# Patient Record
Sex: Female | Born: 1940
Health system: Southern US, Community
[De-identification: ages and names within clinical notes are randomized; demographics above are authoritative.]

## PROBLEM LIST (undated history)

## (undated) DIAGNOSIS — K219 Gastro-esophageal reflux disease without esophagitis: Secondary | ICD-10-CM

## (undated) DIAGNOSIS — E1142 Type 2 diabetes mellitus with diabetic polyneuropathy: Secondary | ICD-10-CM

## (undated) DIAGNOSIS — E669 Obesity, unspecified: Secondary | ICD-10-CM

## (undated) DIAGNOSIS — M722 Plantar fascial fibromatosis: Secondary | ICD-10-CM

## (undated) DIAGNOSIS — E0590 Thyrotoxicosis, unspecified without thyrotoxic crisis or storm: Secondary | ICD-10-CM

## (undated) DIAGNOSIS — L719 Rosacea, unspecified: Secondary | ICD-10-CM

## (undated) DIAGNOSIS — Z86718 Personal history of other venous thrombosis and embolism: Secondary | ICD-10-CM

## (undated) DIAGNOSIS — R269 Unspecified abnormalities of gait and mobility: Secondary | ICD-10-CM

## (undated) DIAGNOSIS — G4700 Insomnia, unspecified: Secondary | ICD-10-CM

## (undated) DIAGNOSIS — D496 Neoplasm of unspecified behavior of brain: Secondary | ICD-10-CM

## (undated) DIAGNOSIS — M159 Polyosteoarthritis, unspecified: Secondary | ICD-10-CM

## (undated) DIAGNOSIS — H33312 Horseshoe tear of retina without detachment, left eye: Secondary | ICD-10-CM

## (undated) DIAGNOSIS — D259 Leiomyoma of uterus, unspecified: Secondary | ICD-10-CM

## (undated) DIAGNOSIS — I10 Essential (primary) hypertension: Secondary | ICD-10-CM

## (undated) DIAGNOSIS — R2689 Other abnormalities of gait and mobility: Secondary | ICD-10-CM

## (undated) DIAGNOSIS — I480 Paroxysmal atrial fibrillation: Secondary | ICD-10-CM

## (undated) DIAGNOSIS — Z9289 Personal history of other medical treatment: Secondary | ICD-10-CM

## (undated) HISTORY — DX: Personal history of other medical treatment: Z92.89

## (undated) HISTORY — DX: Obesity, unspecified: E66.9

## (undated) HISTORY — PX: CATARACT EXTRACTION: SUR2

## (undated) HISTORY — PX: CARPAL TUNNEL RELEASE: SHX101

## (undated) HISTORY — DX: Plantar fascial fibromatosis: M72.2

## (undated) HISTORY — DX: Horseshoe tear of retina without detachment, left eye: H33.312

## (undated) HISTORY — DX: Leiomyoma of uterus, unspecified: D25.9

## (undated) HISTORY — DX: Gastro-esophageal reflux disease without esophagitis: K21.9

## (undated) HISTORY — DX: Thyrotoxicosis, unspecified without thyrotoxic crisis or storm: E05.90

## (undated) HISTORY — DX: Polyosteoarthritis, unspecified: M15.9

## (undated) HISTORY — DX: Essential (primary) hypertension: I10

## (undated) HISTORY — DX: Insomnia, unspecified: G47.00

## (undated) HISTORY — DX: Paroxysmal atrial fibrillation: I48.0

## (undated) HISTORY — DX: Neoplasm of unspecified behavior of brain: D49.6

## (undated) HISTORY — PX: RETINAL TEAR REPAIR CRYOTHERAPY: SHX5304

## (undated) HISTORY — DX: Rosacea, unspecified: L71.9

## (undated) HISTORY — DX: Other abnormalities of gait and mobility: R26.89

## (undated) HISTORY — DX: Type 2 diabetes mellitus with diabetic polyneuropathy: E11.42

## (undated) HISTORY — DX: Unspecified abnormalities of gait and mobility: R26.9

## (undated) HISTORY — DX: Personal history of other venous thrombosis and embolism: Z86.718

---

## 1997-11-05 ENCOUNTER — Ambulatory Visit (HOSPITAL_COMMUNITY): Admission: RE | Admit: 1997-11-05 | Discharge: 1997-11-05 | Payer: Self-pay | Admitting: Obstetrics & Gynecology

## 1998-06-17 ENCOUNTER — Other Ambulatory Visit: Admission: RE | Admit: 1998-06-17 | Discharge: 1998-06-17 | Payer: Self-pay | Admitting: *Deleted

## 1999-05-19 ENCOUNTER — Ambulatory Visit (HOSPITAL_COMMUNITY): Admission: RE | Admit: 1999-05-19 | Discharge: 1999-05-19 | Payer: Self-pay | Admitting: Pulmonary Disease

## 1999-07-21 ENCOUNTER — Other Ambulatory Visit: Admission: RE | Admit: 1999-07-21 | Discharge: 1999-07-21 | Payer: Self-pay | Admitting: *Deleted

## 2000-08-14 ENCOUNTER — Other Ambulatory Visit: Admission: RE | Admit: 2000-08-14 | Discharge: 2000-08-14 | Payer: Self-pay | Admitting: *Deleted

## 2001-11-01 ENCOUNTER — Other Ambulatory Visit: Admission: RE | Admit: 2001-11-01 | Discharge: 2001-11-01 | Payer: Self-pay | Admitting: *Deleted

## 2002-11-15 ENCOUNTER — Other Ambulatory Visit: Admission: RE | Admit: 2002-11-15 | Discharge: 2002-11-15 | Payer: Self-pay | Admitting: *Deleted

## 2003-02-14 ENCOUNTER — Ambulatory Visit (HOSPITAL_COMMUNITY): Admission: RE | Admit: 2003-02-14 | Discharge: 2003-02-14 | Payer: Self-pay | Admitting: *Deleted

## 2004-02-16 ENCOUNTER — Other Ambulatory Visit: Admission: RE | Admit: 2004-02-16 | Discharge: 2004-02-16 | Payer: Self-pay | Admitting: *Deleted

## 2005-01-25 ENCOUNTER — Encounter: Admission: RE | Admit: 2005-01-25 | Discharge: 2005-04-25 | Payer: Self-pay | Admitting: *Deleted

## 2005-03-01 ENCOUNTER — Other Ambulatory Visit: Admission: RE | Admit: 2005-03-01 | Discharge: 2005-03-01 | Payer: Self-pay | Admitting: *Deleted

## 2006-03-03 ENCOUNTER — Other Ambulatory Visit: Admission: RE | Admit: 2006-03-03 | Discharge: 2006-03-03 | Payer: Self-pay | Admitting: Obstetrics & Gynecology

## 2007-03-14 ENCOUNTER — Other Ambulatory Visit: Admission: RE | Admit: 2007-03-14 | Discharge: 2007-03-14 | Payer: Self-pay | Admitting: Obstetrics & Gynecology

## 2009-10-26 ENCOUNTER — Encounter: Admission: RE | Admit: 2009-10-26 | Discharge: 2009-10-26 | Payer: Self-pay | Admitting: Family Medicine

## 2009-11-01 ENCOUNTER — Emergency Department (HOSPITAL_COMMUNITY): Admission: EM | Admit: 2009-11-01 | Discharge: 2009-11-01 | Payer: Self-pay | Admitting: Emergency Medicine

## 2009-11-06 ENCOUNTER — Encounter: Admission: RE | Admit: 2009-11-06 | Discharge: 2009-11-06 | Payer: Self-pay | Admitting: Family Medicine

## 2010-06-15 ENCOUNTER — Encounter: Admission: RE | Admit: 2010-06-15 | Discharge: 2010-07-15 | Payer: Self-pay | Admitting: Family Medicine

## 2010-12-16 LAB — BASIC METABOLIC PANEL
BUN: 16 mg/dL (ref 6–23)
CO2: 29 mEq/L (ref 19–32)
Calcium: 9.6 mg/dL (ref 8.4–10.5)
Chloride: 102 mEq/L (ref 96–112)
Creatinine, Ser: 0.76 mg/dL (ref 0.4–1.2)
GFR calc Af Amer: >60 mL/min (ref >60)
GFR calc non Af Amer: >60 mL/min (ref >60)
Glucose, Bld: 148 mg/dL — ABNORMAL HIGH (ref 70–99)
Potassium: 3.7 mEq/L (ref 3.5–5.1)
Sodium: 140 mEq/L (ref 135–145)

## 2010-12-16 LAB — POCT CARDIAC MARKERS
CKMB, poc: 1.6 ng/mL (ref 1.0–8.0)
Myoglobin, poc: 50.1 ng/mL (ref 12–200)
Troponin i, poc: <0.05 ng/mL (ref 0.00–0.09)

## 2011-10-12 DIAGNOSIS — B9789 Other viral agents as the cause of diseases classified elsewhere: Secondary | ICD-10-CM | POA: Diagnosis not present

## 2011-10-27 DIAGNOSIS — Z79899 Other long term (current) drug therapy: Secondary | ICD-10-CM | POA: Diagnosis not present

## 2011-10-27 DIAGNOSIS — R1031 Right lower quadrant pain: Secondary | ICD-10-CM | POA: Diagnosis not present

## 2011-10-27 DIAGNOSIS — N390 Urinary tract infection, site not specified: Secondary | ICD-10-CM | POA: Diagnosis not present

## 2011-10-27 DIAGNOSIS — IMO0001 Reserved for inherently not codable concepts without codable children: Secondary | ICD-10-CM | POA: Diagnosis not present

## 2011-11-01 DIAGNOSIS — E119 Type 2 diabetes mellitus without complications: Secondary | ICD-10-CM | POA: Diagnosis not present

## 2011-11-01 DIAGNOSIS — I10 Essential (primary) hypertension: Secondary | ICD-10-CM | POA: Diagnosis not present

## 2011-11-01 DIAGNOSIS — R059 Cough, unspecified: Secondary | ICD-10-CM | POA: Diagnosis not present

## 2012-01-04 DIAGNOSIS — J301 Allergic rhinitis due to pollen: Secondary | ICD-10-CM | POA: Diagnosis not present

## 2012-01-04 DIAGNOSIS — K219 Gastro-esophageal reflux disease without esophagitis: Secondary | ICD-10-CM | POA: Diagnosis not present

## 2012-01-04 DIAGNOSIS — R490 Dysphonia: Secondary | ICD-10-CM | POA: Diagnosis not present

## 2012-01-18 DIAGNOSIS — K219 Gastro-esophageal reflux disease without esophagitis: Secondary | ICD-10-CM | POA: Diagnosis not present

## 2012-01-18 DIAGNOSIS — J301 Allergic rhinitis due to pollen: Secondary | ICD-10-CM | POA: Diagnosis not present

## 2012-01-18 DIAGNOSIS — R490 Dysphonia: Secondary | ICD-10-CM | POA: Diagnosis not present

## 2012-02-28 DIAGNOSIS — R490 Dysphonia: Secondary | ICD-10-CM | POA: Diagnosis not present

## 2012-02-28 DIAGNOSIS — K219 Gastro-esophageal reflux disease without esophagitis: Secondary | ICD-10-CM | POA: Diagnosis not present

## 2012-03-07 DIAGNOSIS — IMO0001 Reserved for inherently not codable concepts without codable children: Secondary | ICD-10-CM | POA: Diagnosis not present

## 2012-03-26 DIAGNOSIS — L570 Actinic keratosis: Secondary | ICD-10-CM | POA: Diagnosis not present

## 2012-03-26 DIAGNOSIS — L719 Rosacea, unspecified: Secondary | ICD-10-CM | POA: Diagnosis not present

## 2012-03-26 DIAGNOSIS — D235 Other benign neoplasm of skin of trunk: Secondary | ICD-10-CM | POA: Diagnosis not present

## 2012-05-29 DIAGNOSIS — Z1231 Encounter for screening mammogram for malignant neoplasm of breast: Secondary | ICD-10-CM | POA: Diagnosis not present

## 2012-06-21 DIAGNOSIS — E119 Type 2 diabetes mellitus without complications: Secondary | ICD-10-CM | POA: Diagnosis not present

## 2012-06-21 DIAGNOSIS — E782 Mixed hyperlipidemia: Secondary | ICD-10-CM | POA: Diagnosis not present

## 2012-08-06 DIAGNOSIS — K219 Gastro-esophageal reflux disease without esophagitis: Secondary | ICD-10-CM | POA: Diagnosis not present

## 2012-08-06 DIAGNOSIS — J301 Allergic rhinitis due to pollen: Secondary | ICD-10-CM | POA: Diagnosis not present

## 2012-08-06 DIAGNOSIS — R490 Dysphonia: Secondary | ICD-10-CM | POA: Diagnosis not present

## 2012-08-13 DIAGNOSIS — R05 Cough: Secondary | ICD-10-CM | POA: Diagnosis not present

## 2012-08-13 DIAGNOSIS — R059 Cough, unspecified: Secondary | ICD-10-CM | POA: Diagnosis not present

## 2012-08-20 DIAGNOSIS — K219 Gastro-esophageal reflux disease without esophagitis: Secondary | ICD-10-CM | POA: Diagnosis not present

## 2012-08-20 DIAGNOSIS — J310 Chronic rhinitis: Secondary | ICD-10-CM | POA: Diagnosis not present

## 2012-08-20 DIAGNOSIS — R059 Cough, unspecified: Secondary | ICD-10-CM | POA: Diagnosis not present

## 2012-08-20 DIAGNOSIS — R002 Palpitations: Secondary | ICD-10-CM | POA: Diagnosis not present

## 2012-08-20 DIAGNOSIS — R05 Cough: Secondary | ICD-10-CM | POA: Diagnosis not present

## 2012-08-28 DIAGNOSIS — Z124 Encounter for screening for malignant neoplasm of cervix: Secondary | ICD-10-CM | POA: Diagnosis not present

## 2012-08-28 DIAGNOSIS — Z01419 Encounter for gynecological examination (general) (routine) without abnormal findings: Secondary | ICD-10-CM | POA: Diagnosis not present

## 2012-09-04 DIAGNOSIS — R490 Dysphonia: Secondary | ICD-10-CM | POA: Diagnosis not present

## 2012-09-10 DIAGNOSIS — E119 Type 2 diabetes mellitus without complications: Secondary | ICD-10-CM | POA: Diagnosis not present

## 2012-10-23 DIAGNOSIS — Z79899 Other long term (current) drug therapy: Secondary | ICD-10-CM | POA: Diagnosis not present

## 2012-10-23 DIAGNOSIS — H9319 Tinnitus, unspecified ear: Secondary | ICD-10-CM | POA: Diagnosis not present

## 2012-10-23 DIAGNOSIS — E782 Mixed hyperlipidemia: Secondary | ICD-10-CM | POA: Diagnosis not present

## 2012-10-23 DIAGNOSIS — M2550 Pain in unspecified joint: Secondary | ICD-10-CM | POA: Diagnosis not present

## 2012-10-23 DIAGNOSIS — E119 Type 2 diabetes mellitus without complications: Secondary | ICD-10-CM | POA: Diagnosis not present

## 2012-10-23 DIAGNOSIS — Z23 Encounter for immunization: Secondary | ICD-10-CM | POA: Diagnosis not present

## 2012-10-23 DIAGNOSIS — G479 Sleep disorder, unspecified: Secondary | ICD-10-CM | POA: Diagnosis not present

## 2012-10-25 DIAGNOSIS — H33309 Unspecified retinal break, unspecified eye: Secondary | ICD-10-CM | POA: Diagnosis not present

## 2012-10-25 DIAGNOSIS — H43819 Vitreous degeneration, unspecified eye: Secondary | ICD-10-CM | POA: Diagnosis not present

## 2012-10-26 DIAGNOSIS — H9319 Tinnitus, unspecified ear: Secondary | ICD-10-CM | POA: Diagnosis not present

## 2012-10-26 DIAGNOSIS — R42 Dizziness and giddiness: Secondary | ICD-10-CM | POA: Diagnosis not present

## 2012-10-26 DIAGNOSIS — H903 Sensorineural hearing loss, bilateral: Secondary | ICD-10-CM | POA: Diagnosis not present

## 2012-11-02 DIAGNOSIS — H9319 Tinnitus, unspecified ear: Secondary | ICD-10-CM | POA: Diagnosis not present

## 2012-11-29 DIAGNOSIS — M76899 Other specified enthesopathies of unspecified lower limb, excluding foot: Secondary | ICD-10-CM | POA: Diagnosis not present

## 2012-11-29 DIAGNOSIS — M25559 Pain in unspecified hip: Secondary | ICD-10-CM | POA: Diagnosis not present

## 2012-12-17 DIAGNOSIS — M25559 Pain in unspecified hip: Secondary | ICD-10-CM | POA: Diagnosis not present

## 2012-12-17 DIAGNOSIS — M76899 Other specified enthesopathies of unspecified lower limb, excluding foot: Secondary | ICD-10-CM | POA: Diagnosis not present

## 2012-12-19 DIAGNOSIS — M25559 Pain in unspecified hip: Secondary | ICD-10-CM | POA: Diagnosis not present

## 2012-12-19 DIAGNOSIS — M76899 Other specified enthesopathies of unspecified lower limb, excluding foot: Secondary | ICD-10-CM | POA: Diagnosis not present

## 2012-12-31 DIAGNOSIS — L570 Actinic keratosis: Secondary | ICD-10-CM | POA: Diagnosis not present

## 2012-12-31 DIAGNOSIS — L821 Other seborrheic keratosis: Secondary | ICD-10-CM | POA: Diagnosis not present

## 2012-12-31 DIAGNOSIS — D235 Other benign neoplasm of skin of trunk: Secondary | ICD-10-CM | POA: Diagnosis not present

## 2013-01-09 DIAGNOSIS — E782 Mixed hyperlipidemia: Secondary | ICD-10-CM | POA: Diagnosis not present

## 2013-01-09 DIAGNOSIS — Z79899 Other long term (current) drug therapy: Secondary | ICD-10-CM | POA: Diagnosis not present

## 2013-01-14 DIAGNOSIS — M545 Low back pain, unspecified: Secondary | ICD-10-CM | POA: Diagnosis not present

## 2013-01-14 DIAGNOSIS — M25559 Pain in unspecified hip: Secondary | ICD-10-CM | POA: Diagnosis not present

## 2013-01-15 ENCOUNTER — Other Ambulatory Visit: Payer: Self-pay | Admitting: Family Medicine

## 2013-01-15 DIAGNOSIS — M545 Low back pain, unspecified: Secondary | ICD-10-CM

## 2013-01-22 ENCOUNTER — Other Ambulatory Visit: Payer: Self-pay

## 2013-01-25 ENCOUNTER — Ambulatory Visit
Admission: RE | Admit: 2013-01-25 | Discharge: 2013-01-25 | Disposition: A | Discharge status: Home / Self Care | Payer: Medicare Other | Source: Ambulatory Visit | Attending: Family Medicine | Admitting: Family Medicine

## 2013-01-25 DIAGNOSIS — M5126 Other intervertebral disc displacement, lumbar region: Secondary | ICD-10-CM | POA: Diagnosis not present

## 2013-01-25 DIAGNOSIS — M545 Low back pain, unspecified: Secondary | ICD-10-CM

## 2013-01-25 DIAGNOSIS — M47817 Spondylosis without myelopathy or radiculopathy, lumbosacral region: Secondary | ICD-10-CM | POA: Diagnosis not present

## 2013-01-25 DIAGNOSIS — M4310 Spondylolisthesis, site unspecified: Secondary | ICD-10-CM | POA: Diagnosis not present

## 2013-02-04 DIAGNOSIS — M76899 Other specified enthesopathies of unspecified lower limb, excluding foot: Secondary | ICD-10-CM | POA: Diagnosis not present

## 2013-02-04 DIAGNOSIS — M5137 Other intervertebral disc degeneration, lumbosacral region: Secondary | ICD-10-CM | POA: Diagnosis not present

## 2013-02-07 DIAGNOSIS — M76899 Other specified enthesopathies of unspecified lower limb, excluding foot: Secondary | ICD-10-CM | POA: Diagnosis not present

## 2013-02-07 DIAGNOSIS — M5137 Other intervertebral disc degeneration, lumbosacral region: Secondary | ICD-10-CM | POA: Diagnosis not present

## 2013-02-10 DIAGNOSIS — L03119 Cellulitis of unspecified part of limb: Secondary | ICD-10-CM | POA: Diagnosis not present

## 2013-02-10 DIAGNOSIS — L02419 Cutaneous abscess of limb, unspecified: Secondary | ICD-10-CM | POA: Diagnosis not present

## 2013-02-19 DIAGNOSIS — S81009A Unspecified open wound, unspecified knee, initial encounter: Secondary | ICD-10-CM | POA: Diagnosis not present

## 2013-02-19 DIAGNOSIS — S91009A Unspecified open wound, unspecified ankle, initial encounter: Secondary | ICD-10-CM | POA: Diagnosis not present

## 2013-04-03 DIAGNOSIS — E782 Mixed hyperlipidemia: Secondary | ICD-10-CM | POA: Diagnosis not present

## 2013-04-03 DIAGNOSIS — E119 Type 2 diabetes mellitus without complications: Secondary | ICD-10-CM | POA: Diagnosis not present

## 2013-04-17 DIAGNOSIS — E119 Type 2 diabetes mellitus without complications: Secondary | ICD-10-CM | POA: Diagnosis not present

## 2013-05-15 DIAGNOSIS — M25559 Pain in unspecified hip: Secondary | ICD-10-CM | POA: Diagnosis not present

## 2013-05-15 DIAGNOSIS — M545 Low back pain, unspecified: Secondary | ICD-10-CM | POA: Diagnosis not present

## 2013-05-21 ENCOUNTER — Ambulatory Visit: Payer: Medicare Other | Attending: Family Medicine | Admitting: Physical Therapy

## 2013-05-21 DIAGNOSIS — M545 Low back pain, unspecified: Secondary | ICD-10-CM | POA: Insufficient documentation

## 2013-05-21 DIAGNOSIS — M25569 Pain in unspecified knee: Secondary | ICD-10-CM | POA: Insufficient documentation

## 2013-05-21 DIAGNOSIS — M25559 Pain in unspecified hip: Secondary | ICD-10-CM | POA: Insufficient documentation

## 2013-05-21 DIAGNOSIS — IMO0001 Reserved for inherently not codable concepts without codable children: Secondary | ICD-10-CM | POA: Insufficient documentation

## 2013-05-23 ENCOUNTER — Ambulatory Visit: Payer: Medicare Other | Admitting: Physical Therapy

## 2013-05-28 ENCOUNTER — Ambulatory Visit: Payer: Medicare Other | Attending: Family Medicine | Admitting: Physical Therapy

## 2013-05-28 DIAGNOSIS — M25559 Pain in unspecified hip: Secondary | ICD-10-CM | POA: Insufficient documentation

## 2013-05-28 DIAGNOSIS — IMO0001 Reserved for inherently not codable concepts without codable children: Secondary | ICD-10-CM | POA: Insufficient documentation

## 2013-05-28 DIAGNOSIS — M545 Low back pain, unspecified: Secondary | ICD-10-CM | POA: Insufficient documentation

## 2013-05-28 DIAGNOSIS — M25569 Pain in unspecified knee: Secondary | ICD-10-CM | POA: Diagnosis not present

## 2013-05-29 ENCOUNTER — Encounter: Payer: Medicare Other | Admitting: Physical Therapy

## 2013-05-30 ENCOUNTER — Ambulatory Visit: Payer: Medicare Other | Admitting: Physical Therapy

## 2013-06-04 ENCOUNTER — Ambulatory Visit: Payer: Medicare Other | Admitting: Physical Therapy

## 2013-06-06 ENCOUNTER — Ambulatory Visit: Payer: Medicare Other | Admitting: Physical Therapy

## 2013-06-10 ENCOUNTER — Ambulatory Visit: Payer: Medicare Other | Admitting: Physical Therapy

## 2013-06-11 ENCOUNTER — Telehealth: Payer: Self-pay | Admitting: Orthopedic Surgery

## 2013-06-11 NOTE — Telephone Encounter (Signed)
LM on pt's VM confirming name that she has been approved by ins co for vivelle dot patch, good through 09-25-13. Advised she can get refilled at pharmacy. Pt to call back if she needs more refills sent in.

## 2013-06-12 ENCOUNTER — Ambulatory Visit: Payer: Medicare Other | Admitting: Physical Therapy

## 2013-06-13 DIAGNOSIS — H33309 Unspecified retinal break, unspecified eye: Secondary | ICD-10-CM | POA: Diagnosis not present

## 2013-06-13 NOTE — Telephone Encounter (Signed)
Patient calling for clarification. States that she picks up medications from [facility] and has no issues.

## 2013-06-17 ENCOUNTER — Telehealth: Payer: Self-pay | Admitting: Emergency Medicine

## 2013-06-17 NOTE — Telephone Encounter (Signed)
Calling patient to notify that Prior Authorization has been approved by [facility] for Vivelle Dot 0.025 mg Patches.  No answer on phone provider. Will have to try again.

## 2013-06-18 ENCOUNTER — Encounter: Payer: Medicare Other | Admitting: Physical Therapy

## 2013-06-20 ENCOUNTER — Ambulatory Visit: Payer: Medicare Other | Admitting: Physical Therapy

## 2013-06-25 ENCOUNTER — Ambulatory Visit: Payer: Medicare Other | Admitting: Physical Therapy

## 2013-06-27 ENCOUNTER — Ambulatory Visit: Payer: Medicare Other | Admitting: Physical Therapy

## 2013-07-17 ENCOUNTER — Ambulatory Visit: Payer: Medicare Other | Attending: Family Medicine | Admitting: Physical Therapy

## 2013-07-17 ENCOUNTER — Ambulatory Visit: Payer: Medicare Other | Admitting: Physical Therapy

## 2013-07-17 DIAGNOSIS — M545 Low back pain, unspecified: Secondary | ICD-10-CM | POA: Diagnosis not present

## 2013-07-17 DIAGNOSIS — M25559 Pain in unspecified hip: Secondary | ICD-10-CM | POA: Insufficient documentation

## 2013-07-17 DIAGNOSIS — M25569 Pain in unspecified knee: Secondary | ICD-10-CM | POA: Diagnosis not present

## 2013-07-17 DIAGNOSIS — IMO0001 Reserved for inherently not codable concepts without codable children: Secondary | ICD-10-CM | POA: Insufficient documentation

## 2013-07-17 DIAGNOSIS — I10 Essential (primary) hypertension: Secondary | ICD-10-CM | POA: Diagnosis not present

## 2013-07-19 DIAGNOSIS — I10 Essential (primary) hypertension: Secondary | ICD-10-CM | POA: Diagnosis not present

## 2013-07-19 DIAGNOSIS — E7800 Pure hypercholesterolemia, unspecified: Secondary | ICD-10-CM | POA: Diagnosis not present

## 2013-07-19 DIAGNOSIS — Z1331 Encounter for screening for depression: Secondary | ICD-10-CM | POA: Diagnosis not present

## 2013-07-19 DIAGNOSIS — Z23 Encounter for immunization: Secondary | ICD-10-CM | POA: Diagnosis not present

## 2013-07-19 DIAGNOSIS — E119 Type 2 diabetes mellitus without complications: Secondary | ICD-10-CM | POA: Diagnosis not present

## 2013-10-28 DIAGNOSIS — Z1231 Encounter for screening mammogram for malignant neoplasm of breast: Secondary | ICD-10-CM | POA: Diagnosis not present

## 2013-10-28 DIAGNOSIS — E119 Type 2 diabetes mellitus without complications: Secondary | ICD-10-CM | POA: Diagnosis not present

## 2013-10-29 DIAGNOSIS — R079 Chest pain, unspecified: Secondary | ICD-10-CM | POA: Diagnosis not present

## 2013-11-12 ENCOUNTER — Other Ambulatory Visit: Payer: Self-pay | Admitting: Obstetrics & Gynecology

## 2013-11-12 NOTE — Telephone Encounter (Signed)
Pt is requesting a refill for Vivelle dot.

## 2013-11-12 NOTE — Telephone Encounter (Signed)
Last AEX and refill 09/07/12 x 1 year Next appt 11/29/13 Last MMG 05/2012  Will refill until appt. 11/29/2013  - LM for patient notifying we sent rx to pharmacy.

## 2013-11-27 ENCOUNTER — Telehealth: Payer: Self-pay

## 2013-11-27 NOTE — Telephone Encounter (Signed)
Patient AEX rescheduled//kn

## 2013-11-27 NOTE — Telephone Encounter (Signed)
lmtcb needs to reschedule AEX due to jury duty//kn

## 2013-11-28 ENCOUNTER — Ambulatory Visit: Payer: Self-pay | Admitting: Obstetrics & Gynecology

## 2013-11-29 ENCOUNTER — Ambulatory Visit: Payer: Self-pay | Admitting: Obstetrics & Gynecology

## 2013-12-09 DIAGNOSIS — H18519 Endothelial corneal dystrophy, unspecified eye: Secondary | ICD-10-CM | POA: Diagnosis not present

## 2013-12-09 DIAGNOSIS — H18599 Other hereditary corneal dystrophies, unspecified eye: Secondary | ICD-10-CM | POA: Diagnosis not present

## 2013-12-09 DIAGNOSIS — H33309 Unspecified retinal break, unspecified eye: Secondary | ICD-10-CM | POA: Diagnosis not present

## 2013-12-09 DIAGNOSIS — E119 Type 2 diabetes mellitus without complications: Secondary | ICD-10-CM | POA: Diagnosis not present

## 2013-12-12 DIAGNOSIS — H33309 Unspecified retinal break, unspecified eye: Secondary | ICD-10-CM | POA: Diagnosis not present

## 2013-12-23 DIAGNOSIS — L570 Actinic keratosis: Secondary | ICD-10-CM | POA: Diagnosis not present

## 2013-12-23 DIAGNOSIS — D235 Other benign neoplasm of skin of trunk: Secondary | ICD-10-CM | POA: Diagnosis not present

## 2013-12-24 ENCOUNTER — Encounter: Payer: Self-pay | Admitting: Obstetrics & Gynecology

## 2013-12-24 ENCOUNTER — Ambulatory Visit (INDEPENDENT_AMBULATORY_CARE_PROVIDER_SITE_OTHER): Payer: Medicare Other | Admitting: Obstetrics & Gynecology

## 2013-12-24 VITALS — BP 140/78 | HR 80 | Resp 20 | Ht 65.75 in | Wt 202.6 lb

## 2013-12-24 DIAGNOSIS — Z01419 Encounter for gynecological examination (general) (routine) without abnormal findings: Secondary | ICD-10-CM | POA: Diagnosis not present

## 2013-12-24 DIAGNOSIS — Z124 Encounter for screening for malignant neoplasm of cervix: Secondary | ICD-10-CM

## 2013-12-24 DIAGNOSIS — H33309 Unspecified retinal break, unspecified eye: Secondary | ICD-10-CM | POA: Diagnosis not present

## 2013-12-24 MED ORDER — ESTRADIOL 0.025 MG/24HR TD PTTW: 1.0000 | MEDICATED_PATCH | TRANSDERMAL | Status: DC

## 2013-12-24 NOTE — Patient Instructions (Signed)

## 2013-12-24 NOTE — Progress Notes (Signed)
73 y.o. [place].Hedger MarriedCaucasianF here for annual exam.  No vaginal bleeding.  Using 1/2 patch Vivelle dot 0.025mg .  Doing well.  Sleeps so much better with this.  Does not want to stop.  Aware of risks of DVT/PE/stroke/breast cancer.    Blood work in February, 2014.  Reports HbA1C was elevated.  Medications changed.  Has follow-up scheduled in April.  Working on some weight loss.    Patient's last menstrual period was 09/26/1997.          Sexually active: no  The current method of family planning is none.    Exercising: yes  walking, [employer] aerobics Smoker:  no  Health Maintenance: Pap:  08/04/11-WNL History of abnormal Pap:  no MMG:  10/28/13-normal, Grade A breast density Colonoscopy:  2004/2005.  Pt knows is due.  Has gotten two letters.  She will call for an appt. BMD:   2008-normal TDaP:  1/14 Screening Labs: PCP, Hb today: PCP, Urine today: PCP   reports that she has quit smoking. She has never used smokeless tobacco. She reports that she drinks about 0.5 ounces of alcohol per week. She reports that she does not use illicit drugs.  Past Medical History  Diagnosis Date  . Uterine fibroid   . Diabetes   . Insomnia   . Rosacea   . Hypertension   . Retinal tear of left eye     Past Surgical History  Procedure Laterality Date  . Cataract extraction    . Retinal tear repair cryotherapy      10/09/13, then again 3/15    Current Outpatient Prescriptions  Medication Sig Dispense Refill  . celecoxib (CELEBREX) 200 MG capsule       . glimepiride (AMARYL) 1 MG tablet       . Hypromellose (GENTEAL OP) Apply to eye.      . metoprolol succinate (TOPROL-XL) 25 MG 24 hr tablet       . Multiple Vitamins-Minerals (MULTIVITAMIN PO) Take by mouth. Centrum Silver chewable      . pioglitazone (ACTOS) 45 MG tablet       . valsartan-hydrochlorothiazide (DIOVAN-HCT) 160-12.5 MG per tablet       . VIVELLE-DOT 0.025 MG/24HR PLACE 1 NEW PATCH TO SKINTWICE A WEEK  8 patch  0  . acetaminophen  (TYLENOL) 500 MG tablet Take 500 mg by mouth every 6 (six) hours as needed.      . cycloSPORINE (RESTASIS) 0.05 % ophthalmic emulsion 1 drop 2 (two) times daily.      . fexofenadine (ALLEGRA) 180 MG tablet Take 180 mg by mouth daily.      Marland Kitchen LOVASTATIN PO Take by mouth.       No current facility-administered medications for this visit.    Family History  Problem Relation Age of Onset  . Diabetes Father   . Diabetes Other     grandmother  . Pancreatic cancer Father     ROS:  Pertinent items are noted in HPI.  Otherwise, a comprehensive ROS was negative.  Exam:   BP 140/78  Pulse 80  Resp 20  Ht 5' 5.75" (1.67 m)  Wt 202 lb 9.6 oz (91.899 kg)  BMI 32.95 kg/m2  LMP 09/26/1997  Weight change:  +13#   Height: 5' 5.75" (167 cm)  Ht Readings from Last 3 Encounters:  12/24/13 5' 5.75" (1.67 m)    General appearance: alert, cooperative and appears stated age Head: Normocephalic, without obvious abnormality, atraumatic Neck: no adenopathy, supple, symmetrical, trachea midline  and thyroid normal to inspection and palpation Lungs: clear to auscultation bilaterally Breasts: normal appearance, no masses or tenderness Heart: regular rate and rhythm Abdomen: soft, non-tender; bowel sounds normal; no masses,  no organomegaly Extremities: extremities normal, atraumatic, no cyanosis or edema Skin: Skin color, texture, turgor normal. No rashes or lesions Lymph nodes: Cervical, supraclavicular, and axillary nodes normal. No abnormal inguinal nodes palpated Neurologic: Grossly normal   Pelvic: External genitalia:  no lesions              Urethra:  normal appearing urethra with no masses, tenderness or lesions              Bartholins and Skenes: normal                 Vagina: normal appearing vagina with normal color and discharge, no lesions              Cervix: no lesions              Pap taken: yes Bimanual Exam:  Uterus:  normal size, contour, position, consistency, mobility,  non-tender              Adnexa: normal adnexa and no mass, fullness, tenderness               Rectovaginal: Confirms               Anus:  normal sphincter tone, no lesions  A:  Well Woman with normal exam PMP, no HRT H/O uterine fibroids DM insomnia  P:   Mammogram yearly Vivelle dot 0.025mg  twice weekly.  Rx to [facility]. pap smear obtained today Labs with PCP return annually or prn  An After Visit Summary was printed and given to the patient.

## 2013-12-26 LAB — IPS PAP SMEAR ONLY

## 2014-01-20 DIAGNOSIS — D128 Benign neoplasm of rectum: Secondary | ICD-10-CM | POA: Diagnosis not present

## 2014-01-20 DIAGNOSIS — Z1211 Encounter for screening for malignant neoplasm of colon: Secondary | ICD-10-CM | POA: Diagnosis not present

## 2014-01-20 DIAGNOSIS — K621 Rectal polyp: Secondary | ICD-10-CM | POA: Diagnosis not present

## 2014-01-20 DIAGNOSIS — D129 Benign neoplasm of anus and anal canal: Secondary | ICD-10-CM | POA: Diagnosis not present

## 2014-01-20 DIAGNOSIS — K620 Anal polyp: Secondary | ICD-10-CM | POA: Diagnosis not present

## 2014-01-20 DIAGNOSIS — K648 Other hemorrhoids: Secondary | ICD-10-CM | POA: Diagnosis not present

## 2014-01-27 DIAGNOSIS — E119 Type 2 diabetes mellitus without complications: Secondary | ICD-10-CM | POA: Diagnosis not present

## 2014-01-30 DIAGNOSIS — Z23 Encounter for immunization: Secondary | ICD-10-CM | POA: Diagnosis not present

## 2014-01-30 DIAGNOSIS — G5600 Carpal tunnel syndrome, unspecified upper limb: Secondary | ICD-10-CM | POA: Diagnosis not present

## 2014-01-30 DIAGNOSIS — E7800 Pure hypercholesterolemia, unspecified: Secondary | ICD-10-CM | POA: Diagnosis not present

## 2014-01-30 DIAGNOSIS — I10 Essential (primary) hypertension: Secondary | ICD-10-CM | POA: Diagnosis not present

## 2014-01-30 DIAGNOSIS — IMO0001 Reserved for inherently not codable concepts without codable children: Secondary | ICD-10-CM | POA: Diagnosis not present

## 2014-02-04 DIAGNOSIS — H33309 Unspecified retinal break, unspecified eye: Secondary | ICD-10-CM | POA: Diagnosis not present

## 2014-02-06 DIAGNOSIS — M25539 Pain in unspecified wrist: Secondary | ICD-10-CM | POA: Diagnosis not present

## 2014-02-13 DIAGNOSIS — R209 Unspecified disturbances of skin sensation: Secondary | ICD-10-CM | POA: Diagnosis not present

## 2014-02-21 DIAGNOSIS — R209 Unspecified disturbances of skin sensation: Secondary | ICD-10-CM | POA: Diagnosis not present

## 2014-03-04 DIAGNOSIS — M159 Polyosteoarthritis, unspecified: Secondary | ICD-10-CM | POA: Diagnosis not present

## 2014-03-04 DIAGNOSIS — G5600 Carpal tunnel syndrome, unspecified upper limb: Secondary | ICD-10-CM | POA: Diagnosis not present

## 2014-03-17 DIAGNOSIS — G5600 Carpal tunnel syndrome, unspecified upper limb: Secondary | ICD-10-CM | POA: Diagnosis not present

## 2014-04-14 DIAGNOSIS — H43819 Vitreous degeneration, unspecified eye: Secondary | ICD-10-CM | POA: Diagnosis not present

## 2014-05-08 DIAGNOSIS — H33339 Multiple defects of retina without detachment, unspecified eye: Secondary | ICD-10-CM | POA: Diagnosis not present

## 2014-05-08 DIAGNOSIS — H53001 Unspecified amblyopia, right eye: Secondary | ICD-10-CM | POA: Insufficient documentation

## 2014-05-08 DIAGNOSIS — Z961 Presence of intraocular lens: Secondary | ICD-10-CM | POA: Insufficient documentation

## 2014-05-08 DIAGNOSIS — H35349 Macular cyst, hole, or pseudohole, unspecified eye: Secondary | ICD-10-CM | POA: Diagnosis not present

## 2014-05-08 DIAGNOSIS — H33332 Multiple defects of retina without detachment, left eye: Secondary | ICD-10-CM | POA: Insufficient documentation

## 2014-05-08 DIAGNOSIS — E119 Type 2 diabetes mellitus without complications: Secondary | ICD-10-CM | POA: Diagnosis not present

## 2014-05-08 DIAGNOSIS — H53009 Unspecified amblyopia, unspecified eye: Secondary | ICD-10-CM | POA: Diagnosis not present

## 2014-05-13 DIAGNOSIS — G5600 Carpal tunnel syndrome, unspecified upper limb: Secondary | ICD-10-CM | POA: Diagnosis not present

## 2014-06-26 DIAGNOSIS — H33321 Round hole, right eye: Secondary | ICD-10-CM | POA: Insufficient documentation

## 2014-07-09 DIAGNOSIS — Z23 Encounter for immunization: Secondary | ICD-10-CM | POA: Diagnosis not present

## 2014-07-28 ENCOUNTER — Encounter: Payer: Self-pay | Admitting: Obstetrics & Gynecology

## 2014-07-29 DIAGNOSIS — E119 Type 2 diabetes mellitus without complications: Secondary | ICD-10-CM | POA: Diagnosis not present

## 2014-07-31 DIAGNOSIS — E119 Type 2 diabetes mellitus without complications: Secondary | ICD-10-CM | POA: Diagnosis not present

## 2014-07-31 DIAGNOSIS — I10 Essential (primary) hypertension: Secondary | ICD-10-CM | POA: Diagnosis not present

## 2014-07-31 DIAGNOSIS — E782 Mixed hyperlipidemia: Secondary | ICD-10-CM | POA: Diagnosis not present

## 2014-07-31 DIAGNOSIS — Z13820 Encounter for screening for osteoporosis: Secondary | ICD-10-CM | POA: Diagnosis not present

## 2014-09-26 DIAGNOSIS — Z86718 Personal history of other venous thrombosis and embolism: Secondary | ICD-10-CM

## 2014-09-26 HISTORY — DX: Personal history of other venous thrombosis and embolism: Z86.718

## 2014-10-20 ENCOUNTER — Telehealth: Payer: Self-pay | Admitting: Obstetrics & Gynecology

## 2014-10-20 NOTE — Telephone Encounter (Signed)
Patient wants to see Dr. Sabra Heck her urine has a foul smell and it has been this way for a while.

## 2014-10-20 NOTE — Telephone Encounter (Signed)
Spoke with patient. Patient states that for several months she has had a strong smell with urination. Denies any discomfort, urinary frequency, burning with urination, back pain, fevers, chills, or irritation. "It just smells really icky. I have had it on my mind and want to come to get it checked out." Appointment scheduled for tomorrow at 12:45pm with Regina Eck CNM. Agreeable to date and time and to see NP.  Cc: Dr.Miller  Routing to provider for final review. Patient agreeable to disposition. Will close encounter

## 2014-10-21 ENCOUNTER — Encounter: Payer: Self-pay | Admitting: Certified Nurse Midwife

## 2014-10-21 ENCOUNTER — Ambulatory Visit (INDEPENDENT_AMBULATORY_CARE_PROVIDER_SITE_OTHER): Payer: Medicare Other | Admitting: Certified Nurse Midwife

## 2014-10-21 VITALS — BP 130/68 | HR 72 | Temp 97.6°F | Resp 16 | Ht 65.75 in | Wt 200.0 lb

## 2014-10-21 DIAGNOSIS — B372 Candidiasis of skin and nail: Secondary | ICD-10-CM | POA: Diagnosis not present

## 2014-10-21 DIAGNOSIS — N9489 Other specified conditions associated with female genital organs and menstrual cycle: Secondary | ICD-10-CM | POA: Diagnosis not present

## 2014-10-21 DIAGNOSIS — R8290 Unspecified abnormal findings in urine: Secondary | ICD-10-CM | POA: Diagnosis not present

## 2014-10-21 DIAGNOSIS — N898 Other specified noninflammatory disorders of vagina: Secondary | ICD-10-CM

## 2014-10-21 LAB — POCT URINALYSIS DIPSTICK
Bilirubin, UA: NEGATIVE
Blood, UA: NEGATIVE
Glucose, UA: NEGATIVE
Ketones, UA: NEGATIVE
Leukocytes, UA: NEGATIVE
Nitrite, UA: NEGATIVE
Protein, UA: NEGATIVE
Urobilinogen, UA: NEGATIVE
pH, UA: 5

## 2014-10-21 MED ORDER — NYSTATIN 100000 UNIT/GM EX CREA: 1.0000 "application " | TOPICAL_CREAM | Freq: Two times a day (BID) | CUTANEOUS | Status: DC

## 2014-10-21 NOTE — Progress Notes (Signed)
74 y.o. married white female [phone] here with complaint of odor in vaginal or urinary area, with onset  on 6 months ago. Patient has not changed soaps uses [employer] for years. Notices odor after urination. Patient wears pads daily to make sure she is covered if needed, but no incontinence.. Patient denies any urinary frequency/urgency or pain with urination. Patient denies fever, chills, nausea or back pain. No new personal products. Patient feels not related to sexual activity. Denies any vaginal symptoms. Menopausal with no vaginal dryness uses Olive Oil with good response. Patient does not odor on pad. No bowel issues.   O: Healthy female WDWN Affect: Normal, orientation x 3 Skin : warm and dry CVAT: negative bilateral Abdomen: non tender for suprapubic tenderness  Pelvic exam: External genital area: normal, no lesions, in folds of groin scaling and white exudate noted wet prep taken Bladder,Urethra, Urethral meatus:not tender, no leaking with cough Vagina: normal vaginal discharge, normal appearance ph 4.5  Wet prep taken Cervix: normal, non tender Uterus:normal,non tender Adnexa: normal non tender, no fullness or masses  POCT urine: negative, no odor noted Wet prep vagina: negative for pathogens Wet prep: groin area positive for yeast   A:Normal pelvic exam No symptoms of UTI or incontinence Vaginal dryness uses Olive oil Wet prep in groin area positive for yeast  P: Reviewed findings of normal pelvic exam and no evidence of odor she is smelling.Discussed going without pads unless needed, to see if this will help. Decrease coffee use and increase water to see if this changes urine smell. Lab: Urine culture Discussed findings of yeast dermatitis probably from pad use. Rx Nystatin cream see order with instruction. Encouraged to run underwear through the rinse cycle twice to make sure no detergent residue, patient feels this might be the problem, due to highly scented.Patient to  advise if status changes with urinary symptoms.  NAT:FTDDU culture Reviewed warning signs and symptoms of UTI Encouraged to limit soda, tea, and coffee RV prn

## 2014-10-22 LAB — URINE CULTURE
Colony Count: NO GROWTH
Organism ID, Bacteria: NO GROWTH

## 2014-10-23 NOTE — Progress Notes (Signed)
Reviewed personally.  M. Suzanne Elyza Whitt, MD.  

## 2014-11-17 DIAGNOSIS — Z1231 Encounter for screening mammogram for malignant neoplasm of breast: Secondary | ICD-10-CM | POA: Diagnosis not present

## 2014-12-22 DIAGNOSIS — M25512 Pain in left shoulder: Secondary | ICD-10-CM | POA: Diagnosis not present

## 2015-01-27 DIAGNOSIS — R7309 Other abnormal glucose: Secondary | ICD-10-CM | POA: Diagnosis not present

## 2015-01-27 DIAGNOSIS — Z13820 Encounter for screening for osteoporosis: Secondary | ICD-10-CM | POA: Diagnosis not present

## 2015-01-27 DIAGNOSIS — E782 Mixed hyperlipidemia: Secondary | ICD-10-CM | POA: Diagnosis not present

## 2015-01-27 DIAGNOSIS — E1165 Type 2 diabetes mellitus with hyperglycemia: Secondary | ICD-10-CM | POA: Diagnosis not present

## 2015-01-27 DIAGNOSIS — E119 Type 2 diabetes mellitus without complications: Secondary | ICD-10-CM | POA: Diagnosis not present

## 2015-01-27 DIAGNOSIS — I10 Essential (primary) hypertension: Secondary | ICD-10-CM | POA: Diagnosis not present

## 2015-01-29 DIAGNOSIS — E119 Type 2 diabetes mellitus without complications: Secondary | ICD-10-CM | POA: Diagnosis not present

## 2015-01-29 DIAGNOSIS — R8290 Unspecified abnormal findings in urine: Secondary | ICD-10-CM | POA: Diagnosis not present

## 2015-01-29 DIAGNOSIS — I10 Essential (primary) hypertension: Secondary | ICD-10-CM | POA: Diagnosis not present

## 2015-02-06 DIAGNOSIS — Z961 Presence of intraocular lens: Secondary | ICD-10-CM | POA: Diagnosis not present

## 2015-02-06 DIAGNOSIS — H31002 Unspecified chorioretinal scars, left eye: Secondary | ICD-10-CM | POA: Diagnosis not present

## 2015-02-06 DIAGNOSIS — E119 Type 2 diabetes mellitus without complications: Secondary | ICD-10-CM | POA: Diagnosis not present

## 2015-02-09 ENCOUNTER — Other Ambulatory Visit: Payer: Self-pay | Admitting: Certified Nurse Midwife

## 2015-02-09 MED ORDER — ESTRADIOL 0.025 MG/24HR TD PTTW: 1.0000 | MEDICATED_PATCH | TRANSDERMAL | Status: DC

## 2015-02-09 NOTE — Telephone Encounter (Signed)
Patient calling requesting a refill on her Vivelle-Dot.  Pharmacy for this Rx is [facility].

## 2015-02-09 NOTE — Telephone Encounter (Signed)
Medication refill request: Vivelle Dot  Last AEX:  12/24/13 SM Next AEX: not schedule Last MMG (if hormonal medication request): 11/17/14 BIRADS1:Neg Refill authorized: 12/24/13 #24patch w/ 4R. Today #8patch w/ 0R?  Left voicemail to call back re: Need to schedule AEX

## 2015-02-09 NOTE — Telephone Encounter (Signed)
Please let pt know she will need AEX for any additional RFs can be done.

## 2015-02-10 NOTE — Telephone Encounter (Signed)
Left voicemail to her cell phone # for pt to call back to schedule AEX will not refill Rx until she comes in for appt. - Per DPR

## 2015-02-12 ENCOUNTER — Telehealth: Payer: Self-pay | Admitting: Obstetrics & Gynecology

## 2015-02-12 NOTE — Telephone Encounter (Signed)
Spoke with patient. Advised per note from [place] she will need to be seen for aex before she can receive further refills on her Vivelle Dot rx. Please see note below. Patient is agreeable. Aex scheduled for 6/13 at 1:30pm with Dr.Miller. Patient is agreeable to date and time. Placed on waitlist in EPIC.   Megan Salon, MD at 02/09/2015 12:32 PM     Status: Signed       Expand All Collapse All   Please let pt know she will need AEX for any additional RFs can be done.          Routing to provider for final review. Patient agreeable to disposition. Will close encounter.

## 2015-02-12 NOTE — Telephone Encounter (Signed)
Pt states she needs a refill on her medication but was told she was unable to get the request until she is seen by Dr. Sabra Heck. Pt is confused concerning the type of appointment she would need.

## 2015-03-09 ENCOUNTER — Ambulatory Visit (INDEPENDENT_AMBULATORY_CARE_PROVIDER_SITE_OTHER): Payer: Medicare Other | Admitting: Obstetrics & Gynecology

## 2015-03-09 ENCOUNTER — Encounter: Payer: Self-pay | Admitting: Obstetrics & Gynecology

## 2015-03-09 VITALS — BP 148/72 | HR 64 | Resp 16 | Ht 65.75 in | Wt 201.0 lb

## 2015-03-09 DIAGNOSIS — Z01419 Encounter for gynecological examination (general) (routine) without abnormal findings: Secondary | ICD-10-CM

## 2015-03-09 DIAGNOSIS — Z124 Encounter for screening for malignant neoplasm of cervix: Secondary | ICD-10-CM | POA: Diagnosis not present

## 2015-03-09 DIAGNOSIS — E119 Type 2 diabetes mellitus without complications: Secondary | ICD-10-CM

## 2015-03-09 MED ORDER — ESTRADIOL 0.025 MG/24HR TD PTTW: 1.0000 | MEDICATED_PATCH | TRANSDERMAL | Status: DC

## 2015-03-09 NOTE — Progress Notes (Signed)
74 y.o. [place].Hedger MarriedCaucasianF here for annual exam.  Reports doing well except thinks her urine has a strong odor.  Pt had urinalysis with Dr. Rex Kras.  This was negative.  She does wonder if it is the Glimepiride that caused the odor.  Last HbA1C was 5.4.  She may try stopping the glimepiride for a couple of weeks to see if this is the cause.  Has had recent eye exam.    Denies vaginal bleeding.  Pt is on 0.025 estradiol patch 1/2 patch twice weekly.  Still desires to continue.  Aware of risks of stroke, PE, DVT, breast cancer.    PCP:  Dr. Rex Kras.  Going every six months.   Patient's last menstrual period was 09/26/1997.          Sexually active: No.  The current method of family planning is post menopausal status.    Exercising: Yes.    walking, muscle/balance class Smoker:  Former smoker-quit in [place] Maintenance: Pap:  12/24/13 WNL History of abnormal Pap:  no MMG:  11/17/14 3D-normal Colonoscopy:  4/15, [facility].  Release will be signed today. BMD:   2008-normal TDaP:  1/14 Screening Labs: PCP, Hb today: PCP, Urine today: PCP   reports that she has quit smoking. She has never used smokeless tobacco. She reports that she drinks about 0.6 oz of alcohol per week. She reports that she does not use illicit drugs.  Past Medical History  Diagnosis Date  . Uterine fibroid   . Diabetes   . Insomnia   . Rosacea   . Hypertension   . Retinal tear of left eye     Past Surgical History  Procedure Laterality Date  . Cataract extraction    . Retinal tear repair cryotherapy      10/09/13, then again 3/15  . Carpal tunnel release      Current Outpatient Prescriptions  Medication Sig Dispense Refill  . celecoxib (CELEBREX) 200 MG capsule as needed.     Marland Kitchen estradiol (VIVELLE-DOT) 0.025 MG/24HR Place 1 patch onto the skin 2 (two) times a week. 8 patch 0  . glimepiride (AMARYL) 1 MG tablet     . metoprolol succinate (TOPROL-XL) 25 MG 24 hr tablet     . Multiple Vitamins-Minerals  (MULTIVITAMIN PO) Take by mouth. Centrum Silver chewable    . pioglitazone (ACTOS) 45 MG tablet     . valsartan-hydrochlorothiazide (DIOVAN-HCT) 160-12.5 MG per tablet      No current facility-administered medications for this visit.    Family History  Problem Relation Age of Onset  . Diabetes Father   . Diabetes Other     grandmother  . Pancreatic cancer Father     ROS:  Pertinent items are noted in HPI.  Otherwise, a comprehensive ROS was negative.  Exam:   BP 148/72 mmHg  Pulse 64  Resp 16  Ht 5' 5.75" (1.67 m)  Wt 201 lb (91.173 kg)  BMI 32.69 kg/m2  LMP 09/26/1997  Weight change: -1#  Height: 5' 5.75" (167 cm)  Ht Readings from Last 3 Encounters:  03/09/15 5' 5.75" (1.67 m)  10/21/14 5' 5.75" (1.67 m)  12/24/13 5' 5.75" (1.67 m)    General appearance: alert, cooperative and appears stated age Head: Normocephalic, without obvious abnormality, atraumatic Neck: no adenopathy, supple, symmetrical, trachea midline and thyroid normal to inspection and palpation Lungs: clear to auscultation bilaterally Breasts: normal appearance, no masses or tenderness Heart: regular rate and rhythm Abdomen: soft, non-tender; bowel sounds normal;  no masses,  no organomegaly Extremities: extremities normal, atraumatic, no cyanosis or edema Skin: Skin color, texture, turgor normal. No rashes or lesions Lymph nodes: Cervical, supraclavicular, and axillary nodes normal. No abnormal inguinal nodes palpated Neurologic: Grossly normal   Pelvic: External genitalia:  no lesions              Urethra:  normal appearing urethra with no masses, tenderness or lesions              Bartholins and Skenes: normal                 Vagina: normal appearing vagina with normal color and discharge, no lesions              Cervix: no lesions              Pap taken: No. Bimanual Exam:  Uterus:  normal size, contour, position, consistency, mobility, non-tender              Adnexa: normal adnexa and no mass,  fullness, tenderness               Rectovaginal: Confirms               Anus:  normal sphincter tone, no lesions  Chaperone was present for exam.  A:  Well Woman with normal exam PMP on low dosage HRT H/O uterine fibroids DM Insomnia Increased urinary odor with neg micro and culture 1/16  P: Mammogram yearly.  Pt and I discussed this today.  As she is desirous to stay on HRT, I would recommend her continuing HRT.   Vivelle dot 0.025mg  twice weekly. 1/2 patch twice weekly.  Rx to [facility]. pap smear obtained 2015.  No pap today. Labs with PCP.  Going every six months right now. return annually or prn

## 2015-03-12 DIAGNOSIS — R05 Cough: Secondary | ICD-10-CM | POA: Diagnosis not present

## 2015-03-19 ENCOUNTER — Emergency Department (HOSPITAL_BASED_OUTPATIENT_CLINIC_OR_DEPARTMENT_OTHER)
Admit: 2015-03-19 | Discharge: 2015-03-19 | Disposition: A | Discharge status: Home / Self Care | Payer: Medicare Other | Attending: Emergency Medicine | Admitting: Emergency Medicine

## 2015-03-19 ENCOUNTER — Emergency Department (HOSPITAL_COMMUNITY)
Admission: EM | Admit: 2015-03-19 | Discharge: 2015-03-19 | Disposition: A | Discharge status: Home / Self Care | Payer: Medicare Other | Attending: Emergency Medicine | Admitting: Emergency Medicine

## 2015-03-19 ENCOUNTER — Emergency Department (HOSPITAL_COMMUNITY): Payer: Medicare Other

## 2015-03-19 DIAGNOSIS — Z87891 Personal history of nicotine dependence: Secondary | ICD-10-CM | POA: Insufficient documentation

## 2015-03-19 DIAGNOSIS — Z8742 Personal history of other diseases of the female genital tract: Secondary | ICD-10-CM | POA: Insufficient documentation

## 2015-03-19 DIAGNOSIS — Z872 Personal history of diseases of the skin and subcutaneous tissue: Secondary | ICD-10-CM | POA: Diagnosis not present

## 2015-03-19 DIAGNOSIS — E119 Type 2 diabetes mellitus without complications: Secondary | ICD-10-CM | POA: Insufficient documentation

## 2015-03-19 DIAGNOSIS — M7989 Other specified soft tissue disorders: Secondary | ICD-10-CM

## 2015-03-19 DIAGNOSIS — Z880 Allergy status to penicillin: Secondary | ICD-10-CM | POA: Insufficient documentation

## 2015-03-19 DIAGNOSIS — M19072 Primary osteoarthritis, left ankle and foot: Secondary | ICD-10-CM | POA: Diagnosis not present

## 2015-03-19 DIAGNOSIS — Z79899 Other long term (current) drug therapy: Secondary | ICD-10-CM | POA: Insufficient documentation

## 2015-03-19 DIAGNOSIS — I10 Essential (primary) hypertension: Secondary | ICD-10-CM | POA: Diagnosis not present

## 2015-03-19 DIAGNOSIS — Z8669 Personal history of other diseases of the nervous system and sense organs: Secondary | ICD-10-CM | POA: Diagnosis not present

## 2015-03-19 DIAGNOSIS — I824Z2 Acute embolism and thrombosis of unspecified deep veins of left distal lower extremity: Secondary | ICD-10-CM | POA: Diagnosis not present

## 2015-03-19 DIAGNOSIS — I82442 Acute embolism and thrombosis of left tibial vein: Secondary | ICD-10-CM | POA: Diagnosis not present

## 2015-03-19 DIAGNOSIS — M25472 Effusion, left ankle: Secondary | ICD-10-CM | POA: Diagnosis present

## 2015-03-19 DIAGNOSIS — I82402 Acute embolism and thrombosis of unspecified deep veins of left lower extremity: Secondary | ICD-10-CM

## 2015-03-19 MED ORDER — XARELTO VTE STARTER PACK 15 & 20 MG PO TBPK: 15.0000 mg | ORAL_TABLET | ORAL | Status: DC

## 2015-03-19 NOTE — Progress Notes (Signed)
*  Preliminary Results* Left lower extremity venous duplex completed. Left lower extremity is positive for deep vein thrombosis involving the left posterior tibial and peroneal veins. There is no evidence of left Baker's cyst.  Preliminary results discussed with [name], PA-C.  03/19/2015 3:14 PM  Maudry Mayhew, RVT, RDCS, RDMS

## 2015-03-19 NOTE — ED Provider Notes (Signed)
CSN: [id number]     Arrival date & time 03/19/15  1254 History  This chart was scribed for non-physician practitioner, Angela Bible, PA-C working with Angela Biles, MD, by Angela Bentley, ED Scribe. This patient was seen in room WTR8/WTR8 and the patient's care was started at 1:40 PM.    Chief Complaint  Patient presents with  . rule out DVT    . ankle swelling      The history is provided by the patient. No language interpreter was used.   HPI Comments: Angela Bentley is a 74 y.o. female with PMHx of HTN and DM who presents to the Emergency Department complaining of worsening left ankle swelling with onset 1 week ago. Pt states pain radiates into calf with noted stiffness. Pt was seen at PCP on 03/11/15 for evaluation of a cough and presented with increased swelling in left ankle. D dimer results were slightly elevated at 0.58. Pt reports she has been on 4 short flights this month. Pt spoke with PCP today and was instructed to come to ED for a doppler. She reports she has no associated symptoms currently.  She is currently on Estrogen HRT.  Pt denies h/o DVT/ PE. She reports her sugars are controlled. Pt denies known injury, fever, chills, SOB, hemoptysis, and chest pain.   Past Medical History  Diagnosis Date  . Uterine fibroid   . Diabetes   . Insomnia   . Rosacea   . Hypertension   . Retinal tear of left eye    Past Surgical History  Procedure Laterality Date  . Cataract extraction    . Retinal tear repair cryotherapy      10/09/13, then again 3/15  . Carpal tunnel release     Family History  Problem Relation Age of Onset  . Diabetes Father   . Diabetes Other     grandmother  . Pancreatic cancer Father    History  Substance Use Topics  . Smoking status: Former Research scientist (life sciences)  . Smokeless tobacco: Never Used     Comment: quit in 1996  . Alcohol Use: 0.6 oz/week    1 Standard drinks or equivalent per week   OB History    Gravida Para Term Preterm AB TAB SAB Ectopic  Multiple Living   5 3        3      Review of Systems  Constitutional: Negative for fever and chills.  Respiratory: Negative for cough and shortness of breath.   Cardiovascular: Positive for leg swelling. Negative for chest pain and palpitations.  Musculoskeletal: Positive for myalgias and joint swelling.      Allergies  Penicillins; Sulfa antibiotics; and Azithromycin  Home Medications   Prior to Admission medications   Medication Sig Start Date End Date Taking? Authorizing Provider  Ascorbic Acid (VITAMIN C) 100 MG CHEW Chew 200 mg by mouth daily as needed (immune system support).   Yes Historical Provider, MD  celecoxib (CELEBREX) 200 MG capsule Take 200 mg by mouth as needed for mild pain or moderate pain.  11/12/13  Yes Historical Provider, MD  estradiol (VIVELLE-DOT) 0.025 MG/24HR Place 1 patch onto the skin 2 (two) times a week. Patient taking differently: Place 0.5 patches onto the skin 2 (two) times a week.  03/09/15  Yes Megan Salon, MD  glimepiride (AMARYL) 1 MG tablet Take 1 mg by mouth daily with breakfast.  12/01/13  Yes Historical Provider, MD  metoprolol succinate (TOPROL-XL) 25 MG 24 hr tablet Take 25 mg by mouth  daily.  11/09/13  Yes Historical Provider, MD  Multiple Vitamins-Minerals (MULTIVITAMIN PO) Take 1 tablet by mouth daily. Centrum Silver chewable   Yes Historical Provider, MD  pioglitazone (ACTOS) 45 MG tablet Take 45 mg by mouth daily.  10/14/13  Yes Historical Provider, MD  valsartan-hydrochlorothiazide (DIOVAN-HCT) 160-12.5 MG per tablet Take 1 tablet by mouth daily.  12/15/13  Yes Historical Provider, MD  XARELTO STARTER PACK 15 & 20 MG TBPK Take 15-20 mg by mouth as directed. Take as directed on package: Start with one 15mg  tablet by mouth twice a day with food. On Day 22, switch to one 20mg  tablet once a day with food. 03/19/15   Giorgio Chabot, PA-C   BP 152/70 mmHg  Pulse 73  Temp(Src) 98.1 F (36.7 C) (Oral)  Resp 17  SpO2 98%  LMP  09/26/1997 Physical Exam  Constitutional: She is oriented to person, place, and time. She appears well-developed and well-nourished.  HENT:  Head: Normocephalic.  Eyes: Conjunctivae are normal.  Neck: Normal range of motion. Neck supple.  Cardiovascular: Normal rate, regular rhythm and normal heart sounds.  Exam reveals no friction rub.   No murmur heard. Pulmonary/Chest: Effort normal and breath sounds normal. No respiratory distress. She has no wheezes. She has no rales.  Musculoskeletal: Normal range of motion.       Left ankle: She exhibits swelling. She exhibits normal range of motion. Achilles tendon exhibits pain. Achilles tendon exhibits no defect.  Tenderness to palpation of the distal left lower leg.  Mild swelling of the LLE when compared to the RLE. No erythema or warmth.    Neurological: She is alert and oriented to person, place, and time.  Distal sensation of all toes of the left foot intact  Skin: Skin is warm and dry.  Psychiatric: She has a normal mood and affect. Her behavior is normal.  Nursing note and vitals reviewed.   ED Course  Procedures (including critical care time) DIAGNOSTIC STUDIES: Oxygen Saturation is 99% on room air, normal by my interpretation.    COORDINATION OF CARE: 1:52 PM Discussed treatment plan with patient at beside, the patient agrees with the plan and has no further questions at this time.   Labs Review Labs Reviewed - No data to display  Imaging Review Dg Ankle Complete Left  03/19/2015   CLINICAL DATA:  Cough, LEFT ankle swelling, elevated D-dimer, increasing swelling and leg stiffness, LEFT ankle pain  EXAM: LEFT ANKLE COMPLETE - 3+ VIEW  COMPARISON:  None  FINDINGS: Diffuse soft tissue swelling.  Osseous mineralization normal.  Ankle joint space preserved.  No acute fracture, dislocation or bone destruction.  Spurring and anterior margin of distal tibia at tibiotalar joint.  IMPRESSION: Mild tibiotalar degenerative changes.  Soft  tissue swelling without acute bony abnormalities.   Electronically Signed   By: Lavonia Dana M.D.   On: 03/19/2015 14:29     EKG Interpretation None     MDM   Final diagnoses:  DVT (deep venous thrombosis), left   Patient presents today with unilateral swelling of the distal left lower leg.  No signs of infection.  Neurovascularly intact.  Doppler ultrasound was positive for DVT.  Patient given Rx for Xarelto.  Instructed to follow up with PCP.  No signs or symptoms of PE at this time.  VSS.  Feel that the patient is stable for discharge.  Return precautions given.  Patient also evaluated by Dr. Kathrynn Humble who is in agreement with the plan.  Angela Bible, PA-C 03/19/15 1947  Angela Biles, MD 03/20/15 1731

## 2015-03-19 NOTE — ED Notes (Signed)
Pt went to pcp last week for cough, pt has been on 4 flights in June, pcp noticed pts left ankle was swollen and ordered D dimer. D dimer was elevated and pcp encouraged pt to get doppler study done. Pt reports ankle swelling in increasing and leg is becoming stiff. Pain2/10. Denies SOB.

## 2015-03-20 ENCOUNTER — Encounter: Payer: Self-pay | Admitting: Obstetrics & Gynecology

## 2015-03-20 ENCOUNTER — Telehealth: Payer: Self-pay

## 2015-03-20 NOTE — Telephone Encounter (Signed)
Message     Dr. Sabra Heck, I was diagnosed yesterday with DVT and put on Zarelko. I assume now that I need to stop taking estradiol...right? Too bad I refilled the recent prescription! Out of the blue with this--literally, I flew 8 short flights from 6/9 to 6/21...and I'm assuming that precipitated the blood clot found at [facility].        Comments?        Angela Bentley

## 2015-03-20 NOTE — Telephone Encounter (Signed)
Spoke with patient regarding [employer]. Please see telephone encounter.

## 2015-03-20 NOTE — Telephone Encounter (Signed)
Spoke with patient regarding mychart message as seen below. Advised patient will need to come off all estrogen containing medications at this time. Unsafe to continue with recent diagnosis of DVT. Patient is agreeable and will stop estradiol patch at this time.  Routing to provider for final review. Patient agreeable to disposition. Will close encounter.

## 2015-03-25 DIAGNOSIS — R05 Cough: Secondary | ICD-10-CM | POA: Diagnosis not present

## 2015-03-25 DIAGNOSIS — I82402 Acute embolism and thrombosis of unspecified deep veins of left lower extremity: Secondary | ICD-10-CM | POA: Diagnosis not present

## 2015-03-26 ENCOUNTER — Other Ambulatory Visit: Payer: Self-pay | Admitting: Family Medicine

## 2015-03-26 DIAGNOSIS — R05 Cough: Secondary | ICD-10-CM

## 2015-03-26 DIAGNOSIS — R059 Cough, unspecified: Secondary | ICD-10-CM

## 2015-04-01 DIAGNOSIS — M25572 Pain in left ankle and joints of left foot: Secondary | ICD-10-CM | POA: Diagnosis not present

## 2015-04-02 ENCOUNTER — Ambulatory Visit
Admission: RE | Admit: 2015-04-02 | Discharge: 2015-04-02 | Disposition: A | Discharge status: Home / Self Care | Payer: Medicare Other | Source: Ambulatory Visit | Attending: Family Medicine | Admitting: Family Medicine

## 2015-04-02 ENCOUNTER — Other Ambulatory Visit: Payer: Medicare Other

## 2015-04-02 ENCOUNTER — Other Ambulatory Visit: Payer: Self-pay | Admitting: Family Medicine

## 2015-04-02 DIAGNOSIS — R05 Cough: Secondary | ICD-10-CM

## 2015-04-02 DIAGNOSIS — I82402 Acute embolism and thrombosis of unspecified deep veins of left lower extremity: Secondary | ICD-10-CM

## 2015-04-02 DIAGNOSIS — R791 Abnormal coagulation profile: Secondary | ICD-10-CM | POA: Diagnosis not present

## 2015-04-02 DIAGNOSIS — R059 Cough, unspecified: Secondary | ICD-10-CM

## 2015-04-02 MED ORDER — IOPAMIDOL (ISOVUE-370) INJECTION 76%
80.0000 mL | Freq: Once | INTRAVENOUS | Status: AC | PRN
  Administered 2015-04-02: 80 mL via INTRAVENOUS

## 2015-04-06 DIAGNOSIS — Z1283 Encounter for screening for malignant neoplasm of skin: Secondary | ICD-10-CM | POA: Diagnosis not present

## 2015-04-17 DIAGNOSIS — S81801A Unspecified open wound, right lower leg, initial encounter: Secondary | ICD-10-CM | POA: Diagnosis not present

## 2015-04-17 DIAGNOSIS — Z7901 Long term (current) use of anticoagulants: Secondary | ICD-10-CM | POA: Diagnosis not present

## 2015-04-17 DIAGNOSIS — Z86718 Personal history of other venous thrombosis and embolism: Secondary | ICD-10-CM | POA: Diagnosis not present

## 2015-04-17 DIAGNOSIS — M1990 Unspecified osteoarthritis, unspecified site: Secondary | ICD-10-CM | POA: Diagnosis not present

## 2015-04-17 DIAGNOSIS — S81802A Unspecified open wound, left lower leg, initial encounter: Secondary | ICD-10-CM | POA: Diagnosis not present

## 2015-05-12 ENCOUNTER — Telehealth: Payer: Self-pay | Admitting: Obstetrics & Gynecology

## 2015-05-12 DIAGNOSIS — Z7689 Persons encountering health services in other specified circumstances: Secondary | ICD-10-CM

## 2015-05-12 NOTE — Telephone Encounter (Signed)
Yes.  That is ok to place referral.

## 2015-05-12 NOTE — Telephone Encounter (Signed)
Dr. Sabra Heck, patient requesting referral. Dr. Nicki Reaper is primary care at [facility]. Okay to place referral?

## 2015-05-12 NOTE — Telephone Encounter (Signed)
°  Patient is asking for a referral to Dr.Charlene Nicki Reaper [phone]. Patient says she talked with Dr.Miller about this referral at her last visit.

## 2015-05-13 NOTE — Telephone Encounter (Signed)
Dr. Einar Pheasant is not accepting new patients at this time. There are two providers at [facility] who are accepting new patients, Dr. Josephina Gip and Dr. Lacinda Axon. Both female providers.   Message left to return call to [place] at [phone] to discuss.

## 2015-05-14 NOTE — Telephone Encounter (Signed)
Glendon Axe with IM at [facility] is another suggestion.

## 2015-05-14 NOTE — Telephone Encounter (Signed)
Patient returned call and is advised that Dr. Aldona Lento. Derrel Nip are not accepting new patients or MD referrals.   Patient would like to know if Dr. Sabra Heck has any recommendations for providers in [place] area.

## 2015-05-19 NOTE — Telephone Encounter (Signed)
Dr. Candiss Norse at [facility] is accepting new patients. Confirmed and returned call to patient.   [facility] [address], [place]  [address] [phone]  Called patient. She is not home. Message left with husband to return my call.

## 2015-05-20 NOTE — Telephone Encounter (Signed)
Patient returning call.

## 2015-05-20 NOTE — Telephone Encounter (Signed)
Called patient. Detailed message left with provider name and phone number, Detailed message okay per designated party release form.   Routing to provider for final review. Patient agreeable to disposition. Will close encounter.

## 2015-06-22 ENCOUNTER — Other Ambulatory Visit: Payer: Self-pay | Admitting: Family Medicine

## 2015-06-22 ENCOUNTER — Ambulatory Visit
Admission: RE | Admit: 2015-06-22 | Discharge: 2015-06-22 | Disposition: A | Discharge status: Home / Self Care | Payer: Medicare Other | Source: Ambulatory Visit | Attending: Family Medicine | Admitting: Family Medicine

## 2015-06-22 DIAGNOSIS — I82402 Acute embolism and thrombosis of unspecified deep veins of left lower extremity: Secondary | ICD-10-CM

## 2015-06-22 DIAGNOSIS — M7989 Other specified soft tissue disorders: Secondary | ICD-10-CM | POA: Diagnosis not present

## 2015-07-23 DIAGNOSIS — Z23 Encounter for immunization: Secondary | ICD-10-CM | POA: Diagnosis not present

## 2015-07-28 DIAGNOSIS — E1165 Type 2 diabetes mellitus with hyperglycemia: Secondary | ICD-10-CM | POA: Diagnosis not present

## 2015-07-28 DIAGNOSIS — I10 Essential (primary) hypertension: Secondary | ICD-10-CM | POA: Diagnosis not present

## 2015-07-28 DIAGNOSIS — Z13820 Encounter for screening for osteoporosis: Secondary | ICD-10-CM | POA: Diagnosis not present

## 2015-07-28 DIAGNOSIS — R8290 Unspecified abnormal findings in urine: Secondary | ICD-10-CM | POA: Diagnosis not present

## 2015-07-28 DIAGNOSIS — R05 Cough: Secondary | ICD-10-CM | POA: Diagnosis not present

## 2015-07-28 DIAGNOSIS — E782 Mixed hyperlipidemia: Secondary | ICD-10-CM | POA: Diagnosis not present

## 2015-07-28 DIAGNOSIS — I82402 Acute embolism and thrombosis of unspecified deep veins of left lower extremity: Secondary | ICD-10-CM | POA: Diagnosis not present

## 2015-07-28 DIAGNOSIS — E119 Type 2 diabetes mellitus without complications: Secondary | ICD-10-CM | POA: Diagnosis not present

## 2015-07-28 LAB — LIPID PANEL
Cholesterol: 188 mg/dL (ref 0–200)
LDL Cholesterol: 109 mg/dL

## 2015-07-28 LAB — HEMOGLOBIN A1C: Hemoglobin A1C: 6.5

## 2015-07-28 LAB — BASIC METABOLIC PANEL: Creatinine: 0.9 mg/dL (ref 0.5–1.1)

## 2015-07-31 DIAGNOSIS — I82402 Acute embolism and thrombosis of unspecified deep veins of left lower extremity: Secondary | ICD-10-CM | POA: Diagnosis not present

## 2015-07-31 DIAGNOSIS — I10 Essential (primary) hypertension: Secondary | ICD-10-CM | POA: Diagnosis not present

## 2015-07-31 DIAGNOSIS — E119 Type 2 diabetes mellitus without complications: Secondary | ICD-10-CM | POA: Diagnosis not present

## 2015-07-31 DIAGNOSIS — E782 Mixed hyperlipidemia: Secondary | ICD-10-CM | POA: Diagnosis not present

## 2015-07-31 DIAGNOSIS — Z13820 Encounter for screening for osteoporosis: Secondary | ICD-10-CM | POA: Diagnosis not present

## 2015-10-08 DIAGNOSIS — E2839 Other primary ovarian failure: Secondary | ICD-10-CM | POA: Diagnosis not present

## 2015-11-10 ENCOUNTER — Ambulatory Visit (INDEPENDENT_AMBULATORY_CARE_PROVIDER_SITE_OTHER): Payer: Medicare Other | Admitting: Internal Medicine

## 2015-11-10 ENCOUNTER — Encounter: Payer: Self-pay | Admitting: Internal Medicine

## 2015-11-10 VITALS — BP 128/70 | HR 61 | Temp 97.7°F | Ht 65.5 in | Wt 209.0 lb

## 2015-11-10 DIAGNOSIS — Z86718 Personal history of other venous thrombosis and embolism: Secondary | ICD-10-CM

## 2015-11-10 DIAGNOSIS — K219 Gastro-esophageal reflux disease without esophagitis: Secondary | ICD-10-CM | POA: Diagnosis not present

## 2015-11-10 DIAGNOSIS — M159 Polyosteoarthritis, unspecified: Secondary | ICD-10-CM | POA: Insufficient documentation

## 2015-11-10 DIAGNOSIS — I82409 Acute embolism and thrombosis of unspecified deep veins of unspecified lower extremity: Secondary | ICD-10-CM | POA: Insufficient documentation

## 2015-11-10 DIAGNOSIS — I10 Essential (primary) hypertension: Secondary | ICD-10-CM | POA: Diagnosis not present

## 2015-11-10 DIAGNOSIS — E0590 Thyrotoxicosis, unspecified without thyrotoxic crisis or storm: Secondary | ICD-10-CM | POA: Insufficient documentation

## 2015-11-10 DIAGNOSIS — E1142 Type 2 diabetes mellitus with diabetic polyneuropathy: Secondary | ICD-10-CM | POA: Diagnosis not present

## 2015-11-10 MED ORDER — GLIMEPIRIDE 1 MG PO TABS: 1.0000 mg | ORAL_TABLET | Freq: Every day | ORAL | Status: DC

## 2015-11-10 MED ORDER — VALSARTAN-HYDROCHLOROTHIAZIDE 160-12.5 MG PO TABS: 1.0000 | ORAL_TABLET | Freq: Every day | ORAL | Status: DC

## 2015-11-10 MED ORDER — PIOGLITAZONE HCL 45 MG PO TABS: 45.0000 mg | ORAL_TABLET | Freq: Every day | ORAL | Status: DC

## 2015-11-10 MED ORDER — METOPROLOL SUCCINATE ER 25 MG PO TB24: 25.0000 mg | ORAL_TABLET | Freq: Every day | ORAL | Status: DC

## 2015-11-10 NOTE — Assessment & Plan Note (Signed)
Takes estrogen only once in a while
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
## 2015-11-10 NOTE — Progress Notes (Signed)
Pre visit review using our clinic review tool, if applicable. No additional management support is needed unless otherwise documented below in the visit note. 

## 2015-11-10 NOTE — Assessment & Plan Note (Signed)
She isn't sure why type---Hashimoto's  Recent TSH normal

## 2015-11-10 NOTE — Assessment & Plan Note (Signed)
BP Readings from Last 3 Encounters:  11/10/15 128/70  03/19/15 152/70  03/09/15 148/72   Good control No changes needed

## 2015-11-10 NOTE — Assessment & Plan Note (Signed)
No problems since off estrogen

## 2015-11-10 NOTE — Assessment & Plan Note (Signed)
Doing okay without regular meds now

## 2015-11-10 NOTE — Assessment & Plan Note (Signed)
A1c 6.5%

## 2015-11-10 NOTE — Addendum Note (Signed)
Addended by: Despina Hidden on: 11/10/2015 12:36 PM   Modules accepted: Orders

## 2015-11-11 ENCOUNTER — Encounter (HOSPITAL_COMMUNITY): Payer: Self-pay | Admitting: Emergency Medicine

## 2015-11-11 ENCOUNTER — Emergency Department (HOSPITAL_COMMUNITY): Payer: Medicare Other

## 2015-11-11 ENCOUNTER — Emergency Department (HOSPITAL_COMMUNITY)
Admission: EM | Admit: 2015-11-11 | Discharge: 2015-11-11 | Discharge status: Against Medical Advice | Payer: Medicare Other | Attending: Physician Assistant | Admitting: Physician Assistant

## 2015-11-11 DIAGNOSIS — M159 Polyosteoarthritis, unspecified: Secondary | ICD-10-CM | POA: Insufficient documentation

## 2015-11-11 DIAGNOSIS — Z79899 Other long term (current) drug therapy: Secondary | ICD-10-CM | POA: Diagnosis not present

## 2015-11-11 DIAGNOSIS — Z87891 Personal history of nicotine dependence: Secondary | ICD-10-CM | POA: Diagnosis not present

## 2015-11-11 DIAGNOSIS — R2242 Localized swelling, mass and lump, left lower limb: Secondary | ICD-10-CM | POA: Insufficient documentation

## 2015-11-11 DIAGNOSIS — M7989 Other specified soft tissue disorders: Secondary | ICD-10-CM | POA: Diagnosis not present

## 2015-11-11 DIAGNOSIS — Z872 Personal history of diseases of the skin and subcutaneous tissue: Secondary | ICD-10-CM | POA: Diagnosis not present

## 2015-11-11 DIAGNOSIS — I10 Essential (primary) hypertension: Secondary | ICD-10-CM | POA: Diagnosis not present

## 2015-11-11 DIAGNOSIS — Z86018 Personal history of other benign neoplasm: Secondary | ICD-10-CM | POA: Insufficient documentation

## 2015-11-11 DIAGNOSIS — M25472 Effusion, left ankle: Secondary | ICD-10-CM

## 2015-11-11 DIAGNOSIS — M25572 Pain in left ankle and joints of left foot: Secondary | ICD-10-CM | POA: Diagnosis present

## 2015-11-11 DIAGNOSIS — Z7984 Long term (current) use of oral hypoglycemic drugs: Secondary | ICD-10-CM | POA: Diagnosis not present

## 2015-11-11 DIAGNOSIS — E119 Type 2 diabetes mellitus without complications: Secondary | ICD-10-CM | POA: Diagnosis not present

## 2015-11-11 DIAGNOSIS — Z880 Allergy status to penicillin: Secondary | ICD-10-CM | POA: Insufficient documentation

## 2015-11-11 DIAGNOSIS — Z8669 Personal history of other diseases of the nervous system and sense organs: Secondary | ICD-10-CM | POA: Insufficient documentation

## 2015-11-11 DIAGNOSIS — Z86718 Personal history of other venous thrombosis and embolism: Secondary | ICD-10-CM | POA: Insufficient documentation

## 2015-11-11 DIAGNOSIS — Z8719 Personal history of other diseases of the digestive system: Secondary | ICD-10-CM | POA: Diagnosis not present

## 2015-11-11 NOTE — ED Provider Notes (Addendum)
CSN: [id number]     Arrival date & time 11/11/15  1723 History   First MD Initiated Contact with Patient 11/11/15 2033     Chief Complaint  Patient presents with  . Ankle Pain     (Consider location/radiation/quality/duration/timing/severity/associated sxs/prior Treatment) HPI   Patient is a 75 year old female with history of DVTs the left lower extremity. Patient on file to for 3 months. At that time it was thought to be provoked by an airplane ride.  Patient percent today with a subtle swelling to left lower extremity. There is a change in contour around her medial ankle. Patient very upset about her weight in the waiting room before seen by provider.  She was initially refusing to change into her gown, and refusing to give her urine. She is upset about the wait in the waiting room.     Past Medical History  Diagnosis Date  . Uterine fibroid   . Type 2 diabetes, controlled, with peripheral neuropathy ([place])   . Insomnia   . Rosacea   . Retinal tear of left eye   . Hypertension   . History of DVT (deep vein thrombosis) 2016    estrogen and airflights. Rx with xarelto for 3 months  . Generalized osteoarthritis of multiple sites   . GERD (gastroesophageal reflux disease)   . Hyperthyroidism     treated briefly in college   Past Surgical History  Procedure Laterality Date  . Cataract extraction    . Retinal tear repair cryotherapy      10/09/13, then again 3/15  . Carpal tunnel release Right     Dr Burney Gauze   Family History  Problem Relation Age of Onset  . Diabetes Father   . Diabetes Other     grandmother  . Pancreatic cancer Father    Social History  Substance Use Topics  . Smoking status: Former Smoker    Quit date: 09/26/1993  . Smokeless tobacco: Never Used     Comment: quit in 1996  . Alcohol Use: 0.6 oz/week    1 Standard drinks or equivalent per week     Comment: occasional wine   OB History    Gravida Para Term Preterm AB TAB SAB Ectopic Multiple  Living   5 3        3      Review of Systems  Constitutional: Negative for activity change.  Respiratory: Negative for shortness of breath.   Cardiovascular: Negative for chest pain.      Allergies  Penicillins; Sulfa antibiotics; and Azithromycin  Home Medications   Prior to Admission medications   Medication Sig Start Date End Date Taking? Authorizing Provider  Ascorbic Acid (VITAMIN C) 100 MG CHEW Chew 200 mg by mouth daily as needed (immune system support).   Yes Historical Provider, MD  celecoxib (CELEBREX) 200 MG capsule Take 200 mg by mouth as needed for mild pain or moderate pain.  11/12/13  Yes Historical Provider, MD  glimepiride (AMARYL) 1 MG tablet Take 1 tablet (1 mg total) by mouth daily with breakfast. 11/10/15  Yes [name], MD  Melatonin-Pyridoxine (MELATIN PO) Take 1 tablet by mouth at bedtime.    Yes Historical Provider, MD  metoprolol succinate (TOPROL-XL) 25 MG 24 hr tablet Take 1 tablet (25 mg total) by mouth daily. 11/10/15  Yes [name], MD  pioglitazone (ACTOS) 45 MG tablet Take 1 tablet (45 mg total) by mouth daily. 11/10/15  Yes [name], MD  valsartan-hydrochlorothiazide (DIOVAN-HCT) 160-12.5 MG  tablet Take 1 tablet by mouth daily. 11/10/15  Yes [name], MD  fluticasone (VERAMYST) 27.5 MCG/SPRAY nasal spray Place 2 sprays into the nose daily as needed for allergies.     Historical Provider, MD   BP 196/88 mmHg  Pulse 101  Temp(Src) 98.5 F (36.9 C) (Oral)  Resp 20  SpO2 99%  LMP 09/26/1997 Physical Exam  Constitutional: She is oriented to person, place, and time. She appears well-developed and well-nourished.  HENT:  Head: Normocephalic and atraumatic.  Eyes: Right eye exhibits no discharge.  Cardiovascular: Normal rate, regular rhythm and normal heart sounds.   No murmur heard. Pulmonary/Chest: Effort normal and breath sounds normal. She has no wheezes. She has no rales.  Abdominal: Soft. She exhibits no distension.  There is no tenderness.  Musculoskeletal:  Trace edema to left medial ankle.  Neurological: She is oriented to person, place, and time.  Skin: Skin is warm and dry. She is not diaphoretic.  Psychiatric: She has a normal mood and affect.  Nursing note and vitals reviewed.   ED Course  Procedures (including critical care time) Labs Review Labs Reviewed - No data to display  Imaging Review Dg Ankle Complete Left  11/11/2015  CLINICAL DATA:  Sudden onset of pain and swelling left ankle without injury - pt states hx of previous blood clot in left leg - pt states symptoms/feeling is the same EXAM: LEFT ANKLE COMPLETE - 3+ VIEW COMPARISON:  03/19/2015 FINDINGS: No acute fracture.  No dislocation. There is narrowing of the medial aspect of the tibiotalar joint with mild subchondral sclerosis along the medial tailor dome. Marginal spurring is noted from the anterior articular margin of the distal tibia. There is diffuse soft tissue swelling. IMPRESSION: 1. No fracture or dislocation. 2. Arthropathic/degenerative changes of the tibiotalar joint. Electronically Signed   By: Lajean Manes M.D.   On: 11/11/2015 18:37   I have personally reviewed and evaluated these images and lab results as part of my medical decision-making.   EKG Interpretation None      MDM   Final diagnoses:  None    Patient is a 75 her old female presenting with trace edema and swelling to the medial left ankle. It is very subtle on exam. Patient is here for ultrasound. Patient's very upset because she cannot get ultrasound done tonight. We told her the protocol of taking Lovenox and following up in the morning at [facility].   Patient became increasingly agitated. She does not want to take any medication without getting ultrasound done first. We explained that they cannot be done.  She is refusing Lovenox because one time she heard someone dying from it. She is also refusing xarlto because she has also heard the same thing about  xarlto. We discussed how she's been on Xarlto in the past, but this has not affected her decision-making.  We have tried to offer many different things to make patient's visit better, but have been unable to help assuage the situation.   I discussed with patient whether she wanted me to call family member to discuss benefits and risks of not taking this medication. I am concnerd with new leg swelling and previous DVT, that she may have another DVT. She reports that she can make her own decisions and does not want to give me the family members information at this time.  She states she understandsd the risk of disability or death if she does not take the medication.  I explained I needed  to get paperwork to have her folllow up at [facility].   I returned with paper work and went to her room, she had walked out.  Tried to get her in the parking lot and she was driving away.     Kayla Deshaies Julio Alm, MD 11/11/15 2122  Page Pucciarelli Julio Alm, MD 11/11/15 2128  9:33 PM Called patient's home phone to make sure a family member was aware of the choices.  Husband was very pleasant and seems aware of the situation. I reiterated that she is welcome to come back to [facility] in am for [place], or here for medication bewteen now and then.    Timmie Dugue Julio Alm, MD 11/11/15 2134

## 2015-11-11 NOTE — ED Notes (Signed)
pt walked out without discharge instructions and directions to be seen at [facility] for further evaluation

## 2015-11-11 NOTE — ED Notes (Signed)
Pt is upset because she cannot get an ultrasound tonight and refused medication for a possible DVT.  She is going to sign out AMA.

## 2015-11-11 NOTE — ED Notes (Signed)
Patient presents for left ankle pain starting midday today, history of DVT, swelling noted to medial left ankle, pedal pulses intact, denies numbness and tingling to same, warm to touch.

## 2015-11-12 ENCOUNTER — Encounter: Payer: Self-pay | Admitting: Internal Medicine

## 2015-11-12 ENCOUNTER — Encounter: Payer: Self-pay | Admitting: Obstetrics & Gynecology

## 2015-11-12 DIAGNOSIS — Z86718 Personal history of other venous thrombosis and embolism: Secondary | ICD-10-CM | POA: Diagnosis not present

## 2015-11-12 DIAGNOSIS — M7989 Other specified soft tissue disorders: Secondary | ICD-10-CM | POA: Diagnosis not present

## 2015-11-18 ENCOUNTER — Encounter: Payer: Self-pay | Admitting: Internal Medicine

## 2015-11-20 DIAGNOSIS — Z1231 Encounter for screening mammogram for malignant neoplasm of breast: Secondary | ICD-10-CM | POA: Diagnosis not present

## 2015-12-01 ENCOUNTER — Encounter: Payer: Self-pay | Admitting: Internal Medicine

## 2015-12-07 DIAGNOSIS — Z961 Presence of intraocular lens: Secondary | ICD-10-CM | POA: Diagnosis not present

## 2015-12-07 DIAGNOSIS — E119 Type 2 diabetes mellitus without complications: Secondary | ICD-10-CM | POA: Diagnosis not present

## 2015-12-07 DIAGNOSIS — H43813 Vitreous degeneration, bilateral: Secondary | ICD-10-CM | POA: Diagnosis not present

## 2015-12-07 LAB — HM DIABETES EYE EXAM

## 2015-12-15 ENCOUNTER — Encounter: Payer: Self-pay | Admitting: Internal Medicine

## 2016-01-21 DIAGNOSIS — E119 Type 2 diabetes mellitus without complications: Secondary | ICD-10-CM | POA: Diagnosis not present

## 2016-01-21 DIAGNOSIS — Z7984 Long term (current) use of oral hypoglycemic drugs: Secondary | ICD-10-CM | POA: Diagnosis not present

## 2016-01-21 DIAGNOSIS — I10 Essential (primary) hypertension: Secondary | ICD-10-CM | POA: Diagnosis not present

## 2016-01-28 DIAGNOSIS — E119 Type 2 diabetes mellitus without complications: Secondary | ICD-10-CM | POA: Diagnosis not present

## 2016-02-24 ENCOUNTER — Encounter: Payer: Self-pay | Admitting: Internal Medicine

## 2016-03-11 ENCOUNTER — Ambulatory Visit (INDEPENDENT_AMBULATORY_CARE_PROVIDER_SITE_OTHER): Payer: Medicare Other | Admitting: Internal Medicine

## 2016-03-11 ENCOUNTER — Encounter: Payer: Self-pay | Admitting: Internal Medicine

## 2016-03-11 ENCOUNTER — Telehealth: Payer: Self-pay

## 2016-03-11 VITALS — BP 112/60 | HR 84 | Temp 98.2°F | Wt 201.0 lb

## 2016-03-11 DIAGNOSIS — M7662 Achilles tendinitis, left leg: Secondary | ICD-10-CM | POA: Diagnosis not present

## 2016-03-11 DIAGNOSIS — M7660 Achilles tendinitis, unspecified leg: Secondary | ICD-10-CM | POA: Insufficient documentation

## 2016-03-11 NOTE — Telephone Encounter (Signed)
PLEASE NOTE: All timestamps contained within this report are represented as [place] Standard Time. CONFIDENTIALTY NOTICE: This fax transmission is intended only for the addressee. It contains information that is legally privileged, confidential or otherwise protected from use or disclosure. If you are not the intended recipient, you are strictly prohibited from reviewing, disclosing, copying using or disseminating any of this information or taking any action in reliance on or regarding this information. If you have received this fax in error, please notify us immediately by telephone so that we can arrange for its return to [place]. Phone: [phone], Toll-Free: [phone], Fax: [phone] Page: 1 of 2 Call Id: [id number] [place] Patient Name: Angela Bentley Gender: Female DOB: 19-Sep-1941 Age: 75 Y 70 M 1 D Return Phone Number: [phone] (Primary), [phone] (Secondary) Address: City/State/Zip: [place] Client [place] Night - Client Client Site [place] Physician Viviana Simpler - MD Contact Type Call Who Is Calling Patient / Member / Family / Caregiver Call Type Triage / Clinical Relationship To Patient Self Return Phone Number [phone] (Secondary) Chief Complaint Leg Pain Reason for Call Symptomatic / Request for [place] states that she wants to have an ultrasound on her leg due to traveling last week and being on a plane and the possibility of having a blood clot. She wants to know if she needs to get orders for the ultrasound. PreDisposition Go to ED Translation No Nurse Assessment Nurse: Lavera Guise, RN, Vaughan Basta Date/Time (Eastern Time): 03/11/2016 7:04:59 AM Confirm and document reason for call. If symptomatic, describe symptoms. You must click the next button to save text entered. ---Caller  states hx of blood clot. Really long flight last weekend. Pain started in middle of night. 2/10. In the heels, feels stuffy, extends up calf. Feels like the beginning of muscle cramp. Things will go better with a Dr.'s order. [place] the patient traveled out of the country within the last 30 days? ---No Does the patient have any new or worsening symptoms? ---Yes Will a triage be completed? ---Yes Related visit to physician within the last 2 weeks? ---N/A Does the PT have any chronic conditions? (i.e. diabetes, asthma, etc.) ---No Is this a behavioral health or substance abuse call? ---No Guidelines Guideline Title Affirmed Question Affirmed Notes Nurse Date/Time Eilene Ghazi Time) Leg Pain History of prior "blood clot" in leg or lungs (i.e., deep vein thrombosis, pulmonary embolism) Lavera Guise, RN, Vaughan Basta 03/11/2016 7:11:21 AM Disp. Time Eilene Ghazi Time) Disposition Final User 03/11/2016 7:14:30 AM Paged On Call back to Call [place], Vaughan Basta PLEASE NOTE: All timestamps contained within this report are represented as [place] Standard Time. CONFIDENTIALTY NOTICE: This fax transmission is intended only for the addressee. It contains information that is legally privileged, confidential or otherwise protected from use or disclosure. If you are not the intended recipient, you are strictly prohibited from reviewing, disclosing, copying using or disseminating any of this information or taking any action in reliance on or regarding this information. If you have received this fax in error, please notify us immediately by telephone so that we can arrange for its return to [place]. Phone: [phone], Toll-Free: [phone], Fax: [phone] Page: 2 of 2 Call Id: [id number] [place]. Time Eilene Ghazi Time) Disposition Final User 03/11/2016 7:28:47 AM Call Completed Lavera Guise, RN, Vaughan Basta 03/11/2016 7:13:48 AM See Physician within 4 Hours (or PCP triage) Yes Lavera Guise, RN, Phineas Semen Understands:  Yes Disagree/Comply: Comply Care Advice Given Per Guideline SEE PHYSICIAN WITHIN 4 HOURS (or PCP triage): CALL BACK IF: * You become worse. CARE ADVICE given per Leg Pain (Adult) guideline. Referrals [facility] - ED Paging DoctorName Phone DateTime Result/Outcome Message Type Notes Kathlene November - MD [id number] 03/11/2016 7:14:30 AM Paged On Call Back to Call Center Doctor Paged Please call Jacques Navy [phone] concerning this patient. Kathlene November - MD 03/11/2016 7:27:05 AM Spoke with On Call - General Message Result Dr. states he will not order an ultrasound. She needs to go into the office or to the ER with any severe symptoms.

## 2016-03-11 NOTE — Progress Notes (Signed)
Subjective:    Patient ID: Angela Bentley, female    DOB: [birth year], 75 y.o.   MRN: [id number]  HPI Here due to concern for recurrent DVT  Flew to [facility] and then 3 hour car trip--- went 6/10 and returned 6/13 Home 2 days Wore the hose, take asa 162 daily for a week  No problems when first arriving or yesterday Awoke last night --middle of the night--- with pain in left heel (2-3/10) Worse with weight bearing Veins more prominent there No calf or thigh swelling or pain--but has sense of being ready to have left calf cramp  Still did water aerobics this morning--no problems  Current Outpatient Prescriptions on File Prior to Visit  Medication Sig Dispense Refill  . Ascorbic Acid (VITAMIN C) 100 MG CHEW Chew 200 mg by mouth daily as needed (immune system support).    . celecoxib (CELEBREX) 200 MG capsule Take 200 mg by mouth as needed for mild pain or moderate pain.     . fluticasone (VERAMYST) 27.5 MCG/SPRAY nasal spray Place 2 sprays into the nose daily as needed for allergies.     Marland Kitchen glimepiride (AMARYL) 1 MG tablet Take 1 tablet (1 mg total) by mouth daily with breakfast. 90 tablet 3  . metoprolol succinate (TOPROL-XL) 25 MG 24 hr tablet Take 1 tablet (25 mg total) by mouth daily. 90 tablet 3  . pioglitazone (ACTOS) 45 MG tablet Take 1 tablet (45 mg total) by mouth daily. 90 tablet 3  . valsartan-hydrochlorothiazide (DIOVAN-HCT) 160-12.5 MG tablet Take 1 tablet by mouth daily. 90 tablet 3  . Melatonin-Pyridoxine (MELATIN PO) Take 1 tablet by mouth at bedtime. Reported on 03/11/2016     No current facility-administered medications on file prior to visit.    Allergies  Allergen Reactions  . Penicillins     Has patient had a PCN reaction causing immediate rash, facial/tongue/throat swelling, SOB or lightheadedness with hypotension: Yes Has patient had a PCN reaction causing severe rash involving mucus membranes or skin necrosis: Yes Has patient had a PCN reaction that required  hospitalization No Has patient had a PCN reaction occurring within the last 10 years: No If all of the above answers are "NO", then may proceed with Cephalosporin use.    . Sulfa Antibiotics     Rash/itching  . Azithromycin Palpitations    Past Medical History  Diagnosis Date  . Uterine fibroid   . Type 2 diabetes, controlled, with peripheral neuropathy ([place])   . Insomnia   . Rosacea   . Retinal tear of left eye   . Hypertension   . History of DVT (deep vein thrombosis) 2016    estrogen and airflights. Rx with xarelto for 3 months  . Generalized osteoarthritis of multiple sites   . GERD (gastroesophageal reflux disease)   . Hyperthyroidism     treated briefly in college    Past Surgical History  Procedure Laterality Date  . Cataract extraction    . Retinal tear repair cryotherapy      10/09/13, then again 3/15  . Carpal tunnel release Right     Dr Burney Gauze    Family History  Problem Relation Age of Onset  . Diabetes Father   . Diabetes Other     grandmother  . Pancreatic cancer Father     Social History   Social History  . Marital Status: Married    Spouse Name: N/A  . Number of Children: 3  . Years of Education: N/A   Occupational  History  . 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 Smoking status: Former Smoker    Quit date: 09/26/1993  . Smokeless tobacco: Never Used     Comment: quit in 1996  . Alcohol Use: 0.6 oz/week    1 Standard drinks or equivalent per week     Comment: occasional wine  . Drug Use: No  . Sexual Activity: No     Comment: husband vasectomy   Other Topics Concern  . Not on file   Social History Narrative   Has living will   Husband is health care POA-- or daughter Izora Gala   Would accept resuscitation attempts   Would not want tube feeds if cognitively unaware   Review of Systems  No chest pain  No SOB     Objective:   Physical Exam  Musculoskeletal:  No calf swelling or  tenderness No ankle swelling and normal ROM Point tenderness in left Achilles just above insertion          Assessment & Plan:

## 2016-03-11 NOTE — Telephone Encounter (Signed)
Will decide at [place]

## 2016-03-11 NOTE — Progress Notes (Signed)
Pre visit review using our clinic review tool, if applicable. No additional management support is needed unless otherwise documented below in the visit note. 

## 2016-03-11 NOTE — Telephone Encounter (Signed)
Pt has appt with Dr [name] 03/11/16 at 12:15.

## 2016-03-11 NOTE — Assessment & Plan Note (Signed)
Reassured that there is nothing to suggest DVT Discussed proper footwear Can use the celebrex prn and ice prn

## 2016-04-21 ENCOUNTER — Ambulatory Visit (INDEPENDENT_AMBULATORY_CARE_PROVIDER_SITE_OTHER): Payer: Medicare Other | Admitting: Obstetrics & Gynecology

## 2016-04-21 ENCOUNTER — Encounter: Payer: Self-pay | Admitting: Obstetrics & Gynecology

## 2016-04-21 VITALS — BP 138/66 | HR 84 | Resp 16 | Ht 65.75 in | Wt 200.0 lb

## 2016-04-21 DIAGNOSIS — Z124 Encounter for screening for malignant neoplasm of cervix: Secondary | ICD-10-CM | POA: Diagnosis not present

## 2016-04-21 DIAGNOSIS — Z79899 Other long term (current) drug therapy: Secondary | ICD-10-CM

## 2016-04-21 DIAGNOSIS — Z01419 Encounter for gynecological examination (general) (routine) without abnormal findings: Secondary | ICD-10-CM

## 2016-04-21 NOTE — Progress Notes (Signed)
75 y.o. [place].Hedger MarriedCaucasianF here for annual exam.  Doing well.  Denies vaginal bleeding.  Seeing new PCP at [facility]--Dr. [name].  Has appt in September.  She has stopped her estrogen which I am in complete agreement with.  States she would like for me to "give it back" but knows it's not good for her given medical issues this past year.  Having hot flashes.  Feels "older" off the estrogen.  Has some concerns about risks of bladder cancer while being on Actos.  Last HbA1c was 4/17.  7.0.  States she still feels like she has "old lady urine" with odor.  Micro and culture negative last year.  Patient's last menstrual period was 09/26/1997.          Sexually active: No.  The current method of family planning is post menopausal status.    Exercising: Yes.    water aerobics, weights, walking Smoker:  no  Health Maintenance: Pap:  12/24/13 Neg History of abnormal Pap:  no MMG:  11/20/15 BIRADS1:neg Colonoscopy:  01/20/14 Polyps  BMD:   10/08/15  TDaP:  10/23/12  Pneumonia vaccine(s):  2015 Zostavax:   09/2006 Hep C testing: not indicated Screening Labs: PCP, Urine today: PCP   reports that she quit smoking about 22 years ago. She has never used smokeless tobacco. She reports that she drinks about 0.6 oz of alcohol per week . She reports that she does not use drugs.  Past Medical History:  Diagnosis Date  . Generalized osteoarthritis of multiple sites   . GERD (gastroesophageal reflux disease)   . History of DVT (deep vein thrombosis) 2016   estrogen and airflights. Rx with xarelto for 3 months  . Hypertension   . Hyperthyroidism    treated briefly in college  . Insomnia   . Retinal tear of left eye   . Rosacea   . Type 2 diabetes, controlled, with peripheral neuropathy ([place])   . Uterine fibroid     Past Surgical History:  Procedure Laterality Date  . CARPAL TUNNEL RELEASE Right    Dr Burney Gauze  . CATARACT EXTRACTION    . RETINAL TEAR REPAIR CRYOTHERAPY     10/09/13, then again  3/15    Current Outpatient Prescriptions  Medication Sig Dispense Refill  . Ascorbic Acid (VITAMIN C) 100 MG CHEW Chew 200 mg by mouth daily as needed (immune system support).    . celecoxib (CELEBREX) 200 MG capsule Take 200 mg by mouth as needed for mild pain or moderate pain.     . fluticasone (VERAMYST) 27.5 MCG/SPRAY nasal spray Place 2 sprays into the nose daily as needed for allergies.     Marland Kitchen glimepiride (AMARYL) 1 MG tablet Take 1 tablet (1 mg total) by mouth daily with breakfast. 90 tablet 3  . metoprolol succinate (TOPROL-XL) 25 MG 24 hr tablet Take 1 tablet (25 mg total) by mouth daily. 90 tablet 3  . Multiple Vitamin (MULTIVITAMIN) tablet Take 1 tablet by mouth daily.    . pioglitazone (ACTOS) 45 MG tablet Take 1 tablet (45 mg total) by mouth daily. 90 tablet 3  . valsartan-hydrochlorothiazide (DIOVAN-HCT) 160-12.5 MG tablet Take 1 tablet by mouth daily. 90 tablet 3   No current facility-administered medications for this visit.     Family History  Problem Relation Age of Onset  . Diabetes Father   . Pancreatic cancer Father   . Diabetes Other     grandmother    ROS:  Pertinent items are noted in HPI.  Otherwise, a comprehensive ROS was negative.  Exam:   BP 138/66 (BP Location: Right Arm, Patient Position: Sitting, Cuff Size: Normal)   Pulse 84   Resp 16   Ht 5' 5.75" (1.67 m)   Wt 200 lb (90.7 kg)   LMP 09/26/1997   BMI 32.53 kg/m  Height: 5' 5.75" (167 cm)  Ht Readings from Last 3 Encounters:  04/21/16 5' 5.75" (1.67 m)  11/10/15 5' 5.5" (1.664 m)  03/09/15 5' 5.75" (1.67 m)    General appearance: alert, cooperative and appears stated age Head: Normocephalic, without obvious abnormality, atraumatic Neck: no adenopathy, supple, symmetrical, trachea midline and thyroid normal to inspection and palpation Lungs: clear to auscultation bilaterally Breasts: normal appearance, no masses or tenderness Heart: regular rate and rhythm Abdomen: soft, non-tender; bowel  sounds normal; no masses,  no organomegaly Extremities: extremities normal, atraumatic, no cyanosis or edema Skin: Skin color, texture, turgor normal. No rashes or lesions Lymph nodes: Cervical, supraclavicular, and axillary nodes normal. No abnormal inguinal nodes palpated Neurologic: Grossly normal   Pelvic: External genitalia:  no lesions              Urethra:  normal appearing urethra with no masses, tenderness or lesions              Bartholins and Skenes: normal                 Vagina: normal appearing vagina with normal color and discharge, no lesions              Cervix: no lesions              Pap taken: Yes.   Bimanual Exam:  Uterus:  normal size, contour, position, consistency, mobility, non-tender              Adnexa: normal adnexa and no mass, fullness, tenderness               Rectovaginal: Confirms               Anus:  normal sphincter tone, no lesions  Chaperone was present for exam.  A:  Well Woman with normal exam PMP, stopped HRT this year, now with hot flashes H/O uterine fibroids Diabetes Insomnia Increased urinary odor with neg micro and culture 1/16  P: Mammogram yearly.  Pt and I discussed this today. Labs with PCP pap smear obtained 2015.   Pap obtained.  Pt does not want to stop having pap smears.  Guidelines reviewed. Urine micro Return annually or prn

## 2016-04-22 LAB — URINALYSIS, MICROSCOPIC ONLY
Bacteria, UA: NONE SEEN [HPF]
Casts: NONE SEEN [LPF]
Crystals: NONE SEEN [HPF]
Squamous Epithelial / LPF: NONE SEEN [HPF] (ref <=5)
WBC, UA: NONE SEEN WBC/HPF (ref <=5)
Yeast: NONE SEEN [HPF]

## 2016-04-22 LAB — IPS PAP SMEAR ONLY

## 2016-04-27 DIAGNOSIS — L570 Actinic keratosis: Secondary | ICD-10-CM | POA: Diagnosis not present

## 2016-04-27 DIAGNOSIS — L821 Other seborrheic keratosis: Secondary | ICD-10-CM | POA: Diagnosis not present

## 2016-04-27 DIAGNOSIS — X32XXXD Exposure to sunlight, subsequent encounter: Secondary | ICD-10-CM | POA: Diagnosis not present

## 2016-04-27 DIAGNOSIS — D225 Melanocytic nevi of trunk: Secondary | ICD-10-CM | POA: Diagnosis not present

## 2016-05-13 ENCOUNTER — Ambulatory Visit: Payer: Medicare Other | Admitting: Family Medicine

## 2016-05-27 ENCOUNTER — Ambulatory Visit: Payer: Medicare Other | Admitting: Internal Medicine

## 2016-06-02 ENCOUNTER — Ambulatory Visit (INDEPENDENT_AMBULATORY_CARE_PROVIDER_SITE_OTHER): Payer: Medicare Other | Admitting: Internal Medicine

## 2016-06-02 ENCOUNTER — Encounter: Payer: Self-pay | Admitting: Internal Medicine

## 2016-06-02 VITALS — BP 134/70 | HR 80 | Temp 97.9°F | Ht 65.75 in | Wt 203.0 lb

## 2016-06-02 DIAGNOSIS — Z0000 Encounter for general adult medical examination without abnormal findings: Secondary | ICD-10-CM | POA: Diagnosis not present

## 2016-06-02 DIAGNOSIS — E1142 Type 2 diabetes mellitus with diabetic polyneuropathy: Secondary | ICD-10-CM | POA: Diagnosis not present

## 2016-06-02 DIAGNOSIS — Z7189 Other specified counseling: Secondary | ICD-10-CM | POA: Insufficient documentation

## 2016-06-02 DIAGNOSIS — E0590 Thyrotoxicosis, unspecified without thyrotoxic crisis or storm: Secondary | ICD-10-CM | POA: Diagnosis not present

## 2016-06-02 DIAGNOSIS — I10 Essential (primary) hypertension: Secondary | ICD-10-CM

## 2016-06-02 DIAGNOSIS — M159 Polyosteoarthritis, unspecified: Secondary | ICD-10-CM | POA: Diagnosis not present

## 2016-06-02 DIAGNOSIS — Z23 Encounter for immunization: Secondary | ICD-10-CM

## 2016-06-02 LAB — HM DIABETES FOOT EXAM

## 2016-06-02 NOTE — Assessment & Plan Note (Signed)
No evidence of recurrence Will check labs

## 2016-06-02 NOTE — Assessment & Plan Note (Signed)
Hopefully still good control Mild neuropathy--no Rx needed

## 2016-06-02 NOTE — Assessment & Plan Note (Signed)
See social history 

## 2016-06-02 NOTE — Assessment & Plan Note (Signed)
Uses the celebrex prn

## 2016-06-02 NOTE — Assessment & Plan Note (Signed)
I have personally reviewed the Medicare Annual Wellness questionnaire and have noted 1. The patient's medical and social history 2. Their use of alcohol, tobacco or illicit drugs 3. Their current medications and supplements 4. The patient's functional ability including ADL's, fall risks, home safety risks and hearing or visual             impairment. 5. Diet and physical activities 6. Evidence for depression or mood disorders  The patients weight, height, BMI and visual acuity have been recorded in the chart I have made referrals, counseling and provided education to the patient based review of the above and I have provided the pt with a written personalized care plan for preventive services.  I have provided you with a copy of your personalized plan for preventive services. Please take the time to review along with your updated medication list.  Flu vaccine ??consider 1 more colon in 2020 (polyps) Mammogram once more in 2 years Doesn't need paps

## 2016-06-02 NOTE — Progress Notes (Signed)
Subjective:    Patient ID: Angela Bentley, female    DOB: 1941-03-29, 75 y.o.   MRN: [id number]  HPI Here for Medicare wellness visit and follow up of chronic medical conditions Reviewed form and advanced directives (copy) Reviewed other doctors--recent gyn visit Occasional alcohol No tobacco No falls No depression or anhedonia Vision is okay Mild hearing loss and chronic tinnitus Independent with instrumental ADLs Exercises regularly Memory is okay --other than remembering names  Not checking sugars Discussed checking at least once or twice a month No low sugar reactions Not overly compliant with her diet--but tries to be careful No sores or pain in feet. Notices some change in sensation on bottoms of feet  No chest pain No SOB No headaches--or very rare Slight dizziness (balance) but no syncope  Current Outpatient Prescriptions on File Prior to Visit  Medication Sig Dispense Refill  . Ascorbic Acid (VITAMIN C) 100 MG CHEW Chew 200 mg by mouth daily as needed (immune system support).    . celecoxib (CELEBREX) 200 MG capsule Take 200 mg by mouth as needed for mild pain or moderate pain.     . fluticasone (VERAMYST) 27.5 MCG/SPRAY nasal spray Place 2 sprays into the nose daily as needed for allergies.     Marland Kitchen glimepiride (AMARYL) 1 MG tablet Take 1 tablet (1 mg total) by mouth daily with breakfast. 90 tablet 3  . metoprolol succinate (TOPROL-XL) 25 MG 24 hr tablet Take 1 tablet (25 mg total) by mouth daily. 90 tablet 3  . Multiple Vitamin (MULTIVITAMIN) tablet Take 1 tablet by mouth daily.    . pioglitazone (ACTOS) 45 MG tablet Take 1 tablet (45 mg total) by mouth daily. 90 tablet 3  . valsartan-hydrochlorothiazide (DIOVAN-HCT) 160-12.5 MG tablet Take 1 tablet by mouth daily. 90 tablet 3   No current facility-administered medications on file prior to visit.     Allergies  Allergen Reactions  . Penicillins     Has patient had a PCN reaction causing immediate rash,  facial/tongue/throat swelling, SOB or lightheadedness with hypotension: Yes Has patient had a PCN reaction causing severe rash involving mucus membranes or skin necrosis: Yes Has patient had a PCN reaction that required hospitalization No Has patient had a PCN reaction occurring within the last 10 years: No If all of the above answers are "NO", then may proceed with Cephalosporin use.    . Sulfa Antibiotics     Rash/itching  . Azithromycin Palpitations    Past Medical History:  Diagnosis Date  . Generalized osteoarthritis of multiple sites   . GERD (gastroesophageal reflux disease)   . History of DVT (deep vein thrombosis) 2016   estrogen and airflights. Rx with xarelto for 3 months  . Hypertension   . Hyperthyroidism    treated briefly in college  . Insomnia   . Retinal tear of left eye   . Rosacea   . Type 2 diabetes, controlled, with peripheral neuropathy ([place])   . Uterine fibroid     Past Surgical History:  Procedure Laterality Date  . CARPAL TUNNEL RELEASE Right    Dr Burney Gauze  . CATARACT EXTRACTION    . RETINAL TEAR REPAIR CRYOTHERAPY     10/09/13, then again 3/15    Family History  Problem Relation Age of Onset  . Diabetes Father   . Pancreatic cancer Father   . Diabetes Other     grandmother    Social History   Social History  . Marital status: Married  Spouse name: N/A  . Number of children: 3  . Years of education: N/A   Occupational History  . 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 Smoking status: Former Smoker    Quit date: 09/26/1993  . Smokeless tobacco: Never Used     Comment: quit in 1996  . Alcohol use 0.6 oz/week    1 Standard drinks or equivalent per week     Comment: occasional wine  . Drug use: No  . Sexual activity: No     Comment: husband vasectomy   Other Topics Concern  . Not on file   Social History Narrative   Has living will   Husband is health care POA-- or daughter Izora Gala   Would  accept resuscitation attempts   Would not want tube feeds if cognitively unaware   Review of Systems Appetite is great Weight fairly stable Mild ankle edema--mostly on left No treatment for thyroid now--needs reevaluation No heartburn or dysphagia Teeth are okay--but she grinds teeth. Keeps up with dentist Harmon Pier) Wears seat belt Left ankle pain and right low back/hands also. Uses celebrex prn Some skin issues due to pool water. No suspicious lesions Bowels are fine No urinary problems--concerned about the smell (but testing negative at gyn).  No incontinence.    Objective:   Physical Exam  Constitutional: She is oriented to person, place, and time. She appears well-developed and well-nourished. No distress.  HENT:  Mouth/Throat: Oropharynx is clear and moist. No oropharyngeal exudate.  Neck: Normal range of motion. Neck supple. No thyromegaly present.  Cardiovascular: Normal rate, regular rhythm, normal heart sounds and intact distal pulses.  Exam reveals no gallop.   No murmur heard. Pulmonary/Chest: Effort normal and breath sounds normal. No respiratory distress. She has no wheezes. She has no rales.  Abdominal: Soft. There is no tenderness.  Musculoskeletal: She exhibits no edema or tenderness.  Lymphadenopathy:    She has no cervical adenopathy.  Neurological: She is alert and oriented to person, place, and time.  President---"Trump, Ramonita Lab" [id number] Recall 2/3  Sensation mostly intact to monofilament  Skin:  No foot lesions  Psychiatric: She has a normal mood and affect. Her behavior is normal.          Assessment & Plan:

## 2016-06-02 NOTE — Progress Notes (Signed)
Pre visit review using our clinic review tool, if applicable. No additional management support is needed unless otherwise documented below in the visit note. 

## 2016-06-02 NOTE — Assessment & Plan Note (Signed)
BP Readings from Last 3 Encounters:  06/02/16 134/70  04/21/16 138/66  03/11/16 112/60   Good control

## 2016-06-02 NOTE — Addendum Note (Signed)
Addended by: Pilar Grammes on: 06/02/2016 03:21 PM   Modules accepted: Orders

## 2016-06-03 LAB — CBC WITH DIFFERENTIAL/PLATELET
Basophils Absolute: 0 10*3/uL (ref 0.0–0.1)
Basophils Relative: 0.6 % (ref 0.0–3.0)
Eosinophils Absolute: 0.1 10*3/uL (ref 0.0–0.7)
Eosinophils Relative: 1.6 % (ref 0.0–5.0)
HCT: 35.3 % — ABNORMAL LOW (ref 36.0–46.0)
Hemoglobin: 12.1 g/dL (ref 12.0–15.0)
Lymphocytes Relative: 22.5 % (ref 12.0–46.0)
Lymphs Abs: 1.6 10*3/uL (ref 0.7–4.0)
MCHC: 34.3 g/dL (ref 30.0–36.0)
MCV: 86.8 fl (ref 78.0–100.0)
Monocytes Absolute: 0.4 10*3/uL (ref 0.1–1.0)
Monocytes Relative: 6.2 % (ref 3.0–12.0)
Neutro Abs: 4.8 10*3/uL (ref 1.4–7.7)
Neutrophils Relative %: 69.1 % (ref 43.0–77.0)
Platelets: 221 10*3/uL (ref 150.0–400.0)
RBC: 4.07 Mil/uL (ref 3.87–5.11)
RDW: 14 % (ref 11.5–15.5)
WBC: 6.9 10*3/uL (ref 4.0–10.5)

## 2016-06-03 LAB — LIPID PANEL
Cholesterol: 192 mg/dL (ref 0–200)
HDL: 52.8 mg/dL (ref >39.00)
LDL Cholesterol: 106 mg/dL — ABNORMAL HIGH (ref 0–99)
NonHDL: 138.93
Total CHOL/HDL Ratio: 4
Triglycerides: 166 mg/dL — ABNORMAL HIGH (ref 0.0–149.0)
VLDL: 33.2 mg/dL (ref 0.0–40.0)

## 2016-06-03 LAB — COMPREHENSIVE METABOLIC PANEL
ALT: 19 U/L (ref 0–35)
AST: 20 U/L (ref 0–37)
Albumin: 4.3 g/dL (ref 3.5–5.2)
Alkaline Phosphatase: 61 U/L (ref 39–117)
BUN: 19 mg/dL (ref 6–23)
CO2: 33 mEq/L — ABNORMAL HIGH (ref 19–32)
Calcium: 9.4 mg/dL (ref 8.4–10.5)
Chloride: 101 mEq/L (ref 96–112)
Creatinine, Ser: 0.93 mg/dL (ref 0.40–1.20)
GFR: 62.16 mL/min (ref >60.00)
Glucose, Bld: 132 mg/dL — ABNORMAL HIGH (ref 70–99)
Potassium: 3.9 mEq/L (ref 3.5–5.1)
Sodium: 140 mEq/L (ref 135–145)
Total Bilirubin: 0.3 mg/dL (ref 0.2–1.2)
Total Protein: 7.1 g/dL (ref 6.0–8.3)

## 2016-06-03 LAB — T4, FREE: Free T4: 0.84 ng/dL (ref 0.60–1.60)

## 2016-06-03 LAB — HEMOGLOBIN A1C: Hgb A1c MFr Bld: 6.8 % — ABNORMAL HIGH (ref 4.6–6.5)

## 2016-06-03 LAB — TSH: TSH: 2.2 u[IU]/mL (ref 0.35–4.50)

## 2016-06-11 ENCOUNTER — Encounter: Payer: Self-pay | Admitting: Internal Medicine

## 2016-06-27 DIAGNOSIS — H1859 Other hereditary corneal dystrophies: Secondary | ICD-10-CM | POA: Diagnosis not present

## 2016-06-29 ENCOUNTER — Encounter: Payer: Self-pay | Admitting: Internal Medicine

## 2016-06-30 MED ORDER — HYDROCODONE-HOMATROPINE 5-1.5 MG/5ML PO SYRP: 5.0000 mL | ORAL_SOLUTION | Freq: Every evening | ORAL | 0 refills | Status: DC | PRN

## 2016-06-30 NOTE — Telephone Encounter (Signed)
I have told her this will be up front for her

## 2016-08-25 ENCOUNTER — Encounter: Payer: Self-pay | Admitting: Internal Medicine

## 2016-08-26 ENCOUNTER — Ambulatory Visit (INDEPENDENT_AMBULATORY_CARE_PROVIDER_SITE_OTHER): Payer: Medicare Other | Admitting: Internal Medicine

## 2016-08-26 ENCOUNTER — Encounter: Payer: Self-pay | Admitting: Internal Medicine

## 2016-08-26 VITALS — BP 126/78 | HR 76 | Temp 98.0°F | Wt 204.0 lb

## 2016-08-26 DIAGNOSIS — M545 Low back pain, unspecified: Secondary | ICD-10-CM | POA: Insufficient documentation

## 2016-08-26 MED ORDER — CELECOXIB 200 MG PO CAPS: 200.0000 mg | ORAL_CAPSULE | ORAL | 1 refills | Status: DC | PRN

## 2016-08-26 NOTE — Progress Notes (Signed)
Pre visit review using our clinic review tool, if applicable. No additional management support is needed unless otherwise documented below in the visit note. 

## 2016-08-26 NOTE — Progress Notes (Signed)
Subjective:    Patient ID: Angela Bentley, female    DOB: [birth year], 75 y.o.   MRN: [id number]  HPI Here due to low back pain Started 4 days ago Having sciatic pain on the right Feels different than her usual aches and pains No injury or sleep problems No lifting or other etiology  celebrex doesn't help TENS unit on buttock didn't help  From waist to buttock on right side Had sensation that leg would buckle when putting weight on it "Scraping" sensation to the pain  No radiation into the leg for the most part Able to walk okay on the leg--seems better today Hot shower really feels good  Current Outpatient Prescriptions on File Prior to Visit  Medication Sig Dispense Refill  . Ascorbic Acid (VITAMIN C) 100 MG CHEW Chew 200 mg by mouth daily as needed (immune system support).    . celecoxib (CELEBREX) 200 MG capsule Take 200 mg by mouth as needed for mild pain or moderate pain.     . fluticasone (VERAMYST) 27.5 MCG/SPRAY nasal spray Place 2 sprays into the nose daily as needed for allergies.     Marland Kitchen glimepiride (AMARYL) 1 MG tablet Take 1 tablet (1 mg total) by mouth daily with breakfast. 90 tablet 3  . metoprolol succinate (TOPROL-XL) 25 MG 24 hr tablet Take 1 tablet (25 mg total) by mouth daily. 90 tablet 3  . Multiple Vitamin (MULTIVITAMIN) tablet Take 1 tablet by mouth daily.    . pioglitazone (ACTOS) 45 MG tablet Take 1 tablet (45 mg total) by mouth daily. 90 tablet 3  . valsartan-hydrochlorothiazide (DIOVAN-HCT) 160-12.5 MG tablet Take 1 tablet by mouth daily. 90 tablet 3   No current facility-administered medications on file prior to visit.     Allergies  Allergen Reactions  . Penicillins     Has patient had a PCN reaction causing immediate rash, facial/tongue/throat swelling, SOB or lightheadedness with hypotension: Yes Has patient had a PCN reaction causing severe rash involving mucus membranes or skin necrosis: Yes Has patient had a PCN reaction that required  hospitalization No Has patient had a PCN reaction occurring within the last 10 years: No If all of the above answers are "NO", then may proceed with Cephalosporin use.    . Sulfa Antibiotics     Rash/itching  . Azithromycin Palpitations    Past Medical History:  Diagnosis Date  . Generalized osteoarthritis of multiple sites   . GERD (gastroesophageal reflux disease)   . History of DVT (deep vein thrombosis) 2016   estrogen and airflights. Rx with xarelto for 3 months  . Hypertension   . Hyperthyroidism    treated briefly in college  . Insomnia   . Retinal tear of left eye   . Rosacea   . Type 2 diabetes, controlled, with peripheral neuropathy ([place])   . Uterine fibroid     Past Surgical History:  Procedure Laterality Date  . CARPAL TUNNEL RELEASE Right    Dr Burney Gauze  . CATARACT EXTRACTION    . RETINAL TEAR REPAIR CRYOTHERAPY     10/09/13, then again 3/15    Family History  Problem Relation Age of Onset  . Diabetes Father   . Pancreatic cancer Father   . Diabetes Other     grandmother    Social History   Social History  . Marital status: Married    Spouse name: N/A  . Number of children: 3  . Years of education: N/A   Occupational History  .  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 Smoking status: Former Smoker    Quit date: 09/26/1993  . Smokeless tobacco: Never Used     Comment: quit in 1996  . Alcohol use 0.6 oz/week    1 Standard drinks or equivalent per week     Comment: occasional wine  . Drug use: No  . Sexual activity: No     Comment: husband vasectomy   Other Topics Concern  . Not on file   Social History Narrative   Has living will   Husband is health care POA-- or daughter Izora Gala   Would accept resuscitation attempts   Would not want tube feeds if cognitively unaware   Review of Systems Had some left over cyclobenzaprine--- this helped her sleep No loss of bladder or bowel function    Objective:    Physical Exam  Musculoskeletal:  No spine tenderness Localization of tenderness is right lumbar paraspinal area Normal ROM of hips SLR negative  Neurological:  Normal gait No leg weakness          Assessment & Plan:

## 2016-08-26 NOTE — Assessment & Plan Note (Signed)
Seems to be muscular Better already Discussed heat Okay occasional cyclobenzaprine

## 2016-10-31 ENCOUNTER — Other Ambulatory Visit: Payer: Self-pay

## 2016-10-31 ENCOUNTER — Encounter: Payer: Self-pay | Admitting: Emergency Medicine

## 2016-10-31 ENCOUNTER — Encounter: Payer: Self-pay | Admitting: Internal Medicine

## 2016-10-31 ENCOUNTER — Emergency Department
Admission: EM | Admit: 2016-10-31 | Discharge: 2016-10-31 | Disposition: A | Discharge status: Home / Self Care | Payer: Medicare Other | Attending: Emergency Medicine | Admitting: Emergency Medicine

## 2016-10-31 ENCOUNTER — Telehealth: Payer: Self-pay

## 2016-10-31 ENCOUNTER — Emergency Department: Payer: Medicare Other

## 2016-10-31 DIAGNOSIS — Z5321 Procedure and treatment not carried out due to patient leaving prior to being seen by health care provider: Secondary | ICD-10-CM | POA: Insufficient documentation

## 2016-10-31 DIAGNOSIS — Z7984 Long term (current) use of oral hypoglycemic drugs: Secondary | ICD-10-CM | POA: Insufficient documentation

## 2016-10-31 DIAGNOSIS — I10 Essential (primary) hypertension: Secondary | ICD-10-CM | POA: Insufficient documentation

## 2016-10-31 DIAGNOSIS — R002 Palpitations: Secondary | ICD-10-CM | POA: Diagnosis not present

## 2016-10-31 DIAGNOSIS — E119 Type 2 diabetes mellitus without complications: Secondary | ICD-10-CM | POA: Insufficient documentation

## 2016-10-31 DIAGNOSIS — R079 Chest pain, unspecified: Secondary | ICD-10-CM | POA: Diagnosis not present

## 2016-10-31 DIAGNOSIS — Z87891 Personal history of nicotine dependence: Secondary | ICD-10-CM | POA: Diagnosis not present

## 2016-10-31 DIAGNOSIS — R42 Dizziness and giddiness: Secondary | ICD-10-CM | POA: Diagnosis not present

## 2016-10-31 LAB — BASIC METABOLIC PANEL
Anion gap: 8 (ref 5–15)
BUN: 15 mg/dL (ref 6–20)
CO2: 30 mmol/L (ref 22–32)
Calcium: 9.5 mg/dL (ref 8.9–10.3)
Chloride: 101 mmol/L (ref 101–111)
Creatinine, Ser: 0.82 mg/dL (ref 0.44–1.00)
GFR calc Af Amer: >60 mL/min (ref >60)
GFR calc non Af Amer: >60 mL/min (ref >60)
Glucose, Bld: 129 mg/dL — ABNORMAL HIGH (ref 65–99)
Potassium: 3.7 mmol/L (ref 3.5–5.1)
Sodium: 139 mmol/L (ref 135–145)

## 2016-10-31 LAB — CBC
HCT: 37.7 % (ref 35.0–47.0)
Hemoglobin: 13 g/dL (ref 12.0–16.0)
MCH: 29.7 pg (ref 26.0–34.0)
MCHC: 34.5 g/dL (ref 32.0–36.0)
MCV: 86.2 fL (ref 80.0–100.0)
Platelets: 224 10*3/uL (ref 150–440)
RBC: 4.37 MIL/uL (ref 3.80–5.20)
RDW: 14 % (ref 11.5–14.5)
WBC: 8.6 10*3/uL (ref 3.6–11.0)

## 2016-10-31 LAB — TROPONIN I: Troponin I: <0.03 ng/mL (ref <0.03)

## 2016-10-31 NOTE — ED Triage Notes (Signed)
Pt presents with palpatations started this am, denies any chest pain. Pt states feels like there is a butterfly in her chest at time.

## 2016-10-31 NOTE — ED Notes (Signed)
Pt states she has a dog at home that she has to get to. Pt reports she does not have any chest pain and will make an appointment with her PCP tomorrow.

## 2016-10-31 NOTE — Telephone Encounter (Signed)
She apparently left the ER without being seen. Probably needs appt here tomorrow (Tuesday) if possible

## 2016-10-31 NOTE — Telephone Encounter (Signed)
Per chart review tab pt went to [facility] ED. 

## 2016-11-01 NOTE — Telephone Encounter (Signed)
Spoke to pt. She said the fluttering has settled down but her BP is higher. Made her an OV for Thursday.

## 2016-11-01 NOTE — Telephone Encounter (Signed)
I know you said you were emailing her. Do I still  need to set her up today or tomorrow?

## 2016-11-01 NOTE — Telephone Encounter (Signed)
I recommended a follow up if she still had symptoms so please check with her

## 2016-11-03 ENCOUNTER — Encounter: Payer: Self-pay | Admitting: Internal Medicine

## 2016-11-03 ENCOUNTER — Ambulatory Visit (INDEPENDENT_AMBULATORY_CARE_PROVIDER_SITE_OTHER): Payer: Medicare Other | Admitting: Internal Medicine

## 2016-11-03 VITALS — BP 138/80 | HR 87 | Temp 98.1°F | Wt 204.0 lb

## 2016-11-03 DIAGNOSIS — R002 Palpitations: Secondary | ICD-10-CM | POA: Insufficient documentation

## 2016-11-03 DIAGNOSIS — Z23 Encounter for immunization: Secondary | ICD-10-CM

## 2016-11-03 NOTE — Progress Notes (Signed)
Pre visit review using our clinic review tool, if applicable. No additional management support is needed unless otherwise documented below in the visit note. 

## 2016-11-03 NOTE — Progress Notes (Signed)
Subjective:    Patient ID: Angela Bentley, female    DOB: 11/21/1940, 76 y.o.   MRN: [id number]  HPI Here for ER follow up Had palpitations--checked into ER and had CXR, EKG and some blood work Hadn't been seen after a while so she left without being seen  She remembers another episode similar at [facility] Was told it might be related to past neck injury Was given narcotic but then it got better  Neck was sore last week Not bad though-- then to water aerobics without a problem 3 days ago--had palpitations (could tell it was her heart) Skipping, fast (but not very fast) and some skips Too late to be seen by nurse at [facility] Triaged to go toe ER  No chest pain No left arm pain No nausea or diaphoresis No SOB Was having palpitation to some degree when having the EKG  Neck has been sore May have exacerbated with the water aerobics Doing her exercises and it seems some better Did try celebrex once  Current Outpatient Prescriptions on File Prior to Visit  Medication Sig Dispense Refill  . Ascorbic Acid (VITAMIN C) 100 MG CHEW Chew 200 mg by mouth daily as needed (immune system support).    . celecoxib (CELEBREX) 200 MG capsule Take 1 capsule (200 mg total) by mouth as needed for mild pain or moderate pain. 30 capsule 1  . fluticasone (VERAMYST) 27.5 MCG/SPRAY nasal spray Place 2 sprays into the nose daily as needed for allergies.     Marland Kitchen glimepiride (AMARYL) 1 MG tablet Take 1 tablet (1 mg total) by mouth daily with breakfast. 90 tablet 3  . metoprolol succinate (TOPROL-XL) 25 MG 24 hr tablet Take 1 tablet (25 mg total) by mouth daily. 90 tablet 3  . Multiple Vitamin (MULTIVITAMIN) tablet Take 1 tablet by mouth daily.    . pioglitazone (ACTOS) 45 MG tablet Take 1 tablet (45 mg total) by mouth daily. 90 tablet 3  . valsartan-hydrochlorothiazide (DIOVAN-HCT) 160-12.5 MG tablet Take 1 tablet by mouth daily. 90 tablet 3   No current facility-administered medications on file  prior to visit.     Allergies  Allergen Reactions  . Penicillins     Has patient had a PCN reaction causing immediate rash, facial/tongue/throat swelling, SOB or lightheadedness with hypotension: Yes Has patient had a PCN reaction causing severe rash involving mucus membranes or skin necrosis: Yes Has patient had a PCN reaction that required hospitalization No Has patient had a PCN reaction occurring within the last 10 years: No If all of the above answers are "NO", then may proceed with Cephalosporin use.    . Sulfa Antibiotics     Rash/itching  . Azithromycin Palpitations    Past Medical History:  Diagnosis Date  . Generalized osteoarthritis of multiple sites   . GERD (gastroesophageal reflux disease)   . History of DVT (deep vein thrombosis) 2016   estrogen and airflights. Rx with xarelto for 3 months  . Hypertension   . Hyperthyroidism    treated briefly in college  . Insomnia   . Retinal tear of left eye   . Rosacea   . Type 2 diabetes, controlled, with peripheral neuropathy ([place])   . Uterine fibroid     Past Surgical History:  Procedure Laterality Date  . CARPAL TUNNEL RELEASE Right    Dr Burney Gauze  . CATARACT EXTRACTION    . RETINAL TEAR REPAIR CRYOTHERAPY     10/09/13, then again 3/15    Family  History  Problem Relation Age of Onset  . Diabetes Father   . Pancreatic cancer Father   . Diabetes Other     grandmother    Social History   Social History  . Marital status: Married    Spouse name: N/A  . Number of children: 3  . Years of education: N/A   Occupational History  . 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 Smoking status: Former Smoker    Quit date: 09/26/1993  . Smokeless tobacco: Never Used     Comment: quit in 1996  . Alcohol use 0.6 oz/week    1 Standard drinks or equivalent per week     Comment: occasional wine  . Drug use: No  . Sexual activity: No     Comment: husband vasectomy   Other Topics  Concern  . Not on file   Social History Narrative   Has living will   Husband is health care POA-- or daughter Izora Gala   Would accept resuscitation attempts   Would not want tube feeds if cognitively unaware   Review of Systems No fever or cough Not really sick Not sleeping well No heartburn or indigestion Slight dizziness at times     Objective:   Physical Exam  Constitutional: She appears well-nourished. No distress.  Neck: No thyromegaly present.  Fairly good ROM No localized tenderness  Cardiovascular: Normal rate, regular rhythm and normal heart sounds.  Exam reveals no gallop and no friction rub.   No murmur heard. Pulmonary/Chest: Effort normal and breath sounds normal. No respiratory distress. She has no wheezes. She has no rales.  Abdominal: Soft. There is no tenderness.  Musculoskeletal: She exhibits no edema.  Lymphadenopathy:    She has no cervical adenopathy.  Psychiatric: She has a normal mood and affect. Her behavior is normal.          Assessment & Plan:

## 2016-11-03 NOTE — Addendum Note (Signed)
Addended by: Pilar Grammes on: 11/03/2016 04:42 PM   Modules accepted: Orders

## 2016-11-03 NOTE — Assessment & Plan Note (Signed)
Reviewed ER records Initial EKG when she still had symptoms showed some PACs. She never felt it very fast Doubt atrial fib or SVT Nothing to suggest ischemia Recent thyroid function and other labs normal Reassured---no action now unless further sig symptoms

## 2016-11-12 ENCOUNTER — Encounter: Payer: Self-pay | Admitting: Internal Medicine

## 2016-11-14 ENCOUNTER — Encounter: Payer: Self-pay | Admitting: Internal Medicine

## 2016-11-14 DIAGNOSIS — R002 Palpitations: Secondary | ICD-10-CM

## 2016-11-19 ENCOUNTER — Other Ambulatory Visit: Payer: Self-pay | Admitting: Internal Medicine

## 2016-11-29 ENCOUNTER — Ambulatory Visit (INDEPENDENT_AMBULATORY_CARE_PROVIDER_SITE_OTHER): Payer: Medicare Other | Admitting: Cardiology

## 2016-11-29 ENCOUNTER — Encounter: Payer: Self-pay | Admitting: Cardiology

## 2016-11-29 VITALS — BP 126/68 | HR 90 | Ht 65.75 in | Wt 205.0 lb

## 2016-11-29 DIAGNOSIS — I10 Essential (primary) hypertension: Secondary | ICD-10-CM | POA: Diagnosis not present

## 2016-11-29 DIAGNOSIS — R002 Palpitations: Secondary | ICD-10-CM

## 2016-11-29 DIAGNOSIS — R0609 Other forms of dyspnea: Secondary | ICD-10-CM | POA: Diagnosis not present

## 2016-11-29 DIAGNOSIS — R0600 Dyspnea, unspecified: Secondary | ICD-10-CM

## 2016-11-29 NOTE — Patient Instructions (Addendum)
Schedule both at  [facility] has recommended that you wear an event monitor 30 DAYS . Event monitors are medical devices that record the heart's electrical activity. Doctors most often [place] these monitors to diagnose arrhythmias. Arrhythmias are problems with the speed or rhythm of the heartbeat. The monitor is a small, portable device. You can wear one while you do your normal daily activities. This is usually used to diagnose what is causing palpitations/syncope (passing out).   Your physician has requested that you have an exercise tolerance test. For further information please visit [email or web address]. Please also follow instruction sheet, as given.   NO CHANGE WITH CURRENT MEDICATIONS   Your physician wants you to follow-up in 2 MONTHS WITH DR HARDING.

## 2016-11-29 NOTE — Progress Notes (Signed)
PCP: Viviana Simpler, MD  Clinic Note: Chief Complaint  Patient presents with  . Palpitations    Consult    HPI: Angela Bentley is a 76 y.o. female with a PMH below who presents today for evaluation of palpitations at the referral from Dr. [name].. She is the wife of a patient of mine, Angela Bentley.  Angela Bentley was seen by her PCP on February 8 after an ER visit for palpitations. Baseline history of hyperthyroidism (while in college), diabetes type 2 with peripheral neuropathy, hypertension and history of DVT. She went into the ER -- had a chest x-ray EKG and some blood work, so she left prior to being seen by an M.D. Had some palpitations during EKG - noted heart skipping somewhat fast but not "very fast. --> TSH recently was normal. Also noted some neck soreness, no chest pain. No left arm pain. The next was was potentially exacerbated by water aerobics, do routine exercises help. Plan was expectant management, but she then had another spell about 2 hours of very fast heartbeat on the evening of February 17 - she does note that she had been out of [place] where she had some spicy shrimp cocktail and. Based on this, she asked to 7 appointment with me (I see her husband Angela Bentley).  EKG from ER showed sinus tachycardia with PACs.  Studies Reviewed: n/a  Interval History: Angela Bentley presents here today to evaluate her symptoms of palpitations. She still has these episodes of rapid irregular heartbeats that occurred off and on. She has also had some unusual left-sided arm pain symptoms. She describes for major episodes:  February 2011: Had pain down the left arm. She was evaluated to [facility]. Had a negative EKG and was told by the ER physician that she had a pinched nerve. Symptoms improved with Percocet, but her PCP did not believe that this was a true diagnosis.  June 2016: On a flight from [place] to [place] the crew did an emergency landing. She noted that she cut a cold while  flying from [place] the previous week. Apparently she was given Z-Pak to her flight to [place], and she was evaluated on this occasion, they felt like she may have had an allergic reaction to the drug. She did finally back with a large plane. Unfortunately she was diagnosed the DVT shortly thereafter and took 3 months Xarelto. (Question could she have a small PE) 2 recent episodes  10/31/2016: Following her "water aerobics "an brunch. She had an episode of heart fluttering, feeling as though is going fast. She also noted that her blood pressure increased to about 275/95. She was recommended to go to emergency room. Upon arrival to emergency room her blood pressure was still high but her EKG was normal. She described about 2-3 hours of fluttering and flutter sensation in her chest wall operating room. Unfortunately by the time she was given be seen by the physician, her symptoms abated. She went home and went to see her PCP 3 days later and all was well.  February 16: She felt another episode of flutters, thought sensation in her chest with high blood pressure that began about 10 PM. This was shortly after having a cocktail dinner as far as Angela Bentley is a celebration. Shortly after getting home the symptoms began and it lasted until about 1 PM. --> This final episode was what triggered referral for cardiology  Other than these major spells, she has still noted the fluttering and thought sensation just not  as robust or dramatic. She is concerned about the fact that her blood pressure goes high and her heart rate goes up and is not exactly sure what is going on. She additionally notes having some shortness of breath while doing her routine activities that she hadn't had before. She's been treating it to her weight, but is a little bit concerned because she doesn't think she gained that much. She has not had any chest tightness or pressure with rest or exertion or any resting dyspnea. No PND, orthopnea or edema.  With her palpitations she has not had any sensation of headache, blurred vision, dizziness, lightheadedness or syncope/near syncope.  No TIA/amaurosis fugax symptoms.  No claudication.  ROS: A comprehensive was performed. Review of Systems  Constitutional: Negative for malaise/fatigue and weight loss (Weight gain).  HENT: Negative for congestion and nosebleeds.   Respiratory: Positive for shortness of breath (Only with exertion). Negative for cough, sputum production and wheezing.   Gastrointestinal: Negative for blood in stool and melena.  Musculoskeletal: Negative for joint pain.  Neurological: Positive for dizziness (When her heart beat is going fast and fluttering). Negative for loss of consciousness.  Psychiatric/Behavioral: Negative for memory loss. The patient is nervous/anxious (When she has these heartbeat episodes).   All other systems reviewed and are negative.   Past Medical History:  Diagnosis Date  . Generalized osteoarthritis of multiple sites   . GERD (gastroesophageal reflux disease)   . History of DVT (deep vein thrombosis) 2016   estrogen and airflights. Rx with xarelto for 3 months  . Hypertension   . Hyperthyroidism    treated briefly in college  . Insomnia   . Retinal tear of left eye   . Rosacea   . Type 2 diabetes, controlled, with peripheral neuropathy ([place])   . Uterine fibroid     Past Surgical History:  Procedure Laterality Date  . CARPAL TUNNEL RELEASE Right    Dr Burney Gauze  . CATARACT EXTRACTION    . RETINAL TEAR REPAIR CRYOTHERAPY     10/09/13, then again 3/15    Current Meds  Medication Sig  . Ascorbic Acid (VITAMIN C) 100 MG CHEW Chew 200 mg by mouth daily as needed (immune system support).  . celecoxib (CELEBREX) 200 MG capsule Take 1 capsule (200 mg total) by mouth as needed for mild pain or moderate pain.  . fluticasone (VERAMYST) 27.5 MCG/SPRAY nasal spray Place 2 sprays into the nose daily as needed for allergies.   Marland Kitchen glimepiride  (AMARYL) 1 MG tablet TAKE 1 TABLET EVERY DAY WITH BREAKFAST  . metoprolol succinate (TOPROL-XL) 25 MG 24 hr tablet TAKE 1 TABLET EVERY DAY  . Multiple Vitamin (MULTIVITAMIN) tablet Take 1 tablet by mouth daily.  . pioglitazone (ACTOS) 45 MG tablet TAKE 1 TABLET EVERY DAY  . valsartan-hydrochlorothiazide (DIOVAN-HCT) 160-12.5 MG tablet TAKE 1 TABLET EVERY DAY    Allergies  Allergen Reactions  . Penicillins     Has patient had a PCN reaction causing immediate rash, facial/tongue/throat swelling, SOB or lightheadedness with hypotension: Yes Has patient had a PCN reaction causing severe rash involving mucus membranes or skin necrosis: Yes Has patient had a PCN reaction that required hospitalization No Has patient had a PCN reaction occurring within the last 10 years: No If all of the above answers are "NO", then may proceed with Cephalosporin use.    . Sulfa Antibiotics     Rash/itching  . Azithromycin Palpitations    Social History   Social History  .  Marital status: Married    Spouse name: N/A  . Number of children: 3  . Years of education: N/A   Occupational History  . 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 Smoking status: Former Smoker    Quit date: 09/26/1993  . Smokeless tobacco: Never Used     Comment: quit in 1996  . Alcohol use 0.6 oz/week    1 Standard drinks or equivalent per week     Comment: occasional wine  . Drug use: No  . Sexual activity: No     Comment: husband vasectomy   Other Topics Concern  . None   Social History Narrative   Has living will   Husband is health care POA-- or daughter Izora Gala   Would accept resuscitation attempts   Would not want tube feeds if cognitively unaware    family history includes Diabetes in her father and other; Pancreatic cancer in her father.  Wt Readings from Last 3 Encounters:  11/29/16 93 kg (205 lb)  11/03/16 92.5 kg (204 lb)  10/31/16 92.5 kg (204 lb)    PHYSICAL EXAM BP  126/68   Pulse 90   Ht 5' 5.75" (1.67 m)   Wt 93 kg (205 lb)   LMP 09/26/1997   SpO2 97%   BMI 33.34 kg/m  General appearance: alert, cooperative, appears stated age, no distress and Mildly obese. Well-nourished well-groomed HEENT: [place]/AT, EOMI, MMM, anicteric sclera Neck: no adenopathy, no carotid bruit and no JVD Lungs: clear to auscultation bilaterally, normal percussion bilaterally and non-labored Heart: regular rate and rhythm, S1& S2 normal, no murmur, click, rub or gallop; nondisplaced PMI. Occasional ectopy Abdomen: soft, non-tender; bowel sounds normal; no masses,  no organomegaly; no HJR Extremities: extremities normal, atraumatic, no cyanosis, and edema trivial with mild telangiectasias and spider veins as well as some small varicose veins. No venous stasis dermatitis changes noted. Pulses: 2+ and symmetric;  Skin: mobility and turgor normal, no evidence of bleeding or bruising and Mild spider veins/telangiectasias/varicose veins on bilateral lower extremities  Neurologic: Mental status: Alert, oriented, thought content appropriate Cranial nerves: normal (II-XII grossly intact)    Adult ECG Report - No EKG today EKG from 11/01/2016: Sinus tachycardia with PACs, rate 102 bpm; possible left atrial enlargement and nonspecific ST and T-wave changes  Other studies Reviewed: Additional studies/ records that were reviewed today include:  Recent Labs:   Lab Results  Component Value Date   CREATININE 0.82 10/31/2016   BUN 15 10/31/2016   NA 139 10/31/2016   K 3.7 10/31/2016   CL 101 10/31/2016   CO2 30 10/31/2016   Lab Results  Component Value Date   CHOL 192 06/02/2016   HDL 52.80 06/02/2016   LDLCALC 106 (H) 06/02/2016   TRIG 166.0 (H) 06/02/2016   CHOLHDL 4 06/02/2016     ASSESSMENT / PLAN: Problem List Items Addressed This Visit    DOE (dyspnea on exertion)    Probably multifactorial with wgt gain, but with palpitations, will assess with GXT (Graduated Exercise  Tolerance Test) -- this will look for potential cardiac ischemia, but also will evaluate to see if her palpitations occur with exercise or not.      Relevant Orders   EXERCISE TOLERANCE TEST   Cardiac event monitor   Hypertension (Chronic)    Well controlled - on Torpol & Diovan HCT Still has room if needed to increase BB.      Relevant Orders  EXERCISE TOLERANCE TEST   Cardiac event monitor   Palpitations - Primary    Her initial EKG did show frequent PACs. It is quite possible that she is feeling bigeminy given the fact she feels fluttering sensation with her irregular heartbeat. It's interesting that this is associated with accelerated hypertension however. I think the hypertension may be reactive.   She doesn't describe fast sensations more of just forceful beating. This would argue against it being A. fib or SVT. It would be more consistent with atrial bigeminy with compensatory pauses leading to more forceful second beat. Plan: I think we need to have her wear cardiac event monitor to see if there are any episodes that are symptomatic. Most likely the recommendation would be to further titrate her Toprol dose versus a when necessary dose. For now I recommended that she has an episode that doesn't go away on its own, she should take Her dose of Toprol.      Relevant Orders   EXERCISE TOLERANCE TEST   Cardiac event monitor      Current medicines are reviewed at length with the patient today. (+/- concerns) None  The following changes have been made: - Would take occasional when necessary dose of Toprol if her symptoms recur.  Patient Instructions  Schedule both at  [facility] has recommended that you wear an event monitor 30 DAYS . Event monitors are medical devices that record the heart's electrical activity. Doctors most often [place] these monitors to diagnose arrhythmias. Arrhythmias are problems with the speed or rhythm of the heartbeat. The monitor is a  small, portable device. You can wear one while you do your normal daily activities. This is usually used to diagnose what is causing palpitations/syncope (passing out).   Your physician has requested that you have an exercise tolerance test. For further information please visit [email or web address]. Please also follow instruction sheet, as given.   NO CHANGE WITH CURRENT MEDICATIONS   Your physician wants you to follow-up in 2 MONTHS WITH DR HARDING.      Studies Ordered:   Orders Placed This Encounter  Procedures  . EXERCISE TOLERANCE TEST  . Cardiac event monitor      Glenetta Hew, M.D., M.S. Interventional Cardiologist   Pager # [phone] Phone # [phone] [address] [place], [place] [address]

## 2016-12-01 ENCOUNTER — Encounter: Payer: Self-pay | Admitting: Cardiology

## 2016-12-01 DIAGNOSIS — R0609 Other forms of dyspnea: Secondary | ICD-10-CM | POA: Insufficient documentation

## 2016-12-01 DIAGNOSIS — R0600 Dyspnea, unspecified: Secondary | ICD-10-CM | POA: Insufficient documentation

## 2016-12-01 NOTE — Assessment & Plan Note (Signed)
Probably multifactorial with wgt gain, but with palpitations, will assess with GXT (Graduated Exercise Tolerance Test) -- this will look for potential cardiac ischemia, but also will evaluate to see if her palpitations occur with exercise or not.

## 2016-12-01 NOTE — Assessment & Plan Note (Signed)
Well controlled - on Torpol & Diovan HCT Still has room if needed to increase BB.

## 2016-12-01 NOTE — Assessment & Plan Note (Signed)
Her initial EKG did show frequent PACs. It is quite possible that she is feeling bigeminy given the fact she feels fluttering sensation with her irregular heartbeat. It's interesting that this is associated with accelerated hypertension however. I think the hypertension may be reactive.   She doesn't describe fast sensations more of just forceful beating. This would argue against it being A. fib or SVT. It would be more consistent with atrial bigeminy with compensatory pauses leading to more forceful second beat. Plan: I think we need to have her wear cardiac event monitor to see if there are any episodes that are symptomatic. Most likely the recommendation would be to further titrate her Toprol dose versus a when necessary dose. For now I recommended that she has an episode that doesn't go away on its own, she should take Her dose of Toprol.

## 2016-12-06 ENCOUNTER — Other Ambulatory Visit: Payer: Medicare Other

## 2016-12-07 ENCOUNTER — Other Ambulatory Visit (INDEPENDENT_AMBULATORY_CARE_PROVIDER_SITE_OTHER): Payer: Medicare Other

## 2016-12-07 ENCOUNTER — Telehealth: Payer: Self-pay | Admitting: *Deleted

## 2016-12-07 DIAGNOSIS — E1142 Type 2 diabetes mellitus with diabetic polyneuropathy: Secondary | ICD-10-CM | POA: Diagnosis not present

## 2016-12-07 LAB — HEMOGLOBIN A1C: Hgb A1c MFr Bld: 7.4 % — ABNORMAL HIGH (ref 4.6–6.5)

## 2016-12-07 NOTE — Telephone Encounter (Signed)
No answer. Called and left message on number, ok per DPR, with reminder about stress test tomorrow at 0830 and to not take her metoprolol in the morning.

## 2016-12-08 ENCOUNTER — Telehealth: Payer: Self-pay | Admitting: *Deleted

## 2016-12-08 ENCOUNTER — Other Ambulatory Visit: Payer: Self-pay | Admitting: *Deleted

## 2016-12-08 ENCOUNTER — Encounter: Payer: Self-pay | Admitting: *Deleted

## 2016-12-08 ENCOUNTER — Ambulatory Visit (INDEPENDENT_AMBULATORY_CARE_PROVIDER_SITE_OTHER): Payer: Medicare Other

## 2016-12-08 ENCOUNTER — Telehealth: Payer: Self-pay | Admitting: Cardiology

## 2016-12-08 ENCOUNTER — Ambulatory Visit: Payer: Medicare Other

## 2016-12-08 ENCOUNTER — Encounter: Payer: Self-pay | Admitting: Internal Medicine

## 2016-12-08 DIAGNOSIS — R002 Palpitations: Secondary | ICD-10-CM | POA: Diagnosis not present

## 2016-12-08 DIAGNOSIS — R0609 Other forms of dyspnea: Secondary | ICD-10-CM

## 2016-12-08 DIAGNOSIS — I10 Essential (primary) hypertension: Secondary | ICD-10-CM

## 2016-12-08 NOTE — Telephone Encounter (Signed)
Information received. [employer] monitor registered per procedure.

## 2016-12-08 NOTE — Progress Notes (Signed)
Lets have her double her Valsartan-HCTZ for a week & reschedule for next week.  [place]

## 2016-12-08 NOTE — Telephone Encounter (Signed)
Received incoming call from patient. Patient wants to reschedule GXT to next week.  Rescheduled for 12/15/16 at 0930. Patient verbalized understanding.

## 2016-12-08 NOTE — Progress Notes (Signed)
Patient notified and verbalized understanding to double her valsartan/hctz until stress test next week on 12/15/16 at 0930.

## 2016-12-08 NOTE — Telephone Encounter (Signed)
Pt states the # for her heart monitor. [phone]. Please call if needed.

## 2016-12-08 NOTE — Progress Notes (Signed)
Patient presented today for Treadmill stress test. Patient's blood pressure at initiation of procedure was 193/88, HR 96. Retaken and second reading 190/86, HR 88. Patient denies chest pain, SOB, headache, or dizziness. She last took her daily Toprol XL 25 mg and Valsartan/HCTZ 160/12.5mg  on 12/07/16 around 12 noon as usual. Discussed with Dr Fletcher Anon who reviewed and advised not to perform stress test today and to notify ordering physician, Dr Ellyn Hack.

## 2016-12-08 NOTE — Telephone Encounter (Signed)
  Lets have her double her Valsartan-HCTZ for a week & reschedule for next week.  [place]      Progress Notes by Vanessa Ralphs, RN at 12/08/2016 9:44 AM   Author: Vanessa Ralphs, RN Author Type: Registered Nurse Filed: 12/08/2016 9:50 AM  Note Status: Sign at close encounter Cosign: [place] Not Required Encounter Date: 12/08/2016  Editor: Vanessa Ralphs, RN (Registered Nurse)    Patient presented today for Treadmill stress test. Patient's blood pressure at initiation of procedure was 193/88, HR 96. Retaken and second reading 190/86, HR 88. Patient denies chest pain, SOB, headache, or dizziness. She last took her daily Toprol XL 25 mg and Valsartan/HCTZ 160/12.5mg  on 12/07/16 around 12 noon as usual. Discussed with Dr Fletcher Anon who reviewed and advised not to perform stress test today and to notify ordering physician, Dr Ellyn Hack.

## 2016-12-08 NOTE — Telephone Encounter (Signed)
S/w patient. She verbalized understanding of instructions. She then said she will be out town starting next Tuesday 12/13/16 to 12/20/16. Patient scheduled for GXT on 12/21/16 at 0830. She will start taking 2 tablets of Valsartan/HCTZ daily and monitor her BP until then.  Will route to Dr Ellyn Hack as Juluis Rainier.

## 2016-12-15 ENCOUNTER — Other Ambulatory Visit: Payer: Self-pay | Admitting: *Deleted

## 2016-12-15 ENCOUNTER — Ambulatory Visit (INDEPENDENT_AMBULATORY_CARE_PROVIDER_SITE_OTHER): Payer: Medicare Other

## 2016-12-15 DIAGNOSIS — R002 Palpitations: Secondary | ICD-10-CM | POA: Diagnosis not present

## 2016-12-29 ENCOUNTER — Encounter: Payer: Self-pay | Admitting: Internal Medicine

## 2016-12-29 ENCOUNTER — Encounter: Payer: Self-pay | Admitting: Cardiology

## 2017-01-17 ENCOUNTER — Encounter: Payer: Self-pay | Admitting: Internal Medicine

## 2017-02-02 LAB — EXERCISE TOLERANCE TEST
Estimated workload: 6.2 METS
Exercise duration (min): 4 min
Exercise duration (sec): 25 s
MPHR: 143 {beats}/min
Peak HR: 146 {beats}/min
Percent HR: 102 %
Rest HR: 90 {beats}/min

## 2017-02-03 ENCOUNTER — Ambulatory Visit (INDEPENDENT_AMBULATORY_CARE_PROVIDER_SITE_OTHER): Payer: Medicare Other | Admitting: Cardiology

## 2017-02-03 ENCOUNTER — Encounter: Payer: Self-pay | Admitting: Cardiology

## 2017-02-03 VITALS — BP 168/86 | HR 86 | Ht 66.0 in | Wt 207.0 lb

## 2017-02-03 DIAGNOSIS — R002 Palpitations: Secondary | ICD-10-CM | POA: Diagnosis not present

## 2017-02-03 DIAGNOSIS — I10 Essential (primary) hypertension: Secondary | ICD-10-CM | POA: Diagnosis not present

## 2017-02-03 DIAGNOSIS — Z86718 Personal history of other venous thrombosis and embolism: Secondary | ICD-10-CM

## 2017-02-03 DIAGNOSIS — R0609 Other forms of dyspnea: Secondary | ICD-10-CM | POA: Diagnosis not present

## 2017-02-03 DIAGNOSIS — I480 Paroxysmal atrial fibrillation: Secondary | ICD-10-CM | POA: Insufficient documentation

## 2017-02-03 DIAGNOSIS — R0600 Dyspnea, unspecified: Secondary | ICD-10-CM

## 2017-02-03 MED ORDER — RIVAROXABAN 20 MG PO TABS: 20.0000 mg | ORAL_TABLET | Freq: Every day | ORAL | 3 refills | Status: DC

## 2017-02-03 MED ORDER — METOPROLOL SUCCINATE ER 50 MG PO TB24: 50.0000 mg | ORAL_TABLET | Freq: Every day | ORAL | 3 refills | Status: DC

## 2017-02-03 MED ORDER — METOPROLOL SUCCINATE ER 50 MG PO TB24
50.0000 mg | ORAL_TABLET | Freq: Every day | ORAL | 3 refills | Status: DC
Start: 2017-02-03 — End: 2017-02-03

## 2017-02-03 NOTE — Progress Notes (Signed)
PCP: Angela Carbon, MD  Clinic Note: Chief Complaint  Patient presents with  . Follow-up    Palpitations  . Edema    both feet    HPI: Angela Bentley is a 76 y.o. female with a PMH below who presents today for 2 month follow-up to discuss palpitations. She was evaluated with the cardiac event monitor and GXT. Angela Bentley was initially seen on 11/29/2016 at the request of her PCP Dr. [name]. She is the wife of Angela Bentley, who is also my patient.  Baseline history of hyperthyroidism (while in college), diabetes type 2 with peripheral neuropathy, hypertension and history of DVT.  She went into the ER -- had a chest x-ray EKG and some blood work, so she left prior to being seen by an M.D. Had some palpitations during EKG - noted heart skipping somewhat fast but not "very fast"  Studies Personally Reviewed - (if available, images/films reviewed: From Epic Chart or Care Everywhere)  Cardiac event monitor demonstrated one episode of a symptomatically atrial fibrillation - with rapid rate - 154 bpm  GXT: She was hypertensive before during and after the test. Did have appropriate response. Exaggerated heart rate response to exercise however. She tells me that she could've walked further and was told that she did not have to. Therefore her exercise tolerance is probably inaccurate. No PACs or PVCs or arrhythmias noted.  Interval History: Angela Bentley presents today really without any significant symptoms. She has not really noticed that many "flutter sensations" since her last visit. She says maybe 1 or 2. She thinks she may have felt one during the time she is wearing a monitor, but can't remember now. Nothing lasted very long. Nothing to suggest symptoms of syncope/near sig. TSH is amaurosis fugax. She has some mild pedal edema but otherwise no orthopnea or PND. She isn't very active, but with it with what she does she denies any chest tightness or pressure or dyspnea.  No  syncope/near  syncope. No TIA/amaurosis fugax symptoms. No melena, hematochezia, hematuria, or epstaxis. No claudication.  ROS: A comprehensive was performed. Review of Systems  Constitutional: Negative for malaise/fatigue.  Respiratory: Positive for shortness of breath (Only with exertion).   Musculoskeletal: Positive for back pain.  Neurological: Negative for dizziness.  Psychiatric/Behavioral: Negative for memory loss. The patient is not nervous/anxious.   All other systems reviewed and are negative.  I have reviewed and (if needed) personally updated the patient's problem list, medications, allergies, past medical and surgical history, social and family history.   Past Medical History:  Diagnosis Date  . Generalized osteoarthritis of multiple sites   . GERD (gastroesophageal reflux disease)   . History of DVT (deep vein thrombosis) 2016   estrogen and airflights. Rx with xarelto for 3 months  . Hypertension   . Hyperthyroidism    treated briefly in college  . Insomnia   . Retinal tear of left eye   . Rosacea   . Type 2 diabetes, controlled, with peripheral neuropathy ([place])   . Uterine fibroid     Past Surgical History:  Procedure Laterality Date  . CARPAL TUNNEL RELEASE Right    Dr Burney Gauze  . CATARACT EXTRACTION    . RETINAL TEAR REPAIR CRYOTHERAPY     10/09/13, then again 3/15    Current Meds  Medication Sig  . Ascorbic Acid (VITAMIN C) 100 MG CHEW Chew 200 mg by mouth daily as needed (immune system support).  . celecoxib (CELEBREX) 200 MG capsule Take 1  capsule (200 mg total) by mouth as needed for mild pain or moderate pain.  . fluticasone (VERAMYST) 27.5 MCG/SPRAY nasal spray Place 2 sprays into the nose daily as needed for allergies.   Marland Kitchen glimepiride (AMARYL) 1 MG tablet TAKE 1 TABLET EVERY DAY WITH BREAKFAST  . metoprolol succinate (TOPROL-XL) 50 MG 24 hr tablet Take 1 tablet (50 mg total) by mouth daily.  . Multiple Vitamin (MULTIVITAMIN) tablet Take 1 tablet by mouth  daily.  . pioglitazone (ACTOS) 45 MG tablet TAKE 1 TABLET EVERY DAY  . valsartan-hydrochlorothiazide (DIOVAN-HCT) 160-12.5 MG tablet TAKE 1 TABLET EVERY DAY  . [DISCONTINUED] metoprolol succinate (TOPROL-XL) 25 MG 24 hr tablet TAKE 1 TABLET EVERY DAY  . [DISCONTINUED] metoprolol succinate (TOPROL-XL) 50 MG 24 hr tablet Take 1 tablet (50 mg total) by mouth daily.    Allergies  Allergen Reactions  . Penicillins     Has patient had a PCN reaction causing immediate rash, facial/tongue/throat swelling, SOB or lightheadedness with hypotension: Yes Has patient had a PCN reaction causing severe rash involving mucus membranes or skin necrosis: Yes Has patient had a PCN reaction that required hospitalization No Has patient had a PCN reaction occurring within the last 10 years: No If all of the above answers are "NO", then may proceed with Cephalosporin use.    . Sulfa Antibiotics     Rash/itching  . Azithromycin Palpitations    Social History   Social History  . Marital status: Married    Spouse name: N/A  . Number of children: 3  . Years of education: N/A   Occupational History  . 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 Smoking status: Former Smoker    Quit date: 09/26/1993  . Smokeless tobacco: Never Used     Comment: quit in 1996  . Alcohol use 0.6 oz/week    1 Standard drinks or equivalent per week     Comment: occasional wine  . Drug use: No  . Sexual activity: No     Comment: husband vasectomy   Other Topics Concern  . None   Social History Narrative   Has living will   Husband is health care POA-- or daughter Izora Gala   Would accept resuscitation attempts   Would not want tube feeds if cognitively unaware    family history includes Diabetes in her father and other; Pancreatic cancer in her father.  Wt Readings from Last 3 Encounters:  02/03/17 207 lb (93.9 kg)  11/29/16 205 lb (93 kg)  11/03/16 204 lb (92.5 kg)    PHYSICAL  EXAM BP (!) 168/86   Pulse 86   Ht 5\' 6"  (1.676 m)   Wt 207 lb (93.9 kg)   LMP 09/26/1997   SpO2 93%   BMI 33.41 kg/m  General appearance: alert, cooperative, appears stated age, no distress. Mildly obese; well-nourished well-groomed HEENT: [place]/AT, EOMI, MMM, anicteric sclera Neck: no adenopathy, no carotid bruit and no JVD Lungs: clear to auscultation bilaterally, normal percussion bilaterally and non-labored Heart: regular rate and rhythm, S1 &S2 normal, no murmur, click, rub or gallop; nondisplaced PMI. Abdomen: soft, non-tender; bowel sounds normal; no masses,  no organomegaly; no HJR Extremities: extremities normal, atraumatic, no cyanosis, and edema -trivial Pulses: 2+ and symmetric;  Neurologic: Mental status: Alert & oriented x 3, thought content appropriate; non-focal exam.  Pleasant mood & affect.   Adult ECG Report n/a  Other studies Reviewed: Additional studies/ records that were  reviewed today include:  Recent Labs:   Lab Results  Component Value Date   CREATININE 0.82 10/31/2016   BUN 15 10/31/2016   NA 139 10/31/2016   K 3.7 10/31/2016   CL 101 10/31/2016   CO2 30 10/31/2016     This patients CHA2DS2-VASc Score and unadjusted Ischemic Stroke Rate (% per year) is equal to 7.2 % stroke rate/year from a score of 5  Above score calculated as 1 point each if present [CHF, HTN, DM, Vascular=MI/PAD/Aortic Plaque, Age if 65-74, or Female] Above score calculated as 2 points each if present [Age > 75, or Stroke/TIA/TE]   ASSESSMENT / PLAN: Problem List Items Addressed This Visit    DOE (dyspnea on exertion)    GXT was nonischemic. She says she probably can walk further than the tach allow her. I suspect that her excess tolerance is relatively decent. She didn't note significant dyspnea either.  No heart failure symptoms. Low threshold to consider echocardiogram.      History of DVT (deep vein thrombosis)    She has been on Xarelto before and did fine. We will  defer you Xarelto for her A. fib anticoagulation.      Hypertension (Chronic)    Blood pressure is high today. Have room to titrate up the beta blocker based on her rapid heart rate response in A. fib on monitor.  For now would simply monitor him for recurrent symptoms on current medications.      Relevant Medications   rivaroxaban (XARELTO) 20 MG TABS tablet   metoprolol succinate (TOPROL-XL) 50 MG 24 hr tablet   PAF (paroxysmal atrial fibrillation) (HCC) - Primary (Chronic)    1 episode of PAF noted on her monitor. I don't know if she is symptomatic or not with these episodes, therefore I am a bit concerned that she may be having more than we know. This patients CHA2DS2-VASc Score and unadjusted Ischemic Stroke Rate (% per year) is equal to 7.2 % stroke rate/year from a score of 5.  We talked about the importance of anticoagulation for stroke prevention. Plan: Start Xarelto 20 mg daily. Increase metoprolol succinate to 50mg .      Relevant Medications   rivaroxaban (XARELTO) 20 MG TABS tablet   metoprolol succinate (TOPROL-XL) 50 MG 24 hr tablet   Palpitations    I suspect her palpitations may very well be PACs and PVCs, but she did have PAF noted on monitor.  Just not sure what her Kalman Shan level is. She was asymptomatic with this episode.         Current medicines are reviewed at length with the patient today. (+/- concerns)  n/a  The following changes have been made: - -see below  Patient Instructions  Medication Instructions:   START XARELTO 20 MG ONCE DAILY  INCREASE METOPROLOL SUCC TO 50 MG ONCE DAILY= 2 OF THE 25 MG TABLETS ONCE DAILY  Follow-Up:  Your physician wants you to follow-up in: [place] will receive a reminder letter in the mail two months in advance. If you don't receive a letter, please call our office to schedule the follow-up appointment.   If you need a refill on your cardiac medications before your next appointment, please  call your pharmacy.      Studies Ordered:   No orders of the defined types were placed in this encounter.     Glenetta Hew, M.D., M.S. Interventional Cardiologist   Pager # [phone] Phone # [phone] [address]  [place], [place] [address]

## 2017-02-03 NOTE — Patient Instructions (Signed)
Medication Instructions:   START XARELTO 20 MG ONCE DAILY  INCREASE METOPROLOL SUCC TO 50 MG ONCE DAILY= 2 OF THE 25 MG TABLETS ONCE DAILY  Follow-Up:  Your physician wants you to follow-up in: [place] DR Ellyn Hack You will receive a reminder letter in the mail two months in advance. If you don't receive a letter, please call our office to schedule the follow-up appointment.   If you need a refill on your cardiac medications before your next appointment, please call your pharmacy.

## 2017-02-05 ENCOUNTER — Encounter: Payer: Self-pay | Admitting: Cardiology

## 2017-02-05 NOTE — Assessment & Plan Note (Signed)
She has been on Xarelto before and did fine. We will defer you Xarelto for her A. fib anticoagulation.

## 2017-02-05 NOTE — Assessment & Plan Note (Signed)
1 episode of PAF noted on her monitor. I don't know if she is symptomatic or not with these episodes, therefore I am a bit concerned that she may be having more than we know. This patients CHA2DS2-VASc Score and unadjusted Ischemic Stroke Rate (% per year) is equal to 7.2 % stroke rate/year from a score of 5.  We talked about the importance of anticoagulation for stroke prevention. Plan: Start Xarelto 20 mg daily. Increase metoprolol succinate to 50mg .

## 2017-02-05 NOTE — Assessment & Plan Note (Signed)
Blood pressure is high today. Have room to titrate up the beta blocker based on her rapid heart rate response in A. fib on monitor.  For now would simply monitor him for recurrent symptoms on current medications.

## 2017-02-05 NOTE — Assessment & Plan Note (Signed)
I suspect her palpitations may very well be PACs and PVCs, but she did have PAF noted on monitor.  Just not sure what her Kalman Shan level is. She was asymptomatic with this episode.

## 2017-02-05 NOTE — Assessment & Plan Note (Signed)
GXT was nonischemic. She says she probably can walk further than the tach allow her. I suspect that her excess tolerance is relatively decent. She didn't note significant dyspnea either.  No heart failure symptoms. Low threshold to consider echocardiogram.

## 2017-02-07 ENCOUNTER — Encounter: Payer: Self-pay | Admitting: Internal Medicine

## 2017-02-07 ENCOUNTER — Encounter: Payer: Self-pay | Admitting: Cardiology

## 2017-02-10 DIAGNOSIS — H31002 Unspecified chorioretinal scars, left eye: Secondary | ICD-10-CM | POA: Diagnosis not present

## 2017-02-10 DIAGNOSIS — H52203 Unspecified astigmatism, bilateral: Secondary | ICD-10-CM | POA: Diagnosis not present

## 2017-02-10 DIAGNOSIS — E119 Type 2 diabetes mellitus without complications: Secondary | ICD-10-CM | POA: Diagnosis not present

## 2017-02-10 DIAGNOSIS — H1851 Endothelial corneal dystrophy: Secondary | ICD-10-CM | POA: Diagnosis not present

## 2017-02-10 LAB — HM DIABETES EYE EXAM

## 2017-02-22 ENCOUNTER — Encounter: Payer: Self-pay | Admitting: Internal Medicine

## 2017-04-02 DIAGNOSIS — R42 Dizziness and giddiness: Secondary | ICD-10-CM | POA: Diagnosis not present

## 2017-04-02 DIAGNOSIS — E119 Type 2 diabetes mellitus without complications: Secondary | ICD-10-CM | POA: Diagnosis not present

## 2017-04-02 DIAGNOSIS — I10 Essential (primary) hypertension: Secondary | ICD-10-CM | POA: Diagnosis not present

## 2017-04-03 ENCOUNTER — Telehealth: Payer: Self-pay

## 2017-04-03 NOTE — Telephone Encounter (Signed)
PLEASE NOTE: All timestamps contained within this report are represented as [place] Standard Time. CONFIDENTIALTY NOTICE: This fax transmission is intended only for the addressee. It contains information that is legally privileged, confidential or otherwise protected from use or disclosure. If you are not the intended recipient, you are strictly prohibited from reviewing, disclosing, copying using or disseminating any of this information or taking any action in reliance on or regarding this information. If you have received this fax in error, please notify us immediately by telephone so that we can arrange for its return to [place]. Phone: [phone], Toll-Free: [phone], Fax: [phone] Page: 1 of 1 Call Id: [id number] [place] Night - Client Nonclinical Telephone Record [place] Night - Client Client Site [place] - Night Physician Viviana Simpler - MD Contact Type Call Who Is Calling Patient / Member / Family / Caregiver Call Type PC Page Now Reason for Call Symptomatic / Request for Health Information Initial Comment Caller states, patient needs to speak to dr. bp high and elevated. Heart issues, diabetic. The NP would like to have Dr. assist her. Patient Name Angela Bentley Patient DOB 1941-01-21 Paging DoctorName Phone DateTime Result/Outcome Message Type Notes Renford Dills - MD [phone] 04/02/2017 10:09:25 AM Called On Call Provider - Reached Doctor Paged Renford Dills - MD 04/02/2017 10:09:33 AM Spoke with On Call - General Message Result Call Closed By: Kelby Aline Transaction Date/Time: 04/02/2017 9:57:18 AM (ET)

## 2017-04-03 NOTE — Telephone Encounter (Signed)
I don't think there is any reason she should not fly. If no fever or breathing problems and she was seen yesterday (and presumably no significant diagnosis), I think it is okay for her to fly. Should be rechecked if not improving in the next few days after getting back

## 2017-04-03 NOTE — Telephone Encounter (Signed)
Please check on her

## 2017-04-03 NOTE — Telephone Encounter (Signed)
Pt was seen on 04/02/17 at [facility]. Pt's son received cb from Dr Damita Dunnings. Now pt is not a lot better; when pt walks she is dizzy with slight nausea.when pt sits no dizziness.  Pt has been drinking and does not think she is dehydrated. Pt has had h/a for last 2 mornings. Took tylenol and no h/a now. Pt is supposed to fly back home on 04/04/17. Pt does not know if she should fly feeling this way. Pt request cb with Dr Alla German suggestion. Pt does not feel 100% better today and pt thought she would be 100 % better. Pt has been there 10 days and needs to return home tomorrow.Please advise. Pt request cb.[facility].

## 2017-04-03 NOTE — Telephone Encounter (Signed)
Spoke to pt. She will try the meclizine. May make an appt later in the week.

## 2017-04-03 NOTE — Telephone Encounter (Signed)
See other note She may want to try a meclizine 25mg  every few hours for the flight and driving

## 2017-04-03 NOTE — Telephone Encounter (Signed)
PLEASE NOTE: All timestamps contained within this report are represented as [place] Standard Time. CONFIDENTIALTY NOTICE: This fax transmission is intended only for the addressee. It contains information that is legally privileged, confidential or otherwise protected from use or disclosure. If you are not the intended recipient, you are strictly prohibited from reviewing, disclosing, copying using or disseminating any of this information or taking any action in reliance on or regarding this information. If you have received this fax in error, please notify us immediately by telephone so that we can arrange for its return to [place]. Phone: [phone], Toll-Free: [phone], Fax: [phone] Page: 1 of 1 Call Id: [id number] [place] Day - Client Nonclinical Telephone Record [employer] Day - Client Client Site [place] - Day Physician Viviana Simpler - MD Contact Type Call Who Is Calling Patient / Member / Family / Caregiver Caller Name anon Caller Phone Number Declined to provide Reason for Call Symptomatic / Request for Health Information Initial Comment caller has been experiencing dizziness . on call doctor said she was dehydrated but 24 hours later she is still dizzy . Call Closed By: Fayrene Helper Transaction Date/Time: 04/03/2017 11:59:57 AM (ET)

## 2017-04-06 ENCOUNTER — Encounter: Payer: Self-pay | Admitting: Internal Medicine

## 2017-04-06 ENCOUNTER — Ambulatory Visit (INDEPENDENT_AMBULATORY_CARE_PROVIDER_SITE_OTHER): Payer: Medicare Other | Admitting: Internal Medicine

## 2017-04-06 VITALS — BP 136/74 | HR 74 | Temp 97.8°F | Wt 203.0 lb

## 2017-04-06 DIAGNOSIS — I480 Paroxysmal atrial fibrillation: Secondary | ICD-10-CM | POA: Diagnosis not present

## 2017-04-06 DIAGNOSIS — R42 Dizziness and giddiness: Secondary | ICD-10-CM | POA: Diagnosis not present

## 2017-04-06 MED ORDER — METOPROLOL SUCCINATE ER 50 MG PO TB24: 25.0000 mg | ORAL_TABLET | Freq: Every day | ORAL | 0 refills | Status: DC

## 2017-04-06 MED ORDER — MECLIZINE HCL 25 MG PO TABS: 25.0000 mg | ORAL_TABLET | Freq: Three times a day (TID) | ORAL | 1 refills | Status: DC | PRN

## 2017-04-06 NOTE — Assessment & Plan Note (Signed)
Feels bad since metoprolol increased Will cut back to 25mg --rate is okay

## 2017-04-06 NOTE — Progress Notes (Signed)
Subjective:    Patient ID: Angela Bentley, female    DOB: 03/13/1941, 76 y.o.   MRN: [id number]  HPI Here with husband due to dizziness  Trip to [facility], then back to [place] Felt fine at first but the next morning, she awoke very dizzy. Head was spinning (had to hold onto wall). Mild headache Didn't sleep well--wonders if this is the cause of the problems  Had been eating and drinking normally Was hot but there was no air conditioning  Seen that day at urgent care Diagnosed with dehydration Sugar was okay there ---145  BP up slightly  Chronic tinnitus that worsened on left No change in hearing  Doesn't feel well since metoprolol increased Cognitive and energy worsened  Current Outpatient Prescriptions on File Prior to Visit  Medication Sig Dispense Refill  . Ascorbic Acid (VITAMIN C) 100 MG CHEW Chew 200 mg by mouth daily as needed (immune system support).    . celecoxib (CELEBREX) 200 MG capsule Take 1 capsule (200 mg total) by mouth as needed for mild pain or moderate pain. 30 capsule 1  . fluticasone (VERAMYST) 27.5 MCG/SPRAY nasal spray Place 2 sprays into the nose daily as needed for allergies.     Marland Kitchen glimepiride (AMARYL) 1 MG tablet TAKE 1 TABLET EVERY DAY WITH BREAKFAST 90 tablet 3  . metoprolol succinate (TOPROL-XL) 50 MG 24 hr tablet Take 1 tablet (50 mg total) by mouth daily. 90 tablet 3  . Multiple Vitamin (MULTIVITAMIN) tablet Take 1 tablet by mouth daily.    . pioglitazone (ACTOS) 45 MG tablet TAKE 1 TABLET EVERY DAY 90 tablet 3  . rivaroxaban (XARELTO) 20 MG TABS tablet Take 1 tablet (20 mg total) by mouth daily with supper. 90 tablet 3  . valsartan-hydrochlorothiazide (DIOVAN-HCT) 160-12.5 MG tablet TAKE 1 TABLET EVERY DAY 90 tablet 3   No current facility-administered medications on file prior to visit.     Allergies  Allergen Reactions  . Penicillins     Has patient had a PCN reaction causing immediate rash, facial/tongue/throat swelling, SOB or  lightheadedness with hypotension: Yes Has patient had a PCN reaction causing severe rash involving mucus membranes or skin necrosis: Yes Has patient had a PCN reaction that required hospitalization No Has patient had a PCN reaction occurring within the last 10 years: No If all of the above answers are "NO", then may proceed with Cephalosporin use.    . Sulfa Antibiotics     Rash/itching  . Azithromycin Palpitations    Past Medical History:  Diagnosis Date  . Generalized osteoarthritis of multiple sites   . GERD (gastroesophageal reflux disease)   . History of DVT (deep vein thrombosis) 2016   estrogen and airflights. Rx with xarelto for 3 months  . Hypertension   . Hyperthyroidism    treated briefly in college  . Insomnia   . Retinal tear of left eye   . Rosacea   . Type 2 diabetes, controlled, with peripheral neuropathy ([place])   . Uterine fibroid     Past Surgical History:  Procedure Laterality Date  . CARPAL TUNNEL RELEASE Right    Dr Burney Gauze  . CATARACT EXTRACTION    . RETINAL TEAR REPAIR CRYOTHERAPY     10/09/13, then again 3/15    Family History  Problem Relation Age of Onset  . Diabetes Father   . Pancreatic cancer Father   . Diabetes Other        grandmother    Social History  Social History  . Marital status: Married    Spouse name: N/A  . Number of children: 3  . Years of education: N/A   Occupational History  . 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 Smoking status: Former Smoker    Quit date: 09/26/1993  . Smokeless tobacco: Never Used     Comment: quit in 1996  . Alcohol use 0.6 oz/week    1 Standard drinks or equivalent per week     Comment: occasional wine  . Drug use: No  . Sexual activity: No     Comment: husband vasectomy   Other Topics Concern  . Not on file   Social History Narrative   Has living will   Husband is health care POA-- or daughter Izora Gala   Would accept resuscitation attempts    Would not want tube feeds if cognitively unaware   Review of Systems No fever No cold symptoms No stroke symptoms like focal weakness, facial droop, aphasia, dysphagia    Objective:   Physical Exam  Constitutional: She is oriented to person, place, and time. She appears well-nourished. No distress.  HENT:  Mouth/Throat: Oropharynx is clear and moist. No oropharyngeal exudate.  Eyes: Pupils are equal, round, and reactive to light. EOM are normal.  Neck: No thyromegaly present.  Cardiovascular: Normal rate, regular rhythm and normal heart sounds.  Exam reveals no gallop.   No murmur heard. Pulmonary/Chest: Effort normal and breath sounds normal. No respiratory distress. She has no wheezes. She has no rales.  Lymphadenopathy:    She has no cervical adenopathy.  Neurological: She is alert and oriented to person, place, and time. She has normal strength. She displays no tremor. No cranial nerve deficit. She exhibits normal muscle tone. She displays a negative Romberg sign. Coordination and gait normal.  Psychiatric: She has a normal mood and affect. Her behavior is normal.          Assessment & Plan:

## 2017-04-06 NOTE — Assessment & Plan Note (Signed)
This really seems vestibular No findings to suggest central etiology or stroke Chronic tinnitus and hearing loss--no sig change Would hold off on MRI  Discussed trying meclizine and weaning off when symptoms are gone

## 2017-04-10 ENCOUNTER — Encounter: Payer: Self-pay | Admitting: Internal Medicine

## 2017-04-29 ENCOUNTER — Encounter: Payer: Self-pay | Admitting: Internal Medicine

## 2017-04-29 DIAGNOSIS — R42 Dizziness and giddiness: Secondary | ICD-10-CM

## 2017-05-10 DIAGNOSIS — D496 Neoplasm of unspecified behavior of brain: Secondary | ICD-10-CM | POA: Diagnosis not present

## 2017-05-10 DIAGNOSIS — R270 Ataxia, unspecified: Secondary | ICD-10-CM | POA: Diagnosis not present

## 2017-05-10 DIAGNOSIS — R42 Dizziness and giddiness: Secondary | ICD-10-CM | POA: Diagnosis not present

## 2017-05-11 ENCOUNTER — Other Ambulatory Visit: Payer: Self-pay | Admitting: Acute Care

## 2017-05-11 DIAGNOSIS — R270 Ataxia, unspecified: Secondary | ICD-10-CM | POA: Insufficient documentation

## 2017-05-11 DIAGNOSIS — R42 Dizziness and giddiness: Secondary | ICD-10-CM

## 2017-05-15 ENCOUNTER — Ambulatory Visit: Payer: Medicare Other

## 2017-05-16 DIAGNOSIS — G9389 Other specified disorders of brain: Secondary | ICD-10-CM | POA: Diagnosis not present

## 2017-05-17 ENCOUNTER — Encounter: Payer: Self-pay | Admitting: Internal Medicine

## 2017-05-19 DIAGNOSIS — G9389 Other specified disorders of brain: Secondary | ICD-10-CM | POA: Diagnosis not present

## 2017-05-23 ENCOUNTER — Other Ambulatory Visit: Payer: Self-pay | Admitting: Neurosurgery

## 2017-05-23 ENCOUNTER — Encounter: Payer: Self-pay | Admitting: Internal Medicine

## 2017-05-23 DIAGNOSIS — G9389 Other specified disorders of brain: Secondary | ICD-10-CM

## 2017-05-23 DIAGNOSIS — R2689 Other abnormalities of gait and mobility: Secondary | ICD-10-CM | POA: Diagnosis not present

## 2017-05-23 DIAGNOSIS — G939 Disorder of brain, unspecified: Secondary | ICD-10-CM | POA: Diagnosis not present

## 2017-05-23 DIAGNOSIS — C7931 Secondary malignant neoplasm of brain: Secondary | ICD-10-CM | POA: Diagnosis not present

## 2017-05-24 ENCOUNTER — Ambulatory Visit: Payer: Medicare Other

## 2017-05-30 ENCOUNTER — Ambulatory Visit
Admission: RE | Admit: 2017-05-30 | Discharge: 2017-05-30 | Disposition: A | Discharge status: Home / Self Care | Payer: Medicare Other | Source: Ambulatory Visit | Attending: Neurosurgery | Admitting: Neurosurgery

## 2017-05-30 ENCOUNTER — Telehealth: Payer: Self-pay | Admitting: Cardiology

## 2017-05-30 DIAGNOSIS — N281 Cyst of kidney, acquired: Secondary | ICD-10-CM | POA: Diagnosis not present

## 2017-05-30 DIAGNOSIS — G939 Disorder of brain, unspecified: Secondary | ICD-10-CM | POA: Insufficient documentation

## 2017-05-30 DIAGNOSIS — G9389 Other specified disorders of brain: Secondary | ICD-10-CM

## 2017-05-30 DIAGNOSIS — C7931 Secondary malignant neoplasm of brain: Secondary | ICD-10-CM | POA: Insufficient documentation

## 2017-05-30 DIAGNOSIS — K429 Umbilical hernia without obstruction or gangrene: Secondary | ICD-10-CM | POA: Insufficient documentation

## 2017-05-30 DIAGNOSIS — J9811 Atelectasis: Secondary | ICD-10-CM | POA: Diagnosis not present

## 2017-05-30 MED ORDER — IOPAMIDOL (ISOVUE-300) INJECTION 61%
100.0000 mL | Freq: Once | INTRAVENOUS | Status: AC | PRN
  Administered 2017-05-30: 100 mL via INTRAVENOUS

## 2017-05-30 NOTE — Telephone Encounter (Signed)
Fine with me.  David Harding, MD  

## 2017-05-30 NOTE — Telephone Encounter (Signed)
New Message   Pt wants to switch to Dr. Rockey Situ from Dr. Ellyn Hack because they live in [place]. Would like approval from both providers.

## 2017-05-30 NOTE — Telephone Encounter (Signed)
Ok with me 

## 2017-05-31 DIAGNOSIS — R42 Dizziness and giddiness: Secondary | ICD-10-CM | POA: Diagnosis not present

## 2017-05-31 DIAGNOSIS — H903 Sensorineural hearing loss, bilateral: Secondary | ICD-10-CM | POA: Diagnosis not present

## 2017-06-01 NOTE — Telephone Encounter (Signed)
lmov to schedule appt  °

## 2017-06-07 ENCOUNTER — Encounter: Payer: Self-pay | Admitting: Internal Medicine

## 2017-06-08 DIAGNOSIS — R42 Dizziness and giddiness: Secondary | ICD-10-CM | POA: Diagnosis not present

## 2017-06-12 MED ORDER — LOSARTAN POTASSIUM-HCTZ 100-12.5 MG PO TABS: 1.0000 | ORAL_TABLET | Freq: Every day | ORAL | 3 refills | Status: DC

## 2017-06-12 MED ORDER — METOPROLOL SUCCINATE ER 50 MG PO TB24: 50.0000 mg | ORAL_TABLET | Freq: Every day | ORAL | 0 refills | Status: DC

## 2017-06-12 MED ORDER — RIVAROXABAN 20 MG PO TABS: 20.0000 mg | ORAL_TABLET | Freq: Every day | ORAL | 3 refills | Status: DC

## 2017-06-12 MED ORDER — PIOGLITAZONE HCL 45 MG PO TABS: 45.0000 mg | ORAL_TABLET | Freq: Every day | ORAL | 3 refills | Status: DC

## 2017-06-12 MED ORDER — GLIMEPIRIDE 1 MG PO TABS: ORAL_TABLET | ORAL | 3 refills | Status: DC

## 2017-06-12 NOTE — Telephone Encounter (Signed)
Rx sent as requested. Pt advised per Dr [name] via Deloris Ping

## 2017-06-12 NOTE — Telephone Encounter (Signed)
Please discontinue the valsartan HCTZ and substitute losartan/HCTZ 100/12.5. Please do all the other refills she requests as well She should probably have Dr Rockey Situ do a kidney profile blood test at her November appt due to the change in medication

## 2017-06-14 ENCOUNTER — Encounter: Payer: Self-pay | Admitting: Physical Therapy

## 2017-06-14 ENCOUNTER — Ambulatory Visit: Payer: Medicare Other | Attending: Neurosurgery | Admitting: Physical Therapy

## 2017-06-14 DIAGNOSIS — R262 Difficulty in walking, not elsewhere classified: Secondary | ICD-10-CM | POA: Diagnosis not present

## 2017-06-14 DIAGNOSIS — M6281 Muscle weakness (generalized): Secondary | ICD-10-CM

## 2017-06-14 DIAGNOSIS — R2681 Unsteadiness on feet: Secondary | ICD-10-CM | POA: Diagnosis not present

## 2017-06-14 NOTE — Patient Instructions (Signed)
   Feet Apart, Varied Arm Positions - Eyes Closed   Stand in corner (try to not lean on wall) with chair out in front of you for safety. Stand with feet shoulder width apart and arms by side. Close eyes and visualize upright position. Hold __10__ seconds. The open eyes and hold for 10 sec. Repeat alternating between eyes open/closed; As this gets easier, try with feet close together.  Repeat __5__ times per session. Do _2-3___ sessions per day.  Copyright  VHI. All rights reserved.  Feet Apart, Head Motion - Eyes Closed  Stand in corner (try to not lean on wall) with chair out in front of you for safety. With eyes closed and feet shoulder width apart, move head slowly, up and down 5 times, then open eyes and try to find your balance. Then try with eyes closed and move head side/side x5 times. Repeat _2___ times each direction per session. Do __2__ sessions per day.  Copyright  VHI. All rights reserved.  Feet Apart, Arm Motion - Eyes Closed  Stand in corner (try to not lean on wall) with chair out in front of you for safety. With eyes closed and feet shoulder width apart, move arms up and down: to front. Try to keep feet still and keep a good posture as you move your arms.  Repeat __10__ times per session. Do __2__ sessions per day.  Copyright  VHI. All rights reserved.  Feet Heel-Toe "Tandem", Varied Arm Positions - Eyes Closed  Stand in corner (try to not lean on wall) with chair out in front of you for safety. Stand with right foot directly in front of the other and arms out. Close eyes and visualize upright position. Hold_5-10__ seconds. Repeat _2-3___ times per session. Do __2__ sessions per day.  Copyright  VHI. All rights reserved.    To increase difficulty of any exercise, try standing on couch cushion or pillow for uneven surface.  Make sure that you practice good safety with having objects nearby to grab ahold of if you start to loose your balance.  If you start to  get dizzy, please stop and take a rest break.  

## 2017-06-14 NOTE — Therapy (Addendum)
[place] MAIN [facility] SERVICES [address] [place], [place], [address] Phone: [phone]   Fax:  [phone]  Physical Therapy Evaluation  Patient Details  Name: Angela Bentley MRN: [id number] Date of Birth: [birth year] Referring Provider: Dr. Lacinda Axon  Encounter Date: 06/14/2017      PT End of Session - 06/14/17 2005    Visit Number 1   Number of Visits 13   Date for PT Re-Evaluation 07/26/17   Authorization Type G code 1   Authorization Time Period 10   PT Start Time 0848   PT Stop Time 0946   PT Time Calculation (min) 58 min   Equipment Utilized During Treatment Gait belt   Activity Tolerance Patient tolerated treatment well   Behavior During Therapy [facility] for tasks assessed/performed      Past Medical History:  Diagnosis Date  . Generalized osteoarthritis of multiple sites   . GERD (gastroesophageal reflux disease)   . History of DVT (deep vein thrombosis) 2016   estrogen and airflights. Rx with xarelto for 3 months  . Hypertension   . Hyperthyroidism    treated briefly in college  . Insomnia   . Retinal tear of left eye   . Rosacea   . Type 2 diabetes, controlled, with peripheral neuropathy (HCC)    Last A1C was a near 7.1  . Uterine fibroid     Past Surgical History:  Procedure Laterality Date  . CARPAL TUNNEL RELEASE Right    Dr Burney Gauze  . CATARACT EXTRACTION    . RETINAL TEAR REPAIR CRYOTHERAPY     10/09/13, then again 3/15    There were no vitals filed for this visit.       Subjective Assessment - 06/14/17 0849    Subjective Pt feels that she is still having balance issues without nausea or spinning that is worse with dynamic balance. Pt's tinnitus has worsened recently. Pt feels she is accommodating to her deficits, though her gait is still not normal; reports "I walk like a duck   Pertinent History 76 y/o female with self-reported decrease in balance over the last 2 months following sudden onset of dizziness  and ataxia on 04/01/17. Pt was [facility] before follow-up with neurology. Pt has had a recent dx of Left ventricular brain tumor. Per office visit with neurologist on 8/28, "I cannot relate balance deficits completely to this mass although there is a possibility it is contributing". Recent CT scan showed no evidence of primarily malignancy elsewhere. Currently tumor is being monitored with follow-up MRI planned for 2-3 months from 05/23/17. Pt's current balance deficits are worse with first steps following static standing or sitting, though pt reports no imbalance on initail standing. Pt has medical Hx significant for a-fib, DM2, and HTN. No Hx of cancer. Stress test on 12/15/16 indicated that pt was hypertensive before during and after stress test. Blood pressure response was appropriate with exaggerated heart rate response to exercise noted. Pt feels nauseated with water aerobics. Pt reports low visual acuity on R side that has not changed since birth. Pt reports she has ocular migraines that intermittently cause her to see auras on the L   Limitations Walking;House hold activities   How long can you sit comfortably? Arthritis related pain with prolonged sitting.   How long can you stand comfortably? Pt reports no balance issues in static stance   How long can you walk comfortably? Self reported balance decifits walking that do  not get worse with time   Diagnostic tests MRI brain with and without contrast: There is a 2.5-3 cm enhancing mass situated within the atrium of the left ventricle. There is enlargement of the entire ventricular system out of proportion to the location of this mass. There is global atrophy noted within the brain. There are no other enhancing masses seen.    Patient Stated Goals To improve balance   Currently in Pain? No/denies            [facility] PT Assessment - 06/15/17 0001      Assessment   Medical Diagnosis Balance deficits    Referring Provider Dr. Lacinda Axon   Onset Date/Surgical Date 04/02/17   Hand Dominance Right   Next MD Visit 07/05/17   Prior Therapy None     Precautions   Precautions Fall   Precaution Comments Pt reports feeling unsteady with dynamic balance     Restrictions   Weight Bearing Restrictions No     Balance Screen   Has the patient fallen in the past 6 months No   Has the patient had a decrease in activity level because of a fear of falling?  No   Is the patient reluctant to leave their home because of a fear of falling?  No     Home Environment   Living Environment Private residence   Living Arrangements Spouse/significant other   Type of [place] Access Level entry   [place] - single point  Pt does not currently use cane.     Prior Function   Level of Independence Independent   Vocation Retired   [employer] None, previously in [employer]; pt unable to perform due to visual/balance disturbance with the water. Pt with reduced activity level with dog walking.     Functional Gait  Assessment   Gait assessed  Yes   Gait Level Surface Walks 20 ft in less than 5.5 sec, no assistive devices, good speed, no evidence for imbalance, normal gait pattern, deviates no more than 6 in outside of the 12 in walkway width.   Change in Gait Speed Able to smoothly change walking speed without loss of balance or gait deviation. Deviate no more than 6 in outside of the 12 in walkway width.   Gait with Horizontal Head Turns Performs head turns smoothly with slight change in gait velocity (eg, minor disruption to smooth gait path), deviates 6-10 in outside 12 in walkway width, or uses an assistive device.   Gait with Vertical Head Turns Performs task with slight change in gait velocity (eg, minor disruption to smooth gait path), deviates 6 - 10 in outside 12 in walkway width or uses assistive device   Gait and Pivot Turn Pivot  turns safely within 3 sec and stops quickly with no loss of balance.   Step Over Obstacle Is able to step over one shoe box (4.5 in total height) without changing gait speed. No evidence of imbalance.   Gait with Narrow Base of Support Ambulates 7-9 steps.   Gait with Eyes Closed Walks 20 ft, uses assistive device, slower speed, mild gait deviations, deviates 6-10 in outside 12 in walkway width. Ambulates 20 ft in less than 9 sec but greater than 7 sec.   Ambulating Backwards Walks 20 ft, uses assistive device, slower speed, mild gait deviations, deviates 6-10 in outside 12 in walkway width.   Steps Alternating feet,  no rail.   Total Score 24         PROM/AROM:  Upper Extremity: WFL  Lower extremity: WFL; full knee flexion extension, DF past neutral, hip motion grossly assessed with gait and transfers with no obvious deficit.   STRENGTH:  Graded on a 0-5 scale Muscle Group Left Right  Gross Upper Extremity [facility]  Hip Flex 4+ 4+  Hip Abd 4 4  Hip Add 4+ 4+  Knee Flex 5 5  Knee Ext 4+ 4+  Ankle DF 4 5   Sensory:  Light touch: Intact 5/5 locations in feet bilaterally with monofilament;    Proprioception: Pt is able to maintain balance with head turns and eyes closed in static stance. Will further assess on next visit.     Coordination:   Finger to nose: WFL, no past-pointing  Heel to knee: Slightly decreased speed and accuracy on LLE, but WFL   Reflexes:       Patellar: 2+ bilaterally.   Achilles: 2 bilaterally  Observations:   Posture: No gross abnormalities; slight forward shoulders and forward head, but WFL.   BALANCE: Increased sway in Romberg and sharpened Romberg, LOB with release of moderate perturbation. LOB in tandem eyes closed within 5 sec   Static Standing Balance  Normal Able to maintain standing balance against maximal resistance   Good Able to maintain standing balance against moderate resistance   Good-/Fair+ Able to maintain standing balance  against minimal resistance X  Fair Able to stand unsupported without UE support and without LOB for 1-2 min   Fair- Requires Min A and UE support to maintain standing without loss of balance   Poor+ Requires mod A and UE support to maintain standing without loss of balance   Poor Requires max A and UE support to maintain standing balance without loss     Standing Dynamic Balance  Normal Stand independently unsupported, able to weight shift and cross midline maximally   Good Stand independently unsupported, able to weight shift and cross midline moderately   Good-/Fair+ Stand independently unsupported, able to weight shift across midline minimally X  Fair Stand independently unsupported, weight shift, and reach ipsilaterally, loss of balance when crossing midline   Poor+ Able to stand with Min A and reach ipsilaterally, unable to weight shift   Poor+ Able to stand with Mod A and minimally reach ipsilaterally, unable to cross midline.     FUNCTIONAL MOBILITY:   Transfers: Pt is able to stand independently without use of UEs and no evidence of imbalance upon initial standing. Pt takes slightly increased time for stand without UE assist.   Gait: Pt walks with abducted gait and slight toed out, likely as a compensation in increase BOS due to feelings of instability. Pt's gait pattern is reciprocal. Pt has decreased stride length and slower cadence with head turns. Excessively decreased gait speed with backwards walking and walking eyes closed. Pt demonstrated imbalance with compensatory stepping on first steps after initial standing.    OUTCOME MEASURES: TEST Outcome Interpretation  5 times sit<>stand        15.18sec with no UE support >12 sec discriminates faller from non-fallers in older adults   10 meter walk test           1.53m/s with no AD and SBA Pt is capable of community ambulation distances  FGA            24/30 Pt is a moderate fall risk.  ABC  67.5 /100 67.5% confidence  with balance (32.5% disability)              Objective measurements completed on examination: See above findings.       Pt instructed in sensory adaptation training (see patient instructions for details) with progression in stance while standing in a corner with a sturdy chair in front for safety. Pt instructed to challenge balance with head turns and UE reaching to challenge dynamic balance. Each stance x 5 reps for dynamic balance postures, 2 sets, and 2 x per day. Pt cued to keep hands near the wall for safety and to only perform eyes closed activities with close supervision.            PT Education - 06/14/17 2005    Education provided Yes   Education Details Plan of care, balance exercises, HEP   Person(s) Educated Patient   Methods Explanation;Demonstration;Tactile cues;Verbal cues;Handout   Comprehension Verbal cues required;Returned demonstration;Verbalized understanding;Tactile cues required          PT Short Term Goals - 06/14/17 2023      PT SHORT TERM GOAL #1   Title  Patient will continue to deny any falls over past 4 weeks to demonstrate successful compensatory strategies and continued safety awareness at home and in community.    Baseline Pt is fall rsik   Time 4   Period Weeks   Status New   Target Date 07/12/17     PT SHORT TERM GOAL #2   Title Pt will perform HEP at least 3/7 days/wk in order to show meaningful improvement in strength and balance for improved functional mobility and safety.   Time 4   Period Weeks   Status New   Target Date 07/12/17           PT Long Term Goals - 06/14/17 2025      PT LONG TERM GOAL #1   Title Patient will be independent in home exercise program to improve strength/mobility for better functional independence with ADLs.   Time 6   Period Weeks   Status New   Target Date 07/26/17     PT LONG TERM GOAL #2   Title Patient will complete five times sit to stand test in < 12 seconds indicating an  increased LE strength and improved balance in older adult.   Baseline 15.15 sec with no UE support (>12 sec discriminates faller from non-fallers in older adults)   Time 6   Period Weeks   Status New   Target Date 07/26/17     PT LONG TERM GOAL #3   Title Patient will increase Functional Gait Assessment score to >28/30 as to reduce fall risk and improve dynamic gait safety with community ambulation.   Baseline 24/30 indicating pt is a moderate fall risk.   Time 6   Period Weeks   Status New   Target Date 07/26/17     PT LONG TERM GOAL #4   Title Patient will increase ABC scale score >80% to demonstrate better functional mobility and better confidence with ADLs.    Baseline 67.5% confidence with balance (32.5% disability)   Time 6   Period Weeks   Status New   Target Date 07/26/17     PT LONG TERM GOAL #5   Title Patient will increase BLE gross strength to 4+/5 as to improve functional strength for independent gait, increased standing tolerance and increased ADL ability.   Time 6   Period Weeks  Status New   Target Date 07/26/17     PT LONG TERM GOAL #6   Title Pt will ambulate with normal stride width to demonstrate improved gait mechanics with less compensation of balance,  for more efficient and functional mobility   Time 6   Period Weeks   Status New   Target Date 07/26/17                Plan - 06/17/2017 2006    Clinical Impression Statement 76 y/o female presents to therapy with reports of balance deficits that began suddenly on 04/01/17. Recent neurology consult and MRI reveal that pt has a left ventricular brain tumor, though tumor has not been indicated as the cause of pt's balance deficits. Pt assessed with findings of mild general weakness bilaterally and balance deficits. 5 x sit<>stand, Activity Specific Balance Scale self-report measure, and Functional gait assessment all indicate that pt is at an increased fall risk. Pt has no Hx of falls. Pt's gait was  noted to be abnormal with widened BOS. Currently pt has had reduced activity level with such activities as walking her dogs. Pt is appropriate for skilled therapy at this time to address pt's balance and gait deficits and to maximize functional mobility and safety.   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   PT Frequency 2x / week   PT Duration 6 weeks   PT Treatment/Interventions ADLs/Self Care Home Management;Biofeedback;Gait training;Stair training;Balance training;Therapeutic exercise;Therapeutic activities;Functional mobility training;Neuromuscular re-education;Patient/family education;Energy conservation;Visual/perceptual remediation/compensation   PT Next Visit Plan Further assess balance deficits including proprioception, screen cranial nerves, advance HEP with ther-ex for general LE strengthening    PT Home Exercise Plan Static and dynamic balance   Consulted and Agree with Plan of Care Patient      Patient will benefit from skilled therapeutic intervention in order to improve the following deficits and impairments:  Abnormal gait, Decreased activity tolerance, Decreased balance, Decreased endurance, Decreased mobility, Decreased strength, Dizziness, Difficulty walking, Impaired perceived functional ability, Improper body mechanics, Impaired vision/preception  Visit Diagnosis: Unsteadiness on feet - Plan: PT plan of care cert/re-cert  Muscle weakness (generalized) - Plan: PT plan of care cert/re-cert  Difficulty in walking, not elsewhere classified - Plan: PT plan of care cert/re-cert       G-Codes - Jun 17, 2017 1700    Functional Assessment Tool Used (Outpatient Only) FGA, ABC, 5 times sit<>stand, 10 meter walk, clinical judgement;    Functional Limitation Mobility: Walking and moving around   Mobility: Walking and Moving Around Current Status [phone]) At least 20 percent but less than 40 percent impaired, limited or restricted   Mobility: Walking and Moving Around Goal Status [phone]) At least 1 percent but less than 20 percent impaired, limited or restricted       Problem List Patient Active Problem List   Diagnosis Date Noted  . Dizziness 04/06/2017  . PAF (paroxysmal atrial fibrillation) ([place]) 02/03/2017  . DOE (dyspnea on exertion) 12/01/2016  . Palpitations 11/03/2016  . Acute right-sided low back pain without sciatica 08/26/2016  . Preventative health care 06/02/2016  . Advance directive discussed with patient 06/02/2016  .  Achilles tendonitis 03/11/2016  . Hypertension   . History of DVT (deep vein thrombosis)   . Type 2 diabetes, controlled, with peripheral neuropathy ([place])   . Generalized osteoarthritis of multiple sites   . GERD (gastroesophageal reflux disease)   . Hyperthyroidism    Burnis Halling M Sarahann Horrell, SPT This entire session was performed under direct supervision and direction of a licensed Chiropractor . I have personally read, edited and approve of the note as written.  Trotter,Margaret PT, DPT 06/15/2017, 2:53 PM  [place] MAIN [facility] SERVICES [address] [place], [place], [address] Phone: [phone]   Fax:  [phone]  Name: Angela Bentley MRN: [id number] Date of Birth: August 24, 1941

## 2017-06-15 DIAGNOSIS — R42 Dizziness and giddiness: Secondary | ICD-10-CM | POA: Diagnosis not present

## 2017-06-20 ENCOUNTER — Ambulatory Visit: Payer: Medicare Other | Admitting: Physical Therapy

## 2017-06-20 ENCOUNTER — Encounter: Payer: Self-pay | Admitting: Physical Therapy

## 2017-06-20 DIAGNOSIS — M6281 Muscle weakness (generalized): Secondary | ICD-10-CM

## 2017-06-20 DIAGNOSIS — R262 Difficulty in walking, not elsewhere classified: Secondary | ICD-10-CM

## 2017-06-20 DIAGNOSIS — R2681 Unsteadiness on feet: Secondary | ICD-10-CM | POA: Diagnosis not present

## 2017-06-20 NOTE — Therapy (Signed)
[place] MAIN [facility] SERVICES [address] [place], [place], [address] Phone: [phone]   Fax:  [phone]  Physical Therapy Treatment  Patient Details  Name: Angela Bentley MRN: [id number] Date of Birth: 1940-10-24 Referring Provider: Dr. Lacinda Axon  Encounter Date: 06/20/2017      PT End of Session - 06/20/17 0812    Visit Number 2   Number of Visits 13   Date for PT Re-Evaluation 07/26/17   Authorization Type G code 2   Authorization Time Period 10   PT Start Time 0805   PT Stop Time 0845   PT Time Calculation (min) 40 min   Equipment Utilized During Treatment Gait belt   Activity Tolerance Patient tolerated treatment well   Behavior During Therapy [facility] for tasks assessed/performed      Past Medical History:  Diagnosis Date  . Generalized osteoarthritis of multiple sites   . GERD (gastroesophageal reflux disease)   . History of DVT (deep vein thrombosis) 2016   estrogen and airflights. Rx with xarelto for 3 months  . Hypertension   . Hyperthyroidism    treated briefly in college  . Insomnia   . Retinal tear of left eye   . Rosacea   . Type 2 diabetes, controlled, with peripheral neuropathy (HCC)    Last A1C was a near 7.1  . Uterine fibroid     Past Surgical History:  Procedure Laterality Date  . CARPAL TUNNEL RELEASE Right    Dr Burney Gauze  . CATARACT EXTRACTION    . RETINAL TEAR REPAIR CRYOTHERAPY     10/09/13, then again 3/15    There were no vitals filed for this visit.      Subjective Assessment - 06/20/17 0811    Subjective Patient reports doing okay this morning. She reports getting a cold over the weekend; denies any pain; reports that she walked the dog this morning. She is still having to turn feet out to keep balance. She does not feel that her dog pulls her off balance, but rather its just the walking.    Pertinent History 76 y/o female with self-reported decrease in balance over the last 2 months following  sudden onset of dizziness and ataxia on 04/01/17. Pt was initially diagnosed with dehydration at a minute clinic before follow-up with neurology. Pt has had a recent dx of Left ventricular brain tumor. Per office visit with neurologist on 8/28, "I cannot relate balance deficits completely to this mass although there is a possibility it is contributing". Recent CT scan showed no evidence of primarily malignancy elsewhere. Currently tumor is being monitored with follow-up MRI planned for 2-3 months from 05/23/17. Pt's current balance deficits are worse with first steps fllowing static standing or sitting, though pt reports no imbalance on initail standing. Pt has medical Hx significant for a-fib, DM2, and HTN. No Hx of cancer. Stress test on 12/15/16 indicated that pt was hypertensive before during and after stress test. Blood pressure response was appropriate with exaggerated heart rate response to exercise noted. Pt feels nauseated with water aerobics. Pt reports low visual acuity on R side that has not changed since birth. Pt reports she has ocular migraines that intermittently cause her to see auras on the L   Limitations Walking;House hold activities   How long can you sit comfortably? Arthritis related pain with prolonged sitting.   How long can you stand comfortably? Pt reports no balance issues in static stance   How long can you walk  comfortably? Self reported balance decifits walking that do not get worse with time   Diagnostic tests MRI brain with and without contrast: There is a 2.5-3 cm enhancing mass situated within the atrium of the left ventricle. There is enlargement of the entire ventricular system out of proportion to the location of this mass. There is global atrophy noted within the brain. There are no other enhancing masses seen.    Patient Stated Goals To improve balance   Currently in Pain? No/denies        TREATMENT: Warm up on Nustep BUE/BLE level 2 x4 min (Unbilled);  PT assessed  patient's static balance, firm eyes open/closed, foam eyes open/closed with feet together, 30 sec each; patient demonstrates: increased unsteadiness when on foam with eyes closed indicating impaired vestibular function;   Standing on airex x2: Heel/toe raises x15 without rail assist with cues to utilize bars as needed for balance;  Alternate march x10 bilaterally with cues to slow down march to facilitate SLS for short time and facilitate better weight shift;  Feet together: BUE ball pass side/side x5 reps with min A for safety, demonstrates unsteadiness with turning and reports increased dizziness Head turns side/side x5 reps with cues for visual gaze stabilization to reduce unsteadiness BUE ball up/down raise x5 reps with CGA for safety, demonstrates no unsteadiness; Patient required min VCs for balance stability, including to increase trunk control for less loss of balance with smaller base of support  Airex beam: Side stepping without rail assist x3 laps each direction Tandem gait x3 laps with 1 rail assist, CGA and cues to keep narrow base of support for better balance control;  Standing feet apart, balloon taps x4 min unsupported with close supervision, demonstrating balance recovery when reaching outside base of support, requiring rail intermittently; Tandem stance, 10 sec hold, unsupported x2 each foot in front; Patient requires cues to improve upper trunk control and weight shift for better balance control;  Patient re instructed in HEP with instruction to stay on firm surface for now and to keep feet close together with all exercise for balance challenge;                         PT Education - 06/20/17 0812    Education provided Yes   Education Details balance, HEP reinforced;    Person(s) Educated Patient   Methods Explanation;Demonstration;Verbal cues   Comprehension Verbalized understanding;Returned demonstration;Verbal cues required;Need further instruction           PT Short Term Goals - 06/14/17 2023      PT SHORT TERM GOAL #1   Title  Patient will continue to deny any falls over past 4 weeks to demonstrate successful compensatory strategies and continued safety awareness at home and in community.    Baseline Pt is fall rsik   Time 4   Period Weeks   Status New   Target Date 07/12/17     PT SHORT TERM GOAL #2   Title Pt will perform HEP at least 3/7 days/wk in order to show meaningful improvement in strength and balance for improved functional mobility and safety.   Time 4   Period Weeks   Status New   Target Date 07/12/17           PT Long Term Goals - 06/14/17 2025      PT LONG TERM GOAL #1   Title Patient will be independent in home exercise program to improve strength/mobility for better  functional independence with ADLs.   Time 6   Period Weeks   Status New   Target Date 07/26/17     PT LONG TERM GOAL #2   Title Patient will complete five times sit to stand test in < 12 seconds indicating an increased LE strength and improved balance in older adult.   Baseline 15.15 sec with no UE support (>12 sec discriminates faller from non-fallers in older adults)   Time 6   Period Weeks   Status New   Target Date 07/26/17     PT LONG TERM GOAL #3   Title Patient will increase Functional Gait Assessment score to >28/30 as to reduce fall risk and improve dynamic gait safety with community ambulation.   Baseline 24/30 indicating pt is a moderate fall risk.   Time 6   Period Weeks   Status New   Target Date 07/26/17     PT LONG TERM GOAL #4   Title Patient will increase ABC scale score >80% to demonstrate better functional mobility and better confidence with ADLs.    Baseline 67.5% confidence with balance (32.5% disability)   Time 6   Period Weeks   Status New   Target Date 07/26/17     PT LONG TERM GOAL #5   Title Patient will increase BLE gross strength to 4+/5 as to improve functional strength for independent gait,  increased standing tolerance and increased ADL ability.   Time 6   Period Weeks   Status New   Target Date 07/26/17     PT LONG TERM GOAL #6   Title Pt will ambulate with normal stride width to demonstrate improved gait mechanics with less compensation of balance,  for more efficient and functional mobility   Time 6   Period Weeks   Status New   Target Date 07/26/17               Plan - 06/20/17 0913    Clinical Impression Statement Patient instructed in advanced balance exercises. She exhibits impaired vestibular function with loss of balance when standing on foam surface with eyes closed. She also exhibits increased dizziness with trunk rotation reaching outside base of support. Patient required cues to improve weight shift and trunk control for better balance. 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   PT Frequency 2x / week   PT Duration 6 weeks   PT Treatment/Interventions ADLs/Self Care Home Management;Biofeedback;Gait training;Stair training;Balance training;Therapeutic exercise;Therapeutic activities;Functional mobility training;Neuromuscular re-education;Patient/family education;Energy conservation;Visual/perceptual remediation/compensation   PT Next Visit Plan Further assess balance deficits including proprioception, screen cranial nerves, advance HEP with ther-ex for general LE strengthening    PT Home Exercise Plan Static and dynamic balance   Consulted and Agree with Plan of Care Patient      Patient will benefit from skilled therapeutic intervention in order to improve the following deficits and impairments:  Abnormal gait, Decreased activity tolerance, Decreased balance, Decreased endurance, Decreased mobility, Decreased strength, Dizziness, Difficulty walking, Impaired perceived functional ability, Improper  body mechanics, Impaired vision/preception  Visit Diagnosis: Unsteadiness on feet  Muscle weakness (generalized)  Difficulty in walking, not elsewhere classified     Problem List Patient Active Problem List   Diagnosis Date Noted  . Dizziness 04/06/2017  . PAF (paroxysmal atrial fibrillation) ([place]) 02/03/2017  . DOE (dyspnea on exertion) 12/01/2016  . Palpitations  11/03/2016  . Acute right-sided low back pain without sciatica 08/26/2016  . Preventative health care 06/02/2016  . Advance directive discussed with patient 06/02/2016  . Achilles tendonitis 03/11/2016  . Hypertension   . History of DVT (deep vein thrombosis)   . Type 2 diabetes, controlled, with peripheral neuropathy ([place])   . Generalized osteoarthritis of multiple sites   . GERD (gastroesophageal reflux disease)   . Hyperthyroidism     Trotter,Margaret PT, DPT 06/20/2017, 9:23 AM  [place] MAIN [facility] SERVICES [address] [place], [place], [address] Phone: [phone]   Fax:  [phone]  Name: Mekiyah Gladwell MRN: [id number] Date of Birth: 08/31/1941

## 2017-06-22 ENCOUNTER — Ambulatory Visit: Payer: Medicare Other | Admitting: Physical Therapy

## 2017-06-22 ENCOUNTER — Encounter: Payer: Self-pay | Admitting: Physical Therapy

## 2017-06-22 DIAGNOSIS — R262 Difficulty in walking, not elsewhere classified: Secondary | ICD-10-CM | POA: Diagnosis not present

## 2017-06-22 DIAGNOSIS — M6281 Muscle weakness (generalized): Secondary | ICD-10-CM

## 2017-06-22 DIAGNOSIS — R2681 Unsteadiness on feet: Secondary | ICD-10-CM | POA: Diagnosis not present

## 2017-06-22 NOTE — Therapy (Signed)
[place] MAIN [facility] SERVICES [address] [place], [place], [address] Phone: [phone]   Fax:  [phone]  Physical Therapy Treatment  Patient Details  Name: Angela Bentley MRN: [id number] Date of Birth: Aug 03, 1941 Referring Provider: Dr. Lacinda Axon  Encounter Date: 06/22/2017      PT End of Session - 06/22/17 0942    Visit Number 3   Number of Visits 13   Date for PT Re-Evaluation 07/26/17   Authorization Type G code 3   Authorization Time Period 10   PT Start Time 0934   PT Stop Time 1015   PT Time Calculation (min) 41 min   Equipment Utilized During Treatment Gait belt   Activity Tolerance Patient tolerated treatment well   Behavior During Therapy [facility] for tasks assessed/performed      Past Medical History:  Diagnosis Date  . Generalized osteoarthritis of multiple sites   . GERD (gastroesophageal reflux disease)   . History of DVT (deep vein thrombosis) 2016   estrogen and airflights. Rx with xarelto for 3 months  . Hypertension   . Hyperthyroidism    treated briefly in college  . Insomnia   . Retinal tear of left eye   . Rosacea   . Type 2 diabetes, controlled, with peripheral neuropathy (HCC)    Last A1C was a near 7.1  . Uterine fibroid     Past Surgical History:  Procedure Laterality Date  . CARPAL TUNNEL RELEASE Right    Dr Burney Gauze  . CATARACT EXTRACTION    . RETINAL TEAR REPAIR CRYOTHERAPY     10/09/13, then again 3/15    There were no vitals filed for this visit.      Subjective Assessment - 06/22/17 0941    Subjective Patient reports that her cold is still pretty bad. She reports having increased sore throat. She thinks that she had a small fever yesterday being really hot. She denies any recent falls but reports that her balance has been off since feeling more sick.    Pertinent History 76 y/o female with self-reported decrease in balance over the last 2 months following sudden onset of dizziness and ataxia  on 04/01/17. Pt was initially diagnosed with dehydration at a minute clinic before follow-up with neurology. Pt has had a recent dx of Left ventricular brain tumor. Per office visit with neurologist on 8/28, "I cannot relate balance deficits completely to this mass although there is a possibility it is contributing". Recent CT scan showed no evidence of primarily malignancy elsewhere. Currently tumor is being monitored with follow-up MRI planned for 2-3 months from 05/23/17. Pt's current balance deficits are worse with first steps fllowing static standing or sitting, though pt reports no imbalance on initail standing. Pt has medical Hx significant for a-fib, DM2, and HTN. No Hx of cancer. Stress test on 12/15/16 indicated that pt was hypertensive before during and after stress test. Blood pressure response was appropriate with exaggerated heart rate response to exercise noted. Pt feels nauseated with water aerobics. Pt reports low visual acuity on R side that has not changed since birth. Pt reports she has ocular migraines that intermittently cause her to see auras on the L   Limitations Walking;House hold activities   How long can you sit comfortably? Arthritis related pain with prolonged sitting.   How long can you stand comfortably? Pt reports no balance issues in static stance   How long can you walk comfortably? Self reported balance decifits walking that do not  get worse with time   Diagnostic tests MRI brain with and without contrast: There is a 2.5-3 cm enhancing mass situated within the atrium of the left ventricle. There is enlargement of the entire ventricular system out of proportion to the location of this mass. There is global atrophy noted within the brain. There are no other enhancing masses seen.    Patient Stated Goals To improve balance   Currently in Pain? No/denies         TREATMENT: Warm up on Nustep BUE/BLE level 2 x4 min (Unbilled);   Standing on airex x2: Feet together, eyes  open/closed 10 sec hold x3 reps each with min A, patient able to progress with less wobble and better stance control with eyes closed with increased repetitions;  Heel/toe raises x15 without rail assist with cues to utilize bars as needed for balance;  Feet together:BUE ball pass side/side x5 reps with min A for safety, demonstrates unsteadiness with turning and reports increased dizziness; requires cues to slow down UE movement for better balance control;  Modified tandem stance, Head turns side/side x5 reps with cues for visual gaze stabilization to reduce unsteadiness Modified tandem stance, BUE ball up/down raise x5 reps with CGA for safety, demonstrates no unsteadiness; Patient required min VCs for balance stability, including to increase trunk control for less loss of balance with smaller base of support   Standing on 1/2 bolster (flat side up):  -Heel/toe raises with rail assist to facilitate better ankle strategies and forward/bakcward weight shift x15 reps each; -Tandem stance with 1-0 rail assist, 10 sec hold x3 each foot in front;min Vcs to increase core abdominal stabilization for increased balance control;  -Feet apart, mini squat unsupported, x10 reps with min A for safety; Patient requires cues to avoid excessive backward lean during squat; -Feet apart, BUE wand flexion x10 reps with min A for safety and cues to improve ankle strategies for better stance control;   Forward lunges on BOSU x10 bilaterally with rail assist for safety;  Patient reports slight fatigue at end of session; Reinforced HEP;                    PT Education - 06/22/17 0942    Education provided Yes   Education Details balance, HEP reinforced;    Person(s) Educated Patient   Methods Explanation;Demonstration;Verbal cues   Comprehension Verbalized understanding;Returned demonstration;Verbal cues required;Need further instruction          PT Short Term Goals - 06/14/17 2023      PT  SHORT TERM GOAL #1   Title  Patient will continue to deny any falls over past 4 weeks to demonstrate successful compensatory strategies and continued safety awareness at home and in community.    Baseline Pt is fall rsik   Time 4   Period Weeks   Status New   Target Date 07/12/17     PT SHORT TERM GOAL #2   Title Pt will perform HEP at least 3/7 days/wk in order to show meaningful improvement in strength and balance for improved functional mobility and safety.   Time 4   Period Weeks   Status New   Target Date 07/12/17           PT Long Term Goals - 06/14/17 2025      PT LONG TERM GOAL #1   Title Patient will be independent in home exercise program to improve strength/mobility for better functional independence with ADLs.   Time 6  Period Weeks   Status New   Target Date 07/26/17     PT LONG TERM GOAL #2   Title Patient will complete five times sit to stand test in < 12 seconds indicating an increased LE strength and improved balance in older adult.   Baseline 15.15 sec with no UE support (>12 sec discriminates faller from non-fallers in older adults)   Time 6   Period Weeks   Status New   Target Date 07/26/17     PT LONG TERM GOAL #3   Title Patient will increase Functional Gait Assessment score to >28/30 as to reduce fall risk and improve dynamic gait safety with community ambulation.   Baseline 24/30 indicating pt is a moderate fall risk.   Time 6   Period Weeks   Status New   Target Date 07/26/17     PT LONG TERM GOAL #4   Title Patient will increase ABC scale score >80% to demonstrate better functional mobility and better confidence with ADLs.    Baseline 67.5% confidence with balance (32.5% disability)   Time 6   Period Weeks   Status New   Target Date 07/26/17     PT LONG TERM GOAL #5   Title Patient will increase BLE gross strength to 4+/5 as to improve functional strength for independent gait, increased standing tolerance and increased ADL ability.    Time 6   Period Weeks   Status New   Target Date 07/26/17     PT LONG TERM GOAL #6   Title Pt will ambulate with normal stride width to demonstrate improved gait mechanics with less compensation of balance,  for more efficient and functional mobility   Time 6   Period Weeks   Status New   Target Date 07/26/17               Plan - 06/22/17 1111    Clinical Impression Statement Patient instructed in advanced balance exercise. She requires cues for better upper trunk control and to slow down UE movement for better balance control. She demonstrates better static standing with less unsteadines with eyes closed on uneven surface, especially with increased repetition. 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   PT Frequency 2x / week   PT Duration 6 weeks   PT Treatment/Interventions ADLs/Self Care Home Management;Biofeedback;Gait training;Stair training;Balance training;Therapeutic exercise;Therapeutic activities;Functional mobility training;Neuromuscular re-education;Patient/family education;Energy conservation;Visual/perceptual remediation/compensation   PT Home Exercise Plan Static and dynamic balance   Consulted and Agree with Plan of Care Patient      Patient will benefit from skilled therapeutic intervention in order to improve the following deficits and impairments:  Abnormal gait, Decreased activity tolerance, Decreased balance, Decreased endurance, Decreased mobility, Decreased strength, Dizziness, Difficulty walking, Impaired perceived functional ability, Improper body mechanics, Impaired vision/preception  Visit Diagnosis: Unsteadiness on feet  Muscle weakness (generalized)  Difficulty in walking, not elsewhere classified     Problem List Patient Active Problem List   Diagnosis Date Noted  .  Dizziness 04/06/2017  . PAF (paroxysmal atrial fibrillation) ([place]) 02/03/2017  . DOE (dyspnea on exertion) 12/01/2016  . Palpitations 11/03/2016  . Acute right-sided low back pain without sciatica 08/26/2016  . Preventative health care 06/02/2016  . Advance directive discussed with patient 06/02/2016  . Achilles tendonitis 03/11/2016  . Hypertension   . History of DVT (deep  vein thrombosis)   . Type 2 diabetes, controlled, with peripheral neuropathy ([place])   . Generalized osteoarthritis of multiple sites   . GERD (gastroesophageal reflux disease)   . Hyperthyroidism     Ambert Virrueta PT, DPT 06/22/2017, 11:12 AM  [place] MAIN [facility] SERVICES [address] [place], [place], [address] Phone: [phone]   Fax:  [phone]  Name: Seryna Marek MRN: [id number] Date of Birth: 19-Aug-1941

## 2017-06-23 ENCOUNTER — Other Ambulatory Visit: Payer: Self-pay

## 2017-06-23 ENCOUNTER — Ambulatory Visit (INDEPENDENT_AMBULATORY_CARE_PROVIDER_SITE_OTHER): Payer: Medicare Other

## 2017-06-23 DIAGNOSIS — Z23 Encounter for immunization: Secondary | ICD-10-CM

## 2017-06-23 MED ORDER — GUAIFENESIN-CODEINE 100-10 MG/5ML PO SYRP: 5.0000 mL | ORAL_SOLUTION | Freq: Three times a day (TID) | ORAL | 0 refills | Status: DC | PRN

## 2017-06-23 NOTE — Telephone Encounter (Signed)
Dr [name] sent me a message during spouse's OV to send in rx for Robitussin with Codeine. I have called it in. I could not find it at 1st so I had called the pt. If she calls back, just let her know the rx was called in to [facility] [address]

## 2017-06-26 ENCOUNTER — Telehealth: Payer: Self-pay | Admitting: Internal Medicine

## 2017-06-26 NOTE — Telephone Encounter (Signed)
Best number [phone]  Returned your call

## 2017-06-26 NOTE — Telephone Encounter (Signed)
See husband's lab info

## 2017-06-27 ENCOUNTER — Encounter: Payer: Self-pay | Admitting: Physical Therapy

## 2017-06-27 ENCOUNTER — Ambulatory Visit: Payer: Medicare Other | Attending: Neurosurgery | Admitting: Physical Therapy

## 2017-06-27 ENCOUNTER — Other Ambulatory Visit: Payer: Self-pay | Admitting: Internal Medicine

## 2017-06-27 DIAGNOSIS — R262 Difficulty in walking, not elsewhere classified: Secondary | ICD-10-CM

## 2017-06-27 DIAGNOSIS — M6281 Muscle weakness (generalized): Secondary | ICD-10-CM | POA: Insufficient documentation

## 2017-06-27 DIAGNOSIS — R2681 Unsteadiness on feet: Secondary | ICD-10-CM | POA: Diagnosis not present

## 2017-06-27 NOTE — Patient Instructions (Addendum)
Tandem Stance    Stand beside counter, start with holding on, place one foot in front of other foot. As you feel steady, start to lift your hand (but let it hover over counter for balance) hold for 10 sec each foot in front x3 each; Copyright  [employer]. All rights reserved.  Feet Together, Arm Motion - Eyes Open    Standing in corner, feet together, hold object in both hands and move hands side to side touching wall/doorway x10 each direction;   Copyright  VHI. All rights reserved.

## 2017-06-27 NOTE — Therapy (Signed)
[place] MAIN [facility] SERVICES [address] [place], [place], [address] Phone: [phone]   Fax:  [phone]  Physical Therapy Treatment  Patient Details  Name: Angela Bentley MRN: [id number] Date of Birth: Jul 14, 1941 Referring Provider: Dr. Lacinda Axon  Encounter Date: 06/27/2017      PT End of Session - 06/27/17 0930    Visit Number 4   Number of Visits 13   Date for PT Re-Evaluation 07/26/17   Authorization Type G code 4   Authorization Time Period 10   PT Start Time 0924   PT Stop Time 1010   PT Time Calculation (min) 46 min   Equipment Utilized During Treatment Gait belt   Activity Tolerance Patient tolerated treatment well   Behavior During Therapy [facility] for tasks assessed/performed      Past Medical History:  Diagnosis Date  . Generalized osteoarthritis of multiple sites   . GERD (gastroesophageal reflux disease)   . History of DVT (deep vein thrombosis) 2016   estrogen and airflights. Rx with xarelto for 3 months  . Hypertension   . Hyperthyroidism    treated briefly in college  . Insomnia   . Retinal tear of left eye   . Rosacea   . Type 2 diabetes, controlled, with peripheral neuropathy (HCC)    Last A1C was a near 7.1  . Uterine fibroid     Past Surgical History:  Procedure Laterality Date  . CARPAL TUNNEL RELEASE Right    Dr Burney Gauze  . CATARACT EXTRACTION    . RETINAL TEAR REPAIR CRYOTHERAPY     10/09/13, then again 3/15    There were no vitals filed for this visit.      Subjective Assessment - 06/27/17 0928    Subjective Patient reports that she still has her cough and has been taking medication for her cold. She reports that she has been feeling more secure when walking her dog, but is more unsteady with HEP standing heel to toe;    Pertinent History 76 y/o female with self-reported decrease in balance over the last 2 months following sudden onset of dizziness and ataxia on 04/01/17. Pt was initially diagnosed  with dehydration at a minute clinic before follow-up with neurology. Pt has had a recent dx of Left ventricular brain tumor. Per office visit with neurologist on 8/28, "I cannot relate balance deficits completely to this mass although there is a possibility it is contributing". Recent CT scan showed no evidence of primarily malignancy elsewhere. Currently tumor is being monitored with follow-up MRI planned for 2-3 months from 05/23/17. Pt's current balance deficits are worse with first steps fllowing static standing or sitting, though pt reports no imbalance on initail standing. Pt has medical Hx significant for a-fib, DM2, and HTN. No Hx of cancer. Stress test on 12/15/16 indicated that pt was hypertensive before during and after stress test. Blood pressure response was appropriate with exaggerated heart rate response to exercise noted. Pt feels nauseated with water aerobics. Pt reports low visual acuity on R side that has not changed since birth. Pt reports she has ocular migraines that intermittently cause her to see auras on the L   Limitations Walking;House hold activities   How long can you sit comfortably? Arthritis related pain with prolonged sitting.   How long can you stand comfortably? Pt reports no balance issues in static stance   How long can you walk comfortably? Self reported balance decifits walking that do not get worse with time  Diagnostic tests MRI brain with and without contrast: There is a 2.5-3 cm enhancing mass situated within the atrium of the left ventricle. There is enlargement of the entire ventricular system out of proportion to the location of this mass. There is global atrophy noted within the brain. There are no other enhancing masses seen.    Patient Stated Goals To improve balance   Currently in Pain? No/denies         TREATMENT: Warm up on Nustep BUE/BLE level 2 x4 min (Unbilled);   Standing on airex x2: Modified tandem, eyes open/closed 10 sec hold x3 reps each,  each foot in front, with min A, patient able to progress with less wobble and better stance control with eyes closed with increased repetitions;  Heel/toe raises x15 without rail assist with cues to utilize bars as needed for balance;  Alternate toe taps on 10 inch step x15 each foot without rail assist with min A for safety; Standing one foot on airex, one foot on step, BUE ball pass side/side x10 each direction, each foot on step; Modified tandem stance, BUE ball toss up/down x10 reps each foot in front;  Patient required min VCs for balance stability, including to increase trunk control for less loss of balance with smaller base of support   Standing on trampoline: Gentle bounce x1 min; Alternate march x10 reps with and without mirror for visual feedback; Patient reports no change in difficulty with and without visual cues; She does require cues to slow down march for increased challenge in stance phase;  Resisted gait, 12.5# forward/backward, side/side x2 reps each direction with min A for safety and cues to slow down eccentric return for better safety;   Patient tolerated session well; Advanced HEP- see patient instructions;                         PT Education - 06/27/17 0929    Education provided Yes   Education Details  balance, HEP reinforced;    Person(s) Educated Patient   Methods Explanation;Demonstration;Verbal cues   Comprehension Verbalized understanding;Verbal cues required;Returned demonstration;Need further instruction          PT Short Term Goals - 06/14/17 2023      PT SHORT TERM GOAL #1   Title  Patient will continue to deny any falls over past 4 weeks to demonstrate successful compensatory strategies and continued safety awareness at home and in community.    Baseline Pt is fall rsik   Time 4   Period Weeks   Status New   Target Date 07/12/17     PT SHORT TERM GOAL #2   Title Pt will perform HEP at least 3/7 days/wk in order to show  meaningful improvement in strength and balance for improved functional mobility and safety.   Time 4   Period Weeks   Status New   Target Date 07/12/17           PT Long Term Goals - 06/14/17 2025      PT LONG TERM GOAL #1   Title Patient will be independent in home exercise program to improve strength/mobility for better functional independence with ADLs.   Time 6   Period Weeks   Status New   Target Date 07/26/17     PT LONG TERM GOAL #2   Title Patient will complete five times sit to stand test in < 12 seconds indicating an increased LE strength and improved balance in older adult.  Baseline 15.15 sec with no UE support (>12 sec discriminates faller from non-fallers in older adults)   Time 6   Period Weeks   Status New   Target Date 07/26/17     PT LONG TERM GOAL #3   Title Patient will increase Functional Gait Assessment score to >28/30 as to reduce fall risk and improve dynamic gait safety with community ambulation.   Baseline 24/30 indicating pt is a moderate fall risk.   Time 6   Period Weeks   Status New   Target Date 07/26/17     PT LONG TERM GOAL #4   Title Patient will increase ABC scale score >80% to demonstrate better functional mobility and better confidence with ADLs.    Baseline 67.5% confidence with balance (32.5% disability)   Time 6   Period Weeks   Status New   Target Date 07/26/17     PT LONG TERM GOAL #5   Title Patient will increase BLE gross strength to 4+/5 as to improve functional strength for independent gait, increased standing tolerance and increased ADL ability.   Time 6   Period Weeks   Status New   Target Date 07/26/17     PT LONG TERM GOAL #6   Title Pt will ambulate with normal stride width to demonstrate improved gait mechanics with less compensation of balance,  for more efficient and functional mobility   Time 6   Period Weeks   Status New   Target Date 07/26/17               Plan - 06/27/17 1259    Clinical  Impression Statement Patient instructed in advanced balance exercise. Progressed dynamic balance with resisted walking. patient denies any dizziness with this task. She does report dizziness and is unsteady with ball pass laterally. Advanced HEP to improve upper trunk control and work on narrow base of support. patient does require min Vcs to slow down UE movement and trunk control for better balance. 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   PT Frequency 2x / week   PT Duration 6 weeks   PT Treatment/Interventions ADLs/Self Care Home Management;Biofeedback;Gait training;Stair training;Balance training;Therapeutic exercise;Therapeutic activities;Functional mobility training;Neuromuscular re-education;Patient/family education;Energy conservation;Visual/perceptual remediation/compensation   PT Home Exercise Plan Static and dynamic balance   Consulted and Agree with Plan of Care Patient      Patient will benefit from skilled therapeutic intervention in order to improve the following deficits and impairments:  Abnormal gait, Decreased activity tolerance, Decreased balance, Decreased endurance, Decreased mobility, Decreased strength, Dizziness, Difficulty walking, Impaired perceived functional ability, Improper body mechanics, Impaired vision/preception  Visit Diagnosis: Unsteadiness on feet  Muscle weakness (generalized)  Difficulty in walking, not elsewhere classified     Problem List Patient Active Problem List   Diagnosis Date Noted  . Dizziness 04/06/2017  . PAF (paroxysmal atrial fibrillation) ([place]) 02/03/2017  . DOE (dyspnea on exertion) 12/01/2016  . Palpitations 11/03/2016  . Acute right-sided low back pain without sciatica 08/26/2016  . Preventative health care 06/02/2016  . Advance directive discussed with  patient 06/02/2016  . Achilles tendonitis 03/11/2016  . Hypertension   . History of DVT (deep vein thrombosis)   . Type 2 diabetes, controlled, with peripheral neuropathy ([place])   . Generalized osteoarthritis of multiple sites   . GERD (gastroesophageal reflux disease)   .  Hyperthyroidism     Zacchaeus Halm PT, DPT 06/27/2017, 1:01 PM  [place] MAIN [facility] SERVICES [address] [place], [place], [address] Phone: [phone]   Fax:  [phone]  Name: Sharonlee Nine MRN: [id number] Date of Birth: 10-13-1940

## 2017-06-28 MED ORDER — GUAIFENESIN-CODEINE 100-10 MG/5ML PO SYRP
5.0000 mL | ORAL_SOLUTION | Freq: Three times a day (TID) | ORAL | 0 refills | Status: DC | PRN
Start: 2017-06-28 — End: 2017-09-21

## 2017-06-28 MED ORDER — HYDROCODONE-HOMATROPINE 5-1.5 MG/5ML PO SYRP
5.0000 mL | ORAL_SOLUTION | Freq: Every evening | ORAL | 0 refills | Status: DC | PRN
Start: 2017-06-28 — End: 2018-03-23

## 2017-06-28 NOTE — Telephone Encounter (Signed)
Please let her know that I refilled the prescription but she will need to pick up the written copy

## 2017-06-29 ENCOUNTER — Ambulatory Visit: Payer: Medicare Other | Admitting: Physical Therapy

## 2017-07-04 ENCOUNTER — Ambulatory Visit: Payer: Medicare Other | Admitting: Physical Therapy

## 2017-07-04 ENCOUNTER — Encounter: Payer: Self-pay | Admitting: Physical Therapy

## 2017-07-04 DIAGNOSIS — R2681 Unsteadiness on feet: Secondary | ICD-10-CM

## 2017-07-04 DIAGNOSIS — R262 Difficulty in walking, not elsewhere classified: Secondary | ICD-10-CM

## 2017-07-04 DIAGNOSIS — M6281 Muscle weakness (generalized): Secondary | ICD-10-CM

## 2017-07-04 NOTE — Therapy (Signed)
[place] MAIN [facility] SERVICES [address] [place], [place], [address] Phone: [phone]   Fax:  [phone]  Physical Therapy Treatment  Patient Details  Name: Angela Bentley MRN: [id number] Date of Birth: 1940/10/19 Referring Provider: Dr. Lacinda Axon  Encounter Date: 07/04/2017      PT End of Session - 07/04/17 0953    Visit Number 5   Number of Visits 13   Date for PT Re-Evaluation 07/26/17   Authorization Type G code 5   Authorization Time Period 10   PT Start Time 0945   PT Stop Time 1030   PT Time Calculation (min) 45 min   Equipment Utilized During Treatment Gait belt   Activity Tolerance Patient tolerated treatment well   Behavior During Therapy [facility] for tasks assessed/performed      Past Medical History:  Diagnosis Date  . Generalized osteoarthritis of multiple sites   . GERD (gastroesophageal reflux disease)   . History of DVT (deep vein thrombosis) 2016   estrogen and airflights. Rx with xarelto for 3 months  . Hypertension   . Hyperthyroidism    treated briefly in college  . Insomnia   . Retinal tear of left eye   . Rosacea   . Type 2 diabetes, controlled, with peripheral neuropathy (HCC)    Last A1C was a near 7.1  . Uterine fibroid     Past Surgical History:  Procedure Laterality Date  . CARPAL TUNNEL RELEASE Right    Dr Burney Gauze  . CATARACT EXTRACTION    . RETINAL TEAR REPAIR CRYOTHERAPY     10/09/13, then again 3/15    There were no vitals filed for this visit.      Subjective Assessment - 07/04/17 0952    Subjective Patient reports that she is feeling a little better. She did get new medication with codiene for cough; She denies any new falls;  She denies any pain today; She reports having some dizziness this morning; She reports waking up with a headache;    Pertinent History 76 y/o female with self-reported decrease in balance over the last 2 months following sudden onset of dizziness and ataxia on  04/01/17. Pt was initially diagnosed with dehydration at a minute clinic before follow-up with neurology. Pt has had a recent dx of Left ventricular brain tumor. Per office visit with neurologist on 8/28, "I cannot relate balance deficits completely to this mass although there is a possibility it is contributing". Recent CT scan showed no evidence of primarily malignancy elsewhere. Currently tumor is being monitored with follow-up MRI planned for 2-3 months from 05/23/17. Pt's current balance deficits are worse with first steps fllowing static standing or sitting, though pt reports no imbalance on initail standing. Pt has medical Hx significant for a-fib, DM2, and HTN. No Hx of cancer. Stress test on 12/15/16 indicated that pt was hypertensive before during and after stress test. Blood pressure response was appropriate with exaggerated heart rate response to exercise noted. Pt feels nauseated with water aerobics. Pt reports low visual acuity on R side that has not changed since birth. Pt reports she has ocular migraines that intermittently cause her to see auras on the L   Limitations Walking;House hold activities   How long can you sit comfortably? Arthritis related pain with prolonged sitting.   How long can you stand comfortably? Pt reports no balance issues in static stance   How long can you walk comfortably? Self reported balance decifits walking that do not get  worse with time   Diagnostic tests MRI brain with and without contrast: There is a 2.5-3 cm enhancing mass situated within the atrium of the left ventricle. There is enlargement of the entire ventricular system out of proportion to the location of this mass. There is global atrophy noted within the brain. There are no other enhancing masses seen.    Patient Stated Goals To improve balance   Currently in Pain? No/denies           TREATMENT: Warm up on Nustep BUE/BLE level 2 x4 min (Unbilled);   Standing on airex x2: Feet together, eyes  open/closed 10 sec hold x3 reps each,  with CGA, patient able to progress with less wobble and better stance control with eyes closed with increased repetitions;  Heel/toe raises x15 without rail assist with cues to utilize bars as needed for balance;  Alternate march x15 each foot without rail assist with min A for safety and cues to increase ROM and slow down step for better balance challenge; Modified tandem stance, BUE ball toss up/down x10 reps each foot in front;  Modified tandem stance, head turns side/side x5 reps each foot in front with min A for safety and cues to slow down head turns and utilize gaze stabilization for better balance control;  Patient required min VCs for balance stability, including to increase trunk control for less loss of balance with smaller base of support  Resisted gait, 12.5# forward/backward, side/side x2 reps each direction with min A for safety and cues to slow down eccentric return for better safety;  Weave through cones #6 x2 laps with supervision and min Vcs to increase turn for better balance challenge with stepping around obstacles. Walking beside cone with SLS toe taps #6 each LE with CGA for safety and cues to avoid heavy step on cone for better balance and stance control;  Picking up cones #6- patient reports increased dizziness when standing up. She reports that she often will be bent over for a while cleaning up after her dog. Recommended patient stand up slowly and pump arms to reduce orthostatic hypotension; Patient agreeable;   Patient tolerated session well;                        PT Education - 07/04/17 0952    Education provided Yes   Education Details balance, HEP reinforced;    Person(s) Educated Patient   Methods Explanation;Demonstration;Verbal cues   Comprehension Verbalized understanding;Returned demonstration;Verbal cues required;Need further instruction          PT Short Term Goals - 06/14/17 2023      PT  SHORT TERM GOAL #1   Title  Patient will continue to deny any falls over past 4 weeks to demonstrate successful compensatory strategies and continued safety awareness at home and in community.    Baseline Pt is fall rsik   Time 4   Period Weeks   Status New   Target Date 07/12/17     PT SHORT TERM GOAL #2   Title Pt will perform HEP at least 3/7 days/wk in order to show meaningful improvement in strength and balance for improved functional mobility and safety.   Time 4   Period Weeks   Status New   Target Date 07/12/17           PT Long Term Goals - 06/14/17 2025      PT LONG TERM GOAL #1   Title Patient will be independent  in home exercise program to improve strength/mobility for better functional independence with ADLs.   Time 6   Period Weeks   Status New   Target Date 07/26/17     PT LONG TERM GOAL #2   Title Patient will complete five times sit to stand test in < 12 seconds indicating an increased LE strength and improved balance in older adult.   Baseline 15.15 sec with no UE support (>12 sec discriminates faller from non-fallers in older adults)   Time 6   Period Weeks   Status New   Target Date 07/26/17     PT LONG TERM GOAL #3   Title Patient will increase Functional Gait Assessment score to >28/30 as to reduce fall risk and improve dynamic gait safety with community ambulation.   Baseline 24/30 indicating pt is a moderate fall risk.   Time 6   Period Weeks   Status New   Target Date 07/26/17     PT LONG TERM GOAL #4   Title Patient will increase ABC scale score >80% to demonstrate better functional mobility and better confidence with ADLs.    Baseline 67.5% confidence with balance (32.5% disability)   Time 6   Period Weeks   Status New   Target Date 07/26/17     PT LONG TERM GOAL #5   Title Patient will increase BLE gross strength to 4+/5 as to improve functional strength for independent gait, increased standing tolerance and increased ADL ability.    Time 6   Period Weeks   Status New   Target Date 07/26/17     PT LONG TERM GOAL #6   Title Pt will ambulate with normal stride width to demonstrate improved gait mechanics with less compensation of balance,  for more efficient and functional mobility   Time 6   Period Weeks   Status New   Target Date 07/26/17               Plan - 07/04/17 1106    Clinical Impression Statement Patient instructed in advanced balance exercise. She does require min Vcs for correct activity technhique, improve postural support and utilize visual cues for better balance. She had increased difficulty/dizziness with lateral head turns and when coming up from bending over. Patient educated on importance of slowly standing up from bent over position to reduce orthostatic hypotension.She reports that she will often be bent over when cleaning up after her dog. Patient also expressed concern over her vision. PT recommended patient follow up with neuro opthamologist given visual deficits and new brain tumor. 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   PT Frequency 2x / week   PT Duration 6 weeks   PT Treatment/Interventions ADLs/Self Care Home Management;Biofeedback;Gait training;Stair training;Balance training;Therapeutic exercise;Therapeutic activities;Functional mobility training;Neuromuscular re-education;Patient/family education;Energy conservation;Visual/perceptual remediation/compensation   PT Home Exercise Plan Static and dynamic balance   Consulted and Agree with Plan of Care Patient      Patient will benefit from skilled therapeutic intervention in order to improve the following deficits and impairments:  Abnormal gait, Decreased activity tolerance, Decreased balance, Decreased endurance, Decreased mobility, Decreased  strength, Dizziness, Difficulty walking, Impaired perceived functional ability, Improper body mechanics, Impaired vision/preception  Visit Diagnosis: Unsteadiness on feet  Muscle weakness (generalized)  Difficulty in walking, not elsewhere classified     Problem  List Patient Active Problem List   Diagnosis Date Noted  . Dizziness 04/06/2017  . PAF (paroxysmal atrial fibrillation) ([place]) 02/03/2017  . DOE (dyspnea on exertion) 12/01/2016  . Palpitations 11/03/2016  . Acute right-sided low back pain without sciatica 08/26/2016  . Preventative health care 06/02/2016  . Advance directive discussed with patient 06/02/2016  . Achilles tendonitis 03/11/2016  . Hypertension   . History of DVT (deep vein thrombosis)   . Type 2 diabetes, controlled, with peripheral neuropathy ([place])   . Generalized osteoarthritis of multiple sites   . GERD (gastroesophageal reflux disease)   . Hyperthyroidism     Jayton Popelka PT, DPT 07/04/2017, 11:10 AM  [place] MAIN [facility] SERVICES [address] [place], [place], [address] Phone: [phone]   Fax:  [phone]  Name: Angela Bentley MRN: [id number] Date of Birth: 09/01/1941

## 2017-07-05 DIAGNOSIS — G939 Disorder of brain, unspecified: Secondary | ICD-10-CM | POA: Diagnosis not present

## 2017-07-06 ENCOUNTER — Ambulatory Visit: Payer: Medicare Other | Admitting: Physical Therapy

## 2017-07-06 ENCOUNTER — Encounter: Payer: Self-pay | Admitting: Physical Therapy

## 2017-07-06 DIAGNOSIS — R2681 Unsteadiness on feet: Secondary | ICD-10-CM | POA: Diagnosis not present

## 2017-07-06 DIAGNOSIS — R262 Difficulty in walking, not elsewhere classified: Secondary | ICD-10-CM | POA: Diagnosis not present

## 2017-07-06 DIAGNOSIS — M6281 Muscle weakness (generalized): Secondary | ICD-10-CM

## 2017-07-06 NOTE — Therapy (Signed)
[place] MAIN [facility] SERVICES [address] [place], [place], [address] Phone: [phone]   Fax:  [phone]  Physical Therapy Treatment  Patient Details  Name: Angela Bentley MRN: [id number] Date of Birth: 19-Jan-1941 Referring Provider: Dr. Lacinda Axon  Encounter Date: 07/06/2017      PT End of Session - 07/06/17 0940    Visit Number 6   Number of Visits 13   Date for PT Re-Evaluation 07/26/17   Authorization Type G code 6   Authorization Time Period 10   PT Start Time 0935   PT Stop Time 1018   PT Time Calculation (min) 43 min   Equipment Utilized During Treatment Gait belt   Activity Tolerance Patient tolerated treatment well   Behavior During Therapy [facility] for tasks assessed/performed      Past Medical History:  Diagnosis Date  . Generalized osteoarthritis of multiple sites   . GERD (gastroesophageal reflux disease)   . History of DVT (deep vein thrombosis) 2016   estrogen and airflights. Rx with xarelto for 3 months  . Hypertension   . Hyperthyroidism    treated briefly in college  . Insomnia   . Retinal tear of left eye   . Rosacea   . Type 2 diabetes, controlled, with peripheral neuropathy (HCC)    Last A1C was a near 7.1  . Uterine fibroid     Past Surgical History:  Procedure Laterality Date  . CARPAL TUNNEL RELEASE Right    Dr Burney Gauze  . CATARACT EXTRACTION    . RETINAL TEAR REPAIR CRYOTHERAPY     10/09/13, then again 3/15    There were no vitals filed for this visit.      Subjective Assessment - 07/06/17 0938    Subjective Patient reports getting MRI yesterday and states that she hasn't heard anything yet. Patient reports feeling a little frantic as she can't find one of her credit cards; Denies any headache; Reports feeling better;    Pertinent History 76 y/o female with self-reported decrease in balance over the last 2 months following sudden onset of dizziness and ataxia on 04/01/17. Pt was initially diagnosed  with dehydration at a minute clinic before follow-up with neurology. Pt has had a recent dx of Left ventricular brain tumor. Per office visit with neurologist on 8/28, "I cannot relate balance deficits completely to this mass although there is a possibility it is contributing". Recent CT scan showed no evidence of primarily malignancy elsewhere. Currently tumor is being monitored with follow-up MRI planned for 2-3 months from 05/23/17. Pt's current balance deficits are worse with first steps fllowing static standing or sitting, though pt reports no imbalance on initail standing. Pt has medical Hx significant for a-fib, DM2, and HTN. No Hx of cancer. Stress test on 12/15/16 indicated that pt was hypertensive before during and after stress test. Blood pressure response was appropriate with exaggerated heart rate response to exercise noted. Pt feels nauseated with water aerobics. Pt reports low visual acuity on R side that has not changed since birth. Pt reports she has ocular migraines that intermittently cause her to see auras on the L   Limitations Walking;House hold activities   How long can you sit comfortably? Arthritis related pain with prolonged sitting.   How long can you stand comfortably? Pt reports no balance issues in static stance   How long can you walk comfortably? Self reported balance decifits walking that do not get worse with time   Diagnostic tests MRI brain  with and without contrast: There is a 2.5-3 cm enhancing mass situated within the atrium of the left ventricle. There is enlargement of the entire ventricular system out of proportion to the location of this mass. There is global atrophy noted within the brain. There are no other enhancing masses seen.    Patient Stated Goals To improve balance   Currently in Pain? No/denies          TREATMENT: Warm up on Nustep BUE/BLE level 2 x4 min (Unbilled);   Standing on airex x2: Feet together,closed with lateral head turns side/side  2x5 reps with min A to CGA; Patient required cues to visualize objects when turning head for better safety and to improve upper trunk control for better static balance. Tandem stance, head turns side/side x5 reps each foot in front with min A for safety and cues to slow down head turns and utilize gaze stabilization for better balance control;  Tandem stance with eyes open/closed 10 sec hold x3 reps each foot in front with min A for safety; patient exhibits better stance control with left foot in front of right foot.  Patient required min VCs for balance stability, including to increase trunk control for less loss of balance with smaller base of support  Resisted gait, 17.5# (4 way) forward/backward, side/side x2 reps each direction with min A for safety and cues to slow down eccentric return for better safety;  Standing on rockerboard: BLE calf stretch 20 sec hold x2 reps with cues for foot placement to improve tissue extensibility; Forward/backward teeter with 2-0 rail assist x2 min with cues to slow down LE movement for better ankle control and to improve erect posture;  Standing on incline unsupported, eyes open/closed 10 sec hold x1 requiring CGA for safety; Standing on decline unsupported, eyes open/closed 10 sec hold x1 requiring close supervision; Patient able to exhibit better stance control on incline/decline as compared to airex pads; She did require cues to improve weight shift for better neutral stance;   Patient tolerated session well;                          PT Education - 07/06/17 0939    Education provided Yes   Education Details balance, HEP reinforced;    Person(s) Educated Patient   Methods Explanation;Demonstration;Verbal cues   Comprehension Verbalized understanding;Returned demonstration;Verbal cues required;Need further instruction          PT Short Term Goals - 06/14/17 2023      PT SHORT TERM GOAL #1   Title  Patient will continue to  deny any falls over past 4 weeks to demonstrate successful compensatory strategies and continued safety awareness at home and in community.    Baseline Pt is fall rsik   Time 4   Period Weeks   Status New   Target Date 07/12/17     PT SHORT TERM GOAL #2   Title Pt will perform HEP at least 3/7 days/wk in order to show meaningful improvement in strength and balance for improved functional mobility and safety.   Time 4   Period Weeks   Status New   Target Date 07/12/17           PT Long Term Goals - 06/14/17 2025      PT LONG TERM GOAL #1   Title Patient will be independent in home exercise program to improve strength/mobility for better functional independence with ADLs.   Time 6   Period  Weeks   Status New   Target Date 07/26/17     PT LONG TERM GOAL #2   Title Patient will complete five times sit to stand test in < 12 seconds indicating an increased LE strength and improved balance in older adult.   Baseline 15.15 sec with no UE support (>12 sec discriminates faller from non-fallers in older adults)   Time 6   Period Weeks   Status New   Target Date 07/26/17     PT LONG TERM GOAL #3   Title Patient will increase Functional Gait Assessment score to >28/30 as to reduce fall risk and improve dynamic gait safety with community ambulation.   Baseline 24/30 indicating pt is a moderate fall risk.   Time 6   Period Weeks   Status New   Target Date 07/26/17     PT LONG TERM GOAL #4   Title Patient will increase ABC scale score >80% to demonstrate better functional mobility and better confidence with ADLs.    Baseline 67.5% confidence with balance (32.5% disability)   Time 6   Period Weeks   Status New   Target Date 07/26/17     PT LONG TERM GOAL #5   Title Patient will increase BLE gross strength to 4+/5 as to improve functional strength for independent gait, increased standing tolerance and increased ADL ability.   Time 6   Period Weeks   Status New   Target Date  07/26/17     PT LONG TERM GOAL #6   Title Pt will ambulate with normal stride width to demonstrate improved gait mechanics with less compensation of balance,  for more efficient and functional mobility   Time 6   Period Weeks   Status New   Target Date 07/26/17               Plan - 07/06/17 1023    Clinical Impression Statement Patient instructed in advanced balance exercise. She was able to stand on incline well without unsteadiness with eyes closed. She does still have difficulty with narrow base of support (tandem stance) especially with eyes closed or lateral head turns. 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   PT Frequency 2x / week   PT Duration 6 weeks   PT Treatment/Interventions ADLs/Self Care Home Management;Biofeedback;Gait training;Stair training;Balance training;Therapeutic exercise;Therapeutic activities;Functional mobility training;Neuromuscular re-education;Patient/family education;Energy conservation;Visual/perceptual remediation/compensation   PT Home Exercise Plan cotninue as given;    Consulted and Agree with Plan of Care Patient      Patient will benefit from skilled therapeutic intervention in order to improve the following deficits and impairments:  Abnormal gait, Decreased activity tolerance, Decreased balance, Decreased endurance, Decreased mobility, Decreased strength, Dizziness, Difficulty walking, Impaired perceived functional ability, Improper body mechanics, Impaired vision/preception  Visit Diagnosis: Unsteadiness on feet  Muscle weakness (generalized)  Difficulty in walking, not elsewhere classified     Problem List Patient Active Problem List   Diagnosis Date Noted  . Dizziness 04/06/2017  . PAF (paroxysmal atrial fibrillation) ([place]) 02/03/2017  . DOE (dyspnea on  exertion) 12/01/2016  . Palpitations 11/03/2016  . Acute right-sided low back pain without sciatica 08/26/2016  . Preventative health care 06/02/2016  . Advance directive discussed with patient 06/02/2016  . Achilles tendonitis 03/11/2016  . Hypertension   . History of DVT (deep vein thrombosis)   .  Type 2 diabetes, controlled, with peripheral neuropathy ([place])   . Generalized osteoarthritis of multiple sites   . GERD (gastroesophageal reflux disease)   . Hyperthyroidism     Trotter,Margaret PT, DPT 07/06/2017, 10:25 AM   [place] MAIN [facility] SERVICES [address] [place], [place], [address] Phone: [phone]   Fax:  [phone]  Name: Angela Bentley MRN: [id number] Date of Birth: 08-08-1941

## 2017-07-11 ENCOUNTER — Encounter: Payer: Self-pay | Admitting: Physical Therapy

## 2017-07-11 ENCOUNTER — Ambulatory Visit: Payer: Medicare Other | Admitting: Physical Therapy

## 2017-07-11 DIAGNOSIS — R262 Difficulty in walking, not elsewhere classified: Secondary | ICD-10-CM

## 2017-07-11 DIAGNOSIS — R2681 Unsteadiness on feet: Secondary | ICD-10-CM | POA: Diagnosis not present

## 2017-07-11 DIAGNOSIS — M6281 Muscle weakness (generalized): Secondary | ICD-10-CM | POA: Diagnosis not present

## 2017-07-11 NOTE — Therapy (Signed)
[place] MAIN [facility] SERVICES [address] [place], [place], [address] Phone: [phone]   Fax:  [phone]  Physical Therapy Treatment/Progress Note  Patient Details  Name: Angela Bentley MRN: [id number] Date of Birth: 07-21-1941 Referring Provider: Dr. Lacinda Axon  Encounter Date: 07/11/2017      PT End of Session - 07/11/17 0939    Visit Number 7   Number of Visits 13   Date for PT Re-Evaluation 07/26/17   Authorization Type G code 7   Authorization Time Period 10   PT Start Time 0930   PT Stop Time 1015   PT Time Calculation (min) 45 min   Equipment Utilized During Treatment Gait belt   Activity Tolerance Patient tolerated treatment well   Behavior During Therapy [facility] for tasks assessed/performed      Past Medical History:  Diagnosis Date  . Generalized osteoarthritis of multiple sites   . GERD (gastroesophageal reflux disease)   . History of DVT (deep vein thrombosis) 2016   estrogen and airflights. Rx with xarelto for 3 months  . Hypertension   . Hyperthyroidism    treated briefly in college  . Insomnia   . Retinal tear of left eye   . Rosacea   . Type 2 diabetes, controlled, with peripheral neuropathy (HCC)    Last A1C was a near 7.1  . Uterine fibroid     Past Surgical History:  Procedure Laterality Date  . CARPAL TUNNEL RELEASE Right    Dr Burney Gauze  . CATARACT EXTRACTION    . RETINAL TEAR REPAIR CRYOTHERAPY     10/09/13, then again 3/15    There were no vitals filed for this visit.      Subjective Assessment - 07/11/17 0937    Subjective Patient reports doing okay this morning when she woke up but then now she is feeling woozy headed. Unsure why this changed. Denies any pain; Reports that she is going to follow up with eye doctor as she is concerned that her vision could be contributing to her dizziness.    Pertinent History 76 y/o female with self-reported decrease in balance over the last 2 months following  sudden onset of dizziness and ataxia on 04/01/17. Pt was initially diagnosed with dehydration at a minute clinic before follow-up with neurology. Pt has had a recent dx of Left ventricular brain tumor. Per office visit with neurologist on 8/28, "I cannot relate balance deficits completely to this mass although there is a possibility it is contributing". Recent CT scan showed no evidence of primarily malignancy elsewhere. Currently tumor is being monitored with follow-up MRI planned for 2-3 months from 05/23/17. Pt's current balance deficits are worse with first steps fllowing static standing or sitting, though pt reports no imbalance on initail standing. Pt has medical Hx significant for a-fib, DM2, and HTN. No Hx of cancer. Stress test on 12/15/16 indicated that pt was hypertensive before during and after stress test. Blood pressure response was appropriate with exaggerated heart rate response to exercise noted. Pt feels nauseated with water aerobics. Pt reports low visual acuity on R side that has not changed since birth. Pt reports she has ocular migraines that intermittently cause her to see auras on the L   Limitations Walking;House hold activities   How long can you sit comfortably? Arthritis related pain with prolonged sitting.   How long can you stand comfortably? Pt reports no balance issues in static stance   How long can you walk comfortably? Self reported  balance decifits walking that do not get worse with time   Diagnostic tests MRI brain with and without contrast: There is a 2.5-3 cm enhancing mass situated within the atrium of the left ventricle. There is enlargement of the entire ventricular system out of proportion to the location of this mass. There is global atrophy noted within the brain. There are no other enhancing masses seen.    Patient Stated Goals To improve balance   Currently in Pain? No/denies            [facility] PT Assessment - 07/11/17 0001      Observation/Other Assessments    Activities of Balance Confidence Scale (ABC Scale)  70% (the lower the score the greater fear of falling) improved from initial eval which was 67.5%      Strength   Overall Strength Comments BLE gross strength is 4+/5     Standardized Balance Assessment   Five times sit to stand comments  14 sec without HHA (<15 sec indicates low fall risk)improved from initial eval which was 15.18 sec without HHA     Functional Gait  Assessment   Gait Level Surface Walks 20 ft in less than 5.5 sec, no assistive devices, good speed, no evidence for imbalance, normal gait pattern, deviates no more than 6 in outside of the 12 in walkway width.   Change in Gait Speed Able to smoothly change walking speed without loss of balance or gait deviation. Deviate no more than 6 in outside of the 12 in walkway width.   Gait with Horizontal Head Turns Performs head turns smoothly with slight change in gait velocity (eg, minor disruption to smooth gait path), deviates 6-10 in outside 12 in walkway width, or uses an assistive device.   Gait with Vertical Head Turns Performs head turns with no change in gait. Deviates no more than 6 in outside 12 in walkway width.   Gait and Pivot Turn Pivot turns safely within 3 sec and stops quickly with no loss of balance.   Step Over Obstacle Is able to step over 2 stacked shoe boxes taped together (9 in total height) without changing gait speed. No evidence of imbalance.   Gait with Narrow Base of Support Ambulates 7-9 steps.   Gait with Eyes Closed Walks 20 ft, uses assistive device, slower speed, mild gait deviations, deviates 6-10 in outside 12 in walkway width. Ambulates 20 ft in less than 9 sec but greater than 7 sec.   Ambulating Backwards Walks 20 ft, no assistive devices, good speed, no evidence for imbalance, normal gait   Steps Alternating feet, no rail.   Total Score 27 (low fall risk, improved from eval which was 24/30)      TREATMENT: Warm up on Nustep BUE/BLE level 3 x4  min (Unbilled);  Instructed patient in 5 times sit<>stand, FGA, strength etc to address goals; see above;   Resisted gait, 17.5# (4 way) forward/backward (lateral head turns), side/side (vertical head turns) x2 reps each direction with head turns to challenge dynamic balance, with min A for safety and cues to slow down eccentric return for better safety;  Patient tolerated session well; She did have a harder time with dynamic movement with head turns. Reinforced HEP;                          PT Education - 07/11/17 9476    Education provided Yes   Education Details balance, HEP reinforced;  Person(s) Educated Patient   Methods Explanation;Demonstration;Verbal cues   Comprehension Verbalized understanding;Returned demonstration;Verbal cues required;Need further instruction          PT Short Term Goals - 07/11/17 0939      PT SHORT TERM GOAL #1   Title  Patient will continue to deny any falls over past 4 weeks to demonstrate successful compensatory strategies and continued safety awareness at home and in community.    Baseline no falls;    Time 4   Period Weeks   Status Achieved     PT SHORT TERM GOAL #2   Title Pt will perform HEP at least 3/7 days/wk in order to show meaningful improvement in strength and balance for improved functional mobility and safety.   Baseline very compliant;    Time 4   Period Weeks   Status Achieved           PT Long Term Goals - 07/11/17 0940      PT LONG TERM GOAL #1   Title Patient will be independent in home exercise program to improve strength/mobility for better functional independence with ADLs.   Time 6   Period Weeks   Status Partially Met   Target Date 07/26/17     PT LONG TERM GOAL #2   Title Patient will complete five times sit to stand test in < 12 seconds indicating an increased LE strength and improved balance in older adult.   Baseline 14 sec without HHA   Time 6   Period Weeks   Status  Partially Met   Target Date 07/26/17     PT LONG TERM GOAL #3   Title Patient will increase Functional Gait Assessment score to >28/30 as to reduce fall risk and improve dynamic gait safety with community ambulation.   Baseline 27/30   Time 6   Period Weeks   Status Partially Met   Target Date 07/26/17     PT LONG TERM GOAL #4   Title Patient will increase ABC scale score >80% to demonstrate better functional mobility and better confidence with ADLs.    Baseline 70% confident;    Time 6   Period Weeks   Status Partially Met   Target Date 07/26/17     PT LONG TERM GOAL #5   Title Patient will increase BLE gross strength to 4+/5 as to improve functional strength for independent gait, increased standing tolerance and increased ADL ability.   Time 6   Period Weeks   Status Achieved   Target Date 07/26/17     PT LONG TERM GOAL #6   Title Pt will ambulate with normal stride width to demonstrate improved gait mechanics with less compensation of balance,  for more efficient and functional mobility   Time 6   Period Weeks   Status Achieved   Target Date 07/26/17               Plan - 07/11/17 1235    Clinical Impression Statement Patient instructed in outcome measures to address goals. She has made some progress but still exhibits an increased risk for falls. Patient is most unsteady during dynamic tasks especially with lateral head turns. Advanced balance exercise with adding head turns to resisted walking. patient required min Vcs for gaze stabilization and to slow down eccentric return for better dynamic balance. 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   PT Frequency 2x / week   PT Duration 6 weeks   PT Treatment/Interventions ADLs/Self Care Home Management;Biofeedback;Gait training;Stair  training;Balance training;Therapeutic exercise;Therapeutic activities;Functional mobility training;Neuromuscular re-education;Patient/family education;Energy conservation;Visual/perceptual remediation/compensation   PT Home Exercise Plan cotninue as given;    Consulted and Agree with Plan of Care Patient      Patient will benefit from skilled therapeutic intervention in order to improve the following deficits and impairments:  Abnormal gait, Decreased activity tolerance, Decreased balance, Decreased endurance, Decreased mobility, Decreased strength, Dizziness, Difficulty walking, Impaired perceived functional ability, Improper body mechanics, Impaired vision/preception  Visit Diagnosis: Unsteadiness on feet  Muscle weakness (generalized)  Difficulty in walking, not elsewhere classified     Problem List Patient Active Problem List   Diagnosis Date Noted  . Dizziness 04/06/2017  . PAF (paroxysmal atrial fibrillation) ([place]) 02/03/2017  . DOE (dyspnea on exertion) 12/01/2016  . Palpitations 11/03/2016  . Acute right-sided low back pain without sciatica 08/26/2016  . Preventative health care 06/02/2016  . Advance directive discussed with patient 06/02/2016  . Achilles tendonitis 03/11/2016  . Hypertension   . History of DVT (deep vein thrombosis)   . Type 2 diabetes, controlled, with peripheral neuropathy ([place])   . Generalized osteoarthritis of multiple sites   . GERD (gastroesophageal reflux disease)   . Hyperthyroidism     Deuce Paternoster PT, DPT 07/11/2017, 12:37 PM  [place] MAIN [facility] SERVICES [address] [place], [place], [address] Phone: [phone]   Fax:  [phone]  Name: Casandra Dallaire MRN: [id number] Date of Birth: Jul 04, 1941

## 2017-07-13 ENCOUNTER — Ambulatory Visit: Payer: Medicare Other | Admitting: Physical Therapy

## 2017-07-13 ENCOUNTER — Encounter: Payer: Self-pay | Admitting: Physical Therapy

## 2017-07-13 DIAGNOSIS — R262 Difficulty in walking, not elsewhere classified: Secondary | ICD-10-CM

## 2017-07-13 DIAGNOSIS — M6281 Muscle weakness (generalized): Secondary | ICD-10-CM | POA: Diagnosis not present

## 2017-07-13 DIAGNOSIS — R2681 Unsteadiness on feet: Secondary | ICD-10-CM | POA: Diagnosis not present

## 2017-07-13 NOTE — Therapy (Signed)
[place] MAIN [facility] SERVICES [address] [place], [place], [address] Phone: [phone]   Fax:  [phone]  Physical Therapy Treatment  Patient Details  Name: Angela Bentley MRN: [id number] Date of Birth: 1941-01-10 Referring Provider: Dr. Lacinda Axon  Encounter Date: 07/13/2017      PT End of Session - 07/13/17 0937    Visit Number 8   Number of Visits 13   Date for PT Re-Evaluation 07/26/17   Authorization Type G code 8   Authorization Time Period 10   PT Start Time 0930   PT Stop Time 1015   PT Time Calculation (min) 45 min   Equipment Utilized During Treatment Gait belt   Activity Tolerance Patient tolerated treatment well   Behavior During Therapy [facility] for tasks assessed/performed      Past Medical History:  Diagnosis Date  . Generalized osteoarthritis of multiple sites   . GERD (gastroesophageal reflux disease)   . History of DVT (deep vein thrombosis) 2016   estrogen and airflights. Rx with xarelto for 3 months  . Hypertension   . Hyperthyroidism    treated briefly in college  . Insomnia   . Retinal tear of left eye   . Rosacea   . Type 2 diabetes, controlled, with peripheral neuropathy (HCC)    Last A1C was a near 7.1  . Uterine fibroid     Past Surgical History:  Procedure Laterality Date  . CARPAL TUNNEL RELEASE Right    Dr Burney Gauze  . CATARACT EXTRACTION    . RETINAL TEAR REPAIR CRYOTHERAPY     10/09/13, then again 3/15    There were no vitals filed for this visit.      Subjective Assessment - 07/13/17 0936    Subjective Patient reports not sleeping the last two nights and has had more dizziness. "I am concerned that I am going to have one of my spells again because that is how this all started."    Pertinent History 76 y/o female with self-reported decrease in balance over the last 2 months following sudden onset of dizziness and ataxia on 04/01/17. Pt was initially diagnosed with dehydration at a [facility] clinic  before follow-up with neurology. Pt has had a recent dx of Left ventricular brain tumor. Per office visit with neurologist on 8/28, "I cannot relate balance deficits completely to this mass although there is a possibility it is contributing". Recent CT scan showed no evidence of primarily malignancy elsewhere. Currently tumor is being monitored with follow-up MRI planned for 2-3 months from 05/23/17. Pt's current balance deficits are worse with first steps fllowing static standing or sitting, though pt reports no imbalance on initail standing. Pt has medical Hx significant for a-fib, DM2, and HTN. No Hx of cancer. Stress test on 12/15/16 indicated that pt was hypertensive before during and after stress test. Blood pressure response was appropriate with exaggerated heart rate response to exercise noted. Pt feels nauseated with water aerobics. Pt reports low visual acuity on R side that has not changed since birth. Pt reports she has ocular migraines that intermittently cause her to see auras on the L   Limitations Walking;House hold activities   How long can you sit comfortably? Arthritis related pain with prolonged sitting.   How long can you stand comfortably? Pt reports no balance issues in static stance   How long can you walk comfortably? Self reported balance decifits walking that do not get worse with time   Diagnostic tests MRI brain  with and without contrast: There is a 2.5-3 cm enhancing mass situated within the atrium of the left ventricle. There is enlargement of the entire ventricular system out of proportion to the location of this mass. There is global atrophy noted within the brain. There are no other enhancing masses seen.    Patient Stated Goals To improve balance   Currently in Pain? No/denies         TREATMENT: Warm up on Nustep BUE/BLE level 3 x4 min (Unbilled);  Resisted gait, 17.5# (4 way)forward/backward (lateral head turns), side/side (vertical head turns) x2 reps each  direction with head turns to challenge dynamic balance, with min A for safety and cues to slow down eccentric return for better safety;   Gait in hallway: Forward/backward with ball toss and catch x100 feet each; Forward walk with ball pass side/side x100 feet x2 laps each direction; Patient required cues to increase gaze stabilization for better gait safety and balance; She did have difficulty with turning head to left side as opposed to right side;  Tandem gait on airex beam x6 laps unsupported with cues to keep head erect for better balance challenge; Requires CGA for safety; Side stepping on airex beam x3 laps each direction;  Side step over airex beam x10 reps each direction, unsupported with cues to increase step length for better foot clearance; Forward/backward step over airex beam x5 reps unsupported with CGA for safety; patient exhibits increased unsteadiness with stepping backwards;  Forward step ups and over on 4 inch step unsupported x5 reps with head turns with CGA for safety;  Side step up and over while holding green pball x5 reps each direction with ball circles with CGA for safety; Patient exhibits no unsteadiness with lateral step ups but was unsteady when stepping forward with head turns;   Reports increased fatigue at end of session;                       PT Education - 07/13/17 0937    Education provided Yes   Education Details balance, HEP reinforced;    Person(s) Educated Patient   Methods Explanation;Demonstration;Verbal cues   Comprehension Verbalized understanding;Returned demonstration;Verbal cues required;Need further instruction          PT Short Term Goals - 07/11/17 0939      PT SHORT TERM GOAL #1   Title  Patient will continue to deny any falls over past 4 weeks to demonstrate successful compensatory strategies and continued safety awareness at home and in community.    Baseline no falls;    Time 4   Period Weeks   Status  Achieved     PT SHORT TERM GOAL #2   Title Pt will perform HEP at least 3/7 days/wk in order to show meaningful improvement in strength and balance for improved functional mobility and safety.   Baseline very compliant;    Time 4   Period Weeks   Status Achieved           PT Long Term Goals - 07/11/17 0940      PT LONG TERM GOAL #1   Title Patient will be independent in home exercise program to improve strength/mobility for better functional independence with ADLs.   Time 6   Period Weeks   Status Partially Met   Target Date 07/26/17     PT LONG TERM GOAL #2   Title Patient will complete five times sit to stand test in < 12 seconds indicating an  increased LE strength and improved balance in older adult.   Baseline 14 sec without HHA   Time 6   Period Weeks   Status Partially Met   Target Date 07/26/17     PT LONG TERM GOAL #3   Title Patient will increase Functional Gait Assessment score to >28/30 as to reduce fall risk and improve dynamic gait safety with community ambulation.   Baseline 27/30   Time 6   Period Weeks   Status Partially Met   Target Date 07/26/17     PT LONG TERM GOAL #4   Title Patient will increase ABC scale score >80% to demonstrate better functional mobility and better confidence with ADLs.    Baseline 70% confident;    Time 6   Period Weeks   Status Partially Met   Target Date 07/26/17     PT LONG TERM GOAL #5   Title Patient will increase BLE gross strength to 4+/5 as to improve functional strength for independent gait, increased standing tolerance and increased ADL ability.   Time 6   Period Weeks   Status Achieved   Target Date 07/26/17     PT LONG TERM GOAL #6   Title Pt will ambulate with normal stride width to demonstrate improved gait mechanics with less compensation of balance,  for more efficient and functional mobility   Time 6   Period Weeks   Status Achieved   Target Date 07/26/17               Plan - 07/13/17  1026    Clinical Impression Statement Patient instructed in advanced dynamic balance activities. patient exhibits more dizziness with lateral head turns. However with lateral movement such as side step ups or side step overs she doesn't have any difficulty. Patient was more fatigued today which also contributed to imbalance. 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   PT Frequency 2x / week   PT Duration 6 weeks   PT Treatment/Interventions ADLs/Self Care Home Management;Biofeedback;Gait training;Stair training;Balance training;Therapeutic exercise;Therapeutic activities;Functional mobility training;Neuromuscular re-education;Patient/family education;Energy conservation;Visual/perceptual remediation/compensation   PT Home Exercise Plan cotninue as given;    Consulted and Agree with Plan of Care Patient      Patient will benefit from skilled therapeutic intervention in order to improve the following deficits and impairments:  Abnormal gait, Decreased activity tolerance, Decreased balance, Decreased endurance, Decreased mobility, Decreased strength, Dizziness, Difficulty walking, Impaired perceived functional ability, Improper body mechanics, Impaired vision/preception  Visit Diagnosis: Unsteadiness on feet  Muscle weakness (generalized)  Difficulty in walking, not elsewhere classified     Problem List Patient Active Problem List   Diagnosis Date Noted  . Dizziness 04/06/2017  . PAF (paroxysmal atrial fibrillation) ([place]) 02/03/2017  . DOE (dyspnea on exertion) 12/01/2016  . Palpitations 11/03/2016  . Acute right-sided low back pain without sciatica 08/26/2016  . Preventative health care 06/02/2016  . Advance directive discussed with patient 06/02/2016  . Achilles tendonitis 03/11/2016  . Hypertension   . History of  DVT (deep vein thrombosis)   . Type 2 diabetes, controlled, with peripheral neuropathy ([place])   . Generalized osteoarthritis of multiple sites   . GERD (gastroesophageal reflux disease)   . Hyperthyroidism     Angela Bentley PT, DPT 07/13/2017, 12:42 PM  [place] [facility] [address]  [place], [place], [address] Phone: [phone]   Fax:  [phone]  Name: Angela Bentley MRN: [id number] Date of Birth: 1941-02-21

## 2017-07-14 ENCOUNTER — Telehealth: Payer: Self-pay

## 2017-07-14 MED ORDER — METOPROLOL SUCCINATE ER 25 MG PO TB24: 25.0000 mg | ORAL_TABLET | Freq: Every day | ORAL | 3 refills | Status: DC

## 2017-07-14 NOTE — Telephone Encounter (Signed)
She is going down to 25mg  on metoprolol. Okay to send new Rx for a year---but she can cut the 50mg  tabs in half Not sure what to tell her about the xarelto---hopefully she can just pay until the new year when she is back out of the donut hole.

## 2017-07-14 NOTE — Telephone Encounter (Signed)
Pt left v/m; pt has problem with [facility]; pt thought that the metoprolol was changed from 50 mg to 25 mg. Pt request Dr [name] to send in new rx for metoprolol 25 mg to [facility] mail order. med list still has metoprolol 50 mg. Pt last seen 04/06/17. pt has xarelto that she can take but was advised by [facility] mail order that she is in donut hole. The cost of xarelto has doubled since July. Pt request cb

## 2017-07-14 NOTE — Telephone Encounter (Signed)
Spoke to pt. I sent in the metoprolol. We talked about other options but discussed how much coumadin takes a lot of time and effort.

## 2017-07-17 ENCOUNTER — Ambulatory Visit: Payer: Medicare Other | Admitting: Physical Therapy

## 2017-07-18 ENCOUNTER — Ambulatory Visit: Payer: Medicare Other

## 2017-07-18 ENCOUNTER — Ambulatory Visit: Payer: Medicare Other | Admitting: Physical Therapy

## 2017-07-18 DIAGNOSIS — G939 Disorder of brain, unspecified: Secondary | ICD-10-CM | POA: Diagnosis not present

## 2017-07-18 DIAGNOSIS — R2689 Other abnormalities of gait and mobility: Secondary | ICD-10-CM | POA: Diagnosis not present

## 2017-07-19 ENCOUNTER — Ambulatory Visit: Payer: Medicare Other

## 2017-07-20 ENCOUNTER — Other Ambulatory Visit: Payer: Self-pay

## 2017-07-20 ENCOUNTER — Ambulatory Visit: Payer: Medicare Other | Admitting: Physical Therapy

## 2017-07-20 ENCOUNTER — Encounter: Payer: Self-pay | Admitting: Physical Therapy

## 2017-07-20 DIAGNOSIS — R2681 Unsteadiness on feet: Secondary | ICD-10-CM

## 2017-07-20 DIAGNOSIS — M6281 Muscle weakness (generalized): Secondary | ICD-10-CM | POA: Diagnosis not present

## 2017-07-20 DIAGNOSIS — R262 Difficulty in walking, not elsewhere classified: Secondary | ICD-10-CM | POA: Diagnosis not present

## 2017-07-20 NOTE — Telephone Encounter (Signed)
Pharmacy faxed request for metroprolol refill on 07/13/17.  R/x approved for metroprolol and confirmed receipt received on 07/14/17.

## 2017-07-20 NOTE — Therapy (Signed)
[place] MAIN [facility] SERVICES [address] [place], [place], [address] Phone: [phone]   Fax:  [phone]  Physical Therapy Treatment  Patient Details  Name: Angela Bentley MRN: [id number] Date of Birth: [birth year] Referring Provider: Dr. Lacinda Axon  Encounter Date: 07/20/2017      PT End of Session - 07/20/17 1307    Visit Number 9   Number of Visits 13   Date for PT Re-Evaluation 07/26/17   Authorization Type G code 9   Authorization Time Period 10   PT Start Time 1300   PT Stop Time 1345   PT Time Calculation (min) 45 min   Equipment Utilized During Treatment Gait belt   Activity Tolerance Patient tolerated treatment well   Behavior During Therapy [facility] for tasks assessed/performed      Past Medical History:  Diagnosis Date  . Generalized osteoarthritis of multiple sites   . GERD (gastroesophageal reflux disease)   . History of DVT (deep vein thrombosis) 2016   estrogen and airflights. Rx with xarelto for 3 months  . Hypertension   . Hyperthyroidism    treated briefly in college  . Insomnia   . Retinal tear of left eye   . Rosacea   . Type 2 diabetes, controlled, with peripheral neuropathy (HCC)    Last A1C was a near 7.1  . Uterine fibroid     Past Surgical History:  Procedure Laterality Date  . CARPAL TUNNEL RELEASE Right    Dr Burney Gauze  . CATARACT EXTRACTION    . RETINAL TEAR REPAIR CRYOTHERAPY     10/09/13, then again 3/15    There were no vitals filed for this visit.      Subjective Assessment - 07/20/17 1306    Subjective patient reports feeling more unsteady today; She reports sleeping well last night; She is still dealing with the cough but states that it is better at night;    Pertinent History 76 y/o female with self-reported decrease in balance over the last 2 months following sudden onset of dizziness and ataxia on 04/01/17. Pt was initially diagnosed with [facility] before follow-up  with neurology. Pt has had a recent dx of Left ventricular brain tumor. Per office visit with neurologist on 8/28, "I cannot relate balance deficits completely to this mass although there is a possibility it is contributing". Recent CT scan showed no evidence of primarily malignancy elsewhere. Currently tumor is being monitored with follow-up MRI planned for 2-3 months from 05/23/17. Pt's current balance deficits are worse with first steps fllowing static standing or sitting, though pt reports no imbalance on initail standing. Pt has medical Hx significant for a-fib, DM2, and HTN. No Hx of cancer. Stress test on 12/15/16 indicated that pt was hypertensive before during and after stress test. Blood pressure response was appropriate with exaggerated heart rate response to exercise noted. Pt feels nauseated with water aerobics. Pt reports low visual acuity on R side that has not changed since birth. Pt reports she has ocular migraines that intermittently cause her to see auras on the L   Limitations Walking;House hold activities;Lifting   How long can you sit comfortably? Arthritis related pain with prolonged sitting.   How long can you stand comfortably? Pt reports no balance issues in static stance   How long can you walk comfortably? Self reported balance decifits walking that do not get worse with time   Diagnostic tests MRI brain with and without contrast: There is a  2.5-3 cm enhancing mass situated within the atrium of the left ventricle. There is enlargement of the entire ventricular system out of proportion to the location of this mass. There is global atrophy noted within the brain. There are no other enhancing masses seen.    Patient Stated Goals To improve balance   Currently in Pain? No/denies        TREATMENT: Warm up on Nustep BUE/BLE level 2x4 min (Unbilled);  Resisted gait, 17.5# (2 way)forward/backward (lateral head turns), side/side (vertical head turns)x2 reps each direction with  head turns to challenge dynamic balance, with min A for safety and cues to slow down eccentric return for better safety;  Gait in hallway: Forward walking with lateral head turns, calling out various objects to challenge cognition and balance x200 feet;  Tandem gait on airex beam x6 laps unsupported with cues to keep head erect for better balance challenge; Requires CGA for safety; Side stepping on airex beam x3 laps each direction with head turns up/down, requiring close supervision and cues to slow down for better safety;  Tandem stance on airex beam: BUE ball pass side/side x3 each foot in front, x3 sets each with min A for safety and cues to keep both hands on ball and reduce trunk rotation for better challenge/safety;  Gait in gym, forward with diagonal head turns (up right, down left) or (up left, down right) x120 feet with close supervision for safety;  Reports increased fatigue at end of session;                      PT Education - 07/20/17 1307    Education provided Yes   Education Details balance, HEP reinforced;    Person(s) Educated Patient   Methods Explanation;Demonstration;Verbal cues   Comprehension Verbalized understanding;Returned demonstration;Verbal cues required;Need further instruction          PT Short Term Goals - 07/11/17 0939      PT SHORT TERM GOAL #1   Title  Patient will continue to deny any falls over past 4 weeks to demonstrate successful compensatory strategies and continued safety awareness at home and in community.    Baseline no falls;    Time 4   Period Weeks   Status Achieved     PT SHORT TERM GOAL #2   Title Pt will perform HEP at least 3/7 days/wk in order to show meaningful improvement in strength and balance for improved functional mobility and safety.   Baseline very compliant;    Time 4   Period Weeks   Status Achieved           PT Long Term Goals - 07/11/17 0940      PT LONG TERM GOAL #1   Title Patient  will be independent in home exercise program to improve strength/mobility for better functional independence with ADLs.   Time 6   Period Weeks   Status Partially Met   Target Date 07/26/17     PT LONG TERM GOAL #2   Title Patient will complete five times sit to stand test in < 12 seconds indicating an increased LE strength and improved balance in older adult.   Baseline 14 sec without HHA   Time 6   Period Weeks   Status Partially Met   Target Date 07/26/17     PT LONG TERM GOAL #3   Title Patient will increase Functional Gait Assessment score to >28/30 as to reduce fall risk and improve dynamic gait safety  with community ambulation.   Baseline 27/30   Time 6   Period Weeks   Status Partially Met   Target Date 07/26/17     PT LONG TERM GOAL #4   Title Patient will increase ABC scale score >80% to demonstrate better functional mobility and better confidence with ADLs.    Baseline 70% confident;    Time 6   Period Weeks   Status Partially Met   Target Date 07/26/17     PT LONG TERM GOAL #5   Title Patient will increase BLE gross strength to 4+/5 as to improve functional strength for independent gait, increased standing tolerance and increased ADL ability.   Time 6   Period Weeks   Status Achieved   Target Date 07/26/17     PT LONG TERM GOAL #6   Title Pt will ambulate with normal stride width to demonstrate improved gait mechanics with less compensation of balance,  for more efficient and functional mobility   Time 6   Period Weeks   Status Achieved   Target Date 07/26/17               Plan - 07/20/17 1345    Clinical Impression Statement Instructed patient in advanced dynamic balance exercise. She continues to have a little unsteadiness with lateral head turns. Progressed balance exercise with diagonal head turns. Patient was really challenged with this. Advanced HEP with standing feet together diagonal head turns to improve stance control. 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   PT Frequency 2x / week   PT Duration 6 weeks   PT Treatment/Interventions ADLs/Self Care Home Management;Biofeedback;Gait training;Stair training;Balance training;Therapeutic exercise;Therapeutic activities;Functional mobility training;Neuromuscular re-education;Patient/family education;Energy conservation;Visual/perceptual remediation/compensation   PT Home Exercise Plan cotninue as given;    Consulted and Agree with Plan of Care Patient      Patient will benefit from skilled therapeutic intervention in order to improve the following deficits and impairments:  Abnormal gait, Decreased activity tolerance, Decreased balance, Decreased endurance, Decreased mobility, Decreased strength, Dizziness, Difficulty walking, Impaired perceived functional ability, Improper body mechanics, Impaired vision/preception  Visit Diagnosis: Unsteadiness on feet  Muscle weakness (generalized)  Difficulty in walking, not elsewhere classified     Problem List Patient Active Problem List   Diagnosis Date Noted  . Dizziness 04/06/2017  . PAF (paroxysmal atrial fibrillation) ([place]) 02/03/2017  . DOE (dyspnea on exertion) 12/01/2016  . Palpitations 11/03/2016  . Acute right-sided low back pain without sciatica 08/26/2016  . Preventative health care 06/02/2016  . Advance directive discussed with patient 06/02/2016  . Achilles tendonitis 03/11/2016  . Hypertension   . History of DVT (deep vein thrombosis)   . Type 2 diabetes, controlled, with peripheral neuropathy ([place])   . Generalized osteoarthritis of multiple sites   . GERD (gastroesophageal reflux disease)   . Hyperthyroidism     Sumaya Riedesel PT, DPT 07/20/2017, 1:46 PM  [place] MAIN [facility] SERVICES [address]  [place], [place], [address] Phone: [phone]   Fax:  [phone]  Name: Phynix Horton MRN: [id number] Date of Birth: 1941-08-02

## 2017-07-24 ENCOUNTER — Encounter: Payer: Self-pay | Admitting: Physical Therapy

## 2017-07-24 ENCOUNTER — Ambulatory Visit: Payer: Medicare Other | Admitting: Physical Therapy

## 2017-07-24 DIAGNOSIS — R2681 Unsteadiness on feet: Secondary | ICD-10-CM

## 2017-07-24 DIAGNOSIS — M6281 Muscle weakness (generalized): Secondary | ICD-10-CM | POA: Diagnosis not present

## 2017-07-24 DIAGNOSIS — R262 Difficulty in walking, not elsewhere classified: Secondary | ICD-10-CM

## 2017-07-24 NOTE — Therapy (Signed)
[place] MAIN [facility] SERVICES [address] [place], [place], [address] Phone: [phone]   Fax:  [phone]  Physical Therapy Treatment  Patient Details  Name: Angela Bentley MRN: [id number] Date of Birth: Aug 19, 1941 Referring Provider: Dr. Lacinda Axon  Encounter Date: 07/24/2017      PT End of Session - 07/24/17 1111    Visit Number 10   Number of Visits 17   Date for PT Re-Evaluation 08/21/17   Authorization Type G code 10   Authorization Time Period 10   PT Start Time 1102   PT Stop Time 1145   PT Time Calculation (min) 43 min   Equipment Utilized During Treatment Gait belt   Activity Tolerance Patient tolerated treatment well   Behavior During Therapy [facility] for tasks assessed/performed      Past Medical History:  Diagnosis Date  . Generalized osteoarthritis of multiple sites   . GERD (gastroesophageal reflux disease)   . History of DVT (deep vein thrombosis) 2016   estrogen and airflights. Rx with xarelto for 3 months  . Hypertension   . Hyperthyroidism    treated briefly in college  . Insomnia   . Retinal tear of left eye   . Rosacea   . Type 2 diabetes, controlled, with peripheral neuropathy (HCC)    Last A1C was a near 7.1  . Uterine fibroid     Past Surgical History:  Procedure Laterality Date  . CARPAL TUNNEL RELEASE Right    Dr Burney Gauze  . CATARACT EXTRACTION    . RETINAL TEAR REPAIR CRYOTHERAPY     10/09/13, then again 3/15    There were no vitals filed for this visit.      Subjective Assessment - 07/24/17 1106    Subjective Patient reports feeling well and having a good weekend; She denies any pain; She reports still feeling dizzy at times; She reports, "I feel mentally foggy."    Pertinent History 76 y/o female with self-reported decrease in balance over the last 2 months following sudden onset of dizziness and ataxia on 04/01/17. Pt was initially diagnosed with dehydration at [facility] clinic before follow-up  with neurology. Pt has had a recent dx of Left ventricular brain tumor. Per office visit with neurologist on 8/28, "I cannot relate balance deficits completely to this mass although there is a possibility it is contributing". Recent CT scan showed no evidence of primarily malignancy elsewhere. Currently tumor is being monitored with follow-up MRI planned for 2-3 months from 05/23/17. Pt's current balance deficits are worse with first steps fllowing static standing or sitting, though pt reports no imbalance on initail standing. Pt has medical Hx significant for a-fib, DM2, and HTN. No Hx of cancer. Stress test on 12/15/16 indicated that pt was hypertensive before during and after stress test. Blood pressure response was appropriate with exaggerated heart rate response to exercise noted. Pt feels nauseated with water aerobics. Pt reports low visual acuity on R side that has not changed since birth. Pt reports she has ocular migraines that intermittently cause her to see auras on the L   Limitations Walking;House hold activities;Lifting   How long can you sit comfortably? Arthritis related pain with prolonged sitting.   How long can you stand comfortably? Pt reports no balance issues in static stance   How long can you walk comfortably? Self reported balance decifits walking that do not get worse with time   Diagnostic tests MRI brain with and without contrast: There is a 2.5-3  cm enhancing mass situated within the atrium of the left ventricle. There is enlargement of the entire ventricular system out of proportion to the location of this mass. There is global atrophy noted within the brain. There are no other enhancing masses seen.    Patient Stated Goals To improve balance   Currently in Pain? No/denies            [facility] PT Assessment - 07/24/17 0001      Standardized Balance Assessment   Five times sit to stand comments  12 sec without HHA (low fall risk, improved from 07/11/17 which was 14 sec)      Functional Gait  Assessment   Gait Level Surface Walks 20 ft in less than 5.5 sec, no assistive devices, good speed, no evidence for imbalance, normal gait pattern, deviates no more than 6 in outside of the 12 in walkway width.   Change in Gait Speed Able to smoothly change walking speed without loss of balance or gait deviation. Deviate no more than 6 in outside of the 12 in walkway width.   Gait with Horizontal Head Turns Performs head turns smoothly with no change in gait. Deviates no more than 6 in outside 12 in walkway width   Gait with Vertical Head Turns Performs task with slight change in gait velocity (eg, minor disruption to smooth gait path), deviates 6 - 10 in outside 12 in walkway width or uses assistive device   Gait and Pivot Turn Pivot turns safely within 3 sec and stops quickly with no loss of balance.   Step Over Obstacle Is able to step over 2 stacked shoe boxes taped together (9 in total height) without changing gait speed. No evidence of imbalance.   Gait with Narrow Base of Support Ambulates 7-9 steps.   Gait with Eyes Closed Walks 20 ft, uses assistive device, slower speed, mild gait deviations, deviates 6-10 in outside 12 in walkway width. Ambulates 20 ft in less than 9 sec but greater than 7 sec.   Ambulating Backwards Walks 20 ft, no assistive devices, good speed, no evidence for imbalance, normal gait   Steps Alternating feet, no rail.   Total Score 27       TREATMENT: Warm up on Nustep BUE/BLE level 2x4 min (Unbilled);  Instructed patient in 5 times sit<>Stand and functional gait assessment to address goals; see above. Patient does require min Vcs for correct activity technique; FGA- 27/30- slight risk for falls, no change from 07/11/17 which was 27/30   Resisted gait, 17.5# (2 way)forward/backward (lateral head turns), side/side (vertical head turns)x2 reps each direction with head turns to challenge dynamic balance, with min A for safety and cues to slow down  eccentric return for better safety;  Gait in hallway: Forward walking with lateral head turns, calling out various objects to challenge cognition and balance x200 feet; Patient exhibits less veering side/side but does exhibit slight foot drag on left side during lateral head turns; Requires cues to increase ankle DF at heel strike for less foot drop;   Reinforced HEP;                        PT Education - 07/24/17 1111    Education provided Yes   Education Details balance, HEP reinforced;    Person(s) Educated Patient   Methods Explanation;Demonstration;Verbal cues   Comprehension Verbalized understanding;Returned demonstration;Verbal cues required;Need further instruction          PT Short Term Goals -  07/11/17 0939      PT SHORT TERM GOAL #1   Title  Patient will continue to deny any falls over past 4 weeks to demonstrate successful compensatory strategies and continued safety awareness at home and in community.    Baseline no falls;    Time 4   Period Weeks   Status Achieved     PT SHORT TERM GOAL #2   Title Pt will perform HEP at least 3/7 days/wk in order to show meaningful improvement in strength and balance for improved functional mobility and safety.   Baseline very compliant;    Time 4   Period Weeks   Status Achieved           PT Long Term Goals - 07/24/17 1112      PT LONG TERM GOAL #1   Title Patient will be independent in home exercise program to improve strength/mobility for better functional independence with ADLs.   Time 4   Period Weeks   Status Partially Met   Target Date 08/21/17     PT LONG TERM GOAL #2   Title Patient will complete five times sit to stand test in < 12 seconds indicating an increased LE strength and improved balance in older adult.   Baseline 14 sec without HHA; 07/24/17- 12 sec   Time 4   Period Weeks   Status Achieved   Target Date 08/21/17     PT LONG TERM GOAL #3   Title Patient will increase  Functional Gait Assessment score to >28/30 as to reduce fall risk and improve dynamic gait safety with community ambulation.   Baseline 27/30   Time 4   Period Weeks   Status Partially Met   Target Date 08/21/17     PT LONG TERM GOAL #4   Title Patient will increase ABC scale score >80% to demonstrate better functional mobility and better confidence with ADLs.    Baseline 70% confident;    Time 4   Period Weeks   Status Partially Met   Target Date 08/21/17     PT LONG TERM GOAL #5   Title Patient will increase BLE gross strength to 4+/5 as to improve functional strength for independent gait, increased standing tolerance and increased ADL ability.   Time 4   Period Weeks   Status Achieved     PT LONG TERM GOAL #6   Title Pt will ambulate with normal stride width to demonstrate improved gait mechanics with less compensation of balance,  for more efficient and functional mobility   Time 4   Period Weeks   Status Achieved               Plan - 07/24/17 1300    Clinical Impression Statement Patient instructed in outcome measures to assess goals. She has made significant progress towards all goals. Patient still has some unsteadiness during dynamic gait activities but tests as a lower fall risk. Concerned that continued tinnitus is contributing to imbalance. Patient instructed in balance exercise today. She exhibited less unsteadiness with lateral head turns as compared to previous visits.  She would benefit from additional skilled PT intervention to improve dynamic balance and reduce unsteadiness with tasks.    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   PT Frequency 1x / week   PT Duration 4 weeks   PT Treatment/Interventions ADLs/Self Care Home Management;Biofeedback;Gait training;Stair training;Balance training;Therapeutic exercise;Therapeutic activities;Functional mobility training;Neuromuscular  re-education;Patient/family education;Energy conservation;Visual/perceptual  remediation/compensation   PT Home Exercise Plan cotninue as given;    Consulted and Agree with Plan of Care Patient      Patient will benefit from skilled therapeutic intervention in order to improve the following deficits and impairments:  Abnormal gait, Decreased activity tolerance, Decreased balance, Decreased endurance, Decreased mobility, Decreased strength, Dizziness, Difficulty walking, Impaired perceived functional ability, Improper body mechanics, Impaired vision/preception  Visit Diagnosis: Unsteadiness on feet - Plan: PT plan of care cert/re-cert  Muscle weakness (generalized) - Plan: PT plan of care cert/re-cert  Difficulty in walking, not elsewhere classified - Plan: PT plan of care cert/re-cert       G-Codes - 08-23-2017 1303    Functional Assessment Tool Used (Outpatient Only) FGA,  5 times sit<>stand, 10 meter walk, clinical judgement;    Functional Limitation Mobility: Walking and moving around   Mobility: Walking and Moving Around Current Status [phone]) At least 20 percent but less than 40 percent impaired, limited or restricted   Mobility: Walking and Moving Around Goal Status [phone]) At least 1 percent but less than 20 percent impaired, limited or restricted      Problem List Patient Active Problem List   Diagnosis Date Noted  . Dizziness 04/06/2017  . PAF (paroxysmal atrial fibrillation) ([place]) 02/03/2017  . DOE (dyspnea on exertion) 12/01/2016  . Palpitations 11/03/2016  . Acute right-sided low back pain without sciatica 08/26/2016  . Preventative health care 06/02/2016  . Advance directive discussed with patient 06/02/2016  . Achilles tendonitis 03/11/2016  . Hypertension   . History of DVT (deep vein thrombosis)   . Type 2 diabetes, controlled, with peripheral neuropathy ([place])   . Generalized osteoarthritis of multiple sites   . GERD (gastroesophageal reflux disease)   .  Hyperthyroidism     Kyeshia Zinn PT, DPT 2017-08-23, 1:05 PM  [place] MAIN [facility] SERVICES [address] [place], [place], [address] Phone: [phone]   Fax:  [phone]  Name: Mellany Dinsmore MRN: [id number] Date of Birth: 1941/05/30

## 2017-07-25 ENCOUNTER — Ambulatory Visit: Payer: Medicare Other | Admitting: Physical Therapy

## 2017-07-27 ENCOUNTER — Ambulatory Visit: Payer: Medicare Other | Attending: Neurosurgery | Admitting: Physical Therapy

## 2017-07-27 ENCOUNTER — Encounter: Payer: Self-pay | Admitting: Physical Therapy

## 2017-07-27 DIAGNOSIS — R2681 Unsteadiness on feet: Secondary | ICD-10-CM | POA: Diagnosis not present

## 2017-07-27 DIAGNOSIS — R262 Difficulty in walking, not elsewhere classified: Secondary | ICD-10-CM | POA: Insufficient documentation

## 2017-07-27 DIAGNOSIS — M6281 Muscle weakness (generalized): Secondary | ICD-10-CM

## 2017-07-27 NOTE — Patient Instructions (Addendum)
Backward Walking   Walk backward, toes of each foot coming down first. Take long, even strides. Make sure you have a clear pathway with no obstructions when you do this. Stand beside counter and walk backward  And then walk forward doing opposite directions; repeat 10 laps 2x a day at least 5 days a week.   Repeat 5 reps with each foot in front 5 days a week.Balance: Unilateral   Attempt to balance on left leg, eyes open. Hold _5-10___ seconds.Start with holding onto counter and if you get your balance you can try to let go of counter. Repeat __5__ times per set. Do __1__ sets per session. Do __1__ sessions per day. Keep eyes open:   http://orth.exer.us/29   Copyright  VHI. All rights reserved.     Tandem Walking    Walk with each foot directly in front of other, heel of one foot touching toes of other foot with each step. Both feet straight ahead. Stand beside counter, try not to hold on unless needed, repeat 3-5 laps with trying to keep heel to toe;    Copyright  VHI. All rights reserved.  Walking Head Turn    Standing close to a wall, walk __30-50__ feet while turning head side to side. Touch wall if necessary to keep balance. Repeat _2-3___ times. Do _2___ sessions per day. Repeat with vertical head turns;  http://gt2.exer.us/536   Copyright  VHI. All rights reserved.

## 2017-07-27 NOTE — Therapy (Signed)
[place] MAIN [facility] SERVICES [address] [place], [place], [address] Phone: [phone]   Fax:  [phone]  Physical Therapy Treatment  Patient Details  Name: Angela Bentley MRN: [id number] Date of Birth: Apr 27, 1941 Referring Provider: Dr. Lacinda Axon  Encounter Date: 07/27/2017      PT End of Session - 07/27/17 1310    Visit Number 11   Number of Visits 17   Date for PT Re-Evaluation 08/21/17   Authorization Type G code 1   Authorization Time Period 10   PT Start Time 1302   PT Stop Time 1345   PT Time Calculation (min) 43 min   Equipment Utilized During Treatment Gait belt   Activity Tolerance Patient tolerated treatment well   Behavior During Therapy WFL for tasks assessed/performed      Past Medical History:  Diagnosis Date  . Generalized osteoarthritis of multiple sites   . GERD (gastroesophageal reflux disease)   . History of DVT (deep vein thrombosis) 2016   estrogen and airflights. Rx with xarelto for 3 months  . Hypertension   . Hyperthyroidism    treated briefly in college  . Insomnia   . Retinal tear of left eye   . Rosacea   . Type 2 diabetes, controlled, with peripheral neuropathy (HCC)    Last A1C was a near 7.1  . Uterine fibroid     Past Surgical History:  Procedure Laterality Date  . CARPAL TUNNEL RELEASE Right    Dr Burney Gauze  . CATARACT EXTRACTION    . RETINAL TEAR REPAIR CRYOTHERAPY     10/09/13, then again 3/15    There were no vitals filed for this visit.      Subjective Assessment - 07/27/17 1309    Subjective Patient reports still having some dizziness today; She reports no new changes; Reports that her husband will be starting cardiac rehab 3x a week and she is the only one who drives;    Pertinent History 76 y/o female with self-reported decrease in balance over the last 2 months following sudden onset of dizziness and ataxia on 04/01/17. Pt was [facility] diagnosed with [facility] at [facility] before follow-up with neurology. Pt has had a recent dx of Left ventricular brain tumor. Per office visit with neurologist on 8/28, "I cannot relate balance deficits completely to this mass although there is a possibility it is contributing". Recent CT scan showed no evidence of primarily malignancy elsewhere. Currently tumor is being monitored with follow-up MRI planned for 2-3 months from 05/23/17. Pt's current balance deficits are worse with first steps fllowing static standing or sitting, though pt reports no imbalance on initail standing. Pt has medical Hx significant for a-fib, DM2, and HTN. No Hx of cancer. Stress test on 12/15/16 indicated that pt was hypertensive before during and after stress test. Blood pressure response was appropriate with exaggerated heart rate response to exercise noted. Pt feels nauseated with water aerobics. Pt reports low visual acuity on R side that has not changed since birth. Pt reports she has ocular migraines that intermittently cause her to see auras on the L   Limitations Walking;House hold activities;Lifting   How long can you sit comfortably? Arthritis related pain with prolonged sitting.   How long can you stand comfortably? Pt reports no balance issues in static stance   How long can you walk comfortably? Self reported balance decifits walking that do not get worse with time   Diagnostic tests MRI brain with and  without contrast: There is a 2.5-3 cm enhancing mass situated within the atrium of the left ventricle. There is enlargement of the entire ventricular system out of proportion to the location of this mass. There is global atrophy noted within the brain. There are no other enhancing masses seen.    Patient Stated Goals To improve balance   Currently in Pain? No/denies          TREATMENT: Warm up on Nustep BUE/BLE level 3x4 min (Unbilled);  Advanced HEP: Gait in hallway, side/side head turns and up/down head turns x100 feet each; Standing  beside counter, forward/backward walking x10 feet x3 laps Forward tandem gait beside counter x10 feet x3 laps unsupported; Patient required min VCs for balance stability, including to increase trunk control for less loss of balance with smaller base of support; Patient instructed to work on reducing foot drag when walking in hallway with head turns;   SLS on firm surface 10 sec hold x2 each LE with cues for positioning and to increase upper trunk support for better stance control;  Standing in corner/away from wall: Eyes closed with BUE arms overhead lift x10 reps; Required cues to reduce sway with UE movement by slowing down UE movement;   Tandem gait on airex beam x6 laps unsupported with cues to keep head erect for better balance challenge; Requires close supervision for safety; Side stepping on airex beam x3 laps each direction with head up/down with cues for gaze stabilization and to slow down steps for better stance control Tandem stance on airex beam with BUE ball pass side/side x5 reps each foot in front, x2 sets each foot in front  Side step on narrow beam with heel/toe off to challenge ankle strength/stabilization x2 laps each direction with cues to keep foot on floor for better ankle strengthening;   Reports increased fatigue at end of session;                          PT Education - 07/27/17 1310    Education provided Yes   Education Details balance exercise, HEP reinforced;    Person(s) Educated Patient   Methods Explanation;Demonstration;Verbal cues   Comprehension Verbalized understanding;Returned demonstration;Verbal cues required;Need further instruction          PT Short Term Goals - 07/11/17 0939      PT SHORT TERM GOAL #1   Title  Patient will continue to deny any falls over past 4 weeks to demonstrate successful compensatory strategies and continued safety awareness at home and in community.    Baseline no falls;    Time 4   Period  Weeks   Status Achieved     PT SHORT TERM GOAL #2   Title Pt will perform HEP at least 3/7 days/wk in order to show meaningful improvement in strength and balance for improved functional mobility and safety.   Baseline very compliant;    Time 4   Period Weeks   Status Achieved           PT Long Term Goals - 07/24/17 1112      PT LONG TERM GOAL #1   Title Patient will be independent in home exercise program to improve strength/mobility for better functional independence with ADLs.   Time 4   Period Weeks   Status Partially Met   Target Date 08/21/17     PT LONG TERM GOAL #2   Title Patient will complete five times sit to stand test  in < 12 seconds indicating an increased LE strength and improved balance in older adult.   Baseline 14 sec without HHA; 07/24/17- 12 sec   Time 4   Period Weeks   Status Achieved   Target Date 08/21/17     PT LONG TERM GOAL #3   Title Patient will increase Functional Gait Assessment score to >28/30 as to reduce fall risk and improve dynamic gait safety with community ambulation.   Baseline 27/30   Time 4   Period Weeks   Status Partially Met   Target Date 08/21/17     PT LONG TERM GOAL #4   Title Patient will increase ABC scale score >80% to demonstrate better functional mobility and better confidence with ADLs.    Baseline 70% confident;    Time 4   Period Weeks   Status Partially Met   Target Date 08/21/17     PT LONG TERM GOAL #5   Title Patient will increase BLE gross strength to 4+/5 as to improve functional strength for independent gait, increased standing tolerance and increased ADL ability.   Time 4   Period Weeks   Status Achieved     PT LONG TERM GOAL #6   Title Pt will ambulate with normal stride width to demonstrate improved gait mechanics with less compensation of balance,  for more efficient and functional mobility   Time 4   Period Weeks   Status Achieved               Plan - 07/27/17 1346    Clinical  Impression Statement Patient instructed in advanced balance exercise. Advanced HEP with SLS and other dynamic balance tasks. Reinforced HEP as patient will be decreasing to 1x a week to improve adherence and compliance. Patient verbalized understanding. Patient continues to have difficulty with dual task balance exercise. 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   PT Frequency 1x / week   PT Duration 4 weeks   PT Treatment/Interventions ADLs/Self Care Home Management;Biofeedback;Gait training;Stair training;Balance training;Therapeutic exercise;Therapeutic activities;Functional mobility training;Neuromuscular re-education;Patient/family education;Energy conservation;Visual/perceptual remediation/compensation   PT Home Exercise Plan cotninue as given;    Consulted and Agree with Plan of Care Patient      Patient will benefit from skilled therapeutic intervention in order to improve the following deficits and impairments:  Abnormal gait, Decreased activity tolerance, Decreased balance, Decreased endurance, Decreased mobility, Decreased strength, Dizziness, Difficulty walking, Impaired perceived functional ability, Improper body mechanics, Impaired vision/preception  Visit Diagnosis: Unsteadiness on feet  Muscle weakness (generalized)  Difficulty in walking, not elsewhere classified     Problem List Patient Active Problem List   Diagnosis Date Noted  . Dizziness 04/06/2017  . PAF (paroxysmal atrial fibrillation) ([place]) 02/03/2017  . DOE (dyspnea on exertion) 12/01/2016  . Palpitations 11/03/2016  . Acute right-sided low back pain without sciatica 08/26/2016  . Preventative health care 06/02/2016  . Advance directive discussed with patient 06/02/2016  . Achilles tendonitis 03/11/2016  . Hypertension   . History of DVT  (deep vein thrombosis)   . Type 2 diabetes, controlled, with peripheral neuropathy ([place])   . Generalized osteoarthritis of multiple sites   . GERD (gastroesophageal reflux disease)   . Hyperthyroidism     Tamani Durney PT, DPT 07/27/2017, 2:31 PM  [place] MAIN [facility] SERVICES [place]  [place], [place], [address] Phone: [phone]   Fax:  [phone]  Name: Frederika Hukill MRN: [id number] Date of Birth: [birth year]

## 2017-07-29 ENCOUNTER — Encounter: Payer: Self-pay | Admitting: Cardiovascular Disease

## 2017-07-30 NOTE — Progress Notes (Deleted)
Cardiology Office Note  Date:  07/30/2017   ID:  Angela Bentley, DOB [birth year], MRN [id number]  PCP:  Angela Carbon, MD   No chief complaint on file.   HPI:  Angela Bentley is a 76 y.o. female with a PMH  PAF hyperthyroidism (while in college),  diabetes type 2 with peripheral neuropathy,  hypertension  DVT.   palpitations    Cardiac event monitor  one episode  atrial fibrillation rate 154 bpm  GXT: hypertensive before during and after the test.  Exaggerated heart rate response to exercise   PMH:   has a past medical history of Generalized osteoarthritis of multiple sites, GERD (gastroesophageal reflux disease), History of DVT (deep vein thrombosis) (2016), Hypertension, Hyperthyroidism, Insomnia, Retinal tear of left eye, Rosacea, Type 2 diabetes, controlled, with peripheral neuropathy ([place]), and Uterine fibroid.  PSH:    Past Surgical History:  Procedure Laterality Date  . CARPAL TUNNEL RELEASE Right    Dr Burney Gauze  . CATARACT EXTRACTION    . RETINAL TEAR REPAIR CRYOTHERAPY     10/09/13, then again 3/15    Current Outpatient Medications  Medication Sig Dispense Refill  . Ascorbic Acid (VITAMIN C) 100 MG CHEW Chew 200 mg by mouth daily as needed (immune system support).    . celecoxib (CELEBREX) 200 MG capsule Take 1 capsule (200 mg total) by mouth as needed for mild pain or moderate pain. 30 capsule 1  . fluticasone (VERAMYST) 27.5 MCG/SPRAY nasal spray Place 2 sprays into the nose daily as needed for allergies.     Marland Kitchen glimepiride (AMARYL) 1 MG tablet TAKE 1 TABLET EVERY DAY WITH BREAKFAST 90 tablet 3  . guaiFENesin-codeine (ROBITUSSIN AC) 100-10 MG/5ML syrup Take 5 mLs by mouth 3 (three) times daily as needed for cough. 120 mL 0  . HYDROcodone-homatropine (HYCODAN) 5-1.5 MG/5ML syrup Take 5 mLs by mouth at bedtime as needed for cough. 60 mL 0  . losartan-hydrochlorothiazide (HYZAAR) 100-12.5 MG tablet Take 1 tablet by mouth daily. 90 tablet 3  . meclizine  (ANTIVERT) 25 MG tablet Take 1 tablet (25 mg total) by mouth 3 (three) times daily as needed for dizziness. 90 tablet 1  . metoprolol succinate (TOPROL-XL) 25 MG 24 hr tablet Take 1 tablet (25 mg total) by mouth daily. 90 tablet 3  . Multiple Vitamin (MULTIVITAMIN) tablet Take 1 tablet by mouth daily.    . pioglitazone (ACTOS) 45 MG tablet Take 1 tablet (45 mg total) by mouth daily. 90 tablet 3  . rivaroxaban (XARELTO) 20 MG TABS tablet Take 1 tablet (20 mg total) by mouth daily with supper. 90 tablet 3   No current facility-administered medications for this visit.      Allergies:   Penicillins; Sulfa antibiotics; and Azithromycin   Social History:  The patient  reports that she quit smoking about 23 years ago. she has never used smokeless tobacco. She reports that she drinks about 0.6 oz of alcohol per week. She reports that she does not use drugs.   Family History:   family history includes Diabetes in her father and other; Pancreatic cancer in her father.    Review of Systems: ROS   PHYSICAL EXAM: VS:  LMP 09/26/1997  , BMI There is no height or weight on file to calculate BMI. GEN: Well nourished, well developed, in no acute distress HEENT: normal Neck: no JVD, carotid bruits, or masses Cardiac: RRR; no murmurs, rubs, or gallops,no edema  Respiratory:  clear to auscultation bilaterally, normal work of breathing  GI: soft, nontender, nondistended, + BS MS: no deformity or atrophy Skin: warm and dry, no rash Neuro:  Strength and sensation are intact Psych: euthymic mood, full affect    Recent Labs: 10/31/2016: BUN 15; Creatinine, Ser 0.82; Hemoglobin 13.0; Platelets 224; Potassium 3.7; Sodium 139    Lipid Panel Lab Results  Component Value Date   CHOL 192 06/02/2016   HDL 52.80 06/02/2016   LDLCALC 106 (H) 06/02/2016   TRIG 166.0 (H) 06/02/2016      Wt Readings from Last 3 Encounters:  04/06/17 203 lb (92.1 kg)  02/03/17 207 lb (93.9 kg)  11/29/16 205 lb (93 kg)        ASSESSMENT AND PLAN:  No diagnosis found.   Disposition:   F/U  6 months  No orders of the defined types were placed in this encounter.    Signed, Esmond Plants, M.D., Ph.D. 07/30/2017  [place], [place]

## 2017-08-01 ENCOUNTER — Encounter: Payer: Self-pay | Admitting: Physical Therapy

## 2017-08-02 ENCOUNTER — Ambulatory Visit: Payer: Medicare Other | Admitting: Physical Therapy

## 2017-08-03 ENCOUNTER — Encounter: Payer: Self-pay | Admitting: Cardiovascular Disease

## 2017-08-03 ENCOUNTER — Ambulatory Visit: Payer: Medicare Other | Admitting: Cardiovascular Disease

## 2017-08-08 ENCOUNTER — Encounter: Payer: Self-pay | Admitting: Physical Therapy

## 2017-08-08 ENCOUNTER — Telehealth: Payer: Self-pay

## 2017-08-08 ENCOUNTER — Ambulatory Visit: Payer: Medicare Other | Admitting: Physical Therapy

## 2017-08-08 DIAGNOSIS — R262 Difficulty in walking, not elsewhere classified: Secondary | ICD-10-CM

## 2017-08-08 DIAGNOSIS — R2681 Unsteadiness on feet: Secondary | ICD-10-CM

## 2017-08-08 DIAGNOSIS — E1142 Type 2 diabetes mellitus with diabetic polyneuropathy: Secondary | ICD-10-CM

## 2017-08-08 DIAGNOSIS — M6281 Muscle weakness (generalized): Secondary | ICD-10-CM

## 2017-08-08 NOTE — Telephone Encounter (Signed)
Copied from [place]. Topic: Appointment Scheduling - Scheduling Inquiry for Clinic >> Aug 08, 2017 12:24 PM Hewitt Shorts wrote: Reason for CRM: pt reuested lab work and visit thru my chart and they are wanting to do an A1C which is over due and also the kidney profile that was requested for dr Rockey Situ to do and needs to know if he needs an office visit and lab or both    Best number   >> Aug 08, 2017 12:40 PM Modena Nunnery, CMA wrote: Lm on pts vm requesting a call back. See mychart message with cardiology

## 2017-08-08 NOTE — Patient Instructions (Addendum)
Tandem Stance    Start with standing in corner with shoulders and arms supported, one foot in front of other and with eyes closed and turn head side/side (5-10 times) focusing on feeling supported by the wall, Progress to standing away from wall with hands supported on the wall and with eyes closed turn head side/side (5-10 times) feeling balanced in the center; Repeat with each foot in front of other;  Copyright  VHI. All rights reserved.   Bound: Lateral   Stand beside line in floor. Start with stepping one side and balance on single leg, repeat to other side (goal is to try to keep balance when stepping to side) As this gets easier, then progress to wider step with slight hop;  Repeat _5-10__ times. Repeat on other side for set. Rest _2__ seconds after set. Do _1__ sets per session.  http://plyo.exer.us/93

## 2017-08-08 NOTE — Therapy (Signed)
[place] [facility] SERVICES [address] [place], [place], [address] Phone: [phone]   Fax:  [phone]  Physical Therapy Treatment  Patient Details  Name: Angela Bentley MRN: [id number] Date of Birth: 1941/05/27 Referring Provider: Dr. Lacinda Axon   Encounter Date: 08/08/2017  PT End of Session - 08/08/17 1504    Visit Number  12    Number of Visits  17    Date for PT Re-Evaluation  08/21/17    Authorization Type  G code 2    Authorization Time Period  10    PT Start Time  1500    PT Stop Time  1545    PT Time Calculation (min)  45 min    Equipment Utilized During Treatment  Gait belt    Activity Tolerance  Patient tolerated treatment well    Behavior During Therapy  [facility] for tasks assessed/performed       Past Medical History:  Diagnosis Date  . Generalized osteoarthritis of multiple sites   . GERD (gastroesophageal reflux disease)   . History of DVT (deep vein thrombosis) 2016   estrogen and airflights. Rx with xarelto for 3 months  . Hypertension   . Hyperthyroidism    treated briefly in college  . Insomnia   . Retinal tear of left eye   . Rosacea   . Type 2 diabetes, controlled, with peripheral neuropathy ([facility])    Last A1C was a near 7.1  . Uterine fibroid     Past Surgical History:  Procedure Laterality Date  . CARPAL TUNNEL RELEASE Right    Dr Burney Gauze  . CATARACT EXTRACTION    . RETINAL TEAR REPAIR CRYOTHERAPY     10/09/13, then again 3/15    There were no vitals filed for this visit.  Subjective Assessment - 08/08/17 1503    Subjective  patient reports increased fatigue today; She reports that the cold wet weather has not been helpful; She reports that her wooziness is better when walking the dog but today she had trouble walking on a tour having to hold onto her husband or grab the wall; denies any falls;     Pertinent History  76 y/o female with self-reported decrease in balance over the last 2 months  following sudden onset of dizziness and ataxia on 04/01/17. [facility] before follow-up with neurology. Pt has had a recent dx of Left ventricular brain tumor. Per office visit with neurologist on 8/28, "I cannot relate balance deficits completely to this mass although there is a possibility it is contributing". Recent CT scan showed no evidence of primarily malignancy elsewhere. Currently tumor is being monitored with follow-up MRI planned for 2-3 months from 05/23/17. Pt's current balance deficits are worse with first steps fllowing static standing or sitting, though pt reports no imbalance on initail standing. Pt has medical Hx significant for a-fib, DM2, and HTN. No Hx of cancer. Stress test on 12/15/16 indicated that pt was hypertensive before during and after stress test. Blood pressure response was appropriate with exaggerated heart rate response to exercise noted. Pt feels nauseated with water aerobics. Pt reports low visual acuity on R side that has not changed since birth. Pt reports she has ocular migraines that intermittently cause her to see auras on the L    Limitations  Walking;House hold activities;Lifting    How long can you sit comfortably?  Arthritis related pain with prolonged sitting.    How long  can you stand comfortably?  Pt reports no balance issues in static stance    How long can you walk comfortably?  Self reported balance decifits walking that do not get worse with time    Diagnostic tests  MRI brain with and without contrast: There is a 2.5-3 cm enhancing mass situated within the atrium of the left ventricle. There is enlargement of the entire ventricular system out of proportion to the location of this mass. There is global atrophy noted within the brain. There are no other enhancing masses seen.     Patient Stated Goals  To improve balance    Currently in Pain?  No/denies          TREATMENT: Warm up on Nustep BUE/BLE level  3x4 min (Unbilled);  Dynamic balance exercise:  Resisted walking 17.5# forward/backward,x2 laps each with head turns to challenge balance, requiring CGA for safety;  Resisted walking, 12.5# gait crossovers x2 laps each to challenge   Gait in hallway, Forward/backward walking side/side head turns  x100 feet each; Forward/ backward walking with ball toss up/down with multiple position change to challenge dual task/dynamic balance x100 feet;  Patient was more unsteady with lateral head turns; She was able to keep her balance but did veer side/side or would slow down walking;  Standing in corner: Tandem stance full body supported, eyes closed head turns side/side x5 each with cues to keep supported to improve positional awareness; Tandem stance with hands only supported, eyes closed head turns side/side x5 each with cues to improve lateral weight shift to keep neutral position for better balance control;  Advanced HEP: Step to side with SLS hold, lateral x5 steps with progression to wider steps with slight hop x5 steps unsupported; Patient able to step to side but had significant difficulty with maintaining SLS with lateral propulsion; She required cues to increase hip/core stabilization for better SLS:   Gait on treadmill 1.5 mph (natural speed) unsupported with cues for arm swing for natural gait with intermittent head turns side/side with cues for gaze stabilization for less dizziness x3 min, CGA with cues to increase step length, increase speed and improve heel/toe walk as patient often exhibits slower gait speed, unsteady with toe contact at initial contact due to unsteadiness; Patient denies any dizziness but reports feeling unsteady during this activity; Would benefit from additional dynamic balance activity to improve gait safety and reduce fall risk;   Reports increased fatigue at end of session;                 PT Education - 08/08/17 1504    Education provided  Yes     Education Details  balance exercise, HEP reinforced;     Person(s) Educated  Patient    Methods  Explanation;Demonstration;Verbal cues    Comprehension  Verbalized understanding;Returned demonstration;Verbal cues required;Need further instruction       PT Short Term Goals - 07/11/17 0939      PT SHORT TERM GOAL #1   Title   Patient will continue to deny any falls over past 4 weeks to demonstrate successful compensatory strategies and continued safety awareness at home and in community.     Baseline  no falls;     Time  4    Period  Weeks    Status  Achieved      PT SHORT TERM GOAL #2   Title  Pt will perform HEP at least 3/7 days/wk in order to show meaningful improvement in strength and balance  for improved functional mobility and safety.    Baseline  very compliant;     Time  4    Period  Weeks    Status  Achieved        PT Long Term Goals - 07/24/17 1112      PT LONG TERM GOAL #1   Title  Patient will be independent in home exercise program to improve strength/mobility for better functional independence with ADLs.    Time  4    Period  Weeks    Status  Partially Met    Target Date  08/21/17      PT LONG TERM GOAL #2   Title  Patient will complete five times sit to stand test in < 12 seconds indicating an increased LE strength and improved balance in older adult.    Baseline  14 sec without HHA; 07/24/17- 12 sec    Time  4    Period  Weeks    Status  Achieved    Target Date  08/21/17      PT LONG TERM GOAL #3   Title  Patient will increase Functional Gait Assessment score to >28/30 as to reduce fall risk and improve dynamic gait safety with community ambulation.    Baseline  27/30    Time  4    Period  Weeks    Status  Partially Met    Target Date  08/21/17      PT LONG TERM GOAL #4   Title  Patient will increase ABC scale score >80% to demonstrate better functional mobility and better confidence with ADLs.     Baseline  70% confident;     Time  4    Period   Weeks    Status  Partially Met    Target Date  08/21/17      PT LONG TERM GOAL #5   Title  Patient will increase BLE gross strength to 4+/5 as to improve functional strength for independent gait, increased standing tolerance and increased ADL ability.    Time  4    Period  Weeks    Status  Achieved      PT LONG TERM GOAL #6   Title  Pt will ambulate with normal stride width to demonstrate improved gait mechanics with less compensation of balance,  for more efficient and functional mobility    Time  4    Period  Weeks    Status  Achieved            Plan - 08/08/17 1548    Clinical Impression Statement  Patient more unsteady today which could be related to lack of sleep and/or bad weather. Advanced HEP with standing in corner narrow base of support with head turns eyes closed challenging position sense/inner ear. Patient tolerated advanced exercise well. 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    PT Frequency  1x / week    PT Duration  4 weeks    PT Treatment/Interventions  ADLs/Self Care Home Management;Biofeedback;Gait training;Stair training;Balance training;Therapeutic exercise;Therapeutic activities;Functional mobility training;Neuromuscular re-education;Patient/family education;Energy conservation;Visual/perceptual remediation/compensation    PT Home Exercise Plan  cotninue as given;  Consulted and Agree with Plan of Care  Patient       Patient will benefit from skilled therapeutic intervention in order to improve the following deficits and impairments:  Abnormal gait, Decreased activity tolerance, Decreased balance, Decreased  endurance, Decreased mobility, Decreased strength, Dizziness, Difficulty walking, Impaired perceived functional ability, Improper body mechanics, Impaired vision/preception  Visit Diagnosis: Unsteadiness on feet  Muscle weakness (generalized)  Difficulty in walking, not elsewhere classified     Problem List Patient Active Problem List   Diagnosis Date Noted  . Dizziness 04/06/2017  . PAF (paroxysmal atrial fibrillation) ([place]) 02/03/2017  . DOE (dyspnea on exertion) 12/01/2016  . Palpitations 11/03/2016  . Acute right-sided low back pain without sciatica 08/26/2016  . Preventative health care 06/02/2016  . Advance directive discussed with patient 06/02/2016  . Achilles tendonitis 03/11/2016  . Hypertension   . History of DVT (deep vein thrombosis)   . Type 2 diabetes, controlled, with peripheral neuropathy ([place])   . Generalized osteoarthritis of multiple sites   . GERD (gastroesophageal reflux disease)   . Hyperthyroidism     Duanna Runk PT, DPT 08/08/2017, 3:50 PM  [place] [facility] SERVICES [address] [place], [place], [address] Phone: [phone]   Fax:  [phone]  Name: Angela Bentley MRN: [id number] Date of Birth: 1941-06-13

## 2017-08-10 ENCOUNTER — Ambulatory Visit: Payer: Medicare Other | Admitting: Cardiovascular Disease

## 2017-08-14 ENCOUNTER — Ambulatory Visit: Payer: Medicare Other | Admitting: Physical Therapy

## 2017-08-14 NOTE — Telephone Encounter (Signed)
It looks like the A1C was not drawn. Do you want her to come in or just have her A1C done as a lab visit?

## 2017-08-14 NOTE — Telephone Encounter (Signed)
I put in the lab orders---have her come in for the blood draw and set up follow up for 1-2 weeks later

## 2017-08-15 NOTE — Telephone Encounter (Signed)
MyChart message sent to pt

## 2017-08-15 NOTE — Telephone Encounter (Signed)
She may want to wait for the visit in case there is something else I want to check after I see her. If she doesn't mind coming in early, can have the tests done before and I can redraw if there is anything else of concern

## 2017-08-15 NOTE — Telephone Encounter (Signed)
Dr [name], she has a Medicare Wellness Exam on 09-05-17. Are there any other labs you need to add for her to have done when she comes for the lab visit, or should she wait to have the A1C and Renal Panel done at that visit?

## 2017-08-16 ENCOUNTER — Encounter: Payer: Self-pay | Admitting: Physical Therapy

## 2017-08-16 ENCOUNTER — Ambulatory Visit: Payer: Medicare Other | Admitting: Physical Therapy

## 2017-08-16 DIAGNOSIS — R2681 Unsteadiness on feet: Secondary | ICD-10-CM

## 2017-08-16 DIAGNOSIS — R262 Difficulty in walking, not elsewhere classified: Secondary | ICD-10-CM | POA: Diagnosis not present

## 2017-08-16 DIAGNOSIS — M6281 Muscle weakness (generalized): Secondary | ICD-10-CM | POA: Diagnosis not present

## 2017-08-16 NOTE — Therapy (Signed)
[place] MAIN [facility] SERVICES [address] [place], [place], [address] Phone: [phone]   Fax:  [phone]  Physical Therapy Treatment  Patient Details  Name: Angela Bentley MRN: [id number] Date of Birth: 10-01-1940 Referring Provider: Dr. Lacinda Axon   Encounter Date: 08/16/2017  PT End of Session - 08/16/17 1528    Visit Number  13    Number of Visits  17    Date for PT Re-Evaluation  08/21/17    Authorization Type  G code 3    Authorization Time Period  10    PT Start Time  1515    PT Stop Time  1600    PT Time Calculation (min)  45 min    Equipment Utilized During Treatment  Gait belt    Activity Tolerance  Patient tolerated treatment well    Behavior During Therapy  [facility] for tasks assessed/performed       Past Medical History:  Diagnosis Date  . Generalized osteoarthritis of multiple sites   . GERD (gastroesophageal reflux disease)   . History of DVT (deep vein thrombosis) 2016   estrogen and airflights. Rx with xarelto for 3 months  . Hypertension   . Hyperthyroidism    treated briefly in college  . Insomnia   . Retinal tear of left eye   . Rosacea   . Type 2 diabetes, controlled, with peripheral neuropathy (HCC)    Last A1C was a near 7.1  . Uterine fibroid     Past Surgical History:  Procedure Laterality Date  . CARPAL TUNNEL RELEASE Right    Dr Burney Gauze  . CATARACT EXTRACTION    . RETINAL TEAR REPAIR CRYOTHERAPY     10/09/13, then again 3/15    There were no vitals filed for this visit.  Subjective Assessment - 08/16/17 1523    Subjective  Patient reports having a busy week. She reports sleeping well last night and was able to stand on one leg well today; She reports that is the only connection she has been able to see is that if she gets a good nights rest she is able to balance well the next day; She reports having trouble with memory this week. She hasn't been able to do as many HEP this week due to family being  in town;     Pertinent History  76 y/o female with self-reported decrease in balance over the last 2 months following sudden onset of dizziness and ataxia on 04/01/17. Pt was initially diagnosed with dehydration at a minute clinic before follow-up with neurology. Pt has had a recent dx of Left ventricular brain tumor. Per office visit with neurologist on 8/28, "I cannot relate balance deficits completely to this mass although there is a possibility it is contributing". Recent CT scan showed no evidence of primarily malignancy elsewhere. Currently tumor is being monitored with follow-up MRI planned for 2-3 months from 05/23/17. Pt's current balance deficits are worse with first steps fllowing static standing or sitting, though pt reports no imbalance on initail standing. Pt has medical Hx significant for a-fib, DM2, and HTN. No Hx of cancer. Stress test on 12/15/16 indicated that pt was hypertensive before during and after stress test. Blood pressure response was appropriate with exaggerated heart rate response to exercise noted. Pt feels nauseated with water aerobics. Pt reports low visual acuity on R side that has not changed since birth. Pt reports she has ocular migraines that intermittently cause her to see auras on the L  Limitations  Walking;House hold activities;Lifting    How long can you sit comfortably?  Arthritis related pain with prolonged sitting.    How long can you stand comfortably?  Pt reports no balance issues in static stance    How long can you walk comfortably?  Self reported balance decifits walking that do not get worse with time    Diagnostic tests  MRI brain with and without contrast: There is a 2.5-3 cm enhancing mass situated within the atrium of the left ventricle. There is enlargement of the entire ventricular system out of proportion to the location of this mass. There is global atrophy noted within the brain. There are no other enhancing masses seen.     Patient Stated Goals  To  improve balance            TREATMENT: Warm up on Nustep BUE/BLE level 3x4 min (Unbilled);  Dynamic balance exercise:  Gait in hallway, Forward/backward walking side/side head turns  x100 feet each; Forward walking with lateral ball toss and catch to challenge dual task/dynamic balance x100 feet each direction;  Patient was able to exhibit better dynamic balance with lateral head turns with less left foot drag and no veering today; she did have significant difficulty with lateral ball toss due to impaired coordination however denies any imbalance;   Reinforced HEP Step to side with SLS hold, lateral x10 steps with progression to wider steps with slight hop unsupported; Patient able to step to side but had significant difficulty with maintaining SLS with lateral propulsion; She required cues to increase hold time of SLS for better balance challenge;    Gait on treadmill 1.5 mph (natural speed) unsupported with cues for arm swing for natural gait with intermittent head turns side/side and up/down with cues for gaze stabilization for less dizziness x3 min, CGA with cues to increase step length, increase speed and improve heel/toe walk as patient often exhibits slower gait speed, unsteady with toe contact at initial contact due to unsteadiness; Patient denies any dizziness but reports feeling unsteady during this activity;  Gait on treadmill 0.5 mph with 2-0 rail assist with directional change- walking forward and then turning and side stepping x5 min with multiple directional change; Patient required CGA for safety and cues to increase step length especially when walking forward and slow down gait speed for better cadence; Patient often takes short shuffled steps to match natural gait speed; She reports feeing unsteady and exhibiting heavy toe contact at initial strike when slowing gait speed;  Patient reports increased wooziness when getting off treadmill with increased fatigue;   Recommended patient continue with HEP as given to address static/dynamic balance;  Would benefit from additional dynamic balance activity to improve gait safety and reduce fall risk;                      PT Education - 08/16/17 1528    Education provided  Yes    Education Details  balance exercise, HEP reinforced;     Person(s) Educated  Patient    Methods  Explanation;Demonstration;Verbal cues    Comprehension  Verbalized understanding;Returned demonstration;Verbal cues required;Need further instruction       PT Short Term Goals - 07/11/17 0939      PT SHORT TERM GOAL #1   Title   Patient will continue to deny any falls over past 4 weeks to demonstrate successful compensatory strategies and continued safety awareness at home and in community.     Baseline  no falls;     Time  4    Period  Weeks    Status  Achieved      PT SHORT TERM GOAL #2   Title  Pt will perform HEP at least 3/7 days/wk in order to show meaningful improvement in strength and balance for improved functional mobility and safety.    Baseline  very compliant;     Time  4    Period  Weeks    Status  Achieved        PT Long Term Goals - 07/24/17 1112      PT LONG TERM GOAL #1   Title  Patient will be independent in home exercise program to improve strength/mobility for better functional independence with ADLs.    Time  4    Period  Weeks    Status  Partially Met    Target Date  08/21/17      PT LONG TERM GOAL #2   Title  Patient will complete five times sit to stand test in < 12 seconds indicating an increased LE strength and improved balance in older adult.    Baseline  14 sec without HHA; 07/24/17- 12 sec    Time  4    Period  Weeks    Status  Achieved    Target Date  08/21/17      PT LONG TERM GOAL #3   Title  Patient will increase Functional Gait Assessment score to >28/30 as to reduce fall risk and improve dynamic gait safety with community ambulation.    Baseline  27/30     Time  4    Period  Weeks    Status  Partially Met    Target Date  08/21/17      PT LONG TERM GOAL #4   Title  Patient will increase ABC scale score >80% to demonstrate better functional mobility and better confidence with ADLs.     Baseline  70% confident;     Time  4    Period  Weeks    Status  Partially Met    Target Date  08/21/17      PT LONG TERM GOAL #5   Title  Patient will increase BLE gross strength to 4+/5 as to improve functional strength for independent gait, increased standing tolerance and increased ADL ability.    Time  4    Period  Weeks    Status  Achieved      PT LONG TERM GOAL #6   Title  Pt will ambulate with normal stride width to demonstrate improved gait mechanics with less compensation of balance,  for more efficient and functional mobility    Time  4    Period  Weeks    Status  Achieved            Plan - 08/16/17 1623    Clinical Impression Statement  Patient instructed in advanced dynamic balance exercise. She does exhibit less left foot drag when walking on even surface with lateral head turns. However patient does have difficulty with dual tasks with impaired coordination. She reports that this does not increase her unsteadiness; Patient instructed in advanced dynamic balance exercise on treadmill with head turns and directional change. She had most difficulty with right lateral head turn. Patient also had difficulty with speed and directional change. Her imbalance seems to be related to poor depth perception with impaired vision as well as impaired body proprioception from inner ear dysfunction. 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    PT Frequency  1x / week    PT Duration  4 weeks    PT Treatment/Interventions  ADLs/Self Care Home Management;Biofeedback;Gait  training;Stair training;Balance training;Therapeutic exercise;Therapeutic activities;Functional mobility training;Neuromuscular re-education;Patient/family education;Energy conservation;Visual/perceptual remediation/compensation    PT Home Exercise Plan  cotninue as given;     Consulted and Agree with Plan of Care  Patient       Patient will benefit from skilled therapeutic intervention in order to improve the following deficits and impairments:  Abnormal gait, Decreased activity tolerance, Decreased balance, Decreased endurance, Decreased mobility, Decreased strength, Dizziness, Difficulty walking, Impaired perceived functional ability, Improper body mechanics, Impaired vision/preception  Visit Diagnosis: Unsteadiness on feet  Muscle weakness (generalized)  Difficulty in walking, not elsewhere classified     Problem List Patient Active Problem List   Diagnosis Date Noted  . Dizziness 04/06/2017  . PAF (paroxysmal atrial fibrillation) ([place]) 02/03/2017  . DOE (dyspnea on exertion) 12/01/2016  . Palpitations 11/03/2016  . Acute right-sided low back pain without sciatica 08/26/2016  . Preventative health care 06/02/2016  . Advance directive discussed with patient 06/02/2016  . Achilles tendonitis 03/11/2016  . Hypertension   . History of DVT (deep vein thrombosis)   . Type 2 diabetes, controlled, with peripheral neuropathy ([place])   . Generalized osteoarthritis of multiple sites   . GERD (gastroesophageal reflux disease)   . Hyperthyroidism     Fairley Copher PT, DPT 08/16/2017, 4:28 PM  [place] MAIN [facility] SERVICES [address] [place], [place], [address] Phone: [phone]   Fax:  [phone]  Name: Angela Bentley MRN: [id number] Date of Birth: 1941/03/01

## 2017-08-21 ENCOUNTER — Ambulatory Visit: Payer: Medicare Other | Admitting: Physical Therapy

## 2017-08-23 ENCOUNTER — Encounter: Payer: Self-pay | Admitting: Physical Therapy

## 2017-08-23 ENCOUNTER — Ambulatory Visit: Payer: Medicare Other | Admitting: Physical Therapy

## 2017-08-23 DIAGNOSIS — M6281 Muscle weakness (generalized): Secondary | ICD-10-CM | POA: Diagnosis not present

## 2017-08-23 DIAGNOSIS — R2681 Unsteadiness on feet: Secondary | ICD-10-CM

## 2017-08-23 DIAGNOSIS — R262 Difficulty in walking, not elsewhere classified: Secondary | ICD-10-CM | POA: Diagnosis not present

## 2017-08-23 NOTE — Therapy (Signed)
[place] MAIN [facility] SERVICES [address] [place], [place], [address] Phone: [phone]   Fax:  [phone]  Physical Therapy Treatment/Discharge Summary  Patient Details  Name: Angela Bentley MRN: [id number] Date of Birth: 1940-10-02 Referring Provider: Dr. Lacinda Axon   Encounter Date: 08/23/2017  PT End of Session - 08/23/17 1357    Visit Number  14    Number of Visits  17    Date for PT Re-Evaluation  08/21/17    Authorization Type  G code 4    Authorization Time Period  10    PT Start Time  1345    PT Stop Time  1430    PT Time Calculation (min)  45 min    Equipment Utilized During Treatment  Gait belt    Activity Tolerance  Patient tolerated treatment well    Behavior During Therapy  [facility] for tasks assessed/performed       Past Medical History:  Diagnosis Date  . Generalized osteoarthritis of multiple sites   . GERD (gastroesophageal reflux disease)   . History of DVT (deep vein thrombosis) 2016   estrogen and airflights. Rx with xarelto for 3 months  . Hypertension   . Hyperthyroidism    treated briefly in college  . Insomnia   . Retinal tear of left eye   . Rosacea   . Type 2 diabetes, controlled, with peripheral neuropathy (HCC)    Last A1C was a near 7.1  . Uterine fibroid     Past Surgical History:  Procedure Laterality Date  . CARPAL TUNNEL RELEASE Right    Dr Burney Gauze  . CATARACT EXTRACTION    . RETINAL TEAR REPAIR CRYOTHERAPY     10/09/13, then again 3/15    There were no vitals filed for this visit.  Subjective Assessment - 08/23/17 1353    Subjective  Patient reports slight increase in LLE discomfort/hip due to arthritis. The cold weather aggravates it. She reports being more tired today due to recent holiday;     Pertinent History  76 y/o female with self-reported decrease in balance over the last 2 months following sudden onset of dizziness and ataxia on 04/01/17. Pt was initially diagnosed with dehydration  at a minute clinic before follow-up with neurology. Pt has had a recent dx of Left ventricular brain tumor. Per office visit with neurologist on 8/28, "I cannot relate balance deficits completely to this mass although there is a possibility it is contributing". Recent CT scan showed no evidence of primarily malignancy elsewhere. Currently tumor is being monitored with follow-up MRI planned for 2-3 months from 05/23/17. Pt's current balance deficits are worse with first steps fllowing static standing or sitting, though pt reports no imbalance on initail standing. Pt has medical Hx significant for a-fib, DM2, and HTN. No Hx of cancer. Stress test on 12/15/16 indicated that pt was hypertensive before during and after stress test. Blood pressure response was appropriate with exaggerated heart rate response to exercise noted. Pt feels nauseated with water aerobics. Pt reports low visual acuity on R side that has not changed since birth. Pt reports she has ocular migraines that intermittently cause her to see auras on the L    Limitations  Walking;House hold activities;Lifting    How long can you sit comfortably?  Arthritis related pain with prolonged sitting.    How long can you stand comfortably?  Pt reports no balance issues in static stance    How long can you walk comfortably?  Self reported balance decifits walking that do not get worse with time    Diagnostic tests  MRI brain with and without contrast: There is a 2.5-3 cm enhancing mass situated within the atrium of the left ventricle. There is enlargement of the entire ventricular system out of proportion to the location of this mass. There is global atrophy noted within the brain. There are no other enhancing masses seen.     Patient Stated Goals  To improve balance    Currently in Pain?  Yes    Pain Score  4     Pain Location  Hip    Pain Orientation  Left    Pain Descriptors / Indicators  Aching;Sore    Pain Type  Chronic pain    Pain Onset  In the  past 7 days    Pain Frequency  Intermittent    Aggravating Factors   worse with cold weather,     Pain Relieving Factors  rest    Effect of Pain on Daily Activities  decreased activity tolerance;     Multiple Pain Sites  No         [facility] PT Assessment - 08/23/17 0001      Observation/Other Assessments   Activities of Balance Confidence Scale (ABC Scale)   79.3% (the higher the score the less fear of falling, improved from 70% on 07/11/17)      Functional Gait  Assessment   Gait Level Surface  Walks 20 ft in less than 5.5 sec, no assistive devices, good speed, no evidence for imbalance, normal gait pattern, deviates no more than 6 in outside of the 12 in walkway width.    Change in Gait Speed  Able to smoothly change walking speed without loss of balance or gait deviation. Deviate no more than 6 in outside of the 12 in walkway width.    Gait with Horizontal Head Turns  Performs head turns smoothly with no change in gait. Deviates no more than 6 in outside 12 in walkway width    Gait with Vertical Head Turns  Performs head turns with no change in gait. Deviates no more than 6 in outside 12 in walkway width.    Gait and Pivot Turn  Pivot turns safely within 3 sec and stops quickly with no loss of balance.    Step Over Obstacle  Is able to step over 2 stacked shoe boxes taped together (9 in total height) without changing gait speed. No evidence of imbalance.    Gait with Narrow Base of Support  Is able to ambulate for 10 steps heel to toe with no staggering.    Gait with Eyes Closed  Walks 20 ft, uses assistive device, slower speed, mild gait deviations, deviates 6-10 in outside 12 in walkway width. Ambulates 20 ft in less than 9 sec but greater than 7 sec.    Ambulating Backwards  Walks 20 ft, no assistive devices, good speed, no evidence for imbalance, normal gait    Steps  Alternating feet, no rail.    Total Score  29 (low fall risk, improved from 27/30)     TREATMENT: Warm up on  Crosstrainer level 0 x5 min (Unbilled)  Instructed patient in Functional gait assessment, ABC scale etc to address goals; see above; Patient did require min VCs for correct activity technique with FGA but was able to exhibit improvement;   Reinforced HEP: patient reports feeling unsteady with side step/jump; Instructed patient to hold off on this exercise at  this time as she has been having increased foot pain with initial standing/walking; Patient verbalized understanding;  Also educated patient in ways to improve overall fitness such as walking on track at complex, riding nustep, getting in pool etc. Patient verbalized understanding;  Patient has met most goals and is appropriate for discharge from therapy at this time; She is agreeable;                   PT Education - 08/23/17 1356    Education provided  Yes    Education Details  balance, exercise, HEP reinforced;     Person(s) Educated  Patient    Methods  Explanation;Demonstration;Verbal cues    Comprehension  Verbalized understanding;Returned demonstration;Verbal cues required;Need further instruction       PT Short Term Goals - 07/11/17 0939      PT SHORT TERM GOAL #1   Title   Patient will continue to deny any falls over past 4 weeks to demonstrate successful compensatory strategies and continued safety awareness at home and in community.     Baseline  no falls;     Time  4    Period  Weeks    Status  Achieved      PT SHORT TERM GOAL #2   Title  Pt will perform HEP at least 3/7 days/wk in order to show meaningful improvement in strength and balance for improved functional mobility and safety.    Baseline  very compliant;     Time  4    Period  Weeks    Status  Achieved        PT Long Term Goals - 08/23/17 1357      PT LONG TERM GOAL #1   Title  Patient will be independent in home exercise program to improve strength/mobility for better functional independence with ADLs.    Time  4    Period  Weeks     Status  Achieved    Target Date  08/21/17      PT LONG TERM GOAL #2   Title  Patient will complete five times sit to stand test in < 12 seconds indicating an increased LE strength and improved balance in older adult.    Baseline  14 sec without HHA; 07/24/17- 12 sec    Time  4    Period  Weeks    Status  Achieved    Target Date  08/21/17      PT LONG TERM GOAL #3   Title  Patient will increase Functional Gait Assessment score to >28/30 as to reduce fall risk and improve dynamic gait safety with community ambulation.    Baseline  29/30    Time  4    Period  Weeks    Status  Achieved    Target Date  08/21/17      PT LONG TERM GOAL #4   Title  Patient will increase ABC scale score >80% to demonstrate better functional mobility and better confidence with ADLs.     Baseline  79% confident;     Time  4    Period  Weeks    Status  Partially Met    Target Date  08/21/17      PT LONG TERM GOAL #5   Title  Patient will increase BLE gross strength to 4+/5 as to improve functional strength for independent gait, increased standing tolerance and increased ADL ability.    Time  4    Period  Weeks  Status  Achieved    Target Date  08/21/17      PT LONG TERM GOAL #6   Title  Pt will ambulate with normal stride width to demonstrate improved gait mechanics with less compensation of balance,  for more efficient and functional mobility    Time  4    Period  Weeks    Status  Achieved    Target Date  08/21/17            Plan - 09/08/17 1502    Clinical Impression Statement  PT instructed patient in outcome measures to assess progress towards goals. She has met most goals. While she didn't quite meet  her ABC scale goal she was very close. Patient has been given an extensive HEP. She is independent in HEP completion. Patient continues to have slight "fogginess" of head but reports walking better with less unsteadiness; Patient is appropriate for discharge from PT at this time. She is  agreeable to working on HEP for next few months to see how she does.     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    PT Frequency  1x / week    PT Duration  4 weeks    PT Treatment/Interventions  ADLs/Self Care Home Management;Biofeedback;Gait training;Stair training;Balance training;Therapeutic exercise;Therapeutic activities;Functional mobility training;Neuromuscular re-education;Patient/family education;Energy conservation;Visual/perceptual remediation/compensation    PT Home Exercise Plan  continue as given;     Consulted and Agree with Plan of Care  Patient       Patient will benefit from skilled therapeutic intervention in order to improve the following deficits and impairments:  Abnormal gait, Decreased activity tolerance, Decreased balance, Decreased endurance, Decreased mobility, Decreased strength, Dizziness, Difficulty walking, Impaired perceived functional ability, Improper body mechanics, Impaired vision/preception  Visit Diagnosis: Unsteadiness on feet  Muscle weakness (generalized)  Difficulty in walking, not elsewhere classified   G-Codes - 09/08/2017 1506    Functional Assessment Tool Used (Outpatient Only)  FGA,  5 times sit<>stand, 10 meter walk, clinical judgement;     Functional Limitation  Mobility: Walking and moving around    Mobility: Walking and Moving Around Goal Status [phone])  At least 1 percent but less than 20 percent impaired, limited or restricted    Mobility: Walking and Moving Around Discharge Status [phone])  At least 1 percent but less than 20 percent impaired, limited or restricted       Problem List Patient Active Problem List   Diagnosis Date Noted  . Dizziness 04/06/2017  . PAF (paroxysmal atrial fibrillation) ([place]) 02/03/2017  . DOE (dyspnea on exertion) 12/01/2016  . Palpitations 11/03/2016  . Acute right-sided low back pain without sciatica 08/26/2016  . Preventative  health care 06/02/2016  . Advance directive discussed with patient 06/02/2016  . Achilles tendonitis 03/11/2016  . Hypertension   . History of DVT (deep vein thrombosis)   . Type 2 diabetes, controlled, with peripheral neuropathy ([employer] [employer])   . Generalized osteoarthritis of multiple sites   . GERD (gastroesophageal reflux disease)   . Hyperthyroidism     Trotter,Margaret PT, DPT 09-08-2017, 3:06 PM  [place] MAIN [facility] SERVICES [address] [place], [place], [address] Phone: [phone]   Fax:  [phone]  Name: Paolina Karwowski MRN: [id number] Date of Birth: 28-Mar-1941

## 2017-08-28 ENCOUNTER — Ambulatory Visit: Payer: Medicare Other | Admitting: Physical Therapy

## 2017-08-30 ENCOUNTER — Other Ambulatory Visit
Admission: RE | Admit: 2017-08-30 | Discharge: 2017-08-30 | Disposition: A | Discharge status: Home / Self Care | Payer: Medicare Other | Source: Ambulatory Visit | Attending: Nurse Practitioner | Admitting: Nurse Practitioner

## 2017-08-30 ENCOUNTER — Ambulatory Visit (INDEPENDENT_AMBULATORY_CARE_PROVIDER_SITE_OTHER): Payer: Medicare Other | Admitting: Nurse Practitioner

## 2017-08-30 ENCOUNTER — Encounter: Payer: Self-pay | Admitting: Nurse Practitioner

## 2017-08-30 VITALS — BP 122/62 | HR 83 | Ht 66.0 in | Wt 207.0 lb

## 2017-08-30 DIAGNOSIS — I480 Paroxysmal atrial fibrillation: Secondary | ICD-10-CM | POA: Insufficient documentation

## 2017-08-30 DIAGNOSIS — E119 Type 2 diabetes mellitus without complications: Secondary | ICD-10-CM

## 2017-08-30 DIAGNOSIS — I10 Essential (primary) hypertension: Secondary | ICD-10-CM

## 2017-08-30 LAB — BASIC METABOLIC PANEL
Anion gap: 9 (ref 5–15)
BUN: 15 mg/dL (ref 6–20)
CO2: 29 mmol/L (ref 22–32)
Calcium: 9.3 mg/dL (ref 8.9–10.3)
Chloride: 100 mmol/L — ABNORMAL LOW (ref 101–111)
Creatinine, Ser: 0.73 mg/dL (ref 0.44–1.00)
GFR calc Af Amer: >60 mL/min (ref >60)
GFR calc non Af Amer: >60 mL/min (ref >60)
Glucose, Bld: 116 mg/dL — ABNORMAL HIGH (ref 65–99)
Potassium: 4 mmol/L (ref 3.5–5.1)
Sodium: 138 mmol/L (ref 135–145)

## 2017-08-30 LAB — CBC
HCT: 35.7 % (ref 35.0–47.0)
Hemoglobin: 12.1 g/dL (ref 12.0–16.0)
MCH: 29.9 pg (ref 26.0–34.0)
MCHC: 33.8 g/dL (ref 32.0–36.0)
MCV: 88.4 fL (ref 80.0–100.0)
Platelets: 229 10*3/uL (ref 150–440)
RBC: 4.03 MIL/uL (ref 3.80–5.20)
RDW: 13.6 % (ref 11.5–14.5)
WBC: 7.7 10*3/uL (ref 3.6–11.0)

## 2017-08-30 NOTE — Patient Instructions (Signed)
Medication Instructions:  Your physician recommends that you continue on your current medications as directed. Please refer to the Current Medication list given to you today.   Labwork: BMET, CBC, hgb A1c at the [facility]. No appt needed.   Testing/Procedures: none  Follow-Up: Your physician wants you to follow-up in: 6 months with Dr. Rockey Situ.  You will receive a reminder letter in the mail two months in advance. If you don't receive a letter, please call our office to schedule the follow-up appointment.   Any Other Special Instructions Will Be Listed Below (If Applicable).     If you need a refill on your cardiac medications before your next appointment, please call your pharmacy.

## 2017-08-30 NOTE — Progress Notes (Signed)
Office Visit    Patient Name: Angela Bentley Date of Encounter: 08/30/2017  Primary Care Provider:  [name], MD Primary Cardiologist:  Ida Rogue, MD  Chief Complaint    76 y/o ? with a history of paroxysmal atrial fibrillation, hypertension, diabetes, GERD, persistent gait imbalance, and left intraventricular brain tumor, who presents for follow-up.  Past Medical History    Past Medical History:  Diagnosis Date  . Brain tumor ([place])    a. Left intraventricular tumor ->stable by 06/2017 MRI. Followed @ [facility].  . Gait disturbance    a. Unsteady on feet/balance difficulty.  . Generalized osteoarthritis of multiple sites   . GERD (gastroesophageal reflux disease)   . History of DVT (deep vein thrombosis) 2016   estrogen and airflights. Rx with xarelto for 3 months  . History of stress test    a. 11/2016 ETT: No ST/T changes.  Performed to assess PAC/PVC burden with activity ->No ectopy noted.  . Hypertension   . Hyperthyroidism    treated briefly in college  . Insomnia   . Obesity   . PAF (paroxysmal atrial fibrillation) ([place])    a. 12/2016 Event Monitor: brief run of PAF-->CHA2DS2VASc = 5--> Xarelto.  Marland Kitchen Retinal tear of left eye   . Rosacea   . Type 2 diabetes, controlled, with peripheral neuropathy ([place])    a. 11/2016 A1c 7.4.  . Uterine fibroid    Past Surgical History:  Procedure Laterality Date  . CARPAL TUNNEL RELEASE Right    Dr Burney Gauze  . CATARACT EXTRACTION    . RETINAL TEAR REPAIR CRYOTHERAPY     10/09/13, then again 3/15    Allergies  Allergies  Allergen Reactions  . Penicillins     Has patient had a PCN reaction causing immediate rash, facial/tongue/throat swelling, SOB or lightheadedness with hypotension: Yes Has patient had a PCN reaction causing severe rash involving mucus membranes or skin necrosis: Yes Has patient had a PCN reaction that required hospitalization No Has patient had a PCN reaction occurring within the last 10 years:  No If all of the above answers are "NO", then may proceed with Cephalosporin use.    . Sulfa Antibiotics     Rash/itching  . Azithromycin Palpitations    History of Present Illness    76 y/o ? with the above complex past medical history including hypertension, obesity, diabetes, GERD, and prior history of DVT.  Earlier this year, she was diagnosed with paroxysmal atrial fibrillation after complaining of palpitations and wearing an event monitor which showed a brief run of atrial fibrillation.  She has since been treated with Xarelto in the setting of a CHA2DS2VASc of 5.  She has tolerated this well.  More recently, in the setting of chronic gait instability and imbalance, she was found to have a left intraventricular brain tumor.  She has been followed by neurosurgery.  She is not currently interested in resection as it is not felt that this tumor is impacting her balance.  She is scheduled for follow-up MRI in a couple months.  Through a cardiac standpoint she has done well since her last visit.  She has not had any palpitations and denies chest pain, dyspnea, PND, orthopnea, syncope, edema, or early satiety.  Home Medications    Prior to Admission medications   Medication Sig Start Date End Date Taking? Authorizing Provider  Ascorbic Acid (VITAMIN C) 100 MG CHEW Chew 200 mg by mouth daily as needed (immune system support).   Yes  [provider]  celecoxib (CELEBREX) 200 MG capsule Take 1 capsule (200 mg total) by mouth as needed for mild pain or moderate pain. 08/26/16  Yes [name], MD  fluticasone (VERAMYST) 27.5 MCG/SPRAY nasal spray Place 2 sprays into the nose daily as needed for allergies.    Yes [provider]  glimepiride (AMARYL) 1 MG tablet TAKE 1 TABLET EVERY DAY WITH BREAKFAST 06/12/17  Yes [name], MD  guaiFENesin-codeine (ROBITUSSIN AC) 100-10 MG/5ML syrup Take 5 mLs by mouth 3 (three) times daily as needed for cough. 06/28/17  Yes [name], MD  HYDROcodone-homatropine [facility]) 5-1.5 MG/5ML syrup Take 5 mLs by mouth at bedtime as needed for cough. 06/28/17  Yes [name], MD  losartan-hydrochlorothiazide (HYZAAR) 100-12.5 MG tablet Take 1 tablet by mouth daily. 06/12/17  Yes [name], MD  meclizine (ANTIVERT) 25 MG tablet Take 1 tablet (25 mg total) by mouth 3 (three) times daily as needed for dizziness. 04/06/17  Yes [name], MD  metoprolol succinate (TOPROL-XL) 25 MG 24 hr tablet Take 1 tablet (25 mg total) by mouth daily. 07/14/17  Yes [name], MD  Multiple Vitamin (MULTIVITAMIN) tablet Take 1 tablet by mouth daily.   Yes [provider]  pioglitazone (ACTOS) 45 MG tablet Take 1 tablet (45 mg total) by mouth daily. 06/12/17  Yes [name], MD  rivaroxaban (XARELTO) 20 MG TABS tablet Take 1 tablet (20 mg total) by mouth daily with supper. 06/12/17  Yes [name], MD    Review of Systems    Persistent gait instability  improved since PT.  She denies chest pain, palpitations, dyspnea, pnd, orthopnea, n, v, syncope, edema, weight gain, or early satiety.  All other systems reviewed and are otherwise negative except as noted above.  Physical Exam    VS:  BP 122/62 (BP Location: Left Arm, Patient Position: Sitting, Cuff Size: Normal)   Pulse 83   Ht 5\' 6"  (1.676 m)   Wt 207 lb (93.9 kg)   LMP 09/26/1997   BMI 33.41 kg/m  , BMI Body mass index is 33.41 kg/m. GEN: Well nourished, well developed, in no acute distress.  HEENT: normal.  Neck: Supple, no JVD, carotid bruits, or masses. Cardiac: RRR, no murmurs, rubs, or gallops. No clubbing, cyanosis, edema.  Radials/DP/PT 2+ and equal bilaterally.  Respiratory:  Respirations regular and unlabored, clear to auscultation bilaterally. GI: Soft, nontender, nondistended, BS + x 4. MS: no deformity or atrophy. Skin: warm and dry, no rash. Neuro:  Strength and sensation are intact. Psych: Normal affect.  Accessory  Clinical Findings    ECG -regular sinus rhythm, 83, left atrial enlargement, nonspecific T changes.  Assessment & Plan    1.  Paroxysmal atrial fibrillation: Patient has been doing well without any recurrent palpitations.  She has been tolerating Xarelto and metoprolol well.  I will follow-up a CBC and basic metabolic panel today.  She has not had these reevaluated since initiation of Xarelto.  2.  Essential hypertension: Well-controlled on beta-blocker, ARB, and diuretic therapy.  3.  Type 2 diabetes mellitus: On Actos and Amaryl.  Followed by primary care.  Most recent A1c was 7.4 in March.  She said she was supposed to have it repeated in September but in the setting of her husband's illness and her dealing with workup for brain tumor, she never did have labs drawn.  Since we are checking labs today, I will go ahead and  obtain.  She has to follow-up with primary care next week.  4.  Hyperlipidemia: Lipids in September 2017 revealed a total cholesterol 192 with LDL of 106.  We will have a low threshold to initiate statin therapy in the setting of diabetes.   Defer to primary care.  5.  Disposition: Today's visit required 45 minutes.  Follow-up with Dr. Rockey Situ in 6 months or sooner if necessary.  Follow-up lab work today.  Murray Hodgkins, NP 08/30/2017, 4:23 PM

## 2017-08-31 LAB — HEMOGLOBIN A1C
Hgb A1c MFr Bld: 6.5 % — ABNORMAL HIGH (ref 4.8–5.6)
Mean Plasma Glucose: 139.85 mg/dL

## 2017-09-05 ENCOUNTER — Ambulatory Visit: Payer: Medicare Other | Admitting: Internal Medicine

## 2017-09-06 ENCOUNTER — Ambulatory Visit: Payer: Medicare Other | Admitting: Physical Therapy

## 2017-09-07 DIAGNOSIS — C44622 Squamous cell carcinoma of skin of right upper limb, including shoulder: Secondary | ICD-10-CM | POA: Diagnosis not present

## 2017-09-07 DIAGNOSIS — L720 Epidermal cyst: Secondary | ICD-10-CM | POA: Diagnosis not present

## 2017-09-07 DIAGNOSIS — D1801 Hemangioma of skin and subcutaneous tissue: Secondary | ICD-10-CM | POA: Diagnosis not present

## 2017-09-07 DIAGNOSIS — C4492 Squamous cell carcinoma of skin, unspecified: Secondary | ICD-10-CM

## 2017-09-07 DIAGNOSIS — B353 Tinea pedis: Secondary | ICD-10-CM | POA: Diagnosis not present

## 2017-09-07 DIAGNOSIS — L708 Other acne: Secondary | ICD-10-CM | POA: Diagnosis not present

## 2017-09-07 DIAGNOSIS — T7840XA Allergy, unspecified, initial encounter: Secondary | ICD-10-CM | POA: Diagnosis not present

## 2017-09-07 DIAGNOSIS — D485 Neoplasm of uncertain behavior of skin: Secondary | ICD-10-CM | POA: Diagnosis not present

## 2017-09-07 DIAGNOSIS — L814 Other melanin hyperpigmentation: Secondary | ICD-10-CM | POA: Diagnosis not present

## 2017-09-07 DIAGNOSIS — L570 Actinic keratosis: Secondary | ICD-10-CM | POA: Diagnosis not present

## 2017-09-07 DIAGNOSIS — D229 Melanocytic nevi, unspecified: Secondary | ICD-10-CM | POA: Diagnosis not present

## 2017-09-07 DIAGNOSIS — L718 Other rosacea: Secondary | ICD-10-CM | POA: Diagnosis not present

## 2017-09-07 DIAGNOSIS — L821 Other seborrheic keratosis: Secondary | ICD-10-CM | POA: Diagnosis not present

## 2017-09-07 HISTORY — DX: Squamous cell carcinoma of skin, unspecified: C44.92

## 2017-09-08 ENCOUNTER — Encounter: Payer: Self-pay | Admitting: Nurse Practitioner

## 2017-09-13 ENCOUNTER — Ambulatory Visit: Payer: Medicare Other | Admitting: Physical Therapy

## 2017-09-20 ENCOUNTER — Ambulatory Visit: Payer: Medicare Other | Admitting: Physical Therapy

## 2017-09-21 ENCOUNTER — Ambulatory Visit (INDEPENDENT_AMBULATORY_CARE_PROVIDER_SITE_OTHER): Payer: Medicare Other | Admitting: Internal Medicine

## 2017-09-21 ENCOUNTER — Encounter: Payer: Self-pay | Admitting: Internal Medicine

## 2017-09-21 VITALS — BP 124/80 | HR 83 | Temp 97.9°F | Ht 65.75 in | Wt 210.0 lb

## 2017-09-21 DIAGNOSIS — Z0000 Encounter for general adult medical examination without abnormal findings: Secondary | ICD-10-CM | POA: Diagnosis not present

## 2017-09-21 DIAGNOSIS — D329 Benign neoplasm of meninges, unspecified: Secondary | ICD-10-CM | POA: Insufficient documentation

## 2017-09-21 DIAGNOSIS — I10 Essential (primary) hypertension: Secondary | ICD-10-CM

## 2017-09-21 DIAGNOSIS — E1142 Type 2 diabetes mellitus with diabetic polyneuropathy: Secondary | ICD-10-CM | POA: Diagnosis not present

## 2017-09-21 DIAGNOSIS — Z7189 Other specified counseling: Secondary | ICD-10-CM

## 2017-09-21 DIAGNOSIS — G939 Disorder of brain, unspecified: Secondary | ICD-10-CM

## 2017-09-21 DIAGNOSIS — M159 Polyosteoarthritis, unspecified: Secondary | ICD-10-CM

## 2017-09-21 DIAGNOSIS — G9389 Other specified disorders of brain: Secondary | ICD-10-CM

## 2017-09-21 DIAGNOSIS — I480 Paroxysmal atrial fibrillation: Secondary | ICD-10-CM

## 2017-09-21 LAB — HM DIABETES FOOT EXAM

## 2017-09-21 NOTE — Assessment & Plan Note (Signed)
BP Readings from Last 3 Encounters:  09/21/17 124/80  08/30/17 122/62  04/06/17 136/74   No problems with change to losartan

## 2017-09-21 NOTE — Progress Notes (Signed)
Subjective:    Patient ID: Angela Bentley, female    DOB: 06/27/1941, 76 y.o.   MRN: [id number]  HPI Here for Medicare wellness visit and follow up of chronic health conditions Reviewed form and advanced directives Reviewed other doctors Occasional drink of alcohol No tobacco Does do regular exercise--but not as much with her balance issues Has been preoccupied with her husband's health issues No falls No depression or anhedonia Vision is okay.  Mild hearing loss, just in noisy room---but hearing eval okay at ENT Independent with instrumental ADLs Has noticed some memory problems  She still has balance issues she relates to her brain on left side Therapy was helpful--"I don't walk like a duck anymore" Hard to describe the sensation--not vertigo  No falls Couldn't swim due to the symptoms Did find incidental brain mass---decided to hold off on action, will just be getting a follow up MRI  Having some ankle swelling (better with low salt diet) Some heel pain--relates it to diabetes maybe Doesn't think it is plantar fasciitis--but she describes classic symptoms Discussed arch support, etc  New diagnosis of skin cancer on right hand Having Moh's surgery soon  Not really checking sugars--only occasionally No hypoglycemia No major pain in feet  Did have a palpitation 2 weeks ago---first in nearly a year No obvious reason Seen by RN--confirmed irregular rhythm and increased rate Lasted ~ 2 hours and then resolved No chest pain or SOB No dizziness or syncope  Current Outpatient Medications on File Prior to Visit  Medication Sig Dispense Refill  . Ascorbic Acid (VITAMIN C) 100 MG CHEW Chew 200 mg by mouth daily as needed (immune system support).    . celecoxib (CELEBREX) 200 MG capsule Take 1 capsule (200 mg total) by mouth as needed for mild pain or moderate pain. 30 capsule 1  . fluticasone (VERAMYST) 27.5 MCG/SPRAY nasal spray Place 2 sprays into the nose daily as needed  for allergies.     Marland Kitchen glimepiride (AMARYL) 1 MG tablet TAKE 1 TABLET EVERY DAY WITH BREAKFAST 90 tablet 3  . HYDROcodone-homatropine (HYCODAN) 5-1.5 MG/5ML syrup Take 5 mLs by mouth at bedtime as needed for cough. 60 mL 0  . losartan-hydrochlorothiazide (HYZAAR) 100-12.5 MG tablet Take 1 tablet by mouth daily. 90 tablet 3  . metoprolol succinate (TOPROL-XL) 25 MG 24 hr tablet Take 1 tablet (25 mg total) by mouth daily. 90 tablet 3  . Multiple Vitamin (MULTIVITAMIN) tablet Take 1 tablet by mouth daily.    . pioglitazone (ACTOS) 45 MG tablet Take 1 tablet (45 mg total) by mouth daily. 90 tablet 3  . rivaroxaban (XARELTO) 20 MG TABS tablet Take 1 tablet (20 mg total) by mouth daily with supper. 90 tablet 3   No current facility-administered medications on file prior to visit.     Allergies  Allergen Reactions  . Penicillins     Has patient had a PCN reaction causing immediate rash, facial/tongue/throat swelling, SOB or lightheadedness with hypotension: Yes Has patient had a PCN reaction causing severe rash involving mucus membranes or skin necrosis: Yes Has patient had a PCN reaction that required hospitalization No Has patient had a PCN reaction occurring within the last 10 years: No If all of the above answers are "NO", then may proceed with Cephalosporin use.    . Sulfa Antibiotics     Rash/itching  . Azithromycin Palpitations    Past Medical History:  Diagnosis Date  . Brain tumor ([place])    a. Left intraventricular tumor ->stable  by 06/2017 MRI. Followed @ [facility].  . Gait disturbance    a. Unsteady on feet/balance difficulty.  . Generalized osteoarthritis of multiple sites   . GERD (gastroesophageal reflux disease)   . History of DVT (deep vein thrombosis) 2016   estrogen and airflights. Rx with xarelto for 3 months  . History of stress test    a. 11/2016 ETT: No ST/T changes.  Performed to assess PAC/PVC burden with activity ->No ectopy noted.  . Hypertension   . Hyperthyroidism     treated briefly in college  . Insomnia   . Obesity   . PAF (paroxysmal atrial fibrillation) ([place])    a. 12/2016 Event Monitor: brief run of PAF-->CHA2DS2VASc = 5--> Xarelto.  Marland Kitchen Retinal tear of left eye   . Rosacea   . Type 2 diabetes, controlled, with peripheral neuropathy ([place])    a. 11/2016 A1c 7.4.  . Uterine fibroid     Past Surgical History:  Procedure Laterality Date  . CARPAL TUNNEL RELEASE Right    Dr Burney Gauze  . CATARACT EXTRACTION    . RETINAL TEAR REPAIR CRYOTHERAPY     10/09/13, then again 3/15    Family History  Problem Relation Age of Onset  . Diabetes Father   . Pancreatic cancer Father   . Diabetes Other        grandmother    Social History   Socioeconomic History  . Marital status: Married    Spouse name: Not on file  . Number of children: 3  . Years of education: Not on file  . Highest education level: Not on file  Social Needs  . Financial resource strain: Not on file  . Food insecurity - worry: Not on file  . Food insecurity - inability: Not on file  . Transportation needs - medical: Not on file  . Transportation needs - non-medical: Not on file  Occupational History  . Occupation: Non Patent attorney    Comment: [employer] and others  Tobacco Use  . Smoking status: Former Smoker    Last attempt to quit: 09/26/1993    Years since quitting: 24.0  . Smokeless tobacco: Never Used  . Tobacco comment: quit in 1996  Substance and Sexual Activity  . Alcohol use: Yes    Alcohol/week: 0.6 oz    Types: 1 Standard drinks or equivalent per week    Comment: occasional wine  . Drug use: No  . Sexual activity: No    Partners: Male    Comment: husband vasectomy  Other Topics Concern  . Not on file  Social History Narrative   Has living will   Husband is health care POA-- or daughter Izora Gala   Would accept resuscitation attempts   Would not want tube feeds if cognitively unaware   Review of Systems Appetite is fine Weight is up a few pounds Wears seat  belt Not sleeping great--nothing new. Melatonin not clearly helpful. Mostly trouble with middle of the night awakening Bowels are fine. No blood No urinary problems other than odor. No incontinence. Occasional back or joint pains. Off the celebrex for the most part. No heartburn or dysphagia Still some cough and throat irritation. Still not able to sing     Objective:   Physical Exam  Constitutional: She is oriented to person, place, and time. She appears well-developed. No distress.  HENT:  Mouth/Throat: Oropharynx is clear and moist. No oropharyngeal exudate.  Neck: No thyromegaly present.  Cardiovascular: Normal rate, regular rhythm, normal heart sounds and intact  distal pulses. Exam reveals no gallop.  No murmur heard. Pulmonary/Chest: Effort normal and breath sounds normal. No respiratory distress. She has no wheezes. She has no rales.  Abdominal: Soft. She exhibits no distension. There is no tenderness. There is no rebound and no guarding.  Musculoskeletal: She exhibits no edema or tenderness.  Lymphadenopathy:    She has no cervical adenopathy.  Neurological: She is alert and oriented to person, place, and time.  President-- "Angela Bentley, Obama, Bush" [phone] [id number] Recall 3/3  Fairly normal sensation in feet  Skin: No rash noted. No erythema.  No foot lesions  Psychiatric: She has a normal mood and affect. Her behavior is normal.          Assessment & Plan:

## 2017-09-21 NOTE — Assessment & Plan Note (Signed)
Recent paroxysm--but they are rare On metoprolol and xarelto

## 2017-09-21 NOTE — Assessment & Plan Note (Signed)
Lab Results  Component Value Date   HGBA1C 6.5 (H) 08/30/2017   Good control No changes needed Mild foot pain only

## 2017-09-21 NOTE — Assessment & Plan Note (Signed)
See social history 

## 2017-09-21 NOTE — Assessment & Plan Note (Signed)
Discussed it would be okay to use the celebrex once in a while--just not regularly

## 2017-09-21 NOTE — Assessment & Plan Note (Signed)
Large but doesn't seem to be causing the balance issues Having a repeat MRI soon per neurosurgeon

## 2017-09-21 NOTE — Assessment & Plan Note (Signed)
I have personally reviewed the Medicare Annual Wellness questionnaire and have noted 1. The patient's medical and social history 2. Their use of alcohol, tobacco or illicit drugs 3. Their current medications and supplements 4. The patient's functional ability including ADL's, fall risks, home safety risks and hearing or visual             impairment. 5. Diet and physical activities 6. Evidence for depression or mood disorders  The patients weight, height, BMI and visual acuity have been recorded in the chart I have made referrals, counseling and provided education to the patient based review of the above and I have provided the pt with a written personalized care plan for preventive services.  I have provided you with a copy of your personalized plan for preventive services. Please take the time to review along with your updated medication list.  Trying to work on exercise--but limited due to head sensation Trying to eat better Yearly flu vaccine Will consider a last colon in 2020 Last mammogram next year

## 2017-09-26 ENCOUNTER — Other Ambulatory Visit: Payer: Self-pay | Admitting: Internal Medicine

## 2017-09-28 NOTE — Telephone Encounter (Signed)
Last Rx 2017. Last OV 08/2017

## 2017-10-12 ENCOUNTER — Emergency Department: Payer: Medicare Other

## 2017-10-12 ENCOUNTER — Encounter: Payer: Self-pay | Admitting: Emergency Medicine

## 2017-10-12 ENCOUNTER — Emergency Department
Admission: EM | Admit: 2017-10-12 | Discharge: 2017-10-12 | Disposition: A | Discharge status: Home / Self Care | Payer: Medicare Other | Attending: Emergency Medicine | Admitting: Emergency Medicine

## 2017-10-12 DIAGNOSIS — R42 Dizziness and giddiness: Secondary | ICD-10-CM | POA: Diagnosis not present

## 2017-10-12 DIAGNOSIS — M79602 Pain in left arm: Secondary | ICD-10-CM | POA: Insufficient documentation

## 2017-10-12 DIAGNOSIS — E119 Type 2 diabetes mellitus without complications: Secondary | ICD-10-CM | POA: Insufficient documentation

## 2017-10-12 DIAGNOSIS — Z87891 Personal history of nicotine dependence: Secondary | ICD-10-CM | POA: Insufficient documentation

## 2017-10-12 DIAGNOSIS — I10 Essential (primary) hypertension: Secondary | ICD-10-CM | POA: Diagnosis not present

## 2017-10-12 DIAGNOSIS — M7918 Myalgia, other site: Secondary | ICD-10-CM

## 2017-10-12 LAB — BASIC METABOLIC PANEL
Anion gap: 10 (ref 5–15)
BUN: 15 mg/dL (ref 6–20)
CO2: 26 mmol/L (ref 22–32)
Calcium: 9.3 mg/dL (ref 8.9–10.3)
Chloride: 96 mmol/L — ABNORMAL LOW (ref 101–111)
Creatinine, Ser: 0.92 mg/dL (ref 0.44–1.00)
GFR calc Af Amer: >60 mL/min (ref >60)
GFR calc non Af Amer: 58 mL/min — ABNORMAL LOW (ref >60)
Glucose, Bld: 154 mg/dL — ABNORMAL HIGH (ref 65–99)
Potassium: 3.8 mmol/L (ref 3.5–5.1)
Sodium: 132 mmol/L — ABNORMAL LOW (ref 135–145)

## 2017-10-12 LAB — CBC
HCT: 36.2 % (ref 35.0–47.0)
Hemoglobin: 12.3 g/dL (ref 12.0–16.0)
MCH: 29.6 pg (ref 26.0–34.0)
MCHC: 33.9 g/dL (ref 32.0–36.0)
MCV: 87.3 fL (ref 80.0–100.0)
Platelets: 231 10*3/uL (ref 150–440)
RBC: 4.14 MIL/uL (ref 3.80–5.20)
RDW: 13.4 % (ref 11.5–14.5)
WBC: 7.3 10*3/uL (ref 3.6–11.0)

## 2017-10-12 LAB — TROPONIN I: Troponin I: <0.03 ng/mL (ref <0.03)

## 2017-10-12 NOTE — ED Provider Notes (Signed)
[facility] Emergency Department Provider Note       Time seen: ----------------------------------------- 7:02 AM on 10/12/2017 -----------------------------------------   I have reviewed the triage vital signs and the nursing notes.  HISTORY   Chief Complaint Chest Pain    HPI Angela Bentley is a 77 y.o. female with a history of hypertension, hyperthyroidism, paroxysmal atrial fibrillation on Xarelto who presents to the ED for left arm pain that radiates into her neck.  Patient denies any chest pain but was concerned enough to come to the ER for evaluation due to the possible association with her heart.  Patient states she did not think she was having a heart attack but wanted to make sure.  She denies any change in her medications, she states she has been dealing with dizziness intermittently but is not having any currently.  Past Medical History:  Diagnosis Date  . Brain tumor ([place])    a. Left intraventricular tumor ->stable by 06/2017 MRI. Followed @ [facility].  . Gait disturbance    a. Unsteady on feet/balance difficulty.  . Generalized osteoarthritis of multiple sites   . GERD (gastroesophageal reflux disease)   . History of DVT (deep vein thrombosis) 2016   estrogen and airflights. Rx with xarelto for 3 months  . History of stress test    a. 11/2016 ETT: No ST/T changes.  Performed to assess PAC/PVC burden with activity ->No ectopy noted.  . Hypertension   . Hyperthyroidism    treated briefly in college  . Insomnia   . Obesity   . PAF (paroxysmal atrial fibrillation) ([place])    a. 12/2016 Event Monitor: brief run of PAF-->CHA2DS2VASc = 5--> Xarelto.  Marland Kitchen Retinal tear of left eye   . Rosacea   . Type 2 diabetes, controlled, with peripheral neuropathy ([place])    a. 11/2016 A1c 7.4.  . Uterine fibroid     Patient Active Problem List   Diagnosis Date Noted  . Brain mass 09/21/2017  . Dizziness 04/06/2017  . PAF (paroxysmal atrial fibrillation) ([place])  02/03/2017  . DOE (dyspnea on exertion) 12/01/2016  . Acute right-sided low back pain without sciatica 08/26/2016  . Preventative health care 06/02/2016  . Advance directive discussed with patient 06/02/2016  . Achilles tendonitis 03/11/2016  . Hypertension   . History of DVT (deep vein thrombosis)   . Type 2 diabetes, controlled, with peripheral neuropathy ([place])   . Generalized osteoarthritis of multiple sites   . GERD (gastroesophageal reflux disease)   . Hyperthyroidism     Past Surgical History:  Procedure Laterality Date  . CARPAL TUNNEL RELEASE Right    Dr Burney Gauze  . CATARACT EXTRACTION    . RETINAL TEAR REPAIR CRYOTHERAPY     10/09/13, then again 3/15    Allergies Penicillins; Sulfa antibiotics; and Azithromycin  Social History Social History   Tobacco Use  . Smoking status: Former Smoker    Last attempt to quit: 09/26/1993    Years since quitting: 24.0  . Smokeless tobacco: Never Used  . Tobacco comment: quit in 1996  Substance Use Topics  . Alcohol use: Yes    Alcohol/week: 0.6 oz    Types: 1 Standard drinks or equivalent per week    Comment: occasional wine  . Drug use: No    Review of Systems Constitutional: Negative for fever. Cardiovascular: Negative for chest pain. Respiratory: Negative for shortness of breath. Gastrointestinal: Negative for abdominal pain, vomiting and diarrhea. Musculoskeletal: Positive for left arm pain and neck pain Skin: Negative  for rash. Neurological: Negative for headaches, focal weakness or numbness.  Positive for dizziness  All systems negative/normal/unremarkable except as stated in the HPI  ____________________________________________   PHYSICAL EXAM:  VITAL SIGNS: ED Triage Vitals  Enc Vitals Group     BP 10/12/17 0615 (!) 149/80     Pulse Rate 10/12/17 0615 76     Resp 10/12/17 0615 17     Temp 10/12/17 0615 97.7 F (36.5 C)     Temp Source 10/12/17 0615 Oral     SpO2 10/12/17 0615 99 %     Weight 10/12/17  0616 210 lb (95.3 kg)     Height --      Head Circumference --      Peak Flow --      Pain Score --      Pain Loc --      Pain Edu? --      Excl. in [place]? --     Constitutional: Alert and oriented. Well appearing and in no distress. Eyes: Conjunctivae are normal. Normal extraocular movements. ENT   Head: Normocephalic and atraumatic.   Nose: No congestion/rhinnorhea.   Mouth/Throat: Mucous membranes are moist.   Neck: No stridor. Cardiovascular: Normal rate, regular rhythm. No murmurs, rubs, or gallops. Respiratory: Normal respiratory effort without tachypnea nor retractions. Breath sounds are clear and equal bilaterally. No wheezes/rales/rhonchi. Gastrointestinal: Soft and nontender. Normal bowel sounds Musculoskeletal: Nontender with normal range of motion in extremities. No lower extremity tenderness nor edema.  There is tenderness in the left trapezius muscle group and mild pain with range of motion of the shoulder. Neurologic:  Normal speech and language. No gross focal neurologic deficits are appreciated.  Skin:  Skin is warm, dry and intact. No rash noted. Psychiatric: Mood and affect are normal. Speech and behavior are normal.  ____________________________________________  EKG: Interpreted by me.  Sinus rhythm the rate of 70 bpm, normal PR was normal, normal QRS, normal QT.  ____________________________________________  ED COURSE:  As part of my medical decision making, I reviewed the following data within the [place] History obtained from family if available, nursing notes, old chart and ekg, as well as notes from prior ED visits. Patient presented for neck pain and arm pain that appears to be musculoskeletal, we will assess with labs and imaging as indicated at this time.   Procedures ____________________________________________   LABS (pertinent positives/negatives)  Labs Reviewed  BASIC METABOLIC PANEL - Abnormal; Notable for the  following components:      Result Value   Sodium 132 (*)    Chloride 96 (*)    Glucose, Bld 154 (*)    GFR calc non Af Amer 58 (*)    All other components within normal limits  CBC  TROPONIN I    RADIOLOGY Images were viewed by me  Chest x-ray is normal  ____________________________________________  DIFFERENTIAL DIAGNOSIS   Musculo-skeletal pain, unstable angina, PE, MI, pneumothorax, dissection  FINAL ASSESSMENT AND PLAN  Musculoskeletal pain   Plan: Patient had presented for left-sided shoulder pain which appears to be musculoskeletal in origin and reproducible to touch. Patient's labs are reassuring. Patient's imaging was also reassuring.  I will encourage heating pad, massage and stretching.  She may require muscle relaxants but overall appears stable for outpatient follow-up.   Earleen Newport, MD   Note: This note was generated in part or whole with voice recognition software. Voice recognition is usually quite accurate but there are transcription errors that can  and very often do occur. I apologize for any typographical errors that were not detected and corrected.     Earleen Newport, MD 10/12/17 [phone]

## 2017-10-12 NOTE — ED Notes (Signed)
Pt alert and oriented X4, active, cooperative, pt in NAD. RR even and unlabored, color WNL.  Pt informed to return if any life threatening symptoms occur.  Discharge and followup instructions reviewed. Left with husband, all of belongings with patient.

## 2017-10-12 NOTE — ED Triage Notes (Signed)
Pt c/o left arm pain that radiates into the left side of neck. Pt denies chest pain but sts, "I don't think Im having a heart attack but I know the signs and I want to make sure." Pt is A&O x4 with no difficulty breathing. Pt denies n/v. Pt has hx of A-Fib.

## 2017-10-18 ENCOUNTER — Encounter: Payer: Self-pay | Admitting: Internal Medicine

## 2017-10-19 DIAGNOSIS — C44622 Squamous cell carcinoma of skin of right upper limb, including shoulder: Secondary | ICD-10-CM | POA: Diagnosis not present

## 2017-10-19 DIAGNOSIS — L718 Other rosacea: Secondary | ICD-10-CM | POA: Diagnosis not present

## 2017-10-19 DIAGNOSIS — L570 Actinic keratosis: Secondary | ICD-10-CM | POA: Diagnosis not present

## 2017-10-19 DIAGNOSIS — L821 Other seborrheic keratosis: Secondary | ICD-10-CM | POA: Diagnosis not present

## 2017-10-26 ENCOUNTER — Other Ambulatory Visit: Payer: Self-pay | Admitting: Internal Medicine

## 2017-10-26 NOTE — Telephone Encounter (Signed)
Last filled 09/28/17... Please advise if okay for pt to continue #30

## 2017-10-30 DIAGNOSIS — G939 Disorder of brain, unspecified: Secondary | ICD-10-CM | POA: Diagnosis not present

## 2017-10-30 DIAGNOSIS — M5031 Other cervical disc degeneration,  high cervical region: Secondary | ICD-10-CM | POA: Diagnosis not present

## 2017-10-30 DIAGNOSIS — M47812 Spondylosis without myelopathy or radiculopathy, cervical region: Secondary | ICD-10-CM | POA: Diagnosis not present

## 2017-11-02 ENCOUNTER — Encounter: Payer: Self-pay | Admitting: Internal Medicine

## 2017-11-07 DIAGNOSIS — G939 Disorder of brain, unspecified: Secondary | ICD-10-CM | POA: Diagnosis not present

## 2017-11-07 DIAGNOSIS — R51 Headache: Secondary | ICD-10-CM | POA: Diagnosis not present

## 2017-11-14 DIAGNOSIS — R700 Elevated erythrocyte sedimentation rate: Secondary | ICD-10-CM | POA: Diagnosis not present

## 2017-11-14 DIAGNOSIS — E559 Vitamin D deficiency, unspecified: Secondary | ICD-10-CM | POA: Diagnosis not present

## 2017-11-14 DIAGNOSIS — R270 Ataxia, unspecified: Secondary | ICD-10-CM | POA: Diagnosis not present

## 2017-11-14 DIAGNOSIS — R519 Headache, unspecified: Secondary | ICD-10-CM | POA: Insufficient documentation

## 2017-11-14 DIAGNOSIS — Z13820 Encounter for screening for osteoporosis: Secondary | ICD-10-CM | POA: Diagnosis not present

## 2017-11-14 DIAGNOSIS — R51 Headache: Secondary | ICD-10-CM | POA: Diagnosis not present

## 2017-11-14 DIAGNOSIS — R930 Abnormal findings on diagnostic imaging of skull and head, not elsewhere classified: Secondary | ICD-10-CM | POA: Insufficient documentation

## 2017-11-16 DIAGNOSIS — M4802 Spinal stenosis, cervical region: Secondary | ICD-10-CM | POA: Diagnosis not present

## 2017-11-16 DIAGNOSIS — Z79899 Other long term (current) drug therapy: Secondary | ICD-10-CM | POA: Diagnosis not present

## 2017-11-16 DIAGNOSIS — Z87891 Personal history of nicotine dependence: Secondary | ICD-10-CM | POA: Diagnosis not present

## 2017-11-16 DIAGNOSIS — Z7952 Long term (current) use of systemic steroids: Secondary | ICD-10-CM | POA: Diagnosis not present

## 2017-11-16 DIAGNOSIS — E119 Type 2 diabetes mellitus without complications: Secondary | ICD-10-CM | POA: Diagnosis not present

## 2017-11-16 DIAGNOSIS — Z7984 Long term (current) use of oral hypoglycemic drugs: Secondary | ICD-10-CM | POA: Diagnosis not present

## 2017-11-16 DIAGNOSIS — I672 Cerebral atherosclerosis: Secondary | ICD-10-CM | POA: Diagnosis not present

## 2017-11-16 DIAGNOSIS — Z7901 Long term (current) use of anticoagulants: Secondary | ICD-10-CM | POA: Diagnosis not present

## 2017-11-16 DIAGNOSIS — I708 Atherosclerosis of other arteries: Secondary | ICD-10-CM | POA: Diagnosis not present

## 2017-11-16 DIAGNOSIS — M316 Other giant cell arteritis: Secondary | ICD-10-CM | POA: Diagnosis not present

## 2017-11-16 DIAGNOSIS — I10 Essential (primary) hypertension: Secondary | ICD-10-CM | POA: Diagnosis not present

## 2017-11-19 DIAGNOSIS — R739 Hyperglycemia, unspecified: Secondary | ICD-10-CM | POA: Diagnosis not present

## 2017-11-20 ENCOUNTER — Encounter: Payer: Self-pay | Admitting: Internal Medicine

## 2017-11-28 DIAGNOSIS — R930 Abnormal findings on diagnostic imaging of skull and head, not elsewhere classified: Secondary | ICD-10-CM | POA: Diagnosis not present

## 2017-11-28 DIAGNOSIS — H9313 Tinnitus, bilateral: Secondary | ICD-10-CM | POA: Diagnosis not present

## 2017-11-28 DIAGNOSIS — R51 Headache: Secondary | ICD-10-CM | POA: Diagnosis not present

## 2017-12-13 DIAGNOSIS — H5310 Unspecified subjective visual disturbances: Secondary | ICD-10-CM | POA: Diagnosis not present

## 2018-01-03 DIAGNOSIS — L578 Other skin changes due to chronic exposure to nonionizing radiation: Secondary | ICD-10-CM | POA: Diagnosis not present

## 2018-01-03 DIAGNOSIS — L814 Other melanin hyperpigmentation: Secondary | ICD-10-CM | POA: Diagnosis not present

## 2018-01-03 DIAGNOSIS — C4492 Squamous cell carcinoma of skin, unspecified: Secondary | ICD-10-CM | POA: Diagnosis not present

## 2018-01-03 DIAGNOSIS — C44622 Squamous cell carcinoma of skin of right upper limb, including shoulder: Secondary | ICD-10-CM | POA: Diagnosis not present

## 2018-01-03 DIAGNOSIS — L988 Other specified disorders of the skin and subcutaneous tissue: Secondary | ICD-10-CM | POA: Diagnosis not present

## 2018-02-16 DIAGNOSIS — H1859 Other hereditary corneal dystrophies: Secondary | ICD-10-CM | POA: Diagnosis not present

## 2018-02-16 DIAGNOSIS — H52202 Unspecified astigmatism, left eye: Secondary | ICD-10-CM | POA: Diagnosis not present

## 2018-02-16 DIAGNOSIS — E119 Type 2 diabetes mellitus without complications: Secondary | ICD-10-CM | POA: Diagnosis not present

## 2018-02-16 DIAGNOSIS — H43813 Vitreous degeneration, bilateral: Secondary | ICD-10-CM | POA: Diagnosis not present

## 2018-02-16 LAB — HM DIABETES EYE EXAM

## 2018-02-27 ENCOUNTER — Encounter: Payer: Self-pay | Admitting: Internal Medicine

## 2018-03-10 NOTE — Progress Notes (Signed)
Cardiology Office Note  Date:  03/13/2018   ID:  Angela Bentley, DOB 1941/01/14, MRN [id number]  PCP:  [name], MD   Chief Complaint  Patient presents with  . other    6 month f/u c/o unbalance causing her to be unable to get any exercise in. Meds reviewed verbally with pt.    HPI:  77 y/o ? with a history of  paroxysmal atrial fibrillation,  hypertension,  diabetes,  GERD,  persistent gait imbalance,  left intraventricular brain tumor,  prior history of DVT.   Who presents for f/u of her PAF   diagnosed with paroxysmal atrial fibrillation after complaining of palpitations and wearing an event monitor which showed a brief run of atrial fibrillation.  Xarelto  CHA2DS2VASc of 5.    No atrial fib by symptoms Sedentary lifestyle Chronic dizziness, balance issues with no falls  History of left intraventricular brain tumor.    followed by neurosurgery.   not currently interested in resection  Periodic MRIs  Reports having worsening ankle swelling  EKG personally reviewed by myself on todays visit Shows normal sinus rhythm with rate 73 bpm no significant ST or T-wave changes  PMH:   has a past medical history of Brain tumor ([place]), Gait disturbance, Generalized osteoarthritis of multiple sites, GERD (gastroesophageal reflux disease), History of DVT (deep vein thrombosis) (2016), History of stress test, Hypertension, Hyperthyroidism, Insomnia, Obesity, PAF (paroxysmal atrial fibrillation) ([place]), Retinal tear of left eye, Rosacea, Type 2 diabetes, controlled, with peripheral neuropathy ([place]), and Uterine fibroid.  PSH:    Past Surgical History:  Procedure Laterality Date  . CARPAL TUNNEL RELEASE Right    Dr Burney Gauze  . CATARACT EXTRACTION    . RETINAL TEAR REPAIR CRYOTHERAPY     10/09/13, then again 3/15    Current Outpatient Medications  Medication Sig Dispense Refill  . Ascorbic Acid (VITAMIN C) 100 MG CHEW Chew 200 mg by mouth daily as needed (immune system  support).    . celecoxib (CELEBREX) 200 MG capsule TAKE 1 CAPSULE BY MOUTH ONCE DAILY AS NEEDED FOR MILD TO MODERATE PAIN 30 capsule 5  . fluticasone (VERAMYST) 27.5 MCG/SPRAY nasal spray Place 2 sprays into the nose daily as needed for allergies.     Marland Kitchen glimepiride (AMARYL) 1 MG tablet TAKE 1 TABLET EVERY DAY WITH BREAKFAST 90 tablet 3  . HYDROcodone-homatropine (HYCODAN) 5-1.5 MG/5ML syrup Take 5 mLs by mouth at bedtime as needed for cough. 60 mL 0  . losartan-hydrochlorothiazide (HYZAAR) 100-12.5 MG tablet Take 1 tablet by mouth daily. 90 tablet 3  . metoprolol succinate (TOPROL-XL) 25 MG 24 hr tablet Take 1 tablet (25 mg total) by mouth daily. 90 tablet 3  . Multiple Vitamin (MULTIVITAMIN) tablet Take 1 tablet by mouth daily.    . pioglitazone (ACTOS) 45 MG tablet Take 1 tablet (45 mg total) by mouth daily. 90 tablet 3  . rivaroxaban (XARELTO) 20 MG TABS tablet Take 1 tablet (20 mg total) by mouth daily with supper. 90 tablet 3   No current facility-administered medications for this visit.      Allergies:   Penicillins; Sulfa antibiotics; and Azithromycin   Social History:  The patient  reports that she quit smoking about 24 years ago. She has never used smokeless tobacco. She reports that she drinks about 0.6 oz of alcohol per week. She reports that she does not use drugs.   Family History:   family history includes Diabetes in her father and other; Pancreatic cancer in  her father.    Review of Systems: Review of Systems  Constitutional: Negative.   Respiratory: Negative.   Cardiovascular: Negative.   Gastrointestinal: Negative.   Musculoskeletal: Negative.   Neurological: Negative.   Psychiatric/Behavioral: Negative.   All other systems reviewed and are negative.    PHYSICAL EXAM: VS:  BP (!) 152/70 (BP Location: Left Arm, Patient Position: Sitting, Cuff Size: Normal)   Pulse 73   Ht 5\' 6"  (1.676 m)   Wt 207 lb 4 oz (94 kg)   LMP 09/26/1997   BMI 33.45 kg/m  , BMI Body  mass index is 33.45 kg/m. Constitutional:  oriented to person, place, and time. No distress.  HENT:  Head: Normocephalic and atraumatic.  Eyes:  no discharge. No scleral icterus.  Neck: Normal range of motion. Neck supple. No JVD present.  Cardiovascular: Normal rate, regular rhythm, normal heart sounds and intact distal pulses. Exam reveals no gallop and no friction rub. No edema No murmur heard. Pulmonary/Chest: Effort normal and breath sounds normal. No stridor. No respiratory distress.  no wheezes.  no rales.  no tenderness.  Abdominal: Soft.  no distension.  no tenderness.  Musculoskeletal: Normal range of motion.  no  tenderness or deformity.  Neurological:  normal muscle tone. Coordination normal. No atrophy Skin: Skin is warm and dry. No rash noted. not diaphoretic.  Psychiatric:  normal mood and affect. behavior is normal. Thought content normal.   Recent Labs: 10/12/2017: BUN 15; Creatinine, Ser 0.92; Hemoglobin 12.3; Platelets 231; Potassium 3.8; Sodium 132    Lipid Panel Lab Results  Component Value Date   CHOL 192 06/02/2016   HDL 52.80 06/02/2016   LDLCALC 106 (H) 06/02/2016   TRIG 166.0 (H) 06/02/2016      Wt Readings from Last 3 Encounters:  03/13/18 207 lb 4 oz (94 kg)  10/12/17 210 lb (95.3 kg)  09/21/17 210 lb (95.3 kg)       ASSESSMENT AND PLAN:  PAF (paroxysmal atrial fibrillation) ([place]) - Plan: EKG 12-Lead No sx no changes to her medications She could take extra metoprolol as needed for breakthrough palpitations  Brain mass Followed by neurosurgery with periodic MRIs Previously not interested in resection  Type 2 diabetes, controlled, with peripheral neuropathy ([place]) We have encouraged continued exercise, careful diet management in an effort to lose weight.  History of DVT (deep vein thrombosis) On anticoagulation  Essential hypertension - Plan: EKG 12-Lead Blood pressure is well controlled on today's visit. No changes made to the  medications.  Gastroesophageal reflux disease without esophagitis Denies having any active symptoms  Leg swelling Recommended she take Lasix with potassium as needed for ankle swelling Suggested she take this sparingly Suspect component of fluid retention mixed with venous insufficiency  Disposition:   F/U  12 months   Total encounter time more than 25 minutes  Greater than 50% was spent in counseling and coordination of care with the patient    Orders Placed This Encounter  Procedures  . EKG 12-Lead     Signed, Esmond Plants, M.D., Ph.D. 03/13/2018  [place], [place]

## 2018-03-13 ENCOUNTER — Ambulatory Visit (INDEPENDENT_AMBULATORY_CARE_PROVIDER_SITE_OTHER): Payer: Medicare Other | Admitting: Cardiovascular Disease

## 2018-03-13 ENCOUNTER — Encounter: Payer: Self-pay | Admitting: Cardiovascular Disease

## 2018-03-13 VITALS — BP 152/70 | HR 73 | Ht 66.0 in | Wt 207.2 lb

## 2018-03-13 DIAGNOSIS — I480 Paroxysmal atrial fibrillation: Secondary | ICD-10-CM | POA: Diagnosis not present

## 2018-03-13 DIAGNOSIS — K219 Gastro-esophageal reflux disease without esophagitis: Secondary | ICD-10-CM | POA: Diagnosis not present

## 2018-03-13 DIAGNOSIS — E1142 Type 2 diabetes mellitus with diabetic polyneuropathy: Secondary | ICD-10-CM

## 2018-03-13 DIAGNOSIS — G939 Disorder of brain, unspecified: Secondary | ICD-10-CM

## 2018-03-13 DIAGNOSIS — I10 Essential (primary) hypertension: Secondary | ICD-10-CM | POA: Diagnosis not present

## 2018-03-13 DIAGNOSIS — Z86718 Personal history of other venous thrombosis and embolism: Secondary | ICD-10-CM

## 2018-03-13 DIAGNOSIS — G9389 Other specified disorders of brain: Secondary | ICD-10-CM

## 2018-03-13 MED ORDER — POTASSIUM CHLORIDE CRYS ER 20 MEQ PO TBCR: 20.0000 meq | EXTENDED_RELEASE_TABLET | Freq: Every day | ORAL | 3 refills | Status: DC | PRN

## 2018-03-13 MED ORDER — FUROSEMIDE 20 MG PO TABS: 20.0000 mg | ORAL_TABLET | Freq: Every day | ORAL | 3 refills | Status: DC | PRN

## 2018-03-13 NOTE — Patient Instructions (Signed)
Medication Instructions:   Please try lasix 20 mg as needed for leg swelling Take with potassium  Labwork:  No new labs needed  Testing/Procedures:  No further testing at this time   Follow-Up: It was a pleasure seeing you in the office today. Please call us if you have new issues that need to be addressed before your next appt.  [phone]  Your physician wants you to follow-up in: 12 months.  You will receive a reminder letter in the mail two months in advance. If you don't receive a letter, please call our office to schedule the follow-up appointment.  If you need a refill on your cardiac medications before your next appointment, please call your pharmacy.  For educational health videos Log in to : [email or web address] Or : [email or web address], password : [email or web address]

## 2018-03-14 DIAGNOSIS — L814 Other melanin hyperpigmentation: Secondary | ICD-10-CM | POA: Diagnosis not present

## 2018-03-14 DIAGNOSIS — L910 Hypertrophic scar: Secondary | ICD-10-CM | POA: Diagnosis not present

## 2018-03-14 DIAGNOSIS — L821 Other seborrheic keratosis: Secondary | ICD-10-CM | POA: Diagnosis not present

## 2018-03-23 ENCOUNTER — Encounter: Payer: Self-pay | Admitting: Internal Medicine

## 2018-03-23 ENCOUNTER — Ambulatory Visit (INDEPENDENT_AMBULATORY_CARE_PROVIDER_SITE_OTHER): Payer: Medicare Other | Admitting: Internal Medicine

## 2018-03-23 VITALS — BP 130/70 | HR 69 | Temp 97.7°F | Ht 66.0 in | Wt 208.0 lb

## 2018-03-23 DIAGNOSIS — I82409 Acute embolism and thrombosis of unspecified deep veins of unspecified lower extremity: Secondary | ICD-10-CM | POA: Diagnosis not present

## 2018-03-23 DIAGNOSIS — G939 Disorder of brain, unspecified: Secondary | ICD-10-CM

## 2018-03-23 DIAGNOSIS — E1142 Type 2 diabetes mellitus with diabetic polyneuropathy: Secondary | ICD-10-CM | POA: Diagnosis not present

## 2018-03-23 DIAGNOSIS — G9389 Other specified disorders of brain: Secondary | ICD-10-CM

## 2018-03-23 DIAGNOSIS — R42 Dizziness and giddiness: Secondary | ICD-10-CM | POA: Diagnosis not present

## 2018-03-23 DIAGNOSIS — I480 Paroxysmal atrial fibrillation: Secondary | ICD-10-CM

## 2018-03-23 LAB — POCT GLYCOSYLATED HEMOGLOBIN (HGB A1C): Hemoglobin A1C: 6.9 % — AB (ref 4.0–5.6)

## 2018-03-23 MED ORDER — PIOGLITAZONE HCL 45 MG PO TABS: 45.0000 mg | ORAL_TABLET | Freq: Every day | ORAL | 3 refills | Status: DC

## 2018-03-23 MED ORDER — LOSARTAN POTASSIUM-HCTZ 100-12.5 MG PO TABS: 1.0000 | ORAL_TABLET | Freq: Every day | ORAL | 3 refills | Status: DC

## 2018-03-23 MED ORDER — METOPROLOL SUCCINATE ER 25 MG PO TB24: 25.0000 mg | ORAL_TABLET | Freq: Every day | ORAL | 3 refills | Status: DC

## 2018-03-23 MED ORDER — RIVAROXABAN 20 MG PO TABS: 20.0000 mg | ORAL_TABLET | Freq: Every day | ORAL | 3 refills | Status: DC

## 2018-03-23 MED ORDER — GLIMEPIRIDE 1 MG PO TABS: ORAL_TABLET | ORAL | 3 refills | Status: DC

## 2018-03-23 NOTE — Progress Notes (Signed)
Subjective:    Patient ID: Angela Bentley, female    DOB: October 29, 1940, 77 y.o.   MRN: [id number]  HPI Here for follow up of diabetes and other chronic health conditions  Still has the dizziness --mostly an issue for her walking Therapy helped  Sugars high with the empiric prednisone Temporal artery biopsy was negative Took 3 weeks for it to settle down---used extra glimepiride for a while Not checking sugars Some swelling in ankles and toes---Dr Rockey Situ did give furosemide to try (no sig diuresis--but toes felt better) Does have abnormal sensation in toes but not really painful  No chest pain Hasn't felt any atrial fib---but does have extra beats at night Some anxiety at night---trouble getting back to sleep No major mood issues Has had 2 DVTs---both after flying  Current Outpatient Medications on File Prior to Visit  Medication Sig Dispense Refill  . Ascorbic Acid (VITAMIN C) 100 MG CHEW Chew 200 mg by mouth daily as needed (immune system support).    . celecoxib (CELEBREX) 200 MG capsule TAKE 1 CAPSULE BY MOUTH ONCE DAILY AS NEEDED FOR MILD TO MODERATE PAIN 30 capsule 5  . fluticasone (VERAMYST) 27.5 MCG/SPRAY nasal spray Place 2 sprays into the nose daily as needed for allergies.     . furosemide (LASIX) 20 MG tablet Take 1 tablet (20 mg total) by mouth daily as needed. 30 tablet 3  . glimepiride (AMARYL) 1 MG tablet TAKE 1 TABLET EVERY DAY WITH BREAKFAST 90 tablet 3  . losartan-hydrochlorothiazide (HYZAAR) 100-12.5 MG tablet Take 1 tablet by mouth daily. 90 tablet 3  . metoprolol succinate (TOPROL-XL) 25 MG 24 hr tablet Take 1 tablet (25 mg total) by mouth daily. 90 tablet 3  . Multiple Vitamin (MULTIVITAMIN) tablet Take 1 tablet by mouth daily.    . pioglitazone (ACTOS) 45 MG tablet Take 1 tablet (45 mg total) by mouth daily. 90 tablet 3  . potassium chloride SA (K-DUR,KLOR-CON) 20 MEQ tablet Take 1 tablet (20 mEq total) by mouth daily as needed. 30 tablet 3  . rivaroxaban  (XARELTO) 20 MG TABS tablet Take 1 tablet (20 mg total) by mouth daily with supper. 90 tablet 3   No current facility-administered medications on file prior to visit.     Allergies  Allergen Reactions  . Penicillins     Has patient had a PCN reaction causing immediate rash, facial/tongue/throat swelling, SOB or lightheadedness with hypotension: Yes Has patient had a PCN reaction causing severe rash involving mucus membranes or skin necrosis: Yes Has patient had a PCN reaction that required hospitalization No Has patient had a PCN reaction occurring within the last 10 years: No If all of the above answers are "NO", then may proceed with Cephalosporin use.    . Sulfa Antibiotics     Rash/itching  . Azithromycin Palpitations    Past Medical History:  Diagnosis Date  . Brain tumor ([place])    a. Left intraventricular tumor ->stable by 06/2017 MRI. Followed @ [facility].  . Gait disturbance    a. Unsteady on feet/balance difficulty.  . Generalized osteoarthritis of multiple sites   . GERD (gastroesophageal reflux disease)   . History of DVT (deep vein thrombosis) 2016   estrogen and airflights. Rx with xarelto for 3 months  . History of stress test    a. 11/2016 ETT: No ST/T changes.  Performed to assess PAC/PVC burden with activity ->No ectopy noted.  . Hypertension   . Hyperthyroidism    treated briefly in college  .  Insomnia   . Obesity   . PAF (paroxysmal atrial fibrillation) ([place])    a. 12/2016 Event Monitor: brief run of PAF-->CHA2DS2VASc = 5--> Xarelto.  Marland Kitchen Retinal tear of left eye   . Rosacea   . Type 2 diabetes, controlled, with peripheral neuropathy ([place])    a. 11/2016 A1c 7.4.  . Uterine fibroid     Past Surgical History:  Procedure Laterality Date  . CARPAL TUNNEL RELEASE Right    Dr Burney Gauze  . CATARACT EXTRACTION    . RETINAL TEAR REPAIR CRYOTHERAPY     10/09/13, then again 3/15    Family History  Problem Relation Age of Onset  . Diabetes Father   . Pancreatic  cancer Father   . Diabetes Other        grandmother    Social History   Socioeconomic History  . Marital status: Married    Spouse name: Not on file  . Number of children: 3  . Years of education: Not on file  . Highest education level: Not on file  Occupational History  . Occupation: Non Patent attorney    Comment: [employer] and others  Social Needs  . Financial resource strain: Not on file  . Food insecurity:    Worry: Not on file    Inability: Not on file  . Transportation needs:    Medical: Not on file    Non-medical: Not on file  Tobacco Use  . Smoking status: Former Smoker    Last attempt to quit: 09/26/1993    Years since quitting: 24.5  . Smokeless tobacco: Never Used  . Tobacco comment: quit in 1996  Substance and Sexual Activity  . Alcohol use: Yes    Alcohol/week: 0.6 oz    Types: 1 Standard drinks or equivalent per week    Comment: occasional wine  . Drug use: No  . Sexual activity: Never    Partners: Male    Comment: husband vasectomy  Lifestyle  . Physical activity:    Days per week: Not on file    Minutes per session: Not on file  . Stress: Not on file  Relationships  . Social connections:    Talks on phone: Not on file    Gets together: Not on file    Attends religious service: Not on file    Active member of club or organization: Not on file    Attends meetings of clubs or organizations: Not on file    Relationship status: Not on file  . Intimate partner violence:    Fear of current or ex partner: Not on file    Emotionally abused: Not on file    Physically abused: Not on file    Forced sexual activity: Not on file  Other Topics Concern  . Not on file  Social History Narrative   Has living will   Husband is health care POA-- or daughter Izora Gala   Would accept resuscitation attempts   Would not want tube feeds if cognitively unaware   Review of Systems Husband is better--this is good Now nervous about driving--some mild anxiety as child that  seemed to get better    Objective:   Physical Exam  Constitutional: She appears well-developed. No distress.  Neck: No thyromegaly present.  Cardiovascular: Normal rate, regular rhythm, normal heart sounds and intact distal pulses. Exam reveals no gallop.  No murmur heard. Respiratory: Effort normal and breath sounds normal. No respiratory distress. She has no wheezes. She has no rales.  Musculoskeletal:  She exhibits no edema.  Lymphadenopathy:    She has no cervical adenopathy.  Psychiatric: She has a normal mood and affect. Her behavior is normal.           Assessment & Plan:

## 2018-03-23 NOTE — Assessment & Plan Note (Signed)
2 provoked DVTs On xarelto due to atrial fib anyway

## 2018-03-23 NOTE — Assessment & Plan Note (Addendum)
Seems to be well controlled Check A1c  6.9% Fairly stable Urged her to check periodically just in case

## 2018-03-23 NOTE — Addendum Note (Signed)
Addended by: Pilar Grammes on: 03/23/2018 08:50 AM   Modules accepted: Orders

## 2018-03-23 NOTE — Assessment & Plan Note (Signed)
Getting a repeat MRI to check again Likely not related to the dizziness/vestibular symptoms

## 2018-03-23 NOTE — Assessment & Plan Note (Signed)
No sig paroxysms Remains on the xarelto

## 2018-03-26 DIAGNOSIS — D496 Neoplasm of unspecified behavior of brain: Secondary | ICD-10-CM | POA: Diagnosis not present

## 2018-04-02 DIAGNOSIS — R42 Dizziness and giddiness: Secondary | ICD-10-CM | POA: Diagnosis not present

## 2018-04-03 DIAGNOSIS — D329 Benign neoplasm of meninges, unspecified: Secondary | ICD-10-CM | POA: Diagnosis not present

## 2018-04-05 DIAGNOSIS — R42 Dizziness and giddiness: Secondary | ICD-10-CM | POA: Diagnosis not present

## 2018-04-18 DIAGNOSIS — Z1283 Encounter for screening for malignant neoplasm of skin: Secondary | ICD-10-CM | POA: Diagnosis not present

## 2018-04-18 DIAGNOSIS — L812 Freckles: Secondary | ICD-10-CM | POA: Diagnosis not present

## 2018-04-18 DIAGNOSIS — D229 Melanocytic nevi, unspecified: Secondary | ICD-10-CM | POA: Diagnosis not present

## 2018-04-18 DIAGNOSIS — Z85828 Personal history of other malignant neoplasm of skin: Secondary | ICD-10-CM | POA: Diagnosis not present

## 2018-04-18 DIAGNOSIS — L821 Other seborrheic keratosis: Secondary | ICD-10-CM | POA: Diagnosis not present

## 2018-04-18 DIAGNOSIS — L570 Actinic keratosis: Secondary | ICD-10-CM | POA: Diagnosis not present

## 2018-05-24 DIAGNOSIS — L812 Freckles: Secondary | ICD-10-CM | POA: Diagnosis not present

## 2018-05-24 DIAGNOSIS — L570 Actinic keratosis: Secondary | ICD-10-CM | POA: Diagnosis not present

## 2018-05-24 DIAGNOSIS — L821 Other seborrheic keratosis: Secondary | ICD-10-CM | POA: Diagnosis not present

## 2018-06-08 ENCOUNTER — Other Ambulatory Visit: Payer: Self-pay | Admitting: Internal Medicine

## 2018-06-08 MED ORDER — HYDROCODONE-HOMATROPINE 5-1.5 MG/5ML PO SYRP: 5.0000 mL | ORAL_SOLUTION | Freq: Every evening | ORAL | 0 refills | Status: DC | PRN

## 2018-06-12 ENCOUNTER — Ambulatory Visit: Payer: BLUE CROSS/BLUE SHIELD | Admitting: Podiatry

## 2018-06-25 ENCOUNTER — Encounter: Payer: Self-pay | Admitting: Podiatry

## 2018-06-25 ENCOUNTER — Ambulatory Visit (INDEPENDENT_AMBULATORY_CARE_PROVIDER_SITE_OTHER): Payer: Medicare Other | Admitting: Podiatry

## 2018-06-25 ENCOUNTER — Ambulatory Visit (INDEPENDENT_AMBULATORY_CARE_PROVIDER_SITE_OTHER): Payer: Medicare Other

## 2018-06-25 VITALS — BP 140/70 | HR 91 | Resp 16

## 2018-06-25 DIAGNOSIS — M722 Plantar fascial fibromatosis: Secondary | ICD-10-CM

## 2018-06-25 NOTE — Patient Instructions (Signed)

## 2018-06-25 NOTE — Progress Notes (Signed)
Subjective:  Patient ID: Angela Bentley, female    DOB: [birth year],  MRN: [id number] HPI Chief Complaint  Patient presents with  . Foot Pain    Plantar heel bilateral (L>R) - aching x couple months, AM pain, PCP evaluated but getting ready to go on a trip and he recommended waiting on the injection until afterwards, recently after making appt having severe arch pain and swelling in ankle left last few weeks, tried change in shoes and stretching  . Diabetes    Last a1c was 7.0  . New Patient (Initial Visit)    77 y.o. female presents with the above complaint.   ROS: Denies fever chills nausea vomiting muscle aches pains calf pain back pain chest pain shortness of breath.  Past Medical History:  Diagnosis Date  . Brain tumor ([place])    a. Left intraventricular tumor ->stable by 06/2017 MRI. Followed @ [facility].  . Gait disturbance    a. Unsteady on feet/balance difficulty.  . Generalized osteoarthritis of multiple sites   . GERD (gastroesophageal reflux disease)   . History of DVT (deep vein thrombosis) 2016   estrogen and airflights. Rx with xarelto for 3 months  . History of stress test    a. 11/2016 ETT: No ST/T changes.  Performed to assess PAC/PVC burden with activity ->No ectopy noted.  . Hypertension   . Hyperthyroidism    treated briefly in college  . Insomnia   . Obesity   . PAF (paroxysmal atrial fibrillation) ([place])    a. 12/2016 Event Monitor: brief run of PAF-->CHA2DS2VASc = 5--> Xarelto.  Marland Kitchen Retinal tear of left eye   . Rosacea   . Type 2 diabetes, controlled, with peripheral neuropathy ([place])    a. 11/2016 A1c 7.4.  . Uterine fibroid    Past Surgical History:  Procedure Laterality Date  . CARPAL TUNNEL RELEASE Right    Dr Burney Gauze  . CATARACT EXTRACTION    . RETINAL TEAR REPAIR CRYOTHERAPY     10/09/13, then again 3/15    Current Outpatient Medications:  .  Ascorbic Acid (VITAMIN C) 100 MG CHEW, Chew 200 mg by mouth daily as needed (immune system support)., Disp:  , Rfl:  .  celecoxib (CELEBREX) 200 MG capsule, TAKE 1 CAPSULE BY MOUTH ONCE DAILY AS NEEDED FOR MILD TO MODERATE PAIN, Disp: 30 capsule, Rfl: 5 .  fluticasone (VERAMYST) 27.5 MCG/SPRAY nasal spray, Place 2 sprays into the nose daily as needed for allergies. , Disp: , Rfl:  .  furosemide (LASIX) 20 MG tablet, Take 1 tablet (20 mg total) by mouth daily as needed., Disp: 30 tablet, Rfl: 3 .  glimepiride (AMARYL) 1 MG tablet, TAKE 1 TABLET EVERY DAY WITH BREAKFAST, Disp: 90 tablet, Rfl: 3 .  losartan-hydrochlorothiazide (HYZAAR) 100-12.5 MG tablet, Take 1 tablet by mouth daily., Disp: 90 tablet, Rfl: 3 .  metoprolol succinate (TOPROL-XL) 25 MG 24 hr tablet, Take 1 tablet (25 mg total) by mouth daily., Disp: 90 tablet, Rfl: 3 .  Multiple Vitamin (MULTIVITAMIN) tablet, Take 1 tablet by mouth daily., Disp: , Rfl:  .  pioglitazone (ACTOS) 45 MG tablet, Take 1 tablet (45 mg total) by mouth daily., Disp: 90 tablet, Rfl: 3 .  potassium chloride SA (K-DUR,KLOR-CON) 20 MEQ tablet, Take 1 tablet (20 mEq total) by mouth daily as needed., Disp: 30 tablet, Rfl: 3 .  rivaroxaban (XARELTO) 20 MG TABS tablet, Take 1 tablet (20 mg total) by mouth daily with supper., Disp: 90 tablet, Rfl: 3  Allergies  Allergen  Reactions  . Penicillins     Has patient had a PCN reaction causing immediate rash, facial/tongue/throat swelling, SOB or lightheadedness with hypotension: Yes Has patient had a PCN reaction causing severe rash involving mucus membranes or skin necrosis: Yes Has patient had a PCN reaction that required hospitalization No Has patient had a PCN reaction occurring within the last 10 years: No If all of the above answers are "NO", then may proceed with Cephalosporin use.    . Sulfa Antibiotics     Rash/itching  . Azithromycin Palpitations   Review of Systems Objective:   Vitals:   06/25/18 1558  BP: 140/70  Pulse: 91  Resp: 16    General: Well developed, nourished, in no acute distress, alert and  oriented x3   Dermatological: Skin is warm, dry and supple bilateral. Nails x 10 are well maintained; remaining integument appears unremarkable at this time. There are no open sores, no preulcerative lesions, no rash or signs of infection present.  Vascular: Dorsalis Pedis artery and Posterior Tibial artery pedal pulses are 2/4 bilateral with immedate capillary fill time. Pedal hair growth present. No varicosities and no lower extremity edema present bilateral.   Neruologic: Grossly intact via light touch bilateral. Vibratory intact via tuning fork bilateral. Protective threshold with Semmes Wienstein monofilament intact to all pedal sites bilateral. Patellar and Achilles deep tendon reflexes 2+ bilateral. No Babinski or clonus noted bilateral.   Musculoskeletal: No gross boney pedal deformities bilateral. No pain, crepitus, or limitation noted with foot and ankle range of motion bilateral. Muscular strength 5/5 in all groups tested bilateral.  Pain on palpation medial calcaneal tubercles bilateral.  Swelling of the ankles nonpitting.  Gait: Unassisted, Nonantalgic.    Radiographs:  Radiographs taken today demonstrate mild osteoarthritic changes of the midfoot soft tissue increase in density plantar calcaneal insertion site left greater right.  No acute findings.  Assessment & Plan:   Assessment: Plantar fasciitis bilateral.  Plan: Discussed etiology pathology conservative versus surgical therapies injected bilateral heels 20 mg Kenalog 5 mg Marcaine plantar fascial braces no anti-inflammatories we did discuss appropriate shoe gear and stretching exercises.     Kinslie Hove T. [facility], [place]

## 2018-07-17 ENCOUNTER — Ambulatory Visit (INDEPENDENT_AMBULATORY_CARE_PROVIDER_SITE_OTHER): Payer: Medicare Other | Admitting: Internal Medicine

## 2018-07-17 ENCOUNTER — Encounter: Payer: Self-pay | Admitting: Internal Medicine

## 2018-07-17 VITALS — BP 118/76 | HR 75 | Temp 98.1°F | Ht 66.0 in | Wt 208.0 lb

## 2018-07-17 DIAGNOSIS — M79672 Pain in left foot: Secondary | ICD-10-CM | POA: Diagnosis not present

## 2018-07-17 DIAGNOSIS — Z23 Encounter for immunization: Secondary | ICD-10-CM

## 2018-07-17 NOTE — Progress Notes (Signed)
Subjective:    Patient ID: Angela Bentley, female    DOB: Feb 14, 1941, 77 y.o.   MRN: [id number]  HPI Here due to left foot pain  Saw podiatrist for bliateral plantar fasciitis Got shots in both feet Some ongoing symptoms Has braces for her feet as well--the left foot had to be worn "backwards"  15 days later---noticed pain near the left ankle Different than the other foot pain Mostly over mid metatarsals on dorsum  Stopped using the brace on both feet--while on trip to beach Pain persists  Unable to get into the podiatrist this week (out of office)    Current Outpatient Medications on File Prior to Visit  Medication Sig Dispense Refill  . Ascorbic Acid (VITAMIN C) 100 MG CHEW Chew 200 mg by mouth daily as needed (immune system support).    . celecoxib (CELEBREX) 200 MG capsule TAKE 1 CAPSULE BY MOUTH ONCE DAILY AS NEEDED FOR MILD TO MODERATE PAIN 30 capsule 5  . fluticasone (VERAMYST) 27.5 MCG/SPRAY nasal spray Place 2 sprays into the nose daily as needed for allergies.     . furosemide (LASIX) 20 MG tablet Take 1 tablet (20 mg total) by mouth daily as needed. 30 tablet 3  . glimepiride (AMARYL) 1 MG tablet TAKE 1 TABLET EVERY DAY WITH BREAKFAST 90 tablet 3  . losartan-hydrochlorothiazide (HYZAAR) 100-12.5 MG tablet Take 1 tablet by mouth daily. 90 tablet 3  . metoprolol succinate (TOPROL-XL) 25 MG 24 hr tablet Take 1 tablet (25 mg total) by mouth daily. 90 tablet 3  . Multiple Vitamin (MULTIVITAMIN) tablet Take 1 tablet by mouth daily.    . pioglitazone (ACTOS) 45 MG tablet Take 1 tablet (45 mg total) by mouth daily. 90 tablet 3  . potassium chloride SA (K-DUR,KLOR-CON) 20 MEQ tablet Take 1 tablet (20 mEq total) by mouth daily as needed. 30 tablet 3  . rivaroxaban (XARELTO) 20 MG TABS tablet Take 1 tablet (20 mg total) by mouth daily with supper. 90 tablet 3   No current facility-administered medications on file prior to visit.     Allergies  Allergen Reactions  .  Penicillins     Has patient had a PCN reaction causing immediate rash, facial/tongue/throat swelling, SOB or lightheadedness with hypotension: Yes Has patient had a PCN reaction causing severe rash involving mucus membranes or skin necrosis: Yes Has patient had a PCN reaction that required hospitalization No Has patient had a PCN reaction occurring within the last 10 years: No If all of the above answers are "NO", then may proceed with Cephalosporin use.    . Sulfa Antibiotics     Rash/itching  . Azithromycin Palpitations    Past Medical History:  Diagnosis Date  . Brain tumor ([place])    a. Left intraventricular tumor ->stable by 06/2017 MRI. Followed @ [facility].  . Gait disturbance    a. Unsteady on feet/balance difficulty.  . Generalized osteoarthritis of multiple sites   . GERD (gastroesophageal reflux disease)   . History of DVT (deep vein thrombosis) 2016   estrogen and airflights. Rx with xarelto for 3 months  . History of stress test    a. 11/2016 ETT: No ST/T changes.  Performed to assess PAC/PVC burden with activity ->No ectopy noted.  . Hypertension   . Hyperthyroidism    treated briefly in college  . Insomnia   . Obesity   . PAF (paroxysmal atrial fibrillation) ([place])    a. 12/2016 Event Monitor: brief run of PAF-->CHA2DS2VASc = 5--> Xarelto.  Marland Kitchen  Retinal tear of left eye   . Rosacea   . Type 2 diabetes, controlled, with peripheral neuropathy ([place])    a. 11/2016 A1c 7.4.  . Uterine fibroid     Past Surgical History:  Procedure Laterality Date  . CARPAL TUNNEL RELEASE Right    Dr Burney Gauze  . CATARACT EXTRACTION    . RETINAL TEAR REPAIR CRYOTHERAPY     10/09/13, then again 3/15    Family History  Problem Relation Age of Onset  . Diabetes Father   . Pancreatic cancer Father   . Diabetes Other        grandmother    Social History   Socioeconomic History  . Marital status: Married    Spouse name: Not on file  . Number of children: 3  . Years of education: Not on  file  . Highest education level: Not on file  Occupational History  . Occupation: Non Patent attorney    Comment: [employer] and others  Social Needs  . Financial resource strain: Not on file  . Food insecurity:    Worry: Not on file    Inability: Not on file  . Transportation needs:    Medical: Not on file    Non-medical: Not on file  Tobacco Use  . Smoking status: Former Smoker    Last attempt to quit: 09/26/1993    Years since quitting: 24.8  . Smokeless tobacco: Never Used  . Tobacco comment: quit in 1996  Substance and Sexual Activity  . Alcohol use: Yes    Alcohol/week: 1.0 standard drinks    Types: 1 Standard drinks or equivalent per week    Comment: occasional wine  . Drug use: No  . Sexual activity: Never    Partners: Male    Comment: husband vasectomy  Lifestyle  . Physical activity:    Days per week: Not on file    Minutes per session: Not on file  . Stress: Not on file  Relationships  . Social connections:    Talks on phone: Not on file    Gets together: Not on file    Attends religious service: Not on file    Active member of club or organization: Not on file    Attends meetings of clubs or organizations: Not on file    Relationship status: Not on file  . Intimate partner violence:    Fear of current or ex partner: Not on file    Emotionally abused: Not on file    Physically abused: Not on file    Forced sexual activity: Not on file  Other Topics Concern  . Not on file  Social History Narrative   Has living will   Husband is health care POA-- or daughter Izora Gala   Would accept resuscitation attempts   Would not want tube feeds if cognitively unaware    Review of Systems No fever Appetite okay Depressed about not being able to get around much    Objective:   Physical Exam  Musculoskeletal:  Puffiness around the left ankle and dorsum of foot No tenderness at ankle Mild tenderness over proximal metatarsals            Assessment & Plan:

## 2018-07-17 NOTE — Assessment & Plan Note (Signed)
Seems to be mechanical---likely pushing on that part of foot by brace worn backwards (without the cut out area in the proper position) Nothing worrisome on exam celebrex did help She will continue to hold off on the braces

## 2018-07-25 ENCOUNTER — Ambulatory Visit (INDEPENDENT_AMBULATORY_CARE_PROVIDER_SITE_OTHER): Payer: Medicare Other | Admitting: Podiatry

## 2018-07-25 ENCOUNTER — Encounter: Payer: Self-pay | Admitting: Podiatry

## 2018-07-25 ENCOUNTER — Ambulatory Visit (INDEPENDENT_AMBULATORY_CARE_PROVIDER_SITE_OTHER): Payer: Medicare Other

## 2018-07-25 DIAGNOSIS — M7750 Other enthesopathy of unspecified foot: Secondary | ICD-10-CM

## 2018-07-25 DIAGNOSIS — M7752 Other enthesopathy of left foot: Secondary | ICD-10-CM

## 2018-07-25 MED ORDER — DICLOFENAC SODIUM 1 % TD GEL: 4.0000 g | Freq: Four times a day (QID) | TRANSDERMAL | 2 refills | Status: DC

## 2018-07-25 NOTE — Progress Notes (Signed)
She presents today for follow-up of the bilateral heels.  She states that the left was really been hurting I think it may have broken something in my foot because it hurts and felt like she heard a crack or pop.  She states that now is really swollen around the ankle.  Objective: Vital signs are stable she is alert and oriented x3.  Pulses are palpable.  At this point she still has pain on palpation medial calcaneal tubercles bilateral heel no reproducible pain on palpation of the forefoot left.  Radiographs taken today do not demonstrate any type of osseous abnormalities in the forefoot no acute findings.  Assessment: Chronic letter fasciitis bilateral.  Plan: She declines further injections but would like a topical anti-inflammatory so we wrote a prescription for Voltaren gel.  Also wrote a prescription for physical therapy.  Follow-up with her on an as-needed basis.

## 2018-07-29 DIAGNOSIS — M7750 Other enthesopathy of unspecified foot: Secondary | ICD-10-CM

## 2018-08-14 ENCOUNTER — Ambulatory Visit: Payer: Medicare Other | Attending: Internal Medicine | Admitting: Physical Therapy

## 2018-08-14 ENCOUNTER — Other Ambulatory Visit: Payer: Self-pay

## 2018-08-14 ENCOUNTER — Encounter: Payer: Self-pay | Admitting: Physical Therapy

## 2018-08-14 DIAGNOSIS — R2681 Unsteadiness on feet: Secondary | ICD-10-CM | POA: Insufficient documentation

## 2018-08-14 DIAGNOSIS — R262 Difficulty in walking, not elsewhere classified: Secondary | ICD-10-CM | POA: Diagnosis not present

## 2018-08-14 DIAGNOSIS — M79672 Pain in left foot: Secondary | ICD-10-CM | POA: Diagnosis not present

## 2018-08-14 DIAGNOSIS — M79671 Pain in right foot: Secondary | ICD-10-CM

## 2018-08-14 DIAGNOSIS — M6281 Muscle weakness (generalized): Secondary | ICD-10-CM | POA: Diagnosis not present

## 2018-08-14 NOTE — Therapy (Signed)
[place] MAIN [facility] SERVICES [address] [place], [place], [address] Phone: [phone]   Fax:  [phone]  Physical Therapy Evaluation  Patient Details  Name: Angela Bentley MRN: [id number] Date of Birth: 1941-07-19 Referring Provider (PT): Dr. [name]   Encounter Date: 08/14/2018  PT End of Session - 08/14/18 1256    Visit Number  1    Number of Visits  17    Date for PT Re-Evaluation  10/09/18    Authorization Type  1/10, Start of care 08/14/18    PT Start Time  1018    PT Stop Time  1120    PT Time Calculation (min)  62 min    Activity Tolerance  Patient tolerated treatment well;No increased pain    Behavior During Therapy  WFL for tasks assessed/performed       Past Medical History:  Diagnosis Date  . Brain tumor ([place])    a. Left intraventricular tumor ->stable by 06/2017 MRI. Followed @ [facility].  . Gait disturbance    a. Unsteady on feet/balance difficulty.  . Generalized osteoarthritis of multiple sites   . GERD (gastroesophageal reflux disease)   . History of DVT (deep vein thrombosis) 2016   estrogen and airflights. Rx with xarelto for 3 months  . History of stress test    a. 11/2016 ETT: No ST/T changes.  Performed to assess PAC/PVC burden with activity ->No ectopy noted.  . Hypertension   . Hyperthyroidism    treated briefly in college  . Insomnia   . Obesity   . PAF (paroxysmal atrial fibrillation) ([place])    a. 12/2016 Event Monitor: brief run of PAF-->CHA2DS2VASc = 5--> Xarelto.  Marland Kitchen Retinal tear of left eye   . Rosacea   . Type 2 diabetes, controlled, with peripheral neuropathy ([place])    a. 11/2016 A1c 7.4.  . Uterine fibroid     Past Surgical History:  Procedure Laterality Date  . CARPAL TUNNEL RELEASE Right    Dr Burney Gauze  . CATARACT EXTRACTION    . RETINAL TEAR REPAIR CRYOTHERAPY     10/09/13, then again 3/15    There were no vitals filed for this visit.   Subjective Assessment - 08/14/18 1026    Subjective  "My foot has been hurting and now I am walking funny which has made my hip and back hurt from time to time."     Pertinent History  77 yo Female reports history of bilateral foot plantar fasciitis since August 2019 which has been bothering her since; She went to see Dr. Milinda Pointer who did injection in bilateral feet end of September 2019. She was then given soft ankle brace to wear. she reports not really getting relief in right foot. A week after wearing the brace she started having severe pain in left foot along tarsal joint (anterior foot), she stopped wearing brace. she went to see Dr. [name] who recommended she follow up with Dr. Milinda Pointer who did do x-ray which was negative for fracture. She reports still having bilateral foot pain and intermittent swelling; She is walking without assistive device but her walk is antalgic with decreased heel strike;     How long can you sit comfortably?  NA    How long can you stand comfortably?  has pain in feet with initial standing, and will continue to hurt with longer period of standing;     How long can you walk comfortably?  hurts with most walking; has limited walking; not walking  the dog    Diagnostic tests  X-ray negative for fracture;     Patient Stated Goals  improve walking and reduce foot pain;     Currently in Pain?  Yes    Pain Score  7     Pain Location  Foot    Pain Orientation  Right    Pain Descriptors / Indicators  Sharp;Burning    Pain Type  Chronic pain    Pain Radiating Towards  heel    Pain Onset  More than a month ago    Pain Frequency  Intermittent    Aggravating Factors   initial standing, prolonged standing/walking    Pain Relieving Factors  rest/ice/pain meds    Effect of Pain on Daily Activities  decreased walking tolerance    Multiple Pain Sites  Yes    Pain Score  5    Pain Location  Foot    Pain Orientation  Left    Pain Descriptors / Indicators  Aching    Pain Type  Chronic pain    Pain Radiating Towards  along top  of foot and side of foot    Pain Onset  More than a month ago    Pain Frequency  Intermittent    Aggravating Factors   tender to touch    Pain Relieving Factors  rest    Effect of Pain on Daily Activities  decreased walking tolerance;          [facility] PT Assessment - 08/14/18 0001      Assessment   Medical Diagnosis  foot pain    Referring Provider (PT)  Dr. [name]    Onset Date/Surgical Date  04/26/18    Hand Dominance  Right    Next MD Visit  none scheduled      Precautions   Precautions  Fall    Required Braces or Orthoses  --   has foot brace, but not wearing it     Restrictions   Weight Bearing Restrictions  No      Balance Screen   Has the patient fallen in the past 6 months  No    Has the patient had a decrease in activity level because of a fear of falling?   Yes    Is the patient reluctant to leave their home because of a fear of falling?   No      Home Environment   Additional Comments  lives in single story independent living community with husband, mod I for self care ADLs      Prior Function   Level of Independence  Independent    Vocation  Retired      Associate Professor   Overall Cognitive Status  Within Functional Limits for tasks assessed      Observation/Other Assessments   Observations  very pleasant woman      Sensation   Light Touch  Appears Intact    Proprioception  Appears Intact      Coordination   Gross Motor Movements are Fluid and Coordinated  Yes    Fine Motor Movements are Fluid and Coordinated  Yes      Posture/Postural Control   Posture Comments  exhibits mild slumped posture, able to self correct with cues; when standing, is in a more flexed position due to increased foot/back pain;      ROM / Strength   AROM / PROM / Strength  AROM;Strength      AROM   Overall AROM Comments  BUE  and BLE AROM is [facility] with exception of foot    Right Ankle Dorsiflexion  -8    Right Ankle Plantar Flexion  80    Right Ankle Inversion  28    Right Ankle  Eversion  24    Left Ankle Dorsiflexion  5    Left Ankle Plantar Flexion  80    Left Ankle Inversion  30    Left Ankle Eversion  10      Strength   Overall Strength Comments  BUE and BLE are WFL; BLE ankle strength is 4+/5      Palpation   Palpation comment  moderate tenderness to right heel, moderate tenderness to left plantar fascia along middle of foot, both consistent with possibel plantar fasciitis. Moderate tendereness along lateral met heads on left foot with pain with squeezing of met heads consistent with possible metatarsalgia.       Transfers   Comments  able to transfer sit<>Stand mod I      Ambulation/Gait   Gait Comments  ambulates with wider base of support, decreased ankle DF at heel strike, short step length with flexed position with increased lateral hip sway consistent with antalgic gait; slightly slower gait speed;       High Level Balance   High Level Balance Comments  static standing balance: good, dynamic standing balance: good                Objective measurements completed on examination: See above findings.    TREATMENT: Initiated HEP: Standing with single leg heel off step 20 sec hold x1 rep bilaterally; instructed patient to do single leg at a time for better ankle ROM; Standing calf stretch against wall 20 sec hold x1 rep; Standing soleus stretch against wall 20 sec hold x1 rep; Patient educated in plantar fasciitis and importance of HEP adherence throughout day for better tissue change and to reduce pain; also educated on proper footwear;           PT Education - 08/14/18 1256    Education Details  HEP initiated, recommendations, POC    Person(s) Educated  Patient    Methods  Explanation;Handout    Comprehension  Verbalized understanding;Returned demonstration;Verbal cues required;Need further instruction       PT Short Term Goals - 08/14/18 1417      PT SHORT TERM GOAL #1   Title  Patient will be adherent to HEP at least 3x a  week to improve functional strength and balance for better safety at home.    Time  4    Period  Weeks    Status  New    Target Date  09/11/18      PT SHORT TERM GOAL #2   Title  Patient will report worse pain in right foot of 4/10 over last few days to exhibit improved tolerance with ADLs;     Time  4    Period  Weeks    Status  New    Target Date  09/11/18      PT SHORT TERM GOAL #3   Title  Patient will report worst pain in left foot of 4/10 in last few days to exhibit improved tolerance with ADLs;     Time  4    Period  Weeks    Status  New    Target Date  09/11/18        PT Long Term Goals - 08/14/18 1418      PT LONG TERM  GOAL #1   Title  Patient will increase 10 meter walk test to >1.40ms without antalgic gait, as to improve gait speed for better community ambulation and to reduce fall risk.    Time  8    Period  Weeks    Status  New    Target Date  10/09/18      PT LONG TERM GOAL #2   Title  Patient will be able to sit for 30 min and report <2/10 foot pain upon standing to exhibit improved tolerance with sitting for meals and other ADL tasks.     Time  8    Period  Weeks    Status  New    Target Date  10/09/18      PT LONG TERM GOAL #3   Title  Patient will improve ankle AROM DF >5 degrees bilaterally to improve functional ROM for ADLs such as negotiating stairs.     Time  8    Period  Weeks    Status  New    Target Date  10/09/18      PT LONG TERM GOAL #4   Title  Patient will complete >1000 feet on 6 min walk test without increase in foot pain to improve community ambulator distance for functional tasks and return to PLOF.     Time  8    Period  Weeks    Status  New    Target Date  10/09/18             Plan - 08/14/18 1411    Clinical Impression Statement  77yo Female reports increased foot pain bilaterally since August 2019 which has resulted in impaired walking and mobility. Patient was diagnosed with plantar fasciitis. She is s/p cortisone  injections which helped the left foot some, but patient reports right foot is getting worse. She was given a small brace for each foot which actually increased pain in left foot. Pain in left foot is consistent with possible metarsalgia. She does exhibit decreased ROM in bilateral ankles with tenderness along plantar fascia. She ambulates without AD, mod I with short step length, wide base of support, flat foot at heel strike with flexed posture due to foot pain. She would benefit from skilled PT Intervention to reduce foot pain and improve gait and balance/mobility.    History and Personal Factors relevant to plan of care:  lives with husband in single story apartment; has small dog; co-morbidities (diabetes) with history of imbalance; no recent falls;     Clinical Presentation  Evolving    Clinical Presentation due to:  pain is in feet, but due to pain is walking differently which is causing pain in right hip and low back;     Clinical Decision Making  Moderate    Rehab Potential  Good    Clinical Impairments Affecting Rehab Potential  motivated, has good caregiver support    PT Frequency  2x / week    PT Duration  8 weeks    PT Treatment/Interventions  Cryotherapy;Iontophoresis 47mml Dexamethasone;Moist Heat;Ultrasound;Gait training;Stair training;Functional mobility training;Therapeutic activities;Therapeutic exercise;Balance training;Neuromuscular re-education;Patient/family education;Orthotic Fit/Training;Manual techniques;Passive range of motion;Dry needling;Energy conservation;Splinting;Taping    PT Next Visit Plan  advance HEP, manual, ionto/taping    PT Home Exercise Plan  initiated, see patient instructions    Consulted and Agree with Plan of Care  Patient       Patient will benefit from skilled therapeutic intervention in order to improve the following deficits and impairments:  Abnormal gait, Decreased activity tolerance, Pain, Difficulty walking, Decreased mobility, Decreased balance,  Decreased range of motion, Impaired flexibility  Visit Diagnosis: Pain in right foot  Pain in left foot  Difficulty in walking, not elsewhere classified     Problem List Patient Active Problem List   Diagnosis Date Noted  . Left foot pain 07/17/2018  . Tinnitus, bilateral 11/28/2017  . Abnormal MRI of head 11/14/2017  . Elevated erythrocyte sedimentation rate 11/14/2017  . Right temporal headache 11/14/2017  . Brain mass 09/21/2017  . Ataxia 05/11/2017  . Dizziness 04/06/2017  . PAF (paroxysmal atrial fibrillation) ([place]) 02/03/2017  . DOE (dyspnea on exertion) 12/01/2016  . Acute right-sided low back pain without sciatica 08/26/2016  . Preventative health care 06/02/2016  . Advance directive discussed with patient 06/02/2016  . Achilles tendonitis 03/11/2016  . Hypertension   . DVT, lower extremity, recurrent ([place])   . Type 2 diabetes, controlled, with peripheral neuropathy ([place])   . Generalized osteoarthritis of multiple sites   . GERD (gastroesophageal reflux disease)   . Hyperthyroidism   . Retinal hole of right eye 06/26/2014  . Amblyopia, right 05/08/2014  . Status post intraocular lens implant 05/08/2014    Ab Leaming PT, DPT 08/14/2018, 2:21 PM  [place] MAIN [facility] SERVICES [address] [place], [place], [address] Phone: [phone]   Fax:  [phone]  Name: Luci Bellucci MRN: [id number] Date of Birth: 1941/01/01

## 2018-08-21 ENCOUNTER — Encounter: Payer: Self-pay | Admitting: Physical Therapy

## 2018-08-21 ENCOUNTER — Ambulatory Visit: Payer: Medicare Other | Admitting: Physical Therapy

## 2018-08-21 DIAGNOSIS — M79672 Pain in left foot: Secondary | ICD-10-CM | POA: Diagnosis not present

## 2018-08-21 DIAGNOSIS — R262 Difficulty in walking, not elsewhere classified: Secondary | ICD-10-CM

## 2018-08-21 DIAGNOSIS — M79671 Pain in right foot: Secondary | ICD-10-CM

## 2018-08-21 DIAGNOSIS — M6281 Muscle weakness (generalized): Secondary | ICD-10-CM | POA: Diagnosis not present

## 2018-08-21 DIAGNOSIS — R2681 Unsteadiness on feet: Secondary | ICD-10-CM | POA: Diagnosis not present

## 2018-08-21 NOTE — Therapy (Signed)
[place] MAIN [facility] SERVICES [address] [place], [place], [address] Phone: [phone]   Fax:  [phone]  Physical Therapy Treatment  Patient Details  Name: Angela Bentley MRN: [id number] Date of Birth: 10-17-1940 Referring Provider (PT): Dr. [name]   Encounter Date: 08/21/2018  PT End of Session - 08/21/18 1356    Visit Number  2    Number of Visits  17    Date for PT Re-Evaluation  10/09/18    Authorization Type  2/10, Start of care 08/14/18    PT Start Time  3220    PT Stop Time  1135    PT Time Calculation (min)  46 min    Activity Tolerance  Patient tolerated treatment well;No increased pain    Behavior During Therapy  WFL for tasks assessed/performed       Past Medical History:  Diagnosis Date  . Brain tumor ([place])    a. Left intraventricular tumor ->stable by 06/2017 MRI. Followed @ [facility].  . Gait disturbance    a. Unsteady on feet/balance difficulty.  . Generalized osteoarthritis of multiple sites   . GERD (gastroesophageal reflux disease)   . History of DVT (deep vein thrombosis) 2016   estrogen and airflights. Rx with xarelto for 3 months  . History of stress test    a. 11/2016 ETT: No ST/T changes.  Performed to assess PAC/PVC burden with activity ->No ectopy noted.  . Hypertension   . Hyperthyroidism    treated briefly in college  . Insomnia   . Obesity   . PAF (paroxysmal atrial fibrillation) ([place])    a. 12/2016 Event Monitor: brief run of PAF-->CHA2DS2VASc = 5--> Xarelto.  Marland Kitchen Retinal tear of left eye   . Rosacea   . Type 2 diabetes, controlled, with peripheral neuropathy ([place])    a. 11/2016 A1c 7.4.  . Uterine fibroid     Past Surgical History:  Procedure Laterality Date  . CARPAL TUNNEL RELEASE Right    Dr Burney Gauze  . CATARACT EXTRACTION    . RETINAL TEAR REPAIR CRYOTHERAPY     10/09/13, then again 3/15    There were no vitals filed for this visit.  Subjective Assessment - 08/21/18 1056    Subjective  Patient reports feeling less stretch when doing standing calf stretch and having increased knee discomfort on right side; she reports still feeing stretch on left side;  Patient reports adherence to HEP; Reports having increased pain today but states having some days of less pain;     Pertinent History  77 yo Female reports history of bilateral foot plantar fasciitis since August 2019 which has been bothering her since; She went to see Dr. Milinda Pointer who did injection in bilateral feet end of September 2019. She was then given soft ankle brace to wear. she reports not really getting relief in right foot. A week after wearing the brace she started having severe pain in left foot along tarsal joint (anterior foot), she stopped wearing brace. she went to see Dr. [name] who recommended she follow up with Dr. Milinda Pointer who did do x-ray which was negative for fracture. She reports still having bilateral foot pain and intermittent swelling; She is walking without assistive device but her walk is antalgic with decreased heel strike;     How long can you sit comfortably?  NA    How long can you stand comfortably?  has pain in feet with initial standing, and will continue to hurt with longer period of  standing;     How long can you walk comfortably?  hurts with most walking; has limited walking; not walking the dog    Diagnostic tests  X-ray negative for fracture;     Patient Stated Goals  improve walking and reduce foot pain;     Currently in Pain?  Yes    Pain Score  6     Pain Location  Foot    Pain Orientation  Right    Pain Descriptors / Indicators  Burning;Sharp    Pain Type  Chronic pain    Pain Onset  More than a month ago    Pain Frequency  Intermittent    Aggravating Factors   initial standing/prolonged standing/walking    Pain Relieving Factors  rest/ice/pain meds    Effect of Pain on Daily Activities  decreased walking tolerance;     Multiple Pain Sites  Yes    Pain Score  3    Pain Location   Foot    Pain Orientation  Left    Pain Descriptors / Indicators  Aching    Pain Type  Chronic pain    Pain Onset  More than a month ago    Pain Frequency  Intermittent    Aggravating Factors   tender to touch    Pain Relieving Factors  rest    Effect of Pain on Daily Activities  decreased walking tolerance;          [facility] PT Assessment - 08/21/18 0001      Standardized Balance Assessment   10 Meter Walk  0.86 m/s without AD, limited community ambulator, increased risk for falls        TREATMENT:  Ankle ROM at start of session: ankle DF: R: 10 degrees, L: 0 degrees  PT performed extensive soft tissue massage including IASTM utilizing edge tool to reduce tightness in right heel and left plantar fascia; utilized manual techniques including cross friction massage. Patient initially reported moderate tenderness to palpation of right heel and left plantar fascia; however following manual therapy, patient reports significant reduction in pain. Patient initially exhibited significant tightness, however following manual therapy, improved tissue extensibility noted; manual therapy time: R foot: 18 min, left foot: 16 min;  Following manual therapy instructed patient in long sitting ankle DF stretch with toe extension using towel for better stretch to toe flexors on left foot; Patient required min VCs for correct positioning;  PT also performed grade II-III AP mobs to left talocrual joint to facilitate better ankle ROM 10 sec bouts x4 sets; Patient denies any pain during mobilization;   Repeated ankle ROM at end of session: ankle DF: L: 8 degrees                   PT Education - 08/21/18 1355    Education Details  HEP reinforced, manual therapy,     Person(s) Educated  Patient    Methods  Explanation;Demonstration;Verbal cues    Comprehension  Verbalized understanding;Returned demonstration;Verbal cues required;Need further instruction       PT Short Term Goals -  08/14/18 1417      PT SHORT TERM GOAL #1   Title  Patient will be adherent to HEP at least 3x a week to improve functional strength and balance for better safety at home.    Time  4    Period  Weeks    Status  New    Target Date  09/11/18      PT SHORT TERM  GOAL #2   Title  Patient will report worse pain in right foot of 4/10 over last few days to exhibit improved tolerance with ADLs;     Time  4    Period  Weeks    Status  New    Target Date  09/11/18      PT SHORT TERM GOAL #3   Title  Patient will report worst pain in left foot of 4/10 in last few days to exhibit improved tolerance with ADLs;     Time  4    Period  Weeks    Status  New    Target Date  09/11/18        PT Long Term Goals - 08/14/18 1418      PT LONG TERM GOAL #1   Title  Patient will increase 10 meter walk test to >1.61m/s without antalgic gait, as to improve gait speed for better community ambulation and to reduce fall risk.    Time  8    Period  Weeks    Status  New    Target Date  10/09/18      PT LONG TERM GOAL #2   Title  Patient will be able to sit for 30 min and report <2/10 foot pain upon standing to exhibit improved tolerance with sitting for meals and other ADL tasks.     Time  8    Period  Weeks    Status  New    Target Date  10/09/18      PT LONG TERM GOAL #3   Title  Patient will improve ankle AROM DF >5 degrees bilaterally to improve functional ROM for ADLs such as negotiating stairs.     Time  8    Period  Weeks    Status  New    Target Date  10/09/18      PT LONG TERM GOAL #4   Title  Patient will complete >1000 feet on 6 min walk test without increase in foot pain to improve community ambulator distance for functional tasks and return to PLOF.     Time  8    Period  Weeks    Status  New    Target Date  10/09/18            Plan - 08/21/18 1411    Clinical Impression Statement  PT performed extensive manual therapy to improve tissue extensibility; assessed ankle ROM.  Patient exhibits improved ankle ROM on RLE at start of session indicating improved flexibility; she did exhibit improved ankle ROM on LLE during session indicating good response to manual therapy treatment. Patient reports less pain in both feet at end of session, reporting, "I feel so much better. Wow. Its incredible how much better it feels." Patient would benefit from additional skilled PT intervention to improve flexibility and reduce pain in feet;     Rehab Potential  Good    Clinical Impairments Affecting Rehab Potential  motivated, has good caregiver support    PT Frequency  2x / week    PT Duration  8 weeks    PT Treatment/Interventions  Cryotherapy;Iontophoresis 4mg /ml Dexamethasone;Moist Heat;Ultrasound;Gait training;Stair training;Functional mobility training;Therapeutic activities;Therapeutic exercise;Balance training;Neuromuscular re-education;Patient/family education;Orthotic Fit/Training;Manual techniques;Passive range of motion;Dry needling;Energy conservation;Splinting;Taping    PT Next Visit Plan  advance HEP, manual, ionto/taping    PT Home Exercise Plan  initiated, see patient instructions    Consulted and Agree with Plan of Care  Patient       Patient  will benefit from skilled therapeutic intervention in order to improve the following deficits and impairments:  Abnormal gait, Decreased activity tolerance, Pain, Difficulty walking, Decreased mobility, Decreased balance, Decreased range of motion, Impaired flexibility  Visit Diagnosis: Pain in right foot  Pain in left foot  Difficulty in walking, not elsewhere classified  Unsteadiness on feet  Muscle weakness (generalized)     Problem List Patient Active Problem List   Diagnosis Date Noted  . Left foot pain 07/17/2018  . Tinnitus, bilateral 11/28/2017  . Abnormal MRI of head 11/14/2017  . Elevated erythrocyte sedimentation rate 11/14/2017  . Right temporal headache 11/14/2017  . Brain mass 09/21/2017  . Ataxia  05/11/2017  . Dizziness 04/06/2017  . PAF (paroxysmal atrial fibrillation) ([place]) 02/03/2017  . DOE (dyspnea on exertion) 12/01/2016  . Acute right-sided low back pain without sciatica 08/26/2016  . Preventative health care 06/02/2016  . Advance directive discussed with patient 06/02/2016  . Achilles tendonitis 03/11/2016  . Hypertension   . DVT, lower extremity, recurrent ([place])   . Type 2 diabetes, controlled, with peripheral neuropathy ([place])   . Generalized osteoarthritis of multiple sites   . GERD (gastroesophageal reflux disease)   . Hyperthyroidism   . Retinal hole of right eye 06/26/2014  . Amblyopia, right 05/08/2014  . Status post intraocular lens implant 05/08/2014    Stan Cantave PT, DPT 08/21/2018, 2:13 PM  [place] MAIN [facility] SERVICES [address] [place], [place], [address] Phone: [phone]   Fax:  [phone]  Name: Angela Bentley MRN: [id number] Date of Birth: 01/06/1941

## 2018-08-23 ENCOUNTER — Encounter: Payer: Self-pay | Admitting: Physical Therapy

## 2018-08-27 ENCOUNTER — Ambulatory Visit: Payer: Medicare Other | Attending: Internal Medicine

## 2018-08-27 DIAGNOSIS — M79672 Pain in left foot: Secondary | ICD-10-CM | POA: Diagnosis not present

## 2018-08-27 DIAGNOSIS — M79671 Pain in right foot: Secondary | ICD-10-CM | POA: Diagnosis not present

## 2018-08-27 DIAGNOSIS — R262 Difficulty in walking, not elsewhere classified: Secondary | ICD-10-CM | POA: Diagnosis not present

## 2018-08-27 DIAGNOSIS — R2681 Unsteadiness on feet: Secondary | ICD-10-CM | POA: Diagnosis not present

## 2018-08-27 DIAGNOSIS — M6281 Muscle weakness (generalized): Secondary | ICD-10-CM | POA: Insufficient documentation

## 2018-08-27 NOTE — Therapy (Signed)
[place] MAIN [facility] SERVICES [address] [place], [place], [address] Phone: [phone]   Fax:  [phone]  Physical Therapy Treatment  Patient Details  Name: Angela Bentley MRN: [id number] Date of Birth: 04/29/1941 Referring Provider (PT): Dr. [name]   Encounter Date: 08/27/2018  PT End of Session - 08/27/18 1148    Visit Number  3    Number of Visits  17    Date for PT Re-Evaluation  10/09/18    Authorization Type  3/10, Start of care 08/14/18    PT Start Time  0931    PT Stop Time  1016    PT Time Calculation (min)  45 min    Activity Tolerance  Patient tolerated treatment well;No increased pain    Behavior During Therapy  WFL for tasks assessed/performed       Past Medical History:  Diagnosis Date  . Brain tumor ([place])    a. Left intraventricular tumor ->stable by 06/2017 MRI. Followed @ [facility].  . Gait disturbance    a. Unsteady on feet/balance difficulty.  . Generalized osteoarthritis of multiple sites   . GERD (gastroesophageal reflux disease)   . History of DVT (deep vein thrombosis) 2016   estrogen and airflights. Rx with xarelto for 3 months  . History of stress test    a. 11/2016 ETT: No ST/T changes.  Performed to assess PAC/PVC burden with activity ->No ectopy noted.  . Hypertension   . Hyperthyroidism    treated briefly in college  . Insomnia   . Obesity   . PAF (paroxysmal atrial fibrillation) ([place])    a. 12/2016 Event Monitor: brief run of PAF-->CHA2DS2VASc = 5--> Xarelto.  Marland Kitchen Retinal tear of left eye   . Rosacea   . Type 2 diabetes, controlled, with peripheral neuropathy ([place])    a. 11/2016 A1c 7.4.  . Uterine fibroid     Past Surgical History:  Procedure Laterality Date  . CARPAL TUNNEL RELEASE Right    Dr Burney Gauze  . CATARACT EXTRACTION    . RETINAL TEAR REPAIR CRYOTHERAPY     10/09/13, then again 3/15    There were no vitals filed for this visit.  Subjective Assessment - 08/27/18 1028    Subjective   Patient reports compliance with HEP with the exception of icing due to not liking to take her shoes off during the day. Reports her swelling of her feet and ankles is the least it has been in over a year.     Pertinent History  77 yo Female reports history of bilateral foot plantar fasciitis since August 2019 which has been bothering her since; She went to see Dr. Milinda Pointer who did injection in bilateral feet end of September 2019. She was then given soft ankle brace to wear. she reports not really getting relief in right foot. A week after wearing the brace she started having severe pain in left foot along tarsal joint (anterior foot), she stopped wearing brace. she went to see Dr. [name] who recommended she follow up with Dr. Milinda Pointer who did do x-ray which was negative for fracture. She reports still having bilateral foot pain and intermittent swelling; She is walking without assistive device but her walk is antalgic with decreased heel strike;     How long can you sit comfortably?  NA    How long can you stand comfortably?  has pain in feet with initial standing, and will continue to hurt with longer period of standing;  How long can you walk comfortably?  hurts with most walking; has limited walking; not walking the dog    Diagnostic tests  X-ray negative for fracture;     Patient Stated Goals  improve walking and reduce foot pain;     Currently in Pain?  Yes    Pain Score  2     Pain Location  Foot    Pain Orientation  Right    Pain Descriptors / Indicators  Burning    Pain Type  Chronic pain    Pain Onset  More than a month ago    Pain Frequency  Intermittent    Pain Score  1    Pain Location  Foot    Pain Orientation  Left    Pain Descriptors / Indicators  Aching    Pain Type  Chronic pain    Pain Onset  More than a month ago    Pain Frequency  Intermittent       Patient reports decreased swelling bilaterally and less episodes of pain. Reports ankles are the least swollen they have been  in a year    Ankle ROM at start of session: ankle DF: R: 11 degrees, L: 3 degrees   PT performed extensive soft tissue massage including IASTM utilizing edge tool to reduce tightness in right heel and left plantar fascia; utilized manual techniques including cross friction massage. Patient initially reported moderate tenderness to palpation of right heel and left plantar fascia; however following manual therapy, patient reports significant reduction in pain. Patient initially exhibited significant tightness, however following manual therapy, improved tissue extensibility noted; manual therapy time: R foot: 16 min, left foot/heel: 15 min;  Roller to upper posterior calf musculature 4 minutes each LE.   Ice cup R heel 3 minutes    PT also performed grade II-III AP mobs to left talocrual joint to facilitate better ankle ROM 10 sec bouts x4 sets; Patient denies any pain during mobilization;  Distraction x 4 sets     Repeated ankle ROM at end of session: ankle DF: L: 7 degrees  Gait assessed: decreased antalgic waddle noted after manual tx with increased step length and forward momentum.                           PT Education - 08/27/18 1141    Education Details  HEP reinforced, icing reinforced. manual therapy     Person(s) Educated  Patient    Methods  Explanation;Demonstration;Verbal cues    Comprehension  Verbalized understanding;Returned demonstration       PT Short Term Goals - 08/14/18 1417      PT SHORT TERM GOAL #1   Title  Patient will be adherent to HEP at least 3x a week to improve functional strength and balance for better safety at home.    Time  4    Period  Weeks    Status  New    Target Date  09/11/18      PT SHORT TERM GOAL #2   Title  Patient will report worse pain in right foot of 4/10 over last few days to exhibit improved tolerance with ADLs;     Time  4    Period  Weeks    Status  New    Target Date  09/11/18      PT SHORT TERM GOAL  #3   Title  Patient will report worst pain in left foot of 4/10  in last few days to exhibit improved tolerance with ADLs;     Time  4    Period  Weeks    Status  New    Target Date  09/11/18        PT Long Term Goals - 08/14/18 1418      PT LONG TERM GOAL #1   Title  Patient will increase 10 meter walk test to >1.51m/s without antalgic gait, as to improve gait speed for better community ambulation and to reduce fall risk.    Time  8    Period  Weeks    Status  New    Target Date  10/09/18      PT LONG TERM GOAL #2   Title  Patient will be able to sit for 30 min and report <2/10 foot pain upon standing to exhibit improved tolerance with sitting for meals and other ADL tasks.     Time  8    Period  Weeks    Status  New    Target Date  10/09/18      PT LONG TERM GOAL #3   Title  Patient will improve ankle AROM DF >5 degrees bilaterally to improve functional ROM for ADLs such as negotiating stairs.     Time  8    Period  Weeks    Status  New    Target Date  10/09/18      PT LONG TERM GOAL #4   Title  Patient will complete >1000 feet on 6 min walk test without increase in foot pain to improve community ambulator distance for functional tasks and return to PLOF.     Time  8    Period  Weeks    Status  New    Target Date  10/09/18            Plan - 08/27/18 1203    Clinical Impression Statement  Patient responded well to extensive manual treatment resulting in increased muscle tissue length and extensibility for reduced pain and improved ROM. Patient demonstrated improved gait mechanics after treatment with improved forward momentum and decreased antalgic lateral momentum. Patient would benefit from additional skilled PT intervention to improve flexibility and reduce pain in feet.     Rehab Potential  Good    Clinical Impairments Affecting Rehab Potential  motivated, has good caregiver support    PT Frequency  2x / week    PT Duration  8 weeks    PT  Treatment/Interventions  Cryotherapy;Iontophoresis 4mg /ml Dexamethasone;Moist Heat;Ultrasound;Gait training;Stair training;Functional mobility training;Therapeutic activities;Therapeutic exercise;Balance training;Neuromuscular re-education;Patient/family education;Orthotic Fit/Training;Manual techniques;Passive range of motion;Dry needling;Energy conservation;Splinting;Taping    PT Next Visit Plan  advance HEP, manual, ionto/taping    PT Home Exercise Plan  initiated, see patient instructions    Consulted and Agree with Plan of Care  Patient       Patient will benefit from skilled therapeutic intervention in order to improve the following deficits and impairments:  Abnormal gait, Decreased activity tolerance, Pain, Difficulty walking, Decreased mobility, Decreased balance, Decreased range of motion, Impaired flexibility  Visit Diagnosis: Pain in right foot  Pain in left foot  Difficulty in walking, not elsewhere classified  Unsteadiness on feet  Muscle weakness (generalized)     Problem List Patient Active Problem List   Diagnosis Date Noted  . Left foot pain 07/17/2018  . Tinnitus, bilateral 11/28/2017  . Abnormal MRI of head 11/14/2017  . Elevated erythrocyte sedimentation rate 11/14/2017  . Right temporal headache 11/14/2017  .  Brain mass 09/21/2017  . Ataxia 05/11/2017  . Dizziness 04/06/2017  . PAF (paroxysmal atrial fibrillation) ([place]) 02/03/2017  . DOE (dyspnea on exertion) 12/01/2016  . Acute right-sided low back pain without sciatica 08/26/2016  . Preventative health care 06/02/2016  . Advance directive discussed with patient 06/02/2016  . Achilles tendonitis 03/11/2016  . Hypertension   . DVT, lower extremity, recurrent ([place])   . Type 2 diabetes, controlled, with peripheral neuropathy ([place])   . Generalized osteoarthritis of multiple sites   . GERD (gastroesophageal reflux disease)   . Hyperthyroidism   . Retinal hole of right eye 06/26/2014  . Amblyopia, right  05/08/2014  . Status post intraocular lens implant 05/08/2014    Janna Arch, PT, DPT   08/27/2018, 12:04 PM  [place] MAIN [facility] SERVICES [address] [place], [place], [address] Phone: [phone]   Fax:  [phone]  Name: Angela Bentley MRN: [id number] Date of Birth: 07-17-1941

## 2018-08-30 ENCOUNTER — Ambulatory Visit: Payer: Medicare Other | Admitting: Physical Therapy

## 2018-08-30 ENCOUNTER — Encounter: Payer: Self-pay | Admitting: Physical Therapy

## 2018-08-30 DIAGNOSIS — R262 Difficulty in walking, not elsewhere classified: Secondary | ICD-10-CM

## 2018-08-30 DIAGNOSIS — R2681 Unsteadiness on feet: Secondary | ICD-10-CM

## 2018-08-30 DIAGNOSIS — M79671 Pain in right foot: Secondary | ICD-10-CM

## 2018-08-30 DIAGNOSIS — M6281 Muscle weakness (generalized): Secondary | ICD-10-CM

## 2018-08-30 DIAGNOSIS — M79672 Pain in left foot: Secondary | ICD-10-CM | POA: Diagnosis not present

## 2018-08-30 NOTE — Therapy (Signed)
[place] MAIN [facility] SERVICES [address] [place], [place], [address] Phone: [phone]   Fax:  [phone]  Physical Therapy Treatment  Patient Details  Name: Angela Bentley MRN: [id number] Date of Birth: 31-Mar-1941 Referring Provider (PT): Dr. [name]   Encounter Date: 08/30/2018  PT End of Session - 08/30/18 0840    Visit Number  4    Number of Visits  17    Date for PT Re-Evaluation  10/09/18    Authorization Type  4/10, Start of care 08/14/18    PT Start Time  0842    PT Stop Time  0930    PT Time Calculation (min)  48 min    Activity Tolerance  Patient tolerated treatment well;No increased pain    Behavior During Therapy  WFL for tasks assessed/performed       Past Medical History:  Diagnosis Date  . Brain tumor ([place])    a. Left intraventricular tumor ->stable by 06/2017 MRI. Followed @ [facility].  . Gait disturbance    a. Unsteady on feet/balance difficulty.  . Generalized osteoarthritis of multiple sites   . GERD (gastroesophageal reflux disease)   . History of DVT (deep vein thrombosis) 2016   estrogen and airflights. Rx with xarelto for 3 months  . History of stress test    a. 11/2016 ETT: No ST/T changes.  Performed to assess PAC/PVC burden with activity ->No ectopy noted.  . Hypertension   . Hyperthyroidism    treated briefly in college  . Insomnia   . Obesity   . PAF (paroxysmal atrial fibrillation) ([place])    a. 12/2016 Event Monitor: brief run of PAF-->CHA2DS2VASc = 5--> Xarelto.  Marland Kitchen Retinal tear of left eye   . Rosacea   . Type 2 diabetes, controlled, with peripheral neuropathy ([place])    a. 11/2016 A1c 7.4.  . Uterine fibroid     Past Surgical History:  Procedure Laterality Date  . CARPAL TUNNEL RELEASE Right    Dr Burney Gauze  . CATARACT EXTRACTION    . RETINAL TEAR REPAIR CRYOTHERAPY     10/09/13, then again 3/15    There were no vitals filed for this visit.  Subjective Assessment - 08/30/18 0847    Subjective   Patient reports continued pain with initial standing but reports that with prolonged walking her feet are feeling better overall; She reports that her left ankle continues to feel stiff, but the swelling is better;     Pertinent History  77 yo Female reports history of bilateral foot plantar fasciitis since August 2019 which has been bothering her since; She went to see Dr. Milinda Pointer who did injection in bilateral feet end of September 2019. She was then given soft ankle brace to wear. she reports not really getting relief in right foot. A week after wearing the brace she started having severe pain in left foot along tarsal joint (anterior foot), she stopped wearing brace. she went to see Dr. [name] who recommended she follow up with Dr. Milinda Pointer who did do x-ray which was negative for fracture. She reports still having bilateral foot pain and intermittent swelling; She is walking without assistive device but her walk is antalgic with decreased heel strike;     How long can you sit comfortably?  NA    How long can you stand comfortably?  has pain in feet with initial standing, and will continue to hurt with longer period of standing;     How long can you walk  comfortably?  hurts with most walking; has limited walking; not walking the dog    Diagnostic tests  X-ray negative for fracture;     Patient Stated Goals  improve walking and reduce foot pain;     Currently in Pain?  Yes    Pain Score  4     Pain Location  Foot    Pain Orientation  Right    Pain Descriptors / Indicators  Burning    Pain Type  Chronic pain    Pain Onset  More than a month ago    Pain Frequency  Intermittent    Aggravating Factors   initial standing/prolonged standing/walking    Pain Relieving Factors  rest/ice/pain meds    Effect of Pain on Daily Activities  decreased walking tolerance;     Multiple Pain Sites  Yes    Pain Score  2    Pain Location  Ankle    Pain Orientation  Left    Pain Descriptors / Indicators  Aching     Pain Type  Chronic pain    Pain Onset  More than a month ago    Pain Frequency  Intermittent    Aggravating Factors   tender to touch    Pain Relieving Factors  rest    Effect of Pain on Daily Activities  decreased walking tolerance;          Patient reports decreased swelling bilaterally and less episodes of pain. She reports not sleeping well 2 nights ago and spent most of the day in her pajamas yesterday;    Ankle ROM at start of session: ankle DF: R: 8 degrees, L: 3 degrees  PT performed extensive soft tissue massage including IASTM utilizing edge tool to reduce tightness in right heel and left plantar fascia; utilized manual techniques including cross friction massage. Patient initially reported moderate tenderness to palpation of right heel and left plantar fascia; however following manual therapy, patient reports significant reduction in pain. Patient initially exhibited significant tightness, however following manual therapy, improved tissue extensibility noted;   PT performed soft tissue massage to left calf x4 min with distal to proximal movement for better milking massage to reduce swelling in LLE;  PT also performed grade II-III AP mobs to left talocrual joint to facilitate better ankle ROM 10 sec bouts x4 sets; Patient denies any pain during mobilization;   Following manual therapy, PT applied tape to each foot: RLE: PT applied Mcconnell tape to right plantar fascia for better arch support; LLE: PT applied kinesiotape to anterior foot for DF assist;  PT educated patient on safe tape wear and to remove tape if redness, warmth or itching/discomfort occurs;                       PT Education - 08/30/18 0839    Education Details  HEP reinforced,manual therapy/taping    Person(s) Educated  Patient    Methods  Explanation;Demonstration;Verbal cues    Comprehension  Verbalized understanding;Returned demonstration;Verbal cues required;Need further  instruction       PT Short Term Goals - 08/14/18 1417      PT SHORT TERM GOAL #1   Title  Patient will be adherent to HEP at least 3x a week to improve functional strength and balance for better safety at home.    Time  4    Period  Weeks    Status  New    Target Date  09/11/18  PT SHORT TERM GOAL #2   Title  Patient will report worse pain in right foot of 4/10 over last few days to exhibit improved tolerance with ADLs;     Time  4    Period  Weeks    Status  New    Target Date  09/11/18      PT SHORT TERM GOAL #3   Title  Patient will report worst pain in left foot of 4/10 in last few days to exhibit improved tolerance with ADLs;     Time  4    Period  Weeks    Status  New    Target Date  09/11/18        PT Long Term Goals - 08/14/18 1418      PT LONG TERM GOAL #1   Title  Patient will increase 10 meter walk test to >1.33m/s without antalgic gait, as to improve gait speed for better community ambulation and to reduce fall risk.    Time  8    Period  Weeks    Status  New    Target Date  10/09/18      PT LONG TERM GOAL #2   Title  Patient will be able to sit for 30 min and report <2/10 foot pain upon standing to exhibit improved tolerance with sitting for meals and other ADL tasks.     Time  8    Period  Weeks    Status  New    Target Date  10/09/18      PT LONG TERM GOAL #3   Title  Patient will improve ankle AROM DF >5 degrees bilaterally to improve functional ROM for ADLs such as negotiating stairs.     Time  8    Period  Weeks    Status  New    Target Date  10/09/18      PT LONG TERM GOAL #4   Title  Patient will complete >1000 feet on 6 min walk test without increase in foot pain to improve community ambulator distance for functional tasks and return to PLOF.     Time  8    Period  Weeks    Status  New    Target Date  10/09/18            Plan - 08/30/18 1153    Clinical Impression Statement  Patient reports overall improvement in symptoms,  but still has tenderness to right heel and left metatarsal heads. Patient tolerated manual thearpy well. PT performed soft tissue massage using edge tool for IASTM. Patient exhibited less tenderness in calf of LLE folllowing soft tissue massage; Decreased swelling noted in LLE following manual therapy; PT finished session with tape application, mcconnell tape to right foot for plantar fascia support, and kinesiotape to LLE for ankle DF assist; Patient reports no pain in each foot upon standing; She would benefit from additional skilled PT intervention to improve strength, balance and gait safety; Plan to progress to balance/gait education as foot symptoms improve;     Rehab Potential  Good    Clinical Impairments Affecting Rehab Potential  motivated, has good caregiver support    PT Frequency  2x / week    PT Duration  8 weeks    PT Treatment/Interventions  Cryotherapy;Iontophoresis 4mg /ml Dexamethasone;Moist Heat;Ultrasound;Gait training;Stair training;Functional mobility training;Therapeutic activities;Therapeutic exercise;Balance training;Neuromuscular re-education;Patient/family education;Orthotic Fit/Training;Manual techniques;Passive range of motion;Dry needling;Energy conservation;Splinting;Taping    PT Next Visit Plan  advance HEP, manual, ionto/taping  PT Home Exercise Plan  initiated, see patient instructions    Consulted and Agree with Plan of Care  Patient       Patient will benefit from skilled therapeutic intervention in order to improve the following deficits and impairments:  Abnormal gait, Decreased activity tolerance, Pain, Difficulty walking, Decreased mobility, Decreased balance, Decreased range of motion, Impaired flexibility  Visit Diagnosis: Pain in right foot  Pain in left foot  Difficulty in walking, not elsewhere classified  Unsteadiness on feet  Muscle weakness (generalized)     Problem List Patient Active Problem List   Diagnosis Date Noted  . Left foot  pain 07/17/2018  . Tinnitus, bilateral 11/28/2017  . Abnormal MRI of head 11/14/2017  . Elevated erythrocyte sedimentation rate 11/14/2017  . Right temporal headache 11/14/2017  . Brain mass 09/21/2017  . Ataxia 05/11/2017  . Dizziness 04/06/2017  . PAF (paroxysmal atrial fibrillation) ([place]) 02/03/2017  . DOE (dyspnea on exertion) 12/01/2016  . Acute right-sided low back pain without sciatica 08/26/2016  . Preventative health care 06/02/2016  . Advance directive discussed with patient 06/02/2016  . Achilles tendonitis 03/11/2016  . Hypertension   . DVT, lower extremity, recurrent ([place])   . Type 2 diabetes, controlled, with peripheral neuropathy ([place])   . Generalized osteoarthritis of multiple sites   . GERD (gastroesophageal reflux disease)   . Hyperthyroidism   . Retinal hole of right eye 06/26/2014  . Amblyopia, right 05/08/2014  . Status post intraocular lens implant 05/08/2014    Trotter,Margaret PT, DPT 08/30/2018, 11:56 AM  [place] MAIN [facility] SERVICES [place], [place], [address] Phone: [phone]   Fax:  [phone]  Name: Angela Bentley MRN: [id number] Date of Birth: [birth year]

## 2018-09-04 ENCOUNTER — Ambulatory Visit: Payer: Medicare Other | Admitting: Physical Therapy

## 2018-09-06 ENCOUNTER — Encounter: Payer: Self-pay | Admitting: Physical Therapy

## 2018-09-06 ENCOUNTER — Ambulatory Visit: Payer: Medicare Other | Admitting: Physical Therapy

## 2018-09-06 DIAGNOSIS — M6281 Muscle weakness (generalized): Secondary | ICD-10-CM | POA: Diagnosis not present

## 2018-09-06 DIAGNOSIS — M79672 Pain in left foot: Secondary | ICD-10-CM

## 2018-09-06 DIAGNOSIS — R262 Difficulty in walking, not elsewhere classified: Secondary | ICD-10-CM | POA: Diagnosis not present

## 2018-09-06 DIAGNOSIS — R2681 Unsteadiness on feet: Secondary | ICD-10-CM | POA: Diagnosis not present

## 2018-09-06 DIAGNOSIS — M79671 Pain in right foot: Secondary | ICD-10-CM | POA: Diagnosis not present

## 2018-09-06 NOTE — Therapy (Signed)
[place] MAIN [facility] SERVICES [address] [place], [place], [address] Phone: [phone]   Fax:  [phone]  Physical Therapy Treatment  Patient Details  Name: Angela Bentley MRN: [id number] Date of Birth: Mar 13, 1941 Referring Provider (PT): Dr. [name]   Encounter Date: 09/06/2018  PT End of Session - 09/06/18 1028    Visit Number  5    Number of Visits  17    Date for PT Re-Evaluation  10/09/18    Authorization Type  5/10, Start of care 08/14/18    PT Start Time  0932    PT Stop Time  1018    PT Time Calculation (min)  46 min    Activity Tolerance  Patient tolerated treatment well;No increased pain    Behavior During Therapy  WFL for tasks assessed/performed       Past Medical History:  Diagnosis Date  . Brain tumor ([place])    a. Left intraventricular tumor ->stable by 06/2017 MRI. Followed @ [facility].  . Gait disturbance    a. Unsteady on feet/balance difficulty.  . Generalized osteoarthritis of multiple sites   . GERD (gastroesophageal reflux disease)   . History of DVT (deep vein thrombosis) 2016   estrogen and airflights. Rx with xarelto for 3 months  . History of stress test    a. 11/2016 ETT: No ST/T changes.  Performed to assess PAC/PVC burden with activity ->No ectopy noted.  . Hypertension   . Hyperthyroidism    treated briefly in college  . Insomnia   . Obesity   . PAF (paroxysmal atrial fibrillation) ([place])    a. 12/2016 Event Monitor: brief run of PAF-->CHA2DS2VASc = 5--> Xarelto.  Marland Kitchen Retinal tear of left eye   . Rosacea   . Type 2 diabetes, controlled, with peripheral neuropathy ([place])    a. 11/2016 A1c 7.4.  . Uterine fibroid     Past Surgical History:  Procedure Laterality Date  . CARPAL TUNNEL RELEASE Right    Dr Burney Gauze  . CATARACT EXTRACTION    . RETINAL TEAR REPAIR CRYOTHERAPY     10/09/13, then again 3/15    There were no vitals filed for this visit.  Subjective Assessment - 09/06/18 0940    Subjective  Patient reports increased dizziness/vertigo symptoms this week; she reports taking meclizine with some improvement but is still unsteady; She reports increased soreness today in feet but reports periods of no pain over last week;     Pertinent History  77 yo Female reports history of bilateral foot plantar fasciitis since August 2019 which has been bothering her since; She went to see Dr. Milinda Pointer who did injection in bilateral feet end of September 2019. She was then given soft ankle brace to wear. she reports not really getting relief in right foot. A week after wearing the brace she started having severe pain in left foot along tarsal joint (anterior foot), she stopped wearing brace. she went to see Dr. [name] who recommended she follow up with Dr. Milinda Pointer who did do x-ray which was negative for fracture. She reports still having bilateral foot pain and intermittent swelling; She is walking without assistive device but her walk is antalgic with decreased heel strike;     How long can you sit comfortably?  NA    How long can you stand comfortably?  has pain in feet with initial standing, and will continue to hurt with longer period of standing;     How long can you walk comfortably?  hurts with most walking; has limited walking; not walking the dog    Diagnostic tests  X-ray negative for fracture;     Patient Stated Goals  improve walking and reduce foot pain;     Currently in Pain?  Yes    Pain Score  2     Pain Location  Heel    Pain Orientation  Right    Pain Descriptors / Indicators  Burning    Pain Type  Chronic pain    Pain Onset  More than a month ago    Pain Frequency  Intermittent    Aggravating Factors   initial standing/prolonged standing/walking    Pain Relieving Factors  rest/ice/pain meds    Effect of Pain on Daily Activities  decreased walking tolerance;     Multiple Pain Sites  Yes    Pain Score  0           TREATMENT:   Patient reports decreased swelling  bilaterally and less episodes of pain, however is feeling increased soreness in right heel today;  Ankle ROM at start of session: ankle DF: R: 8 degrees, L: 8degrees  PT performed extensive soft tissue massage including IASTM utilizing edge tool to reduce tightness in right heel; utilized manual techniques including cross friction massage. Patient initially reported moderate tenderness to palpation of right heel; however following manual therapy, patient reports significant reduction in pain. Patient initially exhibited significant tightness, however following manual therapy, improved tissue extensibility noted;   PT performed soft tissue massage to left anterior/tib and peroneals to reduce tightness and discomfort along LLE lower leg x5 min with distal to proximal movement for better milking massage to reduce swelling in LLE;  PT also performed grade II-III AP mobs to left talocrual joint to facilitate better ankle ROM 10 sec bouts x4 sets; Patient denies any pain during mobilization;  Educated patient in LLE ankle red tband strengthening: -DF x15 reps -EV x15 reps Patient required min-moderate verbal/tactile cues for correct exercise technique including to isolate ankle ROM and reduce compensation with LE movement;  PT applied iontophoresis to right heel with 4mg /mL dexamethasone x8 min; PT educated patient on safe wear and precautions;   Patient reports feeling well upon standing;                   PT Education - 09/06/18 1027    Education Details  HEP reinforced, education on iontophoresis wear; manual therapy    Person(s) Educated  Patient    Methods  Explanation;Demonstration;Verbal cues    Comprehension  Verbalized understanding;Returned demonstration;Verbal cues required;Need further instruction       PT Short Term Goals - 08/14/18 1417      PT SHORT TERM GOAL #1   Title  Patient will be adherent to HEP at least 3x a week to improve functional strength and  balance for better safety at home.    Time  4    Period  Weeks    Status  New    Target Date  09/11/18      PT SHORT TERM GOAL #2   Title  Patient will report worse pain in right foot of 4/10 over last few days to exhibit improved tolerance with ADLs;     Time  4    Period  Weeks    Status  New    Target Date  09/11/18      PT SHORT TERM GOAL #3   Title  Patient will report worst pain in  left foot of 4/10 in last few days to exhibit improved tolerance with ADLs;     Time  4    Period  Weeks    Status  New    Target Date  09/11/18        PT Long Term Goals - 08/14/18 1418      PT LONG TERM GOAL #1   Title  Patient will increase 10 meter walk test to >1.24m/s without antalgic gait, as to improve gait speed for better community ambulation and to reduce fall risk.    Time  8    Period  Weeks    Status  New    Target Date  10/09/18      PT LONG TERM GOAL #2   Title  Patient will be able to sit for 30 min and report <2/10 foot pain upon standing to exhibit improved tolerance with sitting for meals and other ADL tasks.     Time  8    Period  Weeks    Status  New    Target Date  10/09/18      PT LONG TERM GOAL #3   Title  Patient will improve ankle AROM DF >5 degrees bilaterally to improve functional ROM for ADLs such as negotiating stairs.     Time  8    Period  Weeks    Status  New    Target Date  10/09/18      PT LONG TERM GOAL #4   Title  Patient will complete >1000 feet on 6 min walk test without increase in foot pain to improve community ambulator distance for functional tasks and return to PLOF.     Time  8    Period  Weeks    Status  New    Target Date  10/09/18            Plan - 09/06/18 1028    Clinical Impression Statement  Patient reports no significant change in foot pain following taping after last session; She reports having periods of no pain and periods of some pain. She reports that her pain is increased after doing a lot of walking; She also had  episode of vertigo earlier this week which has changed her mobility level; PT re-educated in HEP stretches with instruction in positioning; also educated patient in LLE ankle strengthening with tband; She tolerated manual therapy well; overall ankle ROM is improving although stiffness is still felt in LLE ankle during DF/EV; Patient would benefit from additional skilled PT Intervention to improve ROM, ankle stabilization and improve gait mechanics;     Rehab Potential  Good    Clinical Impairments Affecting Rehab Potential  motivated, has good caregiver support    PT Frequency  2x / week    PT Duration  8 weeks    PT Treatment/Interventions  Cryotherapy;Iontophoresis 4mg /ml Dexamethasone;Moist Heat;Ultrasound;Gait training;Stair training;Functional mobility training;Therapeutic activities;Therapeutic exercise;Balance training;Neuromuscular re-education;Patient/family education;Orthotic Fit/Training;Manual techniques;Passive range of motion;Dry needling;Energy conservation;Splinting;Taping    PT Next Visit Plan  advance HEP, manual, ionto/taping    PT Home Exercise Plan  initiated, see patient instructions    Consulted and Agree with Plan of Care  Patient       Patient will benefit from skilled therapeutic intervention in order to improve the following deficits and impairments:  Abnormal gait, Decreased activity tolerance, Pain, Difficulty walking, Decreased mobility, Decreased balance, Decreased range of motion, Impaired flexibility  Visit Diagnosis: Pain in right foot  Pain in left foot  Difficulty  in walking, not elsewhere classified  Unsteadiness on feet  Muscle weakness (generalized)     Problem List Patient Active Problem List   Diagnosis Date Noted  . Left foot pain 07/17/2018  . Tinnitus, bilateral 11/28/2017  . Abnormal MRI of head 11/14/2017  . Elevated erythrocyte sedimentation rate 11/14/2017  . Right temporal headache 11/14/2017  . Brain mass 09/21/2017  . Ataxia  05/11/2017  . Dizziness 04/06/2017  . PAF (paroxysmal atrial fibrillation) ([place]) 02/03/2017  . DOE (dyspnea on exertion) 12/01/2016  . Acute right-sided low back pain without sciatica 08/26/2016  . Preventative health care 06/02/2016  . Advance directive discussed with patient 06/02/2016  . Achilles tendonitis 03/11/2016  . Hypertension   . DVT, lower extremity, recurrent ([place])   . Type 2 diabetes, controlled, with peripheral neuropathy ([place])   . Generalized osteoarthritis of multiple sites   . GERD (gastroesophageal reflux disease)   . Hyperthyroidism   . Retinal hole of right eye 06/26/2014  . Amblyopia, right 05/08/2014  . Status post intraocular lens implant 05/08/2014    Trotter,Margaret PT, DPT 09/06/2018, 10:31 AM  [place] MAIN [facility] SERVICES [place], [place], [address] Phone: [phone]   Fax:  [phone]  Name: Angela Bentley MRN: [id number] Date of Birth: 09-21-1941

## 2018-09-11 ENCOUNTER — Encounter: Payer: Self-pay | Admitting: Physical Therapy

## 2018-09-11 ENCOUNTER — Ambulatory Visit: Payer: Medicare Other | Admitting: Physical Therapy

## 2018-09-11 DIAGNOSIS — M79671 Pain in right foot: Secondary | ICD-10-CM

## 2018-09-11 DIAGNOSIS — R262 Difficulty in walking, not elsewhere classified: Secondary | ICD-10-CM | POA: Diagnosis not present

## 2018-09-11 DIAGNOSIS — M6281 Muscle weakness (generalized): Secondary | ICD-10-CM | POA: Diagnosis not present

## 2018-09-11 DIAGNOSIS — M79672 Pain in left foot: Secondary | ICD-10-CM | POA: Diagnosis not present

## 2018-09-11 DIAGNOSIS — R2681 Unsteadiness on feet: Secondary | ICD-10-CM | POA: Diagnosis not present

## 2018-09-11 NOTE — Therapy (Signed)
[place] MAIN [facility] SERVICES [address] [place], [place], [address] Phone: [phone]   Fax:  [phone]  Physical Therapy Treatment  Patient Details  Name: Angela Bentley MRN: [id number] Date of Birth: Oct 18, 1940 Referring Provider (PT): Dr. [name]   Encounter Date: 09/11/2018  PT End of Session - 09/11/18 1144    Visit Number  6    Number of Visits  17    Date for PT Re-Evaluation  10/09/18    Authorization Type  6/10, Start of care 08/14/18    PT Start Time  1016    PT Stop Time  1105    PT Time Calculation (min)  49 min    Activity Tolerance  Patient tolerated treatment well;No increased pain    Behavior During Therapy  WFL for tasks assessed/performed       Past Medical History:  Diagnosis Date  . Brain tumor ([place])    a. Left intraventricular tumor ->stable by 06/2017 MRI. Followed @ [facility].  . Gait disturbance    a. Unsteady on feet/balance difficulty.  . Generalized osteoarthritis of multiple sites   . GERD (gastroesophageal reflux disease)   . History of DVT (deep vein thrombosis) 2016   estrogen and airflights. Rx with xarelto for 3 months  . History of stress test    a. 11/2016 ETT: No ST/T changes.  Performed to assess PAC/PVC burden with activity ->No ectopy noted.  . Hypertension   . Hyperthyroidism    treated briefly in college  . Insomnia   . Obesity   . PAF (paroxysmal atrial fibrillation) ([place])    a. 12/2016 Event Monitor: brief run of PAF-->CHA2DS2VASc = 5--> Xarelto.  Marland Kitchen Retinal tear of left eye   . Rosacea   . Type 2 diabetes, controlled, with peripheral neuropathy ([place])    a. 11/2016 A1c 7.4.  . Uterine fibroid     Past Surgical History:  Procedure Laterality Date  . CARPAL TUNNEL RELEASE Right    Dr Burney Gauze  . CATARACT EXTRACTION    . RETINAL TEAR REPAIR CRYOTHERAPY     10/09/13, then again 3/15    There were no vitals filed for this visit.  Subjective Assessment - 09/11/18 1025     Subjective  Patient reports feeling better in her feet. She reports, "I feel that my arch support has finally gotten right and my feet are feeling better." She does report increased low back pain which is likely related to overdoing it.     Pertinent History  77 yo Female reports history of bilateral foot plantar fasciitis since August 2019 which has been bothering her since; She went to see Dr. Milinda Pointer who did injection in bilateral feet end of September 2019. She was then given soft ankle brace to wear. she reports not really getting relief in right foot. A week after wearing the brace she started having severe pain in left foot along tarsal joint (anterior foot), she stopped wearing brace. she went to see Dr. [name] who recommended she follow up with Dr. Milinda Pointer who did do x-ray which was negative for fracture. She reports still having bilateral foot pain and intermittent swelling; She is walking without assistive device but her walk is antalgic with decreased heel strike;     How long can you sit comfortably?  NA    How long can you stand comfortably?  has pain in feet with initial standing, and will continue to hurt with longer period of standing;  How long can you walk comfortably?  hurts with most walking; has limited walking; not walking the dog    Diagnostic tests  X-ray negative for fracture;     Patient Stated Goals  improve walking and reduce foot pain;     Currently in Pain?  Yes    Pain Score  6     Pain Location  Back    Pain Orientation  Lower    Pain Descriptors / Indicators  Aching;Sore    Pain Type  Chronic pain    Pain Onset  More than a month ago    Pain Frequency  Intermittent    Aggravating Factors   prolonged standing/walking    Pain Relieving Factors  ice/rest    Effect of Pain on Daily Activities  decreased activity tolerance;     Multiple Pain Sites  No         TREATMENT: ROM prior to session: DF: R: 8 degrees, L: 3 degrees   PT performed extensive soft tissue  massage including IASTM utilizing edge tool to reduce tightness in right heel; utilized manual techniques including cross friction massage. Patient initially reported moderate tenderness to palpation of right heel; however following manual therapy, patient reports significant reduction in pain. x21 min, Patient initially exhibited significant tightness, however following manual therapy, improved tissue extensibility noted;    Educated patient in LLE ankle red tband strengthening: -DF x15 reps -EV x15 reps Patient required min-moderate verbal/tactile cues for correct exercise technique including to isolate ankle ROM and reduce compensation with LE movement;  Provided written Handout for better adherence;  PT applied iontophoresis to right heel with 4mg /mL dexamethasone x8 min; PT educated patient on safe wear and precautions;   Patient reports feeling well upon standing;                     PT Education - 09/11/18 1144    Education Details  HEPadvanced, manual therapy, iontophoresis wear;    Person(s) Educated  Patient    Methods  Explanation;Verbal cues    Comprehension  Verbalized understanding;Returned demonstration;Verbal cues required;Need further instruction       PT Short Term Goals - 08/14/18 1417      PT SHORT TERM GOAL #1   Title  Patient will be adherent to HEP at least 3x a week to improve functional strength and balance for better safety at home.    Time  4    Period  Weeks    Status  New    Target Date  09/11/18      PT SHORT TERM GOAL #2   Title  Patient will report worse pain in right foot of 4/10 over last few days to exhibit improved tolerance with ADLs;     Time  4    Period  Weeks    Status  New    Target Date  09/11/18      PT SHORT TERM GOAL #3   Title  Patient will report worst pain in left foot of 4/10 in last few days to exhibit improved tolerance with ADLs;     Time  4    Period  Weeks    Status  New    Target Date  09/11/18         PT Long Term Goals - 08/14/18 1418      PT LONG TERM GOAL #1   Title  Patient will increase 10 meter walk test to >1.88m/s without antalgic gait, as to  improve gait speed for better community ambulation and to reduce fall risk.    Time  8    Period  Weeks    Status  New    Target Date  10/09/18      PT LONG TERM GOAL #2   Title  Patient will be able to sit for 30 min and report <2/10 foot pain upon standing to exhibit improved tolerance with sitting for meals and other ADL tasks.     Time  8    Period  Weeks    Status  New    Target Date  10/09/18      PT LONG TERM GOAL #3   Title  Patient will improve ankle AROM DF >5 degrees bilaterally to improve functional ROM for ADLs such as negotiating stairs.     Time  8    Period  Weeks    Status  New    Target Date  10/09/18      PT LONG TERM GOAL #4   Title  Patient will complete >1000 feet on 6 min walk test without increase in foot pain to improve community ambulator distance for functional tasks and return to PLOF.     Time  8    Period  Weeks    Status  New    Target Date  10/09/18            Plan - 09/11/18 1303    Clinical Impression Statement  Patient is responding well to treatment. Overall she reports less pain in right foot over last few days. She responded well to iontophoresis with pain reduction reported. Patient also reports less discomfort after adjusting her arch supports in her shoes. She does continue to have tightness in right heel which is tender to touch; She also reports unsteadiness as related to episodes of vertigo and dizziness over last few weeks. Patient would benefit from additional skilled PT Intervention to reduce foot pain and transition to balance exercise to improve overall steadiness;     Rehab Potential  Good    Clinical Impairments Affecting Rehab Potential  motivated, has good caregiver support    PT Frequency  2x / week    PT Duration  8 weeks    PT Treatment/Interventions   Cryotherapy;Iontophoresis 4mg /ml Dexamethasone;Moist Heat;Ultrasound;Gait training;Stair training;Functional mobility training;Therapeutic activities;Therapeutic exercise;Balance training;Neuromuscular re-education;Patient/family education;Orthotic Fit/Training;Manual techniques;Passive range of motion;Dry needling;Energy conservation;Splinting;Taping    PT Next Visit Plan  advance HEP, manual, ionto/taping    PT Home Exercise Plan  initiated, see patient instructions    Consulted and Agree with Plan of Care  Patient       Patient will benefit from skilled therapeutic intervention in order to improve the following deficits and impairments:  Abnormal gait, Decreased activity tolerance, Pain, Difficulty walking, Decreased mobility, Decreased balance, Decreased range of motion, Impaired flexibility  Visit Diagnosis: Pain in right foot  Pain in left foot  Difficulty in walking, not elsewhere classified  Unsteadiness on feet  Muscle weakness (generalized)     Problem List Patient Active Problem List   Diagnosis Date Noted  . Left foot pain 07/17/2018  . Tinnitus, bilateral 11/28/2017  . Abnormal MRI of head 11/14/2017  . Elevated erythrocyte sedimentation rate 11/14/2017  . Right temporal headache 11/14/2017  . Brain mass 09/21/2017  . Ataxia 05/11/2017  . Dizziness 04/06/2017  . PAF (paroxysmal atrial fibrillation) ([place]) 02/03/2017  . DOE (dyspnea on exertion) 12/01/2016  . Acute right-sided low back pain without sciatica 08/26/2016  .  Preventative health care 06/02/2016  . Advance directive discussed with patient 06/02/2016  . Achilles tendonitis 03/11/2016  . Hypertension   . DVT, lower extremity, recurrent ([place])   . Type 2 diabetes, controlled, with peripheral neuropathy ([place])   . Generalized osteoarthritis of multiple sites   . GERD (gastroesophageal reflux disease)   . Hyperthyroidism   . Retinal hole of right eye 06/26/2014  . Amblyopia, right 05/08/2014  . Status  post intraocular lens implant 05/08/2014    Elgin Carn,MargaretPT, DPT 09/11/2018, 2:58 PM  [place] MAIN [facility] SERVICES [address] [place], [place], [address] Phone: [phone]   Fax:  [phone]  Name: Angela Bentley MRN: [id number] Date of Birth: [birth year]

## 2018-09-11 NOTE — Patient Instructions (Addendum)
Eversion: Resisted    With left foot in tubing loop, hold tubing around other foot to resist and turn foot out. Repeat ___15_ times per set. Do _2___ sets per session. Do __2__ sessions per day.  http://orth.exer.us/15    Dorsiflexion: Resisted    Facing anchor, tubing around left foot, pull toward face. You do not have to have a ball or pad under your foot;  Repeat _15___ times per set. Do _2___ sets per session. Do __2__ sessions per day.  http://orth.exer.us/9   Copyright  VHI. All rights reserved.

## 2018-09-13 ENCOUNTER — Encounter: Payer: Self-pay | Admitting: Physical Therapy

## 2018-09-13 ENCOUNTER — Ambulatory Visit: Payer: Medicare Other | Admitting: Physical Therapy

## 2018-09-13 DIAGNOSIS — M79672 Pain in left foot: Secondary | ICD-10-CM

## 2018-09-13 DIAGNOSIS — R2681 Unsteadiness on feet: Secondary | ICD-10-CM | POA: Diagnosis not present

## 2018-09-13 DIAGNOSIS — R262 Difficulty in walking, not elsewhere classified: Secondary | ICD-10-CM

## 2018-09-13 DIAGNOSIS — M79671 Pain in right foot: Secondary | ICD-10-CM

## 2018-09-13 DIAGNOSIS — M6281 Muscle weakness (generalized): Secondary | ICD-10-CM | POA: Diagnosis not present

## 2018-09-13 NOTE — Therapy (Signed)
[place] MAIN [facility] SERVICES [address] [place], [place], [address] Phone: [phone]   Fax:  [phone]  Physical Therapy Treatment  Patient Details  Name: Angela Bentley MRN: [id number] Date of Birth: 1941-07-23 Referring Provider (PT): Dr. [name]   Encounter Date: 09/13/2018  PT End of Session - 09/13/18 0942    Visit Number  7    Number of Visits  17    Date for PT Re-Evaluation  10/09/18    Authorization Type  7/10, Start of care 08/14/18    PT Start Time  0932    PT Stop Time  1015    PT Time Calculation (min)  43 min    Activity Tolerance  Patient tolerated treatment well;No increased pain    Behavior During Therapy  WFL for tasks assessed/performed       Past Medical History:  Diagnosis Date  . Brain tumor ([place])    a. Left intraventricular tumor ->stable by 06/2017 MRI. Followed @ [facility].  . Gait disturbance    a. Unsteady on feet/balance difficulty.  . Generalized osteoarthritis of multiple sites   . GERD (gastroesophageal reflux disease)   . History of DVT (deep vein thrombosis) 2016   estrogen and airflights. Rx with xarelto for 3 months  . History of stress test    a. 11/2016 ETT: No ST/T changes.  Performed to assess PAC/PVC burden with activity ->No ectopy noted.  . Hypertension   . Hyperthyroidism    treated briefly in college  . Insomnia   . Obesity   . PAF (paroxysmal atrial fibrillation) ([place])    a. 12/2016 Event Monitor: brief run of PAF-->CHA2DS2VASc = 5--> Xarelto.  Marland Kitchen Retinal tear of left eye   . Rosacea   . Type 2 diabetes, controlled, with peripheral neuropathy ([place])    a. 11/2016 A1c 7.4.  . Uterine fibroid     Past Surgical History:  Procedure Laterality Date  . CARPAL TUNNEL RELEASE Right    Dr Burney Gauze  . CATARACT EXTRACTION    . RETINAL TEAR REPAIR CRYOTHERAPY     10/09/13, then again 3/15    There were no vitals filed for this visit.  Subjective Assessment - 09/13/18 0940    Subjective  Pt reports no pain in right heel yesterday and reports that the patch wore well; She reports mild pain this morning in both feet;     Pertinent History  77 yo Female reports history of bilateral foot plantar fasciitis since August 2019 which has been bothering her since; She went to see Dr. Milinda Pointer who did injection in bilateral feet end of September 2019. She was then given soft ankle brace to wear. she reports not really getting relief in right foot. A week after wearing the brace she started having severe pain in left foot along tarsal joint (anterior foot), she stopped wearing brace. she went to see Dr. [name] who recommended she follow up with Dr. Milinda Pointer who did do x-ray which was negative for fracture. She reports still having bilateral foot pain and intermittent swelling; She is walking without assistive device but her walk is antalgic with decreased heel strike;     How long can you sit comfortably?  NA    How long can you stand comfortably?  has pain in feet with initial standing, and will continue to hurt with longer period of standing;     How long can you walk comfortably?  hurts with most walking; has limited walking; not walking  the dog    Diagnostic tests  X-ray negative for fracture;     Patient Stated Goals  improve walking and reduce foot pain;     Currently in Pain?  Yes    Pain Score  3     Pain Location  Foot    Pain Orientation  Right    Pain Descriptors / Indicators  Aching;Sore    Pain Type  Acute pain    Pain Onset  More than a month ago    Pain Frequency  Intermittent    Aggravating Factors   worse with standing    Pain Relieving Factors  rest/ice    Effect of Pain on Daily Activities  decreased activity tolerance;     Multiple Pain Sites  Yes    Pain Score  3    Pain Location  Foot    Pain Orientation  Left    Pain Descriptors / Indicators  Aching;Sore    Pain Type  Chronic pain    Pain Radiating Towards  along top/side of left foot    Pain Onset  1 to 4  weeks ago    Pain Frequency  Intermittent    Aggravating Factors   tender to touch    Pain Relieving Factors  rest    Effect of Pain on Daily Activities  decreased walking tolerance;           TREATMENT:   PT performed extensive soft tissue massage including IASTM utilizing edge tool to reduce tightness in right heel;utilized manual techniques including cross friction massage. Patient initially reported moderate tenderness to palpation of right heel;however following manual therapy, patient reports significant reduction in pain. x18 min, Patient initially exhibited significant tightness, however following manual therapy, improved tissue extensibility noted;   Grade II-III AP Mobs to left talocrual joint 10 sec bouts x3 sets Passive LLE ankle DF calf stretch 20 sec hold x2 reps; Patient reports moderate stretch but denies any pain;  Educated patient in LLE ankle green tband strengthening: -DF x15 reps -EV x15 reps -IV x15 reps Patient required min-moderate verbal/tactile cues for correct exercise techniqueincluding to isolate ankle ROM and reduce compensation with LE movement;  Provided written Handout for better adherence;  PT applied iontophoresis to right heel with 4mg /mL dexamethasone x8 min; PT educated patient on safe wear and precautions;   Patient reports feeling well upon standing;                         PT Education - 09/13/18 0942    Education Details  HEP reinforced, manual therapy, iontophorese    Person(s) Educated  Patient    Methods  Explanation;Verbal cues    Comprehension  Verbalized understanding;Verbal cues required       PT Short Term Goals - 08/14/18 1417      PT SHORT TERM GOAL #1   Title  Patient will be adherent to HEP at least 3x a week to improve functional strength and balance for better safety at home.    Time  4    Period  Weeks    Status  New    Target Date  09/11/18      PT SHORT TERM GOAL #2   Title   Patient will report worse pain in right foot of 4/10 over last few days to exhibit improved tolerance with ADLs;     Time  4    Period  Weeks    Status  New  Target Date  09/11/18      PT SHORT TERM GOAL #3   Title  Patient will report worst pain in left foot of 4/10 in last few days to exhibit improved tolerance with ADLs;     Time  4    Period  Weeks    Status  New    Target Date  09/11/18        PT Long Term Goals - 08/14/18 1418      PT LONG TERM GOAL #1   Title  Patient will increase 10 meter walk test to >1.35m/s without antalgic gait, as to improve gait speed for better community ambulation and to reduce fall risk.    Time  8    Period  Weeks    Status  New    Target Date  10/09/18      PT LONG TERM GOAL #2   Title  Patient will be able to sit for 30 min and report <2/10 foot pain upon standing to exhibit improved tolerance with sitting for meals and other ADL tasks.     Time  8    Period  Weeks    Status  New    Target Date  10/09/18      PT LONG TERM GOAL #3   Title  Patient will improve ankle AROM DF >5 degrees bilaterally to improve functional ROM for ADLs such as negotiating stairs.     Time  8    Period  Weeks    Status  New    Target Date  10/09/18      PT LONG TERM GOAL #4   Title  Patient will complete >1000 feet on 6 min walk test without increase in foot pain to improve community ambulator distance for functional tasks and return to PLOF.     Time  8    Period  Weeks    Status  New    Target Date  10/09/18            Plan - 09/13/18 1312    Clinical Impression Statement  Patient is resonding well; She reports less pain following iontophoresis treatment. Patient does continue to have tenderness along right heel especially center and laterally. Following session, she reported no pain in RLE; Patient tolerated joint mobilization on LLE well reporting no discomfort in left foot at end of session; Reinforced HEP; Patient would benefit from  additional skilled PT intervention to improve strength, balance and gait safety while reducing foot pain;     Rehab Potential  Good    Clinical Impairments Affecting Rehab Potential  motivated, has good caregiver support    PT Frequency  2x / week    PT Duration  8 weeks    PT Treatment/Interventions  Cryotherapy;Iontophoresis 4mg /ml Dexamethasone;Moist Heat;Ultrasound;Gait training;Stair training;Functional mobility training;Therapeutic activities;Therapeutic exercise;Balance training;Neuromuscular re-education;Patient/family education;Orthotic Fit/Training;Manual techniques;Passive range of motion;Dry needling;Energy conservation;Splinting;Taping    PT Next Visit Plan  advance HEP, manual, ionto/taping    PT Home Exercise Plan  initiated, see patient instructions    Consulted and Agree with Plan of Care  Patient       Patient will benefit from skilled therapeutic intervention in order to improve the following deficits and impairments:  Abnormal gait, Decreased activity tolerance, Pain, Difficulty walking, Decreased mobility, Decreased balance, Decreased range of motion, Impaired flexibility  Visit Diagnosis: Pain in right foot  Pain in left foot  Difficulty in walking, not elsewhere classified  Unsteadiness on feet  Muscle weakness (generalized)  Problem List Patient Active Problem List   Diagnosis Date Noted  . Left foot pain 07/17/2018  . Tinnitus, bilateral 11/28/2017  . Abnormal MRI of head 11/14/2017  . Elevated erythrocyte sedimentation rate 11/14/2017  . Right temporal headache 11/14/2017  . Brain mass 09/21/2017  . Ataxia 05/11/2017  . Dizziness 04/06/2017  . PAF (paroxysmal atrial fibrillation) ([place]) 02/03/2017  . DOE (dyspnea on exertion) 12/01/2016  . Acute right-sided low back pain without sciatica 08/26/2016  . Preventative health care 06/02/2016  . Advance directive discussed with patient 06/02/2016  . Achilles tendonitis 03/11/2016  . Hypertension   .  DVT, lower extremity, recurrent ([place])   . Type 2 diabetes, controlled, with peripheral neuropathy ([place])   . Generalized osteoarthritis of multiple sites   . GERD (gastroesophageal reflux disease)   . Hyperthyroidism   . Retinal hole of right eye 06/26/2014  . Amblyopia, right 05/08/2014  . Status post intraocular lens implant 05/08/2014    Dereck Agerton PT, DPT 09/13/2018, 1:33 PM  [place] MAIN [facility] SERVICES [address] [place], [place], [address] Phone: [phone]   Fax:  [phone]  Name: Angela Bentley MRN: [id number] Date of Birth: Feb 21, 1941

## 2018-09-20 ENCOUNTER — Encounter: Payer: Self-pay | Admitting: Physical Therapy

## 2018-09-20 ENCOUNTER — Ambulatory Visit: Payer: Medicare Other | Admitting: Physical Therapy

## 2018-09-20 DIAGNOSIS — R262 Difficulty in walking, not elsewhere classified: Secondary | ICD-10-CM | POA: Diagnosis not present

## 2018-09-20 DIAGNOSIS — M79672 Pain in left foot: Secondary | ICD-10-CM

## 2018-09-20 DIAGNOSIS — M79671 Pain in right foot: Secondary | ICD-10-CM

## 2018-09-20 DIAGNOSIS — M6281 Muscle weakness (generalized): Secondary | ICD-10-CM | POA: Diagnosis not present

## 2018-09-20 DIAGNOSIS — R2681 Unsteadiness on feet: Secondary | ICD-10-CM

## 2018-09-21 NOTE — Therapy (Signed)
[place] MAIN [facility] SERVICES [address] [place], [place], [address] Phone: [phone]   Fax:  [phone]  Physical Therapy Treatment  Patient Details  Name: Angela Bentley MRN: [id number] Date of Birth: 1940-12-07 Referring Provider (PT): Dr. [name]   Encounter Date: 09/20/2018  PT End of Session - 09/20/18 1601    Visit Number  8    Number of Visits  17    Date for PT Re-Evaluation  10/09/18    Authorization Type  8/10, Start of care 08/14/18    PT Start Time  3818    PT Stop Time  2993    PT Time Calculation (min)  40 min    Activity Tolerance  Patient tolerated treatment well;No increased pain    Behavior During Therapy  WFL for tasks assessed/performed       Past Medical History:  Diagnosis Date  . Brain tumor ([place])    a. Left intraventricular tumor ->stable by 06/2017 MRI. Followed @ [facility].  . Gait disturbance    a. Unsteady on feet/balance difficulty.  . Generalized osteoarthritis of multiple sites   . GERD (gastroesophageal reflux disease)   . History of DVT (deep vein thrombosis) 2016   estrogen and airflights. Rx with xarelto for 3 months  . History of stress test    a. 11/2016 ETT: No ST/T changes.  Performed to assess PAC/PVC burden with activity ->No ectopy noted.  . Hypertension   . Hyperthyroidism    treated briefly in college  . Insomnia   . Obesity   . PAF (paroxysmal atrial fibrillation) ([place])    a. 12/2016 Event Monitor: brief run of PAF-->CHA2DS2VASc = 5--> Xarelto.  Marland Kitchen Retinal tear of left eye   . Rosacea   . Type 2 diabetes, controlled, with peripheral neuropathy ([place])    a. 11/2016 A1c 7.4.  . Uterine fibroid     Past Surgical History:  Procedure Laterality Date  . CARPAL TUNNEL RELEASE Right    Dr Burney Gauze  . CATARACT EXTRACTION    . RETINAL TEAR REPAIR CRYOTHERAPY     10/09/13, then again 3/15    There were no vitals filed for this visit.  Subjective Assessment - 09/20/18 1606    Subjective  Patient states that she doesn't feel like she's getting much better. She continues to have some burning in the R heel. Patient stood for 3 hours doing food prep and was extremely sore and painful afterwards.     Pertinent History  77 yo Female reports history of bilateral foot plantar fasciitis since August 2019 which has been bothering her since; She went to see Dr. Milinda Pointer who did injection in bilateral feet end of September 2019. She was then given soft ankle brace to wear. she reports not really getting relief in right foot. A week after wearing the brace she started having severe pain in left foot along tarsal joint (anterior foot), she stopped wearing brace. she went to see Dr. [name] who recommended she follow up with Dr. Milinda Pointer who did do x-ray which was negative for fracture. She reports still having bilateral foot pain and intermittent swelling; She is walking without assistive device but her walk is antalgic with decreased heel strike;     How long can you sit comfortably?  NA    How long can you stand comfortably?  has pain in feet with initial standing, and will continue to hurt with longer period of standing;     How long can you  walk comfortably?  hurts with most walking; has limited walking; not walking the dog    Diagnostic tests  X-ray negative for fracture;     Patient Stated Goals  improve walking and reduce foot pain;     Currently in Pain?  Yes    Pain Score  3     Pain Location  Foot    Pain Orientation  Right;Left    Pain Descriptors / Indicators  Aching;Burning    Pain Type  Acute pain    Pain Onset  More than a month ago    Pain Onset  1 to 4 weeks ago        TREATMENT:  Manual Interventions:  STM/MFR to B plantar surfaces of the feet Grade II-III AP Mobs to left talocrual joint 10 sec bouts x3 sets Grade II-III superior glides of left distal fibula 10 sec x3 sets Passive BLE ankle DF calf stretch 30 sec hold x2 reps; Patient reports moderate stretch  but denies any pain;   Therapeutic Exercise: BLE ankle green tband strengthening: -PF x10 reps -EV x5 reps -IV x5 reps Moderate VCs for prevention of compensations and to improve motor control.   Considerable patient education provided on the importance of activity modification and graded activity as a part of daily activities in order to prevent increased pain and decreased function.    PT Education - 09/21/18 1018    Education Details  HEP modifications, activity modification, graded activity, exercise technique    Person(s) Educated  Patient    Methods  Explanation;Demonstration    Comprehension  Verbalized understanding;Need further instruction;Returned demonstration       PT Short Term Goals - 08/14/18 1417      PT SHORT TERM GOAL #1   Title  Patient will be adherent to HEP at least 3x a week to improve functional strength and balance for better safety at home.    Time  4    Period  Weeks    Status  New    Target Date  09/11/18      PT SHORT TERM GOAL #2   Title  Patient will report worse pain in right foot of 4/10 over last few days to exhibit improved tolerance with ADLs;     Time  4    Period  Weeks    Status  New    Target Date  09/11/18      PT SHORT TERM GOAL #3   Title  Patient will report worst pain in left foot of 4/10 in last few days to exhibit improved tolerance with ADLs;     Time  4    Period  Weeks    Status  New    Target Date  09/11/18        PT Long Term Goals - 08/14/18 1418      PT LONG TERM GOAL #1   Title  Patient will increase 10 meter walk test to >1.50m/s without antalgic gait, as to improve gait speed for better community ambulation and to reduce fall risk.    Time  8    Period  Weeks    Status  New    Target Date  10/09/18      PT LONG TERM GOAL #2   Title  Patient will be able to sit for 30 min and report <2/10 foot pain upon standing to exhibit improved tolerance with sitting for meals and other ADL tasks.     Time  8  Period  Weeks    Status  New    Target Date  10/09/18      PT LONG TERM GOAL #3   Title  Patient will improve ankle AROM DF >5 degrees bilaterally to improve functional ROM for ADLs such as negotiating stairs.     Time  8    Period  Weeks    Status  New    Target Date  10/09/18      PT LONG TERM GOAL #4   Title  Patient will complete >1000 feet on 6 min walk test without increase in foot pain to improve community ambulator distance for functional tasks and return to PLOF.     Time  8    Period  Weeks    Status  New    Target Date  10/09/18            Plan - 09/21/18 1019    Clinical Impression Statement  Patient continues to benefit from manual therapy interventions with decreased symptoms immediately following. Patient educated extensively on importance of grading activity to prevent setbacks and allow for the development of increased extrinsic and instrinsic foot strength for improved pain and mobility. Patient will continue to benefit from skilled therapeutic intervention to address deficits in strength, pain, gait, and mobility in order to improve overall QOL and return to PLOF.    Rehab Potential  Good    Clinical Impairments Affecting Rehab Potential  motivated, has good caregiver support    PT Frequency  2x / week    PT Duration  8 weeks    PT Treatment/Interventions  Cryotherapy;Iontophoresis 4mg /ml Dexamethasone;Moist Heat;Ultrasound;Gait training;Stair training;Functional mobility training;Therapeutic activities;Therapeutic exercise;Balance training;Neuromuscular re-education;Patient/family education;Orthotic Fit/Training;Manual techniques;Passive range of motion;Dry needling;Energy conservation;Splinting;Taping    PT Next Visit Plan  advance HEP, manual, ionto/taping    PT Home Exercise Plan  initiated, see patient instructions    Consulted and Agree with Plan of Care  Patient       Patient will benefit from skilled therapeutic intervention in order to improve the  following deficits and impairments:  Abnormal gait, Decreased activity tolerance, Pain, Difficulty walking, Decreased mobility, Decreased balance, Decreased range of motion, Impaired flexibility  Visit Diagnosis: Pain in right foot  Pain in left foot  Difficulty in walking, not elsewhere classified  Unsteadiness on feet  Muscle weakness (generalized)     Problem List Patient Active Problem List   Diagnosis Date Noted  . Left foot pain 07/17/2018  . Tinnitus, bilateral 11/28/2017  . Abnormal MRI of head 11/14/2017  . Elevated erythrocyte sedimentation rate 11/14/2017  . Right temporal headache 11/14/2017  . Brain mass 09/21/2017  . Ataxia 05/11/2017  . Dizziness 04/06/2017  . PAF (paroxysmal atrial fibrillation) ([place]) 02/03/2017  . DOE (dyspnea on exertion) 12/01/2016  . Acute right-sided low back pain without sciatica 08/26/2016  . Preventative health care 06/02/2016  . Advance directive discussed with patient 06/02/2016  . Achilles tendonitis 03/11/2016  . Hypertension   . DVT, lower extremity, recurrent ([place])   . Type 2 diabetes, controlled, with peripheral neuropathy ([place])   . Generalized osteoarthritis of multiple sites   . GERD (gastroesophageal reflux disease)   . Hyperthyroidism   . Retinal hole of right eye 06/26/2014  . Amblyopia, right 05/08/2014  . Status post intraocular lens implant 05/08/2014   Myles Gip PT, DPT [phone] 09/21/2018, 10:23 AM  [place] MAIN [facility] SERVICES [address] [place], [place], [address] Phone: [phone]   Fax:  [phone]  Name: Angela Bentley MRN: [id number] Date of Birth: 03/26/1941

## 2018-09-24 ENCOUNTER — Encounter: Payer: Self-pay | Admitting: Physical Therapy

## 2018-09-24 ENCOUNTER — Ambulatory Visit: Payer: Medicare Other | Admitting: Physical Therapy

## 2018-09-24 DIAGNOSIS — M79671 Pain in right foot: Secondary | ICD-10-CM

## 2018-09-24 DIAGNOSIS — R262 Difficulty in walking, not elsewhere classified: Secondary | ICD-10-CM

## 2018-09-24 DIAGNOSIS — R2681 Unsteadiness on feet: Secondary | ICD-10-CM

## 2018-09-24 DIAGNOSIS — M6281 Muscle weakness (generalized): Secondary | ICD-10-CM

## 2018-09-24 DIAGNOSIS — M79672 Pain in left foot: Secondary | ICD-10-CM

## 2018-09-25 NOTE — Therapy (Signed)
[place] MAIN [facility] SERVICES [address] [place], [place], [address] Phone: [phone]   Fax:  [phone]  Physical Therapy Treatment  Patient Details  Name: Angela Bentley MRN: [id number] Date of Birth: 12/16/1940 Referring Provider (PT): Dr. [name]   Encounter Date: 09/24/2018  PT End of Session - 09/24/18 1425    Visit Number  9    Number of Visits  17    Date for PT Re-Evaluation  10/09/18    Authorization Type  9/10, Start of care 08/14/18    PT Start Time  5170    PT Stop Time  1430    PT Time Calculation (min)  45 min    Activity Tolerance  Patient tolerated treatment well;No increased pain    Behavior During Therapy  WFL for tasks assessed/performed       Past Medical History:  Diagnosis Date  . Brain tumor ([facility])    a. Left intraventricular tumor ->stable by 06/2017 MRI. Followed @ [facility].  . Gait disturbance    a. Unsteady on feet/balance difficulty.  . Generalized osteoarthritis of multiple sites   . GERD (gastroesophageal reflux disease)   . History of DVT (deep vein thrombosis) 2016   estrogen and airflights. Rx with xarelto for 3 months  . History of stress test    a. 11/2016 ETT: No ST/T changes.  Performed to assess PAC/PVC burden with activity ->No ectopy noted.  . Hypertension   . Hyperthyroidism    treated briefly in college  . Insomnia   . Obesity   . PAF (paroxysmal atrial fibrillation) ([place])    a. 12/2016 Event Monitor: brief run of PAF-->CHA2DS2VASc = 5--> Xarelto.  Marland Kitchen Retinal tear of left eye   . Rosacea   . Type 2 diabetes, controlled, with peripheral neuropathy ([place])    a. 11/2016 A1c 7.4.  . Uterine fibroid     Past Surgical History:  Procedure Laterality Date  . CARPAL TUNNEL RELEASE Right    Dr Burney Gauze  . CATARACT EXTRACTION    . RETINAL TEAR REPAIR CRYOTHERAPY     10/09/13, then again 3/15    There were no vitals filed for this visit.  Subjective Assessment - 09/24/18 1406    Subjective  Patient reports that she has increased her ice massages and decreased the frequency of her stretching and feels it has reduced her pain. She continues to walk the dog for short distances (250 feet) and has had no pain in her feet for the past two days.     Pertinent History  77 yo Female reports history of bilateral foot plantar fasciitis since August 2019 which has been bothering her since; She went to see Dr. Milinda Pointer who did injection in bilateral feet end of September 2019. She was then given soft ankle brace to wear. she reports not really getting relief in right foot. A week after wearing the brace she started having severe pain in left foot along tarsal joint (anterior foot), she stopped wearing brace. she went to see Dr. [name] who recommended she follow up with Dr. Milinda Pointer who did do x-ray which was negative for fracture. She reports still having bilateral foot pain and intermittent swelling; She is walking without assistive device but her walk is antalgic with decreased heel strike;     How long can you sit comfortably?  NA    How long can you stand comfortably?  has pain in feet with initial standing, and will continue to hurt with longer  period of standing;     How long can you walk comfortably?  hurts with most walking; has limited walking; not walking the dog    Diagnostic tests  X-ray negative for fracture;     Patient Stated Goals  improve walking and reduce foot pain;     Currently in Pain?  No/denies    Pain Score  0-No pain    Pain Onset  More than a month ago    Pain Onset  1 to 4 weeks ago       TREATMENT  Therapeutic Exercise (long sitting) GTB, BLE, ankle PF, ankle eversion x15; VCs for controlled movement with no compensations from the hip or knee. Seated Towel scrunches, BLE, 2x 1 min each, VCs for isolated foot movement Seated Towel windshield wipers, BLE, x10 each direction, VCs for isolated foot movement  Patient educated extensively on grading activity and  incorporating strengthening in order to maintain increased mobility and decreased pain state.     PT Education - 09/24/18 1423    Education Details  exercise technique, strengthening to improve ROM    Person(s) Educated  Patient    Methods  Explanation;Demonstration    Comprehension  Verbalized understanding;Need further instruction;Returned demonstration       PT Short Term Goals - 08/14/18 1417      PT SHORT TERM GOAL #1   Title  Patient will be adherent to HEP at least 3x a week to improve functional strength and balance for better safety at home.    Time  4    Period  Weeks    Status  New    Target Date  09/11/18      PT SHORT TERM GOAL #2   Title  Patient will report worse pain in right foot of 4/10 over last few days to exhibit improved tolerance with ADLs;     Time  4    Period  Weeks    Status  New    Target Date  09/11/18      PT SHORT TERM GOAL #3   Title  Patient will report worst pain in left foot of 4/10 in last few days to exhibit improved tolerance with ADLs;     Time  4    Period  Weeks    Status  New    Target Date  09/11/18        PT Long Term Goals - 08/14/18 1418      PT LONG TERM GOAL #1   Title  Patient will increase 10 meter walk test to >1.64m/s without antalgic gait, as to improve gait speed for better community ambulation and to reduce fall risk.    Time  8    Period  Weeks    Status  New    Target Date  10/09/18      PT LONG TERM GOAL #2   Title  Patient will be able to sit for 30 min and report <2/10 foot pain upon standing to exhibit improved tolerance with sitting for meals and other ADL tasks.     Time  8    Period  Weeks    Status  New    Target Date  10/09/18      PT LONG TERM GOAL #3   Title  Patient will improve ankle AROM DF >5 degrees bilaterally to improve functional ROM for ADLs such as negotiating stairs.     Time  8    Period  Weeks  Status  New    Target Date  10/09/18      PT LONG TERM GOAL #4   Title  Patient  will complete >1000 feet on 6 min walk test without increase in foot pain to improve community ambulator distance for functional tasks and return to PLOF.     Time  8    Period  Weeks    Status  New    Target Date  10/09/18            Plan - 09/25/18 0817    Clinical Impression Statement  Patient presents to clinic with decreased pain and is amenable to therapy. Patient had no increased pain with ankle and foot exercises performed, and was encouraged to gradually incorporate more strengthening activties into her HEP. Patient will continue to benefit from skilled therapeutic intervention to address deficits in strength, gait, and mobility for improved QOL.    Rehab Potential  Good    Clinical Impairments Affecting Rehab Potential  motivated, has good caregiver support    PT Frequency  2x / week    PT Duration  8 weeks    PT Treatment/Interventions  Cryotherapy;Iontophoresis 4mg /ml Dexamethasone;Moist Heat;Ultrasound;Gait training;Stair training;Functional mobility training;Therapeutic activities;Therapeutic exercise;Balance training;Neuromuscular re-education;Patient/family education;Orthotic Fit/Training;Manual techniques;Passive range of motion;Dry needling;Energy conservation;Splinting;Taping    PT Next Visit Plan  advance HEP, manual, ionto/taping    PT Home Exercise Plan  initiated, see patient instructions    Consulted and Agree with Plan of Care  Patient       Patient will benefit from skilled therapeutic intervention in order to improve the following deficits and impairments:  Abnormal gait, Decreased activity tolerance, Pain, Difficulty walking, Decreased mobility, Decreased balance, Decreased range of motion, Impaired flexibility  Visit Diagnosis: Pain in right foot  Pain in left foot  Difficulty in walking, not elsewhere classified  Unsteadiness on feet  Muscle weakness (generalized)     Problem List Patient Active Problem List   Diagnosis Date Noted  . Left  foot pain 07/17/2018  . Tinnitus, bilateral 11/28/2017  . Abnormal MRI of head 11/14/2017  . Elevated erythrocyte sedimentation rate 11/14/2017  . Right temporal headache 11/14/2017  . Brain mass 09/21/2017  . Ataxia 05/11/2017  . Dizziness 04/06/2017  . PAF (paroxysmal atrial fibrillation) ([place]) 02/03/2017  . DOE (dyspnea on exertion) 12/01/2016  . Acute right-sided low back pain without sciatica 08/26/2016  . Preventative health care 06/02/2016  . Advance directive discussed with patient 06/02/2016  . Achilles tendonitis 03/11/2016  . Hypertension   . DVT, lower extremity, recurrent ([place])   . Type 2 diabetes, controlled, with peripheral neuropathy ([place])   . Generalized osteoarthritis of multiple sites   . GERD (gastroesophageal reflux disease)   . Hyperthyroidism   . Retinal hole of right eye 06/26/2014  . Amblyopia, right 05/08/2014  . Status post intraocular lens implant 05/08/2014   Myles Gip PT, DPT [phone] 09/25/2018, 8:21 AM  [place] MAIN [facility] SERVICES [address] [place], [place], [address] Phone: [phone]   Fax:  [phone]  Name: Angela Bentley MRN: [id number] Date of Birth: 17-Oct-1940

## 2018-09-27 ENCOUNTER — Encounter: Payer: Self-pay | Admitting: Physical Therapy

## 2018-09-27 ENCOUNTER — Ambulatory Visit: Payer: Medicare Other | Attending: Internal Medicine | Admitting: Physical Therapy

## 2018-09-27 DIAGNOSIS — M79672 Pain in left foot: Secondary | ICD-10-CM

## 2018-09-27 DIAGNOSIS — R2681 Unsteadiness on feet: Secondary | ICD-10-CM | POA: Diagnosis not present

## 2018-09-27 DIAGNOSIS — M79671 Pain in right foot: Secondary | ICD-10-CM | POA: Diagnosis not present

## 2018-09-27 DIAGNOSIS — M6281 Muscle weakness (generalized): Secondary | ICD-10-CM | POA: Insufficient documentation

## 2018-09-27 DIAGNOSIS — R262 Difficulty in walking, not elsewhere classified: Secondary | ICD-10-CM | POA: Diagnosis not present

## 2018-09-27 NOTE — Therapy (Signed)
[place] MAIN [facility] SERVICES [address] [place], [place], [address] Phone: [phone]   Fax:  [phone]  Physical Therapy Treatment Physical Therapy Progress Note   Dates of reporting period  08/14/18   to   10/15/18  Patient Details  Name: Angela Bentley MRN: [id number] Date of Birth: [birth year] Referring Provider (PT): Dr. [name]   Encounter Date: 09/27/2018  PT End of Session - 09/27/18 1042    Visit Number  10    Number of Visits  21    Date for PT Re-Evaluation  10/25/18    Authorization Type  10/10, Start of care 08/14/18    PT Start Time  0932    PT Stop Time  1018    PT Time Calculation (min)  46 min    Activity Tolerance  Patient tolerated treatment well;No increased pain    Behavior During Therapy  WFL for tasks assessed/performed       Past Medical History:  Diagnosis Date  . Brain tumor ([place])    a. Left intraventricular tumor ->stable by 06/2017 MRI. Followed @ [facility].  . Gait disturbance    a. Unsteady on feet/balance difficulty.  . Generalized osteoarthritis of multiple sites   . GERD (gastroesophageal reflux disease)   . History of DVT (deep vein thrombosis) 2016   estrogen and airflights. Rx with xarelto for 3 months  . History of stress test    a. 11/2016 ETT: No ST/T changes.  Performed to assess PAC/PVC burden with activity ->No ectopy noted.  . Hypertension   . Hyperthyroidism    treated briefly in college  . Insomnia   . Obesity   . PAF (paroxysmal atrial fibrillation) ([place])    a. 12/2016 Event Monitor: brief run of PAF-->CHA2DS2VASc = 5--> Xarelto.  Marland Kitchen Retinal tear of left eye   . Rosacea   . Type 2 diabetes, controlled, with peripheral neuropathy ([place])    a. 11/2016 A1c 7.4.  . Uterine fibroid     Past Surgical History:  Procedure Laterality Date  . CARPAL TUNNEL RELEASE Right    Dr Burney Gauze  . CATARACT EXTRACTION    . RETINAL TEAR REPAIR CRYOTHERAPY     10/09/13, then again 3/15    There  were no vitals filed for this visit.  Subjective Assessment - 09/27/18 0936    Subjective  Patient reports that she is not using the inserts and has switched back to her brooks shoes; she is having some soreness still in right heel but reports she is using more ice and states that it is helping; She has also been doing more strengthening but isn't sure if she is doing it correctly; She reports still having pain in left foot especially if she has been doing a lot of standing/walking;     Pertinent History  78 yo Female reports history of bilateral foot plantar fasciitis since August 2019 which has been bothering her since; She went to see Dr. Milinda Pointer who did injection in bilateral feet end of September 2019. She was then given soft ankle brace to wear. she reports not really getting relief in right foot. A week after wearing the brace she started having severe pain in left foot along tarsal joint (anterior foot), she stopped wearing brace. she went to see Dr. [name] who recommended she follow up with Dr. Milinda Pointer who did do x-ray which was negative for fracture. She reports still having bilateral foot pain and intermittent swelling; She is walking without assistive device  but her walk is antalgic with decreased heel strike;     How long can you sit comfortably?  NA    How long can you stand comfortably?  has pain in feet with initial standing, and will continue to hurt with longer period of standing;     How long can you walk comfortably?  hurts with most walking; has limited walking; not walking the dog    Diagnostic tests  X-ray negative for fracture;     Patient Stated Goals  improve walking and reduce foot pain;     Currently in Pain?  Yes    Pain Score  3     Pain Location  Heel    Pain Orientation  Right    Pain Descriptors / Indicators  Aching;Burning    Pain Type  Chronic pain    Pain Onset  More than a month ago    Pain Frequency  Intermittent    Aggravating Factors   worse in the morning and  with initial standing    Pain Relieving Factors  rest/ice    Effect of Pain on Daily Activities  decreased activity tolerance;     Multiple Pain Sites  No    Pain Onset  1 to 4 weeks ago         [facility] PT Assessment - 09/27/18 0001      AROM   Right Ankle Dorsiflexion  4    Right Ankle Plantar Flexion  70    Right Ankle Inversion  25    Right Ankle Eversion  23    Left Ankle Dorsiflexion  5    Left Ankle Plantar Flexion  70    Left Ankle Inversion  30    Left Ankle Eversion  15      Standardized Balance Assessment   10 Meter Walk  1.03 m/s without AD, community ambulator, improved from 0.86 m/s on 08/21/18         TREATMENT: PT assessed patient goals, looking at ankle ROM and gait speed, see above; Overall she exhibits improvement in ankle DF however is still slightly limited in ankle DF ROM.  PT identified continued tenderness along right heel, inferiorly today. PT performed ice massage x15 min to improve tissue extensibility and reduce inflammation/discomfort. Patient tolerated ice massage well reporting no tenderness after treatment.  PT applied iontophoresis, with dexamethasone, 2m/mL to right heel with instruction on safe wear time. Pt has been tolerating iontophoresis well with less pain reported after treatment. Upon standing, patient denies any pain in right heel stating, "It feels the best it has felt in the last few days."  PT instructed patient in ankle strengthening: Long sitting: Ankle DF x15 reps green tband, patient will often flex knee to reduce strain on calf with ankle DF; required min VCs for better positioning; Ankle EV x15 reps green tband bilaterally with cues to avoid hip rotation for better ankle strengthening;  Instructed patient in BAPs board: L3, LLE only ankle DF/PF, IV/EV x10 reps each with cues for positioning and exercise technique; Patient did have difficulty requiring increased time to isolate intrinsic foot muscles for better ankle  control;                    PT Education - 09/27/18 1041    Education Details  exercise technique, strengthening, Plan of care/progress towards goals    Person(s) Educated  Patient    Methods  Explanation;Demonstration;Verbal cues    Comprehension  Verbalized understanding;Returned demonstration;Verbal cues  required;Need further instruction       PT Short Term Goals - 09/27/18 0940      PT SHORT TERM GOAL #1   Title  Patient will be adherent to HEP at least 3x a week to improve functional strength and balance for better safety at home.    Time  4    Period  Weeks    Status  Achieved    Target Date  10/25/18      PT SHORT TERM GOAL #2   Title  Patient will report worse pain in right foot of 4/10 over last few days to exhibit improved tolerance with ADLs;     Baseline  1/2: 4-5/10    Time  4    Period  Weeks    Status  Partially Met      PT SHORT TERM GOAL #3   Title  Patient will report worst pain in left foot of 4/10 in last few days to exhibit improved tolerance with ADLs;     Baseline  1/2: 2/10    Time  4    Period  Weeks    Status  Achieved        PT Long Term Goals - 09/27/18 0941      PT LONG TERM GOAL #1   Title  Patient will increase 10 meter walk test to >1.45ms without antalgic gait, as to improve gait speed for better community ambulation and to reduce fall risk.    Time  8    Period  Weeks    Status  Achieved    Target Date  10/25/18      PT LONG TERM GOAL #2   Title  Patient will be able to sit for 30 min and report <2/10 foot pain upon standing to exhibit improved tolerance with sitting for meals and other ADL tasks.     Time  4    Period  Weeks    Status  Partially Met    Target Date  10/25/18      PT LONG TERM GOAL #3   Title  Patient will improve ankle AROM DF >5 degrees bilaterally to improve functional ROM for ADLs such as negotiating stairs.     Baseline  1/2: R: 4 degrees, L: 5 degrees    Time  4    Period  Weeks     Status  Partially Met    Target Date  10/25/18      PT LONG TERM GOAL #4   Title  Patient will complete >1000 feet on 6 min walk test without increase in foot pain to improve community ambulator distance for functional tasks and return to PLOF.     Time  4    Period  Weeks    Status  Partially Met    Target Date  10/25/18            Plan - 09/27/18 1043    Clinical Impression Statement  Patient presents to clinic with decreased pain in left foot but is still having right heel pain. She reports her heel pain is still bothersome first thing in the morning with initial weight bearing. PT performed ice massage and instructed patient in ankle LE ankle strengthening exercise. Initiated BAPs board exercise on LLE to improve ankle control. Patient does require mod VCs for positioning and correct exercise technique as she often compensates with hip/knee movement; PT assessed goals with progress made towards all goals noted. She is responding well to iontohporesis  with less pain reported in heel following treatment. Patient would benefit from additional skilled PT intervention to improve strength and flexibility and reduce pain with ADLs.   Patient's condition has the potential to improve in response to therapy. Maximum improvement is yet to be obtained. The anticipated improvement is attainable and reasonable in a generally predictable time.  Patient reports some improvement, but is still having pain in the morning with initial standing.     Rehab Potential  Good    Clinical Impairments Affecting Rehab Potential  motivated, has good caregiver support    PT Frequency  2x / week    PT Duration  4 weeks    PT Treatment/Interventions  Cryotherapy;Iontophoresis 49m/ml Dexamethasone;Moist Heat;Ultrasound;Gait training;Stair training;Functional mobility training;Therapeutic activities;Therapeutic exercise;Balance training;Neuromuscular re-education;Patient/family education;Orthotic Fit/Training;Manual  techniques;Passive range of motion;Dry needling;Energy conservation;Splinting;Taping    PT Next Visit Plan  advance HEP, manual, ionto/taping    PT Home Exercise Plan  initiated, see patient instructions    Consulted and Agree with Plan of Care  Patient       Patient will benefit from skilled therapeutic intervention in order to improve the following deficits and impairments:  Abnormal gait, Decreased activity tolerance, Pain, Difficulty walking, Decreased mobility, Decreased balance, Decreased range of motion, Impaired flexibility  Visit Diagnosis: Pain in right foot - Plan: PT plan of care cert/re-cert  Pain in left foot - Plan: PT plan of care cert/re-cert  Difficulty in walking, not elsewhere classified - Plan: PT plan of care cert/re-cert  Unsteadiness on feet - Plan: PT plan of care cert/re-cert  Muscle weakness (generalized) - Plan: PT plan of care cert/re-cert     Problem List Patient Active Problem List   Diagnosis Date Noted  . Left foot pain 07/17/2018  . Tinnitus, bilateral 11/28/2017  . Abnormal MRI of head 11/14/2017  . Elevated erythrocyte sedimentation rate 11/14/2017  . Right temporal headache 11/14/2017  . Brain mass 09/21/2017  . Ataxia 05/11/2017  . Dizziness 04/06/2017  . PAF (paroxysmal atrial fibrillation) ([place] 02/03/2017  . DOE (dyspnea on exertion) 12/01/2016  . Acute right-sided low back pain without sciatica 08/26/2016  . Preventative health care 06/02/2016  . Advance directive discussed with patient 06/02/2016  . Achilles tendonitis 03/11/2016  . Hypertension   . DVT, lower extremity, recurrent ([place]   . Type 2 diabetes, controlled, with peripheral neuropathy ([place]   . Generalized osteoarthritis of multiple sites   . GERD (gastroesophageal reflux disease)   . Hyperthyroidism   . Retinal hole of right eye 06/26/2014  . Amblyopia, right 05/08/2014  . Status post intraocular lens implant 05/08/2014    Karin Pinedo,MargaretPT, DPT 09/27/2018,  10:53 AM  [place] [facility] [address] [address]Phone: [phone]  Fax:  [phone] Name: CTonae LivolsiMRN: [id number] of Birth: 1October 23, 1942

## 2018-10-01 ENCOUNTER — Encounter: Payer: Self-pay | Admitting: Physical Therapy

## 2018-10-01 ENCOUNTER — Ambulatory Visit: Payer: Medicare Other | Admitting: Physical Therapy

## 2018-10-01 DIAGNOSIS — M79671 Pain in right foot: Secondary | ICD-10-CM | POA: Diagnosis not present

## 2018-10-01 DIAGNOSIS — M79672 Pain in left foot: Secondary | ICD-10-CM

## 2018-10-01 DIAGNOSIS — R2681 Unsteadiness on feet: Secondary | ICD-10-CM

## 2018-10-01 DIAGNOSIS — M6281 Muscle weakness (generalized): Secondary | ICD-10-CM | POA: Diagnosis not present

## 2018-10-01 DIAGNOSIS — R262 Difficulty in walking, not elsewhere classified: Secondary | ICD-10-CM

## 2018-10-01 NOTE — Therapy (Signed)
[place] MAIN [facility] SERVICES [address] [place], [place], [address] Phone: [phone]   Fax:  [phone]  Physical Therapy Treatment  Patient Details  Name: Angela Bentley MRN: [id number] Date of Birth: Dec 25, 1940 Referring Provider (PT): Dr. [name]   Encounter Date: 10/01/2018  PT End of Session - 10/02/18 1620    Visit Number  11    Number of Visits  21    Date for PT Re-Evaluation  10/25/18    Authorization Type  1/10 Start of care 08/14/18    PT Start Time  1030    PT Stop Time  1112    PT Time Calculation (min)  42 min    Activity Tolerance  Patient tolerated treatment well;No increased pain    Behavior During Therapy  WFL for tasks assessed/performed       Past Medical History:  Diagnosis Date  . Brain tumor ([place])    a. Left intraventricular tumor ->stable by 06/2017 MRI. Followed @ [facility].  . Gait disturbance    a. Unsteady on feet/balance difficulty.  . Generalized osteoarthritis of multiple sites   . GERD (gastroesophageal reflux disease)   . History of DVT (deep vein thrombosis) 2016   estrogen and airflights. Rx with xarelto for 3 months  . History of stress test    a. 11/2016 ETT: No ST/T changes.  Performed to assess PAC/PVC burden with activity ->No ectopy noted.  . Hypertension   . Hyperthyroidism    treated briefly in college  . Insomnia   . Obesity   . PAF (paroxysmal atrial fibrillation) ([place])    a. 12/2016 Event Monitor: brief run of PAF-->CHA2DS2VASc = 5--> Xarelto.  Marland Kitchen Retinal tear of left eye   . Rosacea   . Type 2 diabetes, controlled, with peripheral neuropathy ([place])    a. 11/2016 A1c 7.4.  . Uterine fibroid     Past Surgical History:  Procedure Laterality Date  . CARPAL TUNNEL RELEASE Right    Dr Burney Gauze  . CATARACT EXTRACTION    . RETINAL TEAR REPAIR CRYOTHERAPY     10/09/13, then again 3/15    There were no vitals filed for this visit.  Subjective Assessment - 10/01/18 1040    Subjective   Patient reports feeling significantly better since last session. She reports yesterday having no pain and woke up feeling so much better.     Pertinent History  78 yo Female reports history of bilateral foot plantar fasciitis since August 2019 which has been bothering her since; She went to see Dr. Milinda Pointer who did injection in bilateral feet end of September 2019. She was then given soft ankle brace to wear. she reports not really getting relief in right foot. A week after wearing the brace she started having severe pain in left foot along tarsal joint (anterior foot), she stopped wearing brace. she went to see Dr. [name] who recommended she follow up with Dr. Milinda Pointer who did do x-ray which was negative for fracture. She reports still having bilateral foot pain and intermittent swelling; She is walking without assistive device but her walk is antalgic with decreased heel strike;     How long can you sit comfortably?  NA    How long can you stand comfortably?  has pain in feet with initial standing, and will continue to hurt with longer period of standing;     How long can you walk comfortably?  hurts with most walking; has limited walking; not walking the dog  Diagnostic tests  X-ray negative for fracture;     Patient Stated Goals  improve walking and reduce foot pain;     Currently in Pain?  Yes    Pain Score  1     Pain Location  Heel    Pain Orientation  Right    Pain Descriptors / Indicators  Aching;Burning    Pain Type  Chronic pain    Pain Onset  More than a month ago    Pain Frequency  Intermittent    Aggravating Factors   worse    Pain Onset  1 to 4 weeks ago             TREATMENT: PT applied iontophoresis, with dexamethasone, 8m/mL to right heel with instruction on safe wear time. Pt has been tolerating iontophoresis well with less pain reported after treatment. Upon standing, patient denies any pain in right heel stating, "It feels the best it has felt in the last few  days."   Instructed patient in BAPs board: L3, LLE and RLE ankle DF/PF, IV/EV, circles clockwise/counterclockwise x10 reps each with cues for positioning and exercise technique; Patient did have difficulty requiring increased time to isolate intrinsic foot muscles for better ankle control;  Towel scrunches x10 reps each LE with mod VCs for how to increase scrunch and isolate toe motion rather than ankle PF  Reinforced HEP                    PT Education - 10/02/18 1620    Education Details  exercise technique, strengthening/HEP    Person(s) Educated  Patient    Methods  Explanation;Verbal cues    Comprehension  Verbalized understanding;Returned demonstration;Verbal cues required;Need further instruction       PT Short Term Goals - 09/27/18 0940      PT SHORT TERM GOAL #1   Title  Patient will be adherent to HEP at least 3x a week to improve functional strength and balance for better safety at home.    Time  4    Period  Weeks    Status  Achieved    Target Date  10/25/18      PT SHORT TERM GOAL #2   Title  Patient will report worse pain in right foot of 4/10 over last few days to exhibit improved tolerance with ADLs;     Baseline  1/2: 4-5/10    Time  4    Period  Weeks    Status  Partially Met      PT SHORT TERM GOAL #3   Title  Patient will report worst pain in left foot of 4/10 in last few days to exhibit improved tolerance with ADLs;     Baseline  1/2: 2/10    Time  4    Period  Weeks    Status  Achieved        PT Long Term Goals - 09/27/18 0941      PT LONG TERM GOAL #1   Title  Patient will increase 10 meter walk test to >1.015m without antalgic gait, as to improve gait speed for better community ambulation and to reduce fall risk.    Time  8    Period  Weeks    Status  Achieved    Target Date  10/25/18      PT LONG TERM GOAL #2   Title  Patient will be able to sit for 30 min and report <2/10 foot pain upon standing  to exhibit improved  tolerance with sitting for meals and other ADL tasks.     Time  4    Period  Weeks    Status  Partially Met    Target Date  10/25/18      PT LONG TERM GOAL #3   Title  Patient will improve ankle AROM DF >5 degrees bilaterally to improve functional ROM for ADLs such as negotiating stairs.     Baseline  1/2: R: 4 degrees, L: 5 degrees    Time  4    Period  Weeks    Status  Partially Met    Target Date  10/25/18      PT LONG TERM GOAL #4   Title  Patient will complete >1000 feet on 6 min walk test without increase in foot pain to improve community ambulator distance for functional tasks and return to PLOF.     Time  4    Period  Weeks    Status  Partially Met    Target Date  10/25/18            Plan - 10/02/18 1621    Clinical Impression Statement  Patient is progressing well. She reports significant reduction in pain overall. She is still having right heel pain but that has lessened. PT applied iontophoresis to help reduce inflammation in right heel. Patient instructed in advanced intrinsic foot strengthening to improve foot/ankle control and reduce discomfort. She does require min VCs for correct exercise technique. patient would benefit from additional skilled PT Intervention to improve strength, balance and gait safety; plan to progress to balance exercise as foot pain decreases.    Rehab Potential  Good    Clinical Impairments Affecting Rehab Potential  motivated, has good caregiver support    PT Frequency  2x / week    PT Duration  4 weeks    PT Treatment/Interventions  Cryotherapy;Iontophoresis 26m/ml Dexamethasone;Moist Heat;Ultrasound;Gait training;Stair training;Functional mobility training;Therapeutic activities;Therapeutic exercise;Balance training;Neuromuscular re-education;Patient/family education;Orthotic Fit/Training;Manual techniques;Passive range of motion;Dry needling;Energy conservation;Splinting;Taping    PT Next Visit Plan  advance HEP, manual, ionto/taping     PT Home Exercise Plan  initiated, see patient instructions    Consulted and Agree with Plan of Care  Patient       Patient will benefit from skilled therapeutic intervention in order to improve the following deficits and impairments:  Abnormal gait, Decreased activity tolerance, Pain, Difficulty walking, Decreased mobility, Decreased balance, Decreased range of motion, Impaired flexibility  Visit Diagnosis: Pain in right foot  Pain in left foot  Difficulty in walking, not elsewhere classified  Unsteadiness on feet  Muscle weakness (generalized)     Problem List Patient Active Problem List   Diagnosis Date Noted  . Left foot pain 07/17/2018  . Tinnitus, bilateral 11/28/2017  . Abnormal MRI of head 11/14/2017  . Elevated erythrocyte sedimentation rate 11/14/2017  . Right temporal headache 11/14/2017  . Brain mass 09/21/2017  . Ataxia 05/11/2017  . Dizziness 04/06/2017  . PAF (paroxysmal atrial fibrillation) ([place] 02/03/2017  . DOE (dyspnea on exertion) 12/01/2016  . Acute right-sided low back pain without sciatica 08/26/2016  . Preventative health care 06/02/2016  . Advance directive discussed with patient 06/02/2016  . Achilles tendonitis 03/11/2016  . Hypertension   . DVT, lower extremity, recurrent ([place]   . Type 2 diabetes, controlled, with peripheral neuropathy ([place]   . Generalized osteoarthritis of multiple sites   . GERD (gastroesophageal reflux disease)   . Hyperthyroidism   . Retinal hole  of right eye 06/26/2014  . Amblyopia, right 05/08/2014  . Status post intraocular lens implant 05/08/2014    Rahil Passey PT,DPT 10/02/2018, 4:23 PM  [place] MAIN [facility] SERVICES [address] [place], [place], [address] Phone: [phone]   Fax:  [phone]  Name: Liandra Mendia MRN: [id number] Date of Birth: [birth year]

## 2018-10-03 ENCOUNTER — Encounter: Payer: Self-pay | Admitting: Physical Therapy

## 2018-10-03 ENCOUNTER — Encounter: Payer: Self-pay | Admitting: Internal Medicine

## 2018-10-03 ENCOUNTER — Ambulatory Visit (INDEPENDENT_AMBULATORY_CARE_PROVIDER_SITE_OTHER): Payer: Medicare Other | Admitting: Internal Medicine

## 2018-10-03 ENCOUNTER — Ambulatory Visit: Payer: Medicare Other | Admitting: Physical Therapy

## 2018-10-03 VITALS — BP 140/84 | HR 75 | Temp 97.9°F | Ht 66.0 in | Wt 209.0 lb

## 2018-10-03 DIAGNOSIS — I480 Paroxysmal atrial fibrillation: Secondary | ICD-10-CM | POA: Diagnosis not present

## 2018-10-03 DIAGNOSIS — I82409 Acute embolism and thrombosis of unspecified deep veins of unspecified lower extremity: Secondary | ICD-10-CM | POA: Diagnosis not present

## 2018-10-03 DIAGNOSIS — Z0000 Encounter for general adult medical examination without abnormal findings: Secondary | ICD-10-CM | POA: Diagnosis not present

## 2018-10-03 DIAGNOSIS — R2681 Unsteadiness on feet: Secondary | ICD-10-CM | POA: Diagnosis not present

## 2018-10-03 DIAGNOSIS — R262 Difficulty in walking, not elsewhere classified: Secondary | ICD-10-CM | POA: Diagnosis not present

## 2018-10-03 DIAGNOSIS — M79672 Pain in left foot: Secondary | ICD-10-CM

## 2018-10-03 DIAGNOSIS — M6281 Muscle weakness (generalized): Secondary | ICD-10-CM | POA: Diagnosis not present

## 2018-10-03 DIAGNOSIS — M79671 Pain in right foot: Secondary | ICD-10-CM | POA: Diagnosis not present

## 2018-10-03 DIAGNOSIS — Z7189 Other specified counseling: Secondary | ICD-10-CM

## 2018-10-03 DIAGNOSIS — D329 Benign neoplasm of meninges, unspecified: Secondary | ICD-10-CM | POA: Diagnosis not present

## 2018-10-03 DIAGNOSIS — E1142 Type 2 diabetes mellitus with diabetic polyneuropathy: Secondary | ICD-10-CM | POA: Diagnosis not present

## 2018-10-03 LAB — LIPID PANEL
Cholesterol: 172 mg/dL (ref 0–200)
HDL: 48.9 mg/dL (ref >39.00)
LDL Cholesterol: 98 mg/dL (ref 0–99)
NonHDL: 122.88
Total CHOL/HDL Ratio: 4
Triglycerides: 122 mg/dL (ref 0.0–149.0)
VLDL: 24.4 mg/dL (ref 0.0–40.0)

## 2018-10-03 LAB — COMPREHENSIVE METABOLIC PANEL
ALT: 17 U/L (ref 0–35)
AST: 19 U/L (ref 0–37)
Albumin: 4.3 g/dL (ref 3.5–5.2)
Alkaline Phosphatase: 60 U/L (ref 39–117)
BUN: 14 mg/dL (ref 6–23)
CO2: 32 mEq/L (ref 19–32)
Calcium: 9.7 mg/dL (ref 8.4–10.5)
Chloride: 99 mEq/L (ref 96–112)
Creatinine, Ser: 0.84 mg/dL (ref 0.40–1.20)
GFR: 69.49 mL/min (ref >60.00)
Glucose, Bld: 152 mg/dL — ABNORMAL HIGH (ref 70–99)
Potassium: 4 mEq/L (ref 3.5–5.1)
Sodium: 137 mEq/L (ref 135–145)
Total Bilirubin: 0.5 mg/dL (ref 0.2–1.2)
Total Protein: 7 g/dL (ref 6.0–8.3)

## 2018-10-03 LAB — CBC
HCT: 38.2 % (ref 36.0–46.0)
Hemoglobin: 12.8 g/dL (ref 12.0–15.0)
MCHC: 33.4 g/dL (ref 30.0–36.0)
MCV: 88.5 fl (ref 78.0–100.0)
Platelets: 226 10*3/uL (ref 150.0–400.0)
RBC: 4.31 Mil/uL (ref 3.87–5.11)
RDW: 13.5 % (ref 11.5–15.5)
WBC: 7.5 10*3/uL (ref 4.0–10.5)

## 2018-10-03 LAB — HM DIABETES FOOT EXAM

## 2018-10-03 LAB — HEMOGLOBIN A1C: Hgb A1c MFr Bld: 7.4 % — ABNORMAL HIGH (ref 4.6–6.5)

## 2018-10-03 NOTE — Therapy (Signed)
[place] MAIN [facility] SERVICES [address] [place], [place], [address] Phone: [phone]   Fax:  [phone]  Physical Therapy Treatment  Patient Details  Name: Angela Bentley MRN: [id number] Date of Birth: 1940/10/04 Referring Provider (PT): Dr. [name]   Encounter Date: 10/03/2018  PT End of Session - 10/03/18 1027    Visit Number  12    Number of Visits  21    Date for PT Re-Evaluation  10/25/18    Authorization Type  2/10 Start of care 08/14/18    PT Start Time  1016    PT Stop Time  1100    PT Time Calculation (min)  44 min    Activity Tolerance  Patient tolerated treatment well;No increased pain    Behavior During Therapy  WFL for tasks assessed/performed       Past Medical History:  Diagnosis Date  . Brain tumor ([place])    a. Left intraventricular tumor ->stable by 06/2017 MRI. Followed @ [facility].  . Gait disturbance    a. Unsteady on feet/balance difficulty.  . Generalized osteoarthritis of multiple sites   . GERD (gastroesophageal reflux disease)   . History of DVT (deep vein thrombosis) 2016   estrogen and airflights. Rx with xarelto for 3 months  . History of stress test    a. 11/2016 ETT: No ST/T changes.  Performed to assess PAC/PVC burden with activity ->No ectopy noted.  . Hypertension   . Hyperthyroidism    treated briefly in college  . Insomnia   . Obesity   . PAF (paroxysmal atrial fibrillation) ([place])    a. 12/2016 Event Monitor: brief run of PAF-->CHA2DS2VASc = 5--> Xarelto.  Marland Kitchen Retinal tear of left eye   . Rosacea   . Type 2 diabetes, controlled, with peripheral neuropathy ([place])    a. 11/2016 A1c 7.4.  . Uterine fibroid     Past Surgical History:  Procedure Laterality Date  . CARPAL TUNNEL RELEASE Right    Dr Burney Gauze  . CATARACT EXTRACTION    . RETINAL TEAR REPAIR CRYOTHERAPY     10/09/13, then again 3/15    There were no vitals filed for this visit.  Subjective Assessment - 10/03/18 1022    Subjective   Pt reports no pain currently; She reports increased heel pain yesterday; she Reports intermittent balance with mixed feeling of stability; denies any new falls; reports having a good appointment with Dr. [name]    Pertinent History  78 yo Female reports history of bilateral foot plantar fasciitis since August 2019 which has been bothering her since; She went to see Dr. Milinda Pointer who did injection in bilateral feet end of September 2019. She was then given soft ankle brace to wear. she reports not really getting relief in right foot. A week after wearing the brace she started having severe pain in left foot along tarsal joint (anterior foot), she stopped wearing brace. she went to see Dr. [name] who recommended she follow up with Dr. Milinda Pointer who did do x-ray which was negative for fracture. She reports still having bilateral foot pain and intermittent swelling; She is walking without assistive device but her walk is antalgic with decreased heel strike;     How long can you sit comfortably?  NA    How long can you stand comfortably?  has pain in feet with initial standing, and will continue to hurt with longer period of standing;     How long can you walk comfortably?  hurts  with most walking; has limited walking; not walking the dog    Diagnostic tests  X-ray negative for fracture;     Patient Stated Goals  improve walking and reduce foot pain;     Currently in Pain?  No/denies    Pain Onset  More than a month ago    Multiple Pain Sites  No    Pain Onset  1 to 4 weeks ago            TREATMENT: PT applied iontophoresis, with dexamethasone, 82m/mL to right heel with instruction on safe wear time. Pt has been tolerating iontophoresis well with less pain reported after treatment. Upon standing, patient denies any pain in right heel stating, "It feels the best it has felt in the last few days."   Instructed patient in BAPs board: L3, LLE and RLE ankle DF/PF, IV/EV, circles clockwise/counterclockwise x10  reps each with cues for positioning and exercise technique; Patient did have difficulty requiring increased time to isolate intrinsic foot muscles for better ankle control;   Standing on 1/2 bolster: (round side up): -heel raises x15 reps with cues to keep knees slightly bent for less knee discomfort; -tandem stance, unsupported 10 sec hold x2 reps each foot in front to challenge ankle control, CGA for safety;  Standing on 1/2 bolster (flat side up) -feet apart, head turns side/side, up/down x10 reps each, unsupported with min A for safety;  Following session, patient reports no pain in foot and states, "I feel a lot better."                    PT Education - 10/03/18 1027    Education Details  exercise technique/strengthening, HEP reinforced    Person(s) Educated  Patient    Methods  Explanation;Verbal cues    Comprehension  Verbalized understanding;Returned demonstration;Verbal cues required;Need further instruction       PT Short Term Goals - 09/27/18 0940      PT SHORT TERM GOAL #1   Title  Patient will be adherent to HEP at least 3x a week to improve functional strength and balance for better safety at home.    Time  4    Period  Weeks    Status  Achieved    Target Date  10/25/18      PT SHORT TERM GOAL #2   Title  Patient will report worse pain in right foot of 4/10 over last few days to exhibit improved tolerance with ADLs;     Baseline  1/2: 4-5/10    Time  4    Period  Weeks    Status  Partially Met      PT SHORT TERM GOAL #3   Title  Patient will report worst pain in left foot of 4/10 in last few days to exhibit improved tolerance with ADLs;     Baseline  1/2: 2/10    Time  4    Period  Weeks    Status  Achieved        PT Long Term Goals - 09/27/18 0941      PT LONG TERM GOAL #1   Title  Patient will increase 10 meter walk test to >1.033m without antalgic gait, as to improve gait speed for better community ambulation and to reduce fall  risk.    Time  8    Period  Weeks    Status  Achieved    Target Date  10/25/18      PT  LONG TERM GOAL #2   Title  Patient will be able to sit for 30 min and report <2/10 foot pain upon standing to exhibit improved tolerance with sitting for meals and other ADL tasks.     Time  4    Period  Weeks    Status  Partially Met    Target Date  10/25/18      PT LONG TERM GOAL #3   Title  Patient will improve ankle AROM DF >5 degrees bilaterally to improve functional ROM for ADLs such as negotiating stairs.     Baseline  1/2: R: 4 degrees, L: 5 degrees    Time  4    Period  Weeks    Status  Partially Met    Target Date  10/25/18      PT LONG TERM GOAL #4   Title  Patient will complete >1000 feet on 6 min walk test without increase in foot pain to improve community ambulator distance for functional tasks and return to PLOF.     Time  4    Period  Weeks    Status  Partially Met    Target Date  10/25/18            Plan - 10/03/18 1206    Clinical Impression Statement  Patient is progressing well. She reports overall a reduction in heel pain. Reinforced HEP recommedations and proper footwear. Patient did report slight burning after ionto treatment last session but reports it was minimal and then didn't bother her once she removed the patch. Instructed patient in advanced LE foot/ankle strengtheing. Utilized compliant surfaces to challenge intrinsic muscles of foot. Patient reports no pain at end of session. She would benefit from additional skilled PT intervention to improve strength, balance and gait safety;     Rehab Potential  Good    Clinical Impairments Affecting Rehab Potential  motivated, has good caregiver support    PT Frequency  2x / week    PT Duration  4 weeks    PT Treatment/Interventions  Cryotherapy;Iontophoresis 79m/ml Dexamethasone;Moist Heat;Ultrasound;Gait training;Stair training;Functional mobility training;Therapeutic activities;Therapeutic exercise;Balance  training;Neuromuscular re-education;Patient/family education;Orthotic Fit/Training;Manual techniques;Passive range of motion;Dry needling;Energy conservation;Splinting;Taping    PT Next Visit Plan  advance HEP, manual, ionto/taping    PT Home Exercise Plan  initiated, see patient instructions    Consulted and Agree with Plan of Care  Patient       Patient will benefit from skilled therapeutic intervention in order to improve the following deficits and impairments:  Abnormal gait, Decreased activity tolerance, Pain, Difficulty walking, Decreased mobility, Decreased balance, Decreased range of motion, Impaired flexibility  Visit Diagnosis: Pain in right foot  Pain in left foot  Difficulty in walking, not elsewhere classified  Unsteadiness on feet  Muscle weakness (generalized)     Problem List Patient Active Problem List   Diagnosis Date Noted  . Tinnitus, bilateral 11/28/2017  . Abnormal MRI of head 11/14/2017  . Meningioma ([place] 09/21/2017  . Ataxia 05/11/2017  . PAF (paroxysmal atrial fibrillation) ([place] 02/03/2017  . Preventative health care 06/02/2016  . Advance directive discussed with patient 06/02/2016  . Achilles tendonitis 03/11/2016  . Hypertension   . DVT, lower extremity, recurrent ([place]   . Type 2 diabetes, controlled, with peripheral neuropathy ([place]   . Generalized osteoarthritis of multiple sites   . GERD (gastroesophageal reflux disease)   . Hyperthyroidism   . Retinal hole of right eye 06/26/2014  . Amblyopia, right 05/08/2014  . Status post intraocular  lens implant 05/08/2014    , PT, DPT 10/03/2018, 12:11 PM  [place] MAIN [facility] SERVICES [address] [place], [place], [address] Phone: [phone]   Fax:  [phone]  Name: Meya Clutter MRN: [id number] Date of Birth: 1941/08/05

## 2018-10-03 NOTE — Progress Notes (Signed)
Hearing Screening   Method: Audiometry   125Hz  250Hz  500Hz  1000Hz  2000Hz  3000Hz  4000Hz  6000Hz  8000Hz   Right ear:   40 40 20  25    Left ear:   20 25 20   0    Vision Screening Comments: May 2019

## 2018-10-03 NOTE — Progress Notes (Signed)
Subjective:    Patient ID: Angela Bentley, female    DOB: 06/10/1941, 78 y.o.   MRN: [id number]  HPI Here for Medicare wellness visit and follow up of chronic health conditions Reviewed form and advanced directives Reviewed other doctors Occasional drink of alcohol No tobacco now Tries to exercise regularly No falls No depression or anhedonia. Does feel "older" and is slowing down Vision is okay Screeching tinnitus in left ear---hearing is otherwise okay Independent with instrumental ADLs Mild memory issues---mostly name recall  Continues on therapy for her foot pain Apparent plantar fasciitis Ongoing balance issues as well Has slipped some with walking--due to resting feet Likely multifactorial but could be diabetic neuropathy component (though she just has a "swollen" feeling)  Continues yearly follow up now for meningioma Has been stable  Not checking sugars lately Did have cortisone shots in September for feet No hypoglycemic spells  No palpitations No chest pain or SOB No syncope. Balance issues but no dizziness  Some foot swelling--mostly ankles Calves feel full also---but no tenderness  Current Outpatient Medications on File Prior to Visit  Medication Sig Dispense Refill  . Ascorbic Acid (VITAMIN C) 100 MG CHEW Chew 200 mg by mouth daily as needed (immune system support).    . celecoxib (CELEBREX) 200 MG capsule TAKE 1 CAPSULE BY MOUTH ONCE DAILY AS NEEDED FOR MILD TO MODERATE PAIN 30 capsule 5  . diclofenac sodium (VOLTAREN) 1 % GEL Apply 4 g topically 4 (four) times daily. 100 g 2  . fluticasone (VERAMYST) 27.5 MCG/SPRAY nasal spray Place 2 sprays into the nose daily as needed for allergies.     . furosemide (LASIX) 20 MG tablet Take 1 tablet (20 mg total) by mouth daily as needed. 30 tablet 3  . glimepiride (AMARYL) 1 MG tablet TAKE 1 TABLET EVERY DAY WITH BREAKFAST 90 tablet 3  . losartan-hydrochlorothiazide (HYZAAR) 100-12.5 MG tablet Take 1 tablet by  mouth daily. 90 tablet 3  . metoprolol succinate (TOPROL-XL) 25 MG 24 hr tablet Take 1 tablet (25 mg total) by mouth daily. 90 tablet 3  . Multiple Vitamin (MULTIVITAMIN) tablet Take 1 tablet by mouth daily.    . pioglitazone (ACTOS) 45 MG tablet Take 1 tablet (45 mg total) by mouth daily. 90 tablet 3  . potassium chloride SA (K-DUR,KLOR-CON) 20 MEQ tablet Take 1 tablet (20 mEq total) by mouth daily as needed. 30 tablet 3  . rivaroxaban (XARELTO) 20 MG TABS tablet Take 1 tablet (20 mg total) by mouth daily with supper. 90 tablet 3   No current facility-administered medications on file prior to visit.     Allergies  Allergen Reactions  . Penicillins     Has patient had a PCN reaction causing immediate rash, facial/tongue/throat swelling, SOB or lightheadedness with hypotension: Yes Has patient had a PCN reaction causing severe rash involving mucus membranes or skin necrosis: Yes Has patient had a PCN reaction that required hospitalization No Has patient had a PCN reaction occurring within the last 10 years: No If all of the above answers are "NO", then may proceed with Cephalosporin use.    . Sulfa Antibiotics     Rash/itching  . Azithromycin Palpitations    Past Medical History:  Diagnosis Date  . Brain tumor ([place])    a. Left intraventricular tumor ->stable by 06/2017 MRI. Followed @ [facility].  . Gait disturbance    a. Unsteady on feet/balance difficulty.  . Generalized osteoarthritis of multiple sites   . GERD (gastroesophageal reflux disease)   .  History of DVT (deep vein thrombosis) 2016   estrogen and airflights. Rx with xarelto for 3 months  . History of stress test    a. 11/2016 ETT: No ST/T changes.  Performed to assess PAC/PVC burden with activity ->No ectopy noted.  . Hypertension   . Hyperthyroidism    treated briefly in college  . Insomnia   . Obesity   . PAF (paroxysmal atrial fibrillation) ([place])    a. 12/2016 Event Monitor: brief run of PAF-->CHA2DS2VASc = 5-->  Xarelto.  Marland Kitchen Retinal tear of left eye   . Rosacea   . Type 2 diabetes, controlled, with peripheral neuropathy ([place])    a. 11/2016 A1c 7.4.  . Uterine fibroid     Past Surgical History:  Procedure Laterality Date  . CARPAL TUNNEL RELEASE Right    Dr Burney Gauze  . CATARACT EXTRACTION    . RETINAL TEAR REPAIR CRYOTHERAPY     10/09/13, then again 3/15    Family History  Problem Relation Age of Onset  . Diabetes Father   . Pancreatic cancer Father   . Diabetes Other        grandmother    Social History   Socioeconomic History  . Marital status: Married    Spouse name: Not on file  . Number of children: 3  . Years of education: Not on file  . Highest education level: Not on file  Occupational History  . Occupation: Non Patent attorney    Comment: [employer] and others  Social Needs  . Financial resource strain: Not on file  . Food insecurity:    Worry: Not on file    Inability: Not on file  . Transportation needs:    Medical: Not on file    Non-medical: Not on file  Tobacco Use  . Smoking status: Former Smoker    Last attempt to quit: 09/26/1993    Years since quitting: 25.0  . Smokeless tobacco: Never Used  . Tobacco comment: quit in 1996  Substance and Sexual Activity  . Alcohol use: Yes    Alcohol/week: 1.0 standard drinks    Types: 1 Standard drinks or equivalent per week    Comment: occasional wine  . Drug use: No  . Sexual activity: Never    Partners: Male    Comment: husband vasectomy  Lifestyle  . Physical activity:    Days per week: Not on file    Minutes per session: Not on file  . Stress: Not on file  Relationships  . Social connections:    Talks on phone: Not on file    Gets together: Not on file    Attends religious service: Not on file    Active member of club or organization: Not on file    Attends meetings of clubs or organizations: Not on file    Relationship status: Not on file  . Intimate partner violence:    Fear of current or ex partner: Not  on file    Emotionally abused: Not on file    Physically abused: Not on file    Forced sexual activity: Not on file  Other Topics Concern  . Not on file  Social History Narrative   Has living will   Husband is health care POA-- or daughter Izora Gala   Would accept resuscitation attempts   Would not want tube feeds if cognitively unaware   Review of Systems Appetite is fine Weight is fairly stable---or up slightly Sleep is still not great---trying melatonin (helps reinitiating) Naps  some in day Wears seat belt Teeth are fine---keeps up with dentist Bowels are okay---no blood  No urinary problems---wears pad due to mild stress incontinence Does have derm appt tomorrow--getting some blue light Rx for arms No heartburn or dysphagia    Objective:   Physical Exam  Constitutional: She is oriented to person, place, and time. She appears well-developed. No distress.  HENT:  Mouth/Throat: Oropharynx is clear and moist. No oropharyngeal exudate.  Neck: No thyromegaly present.  Cardiovascular: Normal rate, regular rhythm, normal heart sounds and intact distal pulses. Exam reveals no gallop.  No murmur heard. Respiratory: Effort normal and breath sounds normal. No respiratory distress. She has no wheezes. She has no rales.  GI: Soft. There is no abdominal tenderness.  Musculoskeletal:        General: No tenderness or edema.  Lymphadenopathy:    She has no cervical adenopathy.  Neurological: She is alert and oriented to person, place, and time.  President--- "Dwaine Deter, Bush" [phone] [id number] Recall 3/3  Fairly normal sensation in feet  Skin:  No foot lesions  Psychiatric: She has a normal mood and affect. Her behavior is normal.           Assessment & Plan:

## 2018-10-03 NOTE — Assessment & Plan Note (Signed)
2 provoked DVTs Continues on xarelto --mostly for atrial fib

## 2018-10-03 NOTE — Assessment & Plan Note (Signed)
See social history 

## 2018-10-03 NOTE — Assessment & Plan Note (Signed)
No recent symptoms Continues on xarelto

## 2018-10-03 NOTE — Assessment & Plan Note (Signed)
Stable on MRI Now yearly follow up with neurologist

## 2018-10-03 NOTE — Assessment & Plan Note (Signed)
I have personally reviewed the Medicare Annual Wellness questionnaire and have noted 1. The patient's medical and social history 2. Their use of alcohol, tobacco or illicit drugs 3. Their current medications and supplements 4. The patient's functional ability including ADL's, fall risks, home safety risks and hearing or visual             impairment. 5. Diet and physical activities 6. Evidence for depression or mood disorders  The patients weight, height, BMI and visual acuity have been recorded in the chart I have made referrals, counseling and provided education to the patient based review of the above and I have provided the pt with a written personalized care plan for preventive services.  I have provided you with a copy of your personalized plan for preventive services. Please take the time to review along with your updated medication list.  Probably done with cancer screening---will consider last colon (had polyps 5 years ago) and mammogrma Discussed fitness Yearly flu vaccine shingrix at the pharmacy

## 2018-10-03 NOTE — Assessment & Plan Note (Signed)
Hopefully still good control Just slightly abnormal sensation in feet (swollen sensation)

## 2018-10-04 DIAGNOSIS — L570 Actinic keratosis: Secondary | ICD-10-CM | POA: Diagnosis not present

## 2018-10-08 ENCOUNTER — Encounter: Payer: Self-pay | Admitting: Physical Therapy

## 2018-10-08 ENCOUNTER — Ambulatory Visit: Payer: Medicare Other | Admitting: Physical Therapy

## 2018-10-08 DIAGNOSIS — R262 Difficulty in walking, not elsewhere classified: Secondary | ICD-10-CM | POA: Diagnosis not present

## 2018-10-08 DIAGNOSIS — M79672 Pain in left foot: Secondary | ICD-10-CM

## 2018-10-08 DIAGNOSIS — M6281 Muscle weakness (generalized): Secondary | ICD-10-CM | POA: Diagnosis not present

## 2018-10-08 DIAGNOSIS — M79671 Pain in right foot: Secondary | ICD-10-CM | POA: Diagnosis not present

## 2018-10-08 DIAGNOSIS — R2681 Unsteadiness on feet: Secondary | ICD-10-CM | POA: Diagnosis not present

## 2018-10-08 NOTE — Therapy (Signed)
[place] MAIN [facility] SERVICES [address] [place], [place], [address] Phone: [phone]   Fax:  [phone]  Physical Therapy Treatment  Patient Details  Name: Angela Bentley MRN: [id number] Date of Birth: 13-Jan-1941 Referring Provider (PT): Dr. [name]   Encounter Date: 10/08/2018  PT End of Session - 10/08/18 1033    Visit Number  13    Number of Visits  21    Date for PT Re-Evaluation  10/25/18    Authorization Type  3/10 Start of care 08/14/18    PT Start Time  1016    PT Stop Time  1100    PT Time Calculation (min)  44 min    Activity Tolerance  Patient tolerated treatment well;No increased pain    Behavior During Therapy  WFL for tasks assessed/performed       Past Medical History:  Diagnosis Date  . Brain tumor ([place])    a. Left intraventricular tumor ->stable by 06/2017 MRI. Followed @ [facility].  . Gait disturbance    a. Unsteady on feet/balance difficulty.  . Generalized osteoarthritis of multiple sites   . GERD (gastroesophageal reflux disease)   . History of DVT (deep vein thrombosis) 2016   estrogen and airflights. Rx with xarelto for 3 months  . History of stress test    a. 11/2016 ETT: No ST/T changes.  Performed to assess PAC/PVC burden with activity ->No ectopy noted.  . Hypertension   . Hyperthyroidism    treated briefly in college  . Insomnia   . Obesity   . PAF (paroxysmal atrial fibrillation) ([place])    a. 12/2016 Event Monitor: brief run of PAF-->CHA2DS2VASc = 5--> Xarelto.  Marland Kitchen Retinal tear of left eye   . Rosacea   . Type 2 diabetes, controlled, with peripheral neuropathy ([place])    a. 11/2016 A1c 7.4.  . Uterine fibroid     Past Surgical History:  Procedure Laterality Date  . CARPAL TUNNEL RELEASE Right    Dr Burney Gauze  . CATARACT EXTRACTION    . RETINAL TEAR REPAIR CRYOTHERAPY     10/09/13, then again 3/15    There were no vitals filed for this visit.  Subjective Assessment - 10/08/18 1029    Subjective   Patient reports increased soreness in right heel today and states that she has been walking on her toes more to avoid initial contact on heel; Patient also reports increased left ankle soreness due to wet weather;     Pertinent History  78 yo Female reports history of bilateral foot plantar fasciitis since August 2019 which has been bothering her since; She went to see Dr. Milinda Pointer who did injection in bilateral feet end of September 2019. She was then given soft ankle brace to wear. she reports not really getting relief in right foot. A week after wearing the brace she started having severe pain in left foot along tarsal joint (anterior foot), she stopped wearing brace. she went to see Dr. [name] who recommended she follow up with Dr. Milinda Pointer who did do x-ray which was negative for fracture. She reports still having bilateral foot pain and intermittent swelling; She is walking without assistive device but her walk is antalgic with decreased heel strike;     How long can you sit comfortably?  NA    How long can you stand comfortably?  has pain in feet with initial standing, and will continue to hurt with longer period of standing;     How long can  you walk comfortably?  hurts with most walking; has limited walking; not walking the dog    Diagnostic tests  X-ray negative for fracture;     Patient Stated Goals  improve walking and reduce foot pain;     Currently in Pain?  Yes    Pain Score  5     Pain Location  Heel    Pain Orientation  Right    Pain Descriptors / Indicators  Sharp    Pain Type  Chronic pain    Pain Onset  More than a month ago    Pain Frequency  Intermittent    Aggravating Factors   worse with initial standing    Pain Relieving Factors  rest/ice    Effect of Pain on Daily Activities  decreased activity tolerance;     Multiple Pain Sites  Yes    Pain Score  2    Pain Location  Ankle    Pain Orientation  Left    Pain Descriptors / Indicators  Aching;Tightness    Pain Type  Chronic  pain    Pain Onset  1 to 4 weeks ago    Pain Frequency  Intermittent    Aggravating Factors   movement of foot    Pain Relieving Factors  rest    Effect of Pain on Daily Activities  decreased walking tolerance;              TREATMENT: PT applied ice massage to patient's right heel to improve tissue extensibility and reduce pain x10 min utilizing cross friction techniques for better tissue mobility; Tolerated well reporting no heel pain upon standing;  NMR:  Instructed patient in BAPs board: L3, LLEand RLEankle DF/PF, IV/EV, circles clockwise/counterclockwisex15 reps each with cues for positioning and exercise technique; Patient did have difficulty requiring increased time to isolate intrinsic foot muscles for better ankle control;  Standing on 1/2 bolster: Flat side up: -tandem stance, unsupported 10 sec hold x2 reps each foot in front to challenge ankle control, CGA for safety;  Standing on 1/2 bolster (flat side up) -feet apart, head turns side/side, up/down x10 reps each, unsupported with min A for safety; Patient required min VCs for balance stability, including to increase trunk control for less loss of balance with smaller base of support   Following session, patient reports no pain in foot and states, "I feel a lot better."                      PT Education - 10/08/18 1033    Education Details  exercise technique/strengthening;     Person(s) Educated  Patient    Methods  Explanation;Verbal cues    Comprehension  Verbalized understanding;Returned demonstration;Verbal cues required;Need further instruction       PT Short Term Goals - 09/27/18 0940      PT SHORT TERM GOAL #1   Title  Patient will be adherent to HEP at least 3x a week to improve functional strength and balance for better safety at home.    Time  4    Period  Weeks    Status  Achieved    Target Date  10/25/18      PT SHORT TERM GOAL #2   Title  Patient will report worse  pain in right foot of 4/10 over last few days to exhibit improved tolerance with ADLs;     Baseline  1/2: 4-5/10    Time  4    Period  Weeks  Status  Partially Met      PT SHORT TERM GOAL #3   Title  Patient will report worst pain in left foot of 4/10 in last few days to exhibit improved tolerance with ADLs;     Baseline  1/2: 2/10    Time  4    Period  Weeks    Status  Achieved        PT Long Term Goals - 09/27/18 0941      PT LONG TERM GOAL #1   Title  Patient will increase 10 meter walk test to >1.54ms without antalgic gait, as to improve gait speed for better community ambulation and to reduce fall risk.    Time  8    Period  Weeks    Status  Achieved    Target Date  10/25/18      PT LONG TERM GOAL #2   Title  Patient will be able to sit for 30 min and report <2/10 foot pain upon standing to exhibit improved tolerance with sitting for meals and other ADL tasks.     Time  4    Period  Weeks    Status  Partially Met    Target Date  10/25/18      PT LONG TERM GOAL #3   Title  Patient will improve ankle AROM DF >5 degrees bilaterally to improve functional ROM for ADLs such as negotiating stairs.     Baseline  1/2: R: 4 degrees, L: 5 degrees    Time  4    Period  Weeks    Status  Partially Met    Target Date  10/25/18      PT LONG TERM GOAL #4   Title  Patient will complete >1000 feet on 6 min walk test without increase in foot pain to improve community ambulator distance for functional tasks and return to PLOF.     Time  4    Period  Weeks    Status  Partially Met    Target Date  10/25/18            Plan - 10/09/18 0803    Clinical Impression Statement  Patient is progressing fairly. She is still having intermittent heel pain. She responded well to ice massage with no pain at end of session. She is tolerating the balance exercise and ankle motor control exercise well. She does require cues for correct exercise technique. Patient would benefit from additional  skilled PT intervention to improve strength, balance and gait safety;     Rehab Potential  Good    Clinical Impairments Affecting Rehab Potential  motivated, has good caregiver support    PT Frequency  2x / week    PT Duration  4 weeks    PT Treatment/Interventions  Cryotherapy;Iontophoresis 462mml Dexamethasone;Moist Heat;Ultrasound;Gait training;Stair training;Functional mobility training;Therapeutic activities;Therapeutic exercise;Balance training;Neuromuscular re-education;Patient/family education;Orthotic Fit/Training;Manual techniques;Passive range of motion;Dry needling;Energy conservation;Splinting;Taping    PT Next Visit Plan  advance HEP, manual, ionto/taping    PT Home Exercise Plan  initiated, see patient instructions    Consulted and Agree with Plan of Care  Patient       Patient will benefit from skilled therapeutic intervention in order to improve the following deficits and impairments:  Abnormal gait, Decreased activity tolerance, Pain, Difficulty walking, Decreased mobility, Decreased balance, Decreased range of motion, Impaired flexibility  Visit Diagnosis: Pain in right foot  Pain in left foot  Difficulty in walking, not elsewhere classified  Unsteadiness on feet  Muscle weakness (generalized)     Problem List Patient Active Problem List   Diagnosis Date Noted  . Tinnitus, bilateral 11/28/2017  . Abnormal MRI of head 11/14/2017  . Meningioma ([place]) 09/21/2017  . Ataxia 05/11/2017  . PAF (paroxysmal atrial fibrillation) ([place]) 02/03/2017  . Preventative health care 06/02/2016  . Advance directive discussed with patient 06/02/2016  . Achilles tendonitis 03/11/2016  . Hypertension   . DVT, lower extremity, recurrent ([place])   . Type 2 diabetes, controlled, with peripheral neuropathy ([place])   . Generalized osteoarthritis of multiple sites   . GERD (gastroesophageal reflux disease)   . Hyperthyroidism   . Retinal hole of right eye 06/26/2014  . Amblyopia, right  05/08/2014  . Status post intraocular lens implant 05/08/2014    , PT, DPT 10/09/2018, 8:04 AM  [place] MAIN [facility] SERVICES [address] [place], [place], [address] Phone: [phone]   Fax:  [phone]  Name: Angela Bentley MRN: [id number] Date of Birth: [birth year]

## 2018-10-10 ENCOUNTER — Ambulatory Visit: Payer: Medicare Other | Admitting: Physical Therapy

## 2018-10-15 ENCOUNTER — Ambulatory Visit: Payer: Medicare Other | Admitting: Physical Therapy

## 2018-10-15 ENCOUNTER — Encounter: Payer: Self-pay | Admitting: Physical Therapy

## 2018-10-15 DIAGNOSIS — M79672 Pain in left foot: Secondary | ICD-10-CM

## 2018-10-15 DIAGNOSIS — M79671 Pain in right foot: Secondary | ICD-10-CM

## 2018-10-15 DIAGNOSIS — M6281 Muscle weakness (generalized): Secondary | ICD-10-CM

## 2018-10-15 DIAGNOSIS — R2681 Unsteadiness on feet: Secondary | ICD-10-CM

## 2018-10-15 DIAGNOSIS — R262 Difficulty in walking, not elsewhere classified: Secondary | ICD-10-CM

## 2018-10-15 NOTE — Therapy (Signed)
[place] MAIN [facility] SERVICES [address] [place], [place], [address] Phone: [phone]   Fax:  [phone]  Physical Therapy Treatment  Patient Details  Name: Angela Bentley MRN: [id number] Date of Birth: 20-Jun-1941 Referring Provider (PT): Dr. [name]   Encounter Date: 10/15/2018  PT End of Session - 10/15/18 1128    Visit Number  14    Number of Visits  21    Date for PT Re-Evaluation  10/25/18    Authorization Type  4/10 Start of care 08/14/18    PT Start Time  1017    PT Stop Time  1100    PT Time Calculation (min)  43 min    Activity Tolerance  Patient tolerated treatment well;No increased pain    Behavior During Therapy  WFL for tasks assessed/performed       Past Medical History:  Diagnosis Date  . Brain tumor ([place])    a. Left intraventricular tumor ->stable by 06/2017 MRI. Followed @ [facility].  . Gait disturbance    a. Unsteady on feet/balance difficulty.  . Generalized osteoarthritis of multiple sites   . GERD (gastroesophageal reflux disease)   . History of DVT (deep vein thrombosis) 2016   estrogen and airflights. Rx with xarelto for 3 months  . History of stress test    a. 11/2016 ETT: No ST/T changes.  Performed to assess PAC/PVC burden with activity ->No ectopy noted.  . Hypertension   . Hyperthyroidism    treated briefly in college  . Insomnia   . Obesity   . PAF (paroxysmal atrial fibrillation) ([place])    a. 12/2016 Event Monitor: brief run of PAF-->CHA2DS2VASc = 5--> Xarelto.  Marland Kitchen Retinal tear of left eye   . Rosacea   . Type 2 diabetes, controlled, with peripheral neuropathy ([place])    a. 11/2016 A1c 7.4.  . Uterine fibroid     Past Surgical History:  Procedure Laterality Date  . CARPAL TUNNEL RELEASE Right    Dr Burney Gauze  . CATARACT EXTRACTION    . RETINAL TEAR REPAIR CRYOTHERAPY     10/09/13, then again 3/15    There were no vitals filed for this visit.  Subjective Assessment - 10/15/18 1021    Subjective   Patient reports increased RLE knee pain which is worse with movement; Overall she reports less heel pain;     Pertinent History  78 yo Female reports history of bilateral foot plantar fasciitis since August 2019 which has been bothering her since; She went to see Dr. Milinda Pointer who did injection in bilateral feet end of September 2019. She was then given soft ankle brace to wear. she reports not really getting relief in right foot. A week after wearing the brace she started having severe pain in left foot along tarsal joint (anterior foot), she stopped wearing brace. she went to see Dr. [name] who recommended she follow up with Dr. Milinda Pointer who did do x-ray which was negative for fracture. She reports still having bilateral foot pain and intermittent swelling; She is walking without assistive device but her walk is antalgic with decreased heel strike;     How long can you sit comfortably?  NA    How long can you stand comfortably?  has pain in feet with initial standing, and will continue to hurt with longer period of standing;     How long can you walk comfortably?  hurts with most walking; has limited walking; not walking the dog    Diagnostic  tests  X-ray negative for fracture;     Patient Stated Goals  improve walking and reduce foot pain;     Currently in Pain?  Yes    Pain Score  5     Pain Location  Knee    Pain Orientation  Right    Pain Descriptors / Indicators  Other (Comment)   joint pain   Pain Type  Acute pain    Pain Radiating Towards  anterior knee    Pain Onset  More than a month ago    Pain Frequency  Intermittent    Aggravating Factors   worse with transfers/locking the knee    Pain Relieving Factors  rest/ice; has been using diclofenac    Effect of Pain on Daily Activities  decreased tolerance with weight bearing activity;     Pain Onset  1 to 4 weeks ago            TREATMENT: Patient reports increased RLE knee pain PT identified increased tenderness along inferior right  knee with pain with superior patella glides; Also identified increased lateral tracking with quad sets;  PT applied Mcconnell tape with medial glide for better patella tracking; Patient reports slight reduction in knee pain upon standing;   NMR:  Standing on airex pad: -feet together, eyes open/closed 10 sec hold x3 sets with min VCs for weight shift for better erect posture; -Staggered stance, head turns side/side, up/down x5 reps each foot in front; Patient required min VCs for balance stability, including to increase trunk control for less loss of balance with smaller base of support; does exhibit better control with staggered stance; educated patient on ways to improve stance control with standing with wider base of support in the community.    Standing on 1/2 bolster: Flat side up: -heel/toe rock x15 with B rail assist; -tandem stance, unsupported BUE wand flexion x10 reps each foot in front to challenge ankle control, CGA for safety; -feet apart, head turns side/side, up/down x10 reps each, unsupported with min A for safety; Patient required min VCs for balance stability, including to increase trunk control for less loss of balance with smaller base of support   Reinforced HEP, patient denies any pain at end of session. She reports no heel pain during balance activities;                  PT Education - 10/15/18 1128    Education Details  exercise technique/balance; HEP reinforced;     Person(s) Educated  Patient    Methods  Explanation;Verbal cues    Comprehension  Verbalized understanding;Returned demonstration;Verbal cues required;Need further instruction       PT Short Term Goals - 09/27/18 0940      PT SHORT TERM GOAL #1   Title  Patient will be adherent to HEP at least 3x a week to improve functional strength and balance for better safety at home.    Time  4    Period  Weeks    Status  Achieved    Target Date  10/25/18      PT SHORT TERM GOAL #2    Title  Patient will report worse pain in right foot of 4/10 over last few days to exhibit improved tolerance with ADLs;     Baseline  1/2: 4-5/10    Time  4    Period  Weeks    Status  Partially Met      PT SHORT TERM GOAL #3   Title  Patient will report worst pain in left foot of 4/10 in last few days to exhibit improved tolerance with ADLs;     Baseline  1/2: 2/10    Time  4    Period  Weeks    Status  Achieved        PT Long Term Goals - 09/27/18 0941      PT LONG TERM GOAL #1   Title  Patient will increase 10 meter walk test to >1.74ms without antalgic gait, as to improve gait speed for better community ambulation and to reduce fall risk.    Time  8    Period  Weeks    Status  Achieved    Target Date  10/25/18      PT LONG TERM GOAL #2   Title  Patient will be able to sit for 30 min and report <2/10 foot pain upon standing to exhibit improved tolerance with sitting for meals and other ADL tasks.     Time  4    Period  Weeks    Status  Partially Met    Target Date  10/25/18      PT LONG TERM GOAL #3   Title  Patient will improve ankle AROM DF >5 degrees bilaterally to improve functional ROM for ADLs such as negotiating stairs.     Baseline  1/2: R: 4 degrees, L: 5 degrees    Time  4    Period  Weeks    Status  Partially Met    Target Date  10/25/18      PT LONG TERM GOAL #4   Title  Patient will complete >1000 feet on 6 min walk test without increase in foot pain to improve community ambulator distance for functional tasks and return to PLOF.     Time  4    Period  Weeks    Status  Partially Met    Target Date  10/25/18            Plan - 10/15/18 1128    Clinical Impression Statement  Patient progressing well. She is not having any heel pain today but does report increased right knee pain; PT assessed knee and localized pain to patella with lateral mal-tracking which could be contributing to symptoms. She tolerated Mcconnell tape well with slight reduction  in pain; PT instructed patient in advanced balance activities to improve static/dynamic balance control. She does have difficulty with narrow base of support especially with eyes closed; Patient would benefit from additional skilled PT intervention to improve strength, balance and gait safety;     Rehab Potential  Good    Clinical Impairments Affecting Rehab Potential  motivated, has good caregiver support    PT Frequency  2x / week    PT Duration  4 weeks    PT Treatment/Interventions  Cryotherapy;Iontophoresis 465mml Dexamethasone;Moist Heat;Ultrasound;Gait training;Stair training;Functional mobility training;Therapeutic activities;Therapeutic exercise;Balance training;Neuromuscular re-education;Patient/family education;Orthotic Fit/Training;Manual techniques;Passive range of motion;Dry needling;Energy conservation;Splinting;Taping    PT Next Visit Plan  advance HEP, manual, ionto/taping    PT Home Exercise Plan  initiated, see patient instructions    Consulted and Agree with Plan of Care  Patient       Patient will benefit from skilled therapeutic intervention in order to improve the following deficits and impairments:  Abnormal gait, Decreased activity tolerance, Pain, Difficulty walking, Decreased mobility, Decreased balance, Decreased range of motion, Impaired flexibility  Visit Diagnosis: Pain in right foot  Pain in left foot  Difficulty in walking,  not elsewhere classified  Unsteadiness on feet  Muscle weakness (generalized)     Problem List Patient Active Problem List   Diagnosis Date Noted  . Tinnitus, bilateral 11/28/2017  . Abnormal MRI of head 11/14/2017  . Meningioma ([place]) 09/21/2017  . Ataxia 05/11/2017  . PAF (paroxysmal atrial fibrillation) ([place]) 02/03/2017  . Preventative health care 06/02/2016  . Advance directive discussed with patient 06/02/2016  . Achilles tendonitis 03/11/2016  . Hypertension   . DVT, lower extremity, recurrent ([place])   . Type 2  diabetes, controlled, with peripheral neuropathy ([place])   . Generalized osteoarthritis of multiple sites   . GERD (gastroesophageal reflux disease)   . Hyperthyroidism   . Retinal hole of right eye 06/26/2014  . Amblyopia, right 05/08/2014  . Status post intraocular lens implant 05/08/2014    Analeigh Aries PT, DPT 10/15/2018, 11:31 AM  [place] MAIN [facility] SERVICES [address] [place], [place], [address] Phone: [phone]   Fax:  [phone]  Name: Ellon Marasco MRN: [id number] Date of Birth: [birth year]

## 2018-10-17 ENCOUNTER — Ambulatory Visit: Payer: Medicare Other | Admitting: Physical Therapy

## 2018-10-17 ENCOUNTER — Encounter: Payer: Self-pay | Admitting: Physical Therapy

## 2018-10-17 DIAGNOSIS — M79672 Pain in left foot: Secondary | ICD-10-CM

## 2018-10-17 DIAGNOSIS — M79671 Pain in right foot: Secondary | ICD-10-CM | POA: Diagnosis not present

## 2018-10-17 DIAGNOSIS — R2681 Unsteadiness on feet: Secondary | ICD-10-CM | POA: Diagnosis not present

## 2018-10-17 DIAGNOSIS — R262 Difficulty in walking, not elsewhere classified: Secondary | ICD-10-CM | POA: Diagnosis not present

## 2018-10-17 DIAGNOSIS — M6281 Muscle weakness (generalized): Secondary | ICD-10-CM

## 2018-10-17 NOTE — Patient Instructions (Signed)
Access Code: [email or web address]  URL: [email or web address]  Date: 10/17/2018  Prepared by: Blanche East   Exercises  ITB Stretch at [facility] - 2 reps - 3 sets - 15 hold - 1x daily - 7x weekly  Supine ITB Stretch - 2 reps - 3 sets - 15 hold - 1x daily - 7x weekly  Supine Active Straight Leg Raise - 10-15 reps - 2 sets - 1x daily - 7x weekly  Long Sitting Quad Set - 10 reps - 2 sets - 5 hold - 1x daily - 7x weekly  Patient Education  Iliotibial Band Syndrome

## 2018-10-17 NOTE — Therapy (Signed)
[place] MAIN [facility] SERVICES [address] [place], [place], [address] Phone: [phone]   Fax:  [phone]  Physical Therapy Treatment  Patient Details  Name: Angela Bentley MRN: [id number] Date of Birth: 1940/10/09 Referring Provider (PT): Dr. [name]   Encounter Date: 10/17/2018  PT End of Session - 10/17/18 1134    Visit Number  15    Number of Visits  21    Date for PT Re-Evaluation  10/25/18    Authorization Type  5/10 Start of care 08/14/18    PT Start Time  1017    PT Stop Time  1102    PT Time Calculation (min)  45 min    Activity Tolerance  Patient tolerated treatment well;No increased pain    Behavior During Therapy  WFL for tasks assessed/performed       Past Medical History:  Diagnosis Date  . Brain tumor ([place])    a. Left intraventricular tumor ->stable by 06/2017 MRI. Followed @ [facility].  . Gait disturbance    a. Unsteady on feet/balance difficulty.  . Generalized osteoarthritis of multiple sites   . GERD (gastroesophageal reflux disease)   . History of DVT (deep vein thrombosis) 2016   estrogen and airflights. Rx with xarelto for 3 months  . History of stress test    a. 11/2016 ETT: No ST/T changes.  Performed to assess PAC/PVC burden with activity ->No ectopy noted.  . Hypertension   . Hyperthyroidism    treated briefly in college  . Insomnia   . Obesity   . PAF (paroxysmal atrial fibrillation) ([place])    a. 12/2016 Event Monitor: brief run of PAF-->CHA2DS2VASc = 5--> Xarelto.  Marland Kitchen Retinal tear of left eye   . Rosacea   . Type 2 diabetes, controlled, with peripheral neuropathy ([place])    a. 11/2016 A1c 7.4.  . Uterine fibroid     Past Surgical History:  Procedure Laterality Date  . CARPAL TUNNEL RELEASE Right    Dr Burney Gauze  . CATARACT EXTRACTION    . RETINAL TEAR REPAIR CRYOTHERAPY     10/09/13, then again 3/15    There were no vitals filed for this visit.  Subjective Assessment - 10/17/18 1022    Subjective   Patient reports continued right knee pain;  She reports taking some celebrex with significant relief;     Pertinent History  78 yo Female reports history of bilateral foot plantar fasciitis since August 2019 which has been bothering her since; She went to see Dr. Milinda Pointer who did injection in bilateral feet end of September 2019. She was then given soft ankle brace to wear. she reports not really getting relief in right foot. A week after wearing the brace she started having severe pain in left foot along tarsal joint (anterior foot), she stopped wearing brace. she went to see Dr. [name] who recommended she follow up with Dr. Milinda Pointer who did do x-ray which was negative for fracture. She reports still having bilateral foot pain and intermittent swelling; She is walking without assistive device but her walk is antalgic with decreased heel strike;     How long can you sit comfortably?  NA    How long can you stand comfortably?  has pain in feet with initial standing, and will continue to hurt with longer period of standing;     How long can you walk comfortably?  hurts with most walking; has limited walking; not walking the dog    Diagnostic tests  X-ray negative for fracture;     Patient Stated Goals  improve walking and reduce foot pain;     Currently in Pain?  Yes    Pain Score  4     Pain Location  Knee    Pain Orientation  Right    Pain Descriptors / Indicators  Aching;Sore    Pain Type  Acute pain    Pain Onset  More than a month ago    Pain Frequency  Intermittent    Aggravating Factors   worse with transfers/locking the knee    Pain Relieving Factors  rest/ice; has been taking pain meds    Effect of Pain on Daily Activities  decreased tolerance with weight bearing activities    Multiple Pain Sites  Yes    Pain Score  1    Pain Location  Ankle    Pain Orientation  Left    Pain Descriptors / Indicators  Aching;Tightness    Pain Type  Chronic pain    Pain Onset  1 to 4 weeks ago    Pain  Frequency  Intermittent            TREATMENT: PT reinforced LE stretches and strengthening exercise to improve motor control and reduce knee discomfort: LAQ with hip ER x5 reps to initiate VMO contraction; patient tolerated well with no increase in pain;  Educated patient in IT band stretch in supine to be done prior to sleeping for less pain at night, 20 sec hold x2 reps bilaterally; Required min VCs for proper positioning to isolate IT band stretch; Patient reports no increase in pain with stretch;  VMO activation: -SLR hip flexion with hip ER x10 reps -Quad set with hip ER 5 sec hold x10 reps RLE only; Patient required min-moderate verbal/tactile cues for correct exercise technique including min VCs for proper positioning to improve VMO activation/facilitation; Patient denies any knee discomfort with VMO activation; Educated patient on importance of increasing VMO activity to reduce knee discomfort with better patella tracking;  Provided written handout with HEP for better adherence;  Gait training: PT assessed patient's gait at regular pace and fast pace. Regular pace is significantly slow with multiple stops and occasional veering, however patient able to exhibit good heel strike at initial contact; When walking quicker, she exhibits increased toe contact at initial contact with increased hip extension at initial contact and mid stance with erect posture; This is likely due to imbalance and a compensatory mechanism due to fear of falling and weakness in hip; PT assessed LE hip strength: Hip extension: 4-/5 bilaterally; Hip abduction: 3+/5 bilaterally;  Educated patient to work on increasing gait speed for better cardiovascular fitness. Instructed patient to increase hip flexion during swing to improve step length and better heel strike; however with increased hip flexion patient often hits with toe at initial contact with less heel strike; She would benefit from additional gait  training to improve overall gait mechanics for less fall risk and to reduce energy required during ambulation;                    PT Education - 10/17/18 1134    Education Details  HEP advanced, gait recommendations    Person(s) Educated  Patient    Methods  Explanation;Verbal cues    Comprehension  Verbalized understanding;Returned demonstration;Verbal cues required;Need further instruction       PT Short Term Goals - 09/27/18 0940      PT SHORT TERM GOAL #1  Title  Patient will be adherent to HEP at least 3x a week to improve functional strength and balance for better safety at home.    Time  4    Period  Weeks    Status  Achieved    Target Date  10/25/18      PT SHORT TERM GOAL #2   Title  Patient will report worse pain in right foot of 4/10 over last few days to exhibit improved tolerance with ADLs;     Baseline  1/2: 4-5/10    Time  4    Period  Weeks    Status  Partially Met      PT SHORT TERM GOAL #3   Title  Patient will report worst pain in left foot of 4/10 in last few days to exhibit improved tolerance with ADLs;     Baseline  1/2: 2/10    Time  4    Period  Weeks    Status  Achieved        PT Long Term Goals - 09/27/18 0941      PT LONG TERM GOAL #1   Title  Patient will increase 10 meter walk test to >1.40ms without antalgic gait, as to improve gait speed for better community ambulation and to reduce fall risk.    Time  8    Period  Weeks    Status  Achieved    Target Date  10/25/18      PT LONG TERM GOAL #2   Title  Patient will be able to sit for 30 min and report <2/10 foot pain upon standing to exhibit improved tolerance with sitting for meals and other ADL tasks.     Time  4    Period  Weeks    Status  Partially Met    Target Date  10/25/18      PT LONG TERM GOAL #3   Title  Patient will improve ankle AROM DF >5 degrees bilaterally to improve functional ROM for ADLs such as negotiating stairs.     Baseline  1/2: R: 4 degrees,  L: 5 degrees    Time  4    Period  Weeks    Status  Partially Met    Target Date  10/25/18      PT LONG TERM GOAL #4   Title  Patient will complete >1000 feet on 6 min walk test without increase in foot pain to improve community ambulator distance for functional tasks and return to PLOF.     Time  4    Period  Weeks    Status  Partially Met    Target Date  10/25/18            Plan - 10/17/18 1134    Clinical Impression Statement  Patient is progressing fair. She is reporting increased knee discomfort which could be related to OA as when she takes medication it is decreased. She also has started getting increased IT band discomfort as a result of increasing her walking to improve fitness. Patient educated in ways to improve gait mechanics for better gait safety. In addition, advanced HEP with stretches and exercise to improve LE motor control and flexibiltiy to reduce pain. patient would benefit from additional skilled PT intervention to improve balance and gait ability and reduce pain with weight bearing tasks.     Rehab Potential  Good    Clinical Impairments Affecting Rehab Potential  motivated, has good caregiver support  PT Frequency  2x / week    PT Duration  4 weeks    PT Treatment/Interventions  Cryotherapy;Iontophoresis 48m/ml Dexamethasone;Moist Heat;Ultrasound;Gait training;Stair training;Functional mobility training;Therapeutic activities;Therapeutic exercise;Balance training;Neuromuscular re-education;Patient/family education;Orthotic Fit/Training;Manual techniques;Passive range of motion;Dry needling;Energy conservation;Splinting;Taping    PT Next Visit Plan  advance HEP, manual, ionto/taping    PT Home Exercise Plan  initiated, see patient instructions    Consulted and Agree with Plan of Care  Patient       Patient will benefit from skilled therapeutic intervention in order to improve the following deficits and impairments:  Abnormal gait, Decreased activity  tolerance, Pain, Difficulty walking, Decreased mobility, Decreased balance, Decreased range of motion, Impaired flexibility  Visit Diagnosis: Pain in right foot  Pain in left foot  Difficulty in walking, not elsewhere classified  Unsteadiness on feet  Muscle weakness (generalized)     Problem List Patient Active Problem List   Diagnosis Date Noted  . Tinnitus, bilateral 11/28/2017  . Abnormal MRI of head 11/14/2017  . Meningioma ([place] 09/21/2017  . Ataxia 05/11/2017  . PAF (paroxysmal atrial fibrillation) ([place] 02/03/2017  . Preventative health care 06/02/2016  . Advance directive discussed with patient 06/02/2016  . Achilles tendonitis 03/11/2016  . Hypertension   . DVT, [place] extremity, recurrent ([place]   . Type 2 diabetes, controlled, with peripheral neuropathy ([place]   . Generalized osteoarthritis of multiple sites   . GERD (gastroesophageal reflux disease)   . Hyperthyroidism   . Retinal hole of right eye 06/26/2014  . Amblyopia, right 05/08/2014  . Status post intraocular lens implant 05/08/2014    Lovinia Snare,MargaretPT, DPT 10/17/2018, 11:38 AM  [place] [facility] [address] [address]Phone: [phone]  Fax:  [phone] Name: Angela SievertMRN: [id number] of Birth: 1Jul 08, 1942

## 2018-10-20 ENCOUNTER — Other Ambulatory Visit: Payer: Self-pay

## 2018-10-20 ENCOUNTER — Emergency Department
Admission: EM | Admit: 2018-10-20 | Discharge: 2018-10-20 | Disposition: A | Discharge status: Home / Self Care | Payer: Medicare Other | Attending: Emergency Medicine | Admitting: Emergency Medicine

## 2018-10-20 ENCOUNTER — Emergency Department: Payer: Medicare Other

## 2018-10-20 DIAGNOSIS — E119 Type 2 diabetes mellitus without complications: Secondary | ICD-10-CM | POA: Insufficient documentation

## 2018-10-20 DIAGNOSIS — Z7901 Long term (current) use of anticoagulants: Secondary | ICD-10-CM | POA: Insufficient documentation

## 2018-10-20 DIAGNOSIS — Z87891 Personal history of nicotine dependence: Secondary | ICD-10-CM | POA: Insufficient documentation

## 2018-10-20 DIAGNOSIS — M25561 Pain in right knee: Secondary | ICD-10-CM | POA: Diagnosis not present

## 2018-10-20 DIAGNOSIS — Z79899 Other long term (current) drug therapy: Secondary | ICD-10-CM | POA: Insufficient documentation

## 2018-10-20 DIAGNOSIS — I10 Essential (primary) hypertension: Secondary | ICD-10-CM | POA: Insufficient documentation

## 2018-10-20 MED ORDER — PREDNISONE 50 MG PO TABS: 50.0000 mg | ORAL_TABLET | Freq: Every day | ORAL | 0 refills | Status: DC

## 2018-10-20 NOTE — ED Provider Notes (Signed)
[facility] Emergency Department Provider Note  ____________________________________________  Time seen: Approximately 4:52 PM  I have reviewed the triage vital signs and the nursing notes.   HISTORY  Chief Complaint Knee Pain    HPI Angela Bentley is a 78 y.o. female who presents emergency department complaining of right knee pain.  Patient reports that she has been seeing physical therapy for plantar fasciitis of the right foot.  Approximately 2 weeks ago, patient started to develop knee pain while performing some the exercises recommended by physical therapy.  This has worsened with a significant increase of pain 4 days ago.  Patient denies any direct trauma to the knee.  She is denied any edema, erythema.  Pain is felt most with weightbearing.  She reports that it feels in the "center of her knee."  Patient reports that she has a catching/clicking sensation with full extension or flexion.  With some of the exercises in physical therapy, her knee will "lock" in place.  No history of previous surgeries to the knee.  She does have a history of DVT to the unaffected extremity but states that she has had no edema or erythema of the calf or other portions of her leg.  No     Past Medical History:  Diagnosis Date  . Brain tumor ([place])    a. Left intraventricular tumor ->stable by 06/2017 MRI. Followed @ [facility].  . Gait disturbance    a. Unsteady on feet/balance difficulty.  . Generalized osteoarthritis of multiple sites   . GERD (gastroesophageal reflux disease)   . History of DVT (deep vein thrombosis) 2016   estrogen and airflights. Rx with xarelto for 3 months  . History of stress test    a. 11/2016 ETT: No ST/T changes.  Performed to assess PAC/PVC burden with activity ->No ectopy noted.  . Hypertension   . Hyperthyroidism    treated briefly in college  . Insomnia   . Obesity   . PAF (paroxysmal atrial fibrillation) ([place])    a. 12/2016 Event Monitor: brief run  of PAF-->CHA2DS2VASc = 5--> Xarelto.  Marland Kitchen Retinal tear of left eye   . Rosacea   . Type 2 diabetes, controlled, with peripheral neuropathy ([place])    a. 11/2016 A1c 7.4.  . Uterine fibroid     Patient Active Problem List   Diagnosis Date Noted  . Tinnitus, bilateral 11/28/2017  . Abnormal MRI of head 11/14/2017  . Meningioma ([place]) 09/21/2017  . Ataxia 05/11/2017  . PAF (paroxysmal atrial fibrillation) ([place]) 02/03/2017  . Preventative health care 06/02/2016  . Advance directive discussed with patient 06/02/2016  . Achilles tendonitis 03/11/2016  . Hypertension   . DVT, lower extremity, recurrent ([place])   . Type 2 diabetes, controlled, with peripheral neuropathy ([employer])   . Generalized osteoarthritis of multiple sites   . GERD (gastroesophageal reflux disease)   . Hyperthyroidism   . Retinal hole of right eye 06/26/2014  . Amblyopia, right 05/08/2014  . Status post intraocular lens implant 05/08/2014    Past Surgical History:  Procedure Laterality Date  . CARPAL TUNNEL RELEASE Right    Dr Burney Gauze  . CATARACT EXTRACTION    . RETINAL TEAR REPAIR CRYOTHERAPY     10/09/13, then again 3/15    Prior to Admission medications   Medication Sig Start Date End Date Taking? Authorizing Provider  Ascorbic Acid (VITAMIN C) 100 MG CHEW Chew 200 mg by mouth daily as needed (immune system support).    [provider]  celecoxib (CELEBREX) 200 MG capsule TAKE 1 CAPSULE BY MOUTH ONCE DAILY AS NEEDED FOR MILD TO MODERATE PAIN 10/26/17   Viviana Simpler I, MD  diclofenac sodium (VOLTAREN) 1 % GEL Apply 4 g topically 4 (four) times daily. 07/25/18   Hyatt, Max T, DPM  fluticasone (VERAMYST) 27.5 MCG/SPRAY nasal spray Place 2 sprays into the nose daily as needed for allergies.     [provider]  furosemide (LASIX) 20 MG tablet Take 1 tablet (20 mg total) by mouth daily as needed. 03/13/18   Minna Merritts, MD  glimepiride (AMARYL) 1 MG tablet TAKE 1 TABLET EVERY DAY WITH BREAKFAST  03/23/18   [name], MD  losartan-hydrochlorothiazide (HYZAAR) 100-12.5 MG tablet Take 1 tablet by mouth daily. 03/23/18   [name], MD  metoprolol succinate (TOPROL-XL) 25 MG 24 hr tablet Take 1 tablet (25 mg total) by mouth daily. 03/23/18   [name], MD  Multiple Vitamin (MULTIVITAMIN) tablet Take 1 tablet by mouth daily.    [provider]  pioglitazone (ACTOS) 45 MG tablet Take 1 tablet (45 mg total) by mouth daily. 03/23/18   [name], MD  potassium chloride SA (K-DUR,KLOR-CON) 20 MEQ tablet Take 1 tablet (20 mEq total) by mouth daily as needed. 03/13/18   Minna Merritts, MD  predniSONE (DELTASONE) 50 MG tablet Take 1 tablet (50 mg total) by mouth daily with breakfast. 10/20/18   Tayten Bergdoll, Charline Bills, PA-C  rivaroxaban (XARELTO) 20 MG TABS tablet Take 1 tablet (20 mg total) by mouth daily with supper. 03/23/18   [name], MD    Allergies Penicillins; Sulfa antibiotics; and Azithromycin  Family History  Problem Relation Age of Onset  . Diabetes Father   . Pancreatic cancer Father   . Diabetes Other        grandmother    Social History Social History   Tobacco Use  . Smoking status: Former Smoker    Last attempt to quit: 09/26/1993    Years since quitting: 25.0  . Smokeless tobacco: Never Used  . Tobacco comment: quit in 1996  Substance Use Topics  . Alcohol use: Yes    Alcohol/week: 1.0 standard drinks    Types: 1 Standard drinks or equivalent per week    Comment: occasional wine  . Drug use: No     Review of Systems  Constitutional: No fever/chills Eyes: No visual changes.  Cardiovascular: no chest pain. Respiratory: no cough. No SOB. Gastrointestinal: No abdominal pain.  No nausea, no vomiting.  Musculoskeletal: Positive for R knee pain Skin: Negative for rash, abrasions, lacerations, ecchymosis. Neurological: Negative for headaches, focal weakness or numbness. 10-point ROS otherwise  negative.  ____________________________________________   PHYSICAL EXAM:  VITAL SIGNS: ED Triage Vitals  Enc Vitals Group     BP --      Pulse Rate 10/20/18 1637 77     Resp 10/20/18 1637 18     Temp 10/20/18 1637 97.9 F (36.6 C)     Temp Source 10/20/18 1637 Oral     SpO2 10/20/18 1637 98 %     Weight 10/20/18 1639 200 lb (90.7 kg)     Height 10/20/18 1639 5\' 6"  (1.676 m)     Head Circumference --      Peak Flow --      Pain Score 10/20/18 1639 3     Pain Loc --      Pain Edu? --      Excl. in [place]? --  Constitutional: Alert and oriented. Well appearing and in no acute distress. Eyes: Conjunctivae are normal. PERRL. EOMI. Head: Atraumatic. Neck: No stridor.    Cardiovascular: Normal rate, regular rhythm. Normal S1 and S2.  Good peripheral circulation. Respiratory: Normal respiratory effort without tachypnea or retractions. Lungs CTAB. Good air entry to the bases with no decreased or absent breath sounds. Musculoskeletal: Full range of motion to all extremities. No gross deformities appreciated.  Visualization of the right knee reveals no gross edema, erythema, ecchymosis.  Patient is able to extend and flex the knee fully.  Palpation over the osseous structures of the knee to include patella, bilateral joint lines reveals no tenderness.  No palpable abnormality.  Varus, valgus, Lachman's is negative.  McMurray's is positive.  Dorsalis pedis pulse intact distally.  Sensation intact distally. Neurologic:  Normal speech and language. No gross focal neurologic deficits are appreciated.  Skin:  Skin is warm, dry and intact. No rash noted. Psychiatric: Mood and affect are normal. Speech and behavior are normal. Patient exhibits appropriate insight and judgement.   ____________________________________________   LABS (all labs ordered are listed, but only abnormal results are displayed)  Labs Reviewed - No data to  display ____________________________________________  EKG   ____________________________________________  RADIOLOGY I personally viewed and evaluated these images as part of my medical decision making, as well as reviewing the written report by the radiologist.  Dg Knee Complete 4 Views Right  Result Date: 10/20/2018 CLINICAL DATA:  Right knee pain and limited range of motion for 3 weeks. No known injury. EXAM: RIGHT KNEE - COMPLETE 4+ VIEW COMPARISON:  None. FINDINGS: No evidence of fracture, dislocation, or joint effusion. No evidence of arthropathy or other focal bone abnormality. Soft tissues are unremarkable. IMPRESSION: Negative. Electronically Signed   By: Earle Gell M.D.   On: 10/20/2018 17:52    ____________________________________________    PROCEDURES  Procedure(s) performed:    Procedures    Medications - No data to display   ____________________________________________   INITIAL IMPRESSION / ASSESSMENT AND PLAN / ED COURSE  Pertinent labs & imaging results that were available during my care of the patient were reviewed by me and considered in my medical decision making (see chart for details).  Review of the [place] CSRS was performed in accordance of the [employer] prior to dispensing any controlled drugs.      Patient's diagnosis is consistent with knee pain likely secondary to meniscal derangement.  Patient presents emergency department with significant right knee pain.  This is nontraumatic in nature.  No edema or erythema identified.  Differential includes strain versus fracture versus ligament rupture or tear, meniscal injury, septic arthritis, gout.  On exam, no indication for infectious origin.  No indication of gout.  Based off of exam and reported symptoms, likely meniscal injury.  Patient will be placed in knee immobilizer, referred to orthopedics.. Patient will be discharged home with prescriptions for prednisone. Patient is to follow up with orthopedics as  needed or otherwise directed. Patient is given ED precautions to return to the ED for any worsening or new symptoms.     ____________________________________________  FINAL CLINICAL IMPRESSION(S) / ED DIAGNOSES  Final diagnoses:  Acute pain of right knee      NEW MEDICATIONS STARTED DURING THIS VISIT:  ED Discharge Orders         Ordered    predniSONE (DELTASONE) 50 MG tablet  Daily with breakfast     10/20/18 1852  This chart was dictated using voice recognition software/Dragon. Despite best efforts to proofread, errors can occur which can change the meaning. Any change was purely unintentional.    Darletta Moll, PA-C 10/20/18 1853    Carrie Mew, MD 10/21/18 [phone]

## 2018-10-20 NOTE — ED Notes (Signed)
Pt declined crutches; says she would feel better with a walker; says when she gets back to [facility] she can get one there;

## 2018-10-20 NOTE — ED Notes (Signed)
Pt c/o right knee pain since Thursday states when she got up she felt like her knee was going to give way and has been having difficulty walking since. Pt states she has taken 2 doses of celebrex even though she is on Xarelto for DVT.  Pt states she has noticed some swelling but no redness. Pt states her son gets gout in his knee as well and thinks it may be gout.

## 2018-10-20 NOTE — ED Triage Notes (Addendum)
Pt arrives A&O, in wheelchair. Thursday AM stated R knee pain that caused trouble standing. Is already seeing PT at [facility] for balance/mobility and plantar fascitis. States son has gout and told pt that is sounds same. States knee is red/swollen. Denies injury.

## 2018-10-21 ENCOUNTER — Encounter: Payer: Self-pay | Admitting: Physical Therapy

## 2018-10-22 ENCOUNTER — Encounter: Payer: Self-pay | Admitting: Physical Therapy

## 2018-10-23 ENCOUNTER — Ambulatory Visit: Payer: Medicare Other | Admitting: Physical Therapy

## 2018-10-23 NOTE — Progress Notes (Signed)
Dr. Frederico Hamman T. Sameena Artus, MD, [facility] [place], [address] Phone: [phone] Fax: [phone]  10/25/2018  Patient: Angela Bentley, MRN: [id number], DOB: 04/20/1941, 78 y.o.  Primary Physician:  [name], MD   Chief Complaint  Patient presents with  . Knee Pain    Right   Subjective:   Angela Bentley is a 78 y.o. very pleasant female patient who presents with the following:  78 year old female with a history of A. fib who is on Xarelto chronically who presents with acute knee pain over the last several weeks.  She was doing some rehabilitation for her foot and ankle and developed some pain in her right knee.  She is having some difficulty walking, and ultimately she went to the emergency department for evaluation.  She walks in the office limping considerably and using a walker.  Woke up last Thursday morning.   Stepped on the floor and had an acute pain in the knee.  The day before, was doing some IT band work and some basic balance work.   R knee. No fx or surgical scars.  Clicking some with movement.  No locking up of the joint. Was having some difficulty with ext for some time.   Does not have osteoporosis or osteopenia.    Nonweightbearing trauma series was done of the right knee.  This is independently reviewed by myself.  There is no fracture or dislocation, no obvious foreign bodies, and no appreciable osteophytes are present.  Lack of weightbearing films limits evaluation of degenerative joint disease and joint spaces. Electronically Signed  By: Owens Loffler, MD On: 10/25/2018 8:55 AM   She does appear to be taking some Celebrex intermittently along with her Xarelto.  In the emergency room, she was given 5 days of prednisone 50 mg.  The patient does have diabetes mellitus. Lab Results  Component Value Date   HGBA1C 7.4 (H) 10/03/2018    Past Medical History, Surgical History, Social History,  Family History, Problem List, Medications, and Allergies have been reviewed and updated if relevant.  Patient Active Problem List   Diagnosis Date Noted  . Tinnitus, bilateral 11/28/2017  . Abnormal MRI of head 11/14/2017  . Meningioma ([facility] [facility]) 09/21/2017  . Ataxia 05/11/2017  . PAF (paroxysmal atrial fibrillation) ([place] [facility]) 02/03/2017  . Preventative health care 06/02/2016  . Advance directive discussed with patient 06/02/2016  . Achilles tendonitis 03/11/2016  . Hypertension   . DVT, lower extremity, recurrent ([facility])   . Type 2 diabetes, controlled, with peripheral neuropathy ([place])   . Generalized osteoarthritis of multiple sites   . GERD (gastroesophageal reflux disease)   . Hyperthyroidism   . Retinal hole of right eye 06/26/2014  . Amblyopia, right 05/08/2014  . Status post intraocular lens implant 05/08/2014    Past Medical History:  Diagnosis Date  . Brain tumor ([place] [facility])    a. Left intraventricular tumor ->stable by 06/2017 MRI. Followed @ [facility].  . Gait disturbance    a. Unsteady on feet/balance difficulty.  . Generalized osteoarthritis of multiple sites   . GERD (gastroesophageal reflux disease)   . History of DVT (deep vein thrombosis) 2016   estrogen and airflights. Rx with xarelto for 3 months  . History of stress test    a. 11/2016 ETT: No ST/T changes.  Performed to assess PAC/PVC burden with activity ->No ectopy noted.  . Hypertension   . Hyperthyroidism    treated briefly in college  .  Insomnia   . Obesity   . PAF (paroxysmal atrial fibrillation) ([facility] [facility])    a. 12/2016 Event Monitor: brief run of PAF-->CHA2DS2VASc = 5--> Xarelto.  Marland Kitchen Retinal tear of left eye   . Rosacea   . Type 2 diabetes, controlled, with peripheral neuropathy ([facility])    a. 11/2016 A1c 7.4.  . Uterine fibroid     Past Surgical History:  Procedure Laterality Date  . CARPAL TUNNEL RELEASE Right    Dr Burney Gauze  . CATARACT EXTRACTION    . RETINAL TEAR REPAIR CRYOTHERAPY     10/09/13, then again 3/15     Social History   Socioeconomic History  . Marital status: Married    Spouse name: Not on file  . Number of children: 3  . Years of education: Not on file  . Highest education level: Not on file  Occupational History  . Occupation: Non Patent attorney    Comment: [employer] and others  Social Needs  . Financial resource strain: Not on file  . Food insecurity:    Worry: Not on file    Inability: Not on file  . Transportation needs:    Medical: Not on file    Non-medical: Not on file  Tobacco Use  . Smoking status: Former Smoker    Last attempt to quit: 09/26/1993    Years since quitting: 25.0  . Smokeless tobacco: Never Used  . Tobacco comment: quit in 1996  Substance and Sexual Activity  . Alcohol use: Yes    Alcohol/week: 1.0 standard drinks    Types: 1 Standard drinks or equivalent per week    Comment: occasional wine  . Drug use: No  . Sexual activity: Never    Partners: Male    Comment: husband vasectomy  Lifestyle  . Physical activity:    Days per week: Not on file    Minutes per session: Not on file  . Stress: Not on file  Relationships  . Social connections:    Talks on phone: Not on file    Gets together: Not on file    Attends religious service: Not on file    Active member of club or organization: Not on file    Attends meetings of clubs or organizations: Not on file    Relationship status: Not on file  . Intimate partner violence:    Fear of current or ex partner: Not on file    Emotionally abused: Not on file    Physically abused: Not on file    Forced sexual activity: Not on file  Other Topics Concern  . Not on file  Social History Narrative   Has living will   Husband is health care POA-- or daughter Izora Gala   Would accept resuscitation attempts   Would not want tube feeds if cognitively unaware    Family History  Problem Relation Age of Onset  . Diabetes Father   . Pancreatic cancer Father   . Diabetes Other        grandmother     Allergies  Allergen Reactions  . Penicillins     Has patient had a PCN reaction causing immediate rash, facial/tongue/throat swelling, SOB or lightheadedness with hypotension: Yes Has patient had a PCN reaction causing severe rash involving mucus membranes or skin necrosis: Yes Has patient had a PCN reaction that required hospitalization No Has patient had a PCN reaction occurring within the last 10 years: No If all of the above answers are "NO", then may proceed with Cephalosporin  use.    . Sulfa Antibiotics     Rash/itching  . Azithromycin Palpitations    Medication list reviewed and updated in full in [facility].  GEN: No fevers, chills. Nontoxic. Primarily MSK c/o today. MSK: Detailed in the HPI GI: tolerating PO intake without difficulty Neuro: No numbness, parasthesias, or tingling associated. Otherwise the pertinent positives of the ROS are noted above.   Objective:   BP 140/80   Pulse 84   Temp (!) 97.4 F (36.3 C) (Oral)   Ht 5\' 6"  (1.676 m)   Wt 212 lb 4 oz (96.3 kg)   LMP 09/26/1997   SpO2 95%   BMI 34.26 kg/m    GEN: WDWN, NAD, Non-toxic, Alert & Oriented x 3 HEENT: Atraumatic, Normocephalic.  Ears and Nose: No external deformity. EXTR: No clubbing/cyanosis/edema NEURO: notably antalgic gait. PSYCH: Normally interactive. Conversant. Not depressed or anxious appearing.  Calm demeanor.    Right knee has a mild effusion.  Excellent movement at the patella.  She has quite significant medial joint line tenderness, particularly posteriorly as well as some minor lateral joint line tenderness.  There is no pain at the patellar tendon or quad tendon.  Minimal peds anserine bursa tenderness.  Tibial plateau and proximal fibula are explored and are grossly nontender and have no pain with tuning fork application.  ACL, PCL, MCL, and LCL are stable.  She lacks 2 degrees of extension and can flex her knee to approximately 93 degrees.  Bounce home testing,  flexion pinch testing, and McMurray's testing are positive, McMurray's is positive for pain only.  Radiology: Dg Knee Complete 4 Views Right  Result Date: 10/20/2018 CLINICAL DATA:  Right knee pain and limited range of motion for 3 weeks. No known injury. EXAM: RIGHT KNEE - COMPLETE 4+ VIEW COMPARISON:  None. FINDINGS: No evidence of fracture, dislocation, or joint effusion. No evidence of arthropathy or other focal bone abnormality. Soft tissues are unremarkable. IMPRESSION: Negative. Electronically Signed   By: Earle Gell M.D.   On: 10/20/2018 17:52    Assessment and Plan:   Internal derangement of knee, acute, right - Plan: Ambulatory referral to Physical Therapy  Acute pain of right knee - Plan: Ambulatory referral to Physical Therapy  PAF (paroxysmal atrial fibrillation) ([facility])  Type 2 diabetes, controlled, with peripheral neuropathy ([place])  Strongly suspect medial plus or minus lateral meniscal pathology.  Degenerative meniscal tear.  We discussed various treatment options including conservative versus more aggressive treatment patterns.  She had successful nonoperative management of a left-sided meniscal tear about 10 years ago.  Were going to initiate nonoperative care with some physical therapy, I am going to have her stop her oral prednisone which she has not taken today.  Were going to do a right-sided intra-articular knee injection.  She also has a circulating deicing system that she will continue to use.  Walker as needed over the next 1 to 2 weeks and she will also start rehab in the pool in about 3 to 4 days.  Knee Injection, R Date of procedure: 10/25/2018 Patient verbally consented to procedure. Risks (including potential rare risk of infection), benefits, and alternatives explained. Sterilely prepped with Chloraprep. Ethyl cholride used for anesthesia. 8 cc Lidocaine 1% mixed with 2 mL Depo-Medrol 40 mg injected using the anteromedial approach without difficulty. No  complications with procedure and tolerated well. Patient had decreased pain post-injection. Medication: 2 mL of Depo-Medrol 40 mg, equaling Depo-Medrol 80 mg total   Follow-up: Return in  about 5 weeks (around 11/29/2018) for 5-6 weeks.  Orders Placed This Encounter  Procedures  . Ambulatory referral to Physical Therapy    Signed,  Frederico Hamman T. Brittlyn Cloe, MD   Outpatient Encounter Medications as of 10/25/2018  Medication Sig  . Ascorbic Acid (VITAMIN C) 100 MG CHEW Chew 200 mg by mouth daily as needed (immune system support).  . celecoxib (CELEBREX) 200 MG capsule TAKE 1 CAPSULE BY MOUTH ONCE DAILY AS NEEDED FOR MILD TO MODERATE PAIN  . diclofenac sodium (VOLTAREN) 1 % GEL Apply 4 g topically 4 (four) times daily.  . fluticasone (VERAMYST) 27.5 MCG/SPRAY nasal spray Place 2 sprays into the nose daily as needed for allergies.   . furosemide (LASIX) 20 MG tablet Take 1 tablet (20 mg total) by mouth daily as needed.  Marland Kitchen glimepiride (AMARYL) 1 MG tablet TAKE 1 TABLET EVERY DAY WITH BREAKFAST  . losartan-hydrochlorothiazide (HYZAAR) 100-12.5 MG tablet Take 1 tablet by mouth daily.  . metoprolol succinate (TOPROL-XL) 25 MG 24 hr tablet Take 1 tablet (25 mg total) by mouth daily.  . Multiple Vitamin (MULTIVITAMIN) tablet Take 1 tablet by mouth daily.  . pioglitazone (ACTOS) 45 MG tablet Take 1 tablet (45 mg total) by mouth daily.  . potassium chloride SA (K-DUR,KLOR-CON) 20 MEQ tablet Take 1 tablet (20 mEq total) by mouth daily as needed.  . predniSONE (DELTASONE) 50 MG tablet Take 1 tablet (50 mg total) by mouth daily with breakfast.  . rivaroxaban (XARELTO) 20 MG TABS tablet Take 1 tablet (20 mg total) by mouth daily with supper.   No facility-administered encounter medications on file as of 10/25/2018.

## 2018-10-25 ENCOUNTER — Ambulatory Visit (INDEPENDENT_AMBULATORY_CARE_PROVIDER_SITE_OTHER): Payer: Medicare Other | Admitting: Family Medicine

## 2018-10-25 ENCOUNTER — Encounter: Payer: Self-pay | Admitting: Family Medicine

## 2018-10-25 VITALS — BP 140/80 | HR 84 | Temp 97.4°F | Ht 66.0 in | Wt 212.2 lb

## 2018-10-25 DIAGNOSIS — I8310 Varicose veins of unspecified lower extremity with inflammation: Secondary | ICD-10-CM | POA: Diagnosis not present

## 2018-10-25 DIAGNOSIS — Z85828 Personal history of other malignant neoplasm of skin: Secondary | ICD-10-CM | POA: Diagnosis not present

## 2018-10-25 DIAGNOSIS — M2391 Unspecified internal derangement of right knee: Secondary | ICD-10-CM | POA: Diagnosis not present

## 2018-10-25 DIAGNOSIS — M25561 Pain in right knee: Secondary | ICD-10-CM

## 2018-10-25 DIAGNOSIS — Z1283 Encounter for screening for malignant neoplasm of skin: Secondary | ICD-10-CM | POA: Diagnosis not present

## 2018-10-25 DIAGNOSIS — E1142 Type 2 diabetes mellitus with diabetic polyneuropathy: Secondary | ICD-10-CM

## 2018-10-25 DIAGNOSIS — I480 Paroxysmal atrial fibrillation: Secondary | ICD-10-CM | POA: Diagnosis not present

## 2018-10-25 DIAGNOSIS — D229 Melanocytic nevi, unspecified: Secondary | ICD-10-CM | POA: Diagnosis not present

## 2018-10-25 DIAGNOSIS — L570 Actinic keratosis: Secondary | ICD-10-CM | POA: Diagnosis not present

## 2018-10-25 DIAGNOSIS — L918 Other hypertrophic disorders of the skin: Secondary | ICD-10-CM | POA: Diagnosis not present

## 2018-10-25 DIAGNOSIS — L814 Other melanin hyperpigmentation: Secondary | ICD-10-CM | POA: Diagnosis not present

## 2018-10-25 DIAGNOSIS — L821 Other seborrheic keratosis: Secondary | ICD-10-CM | POA: Diagnosis not present

## 2018-10-25 MED ORDER — METHYLPREDNISOLONE ACETATE 40 MG/ML IJ SUSP
80.0000 mg | Freq: Once | INTRAMUSCULAR | Status: AC
  Administered 2018-10-25: 80 mg via INTRA_ARTICULAR

## 2018-10-26 ENCOUNTER — Encounter: Payer: Self-pay | Admitting: Family Medicine

## 2018-10-26 NOTE — Telephone Encounter (Signed)
Can you help the patient with this.  I am happy to fill out a handicap temporary sticker for you.  Angela Bentley, can you help complete and put in my box and I will sign on Monday.  I think that a basic hinged knee support that you can get at most pharmacies would be enough for support.  You should be able to put those on comfortably.  Honestly, most of the braces that I have are worth 100s of dollars and are more designed for major ligament injuries.

## 2018-10-31 ENCOUNTER — Encounter: Payer: Medicare Other | Admitting: Physical Therapy

## 2018-11-07 ENCOUNTER — Encounter: Payer: Self-pay | Admitting: Physical Therapy

## 2018-11-07 ENCOUNTER — Ambulatory Visit: Payer: Medicare Other | Attending: Internal Medicine | Admitting: Physical Therapy

## 2018-11-07 DIAGNOSIS — R262 Difficulty in walking, not elsewhere classified: Secondary | ICD-10-CM

## 2018-11-07 DIAGNOSIS — R2681 Unsteadiness on feet: Secondary | ICD-10-CM

## 2018-11-07 DIAGNOSIS — M6281 Muscle weakness (generalized): Secondary | ICD-10-CM | POA: Diagnosis not present

## 2018-11-07 DIAGNOSIS — M25561 Pain in right knee: Secondary | ICD-10-CM | POA: Diagnosis not present

## 2018-11-07 DIAGNOSIS — M79672 Pain in left foot: Secondary | ICD-10-CM

## 2018-11-07 DIAGNOSIS — M79671 Pain in right foot: Secondary | ICD-10-CM

## 2018-11-07 NOTE — Therapy (Signed)
[place] MAIN [facility] SERVICES [address] [place], [place], [address] Phone: [phone]   Fax:  [phone]  November 07, 2018   '[email or web address]' @  Physical Therapy Discharge Summary  Patient: Angela Bentley  MRN: [id number]  Date of Birth: 02/28/1941   Diagnosis: Pain in right foot  Pain in left foot  Difficulty in walking, not elsewhere classified  Unsteadiness on feet Referring Provider (PT): Dr. [name]   The above patient had been seen in Physical Therapy 15  times of 21 treatments scheduled with0 no shows and 2 cancellations.  The treatment consisted of manual therapy, exercise, taping The patient is: Improved  Subjective: patient called complaining of right knee pain which was acute and severe. She requested to be discharged from PT until she follows up with orthopedic physician regarding knee pain;   Discharge Findings: improved right and left foot symptoms; still having unsteadiness with gait;     Goals Partially Met    Sincerely,   Trotter,Margaret, PT   CC [email or web address]' @  [place], [place], [address] Phone: [phone]   Fax:  [phone]  Patient: Angela Bentley  MRN: [id number]  Date of Birth: [birth year]

## 2018-11-08 ENCOUNTER — Encounter: Payer: Self-pay | Admitting: Physical Therapy

## 2018-11-08 NOTE — Therapy (Signed)
[place] MAIN [facility] SERVICES [address] [place], [place], [address] Phone: [phone]   Fax:  [phone]  Physical Therapy Evaluation  Patient Details  Name: Angela Bentley MRN: [id number] Date of Birth: 01-Jan-1941 Referring Provider (PT): Dr. Edilia Bo   Encounter Date: 11/07/2018  PT End of Session - 11/08/18 1035    Visit Number  1    Number of Visits  17    Date for PT Re-Evaluation  01/03/19    Authorization Type  start of care 11/07/18    PT Start Time  1432    PT Stop Time  1517    PT Time Calculation (min)  45 min    Activity Tolerance  Patient limited by pain    Behavior During Therapy  [facility] for tasks assessed/performed       Past Medical History:  Diagnosis Date  . Brain tumor ([place])    a. Left intraventricular tumor ->stable by 06/2017 MRI. Followed @ [facility].  . Gait disturbance    a. Unsteady on feet/balance difficulty.  . Generalized osteoarthritis of multiple sites   . GERD (gastroesophageal reflux disease)   . History of DVT (deep vein thrombosis) 2016   estrogen and airflights. Rx with xarelto for 3 months  . History of stress test    a. 11/2016 ETT: No ST/T changes.  Performed to assess PAC/PVC burden with activity ->No ectopy noted.  . Hypertension   . Hyperthyroidism    treated briefly in college  . Insomnia   . Obesity   . PAF (paroxysmal atrial fibrillation) ([place])    a. 12/2016 Event Monitor: brief run of PAF-->CHA2DS2VASc = 5--> Xarelto.  Marland Kitchen Retinal tear of left eye   . Rosacea   . Type 2 diabetes, controlled, with peripheral neuropathy ([place])    a. 11/2016 A1c 7.4.  . Uterine fibroid     Past Surgical History:  Procedure Laterality Date  . CARPAL TUNNEL RELEASE Right    Dr Burney Gauze  . CATARACT EXTRACTION    . RETINAL TEAR REPAIR CRYOTHERAPY     10/09/13, then again 3/15    There were no vitals filed for this visit.   Subjective Assessment - 11/07/18 1443    Subjective  "I feel picked on. My knee  is hurting but I am also having IT band pain all on the right leg."     Pertinent History  78 yo Female with acute right knee pain over last month. She reports pain started with extension exercise and has been exacerbated. She reports having increased pain at night with aching in knee with side sleeping. As a result she is not sleeping much at night. She did follow up with orthopedic MD who did x-rays, negative for fracture; He did do some orthopedic tests which showed possible right meniscus tear; she did get steroid injection in right knee with no relief 1 week later; she did stop taking prednisone per Dr. Edilia Bo (orthopedic); She is using ice intermittently which does help some. She also has various knee braces that she has tried intermittently. She reports that the aching at night is along knee cap and there is more clicking in patella. She reports sharp pain on each side of the patella that are tender. In addition she is having exacerbated right IT band irritation.     Limitations  Walking;Standing    How long can you sit comfortably?  NA    How long can you stand comfortably?  inconsistent pain with weight  bearing    How long can you walk comfortably?  using RW (very little) and cane intermittently; limited to short distances.     Diagnostic tests  x-ray negative for fracture;     Patient Stated Goals  reduce knee pain and improve mobility;     Currently in Pain?  Yes    Pain Score  5     Pain Location  Knee    Pain Orientation  Right    Pain Descriptors / Indicators  Sharp;Other (Comment)   catch/concern for instability   Pain Type  Acute pain    Pain Radiating Towards  anterior knee    Pain Onset  1 to 4 weeks ago    Pain Frequency  Intermittent    Aggravating Factors   worse with transfers/locking the knee    Pain Relieving Factors  rest/ice; using knee intermittently    Effect of Pain on Daily Activities  decreased tolerance with activity    Multiple Pain Sites  No         [facility]  PT Assessment - 11/08/18 0001      Assessment   Medical Diagnosis  Right knee pain    Referring Provider (PT)  Dr. Edilia Bo    Onset Date/Surgical Date  10/20/18    Hand Dominance  Right    Prior Therapy  had PT for right plantar fasciitis      Precautions   Precautions  Fall      Restrictions   Weight Bearing Restrictions  No      Balance Screen   Has the patient fallen in the past 6 months  No    Has the patient had a decrease in activity level because of a fear of falling?   No    Is the patient reluctant to leave their home because of a fear of falling?   No      Home Environment   Additional Comments  lives in single story independent living community with husband, mod I for self care ADLs      Prior Function   Level of Independence  Independent    Vocation  Retired    Leisure  likes to walk dog and socialize      Cognition   Overall Cognitive Status  Within Functional Limits for tasks assessed      Sensation   Light Touch  Appears Intact      Coordination   Gross Motor Movements are Fluid and Coordinated  Yes    Fine Motor Movements are Fluid and Coordinated  Yes      AROM   Right Knee Extension  -10    Right Knee Flexion  125      Strength   Overall Strength Comments  BUE and BLE are WFL with exception of: Hip abduction 4/5, knee: flexion: 4/5, extension: 3-/5 on RLE       Palpation   Patella mobility  slightly hypomobile on RLE as compared to LLE; reports pain with inferior glide on RLE with slight clicking noted;     Palpation comment  moderate tenderness to right knee along joint line medial/lateral patella;       Special Tests    Special Tests  Knee Special Tests      McConnell Test   Findings  Positive    Side  Right   for pain     other    Findings  Positive    Side   Right    Comments  Thessaly test positive on right for pain, but no instability noted;       Transfers   Comments  transfers sit<>stand with RLE advanced due to pain; requires  2 HHA to push off chair;       Ambulation/Gait   Gait Comments  ambulates with wider base of support with RW with antalgic gait pattern with short step length on left and decreased stance on RLE; Patient has significant antalgic gait on right; Exhibits foot pronation on right side especially with weight bearing;       High Level Balance   High Level Balance Comments  static standing balance is fair, dynamic standing balance is fair as patient has to use RW/cane due to antalgic gait pattern                Objective measurements completed on examination: See above findings.    Will address HEP next visit          PT Education - 11/08/18 1035    Education Details  Recommendation/Plan of care    Person(s) Educated  Patient    Methods  Explanation;Verbal cues    Comprehension  Verbalized understanding;Returned demonstration;Verbal cues required;Need further instruction       PT Short Term Goals - 11/08/18 1040      PT SHORT TERM GOAL #1   Title  Patient will be adherent to HEP at least 3x a week to improve functional strength and balance for better safety at home.    Time  4    Period  Weeks    Status  New    Target Date  12/06/18      PT SHORT TERM GOAL #2   Title  Patient will increase RLE knee AROM extension to within 5 degrees of neutral for better standing and walking tolerance;     Time  4    Period  Weeks    Status  New    Target Date  12/06/18      PT SHORT TERM GOAL #3   Title  Patient will reports maximum of 1 sleep disturbances per night over last 3 days to exhibit improved sleeping tolerance with less knee pain and reduced fatigue.     Time  4    Period  Weeks    Status  New    Target Date  12/06/18        PT Long Term Goals - 11/08/18 1042      PT LONG TERM GOAL #1   Title  Patient will increase BLE gross strength to 4+/5 as to improve functional strength for independent gait, increased standing tolerance and increased ADL ability.    Time   8    Period  Weeks    Status  New    Target Date  01/03/19      PT LONG TERM GOAL #2   Title  Patient will increase lower extremity functional scale to >60/80 to demonstrate improved functional mobility and increased tolerance with ADLs.     Time  8    Period  Weeks    Status  New    Target Date  01/03/19      PT LONG TERM GOAL #3   Title  Patient will report a worst pain of 3/10 on VAS in      right knee       to improve tolerance with ADLs and reduced symptoms with activities.     Time  8  Period  Weeks    Status  New    Target Date  01/03/19             Plan - 11/08/18 1035    Clinical Impression Statement  78 yo Female reports increased right knee pain, acute insideous onset. She reports having more pain with terminal knee extension. She woke up one day and was unable to put weight on her right knee. She went to ED and x-rays were negative for fracture. Patient did see orthopedic MD who suspect possible right meniscus tear either medial/lateral as patient has pain bilateral patella. In addition., she does exhibit heavy pronation on right foot with patella pain and lateral IT Band pain in RLE which could all be related to excessive foot pronation. She does exhibit mild lateral tracking of right patella but pain is not relieved with medial Mcconnell taping. Patient does report pain relief with ice and rest. Patient would benefit from additional skilled PT Intervention to improve strength, balance and reduce right knee pain. Recommend referral to orthotist for custom orthotics as well as conservative knee stabilization exercise to reduce knee pain.     History and Personal Factors relevant to plan of care:  lives with husband in single story home, has small dog, co-morbidities including diabetes with history of imbalance; no recent falls;     Clinical Presentation  Evolving    Clinical Presentation due to:  recent acute pain which is moderate to severe requiring AD for ambulation;      Clinical Decision Making  Moderate    Rehab Potential  Good    Clinical Impairments Affecting Rehab Potential  motivated, good PLOF and good caregiver support    PT Frequency  2x / week    PT Duration  8 weeks    PT Treatment/Interventions  Aquatic Therapy;Cryotherapy;Electrical Stimulation;Moist Heat;Ultrasound;Gait training;Stair training;Functional mobility training;Therapeutic activities;Therapeutic exercise;Balance training;Neuromuscular re-education;Patient/family education;Orthotic Fit/Training;Manual techniques;Passive range of motion;Energy conservation;Taping    PT Next Visit Plan  initiate knee HEP    PT Home Exercise Plan  address next visit    Consulted and Agree with Plan of Care  Patient       Patient will benefit from skilled therapeutic intervention in order to improve the following deficits and impairments:  Abnormal gait, Decreased activity tolerance, Pain, Difficulty walking, Decreased mobility, Decreased balance, Decreased range of motion, Impaired flexibility, Hypomobility, Decreased strength  Visit Diagnosis: Acute pain of right knee  Difficulty in walking, not elsewhere classified  Muscle weakness (generalized)     Problem List Patient Active Problem List   Diagnosis Date Noted  . Tinnitus, bilateral 11/28/2017  . Abnormal MRI of head 11/14/2017  . Meningioma ([place]) 09/21/2017  . Ataxia 05/11/2017  . PAF (paroxysmal atrial fibrillation) ([place]) 02/03/2017  . Preventative health care 06/02/2016  . Advance directive discussed with patient 06/02/2016  . Achilles tendonitis 03/11/2016  . Hypertension   . DVT, lower extremity, recurrent ([place])   . Type 2 diabetes, controlled, with peripheral neuropathy ([place])   . Generalized osteoarthritis of multiple sites   . GERD (gastroesophageal reflux disease)   . Hyperthyroidism   . Retinal hole of right eye 06/26/2014  . Amblyopia, right 05/08/2014  . Status post intraocular lens implant 05/08/2014     Arieh Bogue PT, DPT 11/08/2018, 10:43 AM  [place] MAIN [facility] SERVICES [address] [place], [place], [address] Phone: [phone]   Fax:  [phone]  Name: Brenda Samano MRN: [id number] Date of Birth: [birth year]

## 2018-11-12 ENCOUNTER — Ambulatory Visit: Payer: Medicare Other | Admitting: Physical Therapy

## 2018-11-12 ENCOUNTER — Encounter: Payer: Medicare Other | Admitting: Physical Therapy

## 2018-11-12 DIAGNOSIS — M6281 Muscle weakness (generalized): Secondary | ICD-10-CM | POA: Diagnosis not present

## 2018-11-12 DIAGNOSIS — R262 Difficulty in walking, not elsewhere classified: Secondary | ICD-10-CM | POA: Diagnosis not present

## 2018-11-12 DIAGNOSIS — R2681 Unsteadiness on feet: Secondary | ICD-10-CM

## 2018-11-12 DIAGNOSIS — M25561 Pain in right knee: Secondary | ICD-10-CM | POA: Diagnosis not present

## 2018-11-12 NOTE — Therapy (Signed)
[place] MAIN [facility] SERVICES [place], [place], [address] Phone: [phone]   Fax:  [phone]  Physical Therapy Treatment  Patient Details  Name: Angela Bentley MRN: [id number] Date of Birth: [birth year] Referring Provider (PT): Dr. Edilia Bo   Encounter Date: 11/12/2018  PT End of Session - 11/12/18 0900    Visit Number  2    Number of Visits  17    Date for PT Re-Evaluation  01/03/19    Authorization Type  start of care 11/07/18    PT Start Time  0850    PT Stop Time  0929    PT Time Calculation (min)  39 min    Activity Tolerance  Patient limited by pain    Behavior During Therapy  [facility] for tasks assessed/performed       Past Medical History:  Diagnosis Date  . Brain tumor ([place])    a. Left intraventricular tumor ->stable by 06/2017 MRI. Followed @ [facility].  . Gait disturbance    a. Unsteady on feet/balance difficulty.  . Generalized osteoarthritis of multiple sites   . GERD (gastroesophageal reflux disease)   . History of DVT (deep vein thrombosis) 2016   estrogen and airflights. Rx with xarelto for 3 months  . History of stress test    a. 11/2016 ETT: No ST/T changes.  Performed to assess PAC/PVC burden with activity ->No ectopy noted.  . Hypertension   . Hyperthyroidism    treated briefly in college  . Insomnia   . Obesity   . PAF (paroxysmal atrial fibrillation) ([place])    a. 12/2016 Event Monitor: brief run of PAF-->CHA2DS2VASc = 5--> Xarelto.  Marland Kitchen Retinal tear of left eye   . Rosacea   . Type 2 diabetes, controlled, with peripheral neuropathy ([place])    a. 11/2016 A1c 7.4.  . Uterine fibroid     Past Surgical History:  Procedure Laterality Date  . CARPAL TUNNEL RELEASE Right    Dr Burney Gauze  . CATARACT EXTRACTION    . RETINAL TEAR REPAIR CRYOTHERAPY     10/09/13, then again 3/15    There were no vitals filed for this visit.  Subjective Assessment - 11/12/18 0855    Subjective  Pt reports her RIght knee pain  remains fairly aggravated, which is most liiting in attempting side sleeping at night, during which throbbing becomes the worst. She is disappointed that her pain continues to persist in spite of being 4 weeks out.     Pertinent History  78 yo Female with acute right knee pain over last month. She reports pain started with extension exercise and has been exacerbated. She reports having increased pain at night with aching in knee with side sleeping. As a result she is not sleeping much at night. She did follow up with orthopedic MD who did x-rays, negative for fracture; He did do some orthopedic tests which showed possible right meniscus tear; she did get steroid injection in right knee with no relief 1 week later; she did stop taking prednisone per Dr. Edilia Bo (orthopedic); She is using ice intermittently which does help some. She also has various knee braces that she has tried intermittently. She reports that the aching at night is along knee cap and there is more clicking in patella. She reports sharp pain on each side of the patella that are tender. In addition she is having exacerbated right IT band irritation.     Currently in Pain?  Yes    Pain  Score  6     Pain Location  --   Right knee, medial and lateral, sharp and throbbing        Activities this dated:  -Seated heel slides in chair x3 minutes -supine hele slides 20x 5secH -Supine Rt quad set, 15x3secH -Supine Rt knee SAQ 2x10 -hooklying bridge 1x10 (pt reports worse symptoms after performance, discomfort attaining position with knee flexion) -Seated Rt knee distraction x3 minutes with 5lb ankle cuff weight  -MFR to Rt vastus lateralis x 3minutes, several painful points, more proximal than distal.          PT Short Term Goals - 11/08/18 1040      PT SHORT TERM GOAL #1   Title  Patient will be adherent to HEP at least 3x a week to improve functional strength and balance for better safety at home.    Time  4    Period  Weeks     Status  New    Target Date  12/06/18      PT SHORT TERM GOAL #2   Title  Patient will increase RLE knee AROM extension to within 5 degrees of neutral for better standing and walking tolerance;     Time  4    Period  Weeks    Status  New    Target Date  12/06/18      PT SHORT TERM GOAL #3   Title  Patient will reports maximum of 1 sleep disturbances per night over last 3 days to exhibit improved sleeping tolerance with less knee pain and reduced fatigue.     Time  4    Period  Weeks    Status  New    Target Date  12/06/18        PT Long Term Goals - 11/08/18 1042      PT LONG TERM GOAL #1   Title  Patient will increase BLE gross strength to 4+/5 as to improve functional strength for independent gait, increased standing tolerance and increased ADL ability.    Time  8    Period  Weeks    Status  New    Target Date  01/03/19      PT LONG TERM GOAL #2   Title  Patient will increase lower extremity functional scale to >60/80 to demonstrate improved functional mobility and increased tolerance with ADLs.     Time  8    Period  Weeks    Status  New    Target Date  01/03/19      PT LONG TERM GOAL #3   Title  Patient will report a worst pain of 3/10 on VAS in      right knee       to improve tolerance with ADLs and reduced symptoms with activities.     Time  8    Period  Weeks    Status  New    Target Date  01/03/19            Plan - 11/12/18 0901    Clinical Impression Statement  Worked on establishing a home based program to address swelling and pain. Also iniitated some myofascial work to address trigger points throughout Rt vastus lateralis.  Pain remains fairly limiting in weight bearing actiivty, pt largely unable to resume normal activity. Pt able to perform all activity without exacerbation of symptoms or pain. Pt progressing well overall, encouraged ot conitnue to ice at home as she sees it benefitial for pain  management. Also encouraged to use a pillow betwenlegs  if side sleeping persists.      Rehab Potential  Good    Clinical Impairments Affecting Rehab Potential  motivated, good PLOF and good caregiver support    PT Frequency  2x / week    PT Duration  8 weeks    PT Treatment/Interventions  Aquatic Therapy;Cryotherapy;Electrical Stimulation;Moist Heat;Ultrasound;Gait training;Stair training;Functional mobility training;Therapeutic activities;Therapeutic exercise;Balance training;Neuromuscular re-education;Patient/family education;Orthotic Fit/Training;Manual techniques;Passive range of motion;Energy conservation;Taping    PT Next Visit Plan  Revisit HEP assure proper performance and no symptoms aggravation     PT Home Exercise Plan  Seated and supine heel slides, quads sets, SAQ    Consulted and Agree with Plan of Care  Patient       Patient will benefit from skilled therapeutic intervention in order to improve the following deficits and impairments:  Abnormal gait, Decreased activity tolerance, Pain, Difficulty walking, Decreased mobility, Decreased balance, Decreased range of motion, Impaired flexibility, Hypomobility, Decreased strength  Visit Diagnosis: Acute pain of right knee  Muscle weakness (generalized)  Unsteadiness on feet     Problem List Patient Active Problem List   Diagnosis Date Noted  . Tinnitus, bilateral 11/28/2017  . Abnormal MRI of head 11/14/2017  . Meningioma ([place]) 09/21/2017  . Ataxia 05/11/2017  . PAF (paroxysmal atrial fibrillation) ([place]) 02/03/2017  . Preventative health care 06/02/2016  . Advance directive discussed with patient 06/02/2016  . Achilles tendonitis 03/11/2016  . Hypertension   . DVT, lower extremity, recurrent ([place])   . Type 2 diabetes, controlled, with peripheral neuropathy ([place])   . Generalized osteoarthritis of multiple sites   . GERD (gastroesophageal reflux disease)   . Hyperthyroidism   . Retinal hole of right eye 06/26/2014  . Amblyopia, right 05/08/2014  . Status post intraocular  lens implant 05/08/2014   9:42 AM, 11/12/18 Etta Grandchild, PT, DPT Physical Therapist - [place] [phone]     Etta Grandchild 11/12/2018, 9:28 AM  [place] MAIN [facility] SERVICES [address] [place], [place], [address] Phone: [phone]   Fax:  [phone]  Name: Angela Bentley MRN: [id number] Date of Birth: 12-04-1940

## 2018-11-14 ENCOUNTER — Ambulatory Visit: Payer: Medicare Other | Admitting: Physical Therapy

## 2018-11-14 ENCOUNTER — Telehealth: Payer: Self-pay | Admitting: *Deleted

## 2018-11-14 ENCOUNTER — Other Ambulatory Visit: Payer: Self-pay | Admitting: Family Medicine

## 2018-11-14 MED ORDER — TRAZODONE HCL 50 MG PO TABS: 50.0000 mg | ORAL_TABLET | Freq: Every evening | ORAL | 0 refills | Status: DC | PRN

## 2018-11-14 NOTE — Telephone Encounter (Signed)
Appointment made 2/24 @ 9 Pt wanted to know if she could get something to help her sleep till she can come in on Monday.  The pain is bad and she is having trouble sleeping.  [facility]

## 2018-11-14 NOTE — Telephone Encounter (Signed)
Make her an appointment for Monday please. I am completely full today. Non-urgent multi-month knee pain.

## 2018-11-14 NOTE — Telephone Encounter (Signed)
I sent her in some trazodone

## 2018-11-14 NOTE — Telephone Encounter (Signed)
Left message for Mrs. Rys that Dr. Lorelei Pont has sent her in a prescription for Trazodone 50 mg to take one tablet at bedtime to help with sleep.

## 2018-11-14 NOTE — Telephone Encounter (Signed)
Patient called stating that she saw Dr. Lorelei Pont 3 weeks ago with knee pain. Patient stated that her knee has not gotten any better, but worse. Patient stated that she is having so much pain when she goes to bed she can not sleep. Patient stated that she has had some physical therapy, but cancelled her appointment today because of the pain. Patient stated that she was to follow-up with Dr. Lorelei Pont in six weeks, but wants to see him as soon as possible. Patient thinks that she may need to go ahead and get a MRI. Patient stated that the Voltaren gel is not helping her pain. Patient stated that she took extra strength lactophenin last night and it run her blood sugar up Pharmacy[facility]

## 2018-11-14 NOTE — Progress Notes (Unsigned)
rx

## 2018-11-17 NOTE — Progress Notes (Signed)
Dr. Frederico Hamman T. Nazanin Kinner, MD, [facility] [place], [address] Phone: [phone] Fax: [phone]  11/19/2018  Patient: Angela Bentley, MRN: [id number], DOB: 01/11/1941, 78 y.o.  Primary Physician:  [name], MD   Chief Complaint  Patient presents with  . Knee Pain    Right   Subjective:   Angela Bentley is an 78 y.o. very pleasant female patient who presents with the following:  78 year old patient of Dr. [name] she is a pleasant lady who has atrial fibrillation and is on Xarelto chronically.  I saw her initially on October 25, 2018.  At that point she had been having some knee pain over the last few weeks.  She developed some relatively significant knee pain, had some difficulty walking, she ultimately went to the ER.  When I saw her, she was using a walker, and we did an intra-articular injection, and I had her start to do some physical therapy formally.  She is contacted me a number of times, and at the end of last week she was having significant pain with therapy and wanted to be reevaluated.  Initial nonweightbearing trauma series of the right knee had no significant osteophytosis or subchondral sclerosis.  In the emergency room she was given 5 days of prednisone 50 mg.  She is here today for additional recommendations.  Having some shooting pains down her legs. Having a hard time falling asleep.  When turns on her, then will have a lot of pain on either side. 4 hours of sleep average only.   Past Medical History, Surgical History, Social History, Family History, Problem List, Medications, and Allergies have been reviewed and updated if relevant.  Patient Active Problem List   Diagnosis Date Noted  . Tinnitus, bilateral 11/28/2017  . Abnormal MRI of head 11/14/2017  . Meningioma ([place]) 09/21/2017  . Ataxia 05/11/2017  . PAF (paroxysmal atrial fibrillation) ([place]) 02/03/2017  . Preventative health care  06/02/2016  . Advance directive discussed with patient 06/02/2016  . Achilles tendonitis 03/11/2016  . Hypertension   . DVT, lower extremity, recurrent ([place])   . Type 2 diabetes, controlled, with peripheral neuropathy ([place])   . Generalized osteoarthritis of multiple sites   . GERD (gastroesophageal reflux disease)   . Hyperthyroidism   . Retinal hole of right eye 06/26/2014  . Amblyopia, right 05/08/2014  . Status post intraocular lens implant 05/08/2014    Past Medical History:  Diagnosis Date  . Brain tumor ([place])    a. Left intraventricular tumor ->stable by 06/2017 MRI. Followed @ [facility].  . Gait disturbance    a. Unsteady on feet/balance difficulty.  . Generalized osteoarthritis of multiple sites   . GERD (gastroesophageal reflux disease)   . History of DVT (deep vein thrombosis) 2016   estrogen and airflights. Rx with xarelto for 3 months  . History of stress test    a. 11/2016 ETT: No ST/T changes.  Performed to assess PAC/PVC burden with activity ->No ectopy noted.  . Hypertension   . Hyperthyroidism    treated briefly in college  . Insomnia   . Obesity   . PAF (paroxysmal atrial fibrillation) ([place])    a. 12/2016 Event Monitor: brief run of PAF-->CHA2DS2VASc = 5--> Xarelto.  Marland Kitchen Retinal tear of left eye   . Rosacea   . Type 2 diabetes, controlled, with peripheral neuropathy ([place])    a. 11/2016 A1c 7.4.  . Uterine fibroid  Past Surgical History:  Procedure Laterality Date  . CARPAL TUNNEL RELEASE Right    Dr Burney Gauze  . CATARACT EXTRACTION    . RETINAL TEAR REPAIR CRYOTHERAPY     10/09/13, then again 3/15    Social History   Socioeconomic History  . Marital status: Married    Spouse name: Not on file  . Number of children: 3  . Years of education: Not on file  . Highest education level: Not on file  Occupational History  . Occupation: Non Patent attorney    Comment: [employer] and others  Social Needs  . Financial resource strain: Not on file  . Food  insecurity:    Worry: Not on file    Inability: Not on file  . Transportation needs:    Medical: Not on file    Non-medical: Not on file  Tobacco Use  . Smoking status: Former Smoker    Last attempt to quit: 09/26/1993    Years since quitting: 25.1  . Smokeless tobacco: Never Used  . Tobacco comment: quit in 1996  Substance and Sexual Activity  . Alcohol use: Yes    Alcohol/week: 1.0 standard drinks    Types: 1 Standard drinks or equivalent per week    Comment: occasional wine  . Drug use: No  . Sexual activity: Never    Partners: Male    Comment: husband vasectomy  Lifestyle  . Physical activity:    Days per week: Not on file    Minutes per session: Not on file  . Stress: Not on file  Relationships  . Social connections:    Talks on phone: Not on file    Gets together: Not on file    Attends religious service: Not on file    Active member of club or organization: Not on file    Attends meetings of clubs or organizations: Not on file    Relationship status: Not on file  . Intimate partner violence:    Fear of current or ex partner: Not on file    Emotionally abused: Not on file    Physically abused: Not on file    Forced sexual activity: Not on file  Other Topics Concern  . Not on file  Social History Narrative   Has living will   Husband is health care POA-- or daughter Izora Gala   Would accept resuscitation attempts   Would not want tube feeds if cognitively unaware    Family History  Problem Relation Age of Onset  . Diabetes Father   . Pancreatic cancer Father   . Diabetes Other        grandmother    Allergies  Allergen Reactions  . Penicillins     Has patient had a PCN reaction causing immediate rash, facial/tongue/throat swelling, SOB or lightheadedness with hypotension: Yes Has patient had a PCN reaction causing severe rash involving mucus membranes or skin necrosis: Yes Has patient had a PCN reaction that required hospitalization No Has patient had a PCN  reaction occurring within the last 10 years: No If all of the above answers are "NO", then may proceed with Cephalosporin use.    . Sulfa Antibiotics     Rash/itching  . Azithromycin Palpitations    Medication list reviewed and updated in full in [facility].  GEN: No fevers, chills. Nontoxic. Primarily MSK c/o today. MSK: Detailed in the HPI GI: tolerating PO intake without difficulty Neuro: No numbness, parasthesias, or tingling associated. Otherwise the pertinent positives of the  ROS are noted above.   Objective:   BP 120/60   Pulse 88   Temp 97.9 F (36.6 C) (Oral)   Ht 5\' 6"  (1.676 m)   Wt 210 lb 8 oz (95.5 kg)   LMP 09/26/1997   BMI 33.98 kg/m    GEN: WDWN, NAD, Non-toxic, Alert & Oriented x 3 HEENT: Atraumatic, Normocephalic.  Ears and Nose: No external deformity. EXTR: No clubbing/cyanosis/edema NEURO: Normal gait. antalgia PSYCH: Normally interactive. Conversant. Not depressed or anxious appearing.  Calm demeanor.   Knee:  R Gait: Normal heel toe pattern ROM: 0-115 Effusion: minimal Echymosis or edema: none Patellar tendon NT Painful PLICA: neg Patellar grind: negative Medial and lateral patellar facet loading: negative medial and lateral joint lines: medial joint line tenderness Mcmurray's pos Flexion-pinch pos Varus and valgus stress: stable Lachman: neg Ant and Post drawer: neg Hip abduction, IR, ER: WNL Hip flexion str: 5/5 Hip abd: 5/5 Quad: 5/5 VMO atrophy:No Hamstring concentric and eccentric: 5/5   Radiology: Dg Knee Complete 4 Views Right  Result Date: 10/20/2018 CLINICAL DATA:  Right knee pain and limited range of motion for 3 weeks. No known injury. EXAM: RIGHT KNEE - COMPLETE 4+ VIEW COMPARISON:  None. FINDINGS: No evidence of fracture, dislocation, or joint effusion. No evidence of arthropathy or other focal bone abnormality. Soft tissues are unremarkable. IMPRESSION: Negative. Electronically Signed   By: Earle Gell M.D.   On:  10/20/2018 17:52    Assessment and Plan:   Internal derangement of knee, acute, right  Acute pain of right knee - Plan: MR Knee Right Wo Contrast  PAF (paroxysmal atrial fibrillation) (HCC)  >25 minutes spent in face to face time with patient, >50% spent in counselling or coordination of care: extensive conversation regarding options, MRI's, knee anatomy, pathology, treatment, POC.  Doing poorly with significant functional impairment x 6 weeks with failure of oral steroids, knee injection, formal PT, HEP, now using walker, medial joint line tenderness with + mcmurrays, + flexion pinch and mechanical symptoms.  Obtain an MRI of the knee without contrast to evaluate for potential meniscal tear and cannot exclude occult tibial plateau insufficiency fracture in a 78 year old female.   Follow-up: depends on MRI findings.   Meds ordered this encounter  Medications  . diazepam (VALIUM) 2 MG tablet    Sig: Take 1 tablet 45 minutes before MRI, repeat if needed    Dispense:  2 tablet    Refill:  0   Orders Placed This Encounter  Procedures  . MR Knee Right Wo Contrast    Signed,  Amea Mcphail T. Naziah Portee, MD   Outpatient Encounter Medications as of 11/19/2018  Medication Sig  . Ascorbic Acid (VITAMIN C) 100 MG CHEW Chew 200 mg by mouth daily as needed (immune system support).  . celecoxib (CELEBREX) 200 MG capsule TAKE 1 CAPSULE BY MOUTH ONCE DAILY AS NEEDED FOR MILD TO MODERATE PAIN  . diclofenac sodium (VOLTAREN) 1 % GEL Apply 4 g topically 4 (four) times daily.  . fluticasone (VERAMYST) 27.5 MCG/SPRAY nasal spray Place 2 sprays into the nose daily as needed for allergies.   . furosemide (LASIX) 20 MG tablet Take 1 tablet (20 mg total) by mouth daily as needed.  Marland Kitchen glimepiride (AMARYL) 1 MG tablet TAKE 1 TABLET EVERY DAY WITH BREAKFAST  . losartan-hydrochlorothiazide (HYZAAR) 100-12.5 MG tablet Take 1 tablet by mouth daily.  . metoprolol succinate (TOPROL-XL) 25 MG 24 hr tablet Take 1  tablet (25 mg total) by mouth  daily.  . Multiple Vitamin (MULTIVITAMIN) tablet Take 1 tablet by mouth daily.  . pioglitazone (ACTOS) 45 MG tablet Take 1 tablet (45 mg total) by mouth daily.  . potassium chloride SA (K-DUR,KLOR-CON) 20 MEQ tablet Take 1 tablet (20 mEq total) by mouth daily as needed.  . predniSONE (DELTASONE) 50 MG tablet Take 1 tablet (50 mg total) by mouth daily with breakfast.  . rivaroxaban (XARELTO) 20 MG TABS tablet Take 1 tablet (20 mg total) by mouth daily with supper.  . diazepam (VALIUM) 2 MG tablet Take 1 tablet 45 minutes before MRI, repeat if needed  . [DISCONTINUED] traZODone (DESYREL) 50 MG tablet Take 1 tablet (50 mg total) by mouth at bedtime as needed for sleep.   No facility-administered encounter medications on file as of 11/19/2018.

## 2018-11-19 ENCOUNTER — Ambulatory Visit (INDEPENDENT_AMBULATORY_CARE_PROVIDER_SITE_OTHER): Payer: Medicare Other | Admitting: Family Medicine

## 2018-11-19 ENCOUNTER — Encounter: Payer: Self-pay | Admitting: Family Medicine

## 2018-11-19 ENCOUNTER — Encounter: Payer: Medicare Other | Admitting: Physical Therapy

## 2018-11-19 VITALS — BP 120/60 | HR 88 | Temp 97.9°F | Ht 66.0 in | Wt 210.5 lb

## 2018-11-19 DIAGNOSIS — M25561 Pain in right knee: Secondary | ICD-10-CM

## 2018-11-19 DIAGNOSIS — M2391 Unspecified internal derangement of right knee: Secondary | ICD-10-CM | POA: Diagnosis not present

## 2018-11-19 DIAGNOSIS — I480 Paroxysmal atrial fibrillation: Secondary | ICD-10-CM | POA: Diagnosis not present

## 2018-11-19 MED ORDER — DIAZEPAM 2 MG PO TABS: ORAL_TABLET | ORAL | 0 refills | Status: DC

## 2018-11-19 NOTE — Patient Instructions (Signed)
REFERRALS TO SPECIALISTS, SPECIAL TESTS (MRI, CT, ULTRASOUNDS)  [facility] or  Anastasi[facility] will help you. ASK CHECK-IN FOR HELP.  Specialist appointment times vary a great deal, based on their schedule / openings. -- Some specialists have very long wait times. (Example. Dermatology)    

## 2018-11-21 ENCOUNTER — Ambulatory Visit: Payer: Medicare Other | Admitting: Physical Therapy

## 2018-11-26 ENCOUNTER — Ambulatory Visit: Payer: Medicare Other | Admitting: Physical Therapy

## 2018-11-27 ENCOUNTER — Ambulatory Visit
Admission: RE | Admit: 2018-11-27 | Discharge: 2018-11-27 | Disposition: A | Discharge status: Home / Self Care | Payer: Medicare Other | Source: Ambulatory Visit | Attending: Family Medicine | Admitting: Family Medicine

## 2018-11-27 ENCOUNTER — Other Ambulatory Visit: Payer: Self-pay

## 2018-11-27 DIAGNOSIS — M25561 Pain in right knee: Secondary | ICD-10-CM | POA: Diagnosis not present

## 2018-11-28 ENCOUNTER — Ambulatory Visit: Payer: Medicare Other | Admitting: Physical Therapy

## 2018-11-28 ENCOUNTER — Other Ambulatory Visit: Payer: Self-pay | Admitting: Family Medicine

## 2018-11-28 ENCOUNTER — Encounter: Payer: Self-pay | Admitting: Family Medicine

## 2018-11-28 DIAGNOSIS — S83281D Other tear of lateral meniscus, current injury, right knee, subsequent encounter: Secondary | ICD-10-CM

## 2018-11-28 DIAGNOSIS — M25561 Pain in right knee: Secondary | ICD-10-CM

## 2018-11-28 NOTE — Progress Notes (Signed)
Consult Alphon[facility] Severance

## 2018-11-30 ENCOUNTER — Ambulatory Visit (INDEPENDENT_AMBULATORY_CARE_PROVIDER_SITE_OTHER): Payer: Medicare Other | Admitting: Orthopedic Surgery

## 2018-11-30 ENCOUNTER — Encounter (INDEPENDENT_AMBULATORY_CARE_PROVIDER_SITE_OTHER): Payer: Self-pay | Admitting: Orthopedic Surgery

## 2018-11-30 DIAGNOSIS — S838X1A Sprain of other specified parts of right knee, initial encounter: Secondary | ICD-10-CM

## 2018-11-30 MED ORDER — LIDOCAINE HCL 1 % IJ SOLN
5.0000 mL | INTRAMUSCULAR | Status: AC | PRN
  Administered 2018-11-30: 5 mL

## 2018-11-30 MED ORDER — BUPIVACAINE HCL 0.25 % IJ SOLN
4.0000 mL | INTRAMUSCULAR | Status: AC | PRN
  Administered 2018-11-30: 4 mL via INTRA_ARTICULAR

## 2018-11-30 MED ORDER — GABAPENTIN 100 MG PO CAPS: ORAL_CAPSULE | ORAL | 0 refills | Status: DC

## 2018-11-30 MED ORDER — METHYLPREDNISOLONE ACETATE 40 MG/ML IJ SUSP
40.0000 mg | INTRAMUSCULAR | Status: AC | PRN
  Administered 2018-11-30: 40 mg via INTRA_ARTICULAR

## 2018-11-30 NOTE — Progress Notes (Signed)
Office Visit Note   Patient: Angela Bentley           Date of Birth: March 02, 1941           MRN: [id number] Visit Date: 11/30/2018 Requested by: Angela Loffler, MD [place], [place] [address] PCP: [name], MD  Subjective: Chief Complaint  Patient presents with  . Right Knee - Pain    HPI: Angela Bentley is a patient with right knee pain.  She had left knee injury about 10 years ago and did well with an injection.  That was a meniscal tear.  She is having right knee pain but she also describes pain which is radiating up the side of her leg up into her trochanteric region as well as radiating down into the tibial region.  Is been going on for 6 weeks.  She has been having some therapy for other issues.  She is not having any discrete back pain.  She has been in therapy for plantar fasciitis.  She is able to sleep but it is difficult for her to get to sleep and this pain will occasionally wake her from sleep at night.  She has been taking Tylenol as well as topical diclofenac.  She is on Xarelto for atrial fibrillation.              ROS: All systems reviewed are negative as they relate to the chief complaint within the history of present illness.  Patient denies  fevers or chills.   Assessment & Plan: Visit Diagnoses:  1. Injury of meniscus of right knee, initial encounter     Plan: Impression is right knee pain with fairly unimpressive lateral meniscal tear which would be a very common finding in patients in this age group.  Some of her symptoms are really more iliotibial band related in terms of involving her leg radiating up to the trochanteric region and down below the knee.  Also this could be referred pain from the back such as a far lateral disc that is not giving her any discrete back pain per se.  We will inject the knee again today.  Try her on some Neurontin at night low-dose and then if she is not any better in 3 weeks I would recommend that she have her back  worked up.  Physical therapy for iliotibial band bursitis also prescribed.  Follow-Up Instructions: Return if symptoms worsen or fail to improve.   Orders:  No orders of the defined types were placed in this encounter.  Meds ordered this encounter  Medications  . gabapentin (NEURONTIN) 100 MG capsule    Sig: 1 po q hs x 10 days then 2 po q hs x 10 days    Dispense:  45 capsule    Refill:  0      Procedures: Large Joint Inj: R knee on 11/30/2018 10:08 AM Indications: diagnostic evaluation, joint swelling and pain Details: 18 G 1.5 in needle, superolateral approach  Arthrogram: No  Medications: 5 mL lidocaine 1 %; 40 mg methylPREDNISolone acetate 40 MG/ML; 4 mL bupivacaine 0.25 % Outcome: tolerated well, no immediate complications Procedure, treatment alternatives, risks and benefits explained, specific risks discussed. Consent was given by the patient. Immediately prior to procedure a time out was called to verify the correct patient, procedure, equipment, support staff and site/side marked as required. Patient was prepped and draped in the usual sterile fashion.       Clinical Data: No additional findings.  Objective:  Vital Signs: LMP 09/26/1997   Physical Exam:  Constitutional: Patient appears well-developed HEENT:  Head: Normocephalic Eyes:EOM are normal Neck: Normal range of motion Cardiovascular: Normal rate Pulmonary/chest: Effort normal Neurologic: Patient is alert Skin: Skin is warm Psychiatric: Patient has normal mood and affect    Ortho Exam: Ortho exam demonstrates good cervical spine range of motion.  She has fairly minimal trochanteric tenderness.  None to trace effusion in the right knee.  She does not have really much in the way of focal lateral joint line tenderness but she has more diffuse medial and lateral joint line tenderness.  Collateral cruciate ligaments are stable on the right-hand side.  Pedal pulses palpable.  No real definite tenderness  over the iliotibial band friction site over the lateral condyle.  No definite paresthesias L1 S1 bilaterally.  Specialty Comments:  No specialty comments available.  Imaging: No results found.   PMFS History: Patient Active Problem List   Diagnosis Date Noted  . Tinnitus, bilateral 11/28/2017  . Abnormal MRI of head 11/14/2017  . Meningioma ([place]) 09/21/2017  . Ataxia 05/11/2017  . PAF (paroxysmal atrial fibrillation) ([place]) 02/03/2017  . Preventative health care 06/02/2016  . Advance directive discussed with patient 06/02/2016  . Achilles tendonitis 03/11/2016  . Hypertension   . DVT, lower extremity, recurrent ([place])   . Type 2 diabetes, controlled, with peripheral neuropathy ([place])   . Generalized osteoarthritis of multiple sites   . GERD (gastroesophageal reflux disease)   . Hyperthyroidism   . Retinal hole of right eye 06/26/2014  . Amblyopia, right 05/08/2014  . Status post intraocular lens implant 05/08/2014   Past Medical History:  Diagnosis Date  . Brain tumor ([place])    a. Left intraventricular tumor ->stable by 06/2017 MRI. Followed @ [facility].  . Gait disturbance    a. Unsteady on feet/balance difficulty.  . Generalized osteoarthritis of multiple sites   . GERD (gastroesophageal reflux disease)   . History of DVT (deep vein thrombosis) 2016   estrogen and airflights. Rx with xarelto for 3 months  . History of stress test    a. 11/2016 ETT: No ST/T changes.  Performed to assess PAC/PVC burden with activity ->No ectopy noted.  . Hypertension   . Hyperthyroidism    treated briefly in college  . Insomnia   . Obesity   . PAF (paroxysmal atrial fibrillation) ([place])    a. 12/2016 Event Monitor: brief run of PAF-->CHA2DS2VASc = 5--> Xarelto.  Marland Kitchen Retinal tear of left eye   . Rosacea   . Type 2 diabetes, controlled, with peripheral neuropathy ([place])    a. 11/2016 A1c 7.4.  . Uterine fibroid     Family History  Problem Relation Age of Onset  . Diabetes Father   . Pancreatic  cancer Father   . Diabetes Other        grandmother    Past Surgical History:  Procedure Laterality Date  . CARPAL TUNNEL RELEASE Right    Dr Burney Gauze  . CATARACT EXTRACTION    . RETINAL TEAR REPAIR CRYOTHERAPY     10/09/13, then again 3/15   Social History   Occupational History  . Occupation: Non Patent attorney    Comment: [employer] and others  Tobacco Use  . Smoking status: Former Smoker    Last attempt to quit: 09/26/1993    Years since quitting: 25.1  . Smokeless tobacco: Never Used  . Tobacco comment: quit in 1996  Substance and Sexual Activity  . Alcohol use: Yes  Alcohol/week: 1.0 standard drinks    Types: 1 Standard drinks or equivalent per week    Comment: occasional wine  . Drug use: No  . Sexual activity: Never    Partners: Male    Comment: husband vasectomy

## 2018-12-01 ENCOUNTER — Encounter: Payer: Self-pay | Admitting: Physical Therapy

## 2018-12-03 ENCOUNTER — Telehealth: Payer: Self-pay | Admitting: Cardiovascular Disease

## 2018-12-03 ENCOUNTER — Telehealth: Payer: Self-pay | Admitting: Internal Medicine

## 2018-12-03 ENCOUNTER — Encounter: Payer: Self-pay | Admitting: Physical Therapy

## 2018-12-03 ENCOUNTER — Ambulatory Visit: Payer: Medicare Other | Attending: Internal Medicine | Admitting: Physical Therapy

## 2018-12-03 DIAGNOSIS — M25561 Pain in right knee: Secondary | ICD-10-CM | POA: Diagnosis not present

## 2018-12-03 DIAGNOSIS — R2681 Unsteadiness on feet: Secondary | ICD-10-CM

## 2018-12-03 DIAGNOSIS — M6281 Muscle weakness (generalized): Secondary | ICD-10-CM | POA: Diagnosis not present

## 2018-12-03 NOTE — Telephone Encounter (Signed)
Pt called to have a prescription for orthotics to be sent to [facility] in [place] faxed to [phone] for appt Tues 3/10 @ 1pm. (Pt said PT said they would fax ppw but pt wants to be proactive.)

## 2018-12-03 NOTE — Telephone Encounter (Signed)
Pt c/o medication issue:  1. Name of Medication: furosemide (LASIX)   2. How are you currently taking this medication (dosage and times per day)? 20 MG - 1 tablet as needed  3. Are you having a reaction (difficulty breathing--STAT)? Leg swelling, not just in ankles, also in thighs  4. What is your medication issue? Patient would like to know how many more would be ok to take since it is on an as needed basis.  Please call to discuss or send a mychart message.  Please advise if patient should be seen sooner than June.

## 2018-12-03 NOTE — Telephone Encounter (Signed)
Check with them--they usually have specific requests for the orthotics they want

## 2018-12-03 NOTE — Therapy (Signed)
[place] MAIN [facility] SERVICES [address] [place], [place], [address] Phone: [phone]   Fax:  [phone]  Physical Therapy Treatment  Patient Details  Name: Angela Bentley MRN: [id number] Date of Birth: October 31, 1940 Referring Provider (PT): Dr. Edilia Bo   Encounter Date: 12/03/2018  PT End of Session - 12/03/18 0944    Visit Number  3    Number of Visits  17    Date for PT Re-Evaluation  01/03/19    Authorization Type  start of care 11/07/18    PT Start Time  0932    PT Stop Time  1015    PT Time Calculation (min)  43 min    Activity Tolerance  Patient limited by pain    Behavior During Therapy  [facility] for tasks assessed/performed       Past Medical History:  Diagnosis Date  . Brain tumor ([place])    a. Left intraventricular tumor ->stable by 06/2017 MRI. Followed @ [facility].  . Gait disturbance    a. Unsteady on feet/balance difficulty.  . Generalized osteoarthritis of multiple sites   . GERD (gastroesophageal reflux disease)   . History of DVT (deep vein thrombosis) 2016   estrogen and airflights. Rx with xarelto for 3 months  . History of stress test    a. 11/2016 ETT: No ST/T changes.  Performed to assess PAC/PVC burden with activity ->No ectopy noted.  . Hypertension   . Hyperthyroidism    treated briefly in college  . Insomnia   . Obesity   . PAF (paroxysmal atrial fibrillation) ([place])    a. 12/2016 Event Monitor: brief run of PAF-->CHA2DS2VASc = 5--> Xarelto.  Marland Kitchen Retinal tear of left eye   . Rosacea   . Type 2 diabetes, controlled, with peripheral neuropathy ([place])    a. 11/2016 A1c 7.4.  . Uterine fibroid     Past Surgical History:  Procedure Laterality Date  . CARPAL TUNNEL RELEASE Right    Dr Burney Gauze  . CATARACT EXTRACTION    . RETINAL TEAR REPAIR CRYOTHERAPY     10/09/13, then again 3/15    There were no vitals filed for this visit.  Subjective Assessment - 12/03/18 0937    Subjective  Patient reports the MRI showed  right horizontal meniscus tear. She reports still having pain at night with difficulty sleeping; For last few weeks she has been having increased nerve pain that radiates up the thigh; She started neurotin; She also got an injection in right knee on Friday  morning with good relief.     Pertinent History  78 yo Female with acute right knee pain over last month. She reports pain started with extension exercise and has been exacerbated. She reports having increased pain at night with aching in knee with side sleeping. As a result she is not sleeping much at night. She did follow up with orthopedic MD who did x-rays, negative for fracture; He did do some orthopedic tests which showed possible right meniscus tear; she did get steroid injection in right knee with no relief 1 week later; she did stop taking prednisone per Dr. Edilia Bo (orthopedic); She is using ice intermittently which does help some. She also has various knee braces that she has tried intermittently. She reports that the aching at night is along knee cap and there is more clicking in patella. She reports sharp pain on each side of the patella that are tender. In addition she is having exacerbated right IT band irritation.  Currently in Pain?  Yes    Pain Score  3     Pain Location  Knee    Pain Orientation  Right    Pain Descriptors / Indicators  Sharp    Pain Type  Acute pain    Pain Onset  1 to 4 weeks ago    Pain Frequency  Intermittent    Aggravating Factors   worse with transfers/locking the knee    Pain Relieving Factors  rest/ice; pool exercise    Effect of Pain on Daily Activities  decreased tolerance with activity;     Multiple Pain Sites  No          TREATMENT: PT reinforced HEP with instruction to avoid excessive knee extension and to walk to tolerance but to not do strenuous activity; Overall patient reports feeling better since injection.  PT performed soft tissue massage to right quadriceps utilizing edge tool for  cross friction massage and IASTM to improve tissue extensibility;  Patient initially reported increased tenderness but with increased manual therapy reported less soreness and less thigh pain. She also exhibits better tissue extensibility with less bulge and better tissue mobility.   PT performed grade II-III AP mobs to right tibia-femoral joint 15 sec bouts followed by passive knee flexion and then full extension with joint mobs x 8 bouts to facilitate knee extension and reduce pain with meniscus tear; Patient tolerated well reporting less knee pain following joint mobs;  Patient would benefit from referral to orthotist for custom shoe insert on right foot to reduce pronation; She also has diabetes and would benefit from bilateral custom inserts to improve pressure relief and reduce skin breakdown. Will contact MD for referral; Patient tolerated session well reporting less pain at end of session;                    PT Education - 12/03/18 0944    Education Details  LE strengthening/manual therapy;     Person(s) Educated  Patient    Methods  Explanation;Verbal cues    Comprehension  Verbalized understanding;Returned demonstration;Verbal cues required;Need further instruction       PT Short Term Goals - 11/08/18 1040      PT SHORT TERM GOAL #1   Title  Patient will be adherent to HEP at least 3x a week to improve functional strength and balance for better safety at home.    Time  4    Period  Weeks    Status  New    Target Date  12/06/18      PT SHORT TERM GOAL #2   Title  Patient will increase RLE knee AROM extension to within 5 degrees of neutral for better standing and walking tolerance;     Time  4    Period  Weeks    Status  New    Target Date  12/06/18      PT SHORT TERM GOAL #3   Title  Patient will reports maximum of 1 sleep disturbances per night over last 3 days to exhibit improved sleeping tolerance with less knee pain and reduced fatigue.     Time  4     Period  Weeks    Status  New    Target Date  12/06/18        PT Long Term Goals - 11/08/18 1042      PT LONG TERM GOAL #1   Title  Patient will increase BLE gross strength to 4+/5 as to  improve functional strength for independent gait, increased standing tolerance and increased ADL ability.    Time  8    Period  Weeks    Status  New    Target Date  01/03/19      PT LONG TERM GOAL #2   Title  Patient will increase lower extremity functional scale to >60/80 to demonstrate improved functional mobility and increased tolerance with ADLs.     Time  8    Period  Weeks    Status  New    Target Date  01/03/19      PT LONG TERM GOAL #3   Title  Patient will report a worst pain of 3/10 on VAS in      right knee       to improve tolerance with ADLs and reduced symptoms with activities.     Time  8    Period  Weeks    Status  New    Target Date  01/03/19            Plan - 12/03/18 1216    Clinical Impression Statement  Patient tolerated session well. She does exhibit tightness in right quadriceps which is likely contributing to radiating pain. provided patient with information for orthotist for shoe inserts to improve foot positioning; Patient tolerated manual therapy well with slight improvement in symptoms. educated patient on importance of HEP adherence and to avoid excessive strenuous exercise. She would benefit from additional skilled PT Intervention to improve strength and reduce knee pain;     Rehab Potential  Good    Clinical Impairments Affecting Rehab Potential  motivated, good PLOF and good caregiver support    PT Frequency  2x / week    PT Duration  8 weeks    PT Treatment/Interventions  Aquatic Therapy;Cryotherapy;Electrical Stimulation;Moist Heat;Ultrasound;Gait training;Stair training;Functional mobility training;Therapeutic activities;Therapeutic exercise;Balance training;Neuromuscular re-education;Patient/family education;Orthotic Fit/Training;Manual techniques;Passive  range of motion;Energy conservation;Taping    PT Next Visit Plan  Revisit HEP assure proper performance and no symptoms aggravation     PT Home Exercise Plan  Seated and supine heel slides, quads sets, SAQ    Consulted and Agree with Plan of Care  Patient       Patient will benefit from skilled therapeutic intervention in order to improve the following deficits and impairments:  Abnormal gait, Decreased activity tolerance, Pain, Difficulty walking, Decreased mobility, Decreased balance, Decreased range of motion, Impaired flexibility, Hypomobility, Decreased strength  Visit Diagnosis: Acute pain of right knee  Muscle weakness (generalized)  Unsteadiness on feet     Problem List Patient Active Problem List   Diagnosis Date Noted  . Tinnitus, bilateral 11/28/2017  . Abnormal MRI of head 11/14/2017  . Meningioma ([place]) 09/21/2017  . Ataxia 05/11/2017  . PAF (paroxysmal atrial fibrillation) ([place]) 02/03/2017  . Preventative health care 06/02/2016  . Advance directive discussed with patient 06/02/2016  . Achilles tendonitis 03/11/2016  . Hypertension   . DVT, lower extremity, recurrent ([place])   . Type 2 diabetes, controlled, with peripheral neuropathy ([place])   . Generalized osteoarthritis of multiple sites   . GERD (gastroesophageal reflux disease)   . Hyperthyroidism   . Retinal hole of right eye 06/26/2014  . Amblyopia, right 05/08/2014  . Status post intraocular lens implant 05/08/2014    Trotter,Margaret PT, DPT 12/03/2018, 12:24 PM  [place] MAIN [facility] SERVICES [address] [place], [place], [address] Phone: [phone]   Fax:  [phone]  Name: Angela Bentley MRN: [id number]  Date of Birth: Dec 15, 1940

## 2018-12-04 ENCOUNTER — Telehealth (INDEPENDENT_AMBULATORY_CARE_PROVIDER_SITE_OTHER): Payer: Self-pay

## 2018-12-04 ENCOUNTER — Telehealth (INDEPENDENT_AMBULATORY_CARE_PROVIDER_SITE_OTHER): Payer: Self-pay | Admitting: Orthopedic Surgery

## 2018-12-04 MED ORDER — GABAPENTIN 100 MG PO CAPS: ORAL_CAPSULE | ORAL | 0 refills | Status: DC

## 2018-12-04 NOTE — Telephone Encounter (Signed)
RX was faxed before 1pm and I left for lunch before receiving this message. Left detailed message on VM for pt.

## 2018-12-04 NOTE — Telephone Encounter (Signed)
Spoke to [employer] at [employer]. She said they need an order for whatever it is Dr [name] believes she needs. If it is foot orthotics, they need an order to read evaluate and treat for foot orthotics and the Dx.

## 2018-12-04 NOTE — Addendum Note (Signed)
Addended byLaurann Montana on: 12/04/2018 12:52 PM   Modules accepted: Orders

## 2018-12-04 NOTE — Telephone Encounter (Signed)
Spoke with patient. She states she never started rx because pharmacy advised her never received. I advised I had spoke with pharmacy this a.m. and they said they had it. I did go ahead and resubmit to pharmacy as requested.

## 2018-12-04 NOTE — Telephone Encounter (Signed)
Should not need refill. Patient just received rx 4 days ago. Tried calling her to discuss. No answer. LM advising

## 2018-12-04 NOTE — Telephone Encounter (Signed)
Returned the patient's call. Lmtcb. Patient rqst that a mychart message be sent.

## 2018-12-04 NOTE — Telephone Encounter (Signed)
New Message   *STAT* If patient is at the pharmacy, call can be transferred to refill team.   1. Which medications need to be refilled? (please list name of each medication and dose if known)  Gabapentin (Neurontin) 100 mg capsule, 1 po q hs x 10 days then 2 po q hs x 10 days  2. Which pharmacy/location (including street and city if local pharmacy) is medication to be sent to? [facility], [address], [place], [place]  3. Do they need a 30 day or 90 day supply?  30 day supply

## 2018-12-04 NOTE — Telephone Encounter (Signed)
Patient called to verify rx for neurontin submitted. I did advise I sent earlier this afternoon.

## 2018-12-04 NOTE — Telephone Encounter (Signed)
Might be better to have Dr Lorelei Pont deal with this issue---I am not sure she needs orthotics (and if she does, he may be able to help her out here at lower cost)

## 2018-12-05 ENCOUNTER — Encounter: Payer: Self-pay | Admitting: Physical Therapy

## 2018-12-05 ENCOUNTER — Other Ambulatory Visit: Payer: Self-pay

## 2018-12-05 ENCOUNTER — Ambulatory Visit: Payer: Medicare Other | Admitting: Physical Therapy

## 2018-12-05 DIAGNOSIS — M25561 Pain in right knee: Secondary | ICD-10-CM | POA: Diagnosis not present

## 2018-12-05 DIAGNOSIS — R2681 Unsteadiness on feet: Secondary | ICD-10-CM

## 2018-12-05 DIAGNOSIS — M6281 Muscle weakness (generalized): Secondary | ICD-10-CM

## 2018-12-05 NOTE — Therapy (Signed)
[place] MAIN [facility] SERVICES [place], [place], [address] Phone: [phone]   Fax:  [phone]  Physical Therapy Treatment  Patient Details  Name: Angela Bentley MRN: [id number] Date of Birth: 17-Mar-1941 Referring Provider (PT): Dr. Edilia Bo   Encounter Date: 12/05/2018  PT End of Session - 12/05/18 1026    Visit Number  4    Number of Visits  17    Date for PT Re-Evaluation  01/03/19    Authorization Type  start of care 11/07/18    PT Start Time  1020    PT Stop Time  1100    PT Time Calculation (min)  40 min    Activity Tolerance  Patient limited by pain    Behavior During Therapy  [facility] for tasks assessed/performed       Past Medical History:  Diagnosis Date  . Brain tumor ([place])    a. Left intraventricular tumor ->stable by 06/2017 MRI. Followed @ [facility].  . Gait disturbance    a. Unsteady on feet/balance difficulty.  . Generalized osteoarthritis of multiple sites   . GERD (gastroesophageal reflux disease)   . History of DVT (deep vein thrombosis) 2016   estrogen and airflights. Rx with xarelto for 3 months  . History of stress test    a. 11/2016 ETT: No ST/T changes.  Performed to assess PAC/PVC burden with activity ->No ectopy noted.  . Hypertension   . Hyperthyroidism    treated briefly in college  . Insomnia   . Obesity   . PAF (paroxysmal atrial fibrillation) ([place])    a. 12/2016 Event Monitor: brief run of PAF-->CHA2DS2VASc = 5--> Xarelto.  Marland Kitchen Retinal tear of left eye   . Rosacea   . Type 2 diabetes, controlled, with peripheral neuropathy ([place])    a. 11/2016 A1c 7.4.  . Uterine fibroid     Past Surgical History:  Procedure Laterality Date  . CARPAL TUNNEL RELEASE Right    Dr Burney Gauze  . CATARACT EXTRACTION    . RETINAL TEAR REPAIR CRYOTHERAPY     10/09/13, then again 3/15    There were no vitals filed for this visit.  Subjective Assessment - 12/05/18 1024    Subjective  Patient reports mild  improvement in right knee since last session; She reports starting the gabapentin with significant improvement in how she feels.     Pertinent History  78 yo Female with acute right knee pain over last month. She reports pain started with extension exercise and has been exacerbated. She reports having increased pain at night with aching in knee with side sleeping. As a result she is not sleeping much at night. She did follow up with orthopedic MD who did x-rays, negative for fracture; He did do some orthopedic tests which showed possible right meniscus tear; she did get steroid injection in right knee with no relief 1 week later; she did stop taking prednisone per Dr. Edilia Bo (orthopedic); She is using ice intermittently which does help some. She also has various knee braces that she has tried intermittently. She reports that the aching at night is along knee cap and there is more clicking in patella. She reports sharp pain on each side of the patella that are tender. In addition she is having exacerbated right IT band irritation.     Currently in Pain?  Yes    Pain Score  2     Pain Location  Knee    Pain Orientation  Right  Pain Descriptors / Indicators  Aching;Sore    Pain Type  Acute pain    Pain Onset  1 to 4 weeks ago    Pain Frequency  Intermittent    Aggravating Factors   worse with transfers/locking the knee    Pain Relieving Factors  rest/ice/pool exercise    Effect of Pain on Daily Activities  decreased tolerance with activity;     Multiple Pain Sites  No           TREATMENT: PT reinforced HEP with instruction to avoid excessive knee extension and to walk to tolerance but to not do strenuous activity; Overall patient reports feeling better since injection.  PT performed soft tissue massage to right quadriceps utilizing edge tool for cross friction massage and IASTM to improve tissue extensibility; x20 min to RLE quadriceps muscle;  Patient initially reported increased  tenderness but with increased manual therapy reported less soreness and less thigh pain. She also exhibits better tissue extensibility with less bulge and better tissue mobility.   PT performed grade II-III AP mobs to right tibia-femoral joint 15 sec bouts followed by passive knee flexion and then full extension with joint mobs x10 bouts to facilitate knee extension and reduce pain with meniscus tear; Patient tolerated well reporting less knee pain following joint mobs;  Patient tolerated session well reporting less pain at end of session; She did order the inserts for her shoes to improve foot alignment.                       PT Education - 12/05/18 1026    Education Details  LE strengthening/manual therapy;     Person(s) Educated  Patient    Methods  Explanation;Verbal cues    Comprehension  Verbalized understanding;Returned demonstration;Verbal cues required;Need further instruction       PT Short Term Goals - 11/08/18 1040      PT SHORT TERM GOAL #1   Title  Patient will be adherent to HEP at least 3x a week to improve functional strength and balance for better safety at home.    Time  4    Period  Weeks    Status  New    Target Date  12/06/18      PT SHORT TERM GOAL #2   Title  Patient will increase RLE knee AROM extension to within 5 degrees of neutral for better standing and walking tolerance;     Time  4    Period  Weeks    Status  New    Target Date  12/06/18      PT SHORT TERM GOAL #3   Title  Patient will reports maximum of 1 sleep disturbances per night over last 3 days to exhibit improved sleeping tolerance with less knee pain and reduced fatigue.     Time  4    Period  Weeks    Status  New    Target Date  12/06/18        PT Long Term Goals - 11/08/18 1042      PT LONG TERM GOAL #1   Title  Patient will increase BLE gross strength to 4+/5 as to improve functional strength for independent gait, increased standing tolerance and increased  ADL ability.    Time  8    Period  Weeks    Status  New    Target Date  01/03/19      PT LONG TERM GOAL #2   Title  Patient will increase lower extremity functional scale to >60/80 to demonstrate improved functional mobility and increased tolerance with ADLs.     Time  8    Period  Weeks    Status  New    Target Date  01/03/19      PT LONG TERM GOAL #3   Title  Patient will report a worst pain of 3/10 on VAS in      right knee       to improve tolerance with ADLs and reduced symptoms with activities.     Time  8    Period  Weeks    Status  New    Target Date  01/03/19            Plan - 12/05/18 1233    Clinical Impression Statement  patient tolerated session well exhibiting improved tissue extensbility and reporting less pain in right knee. She does continue to have tightness in right quad which is a visable bulging above her knee. This was somewhat improved with soft tissue massage which has helped relieve tenderness. Patient would benefit from additional skilled PT intervention to improve strength, balance and gait safety;     Rehab Potential  Good    Clinical Impairments Affecting Rehab Potential  motivated, good PLOF and good caregiver support    PT Frequency  2x / week    PT Duration  8 weeks    PT Treatment/Interventions  Aquatic Therapy;Cryotherapy;Electrical Stimulation;Moist Heat;Ultrasound;Gait training;Stair training;Functional mobility training;Therapeutic activities;Therapeutic exercise;Balance training;Neuromuscular re-education;Patient/family education;Orthotic Fit/Training;Manual techniques;Passive range of motion;Energy conservation;Taping    PT Next Visit Plan  Revisit HEP assure proper performance and no symptoms aggravation     PT Home Exercise Plan  Seated and supine heel slides, quads sets, SAQ    Consulted and Agree with Plan of Care  Patient       Patient will benefit from skilled therapeutic intervention in order to improve the following deficits and  impairments:  Abnormal gait, Decreased activity tolerance, Pain, Difficulty walking, Decreased mobility, Decreased balance, Decreased range of motion, Impaired flexibility, Hypomobility, Decreased strength  Visit Diagnosis: Acute pain of right knee  Muscle weakness (generalized)  Unsteadiness on feet     Problem List Patient Active Problem List   Diagnosis Date Noted  . Tinnitus, bilateral 11/28/2017  . Abnormal MRI of head 11/14/2017  . Meningioma ([place]) 09/21/2017  . Ataxia 05/11/2017  . PAF (paroxysmal atrial fibrillation) ([place]) 02/03/2017  . Preventative health care 06/02/2016  . Advance directive discussed with patient 06/02/2016  . Achilles tendonitis 03/11/2016  . Hypertension   . DVT, lower extremity, recurrent ([place])   . Type 2 diabetes, controlled, with peripheral neuropathy ([place])   . Generalized osteoarthritis of multiple sites   . GERD (gastroesophageal reflux disease)   . Hyperthyroidism   . Retinal hole of right eye 06/26/2014  . Amblyopia, right 05/08/2014  . Status post intraocular lens implant 05/08/2014    Trotter,Margaret PT, DPT 12/05/2018, 12:34 PM  [place] MAIN [facility] SERVICES [address] [place], [place], [address] Phone: [phone]   Fax:  [phone]  Name: Angela Bentley MRN: [id number] Date of Birth: [birth year]

## 2018-12-06 NOTE — Telephone Encounter (Signed)
Hard to say without seeing legs In CHF BNP goes up and pressures up on echo These are options if she would like Could also come in for visit if bothering her

## 2018-12-07 NOTE — Telephone Encounter (Signed)
Called patient to make her aware of Dr.Gollan' response. Adv that I will fwd his response through mychart. Patient is to contact the office if any concerns.

## 2018-12-10 ENCOUNTER — Ambulatory Visit: Payer: Medicare Other | Admitting: Physical Therapy

## 2018-12-12 ENCOUNTER — Ambulatory Visit: Payer: Medicare Other | Admitting: Physical Therapy

## 2018-12-17 ENCOUNTER — Ambulatory Visit: Payer: Medicare Other | Admitting: Physical Therapy

## 2018-12-18 NOTE — Therapy (Signed)
[place] MAIN [facility] SERVICES [address] [place], [place], [address] Phone: [phone]   Fax:  [phone]  Patient Details  Name: Angela Bentley MRN: [id number] Date of Birth: [birth year] Referring Provider:  No ref. provider found  Encounter Date: 12/18/2018   Patient called by PT to ensure patient is doing well, does not have questions, and review HEP. Called due to current outpatient closure for COVID- 19. Patient did not pick up PT call. PT left voicemail stating reason for call and number for call back for further questions/concerns.    Lieutenant Diego PT, DPT 8:57 AM,12/18/18 [place] MAIN [facility] SERVICES [address] [place], [place], [address] Phone: [phone]   Fax:  [phone]

## 2018-12-19 ENCOUNTER — Ambulatory Visit: Payer: Medicare Other | Admitting: Physical Therapy

## 2018-12-19 ENCOUNTER — Telehealth: Payer: Self-pay | Admitting: Internal Medicine

## 2018-12-19 MED ORDER — HYDROCODONE-HOMATROPINE 5-1.5 MG/5ML PO SYRP: 5.0000 mL | ORAL_SOLUTION | Freq: Every evening | ORAL | 0 refills | Status: DC | PRN

## 2018-12-19 NOTE — Telephone Encounter (Signed)
Let her know that I sent the refill and she should let us know if there is a serious change---like high fever or significant SOB

## 2018-12-19 NOTE — Telephone Encounter (Signed)
Pt stated she still having a cough and want a refill on her Hydrocodone cough syrup.     [facility]

## 2018-12-19 NOTE — Telephone Encounter (Signed)
Spoke to pt

## 2018-12-21 ENCOUNTER — Telehealth: Payer: Self-pay

## 2018-12-21 NOTE — Telephone Encounter (Signed)
Spoke with patient to follow up on constipation issue patient reported to the after hours nurse. Patient states she has never experienced this before. The only thing different she has done is drinking Codeine syrup for her cough on 12/18/2018 and 12/19/2018. She had a regular bowel movement on 12/17/2018 (she thinks that was the last normal one so far) -usually has one every morning or every other morning. Yesterday-12/20/2018- she developed discomfort/pain with trying to have a bowel movement. She felt it been stuck in her tail bone/bottom. It hurt to sit, stand, etc. She could see the bowel was in her rectum but could not push it out. When she did go to the bathroom at 3 pm to 5 pm she noticed pain in the rectal area and blood in the toilet and on toilet paper but not much bowel. She ended up taking Dulcolax, suppository OTC and Miralax yesterday. After talking to the after hours nurse she did not want to go to ER with the virus going around but was considering it. Finally at 3 am today-12/21/2018 she had a huge bowel movement-size of her feast per patient. Since that time she has had 2 to 3 more bowel movements (runny/explosive some)-gassy,, a lot of air coming out. No more blood. No more pain, no burning or itching present. No fever, no vomiting, no abdominal pain or cramping. She does have decrease appetite feeling. She is tired. She is wondering if she needs to be checked for any damage in her rectal area? What should she be eating-fruits, meats, soft foods, etc, what diet to resume? CB: [phone].

## 2018-12-21 NOTE — Telephone Encounter (Signed)
Lesson learned---this was because of the codeine and she should take miralax once or twice a day (and maybe also senna-s 2 tabs a day) the next time she takes any narcotic.  If she is not in ongoing pain, or passing a lot of blood, it is unlikely she has done any significant damage to her rectal area (a little blood, especially when she wipes would be expected). I would not restrict her diet----she can have whatever appeals to her. It is not unusual for her appetite to be affected somewhat--but I would expect that to come back to normal fairly soon)

## 2018-12-21 NOTE — Telephone Encounter (Signed)
Spoke to patient's husband per PDR. He said she is sleeping and is doing better. Miralax really helped. He will let her know what Dr [name] said.

## 2018-12-21 NOTE — Telephone Encounter (Signed)
[facility] Patient Name: MAXENE BYINGTON Gender: Female DOB: November 07, 1940 Age: 78 Y 44 M 12 D Return Phone Number: [phone] (Primary), [phone] (Secondary) Address: City/State/Zip: [address] [place] [address] Client [facility] Night - Client Client Site [place] Physician Viviana Simpler - MD Contact Type Call Who Is Calling Patient / Member / Family / Caregiver Call Type Triage / Clinical Relationship To Patient Self Return Phone Number [phone] (Secondary) Chief Complaint Blood In Stool Reason for Call Symptomatic / Request for [place] states that she is constipated.She is having blood in what little feces is coming out. She has taken Mira lax and a suppository but nothing is helping. She also is having difficulty urinating. Translation No Nurse Assessment Nurse: White, RN, Sharen Hint Date/Time (Eastern Time): 12/20/2018 11:08:29 PM Confirm and document reason for call. If symptomatic, describe symptoms. ---Caller states that she is constipated. She is having blood in what little feces is coming out. She has taken Miralax and a suppository but nothing is helping. She also is having difficulty urinating, but pants are wet. Has been taking codeine cough syrup d/t hay fever. Back pain also. No fever. Has the patient traveled to [place], [place], [place], [place], or [place] OR had close contact with a person known to have the novel coronavirus illness in the last 14 days? ---No Does the patient have any new or worsening symptoms? ---Yes Will a triage be completed? ---Yes Related visit to physician within the last 2 weeks? ---No Does the PT have any chronic conditions? (i.e. diabetes, asthma, this includes High risk factors for pregnancy, etc.) ---Yes List chronic conditions. ---DM II, HTN, Xarelto d/t  Afib Is this a behavioral health or substance abuse call? ---No Guidelines Guideline Title Affirmed Question Affirmed Notes Nurse Date/Time (Eastern Time) Rectal Bleeding Taking Coumadin (warfarin) or other strong blood thinner, or known bleeding disorder (e.g., thrombocytopenia) White, RN, Sharen Hint 12/20/2018 11:16:07 PM PLEASE NOTE: All timestamps contained within this report are represented as [place] Standard Time. CONFIDENTIALTY NOTICE: This fax transmission is intended only for the addressee. It contains information that is legally privileged, confidential or otherwise protected from use or disclosure. If you are not the intended recipient, you are strictly prohibited from reviewing, disclosing, copying using or disseminating any of this information or taking any action in reliance on or regarding this information. If you have received this fax in error, please notify us immediately by telephone so that we can arrange for its return to [place]. Phone: [phone], Toll-Free: [phone], Fax: [phone] Page: 2 of 2 Call Id: [id number] [place]. Time Eilene Ghazi Time) Disposition Final User 12/20/2018 11:22:56 PM Go to ED Now (or PCP triage) Yes Dema Severin, RN, Sharen Hint Caller Disagree/Comply Disagree Caller Understands Yes PreDisposition Reagan Advice Given Per Guideline GO TO ED NOW (OR PCP TRIAGE): * IF NO PCP (PRIMARY CARE PROVIDER) SECOND-LEVEL TRIAGE: You need to be seen within the next hour. Go to the [facility] at _____________ [place] as soon as you can. CARE ADVICE given per Rectal Bleeding (Adult) guideline. Referrals GO TO FACILITY REFUSED

## 2018-12-24 ENCOUNTER — Ambulatory Visit: Payer: Medicare Other | Admitting: Physical Therapy

## 2018-12-26 ENCOUNTER — Ambulatory Visit: Payer: Medicare Other | Admitting: Physical Therapy

## 2018-12-31 ENCOUNTER — Ambulatory Visit: Payer: Medicare Other | Admitting: Physical Therapy

## 2019-01-02 ENCOUNTER — Ambulatory Visit: Payer: Medicare Other | Admitting: Physical Therapy

## 2019-01-07 ENCOUNTER — Ambulatory Visit: Payer: Medicare Other | Admitting: Physical Therapy

## 2019-01-09 ENCOUNTER — Ambulatory Visit: Payer: Medicare Other | Admitting: Physical Therapy

## 2019-01-09 NOTE — Therapy (Signed)
[place] MAIN [facility] SERVICES [address] [place], [place], [address] Phone: [phone]   Fax:  [phone]  Patient Details  Name: Angela Bentley MRN: [id number] Date of Birth: 1941/03/15 Referring Provider:  No ref. provider found  Encounter Date: 01/09/2019  The [facility] outpatient clinics are closed at this time due to COVID-19. The patient was contacted in regards to therapy services. No answer on phone so left voicemail. Education provided about telehealth therapy services. Pt asked to return call to notify clinic of progress with home exercise program as well as discuss interest in pursuing telehealth therapy visits.   [name] Mycal Conde PT, DPT, GCS  Davaughn Hillyard 01/09/2019, 11:12 AM  [place] MAIN [facility] SERVICES [address] [place], [place], [address] Phone: [phone]   Fax:  [phone]

## 2019-02-16 ENCOUNTER — Encounter: Payer: Self-pay | Admitting: Physical Therapy

## 2019-02-19 DIAGNOSIS — H43813 Vitreous degeneration, bilateral: Secondary | ICD-10-CM | POA: Diagnosis not present

## 2019-02-19 DIAGNOSIS — H1859 Other hereditary corneal dystrophies: Secondary | ICD-10-CM | POA: Diagnosis not present

## 2019-02-19 DIAGNOSIS — E119 Type 2 diabetes mellitus without complications: Secondary | ICD-10-CM | POA: Diagnosis not present

## 2019-02-19 DIAGNOSIS — H52203 Unspecified astigmatism, bilateral: Secondary | ICD-10-CM | POA: Diagnosis not present

## 2019-02-19 LAB — HM DIABETES EYE EXAM

## 2019-02-27 ENCOUNTER — Telehealth: Payer: Self-pay

## 2019-02-27 NOTE — Telephone Encounter (Signed)

## 2019-03-07 ENCOUNTER — Other Ambulatory Visit: Payer: Self-pay

## 2019-03-08 ENCOUNTER — Other Ambulatory Visit: Payer: Self-pay

## 2019-03-08 ENCOUNTER — Encounter: Payer: Self-pay | Admitting: Obstetrics & Gynecology

## 2019-03-08 ENCOUNTER — Ambulatory Visit (INDEPENDENT_AMBULATORY_CARE_PROVIDER_SITE_OTHER): Payer: Medicare Other | Admitting: Obstetrics & Gynecology

## 2019-03-08 VITALS — BP 130/66 | HR 84 | Temp 97.6°F | Ht 65.5 in | Wt 206.0 lb

## 2019-03-08 DIAGNOSIS — R42 Dizziness and giddiness: Secondary | ICD-10-CM | POA: Diagnosis not present

## 2019-03-08 DIAGNOSIS — Z124 Encounter for screening for malignant neoplasm of cervix: Secondary | ICD-10-CM

## 2019-03-08 DIAGNOSIS — K5900 Constipation, unspecified: Secondary | ICD-10-CM

## 2019-03-08 DIAGNOSIS — Z01419 Encounter for gynecological examination (general) (routine) without abnormal findings: Secondary | ICD-10-CM | POA: Diagnosis not present

## 2019-03-08 DIAGNOSIS — R140 Abdominal distension (gaseous): Secondary | ICD-10-CM

## 2019-03-08 MED ORDER — NYSTATIN 100000 UNIT/GM EX CREA: 1.0000 "application " | TOPICAL_CREAM | Freq: Two times a day (BID) | CUTANEOUS | 1 refills | Status: DC

## 2019-03-08 MED ORDER — MIRABEGRON ER 25 MG PO TB24: 25.0000 mg | ORAL_TABLET | Freq: Every day | ORAL | 2 refills | Status: DC

## 2019-03-08 NOTE — Progress Notes (Signed)
78 y.o. G5P3 Married White or Caucasian female here for annual exam.  Doing well.  Having some trouble with some sleeping due to bowel and bladder changes.  Reports she had some issues with chronic cough and she took some codeine cough syrup in late Feb/early March.  She had an episode of severe constipation after taking the codeine cough syrup.  She has been using dulcolax with some improvement but she can't seem to stop it now.    Reports at night, she wakes up and voids multiple times.  Reports she can void up to 6 times at night.  This is really affecting her sleep.    Denies vaginal bleeding.    Does have some issues with LE edema.  She is on lasix that she takes every 3 weeks or so.  She takes potassium with this when she takes it.    Was diagnosed with Afib two years ago.  She is now on Xarelto.  Patient's last menstrual period was 09/26/1997.          Sexually active: Yes.    The current method of family planning is post menopausal status.    Exercising: Yes.    PT Smoker:  no  Health Maintenance: Pap:  04/21/16 Neg  12/24/13 Neg  History of abnormal Pap:  no MMG:  11/20/15 BIRADS1:neg.  Colonoscopy:  01/20/14 polyps, Dr. Michail Sermon.  No additional colonoscopy tests recommended.   BMD:   2017 TDaP:  2014 Pneumonia vaccine(s):  2018 Shingrix:   Had 1  Hep C testing: n/a Screening Labs: PCP   reports that she quit smoking about 25 years ago. She has never used smokeless tobacco. She reports current alcohol use. She reports that she does not use drugs.  Past Medical History:  Diagnosis Date  . Balance problems   . Brain tumor ([place])    a. Left intraventricular tumor ->stable by 06/2017 MRI. Followed @ [facility].  . Gait disturbance    a. Unsteady on feet/balance difficulty.  . Generalized osteoarthritis of multiple sites   . GERD (gastroesophageal reflux disease)   . History of DVT (deep vein thrombosis) 2016   estrogen and airflights. Rx with xarelto for 3 months  . History of  stress test    a. 11/2016 ETT: No ST/T changes.  Performed to assess PAC/PVC burden with activity ->No ectopy noted.  . Hypertension   . Hyperthyroidism    treated briefly in college  . Insomnia   . Obesity   . PAF (paroxysmal atrial fibrillation) ([place])    a. 12/2016 Event Monitor: brief run of PAF-->CHA2DS2VASc = 5--> Xarelto.  . Plantar fasciitis   . Retinal tear of left eye   . Rosacea   . Type 2 diabetes, controlled, with peripheral neuropathy ([place])    a. 11/2016 A1c 7.4.  . Uterine fibroid     Past Surgical History:  Procedure Laterality Date  . CARPAL TUNNEL RELEASE Right    Dr Burney Gauze  . CATARACT EXTRACTION    . RETINAL TEAR REPAIR CRYOTHERAPY     10/09/13, then again 3/15    Current Outpatient Medications  Medication Sig Dispense Refill  . Ascorbic Acid (VITAMIN C) 100 MG CHEW Chew 200 mg by mouth daily as needed (immune system support).    . celecoxib (CELEBREX) 200 MG capsule TAKE 1 CAPSULE BY MOUTH ONCE DAILY AS NEEDED FOR MILD TO MODERATE PAIN 30 capsule 5  . diclofenac sodium (VOLTAREN) 1 % GEL Apply 4 g topically 4 (four) times daily.  100 g 2  . fluticasone (VERAMYST) 27.5 MCG/SPRAY nasal spray Place 2 sprays into the nose daily as needed for allergies.     . furosemide (LASIX) 20 MG tablet Take 1 tablet (20 mg total) by mouth daily as needed. 30 tablet 3  . glimepiride (AMARYL) 1 MG tablet TAKE 1 TABLET EVERY DAY WITH BREAKFAST 90 tablet 3  . losartan-hydrochlorothiazide (HYZAAR) 100-12.5 MG tablet Take 1 tablet by mouth daily. 90 tablet 3  . metoprolol succinate (TOPROL-XL) 25 MG 24 hr tablet Take 1 tablet (25 mg total) by mouth daily. 90 tablet 3  . Multiple Vitamin (MULTIVITAMIN) tablet Take 1 tablet by mouth daily.    . pioglitazone (ACTOS) 45 MG tablet Take 1 tablet (45 mg total) by mouth daily. 90 tablet 3  . potassium chloride SA (K-DUR,KLOR-CON) 20 MEQ tablet Take 1 tablet (20 mEq total) by mouth daily as needed. 30 tablet 3  . rivaroxaban (XARELTO) 20 MG  TABS tablet Take 1 tablet (20 mg total) by mouth daily with supper. 90 tablet 3   No current facility-administered medications for this visit.     Family History  Problem Relation Age of Onset  . Diabetes Father   . Pancreatic cancer Father   . Diabetes Other        grandmother    Review of Systems  Genitourinary: Positive for frequency.  Psychiatric/Behavioral: Positive for sleep disturbance.  All other systems reviewed and are negative.   Exam:   BP 130/66   Pulse 84   Temp 97.6 F (36.4 C) (Temporal)   Ht 5' 5.5" (1.664 m)   Wt 206 lb (93.4 kg)   LMP 09/26/1997   BMI 33.76 kg/m      Height: 5' 5.5" (166.4 cm)  Ht Readings from Last 3 Encounters:  03/08/19 5' 5.5" (1.664 m)  11/19/18 5\' 6"  (1.676 m)  10/25/18 5\' 6"  (1.676 m)    General appearance: alert, cooperative and appears stated age Head: Normocephalic, without obvious abnormality, atraumatic Neck: no adenopathy, supple, symmetrical, trachea midline and thyroid normal to inspection and palpation Lungs: clear to auscultation bilaterally Breasts: normal appearance, no masses or tenderness Heart: regular rate and rhythm Abdomen: soft, non-tender; bowel sounds normal; no masses,  no organomegaly Extremities: extremities normal, atraumatic, no cyanosis or edema Skin: Skin color, texture, turgor normal. No rashes or lesions Lymph nodes: Cervical, supraclavicular, and axillary nodes normal. No abnormal inguinal nodes palpated Neurologic: Grossly normal   Pelvic: External genitalia:  no lesions              Urethra:  normal appearing urethra with no masses, tenderness or lesions              Bartholins and Skenes: normal                 Vagina: normal appearing vagina with normal color and discharge, no lesions              Cervix: no lesions              Pap taken: No. Bimanual Exam:  Uterus:  normal size, contour, position, consistency, mobility, non-tender              Adnexa: normal adnexa and no mass,  fullness, tenderness               Rectovaginal: Confirms               Anus:  normal sphincter tone, no lesions  Chaperone  was present for exam.  A:  Well Woman with normal exam PMP, no HRT H/o uterine fibroids Episode of severe constipation earlier this year with subsequent change in bowel movements and bloating OAB Diabetes Insomnia  Skin yeast  P:   Mammogram guidelines reviewed.  As she would have life expectancy at this time of more than 10 years, advised may want to consider MMG every other year (acording to ACS guidelines) pap smear not indicated Return for PUS Trial of Myrbetriq 25mg  daily.  #30/2RF.  Side effects, risks reviewed including hypertension.  She will start checking BP every 2-3 days at home for next 2-3 weeks. Nystatin cream rx to pharamcy Return annually or prn

## 2019-03-11 ENCOUNTER — Telehealth: Payer: Self-pay | Admitting: Obstetrics & Gynecology

## 2019-03-11 NOTE — Telephone Encounter (Signed)
Call placed to patient to review benefits and schedule recommended ultrasound. Left voicemail message requesting a return call °

## 2019-03-11 NOTE — Progress Notes (Signed)
Virtual Visit via Telephone Note   This visit type was conducted due to national recommendations for restrictions regarding the COVID-19 Pandemic (e.g. social distancing) in an effort to limit this patient's exposure and mitigate transmission in our community.  Due to her co-morbid illnesses, this patient is at least at moderate risk for complications without adequate follow up.  This format is felt to be most appropriate for this patient at this time.  The patient did not have access to video technology/had technical difficulties with video requiring transitioning to audio format only (telephone).  All issues noted in this document were discussed and addressed.  No physical exam could be performed with this format.  Please refer to the patient's chart for her  consent to telehealth for [facility].   I connected with  Angela Bentley on 03/12/19 by a video enabled telemedicine application and verified that I am speaking with the correct person using two identifiers. I discussed the limitations of evaluation and management by telemedicine. The patient expressed understanding and agreed to proceed.   Evaluation Performed:  Follow-up visit  Date:  03/12/2019   ID:  Angela Bentley, DOB 10/10/1940, MRN [id number]  Patient Location:  [address] [place] [address]   Provider location:   [facility], [place] office  PCP:  [name], MD  Cardiologist:  Arvid Right [facility]   Chief Complaint:  Knee pain    History of Present Illness:    Angela Bentley is a 78 y.o. female who presents via audio/video conferencing for a telehealth visit today.   The patient does not symptoms concerning for COVID-19 infection (fever, chills, cough, or new SHORTNESS OF BREATH).   Patient has a past medical history of paroxysmal atrial fibrillation,  hypertension,  diabetes,  GERD,  persistent gait imbalance,  left intraventricular brain tumor,  prior history of DVT.   Who presents for f/u of her PAF  Pain,  Lack of sleep, no exercise No significant SOB No palpitations  Total chol 172, LDL 98, HBA1C 7.4  diagnosed with paroxysmal atrial fibrillation after complaining of palpitations and wearing an event monitor which showed a brief run of atrial fibrillation.  Xarelto  CHA2DS2VASc of 5.   No atrial fib by symptoms Sedentary lifestyle Chronic dizziness, balance issues with no falls  History of left intraventricular brain tumor.   followed by neurosurgery.  not currently interested in resection  Periodic MRIs  Reports having worsening ankle swelling sometimes take lasix   Prior CV studies:   The following studies were reviewed today:   Past Medical History:  Diagnosis Date  . Balance problems   . Brain tumor ([place])    a. Left intraventricular tumor ->stable by 06/2017 MRI. Followed @ [facility].  . Gait disturbance    a. Unsteady on feet/balance difficulty.  . Generalized osteoarthritis of multiple sites   . GERD (gastroesophageal reflux disease)   . History of DVT (deep vein thrombosis) 2016   estrogen and airflights. Rx with xarelto for 3 months  . History of stress test    a. 11/2016 ETT: No ST/T changes.  Performed to assess PAC/PVC burden with activity ->No ectopy noted.  . Hypertension   . Hyperthyroidism    treated briefly in college  . Insomnia   . Obesity   . PAF (paroxysmal atrial fibrillation) ([place])    a. 12/2016 Event Monitor: brief run of PAF-->CHA2DS2VASc = 5--> Xarelto.  . Plantar fasciitis   . Retinal tear of  left eye   . Rosacea   . Type 2 diabetes, controlled, with peripheral neuropathy ([place])    a. 11/2016 A1c 7.4.  . Uterine fibroid    Past Surgical History:  Procedure Laterality Date  . CARPAL TUNNEL RELEASE Right    Dr Burney Gauze  . CATARACT EXTRACTION    . RETINAL TEAR REPAIR CRYOTHERAPY     10/09/13, then again 3/15     No outpatient medications have been marked as taking for the 03/12/19  encounter (Appointment) with Minna Merritts, MD.     Allergies:   Penicillins, Sulfa antibiotics, and Azithromycin   Social History   Tobacco Use  . Smoking status: Former Smoker    Quit date: 09/26/1993    Years since quitting: 25.4  . Smokeless tobacco: Never Used  . Tobacco comment: quit in 1996  Substance Use Topics  . Alcohol use: Yes    Alcohol/week: 0.0 - 1.0 standard drinks  . Drug use: No     Current Outpatient Medications on File Prior to Visit  Medication Sig Dispense Refill  . Ascorbic Acid (VITAMIN C) 100 MG CHEW Chew 200 mg by mouth daily as needed (immune system support).    . celecoxib (CELEBREX) 200 MG capsule TAKE 1 CAPSULE BY MOUTH ONCE DAILY AS NEEDED FOR MILD TO MODERATE PAIN 30 capsule 5  . diclofenac sodium (VOLTAREN) 1 % GEL Apply 4 g topically 4 (four) times daily. 100 g 2  . fluticasone (VERAMYST) 27.5 MCG/SPRAY nasal spray Place 2 sprays into the nose daily as needed for allergies.     . furosemide (LASIX) 20 MG tablet Take 1 tablet (20 mg total) by mouth daily as needed. 30 tablet 3  . glimepiride (AMARYL) 1 MG tablet TAKE 1 TABLET EVERY DAY WITH BREAKFAST 90 tablet 3  . losartan-hydrochlorothiazide (HYZAAR) 100-12.5 MG tablet Take 1 tablet by mouth daily. 90 tablet 3  . metoprolol succinate (TOPROL-XL) 25 MG 24 hr tablet Take 1 tablet (25 mg total) by mouth daily. 90 tablet 3  . mirabegron ER (MYRBETRIQ) 25 MG TB24 tablet Take 1 tablet (25 mg total) by mouth daily. One po qd 30 tablet 2  . Multiple Vitamin (MULTIVITAMIN) tablet Take 1 tablet by mouth daily.    [place] Kitchen nystatin cream (MYCOSTATIN) Apply 1 application topically 2 (two) times daily. Apply to affected area BID for up to 7 days. 30 g 1  . pioglitazone (ACTOS) 45 MG tablet Take 1 tablet (45 mg total) by mouth daily. 90 tablet 3  . potassium chloride SA (K-DUR,KLOR-CON) 20 MEQ tablet Take 1 tablet (20 mEq total) by mouth daily as needed. 30 tablet 3  . rivaroxaban (XARELTO) 20 MG TABS tablet Take 1  tablet (20 mg total) by mouth daily with supper. 90 tablet 3   No current facility-administered medications on file prior to visit.      Family Hx: The patient's family history includes Diabetes in her father and another family member; Pancreatic cancer in her father.  ROS:   Please see the history of present illness.    Review of Systems  Constitutional: Negative.   HENT: Negative.   Respiratory: Negative.   Cardiovascular: Negative.   Gastrointestinal: Negative.   Musculoskeletal: Negative.   Neurological: Negative.   Psychiatric/Behavioral: Negative.   All other systems reviewed and are negative.    Labs/Other Tests and Data Reviewed:    Recent Labs: 10/03/2018: ALT 17; BUN 14; Creatinine, Ser 0.84; Hemoglobin 12.8; Platelets 226.0; Potassium 4.0; Sodium 137  Recent Lipid Panel Lab Results  Component Value Date/Time   CHOL 172 10/03/2018 09:30 AM   TRIG 122.0 10/03/2018 09:30 AM   HDL 48.90 10/03/2018 09:30 AM   CHOLHDL 4 10/03/2018 09:30 AM   LDLCALC 98 10/03/2018 09:30 AM    Wt Readings from Last 3 Encounters:  03/08/19 206 lb (93.4 kg)  11/19/18 210 lb 8 oz (95.5 kg)  10/25/18 212 lb 4 oz (96.3 kg)     Exam:    Vital Signs: Vital signs may also be detailed in the HPI LMP 09/26/1997   Wt Readings from Last 3 Encounters:  03/08/19 206 lb (93.4 kg)  11/19/18 210 lb 8 oz (95.5 kg)  10/25/18 212 lb 4 oz (96.3 kg)   Temp Readings from Last 3 Encounters:  03/08/19 97.6 F (36.4 C) (Temporal)  11/19/18 97.9 F (36.6 C) (Oral)  10/25/18 (!) 97.4 F (36.3 C) (Oral)   BP Readings from Last 3 Encounters:  03/08/19 130/66  11/19/18 120/60  10/25/18 140/80   Pulse Readings from Last 3 Encounters:  03/08/19 84  11/19/18 88  10/25/18 84     Well nourished, well developed female in no acute distress. Constitutional:  oriented to person, place, and time. No distress.    ASSESSMENT & PLAN:    PAF (paroxysmal atrial fibrillation) (HCC) - Stable, no  tachycardia On xarelto  Type 2 diabetes, controlled, with peripheral neuropathy ([place]) - Numbers are elevated Eating more, no exercise  Essential hypertension - Blood pressure is well controlled on today's visit. No changes made to the medications.  Gastroesophageal reflux disease without esophagitis - stable sx    COVID-19 Education: The signs and symptoms of COVID-19 were discussed with the patient and how to seek care for testing (follow up with PCP or arrange E-visit).  The importance of social distancing was discussed today.  Patient Risk:   After full review of this patients clinical status, I feel that they are at least moderate risk at this time.  Time:   Today, I have spent 25 minutes with the patient with telehealth technology discussing the cardiac and medical problems/diagnoses detailed above   10 min spent reviewing the chart prior to patient visit today   Medication Adjustments/Labs and Tests Ordered: Current medicines are reviewed at length with the patient today.  Concerns regarding medicines are outlined above.   Tests Ordered: No tests ordered   Medication Changes: No changes made   Disposition: Follow-up in 12 months   Signed, Ida Rogue, MD  03/12/2019 8:57 AM    [place] Office [address] [place] [address], [place], [place] [address]

## 2019-03-12 ENCOUNTER — Other Ambulatory Visit: Payer: Self-pay

## 2019-03-12 ENCOUNTER — Telehealth (INDEPENDENT_AMBULATORY_CARE_PROVIDER_SITE_OTHER): Payer: Medicare Other | Admitting: Cardiovascular Disease

## 2019-03-12 DIAGNOSIS — E119 Type 2 diabetes mellitus without complications: Secondary | ICD-10-CM

## 2019-03-12 DIAGNOSIS — K219 Gastro-esophageal reflux disease without esophagitis: Secondary | ICD-10-CM

## 2019-03-12 DIAGNOSIS — I480 Paroxysmal atrial fibrillation: Secondary | ICD-10-CM

## 2019-03-12 DIAGNOSIS — I10 Essential (primary) hypertension: Secondary | ICD-10-CM

## 2019-03-12 DIAGNOSIS — E1142 Type 2 diabetes mellitus with diabetic polyneuropathy: Secondary | ICD-10-CM

## 2019-03-12 NOTE — Patient Instructions (Signed)

## 2019-03-13 ENCOUNTER — Other Ambulatory Visit: Payer: Self-pay

## 2019-03-13 ENCOUNTER — Ambulatory Visit (INDEPENDENT_AMBULATORY_CARE_PROVIDER_SITE_OTHER): Payer: Medicare Other | Admitting: Orthopedic Surgery

## 2019-03-13 ENCOUNTER — Encounter: Payer: Self-pay | Admitting: Orthopedic Surgery

## 2019-03-13 DIAGNOSIS — S838X1A Sprain of other specified parts of right knee, initial encounter: Secondary | ICD-10-CM

## 2019-03-13 NOTE — Telephone Encounter (Signed)
Patient returning call.

## 2019-03-13 NOTE — Telephone Encounter (Signed)
Returned call to patient to scheduled recommended ultrasound. Left voicemail message requesting a return call

## 2019-03-14 NOTE — Telephone Encounter (Signed)
Patient is returning call to [facility].

## 2019-03-14 NOTE — Telephone Encounter (Signed)
Returned call to patient. Spoke with patient regarding benefit for an ultrasound. Patient understood and agreeable. Patient ready to schedule. Patient scheduled 03/28/2019 with Dr Sabra Heck. Patient is aware of appointment date, arrival time and cancellation policy. No further questions.   Forwarding to Dr Sabra Heck for final review. Patient is agreeable to disposition. Will close encounter

## 2019-03-15 ENCOUNTER — Encounter: Payer: Self-pay | Admitting: Orthopedic Surgery

## 2019-03-15 DIAGNOSIS — S838X1A Sprain of other specified parts of right knee, initial encounter: Secondary | ICD-10-CM

## 2019-03-15 MED ORDER — METHYLPREDNISOLONE ACETATE 40 MG/ML IJ SUSP
40.0000 mg | INTRAMUSCULAR | Status: AC | PRN
  Administered 2019-03-15: 40 mg via INTRA_ARTICULAR

## 2019-03-15 MED ORDER — BUPIVACAINE HCL 0.25 % IJ SOLN
4.0000 mL | INTRAMUSCULAR | Status: AC | PRN
  Administered 2019-03-15: 4 mL via INTRA_ARTICULAR

## 2019-03-15 MED ORDER — LIDOCAINE HCL 1 % IJ SOLN
5.0000 mL | INTRAMUSCULAR | Status: AC | PRN
  Administered 2019-03-15: 5 mL

## 2019-03-15 NOTE — Progress Notes (Signed)
Office Visit Note   Patient: Angela Bentley           Date of Birth: [birth year]           MRN: [id number] Visit Date: 03/13/2019 Requested by: [name], MD [place],  [place] [address] PCP: [name], MD  Subjective: Chief Complaint  Patient presents with  . Right Knee - Pain    HPI: Angela Bentley is a patient with right knee pain.  She has had an injection in the past.  She has a mild lateral meniscal tear on the right-hand side consistent with her age.  She did well with the previous injection.  She is now having some pain when she walks.  The pain will wake her from sleep at night.  She is like to consider repeat injection.  Brace does help her.  She starts physical therapy on Monday.              ROS: All systems reviewed are negative as they relate to the chief complaint within the history of present illness.  Patient denies  fevers or chills.   Assessment & Plan: Visit Diagnoses:  1. Injury of meniscus of right knee, initial encounter     Plan: Impression is right knee pain with lateral meniscal tear mild degenerative changes but no effusion.  Repeat injection performed today.  Start physical therapy Monday.  Not really having mechanical symptoms per se but more pain.  I think arthroscopy could be considered but in general she is done well with the injections.  We will check her back in about 3 to 4 months for repeat evaluation  Follow-Up Instructions: Return if symptoms worsen or fail to improve.   Orders:  No orders of the defined types were placed in this encounter.  No orders of the defined types were placed in this encounter.     Procedures: Large Joint Inj: R knee on 03/15/2019 8:50 AM Indications: diagnostic evaluation, joint swelling and pain Details: 18 G 1.5 in needle, superolateral approach  Arthrogram: No  Medications: 5 mL lidocaine 1 %; 40 mg methylPREDNISolone acetate 40 MG/ML; 4 mL bupivacaine 0.25 % Outcome: tolerated  well, no immediate complications Procedure, treatment alternatives, risks and benefits explained, specific risks discussed. Consent was given by the patient. Immediately prior to procedure a time out was called to verify the correct patient, procedure, equipment, support staff and site/side marked as required. Patient was prepped and draped in the usual sterile fashion.       Clinical Data: No additional findings.  Objective: Vital Signs: LMP 09/26/1997   Physical Exam:   Constitutional: Patient appears well-developed HEENT:  Head: Normocephalic Eyes:EOM are normal Neck: Normal range of motion Cardiovascular: Normal rate Pulmonary/chest: Effort normal Neurologic: Patient is alert Skin: Skin is warm Psychiatric: Patient has normal mood and affect    Ortho Exam: Ortho exam demonstrates full active and passive range of motion of the right knee with no effusion.  She does not have much in the way of tenderness over the iliotibial band.  Collaterals and cruciates are stable.  Pedal pulses palpable.  No masses lymphadenopathy or skin changes noted in that right knee region.  Does have very mild lateral joint line tenderness compared to the medial side.  Negative patellar apprehension.  Specialty Comments:  No specialty comments available.  Imaging: No results found.   PMFS History: Patient Active Problem List   Diagnosis Date Noted  . Dizziness   .  Tinnitus, bilateral 11/28/2017  . Abnormal MRI of head 11/14/2017  . Meningioma ([place]) 09/21/2017  . Ataxia 05/11/2017  . PAF (paroxysmal atrial fibrillation) ([place]) 02/03/2017  . Preventative health care 06/02/2016  . Advance directive discussed with patient 06/02/2016  . Achilles tendonitis 03/11/2016  . Hypertension   . DVT, lower extremity, recurrent ([place])   . Type 2 diabetes, controlled, with peripheral neuropathy ([place])   . Generalized osteoarthritis of multiple sites   . GERD (gastroesophageal reflux disease)   .  Hyperthyroidism   . Retinal hole of right eye 06/26/2014  . Amblyopia, right 05/08/2014  . Status post intraocular lens implant 05/08/2014   Past Medical History:  Diagnosis Date  . Balance problems   . Brain tumor ([place])    a. Left intraventricular tumor ->stable by 06/2017 MRI. Followed @ [facility].  . Gait disturbance    a. Unsteady on feet/balance difficulty.  . Generalized osteoarthritis of multiple sites   . GERD (gastroesophageal reflux disease)   . History of DVT (deep vein thrombosis) 2016   estrogen and airflights. Rx with xarelto for 3 months  . History of stress test    a. 11/2016 ETT: No ST/T changes.  Performed to assess PAC/PVC burden with activity ->No ectopy noted.  . Hypertension   . Hyperthyroidism    treated briefly in college  . Insomnia   . Obesity   . PAF (paroxysmal atrial fibrillation) ([place])    a. 12/2016 Event Monitor: brief run of PAF-->CHA2DS2VASc = 5--> Xarelto.  . Plantar fasciitis   . Retinal tear of left eye   . Rosacea   . Type 2 diabetes, controlled, with peripheral neuropathy ([place])    a. 11/2016 A1c 7.4.  . Uterine fibroid     Family History  Problem Relation Age of Onset  . Diabetes Father   . Pancreatic cancer Father   . Diabetes Other        grandmother    Past Surgical History:  Procedure Laterality Date  . CARPAL TUNNEL RELEASE Right    Dr Burney Gauze  . CATARACT EXTRACTION    . RETINAL TEAR REPAIR CRYOTHERAPY     10/09/13, then again 3/15   Social History   Occupational History  . Occupation: Non Patent attorney    Comment: [employer] and others  Tobacco Use  . Smoking status: Former Smoker    Quit date: 09/26/1993    Years since quitting: 25.4  . Smokeless tobacco: Never Used  . Tobacco comment: quit in 1996  Substance and Sexual Activity  . Alcohol use: Yes    Alcohol/week: 0.0 - 1.0 standard drinks  . Drug use: No  . Sexual activity: Not Currently    Partners: Male

## 2019-03-18 ENCOUNTER — Other Ambulatory Visit: Payer: Self-pay

## 2019-03-18 ENCOUNTER — Encounter: Payer: Self-pay | Admitting: Physical Therapy

## 2019-03-18 ENCOUNTER — Ambulatory Visit: Payer: Medicare Other | Attending: Obstetrics and Gynecology | Admitting: Physical Therapy

## 2019-03-18 DIAGNOSIS — M6281 Muscle weakness (generalized): Secondary | ICD-10-CM | POA: Diagnosis not present

## 2019-03-18 DIAGNOSIS — R2681 Unsteadiness on feet: Secondary | ICD-10-CM | POA: Diagnosis not present

## 2019-03-18 DIAGNOSIS — M25561 Pain in right knee: Secondary | ICD-10-CM

## 2019-03-19 NOTE — Therapy (Signed)
[place] MAIN [facility] SERVICES [place], [place], [address] Phone: [phone]   Fax:  [phone]  Physical Therapy Re Evaluation  Patient Details  Name: Angela Bentley MRN: [id number] Date of Birth: 27-Jan-1941 Referring Provider (PT): Dr. [name] PCP   Encounter Date: 03/18/2019  PT End of Session - 03/19/19 0814    Visit Number  5    Number of Visits  22    Date for PT Re-Evaluation  05/14/19    Authorization Type  goals last re-assessed 03/18/19    PT Start Time  0840    PT Stop Time  0935    PT Time Calculation (min)  55 min    Activity Tolerance  Patient tolerated treatment well    Behavior During Therapy  [facility] for tasks assessed/performed       Past Medical History:  Diagnosis Date  . Balance problems   . Brain tumor ([place])    a. Left intraventricular tumor ->stable by 06/2017 MRI. Followed @ [facility].  . Gait disturbance    a. Unsteady on feet/balance difficulty.  . Generalized osteoarthritis of multiple sites   . GERD (gastroesophageal reflux disease)   . History of DVT (deep vein thrombosis) 2016   estrogen and airflights. Rx with xarelto for 3 months  . History of stress test    a. 11/2016 ETT: No ST/T changes.  Performed to assess PAC/PVC burden with activity ->No ectopy noted.  . Hypertension   . Hyperthyroidism    treated briefly in college  . Insomnia   . Obesity   . PAF (paroxysmal atrial fibrillation) ([place])    a. 12/2016 Event Monitor: brief run of PAF-->CHA2DS2VASc = 5--> Xarelto.  . Plantar fasciitis   . Retinal tear of left eye   . Rosacea   . Type 2 diabetes, controlled, with peripheral neuropathy ([place])    a. 11/2016 A1c 7.4.  . Uterine fibroid     Past Surgical History:  Procedure Laterality Date  . CARPAL TUNNEL RELEASE Right    Dr Burney Gauze  . CATARACT EXTRACTION    . RETINAL TEAR REPAIR CRYOTHERAPY     10/09/13, then again 3/15    There were no vitals filed for this visit.   Subjective  Assessment - 03/18/19 0843    Subjective  "My goal is to get back to walking and being active again. I have to turn this around."    Pertinent History  78 yo Female continues to have right knee pain; She went to orthopedic and had a repeat cortisone injection for right lateral meniscus tear. She reports injection was successful in that she is not having pain with weight bearing in right knee; She is still having right IT band pain; Pt reports that she should not need surgery at this time; She is still having clicking in both knees. She did get shoe inserts but states that they are not working well. She incrementally increased wear and states that they still bothered her. She reports that she hasn't been walking or exercising due to pain and fatigue; She reports not sleeping well and states that when she doesn't sleep well she is more unsteady and having difficulty with her balance; She is having left ankle cramping as a result of new inserts; She reports the pain is worse in thigh and knees after wearing inserts for longer period of time. She feels like it throws off her balance and is exacerbating burning pain in legs; She also reports increased  back pain;    Limitations  Standing;Walking    How long can you stand comfortably?  30 min with fatigue;    How long can you walk comfortably?  less than 1/2 a mile    Patient Stated Goals  "Be able to walk a mile a day."    Currently in Pain?  Yes    Pain Score  1     Pain Location  Knee    Pain Orientation  Right;Lateral    Pain Descriptors / Indicators  Aching;Sore    Pain Type  Chronic pain    Pain Onset  More than a month ago    Pain Frequency  Intermittent    Aggravating Factors   worse at night; prolonged standing/walking    Pain Relieving Factors  rest; hasn't tried heat/ice    Effect of Pain on Daily Activities  decreased activity tolerance;         [facility] PT Assessment - 03/19/19 0001      Assessment   Medical Diagnosis  right knee pain     Referring Provider (PT)  Dr. Edilia Bo    Onset Date/Surgical Date  10/20/18    Hand Dominance  Right    Prior Therapy  had PT for right plantar fasciitis; started therapy for right knee pain in February 2020 but had to stop in March due to COVID 19; is now returning for right knee pain      Precautions   Precautions  Fall      Restrictions   Weight Bearing Restrictions  No      Balance Screen   Has the patient fallen in the past 6 months  No    Has the patient had a decrease in activity level because of a fear of falling?   No    Is the patient reluctant to leave their home because of a fear of falling?   No      Home Environment   Additional Comments  lives in single story independent living community with husband, mod I for self care ADLs      Prior Function   Level of Independence  Independent    Vocation  Retired    Leisure  likes to walk dog and socialize      Cognition   Overall Cognitive Status  Within Functional Limits for tasks assessed      Observation/Other Assessments   Observations  very pleasant woman; complains of increased swelling in BLE thigh; no swelling noted in lower leg;     Skin Integrity  grossly intact      Sensation   Light Touch  Appears Intact      Coordination   Gross Motor Movements are Fluid and Coordinated  Yes    Fine Motor Movements are Fluid and Coordinated  Yes      Posture/Postural Control   Posture Comments  in standing patient exhibits equal hip height; when walking she exhibits slight lean to right side unsure if this is related to left lumbar pain or imbalance      AROM   Overall AROM Comments  BUE and BLE are [facility]      Strength   Overall Strength Comments  BUE and BLE are WFL with exception of: Hip abduction 4/5, knee: flexion: 4/5, extension: 4/5 on RLE       Palpation   Patella mobility  slightly hypomobile on RLE as compared to LLE; reports pain with inferior glide on RLE with slight clicking noted;  Palpation comment   reports moderate tenderness along right lateral knee and along right IT band.       Ambulation/Gait   Gait Comments  pt ambulates with wider base of support, reciprocal gait pattern, no AD, slight antalgic gait on right with short stance time and decreased step length on left. Patient exhibits less foot pronation on right side with shoe inserts,, however inserts on left foot increase foot supination increasing weight bearing on lateral edge of left foot. Pt exhibits slight lean to right side unsure if this is related to left low back pain or imbalance;      Standardized Balance Assessment   10 Meter Walk  14 sec without AD (0.71 m/s without AD) limited home ambulator                Objective measurements completed on examination: See above findings.              PT Education - 03/19/19 0813    Education Details  recommendation/plan of care    Person(s) Educated  Patient    Methods  Explanation    Comprehension  Verbalized understanding       PT Short Term Goals - 03/19/19 8563      PT SHORT TERM GOAL #1   Title  Patient will be adherent to HEP at least 3x a week to improve functional strength and balance for better safety at home.    Time  4    Period  Weeks    Status  New    Target Date  04/16/19      PT SHORT TERM GOAL #2   Title  Patient will increase RLE knee AROM extension to within 5 degrees of neutral for better standing and walking tolerance;     Time  4    Period  Weeks    Status  New    Target Date  04/16/19      PT SHORT TERM GOAL #3   Title  Patient will reports maximum of 1 sleep disturbances per night over last 3 days to exhibit improved sleeping tolerance with less knee pain and reduced fatigue.     Time  4    Period  Weeks    Status  New    Target Date  04/16/19        PT Long Term Goals - 03/19/19 0834      PT LONG TERM GOAL #1   Title  Patient will increase BLE gross strength to 4+/5 as to improve functional strength for  independent gait, increased standing tolerance and increased ADL ability.    Time  8    Period  Weeks    Status  New    Target Date  05/14/19      PT LONG TERM GOAL #2   Title  Patient will increase lower extremity functional scale to >60/80 to demonstrate improved functional mobility and increased tolerance with ADLs.     Time  8    Period  Weeks    Status  New    Target Date  05/14/19      PT LONG TERM GOAL #3   Title  Patient will report a worst pain of 3/10 on VAS in      right knee       to improve tolerance with ADLs and reduced symptoms with activities.     Time  8    Period  Weeks    Status  New  Target Date  05/14/19      PT LONG TERM GOAL #4   Title  Patient will complete >1000 feet on 6 min walk test without increase in knee pain to improve community ambulator distance for functional tasks and return to PLOF.    Time  8    Period  Weeks    Status  New    Target Date  05/14/19             Plan - 03/19/19 0815    Clinical Impression Statement  78 yo Female returns to physical therapy with continued right knee pain. Patient had started outpatient PT in February for right knee pain due to possible meniscus tear. However therapy was stopped in March 2020 due to [place] 19. Patient did exhibit increased right foot pronation which could be contributing to possible patellofemoral pain. She did obtain custom inserts which has helped reduce pronation but patient reports she had difficulty wearing them as they caused increased proximal leg discomfort. Patient has a follow up with orthotist this week to get orthotics adjusted. Patient reports overall decreased mobility and concern over difficulty walking. She denies any new falls. She reports increased swelling in proximal thigh but reports lower leg has improved. Patient ambulates with slower gait speed, slight antalgic gait with increased trunk lean. Unsure if trunk lean is related to new onset left sided low back pain from  orthotics or if it is related to imbalance. Patient would benefit from skilled PT intervention to improve strength and mobilitiy while reducing right knee discomfort.    Personal Factors and Comorbidities  Age;Comorbidity 2;Fitness;Time since onset of injury/illness/exacerbation    Comorbidities  diabetes, imbalance    Examination-Activity Limitations  Squat;Lift;Stairs;Bend;Locomotion Level;Stand;Transfers;Sleep    Examination-Participation Restrictions  Cleaning;Community Activity;Shop;Church;Yard Work;Volunteer    Stability/Clinical Decision Making  Evolving/Moderate complexity    Clinical Decision Making  Moderate    Clinical Presentation due to:  RLE knee pain which is radiating into right thigh and up to proximal hip;    Rehab Potential  Good    Clinical Impairments Affecting Rehab Potential  motivated, good PLOF and good caregiver support    PT Frequency  2x / week    PT Duration  8 weeks    PT Treatment/Interventions  Aquatic Therapy;Cryotherapy;Electrical Stimulation;Moist Heat;Ultrasound;Gait training;Stair training;Functional mobility training;Therapeutic activities;Therapeutic exercise;Balance training;Neuromuscular re-education;Patient/family education;Orthotic Fit/Training;Manual techniques;Passive range of motion;Energy conservation;Taping    PT Next Visit Plan  --    PT Home Exercise Plan  continue as previously given;    Consulted and Agree with Plan of Care  Patient       Patient will benefit from skilled therapeutic intervention in order to improve the following deficits and impairments:  Abnormal gait, Decreased activity tolerance, Pain, Difficulty walking, Decreased mobility, Decreased balance, Decreased range of motion, Impaired flexibility, Hypomobility, Decreased strength  Visit Diagnosis: 1. Acute pain of right knee   2. Muscle weakness (generalized)   3. Unsteadiness on feet        Problem List Patient Active Problem List   Diagnosis Date Noted  . Dizziness    . Tinnitus, bilateral 11/28/2017  . Abnormal MRI of head 11/14/2017  . Meningioma ([place]) 09/21/2017  . Ataxia 05/11/2017  . PAF (paroxysmal atrial fibrillation) ([place]) 02/03/2017  . Preventative health care 06/02/2016  . Advance directive discussed with patient 06/02/2016  . Achilles tendonitis 03/11/2016  . Hypertension   . DVT, lower extremity, recurrent ([place])   . Type 2 diabetes, controlled, with peripheral neuropathy ([place])   .  Generalized osteoarthritis of multiple sites   . GERD (gastroesophageal reflux disease)   . Hyperthyroidism   . Retinal hole of right eye 06/26/2014  . Amblyopia, right 05/08/2014  . Status post intraocular lens implant 05/08/2014    Yvana Samonte PT, DPT 03/19/2019, 8:35 AM  [place] MAIN [facility] SERVICES [address] [place], [place], [address] Phone: [phone]   Fax:  [phone]  Name: Angela Bentley MRN: [id number] Date of Birth: 13-Jun-1941

## 2019-03-20 ENCOUNTER — Ambulatory Visit: Payer: Medicare Other | Admitting: Physical Therapy

## 2019-03-20 ENCOUNTER — Other Ambulatory Visit: Payer: Self-pay | Admitting: Neurosurgery

## 2019-03-20 DIAGNOSIS — D329 Benign neoplasm of meninges, unspecified: Secondary | ICD-10-CM

## 2019-03-24 ENCOUNTER — Other Ambulatory Visit: Payer: Self-pay

## 2019-03-24 ENCOUNTER — Emergency Department
Admission: EM | Admit: 2019-03-24 | Discharge: 2019-03-24 | Disposition: A | Discharge status: Home / Self Care | Payer: Medicare Other | Attending: Emergency Medicine | Admitting: Emergency Medicine

## 2019-03-24 DIAGNOSIS — R350 Frequency of micturition: Secondary | ICD-10-CM | POA: Insufficient documentation

## 2019-03-24 DIAGNOSIS — R109 Unspecified abdominal pain: Secondary | ICD-10-CM | POA: Insufficient documentation

## 2019-03-24 DIAGNOSIS — M545 Low back pain: Secondary | ICD-10-CM | POA: Diagnosis not present

## 2019-03-24 DIAGNOSIS — Z5321 Procedure and treatment not carried out due to patient leaving prior to being seen by health care provider: Secondary | ICD-10-CM | POA: Insufficient documentation

## 2019-03-24 LAB — BASIC METABOLIC PANEL
Anion gap: 12 (ref 5–15)
BUN: 15 mg/dL (ref 8–23)
CO2: 27 mmol/L (ref 22–32)
Calcium: 9.3 mg/dL (ref 8.9–10.3)
Chloride: 100 mmol/L (ref 98–111)
Creatinine, Ser: 0.72 mg/dL (ref 0.44–1.00)
GFR calc Af Amer: >60 mL/min (ref >60)
GFR calc non Af Amer: >60 mL/min (ref >60)
Glucose, Bld: 144 mg/dL — ABNORMAL HIGH (ref 70–99)
Potassium: 3.4 mmol/L — ABNORMAL LOW (ref 3.5–5.1)
Sodium: 139 mmol/L (ref 135–145)

## 2019-03-24 LAB — CBC
HCT: 38.1 % (ref 36.0–46.0)
Hemoglobin: 12.5 g/dL (ref 12.0–15.0)
MCH: 29.4 pg (ref 26.0–34.0)
MCHC: 32.8 g/dL (ref 30.0–36.0)
MCV: 89.6 fL (ref 80.0–100.0)
Platelets: 260 10*3/uL (ref 150–400)
RBC: 4.25 MIL/uL (ref 3.87–5.11)
RDW: 13 % (ref 11.5–15.5)
WBC: 10 10*3/uL (ref 4.0–10.5)
nRBC: 0 % (ref 0.0–0.2)

## 2019-03-24 LAB — URINALYSIS, COMPLETE (UACMP) WITH MICROSCOPIC
Bilirubin Urine: NEGATIVE
Glucose, UA: NEGATIVE mg/dL
Hgb urine dipstick: NEGATIVE
Ketones, ur: NEGATIVE mg/dL
Nitrite: NEGATIVE
Protein, ur: NEGATIVE mg/dL
Specific Gravity, Urine: 1.011 (ref 1.005–1.030)
pH: 5 (ref 5.0–8.0)

## 2019-03-24 NOTE — ED Triage Notes (Signed)
Pt c/o left lower to mid back/flank pain since Friday, states she has been getting up multiple times during the night to urinate. Denies any N/V/d/fever.

## 2019-03-25 ENCOUNTER — Telehealth: Payer: Self-pay

## 2019-03-25 NOTE — Telephone Encounter (Signed)
Per chart review tab pt went to [facility] ED on 03/24/19.

## 2019-03-25 NOTE — Telephone Encounter (Signed)
See My Chart Message.

## 2019-03-25 NOTE — Telephone Encounter (Signed)
I see a triage note but no further assessment Please see how she is doing

## 2019-03-25 NOTE — Telephone Encounter (Signed)
[place] Night - Client TELEPHONE ADVICE RECORD [facility] Patient Name: Angela Bentley Gender: Female DOB: 1941-08-07 Age: 78 Y 64 M 13 D Return Phone Number: [phone] (Primary), [phone] (Secondary) Address: City/State/Zip: [place] [address] Client [facility] Night - Client Client Site [place] Physician Viviana Simpler - MD Contact Type Call Who Is Calling Patient / Member / Family / Caregiver Call Type Triage / Clinical Relationship To Patient Self Return Phone Number [phone] (Secondary) Chief Complaint Flank Pain Reason for Call Symptomatic / Request for [place] states thinks she may have a kidney stone. Hard to get out of bed. Having pain left side waist pain. Translation No Nurse Assessment Nurse: London Pepper, RN, Jeneen Rinks Date/Time Eilene Ghazi Time): 03/24/2019 5:25:14 PM Confirm and document reason for call. If symptomatic, describe symptoms. ---Caller states thinks she may have a kidney stone. Hard to get out of bed. Having L flank pain. States it started Friday night. States she tried to go to [place] portal, but was unable to get in. Has the patient had close contact with a person known or suspected to have the novel coronavirus illness OR traveled / lives in area with major community spread (including international travel) in the last 14 days from the onset of symptoms? * If Asymptomatic, screen for exposure and travel within the last 14 days. ---No Does the patient have any new or worsening symptoms? ---Yes Will a triage be completed? ---Yes Related visit to physician within the last 2 weeks? ---No Does the PT have any chronic conditions? (i.e. diabetes, asthma, this includes High risk factors for pregnancy, etc.) ---Yes List chronic conditions. ---Diabetes, HTN Is this a behavioral health or substance abuse call?  ---No Guidelines Guideline Title Affirmed Question Affirmed Notes Nurse Date/Time Eilene Ghazi Time) Flank Pain [1] Sudden onset of severe flank pain AND [2] age > 29 London Pepper, Rhoderick Moody 03/24/2019 5:27:31 PM PLEASE NOTE: All timestamps contained within this report are represented as [place] Standard Time. CONFIDENTIALTY NOTICE: This fax transmission is intended only for the addressee. It contains information that is legally privileged, confidential or otherwise protected from use or disclosure. If you are not the intended recipient, you are strictly prohibited from reviewing, disclosing, copying using or disseminating any of this information or taking any action in reliance on or regarding this information. If you have received this fax in error, please notify us immediately by telephone so that we can arrange for its return to [place]. Phone: [phone], Toll-Free: [phone], Fax: [phone] Page: 2 of 2 Call Id: [id number] [place]. Time Eilene Ghazi Time) Disposition Final User 03/24/2019 5:01:01 PM Attempt made - message left Sondra Come 03/24/2019 5:29:17 PM Go to ED Now Yes London Pepper, RN, Martin Majestic Disagree/Comply Comply Caller Understands Yes PreDisposition Did not know what to do Care Advice Given Per Guideline * Leave now. Drive carefully. * Go to the ED at ___________ Hospital. * You need to be seen in the Emergency Department. GO TO ED NOW: CARE ADVICE given per Flank Pain (Adult) guideline. DRIVING: Another adult should drive. * Please bring a list of your current medicines when you go to the Emergency Department (ER). BRING MEDICINES: Referrals [facility] - ED

## 2019-03-28 ENCOUNTER — Ambulatory Visit (INDEPENDENT_AMBULATORY_CARE_PROVIDER_SITE_OTHER): Payer: Medicare Other | Admitting: Internal Medicine

## 2019-03-28 ENCOUNTER — Other Ambulatory Visit: Payer: Self-pay

## 2019-03-28 ENCOUNTER — Encounter: Payer: Self-pay | Admitting: Obstetrics & Gynecology

## 2019-03-28 ENCOUNTER — Ambulatory Visit (INDEPENDENT_AMBULATORY_CARE_PROVIDER_SITE_OTHER): Payer: Medicare Other

## 2019-03-28 ENCOUNTER — Ambulatory Visit (INDEPENDENT_AMBULATORY_CARE_PROVIDER_SITE_OTHER): Payer: Medicare Other | Admitting: Obstetrics & Gynecology

## 2019-03-28 ENCOUNTER — Encounter: Payer: Self-pay | Admitting: Internal Medicine

## 2019-03-28 VITALS — BP 144/76 | HR 83 | Temp 97.8°F | Wt 203.0 lb

## 2019-03-28 VITALS — BP 150/80 | HR 88 | Temp 97.0°F | Ht 66.0 in | Wt 204.0 lb

## 2019-03-28 DIAGNOSIS — K5900 Constipation, unspecified: Secondary | ICD-10-CM

## 2019-03-28 DIAGNOSIS — S335XXA Sprain of ligaments of lumbar spine, initial encounter: Secondary | ICD-10-CM | POA: Insufficient documentation

## 2019-03-28 DIAGNOSIS — R140 Abdominal distension (gaseous): Secondary | ICD-10-CM | POA: Diagnosis not present

## 2019-03-28 DIAGNOSIS — E1142 Type 2 diabetes mellitus with diabetic polyneuropathy: Secondary | ICD-10-CM

## 2019-03-28 LAB — POCT GLYCOSYLATED HEMOGLOBIN (HGB A1C): Hemoglobin A1C: 7.4 % — AB (ref 4.0–5.6)

## 2019-03-28 NOTE — Assessment & Plan Note (Addendum)
Hopefully still good control Discussed fitness She needs to check a little more regularly  Lab Results  Component Value Date   HGBA1C 7.4 (A) 03/28/2019   Still acceptable control

## 2019-03-28 NOTE — Progress Notes (Signed)
78 y.o. G5P3 Married White or Caucasian female here for pelvic ultrasound due to constipation and bladder changes that have occurred over the last several months.  She has not had any vaginal bleeding.    Patient's last menstrual period was 09/26/1997.  Contraception: PMP  Findings:  UTERUS: 5.5 x 5.3 x 4.3cm with three fibroids measuring 0.5cm, 3.2cm and 2.1cm EMS:4.42mm, summetrical ADNEXA: Left ovary: 1.3 x 1.0 x 0.8 with 1.3 x 1.0 x 1.3cm simple cys, possibly ovarian and possibly tubal       Right ovary: 0.6 x 0.7 x 0.6 CUL DE SAC: no free fluid  Discussion:  Findings reviewed with pt including ovarian cyst/paratubal cyst as well as fibroids.  She is not have any bleeding.  She has not started the Myrbetriq.  Reports she had an issue with back pain earlier this week.  This has resolved.  She is seeing Dr. [name] this afternoon for follow-up.  Assessment:  Constipation OAB Small ovarian cyst Uterine fibroids  Plan:  Follow up ultrasound is not needed at this time. She is going to start the Myrbetriq 25mg  daily and she is going to monitor her blood pressure when she starts it to make sure that it does not increase.  She is going to call and give me an update about symptoms after she starts the medication.  ~15 minutes spent with patient >50% of time was in face to face discussion of above.

## 2019-03-28 NOTE — Progress Notes (Signed)
Subjective:    Patient ID: Angela Bentley, female    DOB: 1940/12/13, 78 y.o.   MRN: [id number]  HPI Here mostly due to back pain---had waited in ER but was never seen and then see our emails  Pain started in the middle of the night a week ago By 2 days later---not gone Worried about a kidney stone---but never had before Waited hours--labs checked--but never seen  By the next day--pain seemed more in the middle of the back and lower Then decreased again by the next day Mostly gone by tomorrow  Seemed "sharper than muscle pain" No clear injury--but did sleep twisted around the dog  Not really checking sugars Highest measurement 160--but in late afternoon No hypoglycemic reactions Feet are not bothering her  Current Outpatient Medications on File Prior to Visit  Medication Sig Dispense Refill  . celecoxib (CELEBREX) 200 MG capsule TAKE 1 CAPSULE BY MOUTH ONCE DAILY AS NEEDED FOR MILD TO MODERATE PAIN 30 capsule 5  . diclofenac sodium (VOLTAREN) 1 % GEL Apply 4 g topically 4 (four) times daily. (Patient taking differently: Apply 4 g topically 4 (four) times daily as needed. ) 100 g 2  . fluticasone (VERAMYST) 27.5 MCG/SPRAY nasal spray Place 2 sprays into the nose daily as needed for allergies.     . furosemide (LASIX) 20 MG tablet Take 1 tablet (20 mg total) by mouth daily as needed. 30 tablet 3  . glimepiride (AMARYL) 1 MG tablet TAKE 1 TABLET EVERY DAY WITH BREAKFAST 90 tablet 3  . losartan-hydrochlorothiazide (HYZAAR) 100-12.5 MG tablet Take 1 tablet by mouth daily. 90 tablet 3  . metoprolol succinate (TOPROL-XL) 25 MG 24 hr tablet Take 1 tablet (25 mg total) by mouth daily. 90 tablet 3  . Multiple Vitamin (MULTIVITAMIN) tablet Take 1 tablet by mouth daily.    Marland Kitchen nystatin cream (MYCOSTATIN) Apply 1 application topically 2 (two) times daily. Apply to affected area BID for up to 7 days. (Patient taking differently: Apply 1 application topically 2 (two) times daily. Apply to  affected area BID PRN) 30 g 1  . pioglitazone (ACTOS) 45 MG tablet Take 1 tablet (45 mg total) by mouth daily. 90 tablet 3  . potassium chloride SA (K-DUR,KLOR-CON) 20 MEQ tablet Take 1 tablet (20 mEq total) by mouth daily as needed. 30 tablet 3  . rivaroxaban (XARELTO) 20 MG TABS tablet Take 1 tablet (20 mg total) by mouth daily with supper. 90 tablet 3  . mirabegron ER (MYRBETRIQ) 25 MG TB24 tablet Take 1 tablet (25 mg total) by mouth daily. One po qd (Patient not taking: Reported on 03/28/2019) 30 tablet 2   No current facility-administered medications on file prior to visit.     Allergies  Allergen Reactions  . Penicillins     Has patient had a PCN reaction causing immediate rash, facial/tongue/throat swelling, SOB or lightheadedness with hypotension: Yes Has patient had a PCN reaction causing severe rash involving mucus membranes or skin necrosis: Yes Has patient had a PCN reaction that required hospitalization No Has patient had a PCN reaction occurring within the last 10 years: No If all of the above answers are "NO", then may proceed with Cephalosporin use.    . Sulfa Antibiotics     Rash/itching  . Azithromycin Palpitations    Past Medical History:  Diagnosis Date  . Balance problems   . Brain tumor ([place])    a. Left intraventricular tumor ->stable by 06/2017 MRI. Followed @ [facility].  . Gait disturbance  a. Unsteady on feet/balance difficulty.  . Generalized osteoarthritis of multiple sites   . GERD (gastroesophageal reflux disease)   . History of DVT (deep vein thrombosis) 2016   estrogen and airflights. Rx with xarelto for 3 months  . History of stress test    a. 11/2016 ETT: No ST/T changes.  Performed to assess PAC/PVC burden with activity ->No ectopy noted.  . Hypertension   . Hyperthyroidism    treated briefly in college  . Insomnia   . Obesity   . PAF (paroxysmal atrial fibrillation) ([place])    a. 12/2016 Event Monitor: brief run of PAF-->CHA2DS2VASc = 5--> Xarelto.   . Plantar fasciitis   . Retinal tear of left eye   . Rosacea   . Type 2 diabetes, controlled, with peripheral neuropathy ([place])    a. 11/2016 A1c 7.4.  . Uterine fibroid     Past Surgical History:  Procedure Laterality Date  . CARPAL TUNNEL RELEASE Right    Dr Burney Gauze  . CATARACT EXTRACTION    . RETINAL TEAR REPAIR CRYOTHERAPY     10/09/13, then again 3/15    Family History  Problem Relation Age of Onset  . Diabetes Father   . Pancreatic cancer Father   . Diabetes Other        grandmother    Social History   Socioeconomic History  . Marital status: Married    Spouse name: Not on file  . Number of children: 3  . Years of education: Not on file  . Highest education level: Not on file  Occupational History  . Occupation: Non Patent attorney    Comment: [employer] and others  Social Needs  . Financial resource strain: Not on file  . Food insecurity    Worry: Not on file    Inability: Not on file  . Transportation needs    Medical: Not on file    Non-medical: Not on file  Tobacco Use  . Smoking status: Former Smoker    Quit date: 09/26/1993    Years since quitting: 25.5  . Smokeless tobacco: Never Used  . Tobacco comment: quit in 1996  Substance and Sexual Activity  . Alcohol use: Yes    Alcohol/week: 0.0 - 1.0 standard drinks  . Drug use: No  . Sexual activity: Not Currently    Partners: Male  Lifestyle  . Physical activity    Days per week: Not on file    Minutes per session: Not on file  . Stress: Not on file  Relationships  . Social Herbalist on phone: Not on file    Gets together: Not on file    Attends religious service: Not on file    Active member of club or organization: Not on file    Attends meetings of clubs or organizations: Not on file    Relationship status: Not on file  . Intimate partner violence    Fear of current or ex partner: Not on file    Emotionally abused: Not on file    Physically abused: Not on file    Forced sexual  activity: Not on file  Other Topics Concern  . Not on file  Social History Narrative   Has living will   Husband is health care POA-- or daughter Izora Gala   Would accept resuscitation attempts   Would not want tube feeds if cognitively unaware   Review of Systems Plantar fasciitis has been an issue Some knee pain--back in therapy and working  on new orthotics (still adjusting)    Objective:   Physical Exam  Constitutional: She appears well-developed. No distress.  Neck: No thyromegaly present.  Cardiovascular: Normal rate, regular rhythm and normal heart sounds. Exam reveals no gallop.  No murmur heard. Respiratory: Effort normal and breath sounds normal. No respiratory distress. She has no wheezes. She has no rales.  Musculoskeletal:     Comments: No spine tenderness No muscle tenderness SLR negative  Lymphadenopathy:    She has no cervical adenopathy.  Neurological:  Fairly normal gait No leg weakness           Assessment & Plan:

## 2019-03-28 NOTE — Assessment & Plan Note (Signed)
Not sure how it happened but is better No action  Discussed fitness, etc

## 2019-04-01 ENCOUNTER — Ambulatory Visit: Payer: Medicare Other | Attending: Obstetrics and Gynecology | Admitting: Physical Therapy

## 2019-04-01 ENCOUNTER — Encounter: Payer: Self-pay | Admitting: Physical Therapy

## 2019-04-01 ENCOUNTER — Other Ambulatory Visit: Payer: Self-pay

## 2019-04-01 DIAGNOSIS — R262 Difficulty in walking, not elsewhere classified: Secondary | ICD-10-CM | POA: Insufficient documentation

## 2019-04-01 DIAGNOSIS — R2681 Unsteadiness on feet: Secondary | ICD-10-CM

## 2019-04-01 DIAGNOSIS — M25561 Pain in right knee: Secondary | ICD-10-CM | POA: Diagnosis not present

## 2019-04-01 DIAGNOSIS — M79672 Pain in left foot: Secondary | ICD-10-CM | POA: Insufficient documentation

## 2019-04-01 DIAGNOSIS — M79671 Pain in right foot: Secondary | ICD-10-CM | POA: Diagnosis not present

## 2019-04-01 DIAGNOSIS — M6281 Muscle weakness (generalized): Secondary | ICD-10-CM | POA: Diagnosis not present

## 2019-04-01 NOTE — Therapy (Signed)
[place] MAIN [facility] SERVICES [address] [place], [place], [address] Phone: [phone]   Fax:  [phone]  Physical Therapy Treatment  Patient Details  Name: Angela Bentley MRN: [id number] Date of Birth: 28-Jun-1941 Referring Provider (PT): Dr. [name] PCP   Encounter Date: 04/01/2019  PT End of Session - 04/01/19 1103    Visit Number  6    Number of Visits  22    Date for PT Re-Evaluation  05/14/19    Authorization Type  goals last re-assessed 03/18/19    PT Start Time  0847    PT Stop Time  0935    PT Time Calculation (min)  48 min    Activity Tolerance  Patient tolerated treatment well;No increased pain    Behavior During Therapy  WFL for tasks assessed/performed       Past Medical History:  Diagnosis Date  . Balance problems   . Brain tumor ([place])    a. Left intraventricular tumor ->stable by 06/2017 MRI. Followed @ [facility].  . Gait disturbance    a. Unsteady on feet/balance difficulty.  . Generalized osteoarthritis of multiple sites   . GERD (gastroesophageal reflux disease)   . History of DVT (deep vein thrombosis) 2016   estrogen and airflights. Rx with xarelto for 3 months  . History of stress test    a. 11/2016 ETT: No ST/T changes.  Performed to assess PAC/PVC burden with activity ->No ectopy noted.  . Hypertension   . Hyperthyroidism    treated briefly in college  . Insomnia   . Obesity   . PAF (paroxysmal atrial fibrillation) ([place])    a. 12/2016 Event Monitor: brief run of PAF-->CHA2DS2VASc = 5--> Xarelto.  . Plantar fasciitis   . Retinal tear of left eye   . Rosacea   . Type 2 diabetes, controlled, with peripheral neuropathy ([place])    a. 11/2016 A1c 7.4.  . Uterine fibroid     Past Surgical History:  Procedure Laterality Date  . CARPAL TUNNEL RELEASE Right    Dr Burney Gauze  . CATARACT EXTRACTION    . RETINAL TEAR REPAIR CRYOTHERAPY     10/09/13, then again 3/15    There were no vitals filed for this  visit.  Subjective Assessment - 04/01/19 0851    Subjective  Patient reports episode of severe left sided low back pain and then it resolved on its own. She reports that it is better now; She is unsure what caused it. She reports that she is concerned the pain is coming from her shows and custom inserts.She has been a little fatigued from muscle relaxer which has affected her balance; she did see the orthotist and had her orthotics adjusted;    Pertinent History  78 yo Female continues to have right knee pain; She went to orthopedic and had a repeat cortisone injection for right lateral meniscus tear. She reports injection was successful in that she is not having pain with weight bearing in right knee; She is still having right IT band pain; Pt reports that she should not need surgery at this time; She is still having clicking in both knees. She did get shoe inserts but states that they are not working well. She incrementally increased wear and states that they still bothered her. She reports that she hasn't been walking or exercising due to pain and fatigue; She reports not sleeping well and states that when she doesn't sleep well she is more unsteady and having difficulty with  her balance; She is having left ankle cramping as a result of new inserts; She reports the pain is worse in thigh and knees after wearing inserts for longer period of time. She feels like it throws off her balance and is exacerbating burning pain in legs; She also reports increased back pain;    Limitations  Standing;Walking    How long can you stand comfortably?  30 min with fatigue;    How long can you walk comfortably?  less than 1/2 a mile    Patient Stated Goals  "Be able to walk a mile a day."    Currently in Pain?  Yes    Pain Score  3     Pain Location  Back    Pain Orientation  Lower    Pain Descriptors / Indicators  Aching;Sore    Pain Type  Acute pain    Pain Onset  1 to 4 weeks ago    Pain Frequency  Intermittent     Aggravating Factors   worse with sitting especially in soft chairs; could also be worse with footwear/inserts    Pain Relieving Factors  sitting in straight chair, standing; walking    Effect of Pain on Daily Activities  decreased seated tolerance;    Multiple Pain Sites  No         TREATMENT: PT assessed patient's posture in standing: she exhibits better ankle positioning with less foot pronation. However patient does exhibit slightly higher left hip compared to right hip indicating possible uneven hip height;  Patient hooklying with moist heat to low back: Instructed patient in LE exercise to facilitate better flexibility and mobility: Pball under BLE: -lumbar trunk rotation x1 min with cues to avoid painful ROM with min A for positioning; -Double knee to chest stretch 10 sec hold x5 reps; Patient reports slight tightness in right knee but denies any pain;   Patient supine: PT utilized rolling stick to right quadriceps muscle soft tissue massage x5 min to facilitate better tissue extensibility; PT identified multiple trigger points in muscle belly; She denies any significant knee pain but does report tightness; Patient verbalized that she has a rolling stick at home. Educated patient on how to utilize rolling stick for better tissue mobility and to reduce tightness;  PT performed passive single to chest stretch on LLE 20 sec hold x2 reps; PT performed passive LLE modified piriformis stretch 20 sec hold x2 reps; Patient denies any increase in pain; She was able to exhibit better tissue extensibility and improved ROM following stretches;  Upon standing, patient exhibits less uneven hip height but does exhibit possible lateral lumbar shift to right side.  Instructed patient in gait x150 feet with increased lateral shift becoming more apparent with increased walking;  PT instructed patient in standing left lateral lean with LUE facilitating better lean at Lumbopelvic area of discomfort  to facilitate better centralization of symptoms; Instructed patient to do exercise x10 reps, at least 1x an hour when awake to facilitate better mobility; Provided written HEP for better adherence; Patient verbalized understanding of exercise utilizing teachback method.  Patient would benefit from additional skilled intervention including continued assessment of custom orthotics to ensure proper fit and positioning to reduce LE and back discomfort;                       PT Education - 04/01/19 1103    Education Details  stretches/HEP    Person(s) Educated  Patient  Methods  Explanation;Verbal cues    Comprehension  Verbalized understanding;Returned demonstration;Verbal cues required;Need further instruction       PT Short Term Goals - 03/19/19 0737      PT SHORT TERM GOAL #1   Title  Patient will be adherent to HEP at least 3x a week to improve functional strength and balance for better safety at home.    Time  4    Period  Weeks    Status  New    Target Date  04/16/19      PT SHORT TERM GOAL #2   Title  Patient will increase RLE knee AROM extension to within 5 degrees of neutral for better standing and walking tolerance;     Time  4    Period  Weeks    Status  New    Target Date  04/16/19      PT SHORT TERM GOAL #3   Title  Patient will reports maximum of 1 sleep disturbances per night over last 3 days to exhibit improved sleeping tolerance with less knee pain and reduced fatigue.     Time  4    Period  Weeks    Status  New    Target Date  04/16/19        PT Long Term Goals - 03/19/19 0834      PT LONG TERM GOAL #1   Title  Patient will increase BLE gross strength to 4+/5 as to improve functional strength for independent gait, increased standing tolerance and increased ADL ability.    Time  8    Period  Weeks    Status  New    Target Date  05/14/19      PT LONG TERM GOAL #2   Title  Patient will increase lower extremity functional scale to >60/80  to demonstrate improved functional mobility and increased tolerance with ADLs.     Time  8    Period  Weeks    Status  New    Target Date  05/14/19      PT LONG TERM GOAL #3   Title  Patient will report a worst pain of 3/10 on VAS in      right knee       to improve tolerance with ADLs and reduced symptoms with activities.     Time  8    Period  Weeks    Status  New    Target Date  05/14/19      PT LONG TERM GOAL #4   Title  Patient will complete >1000 feet on 6 min walk test without increase in knee pain to improve community ambulator distance for functional tasks and return to PLOF.    Time  8    Period  Weeks    Status  New    Target Date  05/14/19            Plan - 04/01/19 1105    Clinical Impression Statement  Patient motivated and tolerated session well. She reports overall less discomfort in right knee, however does have intermittent tightness in right quads. Patient does report new onset left sided low back pain which she feels could possibly be related to shoe inserts. In standing, PT identified possible left lateral shift; unsure if this is related to inserts, posture or pain; PT instructed patient in advanced exercise as part of HEP to facilitate better lumbopelvic mobility; She verbalized and demonstrated understanding; Patient would benefit from additional skilled PT  intervention to improve strength, balance and gait safety;    Personal Factors and Comorbidities  Age;Comorbidity 2;Fitness;Time since onset of injury/illness/exacerbation    Comorbidities  diabetes, imbalance    Examination-Activity Limitations  Squat;Lift;Stairs;Bend;Locomotion Level;Stand;Transfers;Sleep    Examination-Participation Restrictions  Cleaning;Community Activity;Shop;Church;Yard Work;Volunteer    Stability/Clinical Decision Making  Evolving/Moderate complexity    Rehab Potential  Good    Clinical Impairments Affecting Rehab Potential  motivated, good PLOF and good caregiver support    PT  Frequency  2x / week    PT Duration  8 weeks    PT Treatment/Interventions  Aquatic Therapy;Cryotherapy;Electrical Stimulation;Moist Heat;Ultrasound;Gait training;Stair training;Functional mobility training;Therapeutic activities;Therapeutic exercise;Balance training;Neuromuscular re-education;Patient/family education;Orthotic Fit/Training;Manual techniques;Passive range of motion;Energy conservation;Taping    PT Home Exercise Plan  continue as previously given;    Consulted and Agree with Plan of Care  Patient       Patient will benefit from skilled therapeutic intervention in order to improve the following deficits and impairments:  Abnormal gait, Decreased activity tolerance, Pain, Difficulty walking, Decreased mobility, Decreased balance, Decreased range of motion, Impaired flexibility, Hypomobility, Decreased strength  Visit Diagnosis: 1. Acute pain of right knee   2. Muscle weakness (generalized)   3. Unsteadiness on feet   4. Difficulty in walking, not elsewhere classified        Problem List Patient Active Problem List   Diagnosis Date Noted  . Lumbar sprain 03/28/2019  . Dizziness   . Tinnitus, bilateral 11/28/2017  . Abnormal MRI of head 11/14/2017  . Meningioma ([place]) 09/21/2017  . Ataxia 05/11/2017  . PAF (paroxysmal atrial fibrillation) ([place]) 02/03/2017  . Preventative health care 06/02/2016  . Advance directive discussed with patient 06/02/2016  . Achilles tendonitis 03/11/2016  . Hypertension   . DVT, lower extremity, recurrent ([place])   . Type 2 diabetes, controlled, with peripheral neuropathy ([place])   . Generalized osteoarthritis of multiple sites   . GERD (gastroesophageal reflux disease)   . Hyperthyroidism   . Retinal hole of right eye 06/26/2014  . Amblyopia, right 05/08/2014  . Status post intraocular lens implant 05/08/2014    , PT, DPT 04/01/2019, 11:09 AM  [place] MAIN [facility] SERVICES [address] [place], [place], [address] Phone: [phone]   Fax:  [phone]  Name: Arlis Everly MRN: [id number] Date of Birth: 1941/04/26

## 2019-04-03 ENCOUNTER — Other Ambulatory Visit: Payer: Self-pay

## 2019-04-03 ENCOUNTER — Ambulatory Visit: Payer: Medicare Other | Admitting: Physical Therapy

## 2019-04-03 ENCOUNTER — Encounter: Payer: Self-pay | Admitting: Physical Therapy

## 2019-04-03 DIAGNOSIS — R2681 Unsteadiness on feet: Secondary | ICD-10-CM | POA: Diagnosis not present

## 2019-04-03 DIAGNOSIS — M6281 Muscle weakness (generalized): Secondary | ICD-10-CM

## 2019-04-03 DIAGNOSIS — M79671 Pain in right foot: Secondary | ICD-10-CM | POA: Diagnosis not present

## 2019-04-03 DIAGNOSIS — M25561 Pain in right knee: Secondary | ICD-10-CM | POA: Diagnosis not present

## 2019-04-03 DIAGNOSIS — R262 Difficulty in walking, not elsewhere classified: Secondary | ICD-10-CM | POA: Diagnosis not present

## 2019-04-03 DIAGNOSIS — M79672 Pain in left foot: Secondary | ICD-10-CM | POA: Diagnosis not present

## 2019-04-03 NOTE — Therapy (Signed)
[place] MAIN [place] [facility] SERVICES [address] [place], [place], [address] Phone: [phone]   Fax:  [phone]  Physical Therapy Treatment  Patient Details  Name: Angela Bentley MRN: [id number] Date of Birth: [birth year] Referring Provider (PT): Dr. [name] PCP   Encounter Date: 04/03/2019  PT End of Session - 04/03/19 0809    Visit Number  7    Number of Visits  22    Date for PT Re-Evaluation  05/14/19    Authorization Type  goals last re-assessed 03/18/19    PT Start Time  0801    PT Stop Time  0845    PT Time Calculation (min)  44 min    Activity Tolerance  Patient tolerated treatment well;No increased pain    Behavior During Therapy  WFL for tasks assessed/performed       Past Medical History:  Diagnosis Date  . Balance problems   . Brain tumor ([place])    a. Left intraventricular tumor ->stable by 06/2017 MRI. Followed @ [facility].  . Gait disturbance    a. Unsteady on feet/balance difficulty.  . Generalized osteoarthritis of multiple sites   . GERD (gastroesophageal reflux disease)   . History of DVT (deep vein thrombosis) 2016   estrogen and airflights. Rx with xarelto for 3 months  . History of stress test    a. 11/2016 ETT: No ST/T changes.  Performed to assess PAC/PVC burden with activity ->No ectopy noted.  . Hypertension   . Hyperthyroidism    treated briefly in college  . Insomnia   . Obesity   . PAF (paroxysmal atrial fibrillation) ([place])    a. 12/2016 Event Monitor: brief run of PAF-->CHA2DS2VASc = 5--> Xarelto.  . Plantar fasciitis   . Retinal tear of left eye   . Rosacea   . Type 2 diabetes, controlled, with peripheral neuropathy ([place])    a. 11/2016 A1c 7.4.  . Uterine fibroid     Past Surgical History:  Procedure Laterality Date  . CARPAL TUNNEL RELEASE Right    Dr Burney Gauze  . CATARACT EXTRACTION    . RETINAL TEAR REPAIR CRYOTHERAPY     10/09/13, then again 3/15    There were no vitals filed for this  visit.  Subjective Assessment - 04/03/19 0807    Subjective  Patient reports doing well; She reports minimal right knee pain; She is concerned that she is not doing the exercises correctly as she started having slight right low back pain;    Pertinent History  78 yo Female continues to have right knee pain; She went to orthopedic and had a repeat cortisone injection for right lateral meniscus tear. She reports injection was successful in that she is not having pain with weight bearing in right knee; She is still having right IT band pain; Pt reports that she should not need surgery at this time; She is still having clicking in both knees. She did get shoe inserts but states that they are not working well. She incrementally increased wear and states that they still bothered her. She reports that she hasn't been walking or exercising due to pain and fatigue; She reports not sleeping well and states that when she doesn't sleep well she is more unsteady and having difficulty with her balance; She is having left ankle cramping as a result of new inserts; She reports the pain is worse in thigh and knees after wearing inserts for longer period of time. She feels like it throws off her  balance and is exacerbating burning pain in legs; She also reports increased back pain;    Limitations  Standing;Walking    How long can you stand comfortably?  30 min with fatigue;    How long can you walk comfortably?  less than 1/2 a mile    Patient Stated Goals  "Be able to walk a mile a day."    Currently in Pain?  Yes    Pain Score  1     Pain Location  Back    Pain Orientation  Right;Lower    Pain Descriptors / Indicators  Aching;Sore    Pain Type  Acute pain    Pain Onset  Today    Pain Frequency  Intermittent    Aggravating Factors   worse with side bend exercise    Pain Relieving Factors  rest/heat    Effect of Pain on Daily Activities  no change;    Multiple Pain Sites  No           TREATMENT: Warm up  on Nustep BUE/BLE level 2 x4 min (unbilled); concurrent with moist heat to low back for comfort;   Leg press: BLE plate 95# 0W40 with min VCs for correct positioning and to slow down LE movement for better motor control; BLE heel raises 95# 2x12 with min Vcs for correct positioning including to keep knee straight to isolate calf strengthening;   Standing: Alternate march x1 min with 1 rail assist intermittently with cues for positioning to avoid lateral loss of balance; Mini squat x10 reps with cues to keep knees apart Standing with red tband around BLE: -hip abduction x15 reps bilaterally with cues to avoid lateral lean for better hip strengthening; -side stepping x10 feet x2 laps each direction; Patient required min-moderate verbal/tactile cues for correct exercise technique.   Response to treatment; Patient tolerated well; She was able to exhibit better ROM with less pain in right knee. Overall patient is walking better with less lateral shift and improved foot clearance; Advanced HEP, provided written HEP for better adherence;                   PT Education - 04/03/19 0809    Education Details  strengthening/HEP reinforced;    Person(s) Educated  Patient    Methods  Explanation;Verbal cues    Comprehension  Verbalized understanding;Returned demonstration;Verbal cues required;Need further instruction       PT Short Term Goals - 03/19/19 9735      PT SHORT TERM GOAL #1   Title  Patient will be adherent to HEP at least 3x a week to improve functional strength and balance for better safety at home.    Time  4    Period  Weeks    Status  New    Target Date  04/16/19      PT SHORT TERM GOAL #2   Title  Patient will increase RLE knee AROM extension to within 5 degrees of neutral for better standing and walking tolerance;     Time  4    Period  Weeks    Status  New    Target Date  04/16/19      PT SHORT TERM GOAL #3   Title  Patient will reports maximum of 1  sleep disturbances per night over last 3 days to exhibit improved sleeping tolerance with less knee pain and reduced fatigue.     Time  4    Period  Weeks    Status  New    Target Date  04/16/19        PT Long Term Goals - 03/19/19 0834      PT LONG TERM GOAL #1   Title  Patient will increase BLE gross strength to 4+/5 as to improve functional strength for independent gait, increased standing tolerance and increased ADL ability.    Time  8    Period  Weeks    Status  New    Target Date  05/14/19      PT LONG TERM GOAL #2   Title  Patient will increase lower extremity functional scale to >60/80 to demonstrate improved functional mobility and increased tolerance with ADLs.     Time  8    Period  Weeks    Status  New    Target Date  05/14/19      PT LONG TERM GOAL #3   Title  Patient will report a worst pain of 3/10 on VAS in      right knee       to improve tolerance with ADLs and reduced symptoms with activities.     Time  8    Period  Weeks    Status  New    Target Date  05/14/19      PT LONG TERM GOAL #4   Title  Patient will complete >1000 feet on 6 min walk test without increase in knee pain to improve community ambulator distance for functional tasks and return to PLOF.    Time  8    Period  Weeks    Status  New    Target Date  05/14/19            Plan - 04/03/19 0847    Clinical Impression Statement  Patient motivated and tolerated session well; Instructed patient in advanced LE strengthening to facilitate better motor control; she did exhibit slight weakness in right hip with poor knee control during squats; Patient denies any increase in knee pain with advanced exercise. She would benefit from additional skilled PT intervention to improve strength, balance and mobility;       Patient will benefit from skilled therapeutic intervention in order to improve the following deficits and impairments:     Visit Diagnosis: 1. Acute pain of right knee   2. Muscle  weakness (generalized)   3. Unsteadiness on feet   4. Difficulty in walking, not elsewhere classified        Problem List Patient Active Problem List   Diagnosis Date Noted  . Lumbar sprain 03/28/2019  . Dizziness   . Tinnitus, bilateral 11/28/2017  . Abnormal MRI of head 11/14/2017  . Meningioma ([place]) 09/21/2017  . Ataxia 05/11/2017  . PAF (paroxysmal atrial fibrillation) ([place]) 02/03/2017  . Preventative health care 06/02/2016  . Advance directive discussed with patient 06/02/2016  . Achilles tendonitis 03/11/2016  . Hypertension   . DVT, lower extremity, recurrent ([place])   . Type 2 diabetes, controlled, with peripheral neuropathy ([place])   . Generalized osteoarthritis of multiple sites   . GERD (gastroesophageal reflux disease)   . Hyperthyroidism   . Retinal hole of right eye 06/26/2014  . Amblyopia, right 05/08/2014  . Status post intraocular lens implant 05/08/2014    Chez Bulnes PT, DPT 04/03/2019, 8:48 AM  [place] MAIN [facility] SERVICES [address] [place], [place], [address] Phone: [phone]   Fax:  [phone]  Name: Glorie Dowlen MRN: [id number] Date of Birth: 1941/07/28

## 2019-04-03 NOTE — Patient Instructions (Signed)
Access Code: [email or web address]  URL: [email or web address]  Date: 04/03/2019  Prepared by: Blanche East   Exercises  Standing March with Counter Support - 2 sets - 1x daily - 7x weekly  Mini Squat with Counter Support - 10 reps - 2 sets - 1x daily - 7x weekly  Standing Hip Abduction with Resistance at Ankles and Counter Support - 15 reps - 2 sets - 1x daily - 7x weekly  Side Stepping with Resistance at Ankles and Counter Support - 5 reps - 2 sets - 1x daily - 7x weekly

## 2019-04-08 ENCOUNTER — Encounter: Payer: Self-pay | Admitting: Physical Therapy

## 2019-04-08 ENCOUNTER — Other Ambulatory Visit: Payer: Self-pay

## 2019-04-08 ENCOUNTER — Ambulatory Visit: Payer: Medicare Other | Admitting: Physical Therapy

## 2019-04-08 DIAGNOSIS — M79671 Pain in right foot: Secondary | ICD-10-CM | POA: Diagnosis not present

## 2019-04-08 DIAGNOSIS — R2681 Unsteadiness on feet: Secondary | ICD-10-CM

## 2019-04-08 DIAGNOSIS — M79672 Pain in left foot: Secondary | ICD-10-CM

## 2019-04-08 DIAGNOSIS — M25561 Pain in right knee: Secondary | ICD-10-CM | POA: Diagnosis not present

## 2019-04-08 DIAGNOSIS — M6281 Muscle weakness (generalized): Secondary | ICD-10-CM | POA: Diagnosis not present

## 2019-04-08 DIAGNOSIS — R262 Difficulty in walking, not elsewhere classified: Secondary | ICD-10-CM

## 2019-04-08 NOTE — Patient Instructions (Addendum)
Feet Apart, Varied Arm Positions - Eyes Closed    Stand in a corner, feet apart, supported against the wall to start with chair in front for balance.  Stand quietly with eyes open 10 sec hold, focusing on keeping erect posture and not leaning side/side Close eyes for 10 sec and visualize standing in neutral position not leaning;  Repeat 3-5 times, each day Progress to standing away from wall without leaning side to side Then progress to feet together and then one foot ahead of the other; Copyright  [employer]. All rights reserved.    Other exercise for home: -Alternate march x1 min  -mini squat x10 repetitions -hip out to side with red tband around legs x15 repetitions -side stepping with red tband around legs x5 laps

## 2019-04-08 NOTE — Therapy (Signed)
[place] MAIN [facility] SERVICES [address] [place], [place], [address] Phone: [phone]   Fax:  [phone]  Physical Therapy Treatment  Patient Details  Name: Angela Bentley MRN: [id number] Date of Birth: 03/28/1941 Referring Provider (PT): Dr. [name] PCP   Encounter Date: 04/08/2019  PT End of Session - 04/08/19 0853    Visit Number  8    Number of Visits  22    Date for PT Re-Evaluation  05/14/19    Authorization Type  goals last re-assessed 03/18/19    PT Start Time  0845    PT Stop Time  0930    PT Time Calculation (min)  45 min    Activity Tolerance  Patient tolerated treatment well;No increased pain    Behavior During Therapy  WFL for tasks assessed/performed       Past Medical History:  Diagnosis Date  . Balance problems   . Brain tumor ([place])    a. Left intraventricular tumor ->stable by 06/2017 MRI. Followed @ [facility].  . Gait disturbance    a. Unsteady on feet/balance difficulty.  . Generalized osteoarthritis of multiple sites   . GERD (gastroesophageal reflux disease)   . History of DVT (deep vein thrombosis) 2016   estrogen and airflights. Rx with xarelto for 3 months  . History of stress test    a. 11/2016 ETT: No ST/T changes.  Performed to assess PAC/PVC burden with activity ->No ectopy noted.  . Hypertension   . Hyperthyroidism    treated briefly in college  . Insomnia   . Obesity   . PAF (paroxysmal atrial fibrillation) ([place])    a. 12/2016 Event Monitor: brief run of PAF-->CHA2DS2VASc = 5--> Xarelto.  . Plantar fasciitis   . Retinal tear of left eye   . Rosacea   . Type 2 diabetes, controlled, with peripheral neuropathy ([place])    a. 11/2016 A1c 7.4.  . Uterine fibroid     Past Surgical History:  Procedure Laterality Date  . CARPAL TUNNEL RELEASE Right    Dr Burney Gauze  . CATARACT EXTRACTION    . RETINAL TEAR REPAIR CRYOTHERAPY     10/09/13, then again 3/15    There were no vitals filed for this  visit.  Subjective Assessment - 04/08/19 0850    Subjective  Patient reports doing okay; She reports having increased fullness in ears and feeling off balance; She reports having a good visit in the pool; Overall she feels like her knee is getting better but still has tightness in front of leg;    Pertinent History  78 yo Female continues to have right knee pain; She went to orthopedic and had a repeat cortisone injection for right lateral meniscus tear. She reports injection was successful in that she is not having pain with weight bearing in right knee; She is still having right IT band pain; Pt reports that she should not need surgery at this time; She is still having clicking in both knees. She did get shoe inserts but states that they are not working well. She incrementally increased wear and states that they still bothered her. She reports that she hasn't been walking or exercising due to pain and fatigue; She reports not sleeping well and states that when she doesn't sleep well she is more unsteady and having difficulty with her balance; She is having left ankle cramping as a result of new inserts; She reports the pain is worse in thigh and knees after wearing inserts for  longer period of time. She feels like it throws off her balance and is exacerbating burning pain in legs; She also reports increased back pain;    Limitations  Standing;Walking    How long can you stand comfortably?  30 min with fatigue;    How long can you walk comfortably?  less than 1/2 a mile    Patient Stated Goals  "Be able to walk a mile a day."    Currently in Pain?  Yes    Pain Score  1     Pain Location  Knee    Pain Orientation  Right    Pain Descriptors / Indicators  Aching;Sore;Tightness    Pain Type  Acute pain    Pain Onset  Today    Pain Frequency  Intermittent    Aggravating Factors   worse with transfers/steps    Pain Relieving Factors  rest/heat    Effect of Pain on Daily Activities  no change;     Multiple Pain Sites  No           TREATMENT: Warm up on Nustep BUE/BLE level 2 x4 min (unbilled);   Leg press: BLE plate 95# 3X10 with min VCs for correct positioning and to slow down LE movement for better motor control;   Patient instructed in advanced balance exercise  Standing in parallel bars:  Standing on airex foam: -alternate toe taps to 4 inch step with 1-0 rail assist x15 reps bilaterally; -Standing one foot on airex, one foot on 4 inch step, BUE ball pass side/side x5 reps each foot on step -modified tandem stance: head turns side/side, up/down x5 reps each foot in front; -modified tandem stance eyes open/closed 10 sec hold x1 rep each; patient exhibits immediate loss of balance to left side with eyes closed; Patient required min VCs for balance stability, including to increase trunk control for less loss of balance with smaller base of support  Instructed patient in sensory organization exercise standing against wall feet apart eyes open/closed 10 sec holde x3 sets progressing to standing away from wall x3 sets; Advanced HEP- see patient instructions;    Reinforced HEP with march and hip abduction strengthening;    Response to treatment; Patient tolerated well; She was able to exhibit better ROM with less pain in right knee. Patient does exhibit heavy left lateral lean when standing on compliant surface with eyes closed indicating decreased vestibular positional awareness. Instructed patient in sensory organization exercise to reorient patient to midline. Advanced HEP, provided written HEP for better adherence;                         PT Education - 04/08/19 0853    Education Details  strengthening/HEP reinforced;    Person(s) Educated  Patient    Methods  Explanation;Verbal cues    Comprehension  Verbalized understanding;Returned demonstration;Verbal cues required;Need further instruction       PT Short Term Goals - 03/19/19 6269       PT SHORT TERM GOAL #1   Title  Patient will be adherent to HEP at least 3x a week to improve functional strength and balance for better safety at home.    Time  4    Period  Weeks    Status  New    Target Date  04/16/19      PT SHORT TERM GOAL #2   Title  Patient will increase RLE knee AROM extension to within 5 degrees of neutral  for better standing and walking tolerance;     Time  4    Period  Weeks    Status  New    Target Date  04/16/19      PT SHORT TERM GOAL #3   Title  Patient will reports maximum of 1 sleep disturbances per night over last 3 days to exhibit improved sleeping tolerance with less knee pain and reduced fatigue.     Time  4    Period  Weeks    Status  New    Target Date  04/16/19        PT Long Term Goals - 03/19/19 0834      PT LONG TERM GOAL #1   Title  Patient will increase BLE gross strength to 4+/5 as to improve functional strength for independent gait, increased standing tolerance and increased ADL ability.    Time  8    Period  Weeks    Status  New    Target Date  05/14/19      PT LONG TERM GOAL #2   Title  Patient will increase lower extremity functional scale to >60/80 to demonstrate improved functional mobility and increased tolerance with ADLs.     Time  8    Period  Weeks    Status  New    Target Date  05/14/19      PT LONG TERM GOAL #3   Title  Patient will report a worst pain of 3/10 on VAS in      right knee       to improve tolerance with ADLs and reduced symptoms with activities.     Time  8    Period  Weeks    Status  New    Target Date  05/14/19      PT LONG TERM GOAL #4   Title  Patient will complete >1000 feet on 6 min walk test without increase in knee pain to improve community ambulator distance for functional tasks and return to PLOF.    Time  8    Period  Weeks    Status  New    Target Date  05/14/19            Plan - 04/08/19 1258    Clinical Impression Statement  patient motivated and tolerated session  well. She was instructed in advanced LE strengthening without increase in knee pain. Instructed patient in balance exercise to facilitate better weight shift to RLE. During balance exercise, PT identified heavy left lateral lean with eyes closed. Instructed patient in sensory organization exercises to facilitate better vestibular positional awareness for improved stance control. Patient would benefit from additional skilled PT intervention to improve strength, balance and gait safety;    Personal Factors and Comorbidities  Age;Comorbidity 2;Fitness;Time since onset of injury/illness/exacerbation    Comorbidities  diabetes, imbalance    Examination-Activity Limitations  Squat;Lift;Stairs;Bend;Locomotion Level;Stand;Transfers;Sleep    Examination-Participation Restrictions  Cleaning;Community Activity;Shop;Church;Yard Work;Volunteer    Stability/Clinical Decision Making  Evolving/Moderate complexity    Rehab Potential  Good    Clinical Impairments Affecting Rehab Potential  motivated, good PLOF and good caregiver support    PT Frequency  2x / week    PT Duration  8 weeks    PT Treatment/Interventions  Aquatic Therapy;Cryotherapy;Electrical Stimulation;Moist Heat;Ultrasound;Gait training;Stair training;Functional mobility training;Therapeutic activities;Therapeutic exercise;Balance training;Neuromuscular re-education;Patient/family education;Orthotic Fit/Training;Manual techniques;Passive range of motion;Energy conservation;Taping    PT Home Exercise Plan  continue as previously given;    Consulted and Agree with  Plan of Care  Patient       Patient will benefit from skilled therapeutic intervention in order to improve the following deficits and impairments:  Abnormal gait, Decreased activity tolerance, Pain, Difficulty walking, Decreased mobility, Decreased balance, Decreased range of motion, Impaired flexibility, Hypomobility, Decreased strength  Visit Diagnosis: 1. Acute pain of right knee   2.  Muscle weakness (generalized)   3. Unsteadiness on feet   4. Difficulty in walking, not elsewhere classified   5. Pain in right foot   6. Pain in left foot        Problem List Patient Active Problem List   Diagnosis Date Noted  . Lumbar sprain 03/28/2019  . Dizziness   . Tinnitus, bilateral 11/28/2017  . Abnormal MRI of head 11/14/2017  . Meningioma ([place]) 09/21/2017  . Ataxia 05/11/2017  . PAF (paroxysmal atrial fibrillation) ([place]) 02/03/2017  . Preventative health care 06/02/2016  . Advance directive discussed with patient 06/02/2016  . Achilles tendonitis 03/11/2016  . Hypertension   . DVT, lower extremity, recurrent ([place])   . Type 2 diabetes, controlled, with peripheral neuropathy ([place])   . Generalized osteoarthritis of multiple sites   . GERD (gastroesophageal reflux disease)   . Hyperthyroidism   . Retinal hole of right eye 06/26/2014  . Amblyopia, right 05/08/2014  . Status post intraocular lens implant 05/08/2014    Ailey Wessling PT, DPT 04/09/2019, 3:51 PM  [place] MAIN [facility] SERVICES [address] [place], [place], [address] Phone: [phone]   Fax:  [phone]  Name: Angela Bentley MRN: [id number] Date of Birth: 18-May-1941

## 2019-04-10 ENCOUNTER — Encounter: Payer: Self-pay | Admitting: Physical Therapy

## 2019-04-10 ENCOUNTER — Other Ambulatory Visit: Payer: Self-pay

## 2019-04-10 ENCOUNTER — Ambulatory Visit: Payer: Medicare Other | Admitting: Physical Therapy

## 2019-04-10 DIAGNOSIS — M6281 Muscle weakness (generalized): Secondary | ICD-10-CM

## 2019-04-10 DIAGNOSIS — R2681 Unsteadiness on feet: Secondary | ICD-10-CM

## 2019-04-10 DIAGNOSIS — R262 Difficulty in walking, not elsewhere classified: Secondary | ICD-10-CM | POA: Diagnosis not present

## 2019-04-10 DIAGNOSIS — M79672 Pain in left foot: Secondary | ICD-10-CM | POA: Diagnosis not present

## 2019-04-10 DIAGNOSIS — M25561 Pain in right knee: Secondary | ICD-10-CM | POA: Diagnosis not present

## 2019-04-10 DIAGNOSIS — M79671 Pain in right foot: Secondary | ICD-10-CM | POA: Diagnosis not present

## 2019-04-10 NOTE — Therapy (Signed)
[place] MAIN [facility] SERVICES [address] [place], [place], [address] Phone: [phone]   Fax:  [phone]  Physical Therapy Treatment  Patient Details  Name: Angela Bentley MRN: [id number] Date of Birth: [birth year] Referring Provider (PT): Dr. [name] PCP   Encounter Date: 04/10/2019  PT End of Session - 04/10/19 0815    Visit Number  9    Number of Visits  22    Date for PT Re-Evaluation  05/14/19    Authorization Type  goals last re-assessed 03/18/19    PT Start Time  0804    PT Stop Time  0846    PT Time Calculation (min)  42 min    Equipment Utilized During Treatment  Gait belt    Activity Tolerance  Patient tolerated treatment well;No increased pain    Behavior During Therapy  WFL for tasks assessed/performed       Past Medical History:  Diagnosis Date  . Balance problems   . Brain tumor ([place])    a. Left intraventricular tumor ->stable by 06/2017 MRI. Followed @ [facility].  . Gait disturbance    a. Unsteady on feet/balance difficulty.  . Generalized osteoarthritis of multiple sites   . GERD (gastroesophageal reflux disease)   . History of DVT (deep vein thrombosis) 2016   estrogen and airflights. Rx with xarelto for 3 months  . History of stress test    a. 11/2016 ETT: No ST/T changes.  Performed to assess PAC/PVC burden with activity ->No ectopy noted.  . Hypertension   . Hyperthyroidism    treated briefly in college  . Insomnia   . Obesity   . PAF (paroxysmal atrial fibrillation) ([place])    a. 12/2016 Event Monitor: brief run of PAF-->CHA2DS2VASc = 5--> Xarelto.  . Plantar fasciitis   . Retinal tear of left eye   . Rosacea   . Type 2 diabetes, controlled, with peripheral neuropathy ([place])    a. 11/2016 A1c 7.4.  . Uterine fibroid     Past Surgical History:  Procedure Laterality Date  . CARPAL TUNNEL RELEASE Right    Dr Burney Gauze  . CATARACT EXTRACTION    . RETINAL TEAR REPAIR CRYOTHERAPY     10/09/13, then again 3/15     There were no vitals filed for this visit.  Subjective Assessment - 04/10/19 0812    Subjective  Patient reports continued fatigue from not sleeping well at night; She also reports not wearing her supportive shoes as often because of hot weather and is having mild increase in right lateral hip pain;    Pertinent History  78 yo Female continues to have right knee pain; She went to orthopedic and had a repeat cortisone injection for right lateral meniscus tear. She reports injection was successful in that she is not having pain with weight bearing in right knee; She is still having right IT band pain; Pt reports that she should not need surgery at this time; She is still having clicking in both knees. She did get shoe inserts but states that they are not working well. She incrementally increased wear and states that they still bothered her. She reports that she hasn't been walking or exercising due to pain and fatigue; She reports not sleeping well and states that when she doesn't sleep well she is more unsteady and having difficulty with her balance; She is having left ankle cramping as a result of new inserts; She reports the pain is worse in thigh and knees after  wearing inserts for longer period of time. She feels like it throws off her balance and is exacerbating burning pain in legs; She also reports increased back pain;    Limitations  Standing;Walking    How long can you stand comfortably?  30 min with fatigue;    How long can you walk comfortably?  less than 1/2 a mile    Patient Stated Goals  "Be able to walk a mile a day."    Currently in Pain?  Yes    Pain Score  4     Pain Location  Back    Pain Orientation  Right;Lower    Pain Descriptors / Indicators  Aching;Sore    Pain Type  Acute pain    Pain Onset  Yesterday    Pain Frequency  Intermittent    Aggravating Factors   worse with prolonged standing;    Pain Relieving Factors  rest/heat    Effect of Pain on Daily Activities  no  change;    Multiple Pain Sites  No           TREATMENT: Warm up on Nustep BUE/BLE level 2 x4 min (unbilled) concurrent with moist heat to low back for comfort;    Leg press: BLE plate 105# [id number] with min VCs for correct positioning and to slow down LE movement for better motor control;  Patient sidelying: Red tband clamshells 2x15 with min VCs for correct position to avoid compensation;  PT utilized rolling stick for soft tissue massage to right IT band/gluteal muscle x5 min; Increased soreness noted at right greater trochanter which was relieved with rolling stick soft tissue massage;   Prone: Patient transitioned to prone with pillow under stomach for less discomfort in low back:  RLE quad stretch 20 sec hold x2 reps; RLE knee flexion hamstring curl AROM x10 reps with cues to avoid excessive ROM for less muscle cramp; Instructed patient in gluteal max hip extension AROM 2x10 with min Vcs for correct positioning to increase gluteal activation;   Patient transitioned to supine: PT instructed patient in LE stretches to reduce gluteal tightness and reduce discomfort: -RLE single knee to chest stretch 20 sec hold x2 reps; -RLE modified piriformis stretch 20 sec hold x2 reps; Patient required min VCs for correct positioning; instructed patient to perform stretches in the morning and when tightness felt;  Response to treatment: Patient tolerated well reporting less discomfort and less stiffness at end of session; Provided patient with advanced HEP with written instructions for better adherence. PT identified weakness in Hip abductors which could be contributing to some knee discomfort. Focused on LE strengthening and followed with stretches to reduce delayed onset muscle soreness;                                 PT Education - 04/10/19 0815    Education Details  strengthening/balance/HEP reinforced;    Person(s) Educated  Patient    Methods  Explanation;Verbal  cues    Comprehension  Verbalized understanding;Returned demonstration;Verbal cues required;Need further instruction       PT Short Term Goals - 03/19/19 2956      PT SHORT TERM GOAL #1   Title  Patient will be adherent to HEP at least 3x a week to improve functional strength and balance for better safety at home.    Time  4    Period  Weeks    Status  New  Target Date  04/16/19      PT SHORT TERM GOAL #2   Title  Patient will increase RLE knee AROM extension to within 5 degrees of neutral for better standing and walking tolerance;     Time  4    Period  Weeks    Status  New    Target Date  04/16/19      PT SHORT TERM GOAL #3   Title  Patient will reports maximum of 1 sleep disturbances per night over last 3 days to exhibit improved sleeping tolerance with less knee pain and reduced fatigue.     Time  4    Period  Weeks    Status  New    Target Date  04/16/19        PT Long Term Goals - 03/19/19 0834      PT LONG TERM GOAL #1   Title  Patient will increase BLE gross strength to 4+/5 as to improve functional strength for independent gait, increased standing tolerance and increased ADL ability.    Time  8    Period  Weeks    Status  New    Target Date  05/14/19      PT LONG TERM GOAL #2   Title  Patient will increase lower extremity functional scale to >60/80 to demonstrate improved functional mobility and increased tolerance with ADLs.     Time  8    Period  Weeks    Status  New    Target Date  05/14/19      PT LONG TERM GOAL #3   Title  Patient will report a worst pain of 3/10 on VAS in      right knee       to improve tolerance with ADLs and reduced symptoms with activities.     Time  8    Period  Weeks    Status  New    Target Date  05/14/19      PT LONG TERM GOAL #4   Title  Patient will complete >1000 feet on 6 min walk test without increase in knee pain to improve community ambulator distance for functional tasks and return to PLOF.    Time  8    Period   Weeks    Status  New    Target Date  05/14/19            Plan - 04/10/19 1014    Clinical Impression Statement  Patient motivated and participated well within session. She exhibits increased RLE quad discomfort with hip abduction strengthening. PT advanced HEP with hip strengthening focusing on gluteal activation. Patient responded well with cues with improved positioning and exercise technique; She tolerated stretches well reporting less soreness at end of session. Patient would benefit from additional skilled PT intervention to improve strength and mobility while reducing pain; Plan to assess goals next visit;    Personal Factors and Comorbidities  Age;Comorbidity 2;Fitness;Time since onset of injury/illness/exacerbation    Comorbidities  diabetes, imbalance    Examination-Activity Limitations  Squat;Lift;Stairs;Bend;Locomotion Level;Stand;Transfers;Sleep    Examination-Participation Restrictions  Cleaning;Community Activity;Shop;Church;Yard Work;Volunteer    Stability/Clinical Decision Making  Evolving/Moderate complexity    Rehab Potential  Good    Clinical Impairments Affecting Rehab Potential  motivated, good PLOF and good caregiver support    PT Frequency  2x / week    PT Duration  8 weeks    PT Treatment/Interventions  Aquatic Therapy;Cryotherapy;Electrical Stimulation;Moist Heat;Ultrasound;Gait training;Stair training;Functional mobility training;Therapeutic activities;Therapeutic  exercise;Balance training;Neuromuscular re-education;Patient/family education;Orthotic Fit/Training;Manual techniques;Passive range of motion;Energy conservation;Taping    PT Home Exercise Plan  continue as previously given;    Consulted and Agree with Plan of Care  Patient       Patient will benefit from skilled therapeutic intervention in order to improve the following deficits and impairments:  Abnormal gait, Decreased activity tolerance, Pain, Difficulty walking, Decreased mobility, Decreased  balance, Decreased range of motion, Impaired flexibility, Hypomobility, Decreased strength  Visit Diagnosis: 1. Acute pain of right knee   2. Muscle weakness (generalized)   3. Unsteadiness on feet   4. Difficulty in walking, not elsewhere classified        Problem List Patient Active Problem List   Diagnosis Date Noted  . Lumbar sprain 03/28/2019  . Dizziness   . Tinnitus, bilateral 11/28/2017  . Abnormal MRI of head 11/14/2017  . Meningioma ([place]) 09/21/2017  . Ataxia 05/11/2017  . PAF (paroxysmal atrial fibrillation) ([place]) 02/03/2017  . Preventative health care 06/02/2016  . Advance directive discussed with patient 06/02/2016  . Achilles tendonitis 03/11/2016  . Hypertension   . DVT, lower extremity, recurrent ([place])   . Type 2 diabetes, controlled, with peripheral neuropathy ([place])   . Generalized osteoarthritis of multiple sites   . GERD (gastroesophageal reflux disease)   . Hyperthyroidism   . Retinal hole of right eye 06/26/2014  . Amblyopia, right 05/08/2014  . Status post intraocular lens implant 05/08/2014    Margarett Viti PT, DPT 04/10/2019, 10:15 AM  [place] MAIN [facility] SERVICES [place], [place], [address] Phone: [phone]   Fax:  [phone]  Name: Angela Bentley MRN: [id number] Date of Birth: 11-Nov-1940

## 2019-04-10 NOTE — Patient Instructions (Signed)
Access Code: [email or web address]  URL: [email or web address]  Date: 04/10/2019  Prepared by: Blanche East   Exercises  Hooklying Single Knee to Chest Stretch - 3 reps - 2 sets - 20 hold - 1x daily - 7x weekly  Supine Piriformis Stretch with Foot on Ground - 3 reps - 2 sets - 20 hold - 1x daily - 7x weekly  Clamshell with Resistance - 15 reps - 2 sets - 1x daily - 7x weekly  Prone Hip Extension with Bent Knee - 15 reps - 2 sets - 1x daily - 7x weekly  Prone Knee Flexion - 10 reps - 2 sets - 1x daily - 7x weekly

## 2019-04-15 ENCOUNTER — Ambulatory Visit: Payer: Medicare Other | Admitting: Physical Therapy

## 2019-04-16 DIAGNOSIS — D320 Benign neoplasm of cerebral meninges: Secondary | ICD-10-CM | POA: Diagnosis not present

## 2019-04-17 ENCOUNTER — Ambulatory Visit: Payer: Medicare Other | Admitting: Physical Therapy

## 2019-04-17 ENCOUNTER — Encounter: Payer: Self-pay | Admitting: Physical Therapy

## 2019-04-17 ENCOUNTER — Other Ambulatory Visit: Payer: Self-pay

## 2019-04-17 DIAGNOSIS — M6281 Muscle weakness (generalized): Secondary | ICD-10-CM | POA: Diagnosis not present

## 2019-04-17 DIAGNOSIS — R262 Difficulty in walking, not elsewhere classified: Secondary | ICD-10-CM | POA: Diagnosis not present

## 2019-04-17 DIAGNOSIS — R2681 Unsteadiness on feet: Secondary | ICD-10-CM | POA: Diagnosis not present

## 2019-04-17 DIAGNOSIS — M79671 Pain in right foot: Secondary | ICD-10-CM | POA: Diagnosis not present

## 2019-04-17 DIAGNOSIS — M25561 Pain in right knee: Secondary | ICD-10-CM

## 2019-04-17 DIAGNOSIS — M79672 Pain in left foot: Secondary | ICD-10-CM | POA: Diagnosis not present

## 2019-04-17 NOTE — Therapy (Signed)
[place] MAIN [facility] SERVICES [address] [place], [place], [address] Phone: [phone]   Fax:  [phone]  Physical Therapy Treatment Physical Therapy Progress Note   Dates of reporting period  03/18/19  to   04/17/19   Patient Details  Name: Angela Bentley MRN: [id number] Date of Birth: [birth year] Referring Provider (PT): Dr. [name] PCP   Encounter Date: 04/17/2019  PT End of Session - 04/17/19 0902    Visit Number  10    Number of Visits  22    Date for PT Re-Evaluation  05/14/19    Authorization Type  goals last re-assessed 03/18/19    PT Start Time  0823    PT Stop Time  0845    PT Time Calculation (min)  22 min    Equipment Utilized During Treatment  Gait belt    Activity Tolerance  Patient tolerated treatment well;No increased pain    Behavior During Therapy  WFL for tasks assessed/performed       Past Medical History:  Diagnosis Date  . Balance problems   . Brain tumor ([place])    a. Left intraventricular tumor ->stable by 06/2017 MRI. Followed @ [facility].  . Gait disturbance    a. Unsteady on feet/balance difficulty.  . Generalized osteoarthritis of multiple sites   . GERD (gastroesophageal reflux disease)   . History of DVT (deep vein thrombosis) 2016   estrogen and airflights. Rx with xarelto for 3 months  . History of stress test    a. 11/2016 ETT: No ST/T changes.  Performed to assess PAC/PVC burden with activity ->No ectopy noted.  . Hypertension   . Hyperthyroidism    treated briefly in college  . Insomnia   . Obesity   . PAF (paroxysmal atrial fibrillation) ([place])    a. 12/2016 Event Monitor: brief run of PAF-->CHA2DS2VASc = 5--> Xarelto.  . Plantar fasciitis   . Retinal tear of left eye   . Rosacea   . Type 2 diabetes, controlled, with peripheral neuropathy ([place])    a. 11/2016 A1c 7.4.  . Uterine fibroid     Past Surgical History:  Procedure Laterality Date  . CARPAL TUNNEL RELEASE Right    Dr Burney Gauze  .  CATARACT EXTRACTION    . RETINAL TEAR REPAIR CRYOTHERAPY     10/09/13, then again 3/15    There were no vitals filed for this visit.  Subjective Assessment - 04/17/19 0827    Subjective  Patient reports tripping over the weekend and has not been able to do most of her HEP; She reports increased right lateral back pain; She did go and get an MRI but is unsure of the result. She reports being busy with trying to get her home ready for a deep clean; She reports continued right knee pain;    Pertinent History  78 yo Female continues to have right knee pain; She went to orthopedic and had a repeat cortisone injection for right lateral meniscus tear. She reports injection was successful in that she is not having pain with weight bearing in right knee; She is still having right IT band pain; Pt reports that she should not need surgery at this time; She is still having clicking in both knees. She did get shoe inserts but states that they are not working well. She incrementally increased wear and states that they still bothered her. She reports that she hasn't been walking or exercising due to pain and fatigue; She reports not sleeping  well and states that when she doesn't sleep well she is more unsteady and having difficulty with her balance; She is having left ankle cramping as a result of new inserts; She reports the pain is worse in thigh and knees after wearing inserts for longer period of time. She feels like it throws off her balance and is exacerbating burning pain in legs; She also reports increased back pain;    Limitations  Standing;Walking    How long can you stand comfortably?  30 min with fatigue;    How long can you walk comfortably?  less than 1/2 a mile    Patient Stated Goals  "Be able to walk a mile a day."    Currently in Pain?  Yes    Pain Score  1     Pain Location  Knee    Pain Orientation  Right    Pain Descriptors / Indicators  Aching;Sore    Pain Type  Chronic pain    Pain Onset   More than a month ago    Pain Frequency  Intermittent    Aggravating Factors   worse with prolonged standing    Pain Relieving Factors  rest/heat    Effect of Pain on Daily Activities  no change;    Multiple Pain Sites  No         TREATMENT: Warm up on Nustep BUE/BLE level 2 x3 min (unbilled)  Leg press: BLE plate 105# [id number] with min VCs for correct positioning and to slow down LE movement for better motor control;  Patient instructed in knee ROM, gait ability and overall mobility using LEFs questionnaire. See below;  Response to treatment: Patient tolerated well reporting less discomfort and less stiffness at end of session; Overall she is progressing with improved knee ROM and less knee pain. She does continues to have soreness in right thigh which is likely related to fatigue from quad weakness. Patient's condition has the potential to improve in response to therapy. Maximum improvement is yet to be obtained. The anticipated improvement is attainable and reasonable in a generally predictable time.  Patient reports recent fall which has limited her HEP adherence in the last week but reports overall doing some better. She is concerned about her balance and would like to work on balance in the future.     [facility] PT Assessment - 04/17/19 0001      Observation/Other Assessments   Lower Extremity Functional Scale   43/80 (the lower the score the greater the disability)      AROM   Overall AROM Comments  BUE and BLE are WFL    Right Knee Extension  0    Left Knee Extension  0      Ambulation/Gait   Gait Comments  pt ambulates with reciprocal gait pattern, does exhibit increased left foot drag; denies any increase in right knee pain with prolonged walking;       6 minute walk test results    Aerobic Endurance Distance Walked  800    Endurance additional comments  with increased shortness of breath, does exhibit increased left foot drag; denies any increase in right knee  pain                             PT Short Term Goals - 04/17/19 0834      PT SHORT TERM GOAL #1   Title  Patient will be adherent to HEP at least  3x a week to improve functional strength and balance for better safety at home.    Time  4    Period  Weeks    Status  On-going    Target Date  04/16/19      PT SHORT TERM GOAL #2   Title  Patient will increase RLE knee AROM extension to within 5 degrees of neutral for better standing and walking tolerance;     Time  4    Period  Weeks    Status  Achieved    Target Date  04/16/19      PT SHORT TERM GOAL #3   Title  Patient will reports maximum of 1 sleep disturbances per night over last 3 days to exhibit improved sleeping tolerance with less knee pain and reduced fatigue.     Baseline  7/22: usually wakes up to go to the bathroom and then unable to go back to sleep and get comfortable;    Time  4    Period  Weeks    Status  Not Met    Target Date  04/16/19        PT Long Term Goals - 04/17/19 0835      PT LONG TERM GOAL #1   Title  Patient will increase BLE gross strength to 4+/5 as to improve functional strength for independent gait, increased standing tolerance and increased ADL ability.    Time  8    Period  Weeks    Status  Partially Met    Target Date  05/14/19      PT LONG TERM GOAL #2   Title  Patient will increase lower extremity functional scale to >60/80 to demonstrate improved functional mobility and increased tolerance with ADLs.     Time  8    Period  Weeks    Status  Partially Met    Target Date  05/14/19      PT LONG TERM GOAL #3   Title  Patient will report a worst pain of 3/10 on VAS in      right knee       to improve tolerance with ADLs and reduced symptoms with activities.     Time  8    Period  Weeks    Status  Partially Met    Target Date  05/14/19      PT LONG TERM GOAL #4   Title  Patient will complete >1000 feet on 6 min walk test without increase in knee pain to improve  community ambulator distance for functional tasks and return to PLOF.    Time  8    Period  Weeks    Status  Partially Met    Target Date  05/14/19            Plan - 04/17/19 0092    Clinical Impression Statement  Patient motivated and participated well within session. She was late to session and had to leave on time due to other committments. Patient had a fall over the weekend with increased right lower back pain. She reports continued soreness in right thigh just above the knee which is likely muscle soreness. Patient does exhibit improved ROM in knee but continues to have weakness particularly in right hip and thigh. She would benefit from skilled PT intervention to improve strength and mobility and plan to progress balance exercise as pain subsides to improve gait safety and walking tolerance.    Personal Factors and Comorbidities  Age;Comorbidity 2;Fitness;Time since  onset of injury/illness/exacerbation    Comorbidities  diabetes, imbalance    Examination-Activity Limitations  Squat;Lift;Stairs;Bend;Locomotion Level;Stand;Transfers;Sleep    Examination-Participation Restrictions  Cleaning;Community Activity;Shop;Church;Yard Work;Volunteer    Stability/Clinical Decision Making  Evolving/Moderate complexity    Rehab Potential  Good    Clinical Impairments Affecting Rehab Potential  motivated, good PLOF and good caregiver support    PT Frequency  2x / week    PT Duration  8 weeks    PT Treatment/Interventions  Aquatic Therapy;Cryotherapy;Electrical Stimulation;Moist Heat;Ultrasound;Gait training;Stair training;Functional mobility training;Therapeutic activities;Therapeutic exercise;Balance training;Neuromuscular re-education;Patient/family education;Orthotic Fit/Training;Manual techniques;Passive range of motion;Energy conservation;Taping    PT Home Exercise Plan  continue as previously given;    Consulted and Agree with Plan of Care  Patient       Patient will benefit from skilled  therapeutic intervention in order to improve the following deficits and impairments:  Abnormal gait, Decreased activity tolerance, Pain, Difficulty walking, Decreased mobility, Decreased balance, Decreased range of motion, Impaired flexibility, Hypomobility, Decreased strength  Visit Diagnosis: 1. Acute pain of right knee   2. Muscle weakness (generalized)   3. Unsteadiness on feet   4. Difficulty in walking, not elsewhere classified        Problem List Patient Active Problem List   Diagnosis Date Noted  . Lumbar sprain 03/28/2019  . Dizziness   . Tinnitus, bilateral 11/28/2017  . Abnormal MRI of head 11/14/2017  . Meningioma ([place]) 09/21/2017  . Ataxia 05/11/2017  . PAF (paroxysmal atrial fibrillation) ([place]) 02/03/2017  . Preventative health care 06/02/2016  . Advance directive discussed with patient 06/02/2016  . Achilles tendonitis 03/11/2016  . Hypertension   . DVT, lower extremity, recurrent ([place])   . Type 2 diabetes, controlled, with peripheral neuropathy ([place])   . Generalized osteoarthritis of multiple sites   . GERD (gastroesophageal reflux disease)   . Hyperthyroidism   . Retinal hole of right eye 06/26/2014  . Amblyopia, right 05/08/2014  . Status post intraocular lens implant 05/08/2014    Trotter,Margaret PT, DPT 04/17/2019, 9:05 AM  [place] MAIN [facility] SERVICES [place], [place], [address] Phone: [phone]   Fax:  [phone]  Name: Angela Bentley MRN: [id number] Date of Birth: 1941/08/03

## 2019-04-22 ENCOUNTER — Encounter: Payer: Self-pay | Admitting: Physical Therapy

## 2019-04-22 ENCOUNTER — Other Ambulatory Visit: Payer: Self-pay

## 2019-04-22 ENCOUNTER — Ambulatory Visit: Payer: Medicare Other | Admitting: Physical Therapy

## 2019-04-22 DIAGNOSIS — R262 Difficulty in walking, not elsewhere classified: Secondary | ICD-10-CM

## 2019-04-22 DIAGNOSIS — M6281 Muscle weakness (generalized): Secondary | ICD-10-CM | POA: Diagnosis not present

## 2019-04-22 DIAGNOSIS — M79672 Pain in left foot: Secondary | ICD-10-CM | POA: Diagnosis not present

## 2019-04-22 DIAGNOSIS — R2681 Unsteadiness on feet: Secondary | ICD-10-CM

## 2019-04-22 DIAGNOSIS — M79671 Pain in right foot: Secondary | ICD-10-CM | POA: Diagnosis not present

## 2019-04-22 DIAGNOSIS — M25561 Pain in right knee: Secondary | ICD-10-CM | POA: Diagnosis not present

## 2019-04-22 NOTE — Therapy (Signed)
[place] MAIN [facility] SERVICES [address] [place], [place], [address] Phone: [phone]   Fax:  [phone]  Physical Therapy Treatment  Patient Details  Name: Angela Bentley MRN: [id number] Date of Birth: 05/17/1941 Referring Provider (PT): Dr. [name] PCP   Encounter Date: 04/22/2019  PT End of Session - 04/22/19 0946    Visit Number  11    Number of Visits  22    Date for PT Re-Evaluation  05/14/19    Authorization Type  goals last re-assessed 03/18/19    PT Start Time  0935    PT Stop Time  1020    PT Time Calculation (min)  45 min    Equipment Utilized During Treatment  Gait belt    Activity Tolerance  Patient tolerated treatment well;No increased pain    Behavior During Therapy  WFL for tasks assessed/performed       Past Medical History:  Diagnosis Date  . Balance problems   . Brain tumor ([place])    a. Left intraventricular tumor ->stable by 06/2017 MRI. Followed @ [facility].  . Gait disturbance    a. Unsteady on feet/balance difficulty.  . Generalized osteoarthritis of multiple sites   . GERD (gastroesophageal reflux disease)   . History of DVT (deep vein thrombosis) 2016   estrogen and airflights. Rx with xarelto for 3 months  . History of stress test    a. 11/2016 ETT: No ST/T changes.  Performed to assess PAC/PVC burden with activity ->No ectopy noted.  . Hypertension   . Hyperthyroidism    treated briefly in college  . Insomnia   . Obesity   . PAF (paroxysmal atrial fibrillation) ([place])    a. 12/2016 Event Monitor: brief run of PAF-->CHA2DS2VASc = 5--> Xarelto.  . Plantar fasciitis   . Retinal tear of left eye   . Rosacea   . Type 2 diabetes, controlled, with peripheral neuropathy ([place])    a. 11/2016 A1c 7.4.  . Uterine fibroid     Past Surgical History:  Procedure Laterality Date  . CARPAL TUNNEL RELEASE Right    Dr Burney Gauze  . CATARACT EXTRACTION    . RETINAL TEAR REPAIR CRYOTHERAPY     10/09/13, then again 3/15     There were no vitals filed for this visit.  Subjective Assessment - 04/22/19 0944    Subjective  Patient reports some soreness in right knee which increased over the weekend with increased hip exercise;    Pertinent History  78 yo Female continues to have right knee pain; She went to orthopedic and had a repeat cortisone injection for right lateral meniscus tear. She reports injection was successful in that she is not having pain with weight bearing in right knee; She is still having right IT band pain; Pt reports that she should not need surgery at this time; She is still having clicking in both knees. She did get shoe inserts but states that they are not working well. She incrementally increased wear and states that they still bothered her. She reports that she hasn't been walking or exercising due to pain and fatigue; She reports not sleeping well and states that when she doesn't sleep well she is more unsteady and having difficulty with her balance; She is having left ankle cramping as a result of new inserts; She reports the pain is worse in thigh and knees after wearing inserts for longer period of time. She feels like it throws off her balance and is exacerbating  burning pain in legs; She also reports increased back pain;    Limitations  Standing;Walking    How long can you stand comfortably?  30 min with fatigue;    How long can you walk comfortably?  less than 1/2 a mile    Patient Stated Goals  "Be able to walk a mile a day."    Currently in Pain?  Yes    Pain Score  2     Pain Location  Knee    Pain Orientation  Right    Pain Descriptors / Indicators  Aching;Sore    Pain Type  Chronic pain    Pain Onset  More than a month ago    Pain Frequency  Intermittent    Aggravating Factors   worse with prolonged standing/activity; exercise;    Pain Relieving Factors  rest/heat    Effect of Pain on Daily Activities  no change;    Multiple Pain Sites  No           TREATMENT: Warm up  on Nustep BUE/BLE level 2 x4 min (unbilled);  Patient prone with pillow under stomach: -hamstring curl AROM x10 reps bilaterally; PT applied 1/2 bolster under right knee to facilitate increased quad stretch; -Passive quad stretch 20 sec hold x2 reps bilaterally; -Gluteal max hip extension (knee flexed) x10 reps x2 sets bilaterally;  Leg press: BLE plate 115# [id number] with min VCs for correct positioning and to slow down LE movement for better motor control;  Patient sidelying: PT utilized rolling stick for soft tissue massage to right IT band/gluteal muscle x5 min; Increased soreness noted at right greater trochanter which was relieved with rolling stick soft tissue massage;   Patient transitioned to supine: PT instructed patient in LE stretches to reduce gluteal tightness and reduce discomfort: -RLE single knee to chest stretch 20 sec hold x2 reps; -RLE modified piriformis stretch 20 sec hold x2 reps; Patient required min VCs for correct positioning; instructed patient to perform stretches in the morning and when tightness felt;  RLE single leg bridge with LLE crossed over right leg in figure 4 stretch x10 reps with arms by side with cues for core stabilization to avoid trunk rotation;   Response to treatment: Patient tolerated well reporting less discomfort and less stiffness at end of session; Reinforced HEP with instruction to increase stretches after doing exercise for less soreness. Patient verbalized understanding. Also instructed patient to advance balance exercise with tandem stance with eyes closed for better static standing challenge; Patient denies any pain at end of session;                         PT Education - 04/22/19 0946    Education Details  strengthening/balance/HEP reinforced;    Person(s) Educated  Patient    Methods  Explanation;Verbal cues    Comprehension  Verbalized understanding;Returned demonstration;Verbal cues required;Need further  instruction       PT Short Term Goals - 04/17/19 0834      PT SHORT TERM GOAL #1   Title  Patient will be adherent to HEP at least 3x a week to improve functional strength and balance for better safety at home.    Time  4    Period  Weeks    Status  On-going    Target Date  04/16/19      PT SHORT TERM GOAL #2   Title  Patient will increase RLE knee AROM extension to within 5 degrees  of neutral for better standing and walking tolerance;     Time  4    Period  Weeks    Status  Achieved    Target Date  04/16/19      PT SHORT TERM GOAL #3   Title  Patient will reports maximum of 1 sleep disturbances per night over last 3 days to exhibit improved sleeping tolerance with less knee pain and reduced fatigue.     Baseline  7/22: usually wakes up to go to the bathroom and then unable to go back to sleep and get comfortable;    Time  4    Period  Weeks    Status  Not Met    Target Date  04/16/19        PT Long Term Goals - 04/17/19 0835      PT LONG TERM GOAL #1   Title  Patient will increase BLE gross strength to 4+/5 as to improve functional strength for independent gait, increased standing tolerance and increased ADL ability.    Time  8    Period  Weeks    Status  Partially Met    Target Date  05/14/19      PT LONG TERM GOAL #2   Title  Patient will increase lower extremity functional scale to >60/80 to demonstrate improved functional mobility and increased tolerance with ADLs.     Time  8    Period  Weeks    Status  Partially Met    Target Date  05/14/19      PT LONG TERM GOAL #3   Title  Patient will report a worst pain of 3/10 on VAS in      right knee       to improve tolerance with ADLs and reduced symptoms with activities.     Time  8    Period  Weeks    Status  Partially Met    Target Date  05/14/19      PT LONG TERM GOAL #4   Title  Patient will complete >1000 feet on 6 min walk test without increase in knee pain to improve community ambulator distance for  functional tasks and return to PLOF.    Time  8    Period  Weeks    Status  Partially Met    Target Date  05/14/19            Plan - 04/22/19 1137    Clinical Impression Statement  Patient motivated and participated well within session. Reinforced importance of LE strengthening; educated patient to increase LE stretches after exercise to reduce muscle soreness for better exercise tolerance. Increased tightness noted in bilaterally quadriceps muscle. Patient reports less soreness at end of session. She would benefit from additional skilled PT intervention to improve strength, balance and gait safety; Educated patient on ways to advance HEP for better strength and balance challenge;    Personal Factors and Comorbidities  Age;Comorbidity 2;Fitness;Time since onset of injury/illness/exacerbation    Comorbidities  diabetes, imbalance    Examination-Activity Limitations  Squat;Lift;Stairs;Bend;Locomotion Level;Stand;Transfers;Sleep    Examination-Participation Restrictions  Cleaning;Community Activity;Shop;Church;Yard Work;Volunteer    Stability/Clinical Decision Making  Evolving/Moderate complexity    Rehab Potential  Good    Clinical Impairments Affecting Rehab Potential  motivated, good PLOF and good caregiver support    PT Frequency  2x / week    PT Duration  8 weeks    PT Treatment/Interventions  Aquatic Therapy;Cryotherapy;Electrical Stimulation;Moist Heat;Ultrasound;Gait training;Stair training;Functional mobility training;Therapeutic  activities;Therapeutic exercise;Balance training;Neuromuscular re-education;Patient/family education;Orthotic Fit/Training;Manual techniques;Passive range of motion;Energy conservation;Taping    PT Home Exercise Plan  continue as previously given;    Consulted and Agree with Plan of Care  Patient       Patient will benefit from skilled therapeutic intervention in order to improve the following deficits and impairments:  Abnormal gait, Decreased activity  tolerance, Pain, Difficulty walking, Decreased mobility, Decreased balance, Decreased range of motion, Impaired flexibility, Hypomobility, Decreased strength  Visit Diagnosis: 1. Acute pain of right knee   2. Muscle weakness (generalized)   3. Unsteadiness on feet   4. Difficulty in walking, not elsewhere classified        Problem List Patient Active Problem List   Diagnosis Date Noted  . Lumbar sprain 03/28/2019  . Dizziness   . Tinnitus, bilateral 11/28/2017  . Abnormal MRI of head 11/14/2017  . Meningioma ([place]) 09/21/2017  . Ataxia 05/11/2017  . PAF (paroxysmal atrial fibrillation) ([place]) 02/03/2017  . Preventative health care 06/02/2016  . Advance directive discussed with patient 06/02/2016  . Achilles tendonitis 03/11/2016  . Hypertension   . DVT, lower extremity, recurrent ([place])   . Type 2 diabetes, controlled, with peripheral neuropathy ([place])   . Generalized osteoarthritis of multiple sites   . GERD (gastroesophageal reflux disease)   . Hyperthyroidism   . Retinal hole of right eye 06/26/2014  . Amblyopia, right 05/08/2014  . Status post intraocular lens implant 05/08/2014    Jari Dipasquale PT, DPT 04/22/2019, 11:39 AM  [place] MAIN [facility] SERVICES [address] [place], [place], [address] Phone: [phone]   Fax:  [phone]  Name: Angela Bentley MRN: [id number] Date of Birth: Mar 09, 1941

## 2019-04-23 DIAGNOSIS — D329 Benign neoplasm of meninges, unspecified: Secondary | ICD-10-CM | POA: Diagnosis not present

## 2019-04-24 ENCOUNTER — Other Ambulatory Visit: Payer: Self-pay

## 2019-04-24 ENCOUNTER — Encounter: Payer: Self-pay | Admitting: Physical Therapy

## 2019-04-24 ENCOUNTER — Ambulatory Visit: Payer: Medicare Other | Admitting: Physical Therapy

## 2019-04-24 DIAGNOSIS — M25561 Pain in right knee: Secondary | ICD-10-CM | POA: Diagnosis not present

## 2019-04-24 DIAGNOSIS — R2681 Unsteadiness on feet: Secondary | ICD-10-CM

## 2019-04-24 DIAGNOSIS — R262 Difficulty in walking, not elsewhere classified: Secondary | ICD-10-CM

## 2019-04-24 DIAGNOSIS — M79671 Pain in right foot: Secondary | ICD-10-CM | POA: Diagnosis not present

## 2019-04-24 DIAGNOSIS — M6281 Muscle weakness (generalized): Secondary | ICD-10-CM

## 2019-04-24 DIAGNOSIS — M79672 Pain in left foot: Secondary | ICD-10-CM | POA: Diagnosis not present

## 2019-04-24 NOTE — Therapy (Signed)
[place] MAIN [facility] SERVICES [address] [place], [place], [address] Phone: [phone]   Fax:  [phone]  Physical Therapy Treatment  Patient Details  Name: Angela Bentley MRN: [id number] Date of Birth: Feb 02, 1941 Referring Provider (PT): Dr. [name] PCP   Encounter Date: 04/24/2019  PT End of Session - 04/24/19 0902    Visit Number  12    Number of Visits  22    Date for PT Re-Evaluation  05/14/19    Authorization Type  goals last re-assessed 03/18/19    PT Start Time  0855    PT Stop Time  0940    PT Time Calculation (min)  45 min    Equipment Utilized During Treatment  Gait belt    Activity Tolerance  Patient tolerated treatment well;No increased pain    Behavior During Therapy  WFL for tasks assessed/performed       Past Medical History:  Diagnosis Date  . Balance problems   . Brain tumor ([place])    a. Left intraventricular tumor ->stable by 06/2017 MRI. Followed @ [facility].  . Gait disturbance    a. Unsteady on feet/balance difficulty.  . Generalized osteoarthritis of multiple sites   . GERD (gastroesophageal reflux disease)   . History of DVT (deep vein thrombosis) 2016   estrogen and airflights. Rx with xarelto for 3 months  . History of stress test    a. 11/2016 ETT: No ST/T changes.  Performed to assess PAC/PVC burden with activity ->No ectopy noted.  . Hypertension   . Hyperthyroidism    treated briefly in college  . Insomnia   . Obesity   . PAF (paroxysmal atrial fibrillation) ([place])    a. 12/2016 Event Monitor: brief run of PAF-->CHA2DS2VASc = 5--> Xarelto.  . Plantar fasciitis   . Retinal tear of left eye   . Rosacea   . Type 2 diabetes, controlled, with peripheral neuropathy ([place])    a. 11/2016 A1c 7.4.  . Uterine fibroid     Past Surgical History:  Procedure Laterality Date  . CARPAL TUNNEL RELEASE Right    Dr Burney Gauze  . CATARACT EXTRACTION    . RETINAL TEAR REPAIR CRYOTHERAPY     10/09/13, then again 3/15     There were no vitals filed for this visit.  Subjective Assessment - 04/24/19 0859    Subjective  Patient reports feeling better this morning with less soreness in right leg overall. She reports a twinge in low back;    Pertinent History  78 yo Female continues to have right knee pain; She went to orthopedic and had a repeat cortisone injection for right lateral meniscus tear. She reports injection was successful in that she is not having pain with weight bearing in right knee; She is still having right IT band pain; Pt reports that she should not need surgery at this time; She is still having clicking in both knees. She did get shoe inserts but states that they are not working well. She incrementally increased wear and states that they still bothered her. She reports that she hasn't been walking or exercising due to pain and fatigue; She reports not sleeping well and states that when she doesn't sleep well she is more unsteady and having difficulty with her balance; She is having left ankle cramping as a result of new inserts; She reports the pain is worse in thigh and knees after wearing inserts for longer period of time. She feels like it throws off her  balance and is exacerbating burning pain in legs; She also reports increased back pain;    Limitations  Standing;Walking    How long can you stand comfortably?  30 min with fatigue;    How long can you walk comfortably?  less than 1/2 a mile    Patient Stated Goals  "Be able to walk a mile a day."    Currently in Pain?  Yes    Pain Score  1     Pain Location  Back    Pain Orientation  Lower    Pain Descriptors / Indicators  Aching;Sore    Pain Type  Chronic pain    Pain Onset  More than a month ago    Pain Frequency  Intermittent    Aggravating Factors   worse with prolonged standing    Pain Relieving Factors  rest/heat    Effect of Pain on Daily Activities  no change;    Multiple Pain Sites  No            TREATMENT:  Patient  prone with pillow under stomach: -hamstring curl AROM x10 reps bilaterally; PT applied 1/2 bolster under right knee to facilitate increased quad stretch; -Passive quad stretch 20 sec hold x2 reps bilaterally; -Gluteal max hip extension (knee flexed) x10 reps x2 sets bilaterally;  Leg press: BLE plate115# 2x12with min VCs for correct positioning and to slow down LE movement for better motor control;  Patient sidelying: PT utilized rolling stick for soft tissue massage to right IT band/gluteal muscle x5 min; Increased soreness noted at right greater trochanter which was relieved with rolling stick soft tissue massage;  Standing: Heel off step calf stretch 20 sec hold x2 reps bilaterally;   Patient instructed in advanced balance exercise  Standing beside stairs:  Standing on airex foam: -Alternate march with 0 rail assist x10reps with min Vcs to increase hip flexion for better strength and balance challenge x10 reps bilaterally -modified tandem stance: head turns side/side x5 reps each foot in front; -modified tandem stance with ball pass side/side x10 reps each foot in front; Patient required min VCs for balance stability, including to increase trunk control for less loss of balance with smaller base of support  4 square stepping x3 laps clockwise; progressing to multiple directional stepping x2-3 min with close supervision for safety; patient exhibits good dynamic control without mis stepping with various directional changes;   Ladder drills: Forward reciprocal gait x1 laps Forward step with contralateral leg lift (SLS) 3 sec hold x2 laps, min A for safety and cues to improve core stabilization for better stance control Progress to forwards tep with contralateral leg lift SLS 3 sec hold with head turns side/side x2 laps with min A for safety;   Patient standing on firm surface tandem stance with eyes open head turns side/side x 5 sets with mild unsteadiness; Advanced HEP with  static standing exercise with tandem stance with eyes open/closed, head turns; Patient verbalized understanding;     Response to treatment: Patient tolerated well reporting less discomfort and less stiffness at end of session; Reinforced HEP with instruction to increase stretches after doing exercise for less soreness. Patient verbalized understanding. Also instructed patient to advance balance exercise with static and dynamic balance exercise. She exhibits better stability with dynamic movement, not misstepping with various directional changes.  Advanced HEP with balance exercise with modified tandem stance unsupported;  PT Education - 04/24/19 0902    Education Details  strengthening/balance/HEP reinforced;    Person(s) Educated  Patient    Methods  Explanation;Verbal cues    Comprehension  Verbalized understanding;Returned demonstration;Verbal cues required;Need further instruction       PT Short Term Goals - 04/17/19 0834      PT SHORT TERM GOAL #1   Title  Patient will be adherent to HEP at least 3x a week to improve functional strength and balance for better safety at home.    Time  4    Period  Weeks    Status  On-going    Target Date  04/16/19      PT SHORT TERM GOAL #2   Title  Patient will increase RLE knee AROM extension to within 5 degrees of neutral for better standing and walking tolerance;     Time  4    Period  Weeks    Status  Achieved    Target Date  04/16/19      PT SHORT TERM GOAL #3   Title  Patient will reports maximum of 1 sleep disturbances per night over last 3 days to exhibit improved sleeping tolerance with less knee pain and reduced fatigue.     Baseline  7/22: usually wakes up to go to the bathroom and then unable to go back to sleep and get comfortable;    Time  4    Period  Weeks    Status  Not Met    Target Date  04/16/19        PT Long Term Goals - 04/17/19 0835      PT LONG TERM GOAL #1   Title   Patient will increase BLE gross strength to 4+/5 as to improve functional strength for independent gait, increased standing tolerance and increased ADL ability.    Time  8    Period  Weeks    Status  Partially Met    Target Date  05/14/19      PT LONG TERM GOAL #2   Title  Patient will increase lower extremity functional scale to >60/80 to demonstrate improved functional mobility and increased tolerance with ADLs.     Time  8    Period  Weeks    Status  Partially Met    Target Date  05/14/19      PT LONG TERM GOAL #3   Title  Patient will report a worst pain of 3/10 on VAS in      right knee       to improve tolerance with ADLs and reduced symptoms with activities.     Time  8    Period  Weeks    Status  Partially Met    Target Date  05/14/19      PT LONG TERM GOAL #4   Title  Patient will complete >1000 feet on 6 min walk test without increase in knee pain to improve community ambulator distance for functional tasks and return to PLOF.    Time  8    Period  Weeks    Status  Partially Met    Target Date  05/14/19            Plan - 04/24/19 0902    Clinical Impression Statement  Patient motivated and participated well within session. She was late this date. Patient does require min VCs for correct exercise technique and positioning; She does exhibit increased stiffness in LLE this date. Instructed patient  in advanced balance exercise. patient able to exhibit better stance control with SLS tasks but did have difficulty with head turns.Advanced HEP with tandem stance/modified tandem stance with static standing exercise. Patient would benefit from additional skilled PT Intervention to improve strength, balance and gait safety;    Personal Factors and Comorbidities  Age;Comorbidity 2;Fitness;Time since onset of injury/illness/exacerbation    Comorbidities  diabetes, imbalance    Examination-Activity Limitations  Squat;Lift;Stairs;Bend;Locomotion Level;Stand;Transfers;Sleep     Examination-Participation Restrictions  Cleaning;Community Activity;Shop;Church;Yard Work;Volunteer    Stability/Clinical Decision Making  Evolving/Moderate complexity    Rehab Potential  Good    Clinical Impairments Affecting Rehab Potential  motivated, good PLOF and good caregiver support    PT Frequency  2x / week    PT Duration  8 weeks    PT Treatment/Interventions  Aquatic Therapy;Cryotherapy;Electrical Stimulation;Moist Heat;Ultrasound;Gait training;Stair training;Functional mobility training;Therapeutic activities;Therapeutic exercise;Balance training;Neuromuscular re-education;Patient/family education;Orthotic Fit/Training;Manual techniques;Passive range of motion;Energy conservation;Taping    PT Home Exercise Plan  continue as previously given;    Consulted and Agree with Plan of Care  Patient       Patient will benefit from skilled therapeutic intervention in order to improve the following deficits and impairments:  Abnormal gait, Decreased activity tolerance, Pain, Difficulty walking, Decreased mobility, Decreased balance, Decreased range of motion, Impaired flexibility, Hypomobility, Decreased strength  Visit Diagnosis: 1. Acute pain of right knee   2. Muscle weakness (generalized)   3. Unsteadiness on feet   4. Difficulty in walking, not elsewhere classified        Problem List Patient Active Problem List   Diagnosis Date Noted  . Lumbar sprain 03/28/2019  . Dizziness   . Tinnitus, bilateral 11/28/2017  . Abnormal MRI of head 11/14/2017  . Meningioma ([place]) 09/21/2017  . Ataxia 05/11/2017  . PAF (paroxysmal atrial fibrillation) ([place]) 02/03/2017  . Preventative health care 06/02/2016  . Advance directive discussed with patient 06/02/2016  . Achilles tendonitis 03/11/2016  . Hypertension   . DVT, lower extremity, recurrent ([place])   . Type 2 diabetes, controlled, with peripheral neuropathy ([place])   . Generalized osteoarthritis of multiple sites   . GERD  (gastroesophageal reflux disease)   . Hyperthyroidism   . Retinal hole of right eye 06/26/2014  . Amblyopia, right 05/08/2014  . Status post intraocular lens implant 05/08/2014    Trotter,Margaret PT, DPT 04/24/2019, 10:03 AM  [place] MAIN [facility] SERVICES [address] [place], [place], [address] Phone: [phone]   Fax:  [phone]  Name: Angela Bentley MRN: [id number] Date of Birth: May 07, 1941

## 2019-04-29 ENCOUNTER — Ambulatory Visit: Payer: Medicare Other | Admitting: Physical Therapy

## 2019-04-30 ENCOUNTER — Other Ambulatory Visit: Payer: Self-pay | Admitting: Internal Medicine

## 2019-05-01 ENCOUNTER — Encounter: Payer: Self-pay | Admitting: Physical Therapy

## 2019-05-01 ENCOUNTER — Other Ambulatory Visit: Payer: Self-pay

## 2019-05-01 ENCOUNTER — Ambulatory Visit: Payer: Medicare Other | Attending: Obstetrics and Gynecology | Admitting: Physical Therapy

## 2019-05-01 DIAGNOSIS — M25561 Pain in right knee: Secondary | ICD-10-CM | POA: Diagnosis not present

## 2019-05-01 DIAGNOSIS — R2681 Unsteadiness on feet: Secondary | ICD-10-CM | POA: Diagnosis not present

## 2019-05-01 DIAGNOSIS — M6281 Muscle weakness (generalized): Secondary | ICD-10-CM | POA: Insufficient documentation

## 2019-05-01 DIAGNOSIS — R262 Difficulty in walking, not elsewhere classified: Secondary | ICD-10-CM | POA: Insufficient documentation

## 2019-05-01 NOTE — Therapy (Signed)
[place] MAIN [facility] SERVICES [address] [place], [place], [address] Phone: [phone]   Fax:  [phone]  Physical Therapy Treatment  Patient Details  Name: Angela Bentley MRN: [id number] Date of Birth: 1940/11/18 Referring Provider (PT): Dr. [name] PCP   Encounter Date: 05/01/2019  PT End of Session - 05/01/19 0855    Visit Number  13    Number of Visits  22    Date for PT Re-Evaluation  05/14/19    Authorization Type  goals last re-assessed 03/18/19    PT Start Time  0845    PT Stop Time  0930    PT Time Calculation (min)  45 min    Equipment Utilized During Treatment  Gait belt    Activity Tolerance  Patient tolerated treatment well;No increased pain    Behavior During Therapy  WFL for tasks assessed/performed       Past Medical History:  Diagnosis Date  . Balance problems   . Brain tumor ([place])    a. Left intraventricular tumor ->stable by 06/2017 MRI. Followed @ [facility].  . Gait disturbance    a. Unsteady on feet/balance difficulty.  . Generalized osteoarthritis of multiple sites   . GERD (gastroesophageal reflux disease)   . History of DVT (deep vein thrombosis) 2016   estrogen and airflights. Rx with xarelto for 3 months  . History of stress test    a. 11/2016 ETT: No ST/T changes.  Performed to assess PAC/PVC burden with activity ->No ectopy noted.  . Hypertension   . Hyperthyroidism    treated briefly in college  . Insomnia   . Obesity   . PAF (paroxysmal atrial fibrillation) ([place])    a. 12/2016 Event Monitor: brief run of PAF-->CHA2DS2VASc = 5--> Xarelto.  . Plantar fasciitis   . Retinal tear of left eye   . Rosacea   . Type 2 diabetes, controlled, with peripheral neuropathy ([place])    a. 11/2016 A1c 7.4.  . Uterine fibroid     Past Surgical History:  Procedure Laterality Date  . CARPAL TUNNEL RELEASE Right    Dr Burney Gauze  . CATARACT EXTRACTION    . RETINAL TEAR REPAIR CRYOTHERAPY     10/09/13, then again 3/15     There were no vitals filed for this visit.  Subjective Assessment - 05/01/19 0853    Subjective  Patient reports over the weekend she woke up with increased back pain and she isn't sure what brought that on, she is concerned she might have fallen; She reports less pain this morning;She states she has been taking it easy and trying to not sit as much;    Pertinent History  78 yo Female continues to have right knee pain; She went to orthopedic and had a repeat cortisone injection for right lateral meniscus tear. She reports injection was successful in that she is not having pain with weight bearing in right knee; She is still having right IT band pain; Pt reports that she should not need surgery at this time; She is still having clicking in both knees. She did get shoe inserts but states that they are not working well. She incrementally increased wear and states that they still bothered her. She reports that she hasn't been walking or exercising due to pain and fatigue; She reports not sleeping well and states that when she doesn't sleep well she is more unsteady and having difficulty with her balance; She is having left ankle cramping as a result of  new inserts; She reports the pain is worse in thigh and knees after wearing inserts for longer period of time. She feels like it throws off her balance and is exacerbating burning pain in legs; She also reports increased back pain;    Limitations  Standing;Walking    How long can you stand comfortably?  30 min with fatigue;    How long can you walk comfortably?  less than 1/2 a mile    Patient Stated Goals  "Be able to walk a mile a day."    Currently in Pain?  Yes    Pain Score  2     Pain Location  Back    Pain Orientation  Lower    Pain Descriptors / Indicators  Aching;Sore    Pain Type  Chronic pain    Pain Onset  More than a month ago    Pain Frequency  Intermittent    Aggravating Factors   worse with prolonged sitting/standing    Pain  Relieving Factors  rest/heat    Effect of Pain on Daily Activities  no change;    Multiple Pain Sites  No            TREATMENT: Warm up on Nustep BUE/BLE level 2 x4 min (Unbilled);   Patient prone with pillow under stomach: -hamstring curl AROM 2#x10 reps bilaterally;  -Passive quad stretch 20 sec hold x2 reps bilaterally; -Gluteal max hip extension (knee flexed) 2# x10 reps x2 sets bilaterally; -Hip extension SLR 2# x10 bilaterally; Patient required min-moderate verbal/tactile cues for correct exercise technique;   Patient sidelying: PT utilized rolling stick for soft tissue massage to right IT band/gluteal muscle x5 min; Increased soreness noted at right greater trochanter which was relieved with rolling stick soft tissue massage; Sidelying clamshells green tband 2x12 RLE only with min Vcs to avoid trunk rotation for better hip strengthening; patient denies any increase in knee pain with advanced exercise;   Standing: Heel off step calf stretch 20 sec hold x2 reps bilaterally;              Patient instructed in advanced balance exercise  Forward stepping over orange hurdles #2 reciprocal stepping x6 laps with supervision for safety; Required mod VCs to increase DF for better heel strike;   Response to treatment: Patient tolerated well reporting less discomfort and less stiffness at end of session;Reinforced HEP with instruction to increase stretches after doing exercise for less soreness. Patient had increased difficulty lifting LLE with prone hip extension, this is likely due to increased right low back pain with decreased multifidi firing with left hip extension compared to right hip extension; Patient verbalized understanding. Patient does exhibit instability with reciprocal stepping unsupported, occasionally veering side/side. She had increased difficulty clearing obstacles with cues for better heel strike, often trying to step over toe first.                             PT Education - 05/01/19 0855    Education Details  strengthening, balance/HEP reinforced;    Person(s) Educated  Patient    Methods  Explanation;Verbal cues    Comprehension  Verbalized understanding;Returned demonstration;Verbal cues required;Need further instruction       PT Short Term Goals - 04/17/19 0834      PT SHORT TERM GOAL #1   Title  Patient will be adherent to HEP at least 3x a week to improve functional strength and balance for better  safety at home.    Time  4    Period  Weeks    Status  On-going    Target Date  04/16/19      PT SHORT TERM GOAL #2   Title  Patient will increase RLE knee AROM extension to within 5 degrees of neutral for better standing and walking tolerance;     Time  4    Period  Weeks    Status  Achieved    Target Date  04/16/19      PT SHORT TERM GOAL #3   Title  Patient will reports maximum of 1 sleep disturbances per night over last 3 days to exhibit improved sleeping tolerance with less knee pain and reduced fatigue.     Baseline  7/22: usually wakes up to go to the bathroom and then unable to go back to sleep and get comfortable;    Time  4    Period  Weeks    Status  Not Met    Target Date  04/16/19        PT Long Term Goals - 04/17/19 0835      PT LONG TERM GOAL #1   Title  Patient will increase BLE gross strength to 4+/5 as to improve functional strength for independent gait, increased standing tolerance and increased ADL ability.    Time  8    Period  Weeks    Status  Partially Met    Target Date  05/14/19      PT LONG TERM GOAL #2   Title  Patient will increase lower extremity functional scale to >60/80 to demonstrate improved functional mobility and increased tolerance with ADLs.     Time  8    Period  Weeks    Status  Partially Met    Target Date  05/14/19      PT LONG TERM GOAL #3   Title  Patient will report a worst pain of 3/10 on VAS in      right knee       to  improve tolerance with ADLs and reduced symptoms with activities.     Time  8    Period  Weeks    Status  Partially Met    Target Date  05/14/19      PT LONG TERM GOAL #4   Title  Patient will complete >1000 feet on 6 min walk test without increase in knee pain to improve community ambulator distance for functional tasks and return to PLOF.    Time  8    Period  Weeks    Status  Partially Met    Target Date  05/14/19            Plan - 05/02/19 0839    Clinical Impression Statement  Patient motivated and participated well within session; Advanced LE strengthening with increased resistance/repetition. Patient does require min VCs for proper exercise technique. She denies any increase in pain with advanced exercise. Able to progress with increased resistance. Patient does exhibit increased unsteadiness with reciprocal stepping especially unsupported and with cues to increase heel strike. Patient would benefit from additional skilled PT intervention to improve strength, balance and mobility;    Personal Factors and Comorbidities  Age;Comorbidity 2;Fitness;Time since onset of injury/illness/exacerbation    Comorbidities  diabetes, imbalance    Examination-Activity Limitations  Squat;Lift;Stairs;Bend;Locomotion Level;Stand;Transfers;Sleep    Examination-Participation Restrictions  Cleaning;Community Activity;Shop;Church;Yard Work;Volunteer    Stability/Clinical Decision Making  Evolving/Moderate complexity    Rehab  Potential  Good    Clinical Impairments Affecting Rehab Potential  motivated, good PLOF and good caregiver support    PT Frequency  2x / week    PT Duration  8 weeks    PT Treatment/Interventions  Aquatic Therapy;Cryotherapy;Electrical Stimulation;Moist Heat;Ultrasound;Gait training;Stair training;Functional mobility training;Therapeutic activities;Therapeutic exercise;Balance training;Neuromuscular re-education;Patient/family education;Orthotic Fit/Training;Manual  techniques;Passive range of motion;Energy conservation;Taping    PT Home Exercise Plan  continue as previously given;    Consulted and Agree with Plan of Care  Patient       Patient will benefit from skilled therapeutic intervention in order to improve the following deficits and impairments:  Abnormal gait, Decreased activity tolerance, Pain, Difficulty walking, Decreased mobility, Decreased balance, Decreased range of motion, Impaired flexibility, Hypomobility, Decreased strength  Visit Diagnosis: 1. Acute pain of right knee   2. Muscle weakness (generalized)   3. Unsteadiness on feet   4. Difficulty in walking, not elsewhere classified        Problem List Patient Active Problem List   Diagnosis Date Noted  . Lumbar sprain 03/28/2019  . Dizziness   . Tinnitus, bilateral 11/28/2017  . Abnormal MRI of head 11/14/2017  . Meningioma ([place]) 09/21/2017  . Ataxia 05/11/2017  . PAF (paroxysmal atrial fibrillation) ([place]) 02/03/2017  . Preventative health care 06/02/2016  . Advance directive discussed with patient 06/02/2016  . Achilles tendonitis 03/11/2016  . Hypertension   . DVT, lower extremity, recurrent ([place])   . Type 2 diabetes, controlled, with peripheral neuropathy ([place])   . Generalized osteoarthritis of multiple sites   . GERD (gastroesophageal reflux disease)   . Hyperthyroidism   . Retinal hole of right eye 06/26/2014  . Amblyopia, right 05/08/2014  . Status post intraocular lens implant 05/08/2014    Perle Brickhouse PT, DPT 05/02/2019, 8:45 AM  [place] MAIN [facility] SERVICES [address] [place], [place], [address] Phone: [phone]   Fax:  [phone]  Name: Angela Bentley MRN: [id number] Date of Birth: [birth year]

## 2019-05-07 ENCOUNTER — Encounter: Payer: Self-pay | Admitting: Physical Therapy

## 2019-05-07 ENCOUNTER — Ambulatory Visit: Payer: Medicare Other | Admitting: Physical Therapy

## 2019-05-07 ENCOUNTER — Other Ambulatory Visit: Payer: Self-pay

## 2019-05-07 DIAGNOSIS — M25561 Pain in right knee: Secondary | ICD-10-CM

## 2019-05-07 DIAGNOSIS — R262 Difficulty in walking, not elsewhere classified: Secondary | ICD-10-CM

## 2019-05-07 DIAGNOSIS — R2681 Unsteadiness on feet: Secondary | ICD-10-CM

## 2019-05-07 DIAGNOSIS — M6281 Muscle weakness (generalized): Secondary | ICD-10-CM

## 2019-05-07 NOTE — Therapy (Signed)
[place] MAIN [facility] SERVICES [address] [place], [place], [address] Phone: [phone]   Fax:  [phone]  Physical Therapy Treatment  Patient Details  Name: Angela Bentley MRN: [id number] Date of Birth: September 19, 1941 Referring Provider (PT): Dr. [name] PCP   Encounter Date: 05/07/2019  PT End of Session - 05/07/19 0858    Visit Number  14    Number of Visits  22    Date for PT Re-Evaluation  05/14/19    Authorization Type  goals last re-assessed 03/18/19    PT Start Time  0845    PT Stop Time  0930    PT Time Calculation (min)  45 min    Equipment Utilized During Treatment  Gait belt    Activity Tolerance  Patient tolerated treatment well;No increased pain    Behavior During Therapy  WFL for tasks assessed/performed       Past Medical History:  Diagnosis Date  . Balance problems   . Brain tumor ([place])    a. Left intraventricular tumor ->stable by 06/2017 MRI. Followed @ [facility].  . Gait disturbance    a. Unsteady on feet/balance difficulty.  . Generalized osteoarthritis of multiple sites   . GERD (gastroesophageal reflux disease)   . History of DVT (deep vein thrombosis) 2016   estrogen and airflights. Rx with xarelto for 3 months  . History of stress test    a. 11/2016 ETT: No ST/T changes.  Performed to assess PAC/PVC burden with activity ->No ectopy noted.  . Hypertension   . Hyperthyroidism    treated briefly in college  . Insomnia   . Obesity   . PAF (paroxysmal atrial fibrillation) ([place])    a. 12/2016 Event Monitor: brief run of PAF-->CHA2DS2VASc = 5--> Xarelto.  . Plantar fasciitis   . Retinal tear of left eye   . Rosacea   . Type 2 diabetes, controlled, with peripheral neuropathy ([place])    a. 11/2016 A1c 7.4.  . Uterine fibroid     Past Surgical History:  Procedure Laterality Date  . CARPAL TUNNEL RELEASE Right    Dr Burney Gauze  . CATARACT EXTRACTION    . RETINAL TEAR REPAIR CRYOTHERAPY     10/09/13, then again 3/15     There were no vitals filed for this visit.  Subjective Assessment - 05/07/19 0855    Subjective  Patient reports going to the [place] and having to go up/down a lot of stairs as it is a 3 story home with no bathroom on the main floor. She reports some soreness in front of thigh from tightness from lots of walking and stair negotiation;    Pertinent History  78 yo Female continues to have right knee pain; She went to orthopedic and had a repeat cortisone injection for right lateral meniscus tear. She reports injection was successful in that she is not having pain with weight bearing in right knee; She is still having right IT band pain; Pt reports that she should not need surgery at this time; She is still having clicking in both knees. She did get shoe inserts but states that they are not working well. She incrementally increased wear and states that they still bothered her. She reports that she hasn't been walking or exercising due to pain and fatigue; She reports not sleeping well and states that when she doesn't sleep well she is more unsteady and having difficulty with her balance; She is having left ankle cramping as a result of  new inserts; She reports the pain is worse in thigh and knees after wearing inserts for longer period of time. She feels like it throws off her balance and is exacerbating burning pain in legs; She also reports increased back pain;    Limitations  Standing;Walking    How long can you stand comfortably?  30 min with fatigue;    How long can you walk comfortably?  less than 1/2 a mile    Patient Stated Goals  "Be able to walk a mile a day."    Currently in Pain?  No/denies    Pain Onset  More than a month ago    Multiple Pain Sites  No          TREATMENT:  Patient prone with pillow under stomach: -hamstring curl AROM 2#x10 reps bilaterally;  -Passive quad stretch 20 sec hold x2 reps bilaterally; -Gluteal max hip extension (knee flexed) 2# x15 reps x1 sets  bilaterally; -Hip extension SLR 2# x12 bilaterally; Patient required min-moderate verbal/tactile cues for correct exercise technique;  Supine: PT instructed patient in modified piriformis stretch 20 sec hold x2 reps bilaterally Hamstring neural stretch with SLR hip flexion with ankle DF/PF x10 reps each LE for neural flossing;  Required min VCs for proper positioning for optimal stretch and flexibility;     Standing: Heel off step calf stretch 20 sec hold x2 reps bilaterally;   Forward/backward diagonal stepping green tband x10 feet x 2 laps unsupported with cues for sequencing; able to exhibit good positioning and knee control without increase in pain;   Patient instructed in advanced balance exercise  Standing on airex: Modified tandem stance  Eyes open/closed 10 sec hold x2 sets each foot in front;  Head turns side/side, up/down x5 reps each foot in front;   Holding ball in BUE, trunk rotation side/side x5 reps each foot in front;  Tossing ball up/down and catching with BUE x5 reps each foot in front; Patient exhibits improved static standing balance with less lateral loss of balance with eyes closed being able to stand on compliant surface unsupported with close supervision; She did have increased difficulty with trunk rotation outside base of support requiring min A for safety; Patient does exhibit slight increase in difficulty with left foot ahead of right foot with increased lateral loss of balance;   Marching in place on firm surface x10 reps with cues to slow down LE movement to challenge SLS control;  Increased difficulty with side/side ball pass x3-5 reps each direction while marching in place; patient exhibits uneven cadence and decreased foot clearance with ball pass with slight loss of balance laterally; She required CGA for safety and min VCs for sequencing and correct activity technique;   Response to treatment: Patient tolerated well reporting less  discomfort and less stiffness at end of session;Reinforced HEP with instruction to increase stretches after doing exercise for less soreness. patient exhibits improved stance control which is likely a result from HEP adherence. She did have increased difficulty with dual task activities including marching with ball pass with lateral loss of balance requiring CGA for safety; She would benefit from additional balance challenge with dynamic activity/dual task exercise.                        PT Education - 05/07/19 0857    Education Details  strengthening, balance/HEP reinforced;    Person(s) Educated  Patient    Methods  Explanation;Verbal cues    Comprehension  Verbalized understanding;Returned demonstration;Verbal cues required;Need further instruction       PT Short Term Goals - 04/17/19 0834      PT SHORT TERM GOAL #1   Title  Patient will be adherent to HEP at least 3x a week to improve functional strength and balance for better safety at home.    Time  4    Period  Weeks    Status  On-going    Target Date  04/16/19      PT SHORT TERM GOAL #2   Title  Patient will increase RLE knee AROM extension to within 5 degrees of neutral for better standing and walking tolerance;     Time  4    Period  Weeks    Status  Achieved    Target Date  04/16/19      PT SHORT TERM GOAL #3   Title  Patient will reports maximum of 1 sleep disturbances per night over last 3 days to exhibit improved sleeping tolerance with less knee pain and reduced fatigue.     Baseline  7/22: usually wakes up to go to the bathroom and then unable to go back to sleep and get comfortable;    Time  4    Period  Weeks    Status  Not Met    Target Date  04/16/19        PT Long Term Goals - 04/17/19 0835      PT LONG TERM GOAL #1   Title  Patient will increase BLE gross strength to 4+/5 as to improve functional strength for independent gait, increased standing tolerance and increased ADL  ability.    Time  8    Period  Weeks    Status  Partially Met    Target Date  05/14/19      PT LONG TERM GOAL #2   Title  Patient will increase lower extremity functional scale to >60/80 to demonstrate improved functional mobility and increased tolerance with ADLs.     Time  8    Period  Weeks    Status  Partially Met    Target Date  05/14/19      PT LONG TERM GOAL #3   Title  Patient will report a worst pain of 3/10 on VAS in      right knee       to improve tolerance with ADLs and reduced symptoms with activities.     Time  8    Period  Weeks    Status  Partially Met    Target Date  05/14/19      PT LONG TERM GOAL #4   Title  Patient will complete >1000 feet on 6 min walk test without increase in knee pain to improve community ambulator distance for functional tasks and return to PLOF.    Time  8    Period  Weeks    Status  Partially Met    Target Date  05/14/19            Plan - 05/07/19 1021    Clinical Impression Statement  patient motivated and participated well within session; Patient instructed in advanced LE strengthening exercise. She is able to exhibit improved AROM with less stiffness. Patient did exhibit increased tightness in quad muscles today tolerating stretches well reporting less stiffness at end of session. Progressed balance exercise with increased dynamic movement with dual task to challenge coordination. Patient does exhibit increased unsteadiness iwth dual task. She does  however, exhibit improved stance control which is likely a result from adherence with HEP; She would benefit from additional skilled PT Intervention to improve strength, balance and mobility;    Personal Factors and Comorbidities  Age;Comorbidity 2;Fitness;Time since onset of injury/illness/exacerbation    Comorbidities  diabetes, imbalance    Examination-Activity Limitations  Squat;Lift;Stairs;Bend;Locomotion Level;Stand;Transfers;Sleep    Examination-Participation Restrictions   Cleaning;Community Activity;Shop;Church;Yard Work;Volunteer    Stability/Clinical Decision Making  Evolving/Moderate complexity    Rehab Potential  Good    Clinical Impairments Affecting Rehab Potential  motivated, good PLOF and good caregiver support    PT Frequency  2x / week    PT Duration  8 weeks    PT Treatment/Interventions  Aquatic Therapy;Cryotherapy;Electrical Stimulation;Moist Heat;Ultrasound;Gait training;Stair training;Functional mobility training;Therapeutic activities;Therapeutic exercise;Balance training;Neuromuscular re-education;Patient/family education;Orthotic Fit/Training;Manual techniques;Passive range of motion;Energy conservation;Taping    PT Home Exercise Plan  continue as previously given;    Consulted and Agree with Plan of Care  Patient       Patient will benefit from skilled therapeutic intervention in order to improve the following deficits and impairments:  Abnormal gait, Decreased activity tolerance, Pain, Difficulty walking, Decreased mobility, Decreased balance, Decreased range of motion, Impaired flexibility, Hypomobility, Decreased strength  Visit Diagnosis: 1. Acute pain of right knee   2. Muscle weakness (generalized)   3. Unsteadiness on feet   4. Difficulty in walking, not elsewhere classified        Problem List Patient Active Problem List   Diagnosis Date Noted  . Lumbar sprain 03/28/2019  . Dizziness   . Tinnitus, bilateral 11/28/2017  . Abnormal MRI of head 11/14/2017  . Meningioma ([place]) 09/21/2017  . Ataxia 05/11/2017  . PAF (paroxysmal atrial fibrillation) ([place]) 02/03/2017  . Preventative health care 06/02/2016  . Advance directive discussed with patient 06/02/2016  . Achilles tendonitis 03/11/2016  . Hypertension   . DVT, lower extremity, recurrent ([place])   . Type 2 diabetes, controlled, with peripheral neuropathy ([place])   . Generalized osteoarthritis of multiple sites   . GERD (gastroesophageal reflux disease)   . Hyperthyroidism    . Retinal hole of right eye 06/26/2014  . Amblyopia, right 05/08/2014  . Status post intraocular lens implant 05/08/2014    Dianne Bady PT, DPT 05/07/2019, 10:29 AM  [place] MAIN [facility] SERVICES [address] [place], [place], [address] Phone: [phone]   Fax:  [phone]  Name: Coreen Shippee MRN: [id number] Date of Birth: Sep 04, 1941

## 2019-05-09 ENCOUNTER — Ambulatory Visit: Payer: Medicare Other

## 2019-05-09 ENCOUNTER — Other Ambulatory Visit: Payer: Self-pay

## 2019-05-09 DIAGNOSIS — M6281 Muscle weakness (generalized): Secondary | ICD-10-CM

## 2019-05-09 DIAGNOSIS — R2681 Unsteadiness on feet: Secondary | ICD-10-CM

## 2019-05-09 DIAGNOSIS — R262 Difficulty in walking, not elsewhere classified: Secondary | ICD-10-CM | POA: Diagnosis not present

## 2019-05-09 DIAGNOSIS — M25561 Pain in right knee: Secondary | ICD-10-CM

## 2019-05-09 NOTE — Therapy (Signed)
[place] MAIN [facility] SERVICES [address] [place], [place], [address] Phone: [phone]   Fax:  [phone]  Physical Therapy Treatment  Patient Details  Name: Angela Bentley MRN: [id number] Date of Birth: 06-22-1941 Referring Provider (PT): Dr. [name] PCP   Encounter Date: 05/09/2019  PT End of Session - 05/09/19 0900    Visit Number  15    Number of Visits  22    Date for PT Re-Evaluation  05/14/19    Authorization Type  goals last re-assessed 03/18/19    PT Start Time  0851    PT Stop Time  0930    PT Time Calculation (min)  39 min    Equipment Utilized During Treatment  Gait belt    Activity Tolerance  Patient tolerated treatment well;No increased pain    Behavior During Therapy  WFL for tasks assessed/performed       Past Medical History:  Diagnosis Date  . Balance problems   . Brain tumor ([place])    a. Left intraventricular tumor ->stable by 06/2017 MRI. Followed @ [facility].  . Gait disturbance    a. Unsteady on feet/balance difficulty.  . Generalized osteoarthritis of multiple sites   . GERD (gastroesophageal reflux disease)   . History of DVT (deep vein thrombosis) 2016   estrogen and airflights. Rx with xarelto for 3 months  . History of stress test    a. 11/2016 ETT: No ST/T changes.  Performed to assess PAC/PVC burden with activity ->No ectopy noted.  . Hypertension   . Hyperthyroidism    treated briefly in college  . Insomnia   . Obesity   . PAF (paroxysmal atrial fibrillation) ([place])    a. 12/2016 Event Monitor: brief run of PAF-->CHA2DS2VASc = 5--> Xarelto.  . Plantar fasciitis   . Retinal tear of left eye   . Rosacea   . Type 2 diabetes, controlled, with peripheral neuropathy ([place])    a. 11/2016 A1c 7.4.  . Uterine fibroid     Past Surgical History:  Procedure Laterality Date  . CARPAL TUNNEL RELEASE Right    Dr Burney Gauze  . CATARACT EXTRACTION    . RETINAL TEAR REPAIR CRYOTHERAPY     10/09/13, then again 3/15     There were no vitals filed for this visit.  Subjective Assessment - 05/09/19 0856    Subjective  Pt reports trying out her new recumbent bike for 5 minutes, some mild leg soreness but no cardiovascular challenge yet.    Pertinent History  78 yo Female continues to have right knee pain; She went to orthopedic and had a repeat cortisone injection for right lateral meniscus tear. She reports injection was successful in that she is not having pain with weight bearing in right knee; She is still having right IT band pain; Pt reports that she should not need surgery at this time; She is still having clicking in both knees. She did get shoe inserts but states that they are not working well. She incrementally increased wear and states that they still bothered her. She reports that she hasn't been walking or exercising due to pain and fatigue; She reports not sleeping well and states that when she doesn't sleep well she is more unsteady and having difficulty with her balance; She is having left ankle cramping as a result of new inserts; She reports the pain is worse in thigh and knees after wearing inserts for longer period of time. She feels like it throws off her  balance and is exacerbating burning pain in legs; She also reports increased back pain;    Currently in Pain?  Yes    Pain Score  2     Pain Location  Back   Pt reports maybe stirred up from the bike.     TREATMENT: Patient prone with pillow under stomach: -hamstring curl AROM2# 2x10 reps bilaterally; increased to 1x10 c 3lb -Passive quad stretch 30 sec hold x2 reps bilaterally; self assist with gait belt, towel roll under forehead -Gluteal max hip extension (knee flexed)2#x15 reps x1 set bilaterally; *active assistance from author for full end rang eavailability Patient required min-moderate verbal/tactile cues for correct exercise technique; -Prone Left hip anterior glide stretch 2x45sec (to improve active extension ROM) -Active  release techniques to the left vastus lateralis in prone to improve pain and ROM in Left knee flexion (2 minutes)   Standing: Heel off step calf stretch 20 sec hold x2 reps bilaterally;  Standing on airex: Modified tandem stance             Eyes open/closed 10 sec hold x3 sets each foot in front;             Head turns side/side, up/down x5 reps each foot in front;              Holding ball in BUE, trunk rotation side/side x5 reps each foot in front;             Tossing ball up/down and catching with BUE x5 reps each foot in front;    PT Short Term Goals - 04/17/19 0834      PT SHORT TERM GOAL #1   Title  Patient will be adherent to HEP at least 3x a week to improve functional strength and balance for better safety at home.    Time  4    Period  Weeks    Status  On-going    Target Date  04/16/19      PT SHORT TERM GOAL #2   Title  Patient will increase RLE knee AROM extension to within 5 degrees of neutral for better standing and walking tolerance;     Time  4    Period  Weeks    Status  Achieved    Target Date  04/16/19      PT SHORT TERM GOAL #3   Title  Patient will reports maximum of 1 sleep disturbances per night over last 3 days to exhibit improved sleeping tolerance with less knee pain and reduced fatigue.     Baseline  7/22: usually wakes up to go to the bathroom and then unable to go back to sleep and get comfortable;    Time  4    Period  Weeks    Status  Not Met    Target Date  04/16/19        PT Long Term Goals - 04/17/19 0835      PT LONG TERM GOAL #1   Title  Patient will increase BLE gross strength to 4+/5 as to improve functional strength for independent gait, increased standing tolerance and increased ADL ability.    Time  8    Period  Weeks    Status  Partially Met    Target Date  05/14/19      PT LONG TERM GOAL #2   Title  Patient will increase lower extremity functional scale to >60/80 to demonstrate improved functional mobility and increased  tolerance with ADLs.  Time  8    Period  Weeks    Status  Partially Met    Target Date  05/14/19      PT LONG TERM GOAL #3   Title  Patient will report a worst pain of 3/10 on VAS in      right knee       to improve tolerance with ADLs and reduced symptoms with activities.     Time  8    Period  Weeks    Status  Partially Met    Target Date  05/14/19      PT LONG TERM GOAL #4   Title  Patient will complete >1000 feet on 6 min walk test without increase in knee pain to improve community ambulator distance for functional tasks and return to PLOF.    Time  8    Period  Weeks    Status  Partially Met    Target Date  05/14/19            Plan - 05/09/19 0907    Clinical Impression Statement  Continued with current program as in prior session. Pt continues ot demonstrate gradual improvements in strength, balance, and pain reduction. Pt remains motivated, and follow multimodal cues for instruction for exercises, independent with reps and sets.    Rehab Potential  Good    Clinical Impairments Affecting Rehab Potential  motivated, good PLOF and good caregiver support    PT Frequency  2x / week    PT Duration  8 weeks    PT Treatment/Interventions  Aquatic Therapy;Cryotherapy;Electrical Stimulation;Moist Heat;Ultrasound;Gait training;Stair training;Functional mobility training;Therapeutic activities;Therapeutic exercise;Balance training;Neuromuscular re-education;Patient/family education;Orthotic Fit/Training;Manual techniques;Passive range of motion;Energy conservation;Taping    PT Next Visit Plan  Revisit HEP assure proper performance and no symptoms aggravation     PT Home Exercise Plan  continue as previously given;    Consulted and Agree with Plan of Care  Patient       Patient will benefit from skilled therapeutic intervention in order to improve the following deficits and impairments:  Abnormal gait, Decreased activity tolerance, Pain, Difficulty walking, Decreased mobility,  Decreased balance, Decreased range of motion, Impaired flexibility, Hypomobility, Decreased strength  Visit Diagnosis: 1. Acute pain of right knee   2. Muscle weakness (generalized)   3. Unsteadiness on feet   4. Difficulty in walking, not elsewhere classified        Problem List Patient Active Problem List   Diagnosis Date Noted  . Lumbar sprain 03/28/2019  . Dizziness   . Tinnitus, bilateral 11/28/2017  . Abnormal MRI of head 11/14/2017  . Meningioma ([facility]) 09/21/2017  . Ataxia 05/11/2017  . PAF (paroxysmal atrial fibrillation) ([facility]) 02/03/2017  . Preventative health care 06/02/2016  . Advance directive discussed with patient 06/02/2016  . Achilles tendonitis 03/11/2016  . Hypertension   . DVT, lower extremity, recurrent ([facility])   . Type 2 diabetes, controlled, with peripheral neuropathy ([facility])   . Generalized osteoarthritis of multiple sites   . GERD (gastroesophageal reflux disease)   . Hyperthyroidism   . Retinal hole of right eye 06/26/2014  . Amblyopia, right 05/08/2014  . Status post intraocular lens implant 05/08/2014    Buccola,Allan C 05/09/2019, 9:27 AM  [place] RE[facility] SERVICES [address] [place], [place], [address] Phone: [phone]   Fax:  [phone]  Name: Carle Lubin MRN: [id number] Date of Birth: 11/15/1940   

## 2019-05-14 ENCOUNTER — Ambulatory Visit: Payer: Medicare Other | Admitting: Physical Therapy

## 2019-05-14 ENCOUNTER — Encounter: Payer: Self-pay | Admitting: Physical Therapy

## 2019-05-14 ENCOUNTER — Other Ambulatory Visit: Payer: Self-pay

## 2019-05-14 DIAGNOSIS — R262 Difficulty in walking, not elsewhere classified: Secondary | ICD-10-CM | POA: Diagnosis not present

## 2019-05-14 DIAGNOSIS — R2681 Unsteadiness on feet: Secondary | ICD-10-CM | POA: Diagnosis not present

## 2019-05-14 DIAGNOSIS — M6281 Muscle weakness (generalized): Secondary | ICD-10-CM | POA: Diagnosis not present

## 2019-05-14 DIAGNOSIS — M25561 Pain in right knee: Secondary | ICD-10-CM | POA: Diagnosis not present

## 2019-05-14 NOTE — Therapy (Signed)
[place] MAIN [facility] SERVICES [address] [place], [place], [address] Phone: [phone]   Fax:  [phone]  Physical Therapy Treatment  Patient Details  Name: Angela Bentley MRN: [id number] Date of Birth: 06-18-1941 Referring Provider (PT): Dr. [name] PCP   Encounter Date: 05/14/2019  PT End of Session - 05/14/19 0910    Visit Number  16    Number of Visits  30    Date for PT Re-Evaluation  07/09/19    Authorization Type  goals last re-assessed 05/14/19    PT Start Time  0846    PT Stop Time  0930    PT Time Calculation (min)  44 min    Equipment Utilized During Treatment  Gait belt    Activity Tolerance  Patient tolerated treatment well;No increased pain    Behavior During Therapy  WFL for tasks assessed/performed       Past Medical History:  Diagnosis Date  . Balance problems   . Brain tumor ([place])    a. Left intraventricular tumor ->stable by 06/2017 MRI. Followed @ [facility].  . Gait disturbance    a. Unsteady on feet/balance difficulty.  . Generalized osteoarthritis of multiple sites   . GERD (gastroesophageal reflux disease)   . History of DVT (deep vein thrombosis) 2016   estrogen and airflights. Rx with xarelto for 3 months  . History of stress test    a. 11/2016 ETT: No ST/T changes.  Performed to assess PAC/PVC burden with activity ->No ectopy noted.  . Hypertension   . Hyperthyroidism    treated briefly in college  . Insomnia   . Obesity   . PAF (paroxysmal atrial fibrillation) ([place])    a. 12/2016 Event Monitor: brief run of PAF-->CHA2DS2VASc = 5--> Xarelto.  . Plantar fasciitis   . Retinal tear of left eye   . Rosacea   . Type 2 diabetes, controlled, with peripheral neuropathy ([place])    a. 11/2016 A1c 7.4.  . Uterine fibroid     Past Surgical History:  Procedure Laterality Date  . CARPAL TUNNEL RELEASE Right    Dr Burney Gauze  . CATARACT EXTRACTION    . RETINAL TEAR REPAIR CRYOTHERAPY     10/09/13, then again 3/15     There were no vitals filed for this visit.  Subjective Assessment - 05/14/19 0902    Subjective  Patient reports slight increase in knee discomfort which she feels could be related to using bike and/or wearing new shoes; She reports having increased soreness at night and it is better during the day;    Pertinent History  78 yo Female continues to have right knee pain; She went to orthopedic and had a repeat cortisone injection for right lateral meniscus tear. She reports injection was successful in that she is not having pain with weight bearing in right knee; She is still having right IT band pain; Pt reports that she should not need surgery at this time; She is still having clicking in both knees. She did get shoe inserts but states that they are not working well. She incrementally increased wear and states that they still bothered her. She reports that she hasn't been walking or exercising due to pain and fatigue; She reports not sleeping well and states that when she doesn't sleep well she is more unsteady and having difficulty with her balance; She is having left ankle cramping as a result of new inserts; She reports the pain is worse in thigh and knees after  wearing inserts for longer period of time. She feels like it throws off her balance and is exacerbating burning pain in legs; She also reports increased back pain;    Currently in Pain?  Yes    Pain Score  3     Pain Location  Knee    Pain Orientation  Right    Pain Descriptors / Indicators  Aching;Sore    Pain Type  Chronic pain    Pain Onset  More than a month ago    Pain Frequency  Intermittent    Aggravating Factors   worse with prolonged sitting/standing    Pain Relieving Factors  rest/heat    Effect of Pain on Daily Activities  no change;    Multiple Pain Sites  No         [facility] PT Assessment - 05/14/19 0001      Observation/Other Assessments   Lower Extremity Functional Scale   50/80 (the lower the score the greater the  disability, improved from 43/80 on 04/17/19      Strength   Overall Strength Comments  BLE are WFL with exception: R: hip abduction 4/5, extension: 3-/5,       6 minute walk test results    Aerobic Endurance Distance Walked  1200    Endurance additional comments  with increased shortness of breath, does exhibit slight foot drag on LLE, no increase in knee pain;          TREATMENT: Instructed patient in 6 min walk and assessed goals, see above;  Assessed LE strength, see above;   Patient prone with pillow under stomach: -Passive quad stretch 20 sec hold x2 reps bilaterally; Patient reports mild tenderness to quads;  PT instructed patient in plan of care;   Response to treatment: Patient tolerated well reporting less discomfort and less stiffness at end of session;Reinforced HEP with instruction to increase stretches after doing exercise for less soreness.patient exhibits improved stance control which is likely a result from HEP adherence. She would benefit from additional skilled intervention to address strength and balance;                   PT Education - 05/14/19 0909    Education Details  progress towards goals, HEP    Person(s) Educated  Patient    Methods  Explanation    Comprehension  Verbalized understanding       PT Short Term Goals - 05/14/19 0900      PT SHORT TERM GOAL #1   Title  Patient will be adherent to HEP at least 3x a week to improve functional strength and balance for better safety at home.    Baseline  doing most every day;    Time  4    Period  Weeks    Status  Achieved    Target Date  04/16/19      PT SHORT TERM GOAL #2   Title  Patient will increase RLE knee AROM extension to within 5 degrees of neutral for better standing and walking tolerance;     Time  4    Period  Weeks    Status  Achieved    Target Date  04/16/19      PT SHORT TERM GOAL #3   Title  Patient will reports maximum of 1 sleep disturbances per night over  last 3 days to exhibit improved sleeping tolerance with less knee pain and reduced fatigue.     Baseline  7/22: usually wakes  up to go to the bathroom and then unable to go back to sleep and get comfortable; 8/18: has had increased knee soreness which has caused increased  sleep disturbances;    Time  4    Period  Weeks    Status  Not Met    Target Date  04/16/19        PT Long Term Goals - 05/14/19 0905      PT LONG TERM GOAL #1   Title  Patient will increase BLE gross strength to 4+/5 as to improve functional strength for independent gait, increased standing tolerance and increased ADL ability.    Time  8    Period  Weeks    Status  Partially Met    Target Date  07/09/19      PT LONG TERM GOAL #2   Title  Patient will increase lower extremity functional scale to >60/80 to demonstrate improved functional mobility and increased tolerance with ADLs.     Baseline  7/22: 43/80, 05/14/19: 50/80    Time  8    Period  Weeks    Status  Partially Met    Target Date  07/09/19      PT LONG TERM GOAL #3   Title  Patient will report a worst pain of 3/10 on VAS in      right knee       to improve tolerance with ADLs and reduced symptoms with activities.     Baseline  05/14/19: increased ache at night 4/10    Time  8    Period  Weeks    Status  Not Met    Target Date  07/09/19      PT LONG TERM GOAL #4   Title  Patient will complete >1000 feet on 6 min walk test without increase in knee pain to improve community ambulator distance for functional tasks and return to PLOF.    Time  8    Period  Weeks    Status  Achieved    Target Date  07/09/19      PT LONG TERM GOAL #5   Title  Patient will increase Functional Gait Assessment score to >20/30 as to reduce fall risk and improve dynamic gait safety with community ambulation.    Time  8    Period  Weeks    Status  New    Target Date  07/09/19            Plan - 05/14/19 1605    Clinical Impression Statement  Patient motivated and  participated well within session. She is making progress towards all goals, however does report increased right knee pain this session which could be related to new footwear, using bike at home; She reports adherence with HEP; She has been making progress with improved walking tolerance and improved mobility; She does continue to have imbalance and feeling unsteady especially with fatigue.Plan to recert patient for 1x a week for 8 weeks to continue addressing HEP and to progress strength and balance.    Rehab Potential  Good    Clinical Impairments Affecting Rehab Potential  motivated, good PLOF and good caregiver support    PT Frequency  1x / week    PT Duration  8 weeks    PT Treatment/Interventions  Aquatic Therapy;Cryotherapy;Electrical Stimulation;Moist Heat;Ultrasound;Gait training;Stair training;Functional mobility training;Therapeutic activities;Therapeutic exercise;Balance training;Neuromuscular re-education;Patient/family education;Orthotic Fit/Training;Manual techniques;Passive range of motion;Energy conservation;Taping    PT Next Visit Plan  Revisit HEP assure proper performance and  no symptoms aggravation     PT Home Exercise Plan  continue as previously given;    Consulted and Agree with Plan of Care  Patient       Patient will benefit from skilled therapeutic intervention in order to improve the following deficits and impairments:  Abnormal gait, Decreased activity tolerance, Pain, Difficulty walking, Decreased mobility, Decreased balance, Decreased range of motion, Impaired flexibility, Hypomobility, Decreased strength  Visit Diagnosis: 1. Acute pain of right knee   2. Muscle weakness (generalized)   3. Unsteadiness on feet   4. Difficulty in walking, not elsewhere classified        Problem List Patient Active Problem List   Diagnosis Date Noted  . Lumbar sprain 03/28/2019  . Dizziness   . Tinnitus, bilateral 11/28/2017  . Abnormal MRI of head 11/14/2017  . Meningioma  ([place]) 09/21/2017  . Ataxia 05/11/2017  . PAF (paroxysmal atrial fibrillation) ([place]) 02/03/2017  . Preventative health care 06/02/2016  . Advance directive discussed with patient 06/02/2016  . Achilles tendonitis 03/11/2016  . Hypertension   . DVT, lower extremity, recurrent ([place])   . Type 2 diabetes, controlled, with peripheral neuropathy ([place])   . Generalized osteoarthritis of multiple sites   . GERD (gastroesophageal reflux disease)   . Hyperthyroidism   . Retinal hole of right eye 06/26/2014  . Amblyopia, right 05/08/2014  . Status post intraocular lens implant 05/08/2014    Dalina Samara 05/14/2019, 4:09 PM  [place] MAIN [facility] SERVICES [address] [place], [place], [address] Phone: [phone]   Fax:  [phone]  Name: Angela Bentley MRN: [id number] Date of Birth: 1941/04/06

## 2019-05-20 ENCOUNTER — Encounter: Payer: Self-pay | Admitting: Physical Therapy

## 2019-05-20 ENCOUNTER — Other Ambulatory Visit: Payer: Self-pay

## 2019-05-20 ENCOUNTER — Ambulatory Visit: Payer: Medicare Other | Admitting: Physical Therapy

## 2019-05-20 DIAGNOSIS — M25561 Pain in right knee: Secondary | ICD-10-CM

## 2019-05-20 DIAGNOSIS — R2681 Unsteadiness on feet: Secondary | ICD-10-CM

## 2019-05-20 DIAGNOSIS — R262 Difficulty in walking, not elsewhere classified: Secondary | ICD-10-CM | POA: Diagnosis not present

## 2019-05-20 DIAGNOSIS — M6281 Muscle weakness (generalized): Secondary | ICD-10-CM | POA: Diagnosis not present

## 2019-05-20 NOTE — Therapy (Signed)
[place] MAIN [facility] SERVICES [address] [place], [place], [address] Phone: [phone]   Fax:  [phone]  Physical Therapy Treatment  Patient Details  Name: Angela Bentley MRN: [id number] Date of Birth: November 21, 1940 Referring Provider (PT): Dr. [name] PCP   Encounter Date: 05/20/2019  PT End of Session - 05/20/19 0901    Visit Number  17    Number of Visits  30    Date for PT Re-Evaluation  07/09/19    Authorization Type  goals last re-assessed 05/14/19    PT Start Time  0845    PT Stop Time  0930    PT Time Calculation (min)  45 min    Equipment Utilized During Treatment  Gait belt    Activity Tolerance  Patient tolerated treatment well;No increased pain    Behavior During Therapy  WFL for tasks assessed/performed       Past Medical History:  Diagnosis Date  . Balance problems   . Brain tumor ([place])    a. Left intraventricular tumor ->stable by 06/2017 MRI. Followed @ [facility].  . Gait disturbance    a. Unsteady on feet/balance difficulty.  . Generalized osteoarthritis of multiple sites   . GERD (gastroesophageal reflux disease)   . History of DVT (deep vein thrombosis) 2016   estrogen and airflights. Rx with xarelto for 3 months  . History of stress test    a. 11/2016 ETT: No ST/T changes.  Performed to assess PAC/PVC burden with activity ->No ectopy noted.  . Hypertension   . Hyperthyroidism    treated briefly in college  . Insomnia   . Obesity   . PAF (paroxysmal atrial fibrillation) ([place])    a. 12/2016 Event Monitor: brief run of PAF-->CHA2DS2VASc = 5--> Xarelto.  . Plantar fasciitis   . Retinal tear of left eye   . Rosacea   . Type 2 diabetes, controlled, with peripheral neuropathy ([place])    a. 11/2016 A1c 7.4.  . Uterine fibroid     Past Surgical History:  Procedure Laterality Date  . CARPAL TUNNEL RELEASE Right    Dr Burney Gauze  . CATARACT EXTRACTION    . RETINAL TEAR REPAIR CRYOTHERAPY     10/09/13, then again 3/15     There were no vitals filed for this visit.  Subjective Assessment - 05/20/19 0859    Subjective  Patient reports increased difficulty sleeping last night with increased soreness in anterior shoulders and then started having increased knee pain last night; Otherwise no discomfort over the weekend; She reports doing a lot of reading over the weekend which could have contributed to soreness;    Pertinent History  78 yo Female continues to have right knee pain; She went to orthopedic and had a repeat cortisone injection for right lateral meniscus tear. She reports injection was successful in that she is not having pain with weight bearing in right knee; She is still having right IT band pain; Pt reports that she should not need surgery at this time; She is still having clicking in both knees. She did get shoe inserts but states that they are not working well. She incrementally increased wear and states that they still bothered her. She reports that she hasn't been walking or exercising due to pain and fatigue; She reports not sleeping well and states that when she doesn't sleep well she is more unsteady and having difficulty with her balance; She is having left ankle cramping as a result of new inserts; She  reports the pain is worse in thigh and knees after wearing inserts for longer period of time. She feels like it throws off her balance and is exacerbating burning pain in legs; She also reports increased back pain;    Currently in Pain?  Yes    Pain Score  4     Pain Location  Knee    Pain Orientation  Right;Medial    Pain Descriptors / Indicators  Aching;Sore    Pain Type  Acute pain    Pain Onset  More than a month ago    Pain Frequency  Intermittent    Aggravating Factors   worse at night when sleeping    Pain Relieving Factors  rest/heat/massage    Effect of Pain on Daily Activities  no change;    Multiple Pain Sites  No         TREATMENT: Patient hooklying: RLE SAQ with hip ER to  challenge VMO activation 3 sec hold x12 with min VCs for proper positioning; RLE SLR hip flexion with hip ER to challenge VMO 2x10 with min VCS for proper positioning for optimal muscle activation; patient fatigues after 10 repetitions;  Green tband around BLE: -hip abduction/ER x15 with good form; -hip flexion march x15 reps each LE with min VCs to increase core stabilization for better back support;   Sidelying: Clamshells green tband 2x15 with RLE only; Patient tolerated well without increase in anterior knee pain;  Standing green tband around RLE: terminal knee extension x15 with min VCs for proper positioning to isolate quad activation; Advanced HEP with terminal knee extension; patient able to don/doff tband independently without difficulty;      Response to treatment: Educated patient in postural exercise to do at night before bed to reduce tightness in pectoralis muscle for better sleeping tolerance; Patient tolerated strengthening exercise fair. She does fatigue with advanced strengthening, particularly with quad strengthening; She denies any increase in pain with advanced exercise. Advanced HEP, see patient instructions;                       PT Education - 05/20/19 0901    Education Details  LE strengthening/balance;    Person(s) Educated  Patient    Methods  Explanation;Verbal cues    Comprehension  Verbalized understanding;Returned demonstration;Verbal cues required;Need further instruction       PT Short Term Goals - 05/14/19 0900      PT SHORT TERM GOAL #1   Title  Patient will be adherent to HEP at least 3x a week to improve functional strength and balance for better safety at home.    Baseline  doing most every day;    Time  4    Period  Weeks    Status  Achieved    Target Date  04/16/19      PT SHORT TERM GOAL #2   Title  Patient will increase RLE knee AROM extension to within 5 degrees of neutral for better standing and walking tolerance;      Time  4    Period  Weeks    Status  Achieved    Target Date  04/16/19      PT SHORT TERM GOAL #3   Title  Patient will reports maximum of 1 sleep disturbances per night over last 3 days to exhibit improved sleeping tolerance with less knee pain and reduced fatigue.     Baseline  7/22: usually wakes up to go to the bathroom and  then unable to go back to sleep and get comfortable; 8/18: has had increased knee soreness which has caused increased  sleep disturbances;    Time  4    Period  Weeks    Status  Not Met    Target Date  04/16/19        PT Long Term Goals - 05/14/19 0905      PT LONG TERM GOAL #1   Title  Patient will increase BLE gross strength to 4+/5 as to improve functional strength for independent gait, increased standing tolerance and increased ADL ability.    Time  8    Period  Weeks    Status  Partially Met    Target Date  07/09/19      PT LONG TERM GOAL #2   Title  Patient will increase lower extremity functional scale to >60/80 to demonstrate improved functional mobility and increased tolerance with ADLs.     Baseline  7/22: 43/80, 05/14/19: 50/80    Time  8    Period  Weeks    Status  Partially Met    Target Date  07/09/19      PT LONG TERM GOAL #3   Title  Patient will report a worst pain of 3/10 on VAS in      right knee       to improve tolerance with ADLs and reduced symptoms with activities.     Baseline  05/14/19: increased ache at night 4/10    Time  8    Period  Weeks    Status  Not Met    Target Date  07/09/19      PT LONG TERM GOAL #4   Title  Patient will complete >1000 feet on 6 min walk test without increase in knee pain to improve community ambulator distance for functional tasks and return to PLOF.    Time  8    Period  Weeks    Status  Achieved    Target Date  07/09/19      PT LONG TERM GOAL #5   Title  Patient will increase Functional Gait Assessment score to >20/30 as to reduce fall risk and improve dynamic gait safety with community  ambulation.    Time  8    Period  Weeks    Status  New    Target Date  07/09/19            Plan - 05/20/19 0906    Clinical Impression Statement  Patient motivated and participated well within session; Instructed patient in advanced LE strengthening focusing on quad control to support knee and reduce discomfort. Patient tolerated well without increase in discomfort. Patient would benefit from additional skilled PT intervention to improve strength, balance and mobility;    Rehab Potential  Good    Clinical Impairments Affecting Rehab Potential  motivated, good PLOF and good caregiver support    PT Frequency  1x / week    PT Duration  8 weeks    PT Treatment/Interventions  Aquatic Therapy;Cryotherapy;Electrical Stimulation;Moist Heat;Ultrasound;Gait training;Stair training;Functional mobility training;Therapeutic activities;Therapeutic exercise;Balance training;Neuromuscular re-education;Patient/family education;Orthotic Fit/Training;Manual techniques;Passive range of motion;Energy conservation;Taping    PT Next Visit Plan  Revisit HEP assure proper performance and no symptoms aggravation     PT Home Exercise Plan  continue as previously given;    Consulted and Agree with Plan of Care  Patient       Patient will benefit from skilled therapeutic intervention in order to improve the  following deficits and impairments:  Abnormal gait, Decreased activity tolerance, Pain, Difficulty walking, Decreased mobility, Decreased balance, Decreased range of motion, Impaired flexibility, Hypomobility, Decreased strength  Visit Diagnosis: Acute pain of right knee  Muscle weakness (generalized)  Unsteadiness on feet  Difficulty in walking, not elsewhere classified     Problem List Patient Active Problem List   Diagnosis Date Noted  . Lumbar sprain 03/28/2019  . Dizziness   . Tinnitus, bilateral 11/28/2017  . Abnormal MRI of head 11/14/2017  . Meningioma ([place]) 09/21/2017  . Ataxia  05/11/2017  . PAF (paroxysmal atrial fibrillation) ([place]) 02/03/2017  . Preventative health care 06/02/2016  . Advance directive discussed with patient 06/02/2016  . Achilles tendonitis 03/11/2016  . Hypertension   . DVT, lower extremity, recurrent ([place])   . Type 2 diabetes, controlled, with peripheral neuropathy ([place])   . Generalized osteoarthritis of multiple sites   . GERD (gastroesophageal reflux disease)   . Hyperthyroidism   . Retinal hole of right eye 06/26/2014  . Amblyopia, right 05/08/2014  . Status post intraocular lens implant 05/08/2014    Effie Janoski PT, DPT 05/20/2019, 12:35 PM  [place] MAIN [facility] SERVICES [address] [place], [place], [address] Phone: [phone]   Fax:  [phone]  Name: Tynetta Bachmann MRN: [id number] Date of Birth: [birth year]

## 2019-05-20 NOTE — Patient Instructions (Signed)
Access Code: [email or web address]  URL: [email or web address]  Date: 05/20/2019  Prepared by: Blanche East   Exercises  Standing Terminal Knee Extension with Resistance - 15 reps - 2 sets - 1x daily - 7x weekly  Standing Backward Shoulder Rolls - 10 reps - 3 sets - 1x daily - 7x weekly  Seated Scapular Retraction - 15 reps - 2 sets - 1x daily - 7x weekly  Doorway Pec Stretch at 60 Elevation - 3 reps - 1 sets - 20 hold - 1x daily - 7x weekly

## 2019-05-28 ENCOUNTER — Encounter: Payer: Self-pay | Admitting: Physical Therapy

## 2019-05-28 ENCOUNTER — Other Ambulatory Visit: Payer: Self-pay

## 2019-05-28 ENCOUNTER — Ambulatory Visit: Payer: Medicare Other | Attending: Obstetrics and Gynecology | Admitting: Physical Therapy

## 2019-05-28 DIAGNOSIS — M25561 Pain in right knee: Secondary | ICD-10-CM

## 2019-05-28 DIAGNOSIS — M6281 Muscle weakness (generalized): Secondary | ICD-10-CM | POA: Diagnosis not present

## 2019-05-28 DIAGNOSIS — R2681 Unsteadiness on feet: Secondary | ICD-10-CM | POA: Diagnosis not present

## 2019-05-28 DIAGNOSIS — R262 Difficulty in walking, not elsewhere classified: Secondary | ICD-10-CM | POA: Diagnosis not present

## 2019-05-28 NOTE — Therapy (Signed)
[place] MAIN [facility] SERVICES [address] [place], [place], [address] Phone: [phone]   Fax:  [phone]  Physical Therapy Treatment  Patient Details  Name: Angela Bentley MRN: [id number] Date of Birth: [birth year] Referring Provider (PT): Dr. [name] PCP   Encounter Date: 05/28/2019  PT End of Session - 05/28/19 0859    Visit Number  18    Number of Visits  30    Date for PT Re-Evaluation  07/09/19    Authorization Type  goals last re-assessed 05/14/19    PT Start Time  0845    PT Stop Time  0930    PT Time Calculation (min)  45 min    Equipment Utilized During Treatment  Gait belt    Activity Tolerance  Patient tolerated treatment well;No increased pain    Behavior During Therapy  WFL for tasks assessed/performed       Past Medical History:  Diagnosis Date  . Balance problems   . Brain tumor ([place])    a. Left intraventricular tumor ->stable by 06/2017 MRI. Followed @ [facility].  . Gait disturbance    a. Unsteady on feet/balance difficulty.  . Generalized osteoarthritis of multiple sites   . GERD (gastroesophageal reflux disease)   . History of DVT (deep vein thrombosis) 2016   estrogen and airflights. Rx with xarelto for 3 months  . History of stress test    a. 11/2016 ETT: No ST/T changes.  Performed to assess PAC/PVC burden with activity ->No ectopy noted.  . Hypertension   . Hyperthyroidism    treated briefly in college  . Insomnia   . Obesity   . PAF (paroxysmal atrial fibrillation) ([place])    a. 12/2016 Event Monitor: brief run of PAF-->CHA2DS2VASc = 5--> Xarelto.  . Plantar fasciitis   . Retinal tear of left eye   . Rosacea   . Type 2 diabetes, controlled, with peripheral neuropathy ([place])    a. 11/2016 A1c 7.4.  . Uterine fibroid     Past Surgical History:  Procedure Laterality Date  . CARPAL TUNNEL RELEASE Right    Dr Burney Gauze  . CATARACT EXTRACTION    . RETINAL TEAR REPAIR CRYOTHERAPY     10/09/13, then again 3/15     There were no vitals filed for this visit.  Subjective Assessment - 05/28/19 0850    Subjective  Patient reports not liking the bike as it is hurting her knee. She reports having some sleepless nights due to increased knee pain; She tried taking the orthotics out of her shoes to see what would happen, and she feels like the orthotics are throwing her balance forward;She reports feeling like she is able to get a better heel strike without the orthotics; She didn't use the bike over the weekend and subsequently had less pain;    Pertinent History  78 yo Female continues to have right knee pain; She went to orthopedic and had a repeat cortisone injection for right lateral meniscus tear. She reports injection was successful in that she is not having pain with weight bearing in right knee; She is still having right IT band pain; Pt reports that she should not need surgery at this time; She is still having clicking in both knees. She did get shoe inserts but states that they are not working well. She incrementally increased wear and states that they still bothered her. She reports that she hasn't been walking or exercising due to pain and fatigue; She reports not sleeping  well and states that when she doesn't sleep well she is more unsteady and having difficulty with her balance; She is having left ankle cramping as a result of new inserts; She reports the pain is worse in thigh and knees after wearing inserts for longer period of time. She feels like it throws off her balance and is exacerbating burning pain in legs; She also reports increased back pain;    Currently in Pain?  Yes    Pain Score  1     Pain Location  Knee    Pain Orientation  Right;Lateral    Pain Descriptors / Indicators  Aching;Sore    Pain Type  Acute pain    Pain Onset  More than a month ago    Pain Frequency  Intermittent    Aggravating Factors   worse at night when sleeping;    Pain Relieving Factors  rest/heat/massage    Effect  of Pain on Daily Activities  no change;    Multiple Pain Sites  No           TREATMENT: Warm up with walking in hallway x200 feet with better heel strike on RLE noted and decreased heel strike on LLE with increased left foot drag;   Standing heel off step 20 sec hold x2 reps bilaterally; Hamstring stretch with ankle DF 20 sec hold x2 reps bilaterally, required min Vcs for proper positioning for optimal stretching;   Leg press: BLE plate 105# 3K12, required min VCs for proper positioning to improve motor control and reduce knee discomfort; Finished with rolling stick along each quadricep muscle to reduce delayed onset muscle soreness x1 min each LE;   Patient instructed in advanced balance exercise  Standing on airex foam: -incline standing with eyes open 30 sec hold to challenge stance control; initially patient exhibits heavy posterior lean, requiring min Vcs to improve forward weight shift with hinging at hips. Educated patient to work towards standing with erect posture without posterior loss of balance;  -side stepping with looking at feet x4 laps, progressing to side step over foam pad with head up x5 laps; Required supervision for safety, no HHA; Patient required min Vcs for proper foot placement for better safety awareness;  -forward step with contralateral SLS 5 sec hold x2 sets each LE; required cues to increase core stabilization and hip activation for better stance control; Patient has increased difficulty with SLS on RLE due to weakness/knee discomfort;  Advanced HEP with foam pad exercise to challenge balance control;   Standing on 1/2 foam: (Flat side up) -heel/toe rocks with feet apart heel/toe rocks x15 with rail assist for safety -feet apart, toes down (decline) eyes open/closed 10 sec hold x2 sets; Toes up (incline) eyes open/closed 10 sec hold x2 sets with heavy posterior lean; Patient required cues for hip strategies to improve stance control;    Response to  treatment: patient presents with improved foot clearance with RLE during gait, however continues to have LLE foot drag. Concerned LLE ankle stiffness could be contributing to foot drag; Advanced HEP with instruction in advanced balance tasks both static and dynamic. Patient exhibits decreased stance control while standing on incline with heavy posterior lean with decreased hip/knee strategies. Educated patient in proper positioning/weight shift for better stance control; Patient able to progress well with improved positioning with better stance control; She reports increased fatigue at end of session but denies any increase in knee pain;  PT Education - 05/28/19 0859    Education Details  LE strengthening/balance;    Person(s) Educated  Patient    Methods  Explanation;Verbal cues    Comprehension  Verbalized understanding;Returned demonstration;Verbal cues required;Need further instruction       PT Short Term Goals - 05/14/19 0900      PT SHORT TERM GOAL #1   Title  Patient will be adherent to HEP at least 3x a week to improve functional strength and balance for better safety at home.    Baseline  doing most every day;    Time  4    Period  Weeks    Status  Achieved    Target Date  04/16/19      PT SHORT TERM GOAL #2   Title  Patient will increase RLE knee AROM extension to within 5 degrees of neutral for better standing and walking tolerance;     Time  4    Period  Weeks    Status  Achieved    Target Date  04/16/19      PT SHORT TERM GOAL #3   Title  Patient will reports maximum of 1 sleep disturbances per night over last 3 days to exhibit improved sleeping tolerance with less knee pain and reduced fatigue.     Baseline  7/22: usually wakes up to go to the bathroom and then unable to go back to sleep and get comfortable; 8/18: has had increased knee soreness which has caused increased  sleep disturbances;    Time  4    Period  Weeks    Status  Not Met     Target Date  04/16/19        PT Long Term Goals - 05/14/19 0905      PT LONG TERM GOAL #1   Title  Patient will increase BLE gross strength to 4+/5 as to improve functional strength for independent gait, increased standing tolerance and increased ADL ability.    Time  8    Period  Weeks    Status  Partially Met    Target Date  07/09/19      PT LONG TERM GOAL #2   Title  Patient will increase lower extremity functional scale to >60/80 to demonstrate improved functional mobility and increased tolerance with ADLs.     Baseline  7/22: 43/80, 05/14/19: 50/80    Time  8    Period  Weeks    Status  Partially Met    Target Date  07/09/19      PT LONG TERM GOAL #3   Title  Patient will report a worst pain of 3/10 on VAS in      right knee       to improve tolerance with ADLs and reduced symptoms with activities.     Baseline  05/14/19: increased ache at night 4/10    Time  8    Period  Weeks    Status  Not Met    Target Date  07/09/19      PT LONG TERM GOAL #4   Title  Patient will complete >1000 feet on 6 min walk test without increase in knee pain to improve community ambulator distance for functional tasks and return to PLOF.    Time  8    Period  Weeks    Status  Achieved    Target Date  07/09/19      PT LONG TERM GOAL #5   Title  Patient  will increase Functional Gait Assessment score to >20/30 as to reduce fall risk and improve dynamic gait safety with community ambulation.    Time  8    Period  Weeks    Status  New    Target Date  07/09/19            Plan - 05/28/19 1211    Clinical Impression Statement  Patient motivated and participated well within session; Advanced HEP with instruction in advanced balance tasks. Patient had increased difficulty with incline standing with decreased hip strategies requiring cues for proper weight shift and positioning for better stance control. She denies any increase in knee pain with advanced exercise. PT will reach out to orthotist  regarding concern of orthotics causing anterior weight shift. Patient would benefit from additional skilled PT Intervention to improve strength, balance and gait safety;    Rehab Potential  Good    Clinical Impairments Affecting Rehab Potential  motivated, good PLOF and good caregiver support    PT Frequency  1x / week    PT Duration  8 weeks    PT Treatment/Interventions  Aquatic Therapy;Cryotherapy;Electrical Stimulation;Moist Heat;Ultrasound;Gait training;Stair training;Functional mobility training;Therapeutic activities;Therapeutic exercise;Balance training;Neuromuscular re-education;Patient/family education;Orthotic Fit/Training;Manual techniques;Passive range of motion;Energy conservation;Taping    PT Next Visit Plan  Revisit HEP assure proper performance and no symptoms aggravation     PT Home Exercise Plan  continue as previously given;    Consulted and Agree with Plan of Care  Patient       Patient will benefit from skilled therapeutic intervention in order to improve the following deficits and impairments:  Abnormal gait, Decreased activity tolerance, Pain, Difficulty walking, Decreased mobility, Decreased balance, Decreased range of motion, Impaired flexibility, Hypomobility, Decreased strength  Visit Diagnosis: Acute pain of right knee  Muscle weakness (generalized)  Unsteadiness on feet  Difficulty in walking, not elsewhere classified     Problem List Patient Active Problem List   Diagnosis Date Noted  . Lumbar sprain 03/28/2019  . Dizziness   . Tinnitus, bilateral 11/28/2017  . Abnormal MRI of head 11/14/2017  . Meningioma ([place]) 09/21/2017  . Ataxia 05/11/2017  . PAF (paroxysmal atrial fibrillation) ([place]) 02/03/2017  . Preventative health care 06/02/2016  . Advance directive discussed with patient 06/02/2016  . Achilles tendonitis 03/11/2016  . Hypertension   . DVT, lower extremity, recurrent ([place])   . Type 2 diabetes, controlled, with peripheral neuropathy  ([place])   . Generalized osteoarthritis of multiple sites   . GERD (gastroesophageal reflux disease)   . Hyperthyroidism   . Retinal hole of right eye 06/26/2014  . Amblyopia, right 05/08/2014  . Status post intraocular lens implant 05/08/2014    Khali Albanese PT, DPT 05/28/2019, 12:12 PM  [place] [address] [place], [place], [address] Phone: [phone]   Fax:  [phone]  Name: Angela Bentley MRN: [id number] Date of Birth: 04-23-1941

## 2019-05-28 NOTE — Patient Instructions (Signed)
Access Code: [email or web address]  URL: [email or web address]  Date: 05/28/2019  Prepared by: Blanche East   Exercises  Side Step Down with Counter Support - 10 reps - 2 sets - 1x daily - 7x weekly  Runner's Step up with Arms Forward - 5 reps - 1 sets - 5 hold - 1x daily - 7x weekly  Single Leg Stance on Incline Stance Board - 3 reps - 1 sets - 30 hold - 1x daily - 7x weekly

## 2019-06-05 ENCOUNTER — Ambulatory Visit: Payer: Medicare Other | Admitting: Physical Therapy

## 2019-06-05 ENCOUNTER — Other Ambulatory Visit: Payer: Self-pay

## 2019-06-05 ENCOUNTER — Encounter: Payer: Self-pay | Admitting: Physical Therapy

## 2019-06-05 DIAGNOSIS — R262 Difficulty in walking, not elsewhere classified: Secondary | ICD-10-CM

## 2019-06-05 DIAGNOSIS — M25561 Pain in right knee: Secondary | ICD-10-CM

## 2019-06-05 DIAGNOSIS — R2681 Unsteadiness on feet: Secondary | ICD-10-CM

## 2019-06-05 DIAGNOSIS — M6281 Muscle weakness (generalized): Secondary | ICD-10-CM | POA: Diagnosis not present

## 2019-06-05 NOTE — Patient Instructions (Signed)
Access Code: [email or web address]  URL: [email or web address]  Date: 06/05/2019  Prepared by: Blanche East   Exercises  Standing March - 15 reps - 2 sets - 1x daily - 7x weekly  Heel Toe Raises with Counter Support - 15 reps - 2 sets - 1x daily - 7x weekly  Supine Straight Leg Raises - 10 reps - 2 sets - 1x daily - 7x weekly

## 2019-06-05 NOTE — Therapy (Signed)
[place] MAIN [facility] SERVICES [address] [place], [place], [address] Phone: [phone]   Fax:  [phone]  Physical Therapy Treatment  Patient Details  Name: Angela Bentley MRN: [id number] Date of Birth: June 25, 1941 Referring Provider (PT): Dr. [name] PCP   Encounter Date: 06/05/2019  PT End of Session - 06/05/19 0815    Visit Number  19    Number of Visits  30    Date for PT Re-Evaluation  07/09/19    Authorization Type  goals last re-assessed 05/14/19    PT Start Time  0802    PT Stop Time  0845    PT Time Calculation (min)  43 min    Equipment Utilized During Treatment  Gait belt    Activity Tolerance  Patient tolerated treatment well;No increased pain    Behavior During Therapy  WFL for tasks assessed/performed       Past Medical History:  Diagnosis Date  . Balance problems   . Brain tumor ([place])    a. Left intraventricular tumor ->stable by 06/2017 MRI. Followed @ [facility].  . Gait disturbance    a. Unsteady on feet/balance difficulty.  . Generalized osteoarthritis of multiple sites   . GERD (gastroesophageal reflux disease)   . History of DVT (deep vein thrombosis) 2016   estrogen and airflights. Rx with xarelto for 3 months  . History of stress test    a. 11/2016 ETT: No ST/T changes.  Performed to assess PAC/PVC burden with activity ->No ectopy noted.  . Hypertension   . Hyperthyroidism    treated briefly in college  . Insomnia   . Obesity   . PAF (paroxysmal atrial fibrillation) ([place])    a. 12/2016 Event Monitor: brief run of PAF-->CHA2DS2VASc = 5--> Xarelto.  . Plantar fasciitis   . Retinal tear of left eye   . Rosacea   . Type 2 diabetes, controlled, with peripheral neuropathy ([place])    a. 11/2016 A1c 7.4.  . Uterine fibroid     Past Surgical History:  Procedure Laterality Date  . CARPAL TUNNEL RELEASE Right    Dr Burney Gauze  . CATARACT EXTRACTION    . RETINAL TEAR REPAIR CRYOTHERAPY     10/09/13, then again 3/15     There were no vitals filed for this visit.  Subjective Assessment - 06/05/19 0804    Subjective  Patient reports trying to pick something up that was too heavy and having increased back pain; She reports not sleeping well last night and having right sided back pain: she reports increased fatigue;    Pertinent History  78 yo Female continues to have right knee pain; She went to orthopedic and had a repeat cortisone injection for right lateral meniscus tear. She reports injection was successful in that she is not having pain with weight bearing in right knee; She is still having right IT band pain; Pt reports that she should not need surgery at this time; She is still having clicking in both knees. She did get shoe inserts but states that they are not working well. She incrementally increased wear and states that they still bothered her. She reports that she hasn't been walking or exercising due to pain and fatigue; She reports not sleeping well and states that when she doesn't sleep well she is more unsteady and having difficulty with her balance; She is having left ankle cramping as a result of new inserts; She reports the pain is worse in thigh and knees after wearing  inserts for longer period of time. She feels like it throws off her balance and is exacerbating burning pain in legs; She also reports increased back pain;    Currently in Pain?  Yes    Pain Score  4     Pain Location  Back    Pain Orientation  Lower;Right    Pain Descriptors / Indicators  Aching;Sore    Pain Type  Acute pain    Pain Onset  Yesterday    Pain Frequency  Intermittent    Aggravating Factors   worse at night    Pain Relieving Factors  rest/heat    Effect of Pain on Daily Activities  better with movement;    Multiple Pain Sites  Yes    Pain Score  1    Pain Location  Knee    Pain Orientation  Right;Left    Pain Descriptors / Indicators  Aching;Sore    Pain Type  Chronic pain    Pain Onset  More than a month ago     Pain Frequency  Intermittent    Aggravating Factors   transfers/    Pain Relieving Factors  rest    Effect of Pain on Daily Activities  decreased activity tolerance;          TREATMENT: Hooklying with moist heat under low back: SAQ 2# x15 bilaterally; instructed patient to rotate foot out into ER for better VMO activation;  SLR hip flexion 2# x10 bilaterally with min VCs for proper positioning and to slow down LE movement for better strengthening;   Single knee to chest stretch 20 sec hold x 2 reps, (1 rep prior to manual therapy with increased knee pain reported, 1 rep following manual therapy with no knee pain reported with improved ROM tolerance)  Standing heel off step 20 sec hold x2 reps bilaterally;  Manual therapy: PT identified increased tightness in right quadriceps muscle, particularly superior patella. PT performed soft tissue massage, utilizing edge tool for IASTM x10 min; patient tolerated well reporting less tenderness and being able to bend right knee without discomfort or pain following manual therapy;               Patient instructed in advanced balance exercise  Standing on airex foam: -incline standing with eyes open/closed 10 sec hold to challenge stance control; Patient exhibits good stance control without loss of balance; -feet together, eyes open/closed 10 sec hold unsupported with minimal sway noted; Patient reports adherence with HEP working on balance exercise on foam pad; Advanced HEP with foam pad exercise to challenge balance control instructing patient in dynamic exercise: -heel/toe raises unsupported x10 reps CGA to close supervision with cues for weight shift for better stance control; -alternate march x10 reps bilaterally unsupported, close supervision with cues to slow down LE movement for better SLS challenge and dynamic balance control. Patient had more difficulty slowing down march as compared to quick march with decreased weight shift noted;   Provided written HEP for better adherence;   Response to treatment: Patient tolerated well. She reports less knee discomfort and better flexibility following manual therapy; Patient exhibits improved static stance control on foam pad. Progressed balance exercise with dynamic balance tasks to challenge balance with HEP; Patient verbalized and demonstrated understanding; Educated patient in proper positioning to help relieve back/knee discomfort;                      PT Education - 06/05/19 0815    Education Details  LE strengthening, balance; HEP    Person(s) Educated  Patient    Methods  Explanation    Comprehension  Verbalized understanding;Verbal cues required       PT Short Term Goals - 05/14/19 0900      PT SHORT TERM GOAL #1   Title  Patient will be adherent to HEP at least 3x a week to improve functional strength and balance for better safety at home.    Baseline  doing most every day;    Time  4    Period  Weeks    Status  Achieved    Target Date  04/16/19      PT SHORT TERM GOAL #2   Title  Patient will increase RLE knee AROM extension to within 5 degrees of neutral for better standing and walking tolerance;     Time  4    Period  Weeks    Status  Achieved    Target Date  04/16/19      PT SHORT TERM GOAL #3   Title  Patient will reports maximum of 1 sleep disturbances per night over last 3 days to exhibit improved sleeping tolerance with less knee pain and reduced fatigue.     Baseline  7/22: usually wakes up to go to the bathroom and then unable to go back to sleep and get comfortable; 8/18: has had increased knee soreness which has caused increased  sleep disturbances;    Time  4    Period  Weeks    Status  Not Met    Target Date  04/16/19        PT Long Term Goals - 05/14/19 0905      PT LONG TERM GOAL #1   Title  Patient will increase BLE gross strength to 4+/5 as to improve functional strength for independent gait, increased standing  tolerance and increased ADL ability.    Time  8    Period  Weeks    Status  Partially Met    Target Date  07/09/19      PT LONG TERM GOAL #2   Title  Patient will increase lower extremity functional scale to >60/80 to demonstrate improved functional mobility and increased tolerance with ADLs.     Baseline  7/22: 43/80, 05/14/19: 50/80    Time  8    Period  Weeks    Status  Partially Met    Target Date  07/09/19      PT LONG TERM GOAL #3   Title  Patient will report a worst pain of 3/10 on VAS in      right knee       to improve tolerance with ADLs and reduced symptoms with activities.     Baseline  05/14/19: increased ache at night 4/10    Time  8    Period  Weeks    Status  Not Met    Target Date  07/09/19      PT LONG TERM GOAL #4   Title  Patient will complete >1000 feet on 6 min walk test without increase in knee pain to improve community ambulator distance for functional tasks and return to PLOF.    Time  8    Period  Weeks    Status  Achieved    Target Date  07/09/19      PT LONG TERM GOAL #5   Title  Patient will increase Functional Gait Assessment score to >20/30 as to reduce fall risk  and improve dynamic gait safety with community ambulation.    Time  8    Period  Weeks    Status  New    Target Date  07/09/19            Plan - 06/05/19 0854    Clinical Impression Statement  Patient reports increased soreness this session related to trying to pick up heavy object. Patient instructed in supine exercise at start of session with moist heat to low back for better comfort and tolerance. PT performed soft tissue massage to right quadriceps this session to reduce tightness/soreness. Patient tolerated well reporting significant less pain. Patient instructed in advanced balance exercise as part of HEP. She is progressing in static standing balance with less unsteadiness this session. progressed balance exercise to dynamice movement on compliant surfaces with added difficulty.  She would benefit from additional skilled PT intervention to improve strength, balance and gait safety;    Rehab Potential  Good    Clinical Impairments Affecting Rehab Potential  motivated, good PLOF and good caregiver support    PT Frequency  1x / week    PT Duration  8 weeks    PT Treatment/Interventions  Aquatic Therapy;Cryotherapy;Electrical Stimulation;Moist Heat;Ultrasound;Gait training;Stair training;Functional mobility training;Therapeutic activities;Therapeutic exercise;Balance training;Neuromuscular re-education;Patient/family education;Orthotic Fit/Training;Manual techniques;Passive range of motion;Energy conservation;Taping    PT Next Visit Plan  Revisit HEP assure proper performance and no symptoms aggravation     PT Home Exercise Plan  continue as previously given;    Consulted and Agree with Plan of Care  Patient       Patient will benefit from skilled therapeutic intervention in order to improve the following deficits and impairments:  Abnormal gait, Decreased activity tolerance, Pain, Difficulty walking, Decreased mobility, Decreased balance, Decreased range of motion, Impaired flexibility, Hypomobility, Decreased strength  Visit Diagnosis: Acute pain of right knee  Muscle weakness (generalized)  Unsteadiness on feet  Difficulty in walking, not elsewhere classified     Problem List Patient Active Problem List   Diagnosis Date Noted  . Lumbar sprain 03/28/2019  . Dizziness   . Tinnitus, bilateral 11/28/2017  . Abnormal MRI of head 11/14/2017  . Meningioma ([place]) 09/21/2017  . Ataxia 05/11/2017  . PAF (paroxysmal atrial fibrillation) ([place]) 02/03/2017  . Preventative health care 06/02/2016  . Advance directive discussed with patient 06/02/2016  . Achilles tendonitis 03/11/2016  . Hypertension   . DVT, lower extremity, recurrent ([place])   . Type 2 diabetes, controlled, with peripheral neuropathy ([place])   . Generalized osteoarthritis of multiple sites   . GERD  (gastroesophageal reflux disease)   . Hyperthyroidism   . Retinal hole of right eye 06/26/2014  . Amblyopia, right 05/08/2014  . Status post intraocular lens implant 05/08/2014    Nolton Denis PT, DPT 06/05/2019, 8:58 AM  [place] MAIN [facility] SERVICES [address] [place], [place], [address] Phone: [phone]   Fax:  [phone]  Name: Angela Bentley MRN: [id number] Date of Birth: [birth year]

## 2019-06-12 ENCOUNTER — Ambulatory Visit: Payer: Medicare Other | Admitting: Physical Therapy

## 2019-06-18 ENCOUNTER — Ambulatory Visit: Payer: Medicare Other | Admitting: Physical Therapy

## 2019-06-18 ENCOUNTER — Other Ambulatory Visit: Payer: Self-pay

## 2019-06-18 ENCOUNTER — Encounter: Payer: Self-pay | Admitting: Physical Therapy

## 2019-06-18 DIAGNOSIS — M6281 Muscle weakness (generalized): Secondary | ICD-10-CM

## 2019-06-18 DIAGNOSIS — R2681 Unsteadiness on feet: Secondary | ICD-10-CM

## 2019-06-18 DIAGNOSIS — R262 Difficulty in walking, not elsewhere classified: Secondary | ICD-10-CM | POA: Diagnosis not present

## 2019-06-18 DIAGNOSIS — M25561 Pain in right knee: Secondary | ICD-10-CM

## 2019-06-18 NOTE — Therapy (Signed)
[place] MAIN [facility] SERVICES [address] [place], [place], [address] Phone: [phone]   Fax:  [phone]  Physical Therapy Treatment Physical Therapy Progress Note   Dates of reporting period  05/14/19   to  06/18/19  Patient Details  Name: Angela Bentley MRN: [id number] Date of Birth: 1941/01/02 Referring Provider (PT): Dr. [name] PCP   Encounter Date: 06/18/2019  PT End of Session - 06/18/19 0856    Visit Number  20    Number of Visits  30    Date for PT Re-Evaluation  07/09/19    Authorization Type  goals last re-assessed 06/18/19    PT Start Time  0850    PT Stop Time  0930    PT Time Calculation (min)  40 min    Equipment Utilized During Treatment  Gait belt    Activity Tolerance  Patient tolerated treatment well;No increased pain    Behavior During Therapy  WFL for tasks assessed/performed       Past Medical History:  Diagnosis Date  . Balance problems   . Brain tumor ([place])    a. Left intraventricular tumor ->stable by 06/2017 MRI. Followed @ [facility].  . Gait disturbance    a. Unsteady on feet/balance difficulty.  . Generalized osteoarthritis of multiple sites   . GERD (gastroesophageal reflux disease)   . History of DVT (deep vein thrombosis) 2016   estrogen and airflights. Rx with xarelto for 3 months  . History of stress test    a. 11/2016 ETT: No ST/T changes.  Performed to assess PAC/PVC burden with activity ->No ectopy noted.  . Hypertension   . Hyperthyroidism    treated briefly in college  . Insomnia   . Obesity   . PAF (paroxysmal atrial fibrillation) ([place])    a. 12/2016 Event Monitor: brief run of PAF-->CHA2DS2VASc = 5--> Xarelto.  . Plantar fasciitis   . Retinal tear of left eye   . Rosacea   . Type 2 diabetes, controlled, with peripheral neuropathy ([place])    a. 11/2016 A1c 7.4.  . Uterine fibroid     Past Surgical History:  Procedure Laterality Date  . CARPAL TUNNEL RELEASE Right    Dr Burney Gauze  .  CATARACT EXTRACTION    . RETINAL TEAR REPAIR CRYOTHERAPY     10/09/13, then again 3/15    There were no vitals filed for this visit.  Subjective Assessment - 06/18/19 0851    Subjective  Patient reports being busy with her family; She reports continued intermittent right knee pain; She reports waking up in the night with increased medial knee pain; She has [place] is a 3 story condo and had to go up/down stairs multiple times;    Pertinent History  78 yo Female continues to have right knee pain; She went to orthopedic and had a repeat cortisone injection for right lateral meniscus tear. She reports injection was successful in that she is not having pain with weight bearing in right knee; She is still having right IT band pain; Pt reports that she should not need surgery at this time; She is still having clicking in both knees. She did get shoe inserts but states that they are not working well. She incrementally increased wear and states that they still bothered her. She reports that she hasn't been walking or exercising due to pain and fatigue; She reports not sleeping well and states that when she doesn't sleep well she is  more unsteady and having difficulty with her balance; She is having left ankle cramping as a result of new inserts; She reports the pain is worse in thigh and knees after wearing inserts for longer period of time. She feels like it throws off her balance and is exacerbating burning pain in legs; She also reports increased back pain;    Currently in Pain?  Yes    Pain Score  3     Pain Location  Knee    Pain Orientation  Right;Medial    Pain Descriptors / Indicators  Aching;Sore    Pain Type  Chronic pain    Pain Onset  More than a month ago    Pain Frequency  Intermittent    Aggravating Factors   worse at night with multiple sleep disturbances;    Pain Relieving Factors  rest/heat    Effect of Pain on Daily Activities  better with movement;     Multiple Pain Sites  No    Pain Onset  More than a month ago         [facility] PT Assessment - 06/18/19 0001      Observation/Other Assessments   Lower Extremity Functional Scale   47/80 the lower the score the greater the disability, slightly more impaired from 05/14/19 which was 50/80      6 minute walk test results    Aerobic Endurance Distance Walked  1200    Endurance additional comments  with increased shortness of breath, does exhibit increased foot drag on LLE, no increase in knee pain; Patient had 2-3 episodes of imbalance with noticeable stumble forward; able to catch self;       Functional Gait  Assessment   Gait Level Surface  Walks 20 ft in less than 5.5 sec, no assistive devices, good speed, no evidence for imbalance, normal gait pattern, deviates no more than 6 in outside of the 12 in walkway width.    Change in Gait Speed  Able to smoothly change walking speed without loss of balance or gait deviation. Deviate no more than 6 in outside of the 12 in walkway width.    Gait with Horizontal Head Turns  Performs head turns smoothly with slight change in gait velocity (eg, minor disruption to smooth gait path), deviates 6-10 in outside 12 in walkway width, or uses an assistive device.    Gait with Vertical Head Turns  Performs head turns with no change in gait. Deviates no more than 6 in outside 12 in walkway width.    Gait and Pivot Turn  Pivot turns safely within 3 sec and stops quickly with no loss of balance.    Step Over Obstacle  Is able to step over one shoe box (4.5 in total height) without changing gait speed. No evidence of imbalance.    Gait with Narrow Base of Support  Ambulates less than 4 steps heel to toe or cannot perform without assistance.    Gait with Eyes Closed  Walks 20 ft, slow speed, abnormal gait pattern, evidence for imbalance, deviates 10-15 in outside 12 in walkway width. Requires more than 9 sec to ambulate 20 ft.    Ambulating Backwards  Walks 20 ft, uses  assistive device, slower speed, mild gait deviations, deviates 6-10 in outside 12 in walkway width.    Steps  Alternating feet, must use rail.    Total Score  21         TREATMENT: Instructed patient in 6 min walk, FGA and  other outcomes to address goals;  Patient requires min VCs for proper activity;  When walking 6 min walk she exhibits multiple stumbles with increased LLE foot drag. She was able to catch herself without falling but was unsteady requiring close supervision;  Patient does complain of increased RLE knee pain when descending stairs, exhibiting slight valgus positioning which could be related to hip abductor weakness and poor knee control;   Response to treatment: Patient tolerated fair. She continues to have dynamic instability requiring close supervision and cues for proper activity technique. Patient's condition has the potential to improve in response to therapy. Maximum improvement is yet to be obtained. The anticipated improvement is attainable and reasonable in a generally predictable time.  Patient reports adherence with HEP; She denies any recent falls.                    PT Education - 06/18/19 0856    Education Details  LE strengthening, balance; HEP    Person(s) Educated  Patient    Methods  Explanation;Verbal cues    Comprehension  Verbalized understanding;Returned demonstration;Verbal cues required;Need further instruction       PT Short Term Goals - 06/18/19 0857      PT SHORT TERM GOAL #1   Title  Patient will be adherent to HEP at least 3x a week to improve functional strength and balance for better safety at home.    Baseline  doing most every day;    Time  4    Period  Weeks    Status  Achieved    Target Date  04/16/19      PT SHORT TERM GOAL #2   Title  Patient will increase RLE knee AROM extension to within 5 degrees of neutral for better standing and walking tolerance;     Time  4    Period  Weeks    Status  Achieved     Target Date  04/16/19      PT SHORT TERM GOAL #3   Title  Patient will reports maximum of 1 sleep disturbances per night over last 3 days to exhibit improved sleeping tolerance with less knee pain and reduced fatigue.     Baseline  7/22: usually wakes up to go to the bathroom and then unable to go back to sleep and get comfortable; 8/18: has had increased knee soreness which has caused increased  sleep disturbances; 06/18/19: continues to have sleep disturbances;    Time  4    Period  Weeks    Status  Not Met    Target Date  07/16/19        PT Long Term Goals - 06/18/19 4492      PT LONG TERM GOAL #1   Title  Patient will increase BLE gross strength to 4+/5 as to improve functional strength for independent gait, increased standing tolerance and increased ADL ability.    Time  8    Period  Weeks    Status  Partially Met    Target Date  08/13/19      PT LONG TERM GOAL #2   Title  Patient will increase lower extremity functional scale to >60/80 to demonstrate improved functional mobility and increased tolerance with ADLs.     Baseline  7/22: 43/80, 05/14/19: 50/80    Time  8    Period  Weeks    Status  Partially Met      PT LONG TERM GOAL #3   Title  Patient will report a worst pain of 3/10 on VAS in      right knee       to improve tolerance with ADLs and reduced symptoms with activities.     Baseline  05/14/19: increased ache at night 4/10    Time  8    Period  Weeks    Status  Not Met    Target Date  08/13/19      PT LONG TERM GOAL #4   Title  Patient will complete >1000 feet on 6 min walk test without increase in knee pain to improve community ambulator distance for functional tasks and return to PLOF.    Time  8    Period  Weeks    Status  Achieved    Target Date  08/13/19      PT LONG TERM GOAL #5   Title  Patient will increase Functional Gait Assessment score to >22/30 as to reduce fall risk and improve dynamic gait safety with community ambulation.    Time  8     Period  Weeks    Status  Revised    Target Date  08/13/19            Plan - 06/18/19 0949    Clinical Impression Statement  Patient has missed several appointments with going out of town with her family. She reports adherence with HEP; However she does report increased fatigue with not sleeping as well from travelling and from increased knee pain; Patient presents to therapy with increased knee pain which she thinks is a result of having to negotiate increased stairs at the [facility]. She exhibited increased imbalance today and tested as a high fall risk. Patient would benefit from additional skilled PT Intervention to improve strength, balance and gait safety; Plan to continue working with patient 1x a week to address HEP and improve dynamic stability while reducing pain;    Rehab Potential  Good    Clinical Impairments Affecting Rehab Potential  motivated, good PLOF and good caregiver support    PT Frequency  1x / week    PT Duration  8 weeks    PT Treatment/Interventions  Aquatic Therapy;Cryotherapy;Electrical Stimulation;Moist Heat;Ultrasound;Gait training;Stair training;Functional mobility training;Therapeutic activities;Therapeutic exercise;Balance training;Neuromuscular re-education;Patient/family education;Orthotic Fit/Training;Manual techniques;Passive range of motion;Energy conservation;Taping    PT Next Visit Plan  Revisit HEP assure proper performance and no symptoms aggravation     PT Home Exercise Plan  continue as previously given;    Consulted and Agree with Plan of Care  Patient       Patient will benefit from skilled therapeutic intervention in order to improve the following deficits and impairments:  Abnormal gait, Decreased activity tolerance, Pain, Difficulty walking, Decreased mobility, Decreased balance, Decreased range of motion, Impaired flexibility, Hypomobility, Decreased strength  Visit Diagnosis: Acute pain of right knee  Muscle weakness  (generalized)  Unsteadiness on feet  Difficulty in walking, not elsewhere classified     Problem List Patient Active Problem List   Diagnosis Date Noted  . Lumbar sprain 03/28/2019  . Dizziness   . Tinnitus, bilateral 11/28/2017  . Abnormal MRI of head 11/14/2017  . Meningioma ([place]) 09/21/2017  . Ataxia 05/11/2017  . PAF (paroxysmal atrial fibrillation) ([place]) 02/03/2017  . Preventative health care 06/02/2016  . Advance directive discussed with patient 06/02/2016  . Achilles tendonitis 03/11/2016  . Hypertension   . DVT, lower extremity, recurrent ([place])   . Type 2 diabetes, controlled, with peripheral neuropathy ([place])   .  Generalized osteoarthritis of multiple sites   . GERD (gastroesophageal reflux disease)   . Hyperthyroidism   . Retinal hole of right eye 06/26/2014  . Amblyopia, right 05/08/2014  . Status post intraocular lens implant 05/08/2014    Gregorio Worley PT, DPT 06/18/2019, 9:53 AM  [place] MAIN [facility] SERVICES [address] [place], [place], [address] Phone: [phone]   Fax:  [phone]  Name: Angela Bentley MRN: [id number] Date of Birth: 1941/08/31

## 2019-06-25 ENCOUNTER — Ambulatory Visit: Payer: Medicare Other | Admitting: Physical Therapy

## 2019-06-25 ENCOUNTER — Encounter: Payer: Self-pay | Admitting: Physical Therapy

## 2019-06-25 ENCOUNTER — Other Ambulatory Visit: Payer: Self-pay

## 2019-06-25 DIAGNOSIS — M6281 Muscle weakness (generalized): Secondary | ICD-10-CM

## 2019-06-25 DIAGNOSIS — R2681 Unsteadiness on feet: Secondary | ICD-10-CM

## 2019-06-25 DIAGNOSIS — M25561 Pain in right knee: Secondary | ICD-10-CM | POA: Diagnosis not present

## 2019-06-25 DIAGNOSIS — R262 Difficulty in walking, not elsewhere classified: Secondary | ICD-10-CM | POA: Diagnosis not present

## 2019-06-25 NOTE — Patient Instructions (Signed)
In the long hallway: Side step up/down 1 lap each; Walk heel to toe forward to end of hallway and then try walking backwards, do 2-3 laps;   Try standing on one foot and kicking other foot in forward diagonal, side and then back diagonal. You can do this with or without a band.

## 2019-06-25 NOTE — Therapy (Signed)
[place] MAIN [facility] SERVICES [address] [place], [place], [address] Phone: [phone]   Fax:  [phone]  Physical Therapy Treatment  Patient Details  Name: Angela Bentley MRN: [id number] Date of Birth: Dec 16, 1940 Referring Provider (PT): Dr. [name] PCP   Encounter Date: 06/25/2019  PT End of Session - 06/25/19 1025    Visit Number  21    Number of Visits  30    Date for PT Re-Evaluation  07/09/19    Authorization Type  goals last re-assessed 06/18/19    PT Start Time  1020    PT Stop Time  1100    PT Time Calculation (min)  40 min    Equipment Utilized During Treatment  Gait belt    Activity Tolerance  Patient tolerated treatment well;No increased pain    Behavior During Therapy  WFL for tasks assessed/performed       Past Medical History:  Diagnosis Date  . Balance problems   . Brain tumor ([place])    a. Left intraventricular tumor ->stable by 06/2017 MRI. Followed @ [facility].  . Gait disturbance    a. Unsteady on feet/balance difficulty.  . Generalized osteoarthritis of multiple sites   . GERD (gastroesophageal reflux disease)   . History of DVT (deep vein thrombosis) 2016   estrogen and airflights. Rx with xarelto for 3 months  . History of stress test    a. 11/2016 ETT: No ST/T changes.  Performed to assess PAC/PVC burden with activity ->No ectopy noted.  . Hypertension   . Hyperthyroidism    treated briefly in college  . Insomnia   . Obesity   . PAF (paroxysmal atrial fibrillation) ([place])    a. 12/2016 Event Monitor: brief run of PAF-->CHA2DS2VASc = 5--> Xarelto.  . Plantar fasciitis   . Retinal tear of left eye   . Rosacea   . Type 2 diabetes, controlled, with peripheral neuropathy ([place])    a. 11/2016 A1c 7.4.  . Uterine fibroid     Past Surgical History:  Procedure Laterality Date  . CARPAL TUNNEL RELEASE Right    Dr Burney Gauze  . CATARACT EXTRACTION    . RETINAL TEAR REPAIR CRYOTHERAPY     10/09/13, then again 3/15     There were no vitals filed for this visit.  Subjective Assessment - 06/25/19 1022    Subjective  Patient reports having increased neck discomfort with "crick in neck". She reports 2 sleepless nights with decreased balance as a result; She tried some heat but it hasn't fully resolved; She reports hearing lots of clicking in right knee;    Pertinent History  78 yo Female continues to have right knee pain; She went to orthopedic and had a repeat cortisone injection for right lateral meniscus tear. She reports injection was successful in that she is not having pain with weight bearing in right knee; She is still having right IT band pain; Pt reports that she should not need surgery at this time; She is still having clicking in both knees. She did get shoe inserts but states that they are not working well. She incrementally increased wear and states that they still bothered her. She reports that she hasn't been walking or exercising due to pain and fatigue; She reports not sleeping well and states that when she doesn't sleep well she is more unsteady and having difficulty with her balance; She is having left ankle cramping as a result of new inserts; She reports the pain is worse  in thigh and knees after wearing inserts for longer period of time. She feels like it throws off her balance and is exacerbating burning pain in legs; She also reports increased back pain;    Currently in Pain?  Yes    Pain Score  2     Pain Location  Neck    Pain Orientation  Left    Pain Descriptors / Indicators  Aching;Sore;Tightness    Pain Type  Acute pain    Pain Onset  In the past 7 days    Pain Frequency  Intermittent    Aggravating Factors   worse at night    Pain Relieving Factors  heat/changing position    Effect of Pain on Daily Activities  better with movement;    Pain Score  2    Pain Location  Knee    Pain Orientation  Right    Pain Descriptors / Indicators  Aching;Sore    Pain Type  Chronic pain    Pain  Onset  More than a month ago    Pain Frequency  Intermittent    Aggravating Factors   standing/weight bearing    Pain Relieving Factors  rest    Effect of Pain on Daily Activities  decreased activity tolerance;          TREATMENT: Standing calf heel off step stretch 20 sec hold x2 reps bilaterally;   Patient instructed in advanced balance exercise  Standing on airex foam with narrow base of support: -Heel/toe raises x15 reps unsupported to challenge stance control; - feet together eyes open 20 sec hold, eyes closed 10 sec hold x2 sets each with CGA to close supervision, minimal sway noted; -standing heels on floor, toes on airex pad (incline) eyes open/closed 10 sec hold x3 sets;   -semi-tandem stance unsupported:  -head turns side/side x5 reps each foot in front; -eyes open/closed 10 sec hold x2 reps each foot in front;  Patient required min VCs for balance stability, including to increase trunk control for less loss of balance with smaller base of support   Stepping over orange hurdle unsupported in open gym: Forward/backward x10 reps  Side step x10 reps each direction Required min VCs to increase hip flexion and increase step length for better foot clearance; also cued patient to avoid looking down at floor to challenge balance;  She had increased difficulty stepping backwards exhibiting multiple stepping strategies to catch self with initial step backwards but then was able to improve with increased repetition;   Exercise: Leg press 115# x15, 130# x15 with BLE with min VCs to slow down LE movement for better motor control and strengthening;  Standing at star: Hip abduction kicks in various directions, unsupported x5 sets each LE; Requires min A for balance and cues to improve weight shift and increase AROM for better hip strengthening;                 PT Education - 06/25/19 1024    Education Details  balance/strengthening, HEP    Person(s) Educated  Patient     Methods  Explanation;Verbal cues    Comprehension  Verbalized understanding;Returned demonstration;Verbal cues required;Need further instruction       PT Short Term Goals - 06/18/19 0857      PT SHORT TERM GOAL #1   Title  Patient will be adherent to HEP at least 3x a week to improve functional strength and balance for better safety at home.    Baseline  doing most every  day;    Time  4    Period  Weeks    Status  Achieved    Target Date  04/16/19      PT SHORT TERM GOAL #2   Title  Patient will increase RLE knee AROM extension to within 5 degrees of neutral for better standing and walking tolerance;     Time  4    Period  Weeks    Status  Achieved    Target Date  04/16/19      PT SHORT TERM GOAL #3   Title  Patient will reports maximum of 1 sleep disturbances per night over last 3 days to exhibit improved sleeping tolerance with less knee pain and reduced fatigue.     Baseline  7/22: usually wakes up to go to the bathroom and then unable to go back to sleep and get comfortable; 8/18: has had increased knee soreness which has caused increased  sleep disturbances; 06/18/19: continues to have sleep disturbances;    Time  4    Period  Weeks    Status  Not Met    Target Date  07/16/19        PT Long Term Goals - 06/18/19 3149      PT LONG TERM GOAL #1   Title  Patient will increase BLE gross strength to 4+/5 as to improve functional strength for independent gait, increased standing tolerance and increased ADL ability.    Time  8    Period  Weeks    Status  Partially Met    Target Date  08/13/19      PT LONG TERM GOAL #2   Title  Patient will increase lower extremity functional scale to >60/80 to demonstrate improved functional mobility and increased tolerance with ADLs.     Baseline  7/22: 43/80, 05/14/19: 50/80    Time  8    Period  Weeks    Status  Partially Met    Target Date  08/13/19      PT LONG TERM GOAL #3   Title  Patient will report a worst pain of 3/10 on  VAS in      right knee       to improve tolerance with ADLs and reduced symptoms with activities.     Baseline  05/14/19: increased ache at night 4/10    Time  8    Period  Weeks    Status  Not Met    Target Date  08/13/19      PT LONG TERM GOAL #4   Title  Patient will complete >1000 feet on 6 min walk test without increase in knee pain to improve community ambulator distance for functional tasks and return to PLOF.    Time  8    Period  Weeks    Status  Achieved    Target Date  08/13/19      PT LONG TERM GOAL #5   Title  Patient will increase Functional Gait Assessment score to >23/30 as to reduce fall risk and improve dynamic gait safety with community ambulation.    Time  8    Period  Weeks    Status  Revised    Target Date  08/13/19            Plan - 06/25/19 1203    Clinical Impression Statement  Pt exhibits improved static balance this session being able to hold position on airex pad with eyes open/closed with less instability; Instructed  patient in dynamic balance tasks with stepping over obstacle unsupported with increased difficulty noted without being able to look down at feet. Patient did reports slight increase in RLE knee pain with SLS tasks kand with increased weight bearing on RLE. She would benefit from skilled PT intervention to improve balance and gait safety;    Rehab Potential  Good    Clinical Impairments Affecting Rehab Potential  motivated, good PLOF and good caregiver support    PT Frequency  1x / week    PT Duration  8 weeks    PT Treatment/Interventions  Aquatic Therapy;Cryotherapy;Electrical Stimulation;Moist Heat;Ultrasound;Gait training;Stair training;Functional mobility training;Therapeutic activities;Therapeutic exercise;Balance training;Neuromuscular re-education;Patient/family education;Orthotic Fit/Training;Manual techniques;Passive range of motion;Energy conservation;Taping    PT Next Visit Plan  Revisit HEP assure proper performance and no  symptoms aggravation     PT Home Exercise Plan  continue as previously given;    Consulted and Agree with Plan of Care  Patient       Patient will benefit from skilled therapeutic intervention in order to improve the following deficits and impairments:  Abnormal gait, Decreased activity tolerance, Pain, Difficulty walking, Decreased mobility, Decreased balance, Decreased range of motion, Impaired flexibility, Hypomobility, Decreased strength  Visit Diagnosis: Acute pain of right knee  Muscle weakness (generalized)  Unsteadiness on feet  Difficulty in walking, not elsewhere classified     Problem List Patient Active Problem List   Diagnosis Date Noted  . Lumbar sprain 03/28/2019  . Dizziness   . Tinnitus, bilateral 11/28/2017  . Abnormal MRI of head 11/14/2017  . Meningioma ([place]) 09/21/2017  . Ataxia 05/11/2017  . PAF (paroxysmal atrial fibrillation) ([place]) 02/03/2017  . Preventative health care 06/02/2016  . Advance directive discussed with patient 06/02/2016  . Achilles tendonitis 03/11/2016  . Hypertension   . DVT, lower extremity, recurrent ([place])   . Type 2 diabetes, controlled, with peripheral neuropathy ([place])   . Generalized osteoarthritis of multiple sites   . GERD (gastroesophageal reflux disease)   . Hyperthyroidism   . Retinal hole of right eye 06/26/2014  . Amblyopia, right 05/08/2014  . Status post intraocular lens implant 05/08/2014    Keeghan Mcintire PT, DPT 06/25/2019, 12:16 PM  [place] MAIN [facility] SERVICES [address] [place], [place], [address] Phone: [phone]   Fax:  [phone]  Name: Angela Bentley MRN: [id number] Date of Birth: [birth year]

## 2019-07-02 DIAGNOSIS — Z23 Encounter for immunization: Secondary | ICD-10-CM | POA: Diagnosis not present

## 2019-07-03 ENCOUNTER — Ambulatory Visit: Payer: Medicare Other | Admitting: Physical Therapy

## 2019-07-10 ENCOUNTER — Other Ambulatory Visit: Payer: Self-pay

## 2019-07-10 ENCOUNTER — Ambulatory Visit: Payer: Medicare Other | Attending: Obstetrics and Gynecology

## 2019-07-10 DIAGNOSIS — M25561 Pain in right knee: Secondary | ICD-10-CM

## 2019-07-10 DIAGNOSIS — R262 Difficulty in walking, not elsewhere classified: Secondary | ICD-10-CM | POA: Insufficient documentation

## 2019-07-10 DIAGNOSIS — R2681 Unsteadiness on feet: Secondary | ICD-10-CM | POA: Diagnosis not present

## 2019-07-10 DIAGNOSIS — M6281 Muscle weakness (generalized): Secondary | ICD-10-CM | POA: Diagnosis not present

## 2019-07-10 NOTE — Therapy (Signed)
[place] MAIN [facility] SERVICES [address] [place], [place], [address] Phone: [phone]   Fax:  [phone]  Physical Therapy Treatment  Patient Details  Name: Angela Bentley MRN: [id number] Date of Birth: 1941/07/03 Referring Provider (PT): Dr. [name] PCP   Encounter Date: 07/10/2019  PT End of Session - 07/10/19 0939    Visit Number  22    Number of Visits  30    Date for PT Re-Evaluation  07/09/19    PT Start Time  0933    PT Stop Time  1013    PT Time Calculation (min)  40 min    Equipment Utilized During Treatment  Gait belt    Activity Tolerance  Patient tolerated treatment well;No increased pain    Behavior During Therapy  WFL for tasks assessed/performed       Past Medical History:  Diagnosis Date  . Balance problems   . Brain tumor ([place])    a. Left intraventricular tumor ->stable by 06/2017 MRI. Followed @ [facility].  . Gait disturbance    a. Unsteady on feet/balance difficulty.  . Generalized osteoarthritis of multiple sites   . GERD (gastroesophageal reflux disease)   . History of DVT (deep vein thrombosis) 2016   estrogen and airflights. Rx with xarelto for 3 months  . History of stress test    a. 11/2016 ETT: No ST/T changes.  Performed to assess PAC/PVC burden with activity ->No ectopy noted.  . Hypertension   . Hyperthyroidism    treated briefly in college  . Insomnia   . Obesity   . PAF (paroxysmal atrial fibrillation) ([place])    a. 12/2016 Event Monitor: brief run of PAF-->CHA2DS2VASc = 5--> Xarelto.  . Plantar fasciitis   . Retinal tear of left eye   . Rosacea   . Type 2 diabetes, controlled, with peripheral neuropathy ([place])    a. 11/2016 A1c 7.4.  . Uterine fibroid     Past Surgical History:  Procedure Laterality Date  . CARPAL TUNNEL RELEASE Right    Dr Burney Gauze  . CATARACT EXTRACTION    . RETINAL TEAR REPAIR CRYOTHERAPY     10/09/13, then again 3/15    There were no vitals filed for this  visit.  Subjective Assessment - 07/10/19 0938    Subjective  Pt reports continued difficulty with stress regarding her husbands cardiac health. She now is wondering if she is having symptoms from AFib also. She has bene having soem breathlessness.    Pertinent History  78 yo Female continues to have right knee pain; She went to orthopedic and had a repeat cortisone injection for right lateral meniscus tear. She reports injection was successful in that she is not having pain with weight bearing in right knee; She is still having right IT band pain; Pt reports that she should not need surgery at this time; She is still having clicking in both knees. She did get shoe inserts but states that they are not working well. She incrementally increased wear and states that they still bothered her. She reports that she hasn't been walking or exercising due to pain and fatigue; She reports not sleeping well and states that when she doesn't sleep well she is more unsteady and having difficulty with her balance; She is having left ankle cramping as a result of new inserts; She reports the pain is worse in thigh and knees after wearing inserts for longer period of time. She feels like it throws off her balance  and is exacerbating burning pain in legs; She also reports increased back pain;    Currently in Pain?  No/denies        TREATMENT: -Standing calf heel off step stretch 30 sec hold x2 reps bilaterally; (reports more left sided stiffness)  Patient instructed in advanced balance exercise  Standing on airex foam with narrow base of support: -Heel/toe raises x15 reps unsupported to challenge stance control; -feet together eyes closed 5x10 sec hold x2 sets each with CGA to close supervision, minimal sway noted; -airex foam fwd toe taps fwd 15x bilat, alternating hands free -airex foam lateral toe taps fwd 15x bilat, hands free -tandem stance unsupported on 2x4 8x each side  -tandem walking on 2x4  unsupported, gentle LOB 50% of time, self correction with // bars      PT Short Term Goals - 06/18/19 0857      PT SHORT TERM GOAL #1   Title  Patient will be adherent to HEP at least 3x a week to improve functional strength and balance for better safety at home.    Baseline  doing most every day;    Time  4    Period  Weeks    Status  Achieved    Target Date  04/16/19      PT SHORT TERM GOAL #2   Title  Patient will increase RLE knee AROM extension to within 5 degrees of neutral for better standing and walking tolerance;     Time  4    Period  Weeks    Status  Achieved    Target Date  04/16/19      PT SHORT TERM GOAL #3   Title  Patient will reports maximum of 1 sleep disturbances per night over last 3 days to exhibit improved sleeping tolerance with less knee pain and reduced fatigue.     Baseline  7/22: usually wakes up to go to the bathroom and then unable to go back to sleep and get comfortable; 8/18: has had increased knee soreness which has caused increased  sleep disturbances; 06/18/19: continues to have sleep disturbances;    Time  4    Period  Weeks    Status  Not Met    Target Date  07/16/19        PT Long Term Goals - 06/18/19 3976      PT LONG TERM GOAL #1   Title  Patient will increase BLE gross strength to 4+/5 as to improve functional strength for independent gait, increased standing tolerance and increased ADL ability.    Time  8    Period  Weeks    Status  Partially Met    Target Date  08/13/19      PT LONG TERM GOAL #2   Title  Patient will increase lower extremity functional scale to >60/80 to demonstrate improved functional mobility and increased tolerance with ADLs.     Baseline  7/22: 43/80, 05/14/19: 50/80    Time  8    Period  Weeks    Status  Partially Met    Target Date  08/13/19      PT LONG TERM GOAL #3   Title  Patient will report a worst pain of 3/10 on VAS in      right knee       to improve tolerance with ADLs and reduced symptoms with  activities.     Baseline  05/14/19: increased ache at night 4/10    Time  8    Period  Weeks    Status  Not Met    Target Date  08/13/19      PT LONG TERM GOAL #4   Title  Patient will complete >1000 feet on 6 min walk test without increase in knee pain to improve community ambulator distance for functional tasks and return to PLOF.    Time  8    Period  Weeks    Status  Achieved    Target Date  08/13/19      PT LONG TERM GOAL #5   Title  Patient will increase Functional Gait Assessment score to >23/30 as to reduce fall risk and improve dynamic gait safety with community ambulation.    Time  8    Period  Weeks    Status  Revised    Target Date  08/13/19            Plan - 07/10/19 0940    Clinical Impression Statement Pt able to complete entire session as planned with rest breaks provided as needed. Pt maintains high level of focus and motivation. Extensiuve verbal, visual, and tactile cues are provided for most accurate form possible. Author provides minA intermittently for full ROM when needed. Overall pt continues to make steady progress toward treatment goals. Pt does surprisingly well on 2x4 for tandem activity.     Rehab Potential  Good    PT Frequency  1x / week    PT Duration  8 weeks    PT Treatment/Interventions  Aquatic Therapy;Cryotherapy;Electrical Stimulation;Moist Heat;Ultrasound;Gait training;Stair training;Functional mobility training;Therapeutic activities;Therapeutic exercise;Balance training;Neuromuscular re-education;Patient/family education;Orthotic Fit/Training;Manual techniques;Passive range of motion;Energy conservation;Taping    PT Next Visit Plan  Revisit HEP assure proper performance and no symptoms aggravation     PT Home Exercise Plan  No updates this date.    Consulted and Agree with Plan of Care  Patient       Patient will benefit from skilled therapeutic intervention in order to improve the following deficits and impairments:  Abnormal gait,  Decreased activity tolerance, Pain, Difficulty walking, Decreased mobility, Decreased balance, Decreased range of motion, Impaired flexibility, Hypomobility, Decreased strength  Visit Diagnosis: Acute pain of right knee  Muscle weakness (generalized)  Unsteadiness on feet  Difficulty in walking, not elsewhere classified     Problem List Patient Active Problem List   Diagnosis Date Noted  . Lumbar sprain 03/28/2019  . Dizziness   . Tinnitus, bilateral 11/28/2017  . Abnormal MRI of head 11/14/2017  . Meningioma ([place]) 09/21/2017  . Ataxia 05/11/2017  . PAF (paroxysmal atrial fibrillation) ([place]) 02/03/2017  . Preventative health care 06/02/2016  . Advance directive discussed with patient 06/02/2016  . Achilles tendonitis 03/11/2016  . Hypertension   . DVT, lower extremity, recurrent ([place])   . Type 2 diabetes, controlled, with peripheral neuropathy ([place])   . Generalized osteoarthritis of multiple sites   . GERD (gastroesophageal reflux disease)   . Hyperthyroidism   . Retinal hole of right eye 06/26/2014  . Amblyopia, right 05/08/2014  . Status post intraocular lens implant 05/08/2014   10:11 AM, 07/10/19 Etta Grandchild, PT, DPT Physical Therapist - [facility]  [phone] ([address]    [place] C 07/10/2019, 9:44 AM  [place] MAIN [facility] SERVICES [address] [place], [place], [address] Phone: [phone]   Fax:  [phone]  Name: Jaleeya Mcnelly MRN: [id number] Date of Birth: [birth year]

## 2019-07-17 ENCOUNTER — Ambulatory Visit: Payer: Medicare Other | Admitting: Physical Therapy

## 2019-07-17 ENCOUNTER — Other Ambulatory Visit: Payer: Self-pay

## 2019-07-17 ENCOUNTER — Encounter: Payer: Self-pay | Admitting: Physical Therapy

## 2019-07-17 DIAGNOSIS — M6281 Muscle weakness (generalized): Secondary | ICD-10-CM | POA: Diagnosis not present

## 2019-07-17 DIAGNOSIS — M25561 Pain in right knee: Secondary | ICD-10-CM | POA: Diagnosis not present

## 2019-07-17 DIAGNOSIS — R2681 Unsteadiness on feet: Secondary | ICD-10-CM | POA: Diagnosis not present

## 2019-07-17 DIAGNOSIS — R262 Difficulty in walking, not elsewhere classified: Secondary | ICD-10-CM

## 2019-07-17 NOTE — Therapy (Signed)
[place] MAIN [facility] SERVICES [address] [place], [place], [address] Phone: [phone]   Fax:  [phone]  Physical Therapy Treatment Discharge Summary  Patient Details  Name: Angela Bentley MRN: [id number] Date of Birth: [birth year] Referring Provider (PT): Dr. [name] PCP   Encounter Date: 07/17/2019  PT End of Session - 07/17/19 1005    Visit Number  23    Number of Visits  30    Date for PT Re-Evaluation  08/13/19    PT Start Time  0932    PT Stop Time  1015    PT Time Calculation (min)  43 min    Equipment Utilized During Treatment  Gait belt    Activity Tolerance  Patient tolerated treatment well;No increased pain    Behavior During Therapy  WFL for tasks assessed/performed       Past Medical History:  Diagnosis Date  . Balance problems   . Brain tumor ([place])    a. Left intraventricular tumor ->stable by 06/2017 MRI. Followed @ [facility].  . Gait disturbance    a. Unsteady on feet/balance difficulty.  . Generalized osteoarthritis of multiple sites   . GERD (gastroesophageal reflux disease)   . History of DVT (deep vein thrombosis) 2016   estrogen and airflights. Rx with xarelto for 3 months  . History of stress test    a. 11/2016 ETT: No ST/T changes.  Performed to assess PAC/PVC burden with activity ->No ectopy noted.  . Hypertension   . Hyperthyroidism    treated briefly in college  . Insomnia   . Obesity   . PAF (paroxysmal atrial fibrillation) ([place])    a. 12/2016 Event Monitor: brief run of PAF-->CHA2DS2VASc = 5--> Xarelto.  . Plantar fasciitis   . Retinal tear of left eye   . Rosacea   . Type 2 diabetes, controlled, with peripheral neuropathy ([place])    a. 11/2016 A1c 7.4.  . Uterine fibroid     Past Surgical History:  Procedure Laterality Date  . CARPAL TUNNEL RELEASE Right    Dr Burney Gauze  . CATARACT EXTRACTION    . RETINAL TEAR REPAIR CRYOTHERAPY     10/09/13, then again 3/15    There were no vitals filed for  this visit.  Subjective Assessment - 07/17/19 0935    Subjective  Patient reports sleeping with pillow between knee. She reports not noticing knee discomfort much when she is walking but it is tender and tight when she rubs it.    Pertinent History  78 yo Female continues to have right knee pain; She went to orthopedic and had a repeat cortisone injection for right lateral meniscus tear. She reports injection was successful in that she is not having pain with weight bearing in right knee; She is still having right IT band pain; Pt reports that she should not need surgery at this time; She is still having clicking in both knees. She did get shoe inserts but states that they are not working well. She incrementally increased wear and states that they still bothered her. She reports that she hasn't been walking or exercising due to pain and fatigue; She reports not sleeping well and states that when she doesn't sleep well she is more unsteady and having difficulty with her balance; She is having left ankle cramping as a result of new inserts; She reports the pain is worse in thigh and knees after wearing inserts for longer period of time. She feels like it throws off her  balance and is exacerbating burning pain in legs; She also reports increased back pain;    Currently in Pain?  Yes    Pain Score  2     Pain Location  Knee    Pain Orientation  Right    Pain Descriptors / Indicators  Aching;Sore    Pain Type  Chronic pain    Pain Onset  More than a month ago    Pain Frequency  Intermittent    Aggravating Factors   worse at night when sleeping    Pain Relieving Factors  heat/stretches    Effect of Pain on Daily Activities  better with stretch/movement;    Multiple Pain Sites  No         [facility] PT Assessment - 07/17/19 0001      Strength   Overall Strength Comments  BLE are WFL with exception: R hip abduction 4/5, LLE hip abduction 3+/5, right knee extension: 4+/5      Functional Gait  Assessment    Gait Level Surface  Walks 20 ft in less than 5.5 sec, no assistive devices, good speed, no evidence for imbalance, normal gait pattern, deviates no more than 6 in outside of the 12 in walkway width.    Change in Gait Speed  Able to smoothly change walking speed without loss of balance or gait deviation. Deviate no more than 6 in outside of the 12 in walkway width.    Gait with Horizontal Head Turns  Performs head turns smoothly with slight change in gait velocity (eg, minor disruption to smooth gait path), deviates 6-10 in outside 12 in walkway width, or uses an assistive device.    Gait with Vertical Head Turns  Performs task with slight change in gait velocity (eg, minor disruption to smooth gait path), deviates 6 - 10 in outside 12 in walkway width or uses assistive device    Gait and Pivot Turn  Pivot turns safely within 3 sec and stops quickly with no loss of balance.    Step Over Obstacle  Is able to step over one shoe box (4.5 in total height) without changing gait speed. No evidence of imbalance.    Gait with Narrow Base of Support  Ambulates less than 4 steps heel to toe or cannot perform without assistance.    Gait with Eyes Closed  Walks 20 ft, slow speed, abnormal gait pattern, evidence for imbalance, deviates 10-15 in outside 12 in walkway width. Requires more than 9 sec to ambulate 20 ft.    Ambulating Backwards  Walks 20 ft, uses assistive device, slower speed, mild gait deviations, deviates 6-10 in outside 12 in walkway width.    Steps  Alternating feet, must use rail.    Total Score  20        TREATMENT: Warm up on Nustep BUE/BLE level 2 x5 min (unbilled);  PT assessed patient's strength, see above: Supine: SLR hip flexion x10 reps each LE with increased difficulty reported on LLE; denies any knee pain; PT utilized rolling stick to RLE x3-4 min to reduce tightness in right quad; tolerated well;    NMR: Instructed patient in FGA, see above; She exhibits increased  unsteadiness with veering to left side this session compared to previous session;  Reinforced HEP with instruction to focus on static standing balance exercise such as rhomberg when feeling unsteady and then to try dynamic balance exercise when feeling less unsteady; See patient instructions;  Patient verbalized understanding of HEP;  PT Education - 07/17/19 0936    Education Details  balance/strength; POC    Person(s) Educated  Patient    Methods  Explanation;Verbal cues    Comprehension  Verbalized understanding;Returned demonstration;Verbal cues required;Need further instruction       PT Short Term Goals - 07/17/19 0939      PT SHORT TERM GOAL #1   Title  Patient will be adherent to HEP at least 3x a week to improve functional strength and balance for better safety at home.    Baseline  doing most every day;    Time  4    Period  Weeks    Status  Achieved    Target Date  04/16/19      PT SHORT TERM GOAL #2   Title  Patient will increase RLE knee AROM extension to within 5 degrees of neutral for better standing and walking tolerance;     Time  4    Period  Weeks    Status  Achieved    Target Date  04/16/19      PT SHORT TERM GOAL #3   Title  Patient will reports maximum of 1 sleep disturbances per night over last 3 days to exhibit improved sleeping tolerance with less knee pain and reduced fatigue.     Baseline  7/22: usually wakes up to go to the bathroom and then unable to go back to sleep and get comfortable; 8/18: has had increased knee soreness which has caused increased  sleep disturbances; 06/18/19: continues to have sleep disturbances; 10/21: hasn't had many sleep disturbances with pillow between knees;    Time  4    Period  Weeks    Status  Achieved    Target Date  07/16/19        PT Long Term Goals - 07/17/19 0939      PT LONG TERM GOAL #1   Title  Patient will increase BLE gross strength to 4+/5 as to improve functional  strength for independent gait, increased standing tolerance and increased ADL ability.    Time  8    Period  Weeks    Status  Partially Met    Target Date  08/13/19      PT LONG TERM GOAL #2   Title  Patient will increase lower extremity functional scale to >60/80 to demonstrate improved functional mobility and increased tolerance with ADLs.     Baseline  7/22: 43/80, 05/14/19: 50/80, 10/21: 53/80    Time  8    Period  Weeks    Status  Partially Met    Target Date  08/13/19      PT LONG TERM GOAL #3   Title  Patient will report a worst pain of 3/10 on VAS in      right knee       to improve tolerance with ADLs and reduced symptoms with activities.     Baseline  05/14/19: increased ache at night 4/10, 07/17/19: 3-4/10 in knee;    Time  8    Period  Weeks    Status  Partially Met    Target Date  08/13/19      PT LONG TERM GOAL #4   Title  Patient will complete >1000 feet on 6 min walk test without increase in knee pain to improve community ambulator distance for functional tasks and return to PLOF.    Time  8    Period  Weeks    Status  Achieved  Target Date  08/13/19      PT LONG TERM GOAL #5   Title  Patient will increase Functional Gait Assessment score to >23/30 as to reduce fall risk and improve dynamic gait safety with community ambulation.    Time  8    Period  Weeks    Status  Not Met    Target Date  08/13/19            Plan - 07/17/19 1042    Clinical Impression Statement  Patient presents to therapy reporting impaired balance and continued intermittent right knee pain. She does report her knee pain has diminished to a tolerable level. She reports less sleep disturbances when sleeping with pillow between knees. She also reports less discomfort with rubbing knee and using rolling stick. Patient does have some weakness in BLE but that has improved overall since start of session. She also tested as an increased risk for falls with increased veering noted this session  with instability towards left side. Patient re-educated on HEP wiht instruction to focus on sensory organization exercise such as static standing with eyes closed on firm/foam surface to reorient proprioception when feeling unsteady. And then progressing balance exercise to dynamic exercise when feeling more steady. Pt verbalized understanding. While she has not met all goals, she is appropriate for discharge at this time as she has an extensive HEP which she is independent.    Rehab Potential  Good    Clinical Impairments Affecting Rehab Potential  motivated, good PLOF and good caregiver support    PT Frequency  1x / week    PT Duration  8 weeks    PT Treatment/Interventions  Aquatic Therapy;Cryotherapy;Electrical Stimulation;Moist Heat;Ultrasound;Gait training;Stair training;Functional mobility training;Therapeutic activities;Therapeutic exercise;Balance training;Neuromuscular re-education;Patient/family education;Orthotic Fit/Training;Manual techniques;Passive range of motion;Energy conservation;Taping    PT Next Visit Plan  Revisit HEP assure proper performance and no symptoms aggravation     PT Home Exercise Plan  continue as previously given;    Consulted and Agree with Plan of Care  Patient       Patient will benefit from skilled therapeutic intervention in order to improve the following deficits and impairments:  Abnormal gait, Decreased activity tolerance, Pain, Difficulty walking, Decreased mobility, Decreased balance, Decreased range of motion, Impaired flexibility, Hypomobility, Decreased strength  Visit Diagnosis: Acute pain of right knee  Muscle weakness (generalized)  Unsteadiness on feet  Difficulty in walking, not elsewhere classified     Problem List Patient Active Problem List   Diagnosis Date Noted  . Lumbar sprain 03/28/2019  . Dizziness   . Tinnitus, bilateral 11/28/2017  . Abnormal MRI of head 11/14/2017  . Meningioma ([place]) 09/21/2017  . Ataxia 05/11/2017  .  PAF (paroxysmal atrial fibrillation) ([place]) 02/03/2017  . Preventative health care 06/02/2016  . Advance directive discussed with patient 06/02/2016  . Achilles tendonitis 03/11/2016  . Hypertension   . DVT, lower extremity, recurrent ([place])   . Type 2 diabetes, controlled, with peripheral neuropathy ([place])   . Generalized osteoarthritis of multiple sites   . GERD (gastroesophageal reflux disease)   . Hyperthyroidism   . Retinal hole of right eye 06/26/2014  . Amblyopia, right 05/08/2014  . Status post intraocular lens implant 05/08/2014    Therasa Lorenzi,MargaretPT, DPT 07/17/2019, 10:48 AM  [place] MAIN [facility] SERVICES [place], [place], [address] Phone: [phone]   Fax:  [phone]  Name: Angela Bentley MRN: [id number] Date of Birth: March 15, 1941

## 2019-07-17 NOTE — Patient Instructions (Signed)
Try to spend 10-15 min each day working on balance and then 10-15 min each day working on strength;    On days when your balance is more impaired: -work on static balance exercise, such as standing on foam with eyes open/closed, standing on incline with eyes open/closed, standing on foam with head turns etc.   On days when your balance is better: - try harder exercise such as walking heel to toe, side stepping down hallway, standing on one leg with leg kicks etc.

## 2019-09-25 ENCOUNTER — Encounter: Payer: Self-pay | Admitting: Orthopedic Surgery

## 2019-10-04 ENCOUNTER — Encounter: Payer: Self-pay | Admitting: Orthopedic Surgery

## 2019-10-04 ENCOUNTER — Ambulatory Visit (INDEPENDENT_AMBULATORY_CARE_PROVIDER_SITE_OTHER): Payer: Medicare Other | Admitting: Orthopedic Surgery

## 2019-10-04 ENCOUNTER — Other Ambulatory Visit: Payer: Self-pay

## 2019-10-04 DIAGNOSIS — S838X1A Sprain of other specified parts of right knee, initial encounter: Secondary | ICD-10-CM | POA: Diagnosis not present

## 2019-10-04 MED ORDER — LIDOCAINE HCL 1 % IJ SOLN
5.0000 mL | INTRAMUSCULAR | Status: AC | PRN
  Administered 2019-10-04: 5 mL

## 2019-10-04 MED ORDER — BUPIVACAINE HCL 0.25 % IJ SOLN
4.0000 mL | INTRAMUSCULAR | Status: AC | PRN
  Administered 2019-10-04: 4 mL via INTRA_ARTICULAR

## 2019-10-04 MED ORDER — METHYLPREDNISOLONE ACETATE 40 MG/ML IJ SUSP
40.0000 mg | INTRAMUSCULAR | Status: AC | PRN
  Administered 2019-10-04: 40 mg via INTRA_ARTICULAR

## 2019-10-04 NOTE — Progress Notes (Signed)
Office Visit Note   Patient: Angela Bentley           Date of Birth: 01-Dec-1940           MRN: [id number] Visit Date: 10/04/2019 Requested by: [name], MD [place],  [place] [address] PCP: [name], MD  Subjective: Chief Complaint  Patient presents with  . Right Knee - Pain    HPI: Angela Bentley is a patient with right knee pain. She has had a recent recurrence of her pain for the last 3 weeks. Reports primarily medial sided pain. Describes swelling as well as a knot on the lateral aspect of the knee. This did start out as low back pain. She denies any specific injury. Did have shots in her knee last year cortisone in February and June. She has been favoring the knee. Denies any mechanical symptoms.              ROS: All systems reviewed are negative as they relate to the chief complaint within the history of present illness.  Patient denies  fevers or chills.   Assessment & Plan: Visit Diagnoses:  1. Injury of meniscus of right knee, initial encounter     Plan: Impression is right knee pain with recurrent effusion and joint line tenderness. Effusion is more than she has had in the past. Previous MRI scan showed degenerative type horizontal meniscal tear but not much in the way of arthritis. I think her meniscal tear may be getting more symptomatic. Repeat aspiration injection performed today. Gel injection could be a next step. Patient is 79 years old and I do not think that arthroscopy and debridement would be indicated particularly with her history of DVT. Nonetheless we will consider gel injection next if this does not give her lasting relief. This does not look like radicular pain from the back with the amount of effusion she has.  Follow-Up Instructions: Return if symptoms worsen or fail to improve.   Orders:  No orders of the defined types were placed in this encounter.  No orders of the defined types were placed in this encounter.     Procedures: Large Joint Inj: R knee on 10/04/2019 6:16 PM Indications: diagnostic evaluation, joint swelling and pain Details: 18 G 1.5 in needle, superolateral approach  Arthrogram: No  Medications: 5 mL lidocaine 1 %; 40 mg methylPREDNISolone acetate 40 MG/ML; 4 mL bupivacaine 0.25 % Aspirate: 25 mL yellow Outcome: tolerated well, no immediate complications Procedure, treatment alternatives, risks and benefits explained, specific risks discussed. Consent was given by the patient. Immediately prior to procedure a time out was called to verify the correct patient, procedure, equipment, support staff and site/side marked as required. Patient was prepped and draped in the usual sterile fashion.       Clinical Data: No additional findings.  Objective: Vital Signs: LMP 09/26/1997   Physical Exam:   Constitutional: Patient appears well-developed HEENT:  Head: Normocephalic Eyes:EOM are normal Neck: Normal range of motion Cardiovascular: Normal rate Pulmonary/chest: Effort normal Neurologic: Patient is alert Skin: Skin is warm Psychiatric: Patient has normal mood and affect    Ortho Exam: Ortho exam demonstrates full active and passive range of motion of the left knee. Right knee has about a 5 degree flexion contracture with mild effusion. Patient has both medial and lateral joint line tenderness. Collateral and cruciate ligaments are stable. No calf tenderness to direct palpation on the right with negative Homans. No groin pain with internal  or external rotation of the leg and no nerve root tension signs.  Specialty Comments:  No specialty comments available.  Imaging: No results found.   PMFS History: Patient Active Problem List   Diagnosis Date Noted  . Lumbar sprain 03/28/2019  . Dizziness   . Tinnitus, bilateral 11/28/2017  . Abnormal MRI of head 11/14/2017  . Meningioma ([place]) 09/21/2017  . Ataxia 05/11/2017  . PAF (paroxysmal atrial fibrillation) ([place]) 02/03/2017    . Preventative health care 06/02/2016  . Advance directive discussed with patient 06/02/2016  . Achilles tendonitis 03/11/2016  . Hypertension   . DVT, lower extremity, recurrent ([place])   . Type 2 diabetes, controlled, with peripheral neuropathy ([place])   . Generalized osteoarthritis of multiple sites   . GERD (gastroesophageal reflux disease)   . Hyperthyroidism   . Retinal hole of right eye 06/26/2014  . Amblyopia, right 05/08/2014  . Status post intraocular lens implant 05/08/2014   Past Medical History:  Diagnosis Date  . Balance problems   . Brain tumor ([place])    a. Left intraventricular tumor ->stable by 06/2017 MRI. Followed @ [facility].  . Gait disturbance    a. Unsteady on feet/balance difficulty.  . Generalized osteoarthritis of multiple sites   . GERD (gastroesophageal reflux disease)   . History of DVT (deep vein thrombosis) 2016   estrogen and airflights. Rx with xarelto for 3 months  . History of stress test    a. 11/2016 ETT: No ST/T changes.  Performed to assess PAC/PVC burden with activity ->No ectopy noted.  . Hypertension   . Hyperthyroidism    treated briefly in college  . Insomnia   . Obesity   . PAF (paroxysmal atrial fibrillation) ([place])    a. 12/2016 Event Monitor: brief run of PAF-->CHA2DS2VASc = 5--> Xarelto.  . Plantar fasciitis   . Retinal tear of left eye   . Rosacea   . Type 2 diabetes, controlled, with peripheral neuropathy ([place])    a. 11/2016 A1c 7.4.  . Uterine fibroid     Family History  Problem Relation Age of Onset  . Diabetes Father   . Pancreatic cancer Father   . Diabetes Other        grandmother    Past Surgical History:  Procedure Laterality Date  . CARPAL TUNNEL RELEASE Right    Dr Burney Gauze  . CATARACT EXTRACTION    . RETINAL TEAR REPAIR CRYOTHERAPY     10/09/13, then again 3/15   Social History   Occupational History  . Occupation: Non Patent attorney    Comment: [employer] and others  Tobacco Use  . Smoking status: Former Smoker     Quit date: 09/26/1993    Years since quitting: 26.0  . Smokeless tobacco: Never Used  . Tobacco comment: quit in 1996  Substance and Sexual Activity  . Alcohol use: Yes    Alcohol/week: 0.0 - 1.0 standard drinks  . Drug use: No  . Sexual activity: Not Currently    Partners: Male

## 2019-10-08 DIAGNOSIS — Z23 Encounter for immunization: Secondary | ICD-10-CM | POA: Diagnosis not present

## 2019-10-09 ENCOUNTER — Other Ambulatory Visit: Payer: Self-pay

## 2019-10-09 ENCOUNTER — Ambulatory Visit (INDEPENDENT_AMBULATORY_CARE_PROVIDER_SITE_OTHER): Payer: Medicare Other | Admitting: Internal Medicine

## 2019-10-09 ENCOUNTER — Encounter: Payer: Self-pay | Admitting: Internal Medicine

## 2019-10-09 VITALS — BP 140/82 | HR 83 | Temp 98.3°F | Ht 65.5 in | Wt 204.2 lb

## 2019-10-09 DIAGNOSIS — D329 Benign neoplasm of meninges, unspecified: Secondary | ICD-10-CM | POA: Diagnosis not present

## 2019-10-09 DIAGNOSIS — Z7189 Other specified counseling: Secondary | ICD-10-CM | POA: Diagnosis not present

## 2019-10-09 DIAGNOSIS — E1142 Type 2 diabetes mellitus with diabetic polyneuropathy: Secondary | ICD-10-CM | POA: Diagnosis not present

## 2019-10-09 DIAGNOSIS — N3281 Overactive bladder: Secondary | ICD-10-CM | POA: Diagnosis not present

## 2019-10-09 DIAGNOSIS — Z0000 Encounter for general adult medical examination without abnormal findings: Secondary | ICD-10-CM | POA: Diagnosis not present

## 2019-10-09 DIAGNOSIS — I82409 Acute embolism and thrombosis of unspecified deep veins of unspecified lower extremity: Secondary | ICD-10-CM | POA: Diagnosis not present

## 2019-10-09 DIAGNOSIS — I480 Paroxysmal atrial fibrillation: Secondary | ICD-10-CM

## 2019-10-09 LAB — COMPREHENSIVE METABOLIC PANEL
ALT: 20 U/L (ref 0–35)
AST: 19 U/L (ref 0–37)
Albumin: 4.4 g/dL (ref 3.5–5.2)
Alkaline Phosphatase: 66 U/L (ref 39–117)
BUN: 16 mg/dL (ref 6–23)
CO2: 30 mEq/L (ref 19–32)
Calcium: 9.7 mg/dL (ref 8.4–10.5)
Chloride: 100 mEq/L (ref 96–112)
Creatinine, Ser: 0.79 mg/dL (ref 0.40–1.20)
GFR: 70 mL/min (ref >60.00)
Glucose, Bld: 142 mg/dL — ABNORMAL HIGH (ref 70–99)
Potassium: 4 mEq/L (ref 3.5–5.1)
Sodium: 138 mEq/L (ref 135–145)
Total Bilirubin: 0.6 mg/dL (ref 0.2–1.2)
Total Protein: 6.8 g/dL (ref 6.0–8.3)

## 2019-10-09 LAB — HM DIABETES FOOT EXAM

## 2019-10-09 LAB — LIPID PANEL
Cholesterol: 173 mg/dL (ref 0–200)
HDL: 48.1 mg/dL (ref >39.00)
LDL Cholesterol: 91 mg/dL (ref 0–99)
NonHDL: 124.91
Total CHOL/HDL Ratio: 4
Triglycerides: 170 mg/dL — ABNORMAL HIGH (ref 0.0–149.0)
VLDL: 34 mg/dL (ref 0.0–40.0)

## 2019-10-09 LAB — CBC
HCT: 37.8 % (ref 36.0–46.0)
Hemoglobin: 12.5 g/dL (ref 12.0–15.0)
MCHC: 33.2 g/dL (ref 30.0–36.0)
MCV: 88 fl (ref 78.0–100.0)
Platelets: 260 10*3/uL (ref 150.0–400.0)
RBC: 4.29 Mil/uL (ref 3.87–5.11)
RDW: 13.9 % (ref 11.5–15.5)
WBC: 9.6 10*3/uL (ref 4.0–10.5)

## 2019-10-09 LAB — HEMOGLOBIN A1C: Hgb A1c MFr Bld: 7.6 % — ABNORMAL HIGH (ref 4.6–6.5)

## 2019-10-09 NOTE — Progress Notes (Signed)
Subjective:    Patient ID: Angela Bentley, female    DOB: March 08, 1941, 79 y.o.   MRN: [id number]  HPI Here for Medicare wellness visit and follow up of chronic health conditions  This visit occurred during the SARS-CoV-2 public health emergency.  Safety protocols were in place, including screening questions prior to the visit, additional usage of staff PPE, and extensive cleaning of exam room while observing appropriate contact time as indicated for disinfecting solutions.   Reviewed form and advanced directives Reviewed other doctors Occasional alcohol No tobacco Tries to exercise regularly--but not that often since pandemic (knee is limiting her) No falls No sig depression--but tough with pandemic, etc. Not anhedonic Independent with instrumental ADLs. Doesn't drive much now Trouble with names--no serious memory issues  Having trouble with constipation Can get very bad---quite hard if hasn't gone in 3 days Seems to be voiding more--but only uses the myrbetriq once in a while Discussed trying miralax  Feels diabetes is controlled--but wondered about the increased urination Checks monthly--usually 120 non fasting Feet about the same--some night cramps. Sensation not significantly a problem  No palpitations --or rare No chest pain or SOB Balance is off some--no dizziness  Did see neurosurgeon about the meningioma Will continue recurrent MRIs  Current Outpatient Medications on File Prior to Visit  Medication Sig Dispense Refill  . celecoxib (CELEBREX) 200 MG capsule TAKE 1 CAPSULE BY MOUTH ONCE DAILY AS NEEDED FOR MILD TO MODERATE PAIN 30 capsule 5  . diclofenac sodium (VOLTAREN) 1 % GEL Apply 4 g topically 4 (four) times daily. (Patient taking differently: Apply 4 g topically 4 (four) times daily as needed. ) 100 g 2  . fluticasone (VERAMYST) 27.5 MCG/SPRAY nasal spray Place 2 sprays into the nose daily as needed for allergies.     . furosemide (LASIX) 20 MG tablet Take 1  tablet (20 mg total) by mouth daily as needed. 30 tablet 3  . glimepiride (AMARYL) 1 MG tablet TAKE 1 TABLET EVERY DAY WITH BREAKFAST 90 tablet 3  . losartan-hydrochlorothiazide (HYZAAR) 100-12.5 MG tablet TAKE 1 TABLET EVERY DAY 90 tablet 3  . metoprolol succinate (TOPROL-XL) 25 MG 24 hr tablet TAKE 1 TABLET (25 MG TOTAL) BY MOUTH DAILY. 90 tablet 3  . mirabegron ER (MYRBETRIQ) 25 MG TB24 tablet Take 1 tablet (25 mg total) by mouth daily. One po qd 30 tablet 2  . Multiple Vitamin (MULTIVITAMIN) tablet Take 1 tablet by mouth daily.    Marland Kitchen nystatin cream (MYCOSTATIN) Apply 1 application topically 2 (two) times daily. Apply to affected area BID for up to 7 days. (Patient taking differently: Apply 1 application topically 2 (two) times daily. Apply to affected area BID PRN) 30 g 1  . pioglitazone (ACTOS) 45 MG tablet TAKE 1 TABLET EVERY DAY 90 tablet 3  . potassium chloride SA (K-DUR,KLOR-CON) 20 MEQ tablet Take 1 tablet (20 mEq total) by mouth daily as needed. 30 tablet 3  . XARELTO 20 MG TABS tablet TAKE 1 TABLET (20 MG TOTAL) BY MOUTH DAILY WITH SUPPER. 90 tablet 3   No current facility-administered medications on file prior to visit.    Allergies  Allergen Reactions  . Penicillins     Has patient had a PCN reaction causing immediate rash, facial/tongue/throat swelling, SOB or lightheadedness with hypotension: Yes Has patient had a PCN reaction causing severe rash involving mucus membranes or skin necrosis: Yes Has patient had a PCN reaction that required hospitalization No Has patient had a PCN reaction occurring within  the last 10 years: No If all of the above answers are "NO", then may proceed with Cephalosporin use.    . Sulfa Antibiotics     Rash/itching  . Azithromycin Palpitations    Past Medical History:  Diagnosis Date  . Balance problems   . Brain tumor ([place])    a. Left intraventricular tumor ->stable by 06/2017 MRI. Followed @ [facility].  . Gait disturbance    a. Unsteady on  feet/balance difficulty.  . Generalized osteoarthritis of multiple sites   . GERD (gastroesophageal reflux disease)   . History of DVT (deep vein thrombosis) 2016   estrogen and airflights. Rx with xarelto for 3 months  . History of stress test    a. 11/2016 ETT: No ST/T changes.  Performed to assess PAC/PVC burden with activity ->No ectopy noted.  . Hypertension   . Hyperthyroidism    treated briefly in college  . Insomnia   . Obesity   . PAF (paroxysmal atrial fibrillation) ([place])    a. 12/2016 Event Monitor: brief run of PAF-->CHA2DS2VASc = 5--> Xarelto.  . Plantar fasciitis   . Retinal tear of left eye   . Rosacea   . Type 2 diabetes, controlled, with peripheral neuropathy ([place])    a. 11/2016 A1c 7.4.  . Uterine fibroid     Past Surgical History:  Procedure Laterality Date  . CARPAL TUNNEL RELEASE Right    Dr Burney Gauze  . CATARACT EXTRACTION    . RETINAL TEAR REPAIR CRYOTHERAPY     10/09/13, then again 3/15    Family History  Problem Relation Age of Onset  . Diabetes Father   . Pancreatic cancer Father   . Diabetes Other        grandmother    Social History   Socioeconomic History  . Marital status: Married    Spouse name: Not on file  . Number of children: 3  . Years of education: Not on file  . Highest education level: Not on file  Occupational History  . Occupation: Non Patent attorney    Comment: [employer] and others  Tobacco Use  . Smoking status: Former Smoker    Quit date: 09/26/1993    Years since quitting: 26.0  . Smokeless tobacco: Never Used  . Tobacco comment: quit in 1996  Substance and Sexual Activity  . Alcohol use: Yes    Alcohol/week: 0.0 - 1.0 standard drinks  . Drug use: No  . Sexual activity: Not Currently    Partners: Male  Other Topics Concern  . Not on file  Social History Narrative   Has living will   Husband is health care POA-- or daughter Izora Gala   Would accept resuscitation attempts   Would not want tube feeds if cognitively  unaware   Social Determinants of Health   Financial Resource Strain:   . Difficulty of Paying Living Expenses: Not on file  Food Insecurity:   . Worried About Charity fundraiser in the Last Year: Not on file  . Ran Out of Food in the Last Year: Not on file  Transportation Needs:   . Lack of Transportation (Medical): Not on file  . Lack of Transportation (Non-Medical): Not on file  Physical Activity:   . Days of Exercise per Week: Not on file  . Minutes of Exercise per Session: Not on file  Stress:   . Feeling of Stress : Not on file  Social Connections:   . Frequency of Communication with Friends and Family: Not on  file  . Frequency of Social Gatherings with Friends and Family: Not on file  . Attends Religious Services: Not on file  . Active Member of Clubs or Organizations: Not on file  . Attends Archivist Meetings: Not on file  . Marital Status: Not on file  Intimate Partner Violence:   . Fear of Current or Ex-Partner: Not on file  . Emotionally Abused: Not on file  . Physically Abused: Not on file  . Sexually Abused: Not on file   Review of Systems Knee pain is affecting sleep---has to sleep on back Appetite is fine Weight is about the same Wears seat belt Teeth are fine--now at [place] dentist No heartburn. Some congestion in throat--but no true dysphagia Keeps up with dermatologist    Objective:   Physical Exam  Constitutional: She is oriented to person, place, and time. She appears well-developed. No distress.  HENT:  Mouth/Throat: Oropharynx is clear and moist. No oropharyngeal exudate.  Neck: No thyromegaly present.  Cardiovascular: Normal rate, regular rhythm, normal heart sounds and intact distal pulses. Exam reveals no gallop.  No murmur heard. Respiratory: Effort normal and breath sounds normal. No respiratory distress. She has no wheezes. She has no rales.  GI: Soft. There is no abdominal tenderness.  Musculoskeletal:        General:  No tenderness or edema.  Lymphadenopathy:    She has no cervical adenopathy.  Neurological: She is alert and oriented to person, place, and time.  President--- "Dwaine Deter, Bush" [phone] [id number] Recall 3/3  Fairly normal sensation in feet  Skin: No rash noted. No erythema.  No foot lesions  Psychiatric: She has a normal mood and affect. Her behavior is normal.           Assessment & Plan:

## 2019-10-09 NOTE — Assessment & Plan Note (Signed)
No symptoms Is on the [employer] with cardiology

## 2019-10-09 NOTE — Assessment & Plan Note (Signed)
Ongoing leg pain--related to her knee No clear evidence of recurrence Is on the xarelto indefinitely due to atrial fib

## 2019-10-09 NOTE — Assessment & Plan Note (Signed)
I have personally reviewed the Medicare Annual Wellness questionnaire and have noted 1. The patient's medical and social history 2. Their use of alcohol, tobacco or illicit drugs 3. Their current medications and supplements 4. The patient's functional ability including ADL's, fall risks, home safety risks and hearing or visual             impairment. 5. Diet and physical activities 6. Evidence for depression or mood disorders  The patients weight, height, BMI and visual acuity have been recorded in the chart I have made referrals, counseling and provided education to the patient based review of the above and I have provided the pt with a written personalized care plan for preventive services.  I have provided you with a copy of your personalized plan for preventive services. Please take the time to review along with your updated medication list.  Yearly flu vaccine Discussed fitness No cancer screening due to age Had first COVID vaccine

## 2019-10-09 NOTE — Patient Instructions (Signed)
Please try miralax daily---start with a full packet or capful daily in a glass of water. You can adjust the dose as needed.

## 2019-10-09 NOTE — Assessment & Plan Note (Signed)
Discussed trying to drink more May need to try the myrbetriq regularly to see if that helps

## 2019-10-09 NOTE — Assessment & Plan Note (Signed)
Will continue with neurosurgeon  No action otherwise (follow MRI)

## 2019-10-09 NOTE — Assessment & Plan Note (Signed)
See social history 

## 2019-10-09 NOTE — Assessment & Plan Note (Signed)
She feels the control is fine Will check labs Continue current meds Very mild neuropathy

## 2019-10-21 IMAGING — DX DG KNEE COMPLETE 4+V*R*
4 series · 4 of 4 positions shown · non-contrast
Comparison: None.

CLINICAL DATA: Right knee pain and limited range of motion for 3
weeks. No known injury.

EXAM:
RIGHT KNEE - COMPLETE 4+ VIEW

[knee ap]
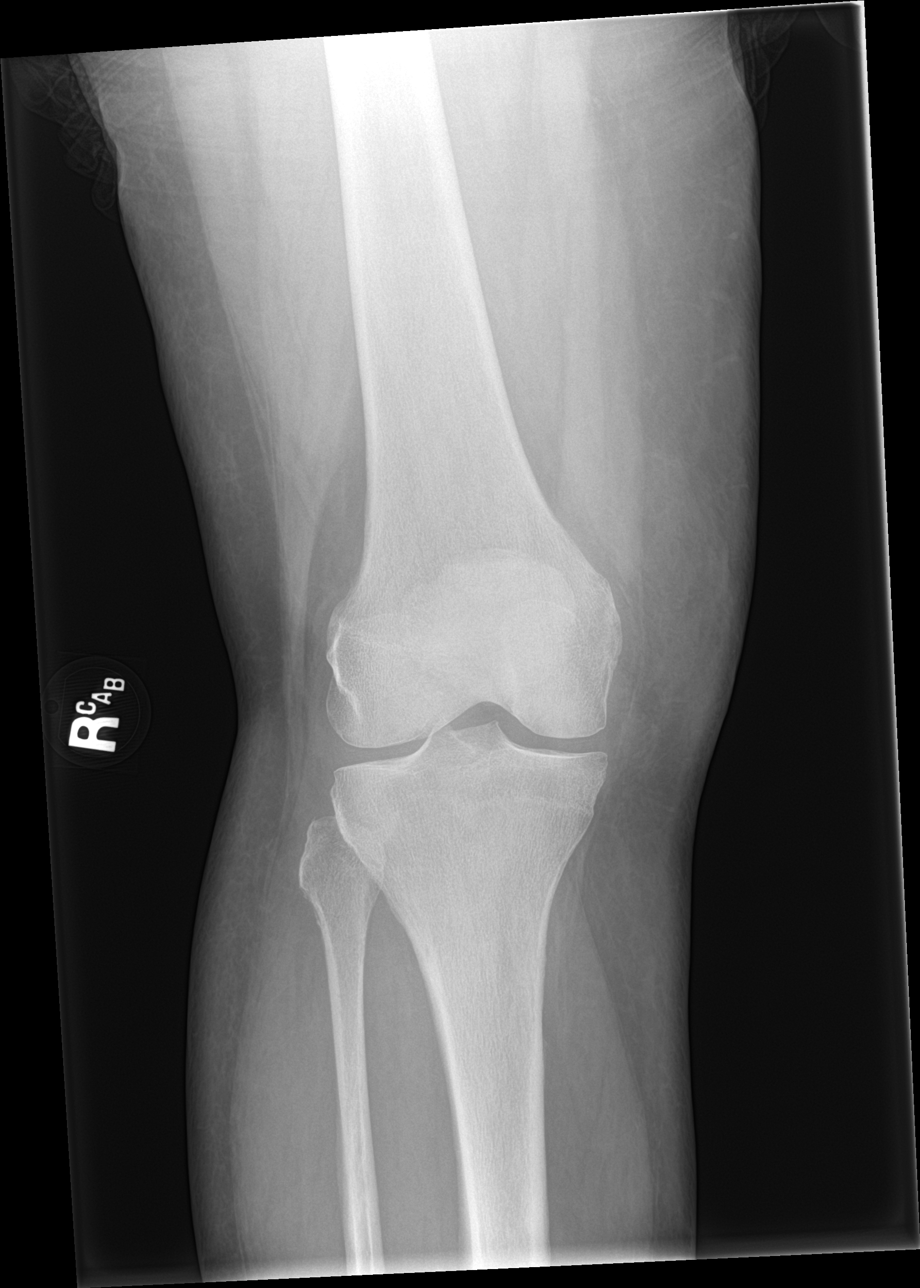

[knee lat]
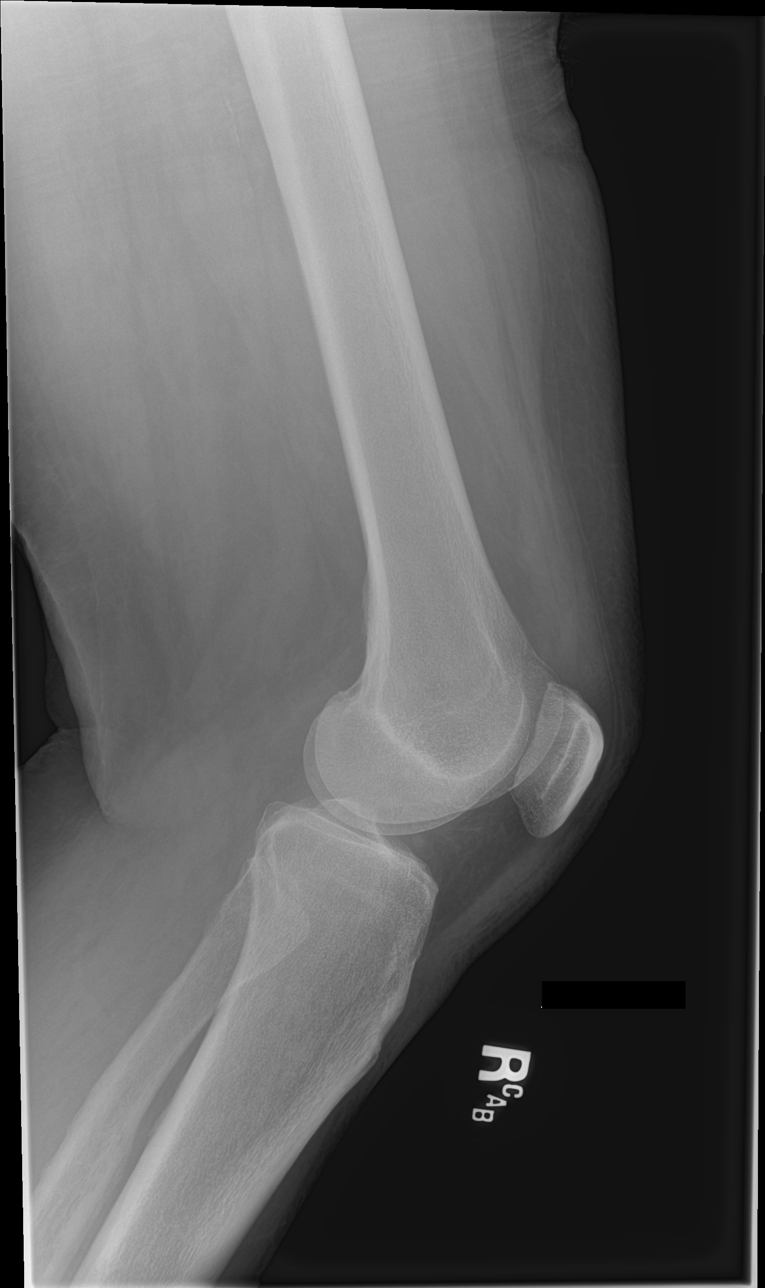

[knee obl (1 of 2)]
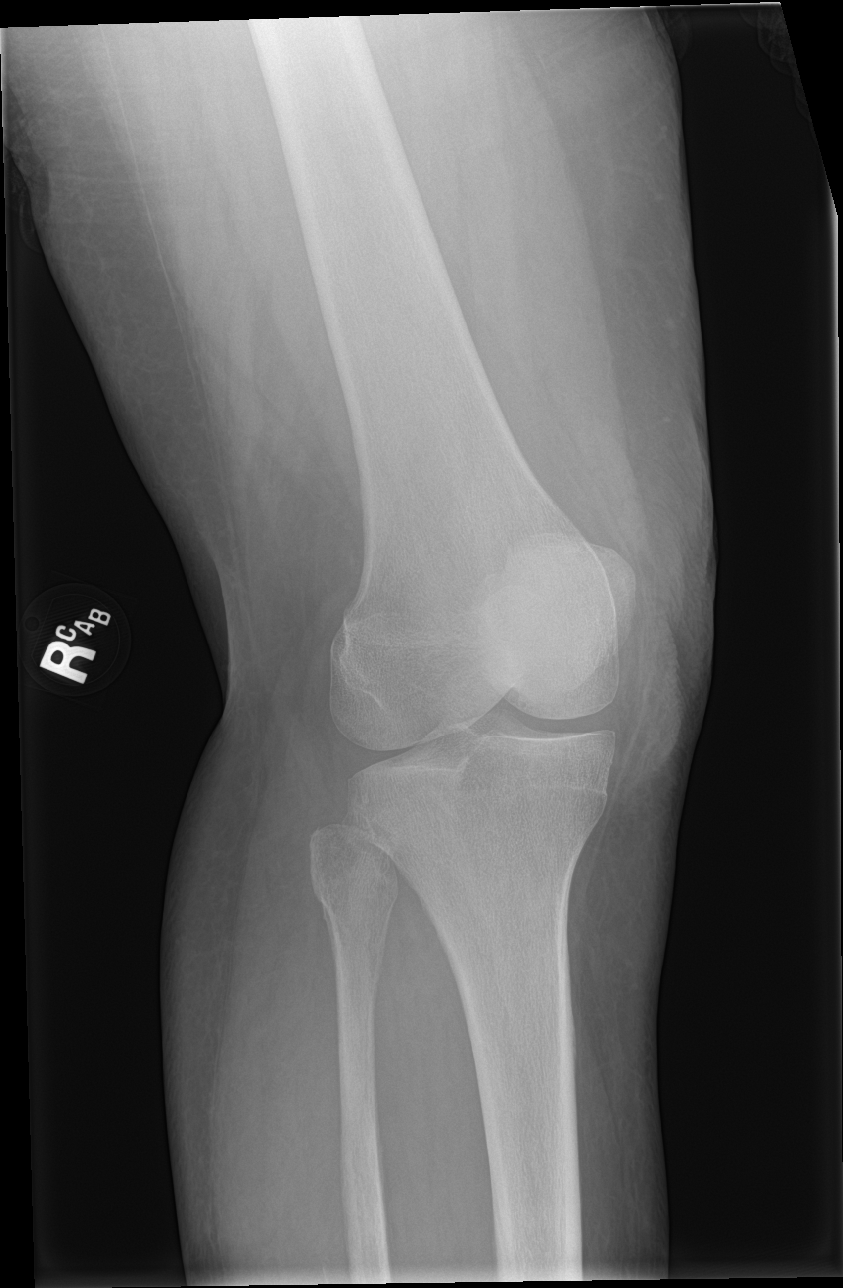

[knee obl (2 of 2)]
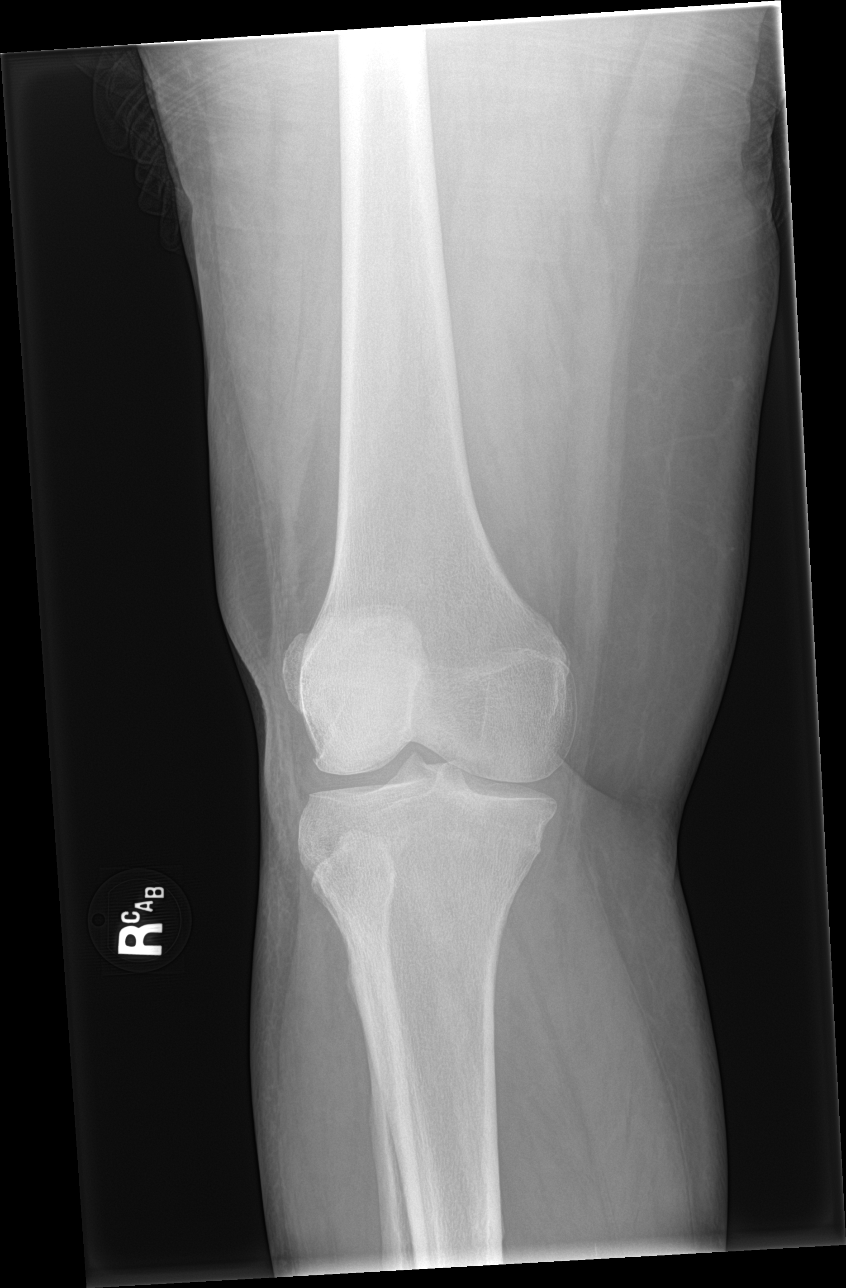

[4 of 4 positions shown; findings below may reference images not displayed]

FINDINGS: No evidence of fracture, dislocation, or joint effusion. No evidence
of arthropathy or other focal bone abnormality. Soft tissues are
unremarkable.
IMPRESSION: Negative.

## 2019-10-22 ENCOUNTER — Telehealth: Payer: Self-pay

## 2019-10-22 NOTE — Telephone Encounter (Signed)
Sounds good I will see her tomorrow morning

## 2019-10-22 NOTE — Telephone Encounter (Signed)
[place] Night - Client TELEPHONE ADVICE RECORD [facility] Patient Name: Angela Bentley Gender: Female DOB: October 03, 1940 Age: 79 Y 2 M 11 D Return Phone Number: [phone] (Primary), [phone] (Secondary) Address: City/State/Zip: [place] [address] Client [facility] Night - Client Client Site [place] Physician Viviana Simpler - MD Contact Type Call Who Is Calling Patient / Member / Family / Caregiver Call Type Triage / Clinical Relationship To Patient Self Return Phone Number [phone] (Secondary) Chief Complaint BLOOD PRESSURE HIGH - Systolic (top number) [id number] or greater (with symptoms) Reason for Call Symptomatic / Request for Health Information Initial Comment Caller states that she is having high blood pressure. It is 200/100. Translation No Nurse Assessment Nurse: Jac Canavan, RN, Estill Bamberg Date/Time (Eastern Time): 10/22/2019 2:55:17 AM Confirm and document reason for call. If symptomatic, describe symptoms. ---Caller states her BP was 200/100 about 30 minutes ago. It is currently 195/102. She takes Losartan but has not taken her meds until 45 minutes ago. She usually takes it at noon but forgot it today. Has the patient had close contact with a person known or suspected to have the novel coronavirus illness OR traveled / lives in area with major community spread (including international travel) in the last 14 days from the onset of symptoms? * If Asymptomatic, screen for exposure and travel within the last 14 days. ---No Does the patient have any new or worsening symptoms? ---Yes Will a triage be completed? ---Yes Related visit to physician within the last 2 weeks? ---No Does the PT have any chronic conditions? (i.e. diabetes, asthma, this includes High risk factors for pregnancy, etc.) ---Yes List chronic conditions. ---HTN, diabetes Is this a behavioral health or substance abuse  call? ---No Nurse: Jac Canavan, RN, Estill Bamberg Date/Time (Eastern Time): 10/22/2019 3:48:21 AM Confirm and document reason for call. If symptomatic, describe symptoms. ---Return call regarding high BP and missed dose of Losartan. Current BP is 194/94 Has the patient had close contact with a person known or suspected to have the novel coronavirus illness OR traveled / lives in area with major community spread (including international travel) in the last 14 days from the onset of ---No PLEASE NOTE: All timestamps contained within this report are represented as [place] Standard Time. CONFIDENTIALTY NOTICE: This fax transmission is intended only for the addressee. It contains information that is legally privileged, confidential or otherwise protected from use or disclosure. If you are not the intended recipient, you are strictly prohibited from reviewing, disclosing, copying using or disseminating any of this information or taking any action in reliance on or regarding this information. If you have received this fax in error, please notify us immediately by telephone so that we can arrange for its return to [place]. Phone: [phone], Toll-Free: [phone], Fax: [phone] Page: 2 of 3 Call Id: [id number] Nurse Assessment symptoms? * If Asymptomatic, screen for exposure and travel within the last 14 days. Does the patient have any new or worsening symptoms? ---Yes Will a triage be completed? ---Yes Related visit to physician within the last 2 weeks? ---No Does the PT have any chronic conditions? (i.e. diabetes, asthma, this includes High risk factors for pregnancy, etc.) ---Unknown Is this a behavioral health or substance abuse call? ---No Guidelines Guideline Title Affirmed Question Affirmed Notes Nurse Date/Time (Eastern Time) High Blood Pressure [id number] Systolic BP >= 99991111 OR Diastolic >= A999333 AND [id number] missed most recent dose of blood pressure medication Jac Canavan, RN, Estill Bamberg 10/22/2019 3:00:16 AM High  Blood Pressure Systolic BP >= 99991111 OR Diastolic >= [id number] Kees, RN, Estill Bamberg 10/22/2019 3:49:38 AM Disp. Time Eilene Ghazi Time) Disposition Final User 10/22/2019 2:49:53 AM Send to Urgent [facility] 10/22/2019 3:05:44 AM Urgent Home Treatment with Follow-Up Call [place], RNEstill Bamberg 10/22/2019 3:06:29 AM Attempt made - message left Maryellen Pile 10/22/2019 3:54:59 AM See PCP within 24 Hours Yes Jac Canavan, RN, Shelly Coss Disagree/Comply Comply Caller Understands Yes PreDisposition Home Care Care Advice Given Per Guideline MISSED DOSE OF BLOOD PRESSURE MEDICATION: * Generally, you should take a missed dose as soon as you remember. * If it is more than 8 hours until your next dose, take the missed dose of medication now. URGENT HOME TREATMENT WITH FOLLOW-UP CALL: * CALL CENTER PROVIDES RN CALL-BACKS: You should usually improve with the home treatment advice I give you. I'll call you back in 30-60 minutes to see how you are doing. Call me back immediately if: you become worse before my follow-up call. * You become worse. * Chest pain or difficulty breathing occurs * Difficulty walking, difficulty talking, or severe headache occurs * Weakness or numbness of the face, arm or leg on one side of the body occurs CALL BACK IF: CARE ADVICE given per High Blood Pressure (Adult) guideline. SEE PCP WITHIN 24 HOURS: * IF OFFICE WILL BE OPEN: You need to be seen within the next 24 hours. Call your doctor (or NP/PA) when the office opens and make an appointment. CARE ADVICE given per High Blood Pressure (Adult) guideline. * You become worse. * Chest pain or difficulty breathing occurs * Difficulty walking, difficulty talking, or severe headache occurs * Weakness or numbness of the face, arm or leg on one side of the body occurs CALL BACK IF: PLEASE NOTE: All timestamps contained within this report are represented as [place] Standard Time. CONFIDENTIALTY NOTICE: This fax transmission is intended only for the  addressee. It contains information that is legally privileged, confidential or otherwise protected from use or disclosure. If you are not the intended recipient, you are strictly prohibited from reviewing, disclosing, copying using or disseminating any of this information or taking any action in reliance on or regarding this information. If you have received this fax in error, please notify us immediately by telephone so that we can arrange for its return to [place]. Phone: [phone], Toll-Free: [phone], Fax: [phone] Page: 3 of 3 Call Id: [id number] Referrals REFERRED TO PCP OFFICE

## 2019-10-22 NOTE — Telephone Encounter (Signed)
I spoke with pt; pt has taken BP this morning BP 130/80. Pt said she already has appt to see Dr [name] on 10/23/19 at 9 AM about chest wall pain that pt h as been pt messaging Dr [name] about. Pt will cb if needed prior to appt. UC & ED precautions given and pt voiced understanding. Pt said if problems after hrs will cb and speak to access nurse; pt was pleased with advice last night. FYI to Dr [name].

## 2019-10-23 ENCOUNTER — Encounter: Payer: Self-pay | Admitting: Internal Medicine

## 2019-10-23 ENCOUNTER — Other Ambulatory Visit: Payer: Self-pay

## 2019-10-23 ENCOUNTER — Ambulatory Visit (INDEPENDENT_AMBULATORY_CARE_PROVIDER_SITE_OTHER)
Admission: RE | Admit: 2019-10-23 | Discharge: 2019-10-23 | Disposition: A | Discharge status: Home / Self Care | Payer: Medicare Other | Source: Ambulatory Visit | Attending: Internal Medicine | Admitting: Internal Medicine

## 2019-10-23 ENCOUNTER — Ambulatory Visit (INDEPENDENT_AMBULATORY_CARE_PROVIDER_SITE_OTHER): Payer: Medicare Other | Admitting: Internal Medicine

## 2019-10-23 DIAGNOSIS — R1012 Left upper quadrant pain: Secondary | ICD-10-CM | POA: Diagnosis not present

## 2019-10-23 DIAGNOSIS — R079 Chest pain, unspecified: Secondary | ICD-10-CM | POA: Insufficient documentation

## 2019-10-23 DIAGNOSIS — R0781 Pleurodynia: Secondary | ICD-10-CM | POA: Diagnosis not present

## 2019-10-23 DIAGNOSIS — R1011 Right upper quadrant pain: Secondary | ICD-10-CM | POA: Diagnosis not present

## 2019-10-23 NOTE — Progress Notes (Signed)
Subjective:    Patient ID: Angela Bentley, female    DOB: Jun 17, 1941, 79 y.o.   MRN: [id number]  HPI Here due to lower chest pain This visit occurred during the SARS-CoV-2 public health emergency.  Safety protocols were in place, including screening questions prior to the visit, additional usage of staff PPE, and extensive cleaning of exam room while observing appropriate contact time as indicated for disinfecting solutions.   Has pain along lower rib line--across right and left Comes as a catch Feels like "some fat moving up from abdomen"--feels fuller Worse in the car More noticeable on left side Started 4 days ago---no falls, no new tasks, etc Gets sensation of "grabbing" --but also notices it with twisting  Brief sensations--no problems if still Can occasionally feel it with a big breath  Current Outpatient Medications on File Prior to Visit  Medication Sig Dispense Refill  . celecoxib (CELEBREX) 200 MG capsule TAKE 1 CAPSULE BY MOUTH ONCE DAILY AS NEEDED FOR MILD TO MODERATE PAIN 30 capsule 5  . diclofenac sodium (VOLTAREN) 1 % GEL Apply 4 g topically 4 (four) times daily. (Patient taking differently: Apply 4 g topically 4 (four) times daily as needed. ) 100 g 2  . fluticasone (VERAMYST) 27.5 MCG/SPRAY nasal spray Place 2 sprays into the nose daily as needed for allergies.     . furosemide (LASIX) 20 MG tablet Take 1 tablet (20 mg total) by mouth daily as needed. 30 tablet 3  . glimepiride (AMARYL) 1 MG tablet TAKE 1 TABLET EVERY DAY WITH BREAKFAST 90 tablet 3  . losartan-hydrochlorothiazide (HYZAAR) 100-12.5 MG tablet TAKE 1 TABLET EVERY DAY 90 tablet 3  . metoprolol succinate (TOPROL-XL) 25 MG 24 hr tablet TAKE 1 TABLET (25 MG TOTAL) BY MOUTH DAILY. 90 tablet 3  . mirabegron ER (MYRBETRIQ) 25 MG TB24 tablet Take 1 tablet (25 mg total) by mouth daily. One po qd (Patient taking differently: Take 25 mg by mouth daily as needed. One po qd) 30 tablet 2  . Multiple Vitamin  (MULTIVITAMIN) tablet Take 1 tablet by mouth daily.    Marland Kitchen nystatin cream (MYCOSTATIN) Apply 1 application topically 2 (two) times daily. Apply to affected area BID for up to 7 days. (Patient taking differently: Apply 1 application topically 2 (two) times daily. Apply to affected area BID PRN) 30 g 1  . pioglitazone (ACTOS) 45 MG tablet TAKE 1 TABLET EVERY DAY 90 tablet 3  . potassium chloride SA (K-DUR,KLOR-CON) 20 MEQ tablet Take 1 tablet (20 mEq total) by mouth daily as needed. 30 tablet 3  . XARELTO 20 MG TABS tablet TAKE 1 TABLET (20 MG TOTAL) BY MOUTH DAILY WITH SUPPER. 90 tablet 3   No current facility-administered medications on file prior to visit.    Allergies  Allergen Reactions  . Penicillins     Has patient had a PCN reaction causing immediate rash, facial/tongue/throat swelling, SOB or lightheadedness with hypotension: Yes Has patient had a PCN reaction causing severe rash involving mucus membranes or skin necrosis: Yes Has patient had a PCN reaction that required hospitalization No Has patient had a PCN reaction occurring within the last 10 years: No If all of the above answers are "NO", then may proceed with Cephalosporin use.    . Sulfa Antibiotics     Rash/itching  . Azithromycin Palpitations    Past Medical History:  Diagnosis Date  . Balance problems   . Brain tumor ([place])    a. Left intraventricular tumor ->stable by 06/2017  MRI. Followed @ [facility].  . Gait disturbance    a. Unsteady on feet/balance difficulty.  . Generalized osteoarthritis of multiple sites   . GERD (gastroesophageal reflux disease)   . History of DVT (deep vein thrombosis) 2016   estrogen and airflights. Rx with xarelto for 3 months  . History of stress test    a. 11/2016 ETT: No ST/T changes.  Performed to assess PAC/PVC burden with activity ->No ectopy noted.  . Hypertension   . Hyperthyroidism    treated briefly in college  . Insomnia   . Obesity   . PAF (paroxysmal atrial fibrillation)  ([place])    a. 12/2016 Event Monitor: brief run of PAF-->CHA2DS2VASc = 5--> Xarelto.  . Plantar fasciitis   . Retinal tear of left eye   . Rosacea   . Type 2 diabetes, controlled, with peripheral neuropathy ([place])    a. 11/2016 A1c 7.4.  . Uterine fibroid     Past Surgical History:  Procedure Laterality Date  . CARPAL TUNNEL RELEASE Right    Dr Burney Gauze  . CATARACT EXTRACTION    . RETINAL TEAR REPAIR CRYOTHERAPY     10/09/13, then again 3/15    Family History  Problem Relation Age of Onset  . Diabetes Father   . Pancreatic cancer Father   . Diabetes Other        grandmother    Social History   Socioeconomic History  . Marital status: Married    Spouse name: Not on file  . Number of children: 3  . Years of education: Not on file  . Highest education level: Not on file  Occupational History  . Occupation: Non Patent attorney    Comment: [employer] and others  Tobacco Use  . Smoking status: Former Smoker    Quit date: 09/26/1993    Years since quitting: 26.0  . Smokeless tobacco: Never Used  . Tobacco comment: quit in 1996  Substance and Sexual Activity  . Alcohol use: Yes    Alcohol/week: 0.0 - 1.0 standard drinks  . Drug use: No  . Sexual activity: Not Currently    Partners: Male  Other Topics Concern  . Not on file  Social History Narrative   Has living will   Husband is health care POA-- or daughter Izora Gala   Would accept resuscitation attempts   Would not want tube feeds if cognitively unaware   Social Determinants of Health   Financial Resource Strain:   . Difficulty of Paying Living Expenses: Not on file  Food Insecurity:   . Worried About Charity fundraiser in the Last Year: Not on file  . Ran Out of Food in the Last Year: Not on file  Transportation Needs:   . Lack of Transportation (Medical): Not on file  . Lack of Transportation (Non-Medical): Not on file  Physical Activity:   . Days of Exercise per Week: Not on file  . Minutes of Exercise per Session:  Not on file  Stress:   . Feeling of Stress : Not on file  Social Connections:   . Frequency of Communication with Friends and Family: Not on file  . Frequency of Social Gatherings with Friends and Family: Not on file  . Attends Religious Services: Not on file  . Active Member of Clubs or Organizations: Not on file  . Attends Archivist Meetings: Not on file  . Marital Status: Not on file  Intimate Partner Violence:   . Fear of Current or Ex-Partner: Not  on file  . Emotionally Abused: Not on file  . Physically Abused: Not on file  . Sexually Abused: Not on file   Review of Systems Trouble turning in bed No cough, fever or respiratory symptoms (sllight tickle cough) No relationship to eating Appetite is okay No N/V Some burping after vitamin BP went up yesterday after missing losartan No recent celebrex Still with constipation--but better with regular miralax    Objective:   Physical Exam  Constitutional: She appears well-developed. No distress.  Neck: No thyromegaly present.  Cardiovascular: Normal rate, regular rhythm and normal heart sounds. Exam reveals no gallop and no friction rub.  No murmur heard. Respiratory: Effort normal and breath sounds normal. No respiratory distress. She has no wheezes. She has no rales.  Slight pain with pressure along lower left ribs--no posterior or lateral rib tenderness  GI: Soft. Bowel sounds are normal. She exhibits no distension. There is no abdominal tenderness. There is no rebound and no guarding.  Musculoskeletal:        General: No edema.  Lymphadenopathy:    She has no cervical adenopathy.  Psychiatric: She has a normal mood and affect. Her behavior is normal.           Assessment & Plan:

## 2019-10-23 NOTE — Assessment & Plan Note (Addendum)
Goes over to the right as well Seems to be with movement and short bursts No respiratory symptoms of note but need to consider pleural disease---will check CXR Likely just soft tissue though she feels it is nerve Small chance this could be bowel related--if obstipated. Will check KUB as well Doubt reflux or ulcer---not on NSAID now Recent labs were fine--and eating well, etc Symptoms not consistent with recurrent PE  KUB and CXR both look normal--will await overread Reassured---will observe only

## 2019-11-05 DIAGNOSIS — Z23 Encounter for immunization: Secondary | ICD-10-CM | POA: Diagnosis not present

## 2019-11-15 ENCOUNTER — Ambulatory Visit: Payer: Medicare Other | Admitting: Orthopedic Surgery

## 2019-11-18 ENCOUNTER — Encounter: Payer: Self-pay | Admitting: Physical Therapy

## 2019-11-21 ENCOUNTER — Telehealth: Payer: Self-pay

## 2019-11-21 ENCOUNTER — Ambulatory Visit (INDEPENDENT_AMBULATORY_CARE_PROVIDER_SITE_OTHER): Payer: Medicare Other | Admitting: Orthopedic Surgery

## 2019-11-21 ENCOUNTER — Other Ambulatory Visit: Payer: Self-pay

## 2019-11-21 DIAGNOSIS — S838X1A Sprain of other specified parts of right knee, initial encounter: Secondary | ICD-10-CM

## 2019-11-21 NOTE — Telephone Encounter (Signed)
Can we get patient approved for gel injection of right knee.

## 2019-11-21 NOTE — Telephone Encounter (Signed)
Submitted for VOB for Synvisc one -right knee

## 2019-11-22 ENCOUNTER — Encounter: Payer: Self-pay | Admitting: Orthopedic Surgery

## 2019-11-22 ENCOUNTER — Telehealth: Payer: Self-pay

## 2019-11-22 DIAGNOSIS — S838X1A Sprain of other specified parts of right knee, initial encounter: Secondary | ICD-10-CM | POA: Diagnosis not present

## 2019-11-22 NOTE — Telephone Encounter (Signed)
Called and scheduled pt for 12/02/19

## 2019-11-22 NOTE — Telephone Encounter (Signed)
Approved for Synvisc one- Right knee Dr. Latanya Presser and Bill No copay No OOP No prior auth required

## 2019-11-22 NOTE — Progress Notes (Signed)
Office Visit Note   Patient: Angela Bentley           Date of Birth: Oct 19, 1940           MRN: [id number] Visit Date: 11/21/2019 Requested by: [name], MD [place],  [place] [address] PCP: [name], MD  Subjective: Chief Complaint  Patient presents with  . Right Knee - Pain    HPI: Angela Bentley is an 79 year old female with right knee pain.  She has a known meniscal tear.  She is active and 79 years old but generally healthy.  She had a cortisone shot a month ago which gave her about a week of relief.  She has mild degenerative changes but the most significant finding is that meniscal tear in the knee.  She wants to avoid surgery.  Not having much in the way of mechanical symptoms.              ROS: All systems reviewed are negative as they relate to the chief complaint within the history of present illness.  Patient denies  fevers or chills.   Assessment & Plan: Visit Diagnoses: No diagnosis found.  Plan: Impression is recurrent small effusion in a patient who has mild degenerative changes in the knee and medial meniscal tear.  She wants to avoid surgery.  Essentially after a long exhaustive complete and thorough discussion of all treatment options and potential outcomes decision was made to proceed with gel injection in about 3 weeks.  Aspiration performed today.  Follow-up in 3 weeks for gel injection. This patient is diagnosed with osteoarthritis of the knee(s).    Radiographs show evidence of joint space narrowing, osteophytes, subchondral sclerosis and/or subchondral cysts.  This patient has knee pain which interferes with functional and activities of daily living.    This patient has experienced inadequate response, adverse effects and/or intolerance with conservative treatments such as acetaminophen, NSAIDS, topical creams, physical therapy or regular exercise, knee bracing and/or weight loss.   This patient has experienced inadequate response  or has a contraindication to intra articular steroid injections for at least 3 months.   This patient is not scheduled to have a total knee replacement within 6 months of starting treatment with viscosupplementation.   Follow-Up Instructions: Return if symptoms worsen or fail to improve.   Orders:  No orders of the defined types were placed in this encounter.  No orders of the defined types were placed in this encounter.     Procedures: Large Joint Inj: R knee on 11/22/2019 10:12 PM Indications: diagnostic evaluation, joint swelling and pain Details: 18 G 1.5 in needle, superolateral approach  Arthrogram: No  Medications: 5 mL lidocaine 1 % Aspirate: 15 mL serous Outcome: tolerated well, no immediate complications Procedure, treatment alternatives, risks and benefits explained, specific risks discussed. Consent was given by the patient. Immediately prior to procedure a time out was called to verify the correct patient, procedure, equipment, support staff and site/side marked as required. Patient was prepped and draped in the usual sterile fashion.       Clinical Data: No additional findings.  Objective: Vital Signs: LMP 09/26/1997   Physical Exam:   Constitutional: Patient appears well-developed HEENT:  Head: Normocephalic Eyes:EOM are normal Neck: Normal range of motion Cardiovascular: Normal rate Pulmonary/chest: Effort normal Neurologic: Patient is alert Skin: Skin is warm Psychiatric: Patient has normal mood and affect    Ortho Exam: Ortho exam demonstrates normal gait alignment.  Trace effusion right  knee.  Mild medial joint line tenderness.  Stable collateral and cruciate ligaments.  No masses lymphadenopathy or skin changes noted in that right knee region.  Extensor mechanism is intact.  Specialty Comments:  No specialty comments available.  Imaging: No results found.   PMFS History: Patient Active Problem List   Diagnosis Date Noted  . Left-sided  chest pain 10/23/2019  . Overactive bladder 10/09/2019  . Lumbar sprain 03/28/2019  . Tinnitus, bilateral 11/28/2017  . Abnormal MRI of head 11/14/2017  . Meningioma ([place]) 09/21/2017  . PAF (paroxysmal atrial fibrillation) ([place]) 02/03/2017  . Preventative health care 06/02/2016  . Advance directive discussed with patient 06/02/2016  . Achilles tendonitis 03/11/2016  . Hypertension   . DVT, lower extremity, recurrent ([place])   . Type 2 diabetes, controlled, with peripheral neuropathy ([place])   . Generalized osteoarthritis of multiple sites   . GERD (gastroesophageal reflux disease)   . Hyperthyroidism   . Retinal hole of right eye 06/26/2014  . Amblyopia, right 05/08/2014  . Status post intraocular lens implant 05/08/2014   Past Medical History:  Diagnosis Date  . Balance problems   . Brain tumor ([place])    a. Left intraventricular tumor ->stable by 06/2017 MRI. Followed @ [facility].  . Gait disturbance    a. Unsteady on feet/balance difficulty.  . Generalized osteoarthritis of multiple sites   . GERD (gastroesophageal reflux disease)   . History of DVT (deep vein thrombosis) 2016   estrogen and airflights. Rx with xarelto for 3 months  . History of stress test    a. 11/2016 ETT: No ST/T changes.  Performed to assess PAC/PVC burden with activity ->No ectopy noted.  . Hypertension   . Hyperthyroidism    treated briefly in college  . Insomnia   . Obesity   . PAF (paroxysmal atrial fibrillation) ([place])    a. 12/2016 Event Monitor: brief run of PAF-->CHA2DS2VASc = 5--> Xarelto.  . Plantar fasciitis   . Retinal tear of left eye   . Rosacea   . Type 2 diabetes, controlled, with peripheral neuropathy ([place])    a. 11/2016 A1c 7.4.  . Uterine fibroid     Family History  Problem Relation Age of Onset  . Diabetes Father   . Pancreatic cancer Father   . Diabetes Other        grandmother    Past Surgical History:  Procedure Laterality Date  . CARPAL TUNNEL RELEASE Right    Dr Burney Gauze  .  CATARACT EXTRACTION    . RETINAL TEAR REPAIR CRYOTHERAPY     10/09/13, then again 3/15   Social History   Occupational History  . Occupation: Non Patent attorney    Comment: [employer] and others  Tobacco Use  . Smoking status: Former Smoker    Quit date: 09/26/1993    Years since quitting: 26.1  . Smokeless tobacco: Never Used  . Tobacco comment: quit in 1996  Substance and Sexual Activity  . Alcohol use: Yes    Alcohol/week: 0.0 - 1.0 standard drinks  . Drug use: No  . Sexual activity: Not Currently    Partners: Male

## 2019-11-23 MED ORDER — LIDOCAINE HCL 1 % IJ SOLN
5.0000 mL | INTRAMUSCULAR | Status: AC | PRN
  Administered 2019-11-22: 5 mL

## 2019-12-02 ENCOUNTER — Other Ambulatory Visit: Payer: Self-pay

## 2019-12-02 ENCOUNTER — Ambulatory Visit (INDEPENDENT_AMBULATORY_CARE_PROVIDER_SITE_OTHER): Payer: Medicare Other | Admitting: Orthopedic Surgery

## 2019-12-02 ENCOUNTER — Encounter: Payer: Self-pay | Admitting: Orthopedic Surgery

## 2019-12-02 DIAGNOSIS — S838X1A Sprain of other specified parts of right knee, initial encounter: Secondary | ICD-10-CM

## 2019-12-06 DIAGNOSIS — M1711 Unilateral primary osteoarthritis, right knee: Secondary | ICD-10-CM

## 2019-12-06 MED ORDER — HYLAN G-F 20 48 MG/6ML IX SOSY
48.0000 mg | PREFILLED_SYRINGE | INTRA_ARTICULAR | Status: AC | PRN
  Administered 2019-12-06: 48 mg via INTRA_ARTICULAR

## 2019-12-06 MED ORDER — LIDOCAINE HCL 1 % IJ SOLN
5.0000 mL | INTRAMUSCULAR | Status: AC | PRN
  Administered 2019-12-06: 16:00:00 5 mL

## 2019-12-06 NOTE — Progress Notes (Signed)
   Procedure Note  Patient: Angela Bentley             Date of Birth: 25-Nov-1940           MRN: [id number]             Visit Date: 12/02/2019  Procedures: Visit Diagnoses: No diagnosis found.  Large Joint Inj: R knee on 12/06/2019 4:14 PM Indications: diagnostic evaluation, joint swelling and pain Details: 18 G 1.5 in needle, superolateral approach  Arthrogram: No  Medications: 5 mL lidocaine 1 %; 48 mg Hylan 48 MG/6ML Outcome: tolerated well, no immediate complications Procedure, treatment alternatives, risks and benefits explained, specific risks discussed. Consent was given by the patient. Immediately prior to procedure a time out was called to verify the correct patient, procedure, equipment, support staff and site/side marked as required. Patient was prepped and draped in the usual sterile fashion.

## 2019-12-16 ENCOUNTER — Encounter: Payer: Self-pay | Admitting: Orthopedic Surgery

## 2019-12-16 DIAGNOSIS — S838X1A Sprain of other specified parts of right knee, initial encounter: Secondary | ICD-10-CM

## 2019-12-16 NOTE — Telephone Encounter (Signed)
Okay to refer to physical therapy 3 times a week for 4 weeks for the issues she is having thanks

## 2019-12-23 ENCOUNTER — Ambulatory Visit: Payer: Medicare Other | Attending: Orthopedic Surgery | Admitting: Physical Therapy

## 2019-12-23 ENCOUNTER — Other Ambulatory Visit: Payer: Self-pay

## 2019-12-23 ENCOUNTER — Encounter: Payer: Self-pay | Admitting: Physical Therapy

## 2019-12-23 DIAGNOSIS — M25561 Pain in right knee: Secondary | ICD-10-CM | POA: Diagnosis not present

## 2019-12-23 DIAGNOSIS — R262 Difficulty in walking, not elsewhere classified: Secondary | ICD-10-CM

## 2019-12-23 DIAGNOSIS — M6281 Muscle weakness (generalized): Secondary | ICD-10-CM

## 2019-12-23 DIAGNOSIS — R2681 Unsteadiness on feet: Secondary | ICD-10-CM

## 2019-12-25 ENCOUNTER — Encounter: Payer: Medicare Other | Admitting: Physical Therapy

## 2019-12-25 NOTE — Therapy (Signed)
[place] MAIN [facility] SERVICES [address] [place], [place], [address] Phone: [phone]   Fax:  [phone]  Physical Therapy Evaluation  Patient Details  Name: Angela Bentley MRN: [id number] Date of Birth: 1940/11/16 Referring Provider (PT): Meredith Pel, MD   Encounter Date: 12/23/2019  PT End of Session - 12/25/19 0808    Visit Number  1    Number of Visits  17    Date for PT Re-Evaluation  02/19/20    PT Start Time  1102    PT Stop Time  1147    PT Time Calculation (min)  45 min    Activity Tolerance  Patient tolerated treatment well    Behavior During Therapy  [facility] for tasks assessed/performed       Past Medical History:  Diagnosis Date  . Balance problems   . Brain tumor ([place])    a. Left intraventricular tumor ->stable by 06/2017 MRI. Followed @ [facility].  . Gait disturbance    a. Unsteady on feet/balance difficulty.  . Generalized osteoarthritis of multiple sites   . GERD (gastroesophageal reflux disease)   . History of DVT (deep vein thrombosis) 2016   estrogen and airflights. Rx with xarelto for 3 months  . History of stress test    a. 11/2016 ETT: No ST/T changes.  Performed to assess PAC/PVC burden with activity ->No ectopy noted.  . Hypertension   . Hyperthyroidism    treated briefly in college  . Insomnia   . Obesity   . PAF (paroxysmal atrial fibrillation) ([place])    a. 12/2016 Event Monitor: brief run of PAF-->CHA2DS2VASc = 5--> Xarelto.  . Plantar fasciitis   . Retinal tear of left eye   . Rosacea   . Type 2 diabetes, controlled, with peripheral neuropathy ([place])    a. 11/2016 A1c 7.4.  . Uterine fibroid     Past Surgical History:  Procedure Laterality Date  . CARPAL TUNNEL RELEASE Right    Dr Burney Gauze  . CATARACT EXTRACTION    . RETINAL TEAR REPAIR CRYOTHERAPY     10/09/13, then again 3/15    There were no vitals filed for this visit.   Subjective Assessment - 12/25/19 0819    Subjective  "I am  just not sleeping well because of the pain."    Pertinent History  79 yo Female reports increased right knee pain since December 2020. She was found to have right meniscus tear. She is hoping to treat conservatively and avoid surgery; She did get cortisone injection in January, with no significant relief. She did get gel injection approximately 4 weeks ago and reports just now feeling relief from that. She reports increased RLE lateral thigh pain. She reports pain does prevent sleeping with patient having trouble getting in a comfortable position. In addition she also reports increased posterior LLE knee discomfort either from piriformis syndrome or possible cyst in LLE knee. She presents to therapy without AD but does report decreased activity level due to fatigue from not sleeping well. She reports only sleeping for 3-4 hours at the most and then will have to get up and move around. She has been walking the dog for short walks. She admits she has not been exercising much and reports feeling out of shape. PMH: significant for DMx2 (controlled), HTN (controlled), chronic dizziness/impaired vision in left eye, imbalance, A-fib (on blood thinner),    How long can you sit comfortably?  variable; 10 min or 1 hour  How long can you stand comfortably?  Short time    How long can you walk comfortably?  0.25 miles    Diagnostic tests  MRI in March 2020 shows Horizontal tear anterior body of the medial meniscus    Patient Stated Goals  "Improving standing position, reduce knee valgus position, reduce pain especially with improving sleep, increase endurance    Currently in Pain?  Yes    Pain Score  1     Pain Location  Knee    Pain Orientation  Right    Pain Descriptors / Indicators  Aching;Sore    Pain Type  Chronic pain    Pain Onset  More than a month ago    Pain Frequency  Intermittent    Aggravating Factors   tender to touch, worse with sleeping    Pain Relieving Factors  some relief from gel injection,  temporary relief from voltaren gel    Effect of Pain on Daily Activities  decreased activity tolerance, decreased sleeping tolerance;    Multiple Pain Sites  Yes    Pain Score  3    Pain Location  Knee    Pain Orientation  Left;Posterior    Pain Descriptors / Indicators  Tender;Throbbing    Pain Type  Acute pain    Pain Onset  1 to 4 weeks ago    Pain Frequency  Intermittent    Aggravating Factors   worse at night, tender to touch    Pain Relieving Factors  repositioning    Effect of Pain on Daily Activities  decreased activity tolerance;         [facility] PT Assessment - 12/25/19 0001      Assessment   Medical Diagnosis  right knee pain    Referring Provider (PT)  Meredith Pel, MD    Onset Date/Surgical Date  08/27/19    Hand Dominance  Right    Next MD Visit  none scheduled    Prior Therapy  had PT for various issues, none recent for knee pain      Precautions   Precautions  Fall    Required Braces or Orthoses  --   has knee brace RLE- wears intermittently     Restrictions   Weight Bearing Restrictions  No      Balance Screen   Has the patient fallen in the past 6 months  No    Has the patient had a decrease in activity level because of a fear of falling?   No    Is the patient reluctant to leave their home because of a fear of falling?   No      Home Environment   Additional Comments  lives in single story independent living community with husband, mod I for self care ADLs      Prior Function   Level of Independence  Independent    Vocation  Retired    Leisure  likes to walk the dog, socialize with friends      Cognition   Overall Cognitive Status  Within Functional Limits for tasks assessed      Observation/Other Assessments   Focus on Therapeutic Outcomes (FOTO)   51/100      Sensation   Light Touch  Appears Intact    Proprioception  Appears Intact      Coordination   Gross Motor Movements are Fluid and Coordinated  Yes    Fine Motor Movements are  Fluid and Coordinated  Yes  Posture/Postural Control   Posture Comments  In standing, patient exhibits wider base of support, increased weight shift to LLE, RLE externally rotated with increased valgus position of RLE knee; Increased flexion of right knee      AROM   Right Knee Extension  5    Right Knee Flexion  110    Left Knee Extension  0    Left Knee Flexion  123      Palpation   Patella mobility  increased tenderness on lateral right patella especially with medial glides; good mobility inferior/superiorly without pain;       Special Tests    Special Tests  Knee Special Tests      Transfers   Comments  requires HHA to push up from chair      Ambulation/Gait   Gait Comments  ambulates with RLE foot externally rotated, increased supination at heel strike with heavy pronation during mid stance, increased RLE knee valgus, flexed knee in mid stance, mildly antalgic gait with short stance time on RLE, decreased step length LLE; normal base of support      Standardized Balance Assessment   10 Meter Walk  0.75 m/s      High Level Balance   High Level Balance Comments  static standing balance is fair, dynamic standing balance is fair                Objective measurements completed on examination: See above findings.              PT Education - 12/25/19 0807    Education Details  Plan of care, recommendations    Person(s) Educated  Patient    Methods  Explanation    Comprehension  Verbalized understanding       PT Short Term Goals - 12/25/19 0816      PT SHORT TERM GOAL #1   Title  Patient will be adherent to HEP at least 3x a week to improve functional strength and balance for better safety at home.    Time  4    Period  Weeks    Status  New    Target Date  01/22/20      PT SHORT TERM GOAL #2   Title  Patient will increase RLE knee AROM extension to within 5 degrees of neutral for better standing and walking tolerance;     Time  4    Period   Weeks    Status  New    Target Date  01/22/20      PT SHORT TERM GOAL #3   Title  Patient will reports maximum of 1 sleep disturbances per night over last 3 days to exhibit improved sleeping tolerance with less knee pain and reduced fatigue.     Time  4    Period  Weeks    Status  New    Target Date  01/22/20        PT Long Term Goals - 12/25/19 0817      PT LONG TERM GOAL #1   Title  Patient will increase BLE gross strength to 4+/5 as to improve functional strength for independent gait, increased standing tolerance and increased ADL ability.    Time  8    Period  Weeks    Status  New    Target Date  02/19/20      PT LONG TERM GOAL #2   Title  Patient will improve FOTO score to >55/100 to indicate improved functional mobility  with ADLs.    Time  8    Period  Weeks    Status  New    Target Date  02/19/20      PT LONG TERM GOAL #3   Title  Patient will report a worst pain of 3/10 on VAS in      right knee       to improve tolerance with ADLs and reduced symptoms with activities.     Time  8    Period  Weeks    Status  New    Target Date  02/19/20      PT LONG TERM GOAL #4   Title  Patient will complete >1000 feet on 6 min walk test without increase in knee pain to improve community ambulator distance for functional tasks and return to PLOF.    Time  8    Period  Weeks    Status  New    Target Date  02/19/20      PT LONG TERM GOAL #5   Title  Patient (> 22 years old) will complete five times sit to stand test in < 15 seconds indicating an increased LE strength and improved balance.    Time  8    Period  Weeks    Status  New    Target Date  02/19/20             Plan - 12/25/19 0808    Clinical Impression Statement  79 yo Female presents to therapy with increased RLE knee pain as a result of previous meniscus tear. Patient has a history of chronic knee pain from past issues including RLE calcaneus inversion. She has recieved custom orthotics which have not made  significant difference. She is reporting increased RLE lateral thigh pain along IT band. In standing, patient exhibits wide base of support, weight shift to LLE with RLE externally rotated, right foot turned out, with RLE knee flexed and valgus position. Patient does exhibit increased stiffness in RLE knee with decreased ROM. MMT unable to be assessed due to time constraints. She does ambulate with slight antalgic gait pattern with uneven cadence/step length. Patient would benefit from skilled PT intervention to improve strength, mobility and reduce knee pain with ADLs.    Personal Factors and Comorbidities  Age;Comorbidity 3+;Past/Current Experience    Comorbidities  PMH: significant for DMx2 (controlled), HTN (controlled), chronic dizziness/impaired vision in left eye, imbalance, A-fib (on blood thinner),    Examination-Activity Limitations  Bed Mobility;Caring for Others;Lift;Locomotion Level;Sit;Sleep;Squat;Stairs;Stand;Transfers    Examination-Participation Restrictions  Church;Cleaning;Community Activity;Shop;Volunteer;Yard Work    [employer]  Evolving/Moderate complexity    Clinical Decision Making  Moderate    Rehab Potential  Good    PT Frequency  2x / week    PT Duration  8 weeks    PT Treatment/Interventions  ADLs/Self Care Home Management;Cryotherapy;Electrical Stimulation;Moist Heat;Gait training;Stair training;Functional mobility training;Therapeutic activities;Therapeutic exercise;Balance training;Neuromuscular re-education;Patient/family education;Manual techniques;Passive range of motion;Dry needling;Energy conservation;Taping    PT Next Visit Plan  assess strength, 6 min walk test, initiate HEP    PT Home Exercise Plan  will address next session;    Consulted and Agree with Plan of Care  Patient       Patient will benefit from skilled therapeutic intervention in order to improve the following deficits and impairments:  Abnormal gait, Decreased balance,  Decreased endurance, Decreased mobility, Difficulty walking, Improper body mechanics, Decreased range of motion, Decreased activity tolerance, Decreased strength, Pain  Visit Diagnosis: Acute pain  of right knee  Muscle weakness (generalized)  Unsteadiness on feet  Difficulty in walking, not elsewhere classified     Problem List Patient Active Problem List   Diagnosis Date Noted  . Left-sided chest pain 10/23/2019  . Overactive bladder 10/09/2019  . Lumbar sprain 03/28/2019  . Tinnitus, bilateral 11/28/2017  . Abnormal MRI of head 11/14/2017  . Meningioma ([place]) 09/21/2017  . PAF (paroxysmal atrial fibrillation) ([place]) 02/03/2017  . Preventative health care 06/02/2016  . Advance directive discussed with patient 06/02/2016  . Achilles tendonitis 03/11/2016  . Hypertension   . DVT, lower extremity, recurrent ([place])   . Type 2 diabetes, controlled, with peripheral neuropathy ([place])   . Generalized osteoarthritis of multiple sites   . GERD (gastroesophageal reflux disease)   . Hyperthyroidism   . Retinal hole of right eye 06/26/2014  . Amblyopia, right 05/08/2014  . Status post intraocular lens implant 05/08/2014    Lorann Tani PT, DPT 12/25/2019, 8:23 AM  [place] MAIN [facility] SERVICES [address] [place], [place], [address] Phone: [phone]   Fax:  [phone]  Name: Zujey Stair MRN: [id number] Date of Birth: [birth year]

## 2019-12-30 ENCOUNTER — Ambulatory Visit: Payer: Medicare Other | Attending: Orthopedic Surgery | Admitting: Physical Therapy

## 2019-12-30 ENCOUNTER — Encounter: Payer: Self-pay | Admitting: Physical Therapy

## 2019-12-30 ENCOUNTER — Other Ambulatory Visit: Payer: Self-pay

## 2019-12-30 DIAGNOSIS — M6281 Muscle weakness (generalized): Secondary | ICD-10-CM | POA: Diagnosis not present

## 2019-12-30 DIAGNOSIS — M25561 Pain in right knee: Secondary | ICD-10-CM | POA: Diagnosis not present

## 2019-12-30 DIAGNOSIS — R262 Difficulty in walking, not elsewhere classified: Secondary | ICD-10-CM | POA: Diagnosis not present

## 2019-12-30 DIAGNOSIS — R2681 Unsteadiness on feet: Secondary | ICD-10-CM | POA: Diagnosis not present

## 2020-01-01 ENCOUNTER — Ambulatory Visit: Payer: Medicare Other | Admitting: Physical Therapy

## 2020-01-01 NOTE — Therapy (Signed)
[place] MAIN [facility] SERVICES [address] [place], [place], [address] Phone: [phone]   Fax:  [phone]  Physical Therapy Treatment  Patient Details  Name: Angela Bentley MRN: [id number] Date of Birth: 1941-02-14 Referring Provider (PT): Meredith Pel, MD   Encounter Date: 12/30/2019  PT End of Session - 01/01/20 0816    Visit Number  2    Number of Visits  17    Date for PT Re-Evaluation  02/19/20    PT Start Time  [id number]    PT Stop Time  1200    PT Time Calculation (min)  57 min    Equipment Utilized During Treatment  Gait belt    Activity Tolerance  Patient tolerated treatment well;No increased pain    Behavior During Therapy  WFL for tasks assessed/performed       Past Medical History:  Diagnosis Date  . Balance problems   . Brain tumor ([place])    a. Left intraventricular tumor ->stable by 06/2017 MRI. Followed @ [facility].  . Gait disturbance    a. Unsteady on feet/balance difficulty.  . Generalized osteoarthritis of multiple sites   . GERD (gastroesophageal reflux disease)   . History of DVT (deep vein thrombosis) 2016   estrogen and airflights. Rx with xarelto for 3 months  . History of stress test    a. 11/2016 ETT: No ST/T changes.  Performed to assess PAC/PVC burden with activity ->No ectopy noted.  . Hypertension   . Hyperthyroidism    treated briefly in college  . Insomnia   . Obesity   . PAF (paroxysmal atrial fibrillation) ([place])    a. 12/2016 Event Monitor: brief run of PAF-->CHA2DS2VASc = 5--> Xarelto.  . Plantar fasciitis   . Retinal tear of left eye   . Rosacea   . Type 2 diabetes, controlled, with peripheral neuropathy ([place])    a. 11/2016 A1c 7.4.  . Uterine fibroid     Past Surgical History:  Procedure Laterality Date  . CARPAL TUNNEL RELEASE Right    Dr Burney Gauze  . CATARACT EXTRACTION    . RETINAL TEAR REPAIR CRYOTHERAPY     10/09/13, then again 3/15    There were no vitals filed for this  visit.  Subjective Assessment - 01/01/20 0816    Subjective  Patient reports having a good Easter holiday. She reports some pain in RLE knee. She reports having one really good night iwth no pain; its been variable.    Pertinent History  79 yo Female reports increased right knee pain since December 2020. She was found to have right meniscus tear. She is hoping to treat conservatively and avoid surgery; She did get cortisone injection in January, with no significant relief. She did get gel injection approximately 4 weeks ago and reports just now feeling relief from that. She reports increased RLE lateral thigh pain. She reports pain does prevent sleeping with patient having trouble getting in a comfortable position. In addition she also reports increased posterior LLE knee discomfort either from piriformis syndrome or possible cyst in LLE knee. She presents to therapy without AD but does report decreased activity level due to fatigue from not sleeping well. She reports only sleeping for 3-4 hours at the most and then will have to get up and move around. She has been walking the dog for short walks. She admits she has not been exercising much and reports feeling out of shape. PMH: significant for DMx2 (controlled), HTN (controlled), chronic dizziness/impaired  vision in left eye, imbalance, A-fib (on blood thinner),    How long can you sit comfortably?  variable; 10 min or 1 hour    How long can you stand comfortably?  Short time    How long can you walk comfortably?  0.25 miles    Diagnostic tests  MRI in March 2020 shows Horizontal tear anterior body of the medial meniscus    Patient Stated Goals  "Improving standing position, reduce knee valgus position, reduce pain especially with improving sleep, increase endurance    Currently in Pain?  Yes    Pain Score  5     Pain Location  Knee    Pain Orientation  Right    Pain Descriptors / Indicators  Aching;Sore    Pain Type  Chronic pain    Pain Onset   More than a month ago    Pain Frequency  Intermittent    Aggravating Factors   tender    Pain Relieving Factors  relief from gel    Effect of Pain on Daily Activities  decreased activity tolerance;    Multiple Pain Sites  No    Pain Onset  1 to 4 weeks ago         [facility] PT Assessment - 01/01/20 0001      Strength   Right Hip Flexion  4+/5    Right Hip Extension  4-/5    Right Hip ABduction  3/5    Right Hip ADduction  3+/5    Left Hip Flexion  4-/5    Left Hip Extension  3+/5    Left Hip ABduction  4-/5    Left Hip ADduction  3+/5    Right Knee Flexion  4/5    Right Knee Extension  4+/5    Left Knee Flexion  4/5    Left Knee Extension  5/5    Right Ankle Dorsiflexion  4/5    Right Ankle Inversion  4/5    Right Ankle Eversion  4/5    Left Ankle Dorsiflexion  4-/5    Left Ankle Inversion  4-/5    Left Ankle Eversion  4-/5      6 minute walk test results    Aerobic Endurance Distance Walked  1000    Endurance additional comments  with increased shortness of breath        TREATMENT: PT assessed LE strength and instructed patient in 6 min walk test, see above;  She does exhibit increased pain along right SI joint. PT assessed SI joint with long sit test and prone knee bend test with both being positive with RLE short to long indicating possible posterior innominate rotation;    Patient continues to have increased pain and difficulty with sleeping; PT assessed patient's sleeping posture Recommended patient put a thigh pillow between knees for better LE positioning and to stay in sidelying with knees together to avoid lumbar rotation/twisting while in sidelying. Patient verbalized understanding;  Exercise following therapeutic activity:  Educated patient in RLE IT band stretches in supine with increased right knee pain reported, discontinued;  Educated patient in figure 4 stretch in sitting with cues to lean forward for better hip stretch, 30 sec hold x2  reps;  Tolerated session well. She was able to exhibit better gait mechanics during 6 min walk with less antalgic gait, however does exhibit left foot drag during ambulation. She also reports increased RLE pain with prolonged ambulation;  PT Education - 01/01/20 0816    Education Details  HEP/positioning    Person(s) Educated  Patient    Methods  Explanation;Verbal cues    Comprehension  Verbalized understanding;Returned demonstration;Verbal cues required;Need further instruction       PT Short Term Goals - 12/25/19 0816      PT SHORT TERM GOAL #1   Title  Patient will be adherent to HEP at least 3x a week to improve functional strength and balance for better safety at home.    Time  4    Period  Weeks    Status  New    Target Date  01/22/20      PT SHORT TERM GOAL #2   Title  Patient will increase RLE knee AROM extension to within 5 degrees of neutral for better standing and walking tolerance;     Time  4    Period  Weeks    Status  New    Target Date  01/22/20      PT SHORT TERM GOAL #3   Title  Patient will reports maximum of 1 sleep disturbances per night over last 3 days to exhibit improved sleeping tolerance with less knee pain and reduced fatigue.     Time  4    Period  Weeks    Status  New    Target Date  01/22/20        PT Long Term Goals - 12/25/19 0817      PT LONG TERM GOAL #1   Title  Patient will increase BLE gross strength to 4+/5 as to improve functional strength for independent gait, increased standing tolerance and increased ADL ability.    Time  8    Period  Weeks    Status  New    Target Date  02/19/20      PT LONG TERM GOAL #2   Title  Patient will improve FOTO score to >55/100 to indicate improved functional mobility with ADLs.    Time  8    Period  Weeks    Status  New    Target Date  02/19/20      PT LONG TERM GOAL #3   Title  Patient will report a worst pain of 3/10 on VAS in      right knee       to  improve tolerance with ADLs and reduced symptoms with activities.     Time  8    Period  Weeks    Status  New    Target Date  02/19/20      PT LONG TERM GOAL #4   Title  Patient will complete >1000 feet on 6 min walk test without increase in knee pain to improve community ambulator distance for functional tasks and return to PLOF.    Time  8    Period  Weeks    Status  New    Target Date  02/19/20      PT LONG TERM GOAL #5   Title  Patient (> 8 years old) will complete five times sit to stand test in < 15 seconds indicating an increased LE strength and improved balance.    Time  8    Period  Weeks    Status  New    Target Date  02/19/20            Plan - 01/01/20 0817    Clinical Impression Statement  Patient motivated and participated well within session.  She does exhibit increased weakness in BLE particularly in right hip abductors. Patient also exhibits increased tenderness along right lateral thigh consistent with possible IT band syndrome. This could be exacerbated by the knee pain and compensation during gait. Patient was able to complete 1000 feet on 6 min walk test. Although she does exhibit increased LLE foot drag and slight antalgic gait pattern. She exhibits increased pain along R SI joint. PT assessed SI joint indicating possible posterior innominate rotation with positive long sit and prone knee bend tests. Patient instructed in proper sleeping position and LE stretches as part of HEP. She would benefit from additional skilled PT intervention to improve strength, mobility and reduce pain with ADLs.    Personal Factors and Comorbidities  Age;Comorbidity 3+;Past/Current Experience    Comorbidities  PMH: significant for DMx2 (controlled), HTN (controlled), chronic dizziness/impaired vision in left eye, imbalance, A-fib (on blood thinner),    Examination-Activity Limitations  Bed Mobility;Caring for Others;Lift;Locomotion Level;Sit;Sleep;Squat;Stairs;Stand;Transfers     Examination-Participation Restrictions  Church;Cleaning;Community Activity;Shop;Volunteer;Yard Work    [employer] officer  Evolving/Moderate complexity    Rehab Potential  Good    PT Frequency  2x / week    PT Duration  8 weeks    PT Treatment/Interventions  ADLs/Self Care Home Management;Cryotherapy;Electrical Stimulation;Moist Heat;Gait training;Stair training;Functional mobility training;Therapeutic activities;Therapeutic exercise;Balance training;Neuromuscular re-education;Patient/family education;Manual techniques;Passive range of motion;Dry needling;Energy conservation;Taping    PT Next Visit Plan  assess strength, 6 min walk test, initiate HEP    PT Home Exercise Plan  will address next session;    Consulted and Agree with Plan of Care  Patient       Patient will benefit from skilled therapeutic intervention in order to improve the following deficits and impairments:  Abnormal gait, Decreased balance, Decreased endurance, Decreased mobility, Difficulty walking, Improper body mechanics, Decreased range of motion, Decreased activity tolerance, Decreased strength, Pain  Visit Diagnosis: Acute pain of right knee  Muscle weakness (generalized)  Unsteadiness on feet  Difficulty in walking, not elsewhere classified     Problem List Patient Active Problem List   Diagnosis Date Noted  . Left-sided chest pain 10/23/2019  . Overactive bladder 10/09/2019  . Lumbar sprain 03/28/2019  . Tinnitus, bilateral 11/28/2017  . Abnormal MRI of head 11/14/2017  . Meningioma ([place]) 09/21/2017  . PAF (paroxysmal atrial fibrillation) ([employer]) 02/03/2017  . Preventative health care 06/02/2016  . Advance directive discussed with patient 06/02/2016  . Achilles tendonitis 03/11/2016  . Hypertension   . DVT, lower extremity, recurrent ([place])   . Type 2 diabetes, controlled, with peripheral neuropathy ([place])   . Generalized osteoarthritis of multiple sites   . GERD (gastroesophageal  reflux disease)   . Hyperthyroidism   . Retinal hole of right eye 06/26/2014  . Amblyopia, right 05/08/2014  . Status post intraocular lens implant 05/08/2014    Delories Mauri PT, DPT 01/01/2020, 8:23 AM  [place] MAIN [facility] SERVICES [address] [place], [place], [address] Phone: [phone]   Fax:  [phone]  Name: Angela Bentley MRN: [id number] Date of Birth: 09-15-1941

## 2020-01-06 ENCOUNTER — Other Ambulatory Visit: Payer: Self-pay

## 2020-01-06 ENCOUNTER — Ambulatory Visit: Payer: Medicare Other | Admitting: Physical Therapy

## 2020-01-06 ENCOUNTER — Encounter: Payer: Self-pay | Admitting: Physical Therapy

## 2020-01-06 DIAGNOSIS — M6281 Muscle weakness (generalized): Secondary | ICD-10-CM

## 2020-01-06 DIAGNOSIS — R2681 Unsteadiness on feet: Secondary | ICD-10-CM

## 2020-01-06 DIAGNOSIS — R262 Difficulty in walking, not elsewhere classified: Secondary | ICD-10-CM

## 2020-01-06 DIAGNOSIS — M25561 Pain in right knee: Secondary | ICD-10-CM | POA: Diagnosis not present

## 2020-01-06 NOTE — Patient Instructions (Signed)
Access Code: [email or web address] URL: [email or web address] Date: 01/06/2020 Prepared by: Blanche East  Exercises Supine Quad Set - 1 x daily - 7 x weekly - 2 sets - 10 reps - 5 sec hold hold Straight Leg Raise with External Rotation - 1 x daily - 7 x weekly - 2 sets - 5 reps Supine Lower Trunk Rotation - 1 x daily - 7 x weekly - 1 sets - 10 reps

## 2020-01-06 NOTE — Therapy (Signed)
[place] MAIN [facility] SERVICES [address] [place], [place], [address] Phone: [phone]   Fax:  [phone]  Physical Therapy Treatment  Patient Details  Name: Angela Bentley MRN: [id number] Date of Birth: 04-19-1941 Referring Provider (PT): Meredith Pel, MD   Encounter Date: 01/06/2020  PT End of Session - 01/06/20 1108    Visit Number  3    Number of Visits  17    Date for PT Re-Evaluation  02/19/20    PT Start Time  1103    PT Stop Time  1145    PT Time Calculation (min)  42 min    Equipment Utilized During Treatment  Gait belt    Activity Tolerance  Patient tolerated treatment well;No increased pain    Behavior During Therapy  WFL for tasks assessed/performed       Past Medical History:  Diagnosis Date  . Balance problems   . Brain tumor ([place])    a. Left intraventricular tumor ->stable by 06/2017 MRI. Followed @ [facility].  . Gait disturbance    a. Unsteady on feet/balance difficulty.  . Generalized osteoarthritis of multiple sites   . GERD (gastroesophageal reflux disease)   . History of DVT (deep vein thrombosis) 2016   estrogen and airflights. Rx with xarelto for 3 months  . History of stress test    a. 11/2016 ETT: No ST/T changes.  Performed to assess PAC/PVC burden with activity ->No ectopy noted.  . Hypertension   . Hyperthyroidism    treated briefly in college  . Insomnia   . Obesity   . PAF (paroxysmal atrial fibrillation) ([place])    a. 12/2016 Event Monitor: brief run of PAF-->CHA2DS2VASc = 5--> Xarelto.  . Plantar fasciitis   . Retinal tear of left eye   . Rosacea   . Type 2 diabetes, controlled, with peripheral neuropathy ([place])    a. 11/2016 A1c 7.4.  . Uterine fibroid     Past Surgical History:  Procedure Laterality Date  . CARPAL TUNNEL RELEASE Right    Dr Burney Gauze  . CATARACT EXTRACTION    . RETINAL TEAR REPAIR CRYOTHERAPY     10/09/13, then again 3/15    There were no vitals filed for this  visit.  Subjective Assessment - 01/06/20 1107    Subjective  Patient reports sleeping better over last week. She is having increased RLE lateral leg pain; She reports the knee in general has been pretty sore.    Pertinent History  79 yo Female reports increased right knee pain since December 2020. She was found to have right meniscus tear. She is hoping to treat conservatively and avoid surgery; She did get cortisone injection in January, with no significant relief. She did get gel injection approximately 4 weeks ago and reports just now feeling relief from that. She reports increased RLE lateral thigh pain. She reports pain does prevent sleeping with patient having trouble getting in a comfortable position. In addition she also reports increased posterior LLE knee discomfort either from piriformis syndrome or possible cyst in LLE knee. She presents to therapy without AD but does report decreased activity level due to fatigue from not sleeping well. She reports only sleeping for 3-4 hours at the most and then will have to get up and move around. She has been walking the dog for short walks. She admits she has not been exercising much and reports feeling out of shape. PMH: significant for DMx2 (controlled), HTN (controlled), chronic dizziness/impaired vision in  left eye, imbalance, A-fib (on blood thinner),    How long can you sit comfortably?  variable; 10 min or 1 hour    How long can you stand comfortably?  Short time    How long can you walk comfortably?  0.25 miles    Diagnostic tests  MRI in March 2020 shows Horizontal tear anterior body of the medial meniscus    Patient Stated Goals  "Improving standing position, reduce knee valgus position, reduce pain especially with improving sleep, increase endurance    Currently in Pain?  Yes    Pain Score  5     Pain Location  Knee    Pain Orientation  Right    Pain Descriptors / Indicators  Aching;Sore    Pain Type  Chronic pain    Pain Onset  More than  a month ago    Pain Frequency  Intermittent    Aggravating Factors   tender    Pain Relieving Factors  relief from gel    Effect of Pain on Daily Activities  decreased activity tolerance;    Multiple Pain Sites  No    Pain Onset  1 to 4 weeks ago       TREATMENT: Patient reports having trouble with IT band stretch; Re-educated patient in IT band stretch and proper positioning;   Patient supine:  RLE quad sets with towel roll under leg 5 sec hold x10 reps RLE SLR with external rotation to improve VMO activation 2x5 Lumbar trunk rotation left to midline to improve stretch to lateral right hip; Patient required min-moderate verbal/tactile cues for correct exercise technique including proper positioning for optimal muscle strength;   Sidelying: RLE hip abduction isometric clamshells 5 sec hold x10 reps; Patient reports increased pain along right hip and right lateral knee with isometric but reports pain is mild. Patient exhibits moderate tenderness along IT band;  PT performed soft tissue massage to right IT band with rolling stick x2-3 min;  Recommended patient start using rolling stick intermittently to help improve tissue extensibility and reduce tightness;  Patient tolerated session well. She denies any increase in pain. She was able to exhibit better muscle activation with less soreness. Patient is scheduled to go to the beach for 2 weeks. Reinforced HEP and will re-assess when she returns.                         PT Education - 01/06/20 1108    Education Details  LE strength, HEP, positioning;    Person(s) Educated  Patient    Methods  Explanation;Verbal cues    Comprehension  Verbalized understanding;Returned demonstration;Verbal cues required;Need further instruction       PT Short Term Goals - 12/25/19 0816      PT SHORT TERM GOAL #1   Title  Patient will be adherent to HEP at least 3x a week to improve functional strength and balance for better  safety at home.    Time  4    Period  Weeks    Status  New    Target Date  01/22/20      PT SHORT TERM GOAL #2   Title  Patient will increase RLE knee AROM extension to within 5 degrees of neutral for better standing and walking tolerance;     Time  4    Period  Weeks    Status  New    Target Date  01/22/20      PT  SHORT TERM GOAL #3   Title  Patient will reports maximum of 1 sleep disturbances per night over last 3 days to exhibit improved sleeping tolerance with less knee pain and reduced fatigue.     Time  4    Period  Weeks    Status  New    Target Date  01/22/20        PT Long Term Goals - 12/25/19 0817      PT LONG TERM GOAL #1   Title  Patient will increase BLE gross strength to 4+/5 as to improve functional strength for independent gait, increased standing tolerance and increased ADL ability.    Time  8    Period  Weeks    Status  New    Target Date  02/19/20      PT LONG TERM GOAL #2   Title  Patient will improve FOTO score to >55/100 to indicate improved functional mobility with ADLs.    Time  8    Period  Weeks    Status  New    Target Date  02/19/20      PT LONG TERM GOAL #3   Title  Patient will report a worst pain of 3/10 on VAS in      right knee       to improve tolerance with ADLs and reduced symptoms with activities.     Time  8    Period  Weeks    Status  New    Target Date  02/19/20      PT LONG TERM GOAL #4   Title  Patient will complete >1000 feet on 6 min walk test without increase in knee pain to improve community ambulator distance for functional tasks and return to PLOF.    Time  8    Period  Weeks    Status  New    Target Date  02/19/20      PT LONG TERM GOAL #5   Title  Patient (> 8 years old) will complete five times sit to stand test in < 15 seconds indicating an increased LE strength and improved balance.    Time  8    Period  Weeks    Status  New    Target Date  02/19/20            Plan - 01/06/20 1309    Clinical  Impression Statement  Patient motivated and participated well within session. She reports increased RLE discomfort today but overall reports less pain at night when sleeping. Instructed patient in RLE quad strengthening/stabilization exercise. patient did require min VCs for proper positioning and exercise technique for optimal strengthening. Patient instructed in advanced HEP, see patient instructions. She would benefit from additional skilled PT intervention to improve strength and mobility while reducing leg pain;    Personal Factors and Comorbidities  Age;Comorbidity 3+;Past/Current Experience    Comorbidities  PMH: significant for DMx2 (controlled), HTN (controlled), chronic dizziness/impaired vision in left eye, imbalance, A-fib (on blood thinner),    Examination-Activity Limitations  Bed Mobility;Caring for Others;Lift;Locomotion Level;Sit;Sleep;Squat;Stairs;Stand;Transfers    Examination-Participation Restrictions  Church;Cleaning;Community Activity;Shop;Volunteer;Yard Work    [employer] officer  Evolving/Moderate complexity    Rehab Potential  Good    PT Frequency  2x / week    PT Duration  8 weeks    PT Treatment/Interventions  ADLs/Self Care Home Management;Cryotherapy;Electrical Stimulation;Moist Heat;Gait training;Stair training;Functional mobility training;Therapeutic activities;Therapeutic exercise;Balance training;Neuromuscular re-education;Patient/family education;Manual techniques;Passive range of motion;Dry needling;Energy conservation;Taping  PT Next Visit Plan  assess strength, 6 min walk test, initiate HEP    PT Home Exercise Plan  will address next session;    Consulted and Agree with Plan of Care  Patient       Patient will benefit from skilled therapeutic intervention in order to improve the following deficits and impairments:  Abnormal gait, Decreased balance, Decreased endurance, Decreased mobility, Difficulty walking, Improper body mechanics, Decreased  range of motion, Decreased activity tolerance, Decreased strength, Pain  Visit Diagnosis: Acute pain of right knee  Muscle weakness (generalized)  Unsteadiness on feet  Difficulty in walking, not elsewhere classified     Problem List Patient Active Problem List   Diagnosis Date Noted  . Left-sided chest pain 10/23/2019  . Overactive bladder 10/09/2019  . Lumbar sprain 03/28/2019  . Tinnitus, bilateral 11/28/2017  . Abnormal MRI of head 11/14/2017  . Meningioma ([place]) 09/21/2017  . PAF (paroxysmal atrial fibrillation) ([place]) 02/03/2017  . Preventative health care 06/02/2016  . Advance directive discussed with patient 06/02/2016  . Achilles tendonitis 03/11/2016  . Hypertension   . DVT, lower extremity, recurrent ([place])   . Type 2 diabetes, controlled, with peripheral neuropathy ([place])   . Generalized osteoarthritis of multiple sites   . GERD (gastroesophageal reflux disease)   . Hyperthyroidism   . Retinal hole of right eye 06/26/2014  . Amblyopia, right 05/08/2014  . Status post intraocular lens implant 05/08/2014    Hardin Hardenbrook PT, DPT 01/06/2020, 1:19 PM  [place] MAIN [facility] SERVICES [address] [place], [place], [address] Phone: [phone]   Fax:  [phone]  Name: Darwin Caldron MRN: [id number] Date of Birth: 24-Mar-1941

## 2020-01-14 ENCOUNTER — Encounter: Payer: Medicare Other | Admitting: Physical Therapy

## 2020-01-16 ENCOUNTER — Encounter: Payer: Medicare Other | Admitting: Physical Therapy

## 2020-01-21 ENCOUNTER — Ambulatory Visit: Payer: Medicare Other | Admitting: Physical Therapy

## 2020-01-21 ENCOUNTER — Other Ambulatory Visit: Payer: Self-pay

## 2020-01-21 ENCOUNTER — Encounter: Payer: Self-pay | Admitting: Physical Therapy

## 2020-01-21 DIAGNOSIS — R2681 Unsteadiness on feet: Secondary | ICD-10-CM

## 2020-01-21 DIAGNOSIS — R262 Difficulty in walking, not elsewhere classified: Secondary | ICD-10-CM

## 2020-01-21 DIAGNOSIS — M25561 Pain in right knee: Secondary | ICD-10-CM | POA: Diagnosis not present

## 2020-01-21 DIAGNOSIS — M6281 Muscle weakness (generalized): Secondary | ICD-10-CM | POA: Diagnosis not present

## 2020-01-21 NOTE — Therapy (Signed)
[place] MAIN [facility] SERVICES [address] [place], [place], [address] Phone: [phone]   Fax:  [phone]  Physical Therapy Treatment  Patient Details  Name: Angela Bentley MRN: [id number] Date of Birth: September 22, 1941 Referring Provider (PT): Meredith Pel, MD   Encounter Date: 01/21/2020  PT End of Session - 01/21/20 1344    Visit Number  4    Number of Visits  17    Date for PT Re-Evaluation  02/19/20    PT Start Time  1102    PT Stop Time  1145    PT Time Calculation (min)  43 min    Equipment Utilized During Treatment  Gait belt    Activity Tolerance  Patient tolerated treatment well;No increased pain    Behavior During Therapy  WFL for tasks assessed/performed       Past Medical History:  Diagnosis Date  . Balance problems   . Brain tumor ([place])    a. Left intraventricular tumor ->stable by 06/2017 MRI. Followed @ [facility].  . Gait disturbance    a. Unsteady on feet/balance difficulty.  . Generalized osteoarthritis of multiple sites   . GERD (gastroesophageal reflux disease)   . History of DVT (deep vein thrombosis) 2016   estrogen and airflights. Rx with xarelto for 3 months  . History of stress test    a. 11/2016 ETT: No ST/T changes.  Performed to assess PAC/PVC burden with activity ->No ectopy noted.  . Hypertension   . Hyperthyroidism    treated briefly in college  . Insomnia   . Obesity   . PAF (paroxysmal atrial fibrillation) ([place])    a. 12/2016 Event Monitor: brief run of PAF-->CHA2DS2VASc = 5--> Xarelto.  . Plantar fasciitis   . Retinal tear of left eye   . Rosacea   . Type 2 diabetes, controlled, with peripheral neuropathy ([place])    a. 11/2016 A1c 7.4.  . Uterine fibroid     Past Surgical History:  Procedure Laterality Date  . CARPAL TUNNEL RELEASE Right    Dr Burney Gauze  . CATARACT EXTRACTION    . RETINAL TEAR REPAIR CRYOTHERAPY     10/09/13, then again 3/15    There were no vitals filed for this  visit.  Subjective Assessment - 01/21/20 1110    Subjective  Patient reports sleeping better and not having as much pain at night; She is still having pain with standing and walking. She reports falling a few days ago and hitting the front of the right knee but doesnt  think that injured it.    Pertinent History  79 yo Female reports increased right knee pain since December 2020. She was found to have right meniscus tear. She is hoping to treat conservatively and avoid surgery; She did get cortisone injection in January, with no significant relief. She did get gel injection approximately 4 weeks ago and reports just now feeling relief from that. She reports increased RLE lateral thigh pain. She reports pain does prevent sleeping with patient having trouble getting in a comfortable position. In addition she also reports increased posterior LLE knee discomfort either from piriformis syndrome or possible cyst in LLE knee. She presents to therapy without AD but does report decreased activity level due to fatigue from not sleeping well. She reports only sleeping for 3-4 hours at the most and then will have to get up and move around. She has been walking the dog for short walks. She admits she has not been exercising  much and reports feeling out of shape. PMH: significant for DMx2 (controlled), HTN (controlled), chronic dizziness/impaired vision in left eye, imbalance, A-fib (on blood thinner),    How long can you sit comfortably?  variable; 10 min or 1 hour    How long can you stand comfortably?  Short time    How long can you walk comfortably?  0.25 miles    Diagnostic tests  MRI in March 2020 shows Horizontal tear anterior body of the medial meniscus    Patient Stated Goals  "Improving standing position, reduce knee valgus position, reduce pain especially with improving sleep, increase endurance    Currently in Pain?  Yes    Pain Location  Knee    Pain Orientation  Right    Pain Descriptors / Indicators   Aching;Sore    Pain Type  Chronic pain    Pain Onset  More than a month ago    Pain Frequency  Intermittent    Aggravating Factors   tender    Pain Relieving Factors  relief from gel    Effect of Pain on Daily Activities  decreased activity tolerance;    Multiple Pain Sites  No    Pain Onset  1 to 4 weeks ago             TREATMENT: Patient sitting: PT instructed patient in LAQ AROM x5 reps with PT assessing knee kinematics with increased clicking/popping noted;  PT performed grade II-III AP mobs to right knee tibia on femur with knee flexed at rest 10 sec bouts with passive RLE knee extension and flexion to facilitate improved knee kinematics x3 sets Progressed to grade II-III AP mobs to right tibia on femur with knee extended 10 sec bouts x3 sets with passive knee flexion/extension;  Patient supine:  RLE quad sets with small ball under knee under leg 5 sec hold x10 reps RLE SLR with external rotation to improve VMO activation x10 reps Lumbar trunk rotation left to midline to improve stretch to lateral right hip; Patient required min-moderate verbal/tactile cues for correct exercise technique including proper positioning for optimal muscle strength;   Manual therapy: PT performed soft tissue massage using IASTM to right distal quad along superior patella x10 min Patient exhibits increased tightness initially with mild tenderness. With increased manual therapy patient able to exhibit better tissue extensibility with less tightness and tenderness  Following manual therapy, PT instructed patient in sitting with knee flexion/extension. Significant improvement in knee ROM with less tenderness and less clicking noted. Patient states, "It feels better."                      PT Education - 01/21/20 1343    Education Details  LE strength, HEP    Person(s) Educated  Patient    Methods  Explanation;Verbal cues    Comprehension  Verbalized understanding;Returned  demonstration;Verbal cues required;Need further instruction       PT Short Term Goals - 12/25/19 0816      PT SHORT TERM GOAL #1   Title  Patient will be adherent to HEP at least 3x a week to improve functional strength and balance for better safety at home.    Time  4    Period  Weeks    Status  New    Target Date  01/22/20      PT SHORT TERM GOAL #2   Title  Patient will increase RLE knee AROM extension to within 5 degrees of neutral for  better standing and walking tolerance;     Time  4    Period  Weeks    Status  New    Target Date  01/22/20      PT SHORT TERM GOAL #3   Title  Patient will reports maximum of 1 sleep disturbances per night over last 3 days to exhibit improved sleeping tolerance with less knee pain and reduced fatigue.     Time  4    Period  Weeks    Status  New    Target Date  01/22/20        PT Long Term Goals - 12/25/19 0817      PT LONG TERM GOAL #1   Title  Patient will increase BLE gross strength to 4+/5 as to improve functional strength for independent gait, increased standing tolerance and increased ADL ability.    Time  8    Period  Weeks    Status  New    Target Date  02/19/20      PT LONG TERM GOAL #2   Title  Patient will improve FOTO score to >55/100 to indicate improved functional mobility with ADLs.    Time  8    Period  Weeks    Status  New    Target Date  02/19/20      PT LONG TERM GOAL #3   Title  Patient will report a worst pain of 3/10 on VAS in      right knee       to improve tolerance with ADLs and reduced symptoms with activities.     Time  8    Period  Weeks    Status  New    Target Date  02/19/20      PT LONG TERM GOAL #4   Title  Patient will complete >1000 feet on 6 min walk test without increase in knee pain to improve community ambulator distance for functional tasks and return to PLOF.    Time  8    Period  Weeks    Status  New    Target Date  02/19/20      PT LONG TERM GOAL #5   Title  Patient (> 74 years  old) will complete five times sit to stand test in < 15 seconds indicating an increased LE strength and improved balance.    Time  8    Period  Weeks    Status  New    Target Date  02/19/20            Plan - 01/21/20 1344    Clinical Impression Statement  Patient motivated and participated well within session. She reports continued RLE knee pain but states that her pain at night has diminished some. She exhibits increased stiffness and tightness along right quadriceps particularly along superior patella. PT performed soft tissue massage to help reduce tightness. Patient tolerated well and was able to exhibit improved ROM and less clicking at end of session. She was instructed in advanced LE strengthening exercise. Reinforced HEP. Patient would benefit from additional skilled PT Intervention to improve strength, balance and mobility while reducing knee pain;    Personal Factors and Comorbidities  Age;Comorbidity 3+;Past/Current Experience    Comorbidities  PMH: significant for DMx2 (controlled), HTN (controlled), chronic dizziness/impaired vision in left eye, imbalance, A-fib (on blood thinner),    Examination-Activity Limitations  Bed Mobility;Caring for Others;Lift;Locomotion Level;Sit;Sleep;Squat;Stairs;Stand;Transfers    Examination-Participation Restrictions  Church;Cleaning;Community Activity;Shop;Volunteer;[employer] Work  Stability/Clinical Decision Making  Evolving/Moderate complexity    Rehab Potential  Good    PT Frequency  2x / week    PT Duration  8 weeks    PT Treatment/Interventions  ADLs/Self Care Home Management;Cryotherapy;Electrical Stimulation;Moist Heat;Gait training;Stair training;Functional mobility training;Therapeutic activities;Therapeutic exercise;Balance training;Neuromuscular re-education;Patient/family education;Manual techniques;Passive range of motion;Dry needling;Energy conservation;Taping    PT Next Visit Plan  assess strength, 6 min walk test, initiate HEP    PT  Home Exercise Plan  will address next session;    Consulted and Agree with Plan of Care  Patient       Patient will benefit from skilled therapeutic intervention in order to improve the following deficits and impairments:  Abnormal gait, Decreased balance, Decreased endurance, Decreased mobility, Difficulty walking, Improper body mechanics, Decreased range of motion, Decreased activity tolerance, Decreased strength, Pain  Visit Diagnosis: Acute pain of right knee  Muscle weakness (generalized)  Unsteadiness on feet  Difficulty in walking, not elsewhere classified     Problem List Patient Active Problem List   Diagnosis Date Noted  . Left-sided chest pain 10/23/2019  . Overactive bladder 10/09/2019  . Lumbar sprain 03/28/2019  . Tinnitus, bilateral 11/28/2017  . Abnormal MRI of head 11/14/2017  . Meningioma ([place]) 09/21/2017  . PAF (paroxysmal atrial fibrillation) ([place]) 02/03/2017  . Preventative health care 06/02/2016  . Advance directive discussed with patient 06/02/2016  . Achilles tendonitis 03/11/2016  . Hypertension   . DVT, lower extremity, recurrent ([place])   . Type 2 diabetes, controlled, with peripheral neuropathy ([place])   . Generalized osteoarthritis of multiple sites   . GERD (gastroesophageal reflux disease)   . Hyperthyroidism   . Retinal hole of right eye 06/26/2014  . Amblyopia, right 05/08/2014  . Status post intraocular lens implant 05/08/2014    Alonzo Loving PT, DPT 01/21/2020, 1:56 PM  [place] MAIN [facility] SERVICES [address] [place], [place], [address] Phone: [phone]   Fax:  [phone]  Name: Angela Bentley MRN: [id number] Date of Birth: December 13, 1940

## 2020-01-23 ENCOUNTER — Ambulatory Visit: Payer: Medicare Other | Admitting: Physical Therapy

## 2020-01-23 ENCOUNTER — Other Ambulatory Visit: Payer: Self-pay

## 2020-01-23 ENCOUNTER — Encounter: Payer: Self-pay | Admitting: Physical Therapy

## 2020-01-23 DIAGNOSIS — R262 Difficulty in walking, not elsewhere classified: Secondary | ICD-10-CM

## 2020-01-23 DIAGNOSIS — R2681 Unsteadiness on feet: Secondary | ICD-10-CM | POA: Diagnosis not present

## 2020-01-23 DIAGNOSIS — M6281 Muscle weakness (generalized): Secondary | ICD-10-CM | POA: Diagnosis not present

## 2020-01-23 DIAGNOSIS — M25561 Pain in right knee: Secondary | ICD-10-CM

## 2020-01-23 NOTE — Patient Instructions (Signed)
Access Code: [email or web address] URL: [email or web address] Date: 01/23/2020 Prepared by: Blanche East  Exercises Supine Quad Set - 1 x daily - 7 x weekly - 2 sets - 10 reps - 5 sec hold hold Straight Leg Raise with External Rotation - 1 x daily - 7 x weekly - 1 sets - 12-15 reps Bent Knee Fallouts - 1 x daily - 7 x weekly - 1 sets - 15 reps Clamshell - 1 x daily - 7 x weekly - 1 sets - 10 reps Supine Lower Trunk Rotation - 1 x daily - 7 x weekly - 1 sets - 10 reps

## 2020-01-23 NOTE — Therapy (Signed)
[place] MAIN [facility] SERVICES [address] [place], [place], [address] Phone: [phone]   Fax:  [phone]  Physical Therapy Treatment  Patient Details  Name: Angela Bentley MRN: [id number] Date of Birth: [birth year] Referring Provider (PT): Meredith Pel, MD   Encounter Date: 01/23/2020  PT End of Session - 01/23/20 1225    Visit Number  5    Number of Visits  17    Date for PT Re-Evaluation  02/19/20    PT Start Time  1118    PT Stop Time  1200    PT Time Calculation (min)  42 min    Equipment Utilized During Treatment  Gait belt    Activity Tolerance  Patient tolerated treatment well;No increased pain    Behavior During Therapy  WFL for tasks assessed/performed       Past Medical History:  Diagnosis Date  . Balance problems   . Brain tumor ([place])    a. Left intraventricular tumor ->stable by 06/2017 MRI. Followed @ [facility].  . Gait disturbance    a. Unsteady on feet/balance difficulty.  . Generalized osteoarthritis of multiple sites   . GERD (gastroesophageal reflux disease)   . History of DVT (deep vein thrombosis) 2016   estrogen and airflights. Rx with xarelto for 3 months  . History of stress test    a. 11/2016 ETT: No ST/T changes.  Performed to assess PAC/PVC burden with activity ->No ectopy noted.  . Hypertension   . Hyperthyroidism    treated briefly in college  . Insomnia   . Obesity   . PAF (paroxysmal atrial fibrillation) ([employer])    a. 12/2016 Event Monitor: brief run of PAF-->CHA2DS2VASc = 5--> Xarelto.  . Plantar fasciitis   . Retinal tear of left eye   . Rosacea   . Type 2 diabetes, controlled, with peripheral neuropathy ([place])    a. 11/2016 A1c 7.4.  . Uterine fibroid     Past Surgical History:  Procedure Laterality Date  . CARPAL TUNNEL RELEASE Right    Dr Burney Gauze  . CATARACT EXTRACTION    . RETINAL TEAR REPAIR CRYOTHERAPY     10/09/13, then again 3/15    There were no vitals filed for this  visit.  Subjective Assessment - 01/23/20 1224    Subjective  Patient reports doing better. She reports significant reduction in knee pain. She reports not sleeping well last night but states that is not related to knee discomfort;    Pertinent History  79 yo Female reports increased right knee pain since December 2020. She was found to have right meniscus tear. She is hoping to treat conservatively and avoid surgery; She did get cortisone injection in January, with no significant relief. She did get gel injection approximately 4 weeks ago and reports just now feeling relief from that. She reports increased RLE lateral thigh pain. She reports pain does prevent sleeping with patient having trouble getting in a comfortable position. In addition she also reports increased posterior LLE knee discomfort either from piriformis syndrome or possible cyst in LLE knee. She presents to therapy without AD but does report decreased activity level due to fatigue from not sleeping well. She reports only sleeping for 3-4 hours at the most and then will have to get up and move around. She has been walking the dog for short walks. She admits she has not been exercising much and reports feeling out of shape. PMH: significant for DMx2 (controlled), HTN (controlled), chronic dizziness/impaired  vision in left eye, imbalance, A-fib (on blood thinner),    How long can you sit comfortably?  variable; 10 min or 1 hour    How long can you stand comfortably?  Short time    How long can you walk comfortably?  0.25 miles    Diagnostic tests  MRI in March 2020 shows Horizontal tear anterior body of the medial meniscus    Patient Stated Goals  "Improving standing position, reduce knee valgus position, reduce pain especially with improving sleep, increase endurance    Currently in Pain?  Yes    Pain Score  2     Pain Location  Knee    Pain Orientation  Right    Pain Descriptors / Indicators  Aching;Sore    Pain Type  Chronic pain     Pain Onset  More than a month ago    Pain Frequency  Intermittent    Aggravating Factors   tender    Pain Relieving Factors  relief from gel/massage    Effect of Pain on Daily Activities  decreased activity tolerance;    Multiple Pain Sites  No    Pain Onset  1 to 4 weeks ago             TREATMENT: Patient sitting: PT instructed patient in LAQ AROM x5 reps with PT assessing knee kinematics with increased clicking/popping noted;  Patient hooklying:  PT performed grade II-III AP mobs to right knee tibia on femur with knee flexed at 45 degrees flexion,  10 sec bouts x3 sets each;   Patient supine: RLE SLR with external rotation to improve VMO activation x15 reps; increased repetition on HEP with instruction to keep knee extended and avoid instability for better knee control;   Patient left sidelying:  RLE isometric hip abduction clamshells 5 sec hold x10 reps; Progressed to RLE hip abduction clamshell AROM x10 reps; Patient required min-moderate verbal/tactile cues for correct exercise technique including to avoid trunk rotation for better hip strengthening;   Lumbar trunk rotation left to midline to improve stretch to lateral right hip;  Advanced HEP- see patient instructions;   Manual therapy: PT performed soft tissue massage using IASTM to right distal quad along superior patella x10 min Patient exhibits less tightness along patella but has continued tightness along distal quad particularly in lateral muscle belly; Reports mild tenderness. With increased manual therapy patient able to exhibit better tissue extensibility with less tightness and tenderness  Following manual therapy, Patient tolerated well. She reports increased flexibility with less tightness. She was educated in ways to improve exercise and mobility at home including walking program. Plan to provide patient with aquatic exercise program to help with knee/hip discomfort as patient states pool will be opening  soon and she is interested in pool exercise.                    PT Education - 01/23/20 1224    Education Details  HEP    Person(s) Educated  Patient;Child(ren)    Methods  Explanation;Verbal cues;Handout    Comprehension  Verbalized understanding;Returned demonstration;Verbal cues required;Need further instruction       PT Short Term Goals - 12/25/19 0816      PT SHORT TERM GOAL #1   Title  Patient will be adherent to HEP at least 3x a week to improve functional strength and balance for better safety at home.    Time  4    Period  Weeks  Status  New    Target Date  01/22/20      PT SHORT TERM GOAL #2   Title  Patient will increase RLE knee AROM extension to within 5 degrees of neutral for better standing and walking tolerance;     Time  4    Period  Weeks    Status  New    Target Date  01/22/20      PT SHORT TERM GOAL #3   Title  Patient will reports maximum of 1 sleep disturbances per night over last 3 days to exhibit improved sleeping tolerance with less knee pain and reduced fatigue.     Time  4    Period  Weeks    Status  New    Target Date  01/22/20        PT Long Term Goals - 12/25/19 0817      PT LONG TERM GOAL #1   Title  Patient will increase BLE gross strength to 4+/5 as to improve functional strength for independent gait, increased standing tolerance and increased ADL ability.    Time  8    Period  Weeks    Status  New    Target Date  02/19/20      PT LONG TERM GOAL #2   Title  Patient will improve FOTO score to >55/100 to indicate improved functional mobility with ADLs.    Time  8    Period  Weeks    Status  New    Target Date  02/19/20      PT LONG TERM GOAL #3   Title  Patient will report a worst pain of 3/10 on VAS in      right knee       to improve tolerance with ADLs and reduced symptoms with activities.     Time  8    Period  Weeks    Status  New    Target Date  02/19/20      PT LONG TERM GOAL #4   Title  Patient will  complete >1000 feet on 6 min walk test without increase in knee pain to improve community ambulator distance for functional tasks and return to PLOF.    Time  8    Period  Weeks    Status  New    Target Date  02/19/20      PT LONG TERM GOAL #5   Title  Patient (> 31 years old) will complete five times sit to stand test in < 15 seconds indicating an increased LE strength and improved balance.    Time  8    Period  Weeks    Status  New    Target Date  02/19/20            Plan - 01/23/20 1225    Clinical Impression Statement  Patient motivated and participated well within session. She reports less knee pain. PT instructed patient in advanced HEP with advancement to hip abductor strengthening. Patient required min VCs for correct positioning and exercise technique for optimal muscle activation. She exhibits less tightness along superior patella but continues to have tightness in right quad particulalry in lateral tissues. PT performed soft/deep tissue massage utilizing IASTM for better tissue mobility. Patient tolerated well. She reports less stiffness and better tissue extensibility at end of session. Reinforced HEP;    Personal Factors and Comorbidities  Age;Comorbidity 3+;Past/Current Experience    Comorbidities  PMH: significant for DMx2 (controlled), HTN (controlled), chronic  dizziness/impaired vision in left eye, imbalance, A-fib (on blood thinner),    Examination-Activity Limitations  Bed Mobility;Caring for Others;Lift;Locomotion Level;Sit;Sleep;Squat;Stairs;Stand;Transfers    Examination-Participation Restrictions  Church;Cleaning;Community Activity;Shop;Volunteer;Yard Work    [employer]chant [employer] officer  Evolving/Moderate complexity    Rehab Potential  Good    PT Frequency  2x / week    PT Duration  8 weeks    PT Treatment/Interventions  ADLs/Self Care Home Management;Cryotherapy;Electrical Stimulation;Moist Heat;Gait training;Stair training;Functional mobility  training;Therapeutic activities;Therapeutic exercise;Balance training;Neuromuscular re-education;Patient/family education;Manual techniques;Passive range of motion;Dry needling;Energy conservation;Taping    PT Next Visit Plan  assess strength, 6 min walk test, initiate HEP    PT Home Exercise Plan  will address next session;    Consulted and Agree with Plan of Care  Patient       Patient will benefit from skilled therapeutic intervention in order to improve the following deficits and impairments:  Abnormal gait, Decreased balance, Decreased endurance, Decreased mobility, Difficulty walking, Improper body mechanics, Decreased range of motion, Decreased activity tolerance, Decreased strength, Pain  Visit Diagnosis: Acute pain of right knee  Muscle weakness (generalized)  Unsteadiness on feet  Difficulty in walking, not elsewhere classified     Problem List Patient Active Problem List   Diagnosis Date Noted  . Left-sided chest pain 10/23/2019  . Overactive bladder 10/09/2019  . Lumbar sprain 03/28/2019  . Tinnitus, bilateral 11/28/2017  . Abnormal MRI of head 11/14/2017  . Meningioma ([place]) 09/21/2017  . PAF (paroxysmal atrial fibrillation) ([place]) 02/03/2017  . Preventative health care 06/02/2016  . Advance directive discussed with patient 06/02/2016  . Achilles tendonitis 03/11/2016  . Hypertension   . DVT, lower extremity, recurrent ([place])   . Type 2 diabetes, controlled, with peripheral neuropathy ([place])   . Generalized osteoarthritis of multiple sites   . GERD (gastroesophageal reflux disease)   . Hyperthyroidism   . Retinal hole of right eye 06/26/2014  . Amblyopia, right 05/08/2014  . Status post intraocular lens implant 05/08/2014    Kalai Baca PT, DPT 01/23/2020, 12:31 PM  [place] MAIN [facility] SERVICES [address] [place], [place], [address] Phone: [phone]   Fax:  [phone]  Name: Aerin Lavanway MRN:  [id number] Date of Birth: 1941-09-14

## 2020-01-24 ENCOUNTER — Telehealth: Payer: Self-pay | Admitting: Internal Medicine

## 2020-01-24 NOTE — Progress Notes (Signed)
  Chronic Care Management   Outreach Note  01/24/2020 Name: Angela Bentley MRN: [id number] DOB: Mar 15, 1941  Referred by: [name], MD Reason for referral : No chief complaint on file.   An unsuccessful telephone outreach was attempted today. The patient was referred to the pharmacist for assistance with care management and care coordination.    This note is not being shared with the patient for the following reason: To respect privacy (The patient or proxy has requested that the information not be shared).  Follow Up Plan:   [name] [employer] Scheduler

## 2020-01-24 NOTE — Chronic Care Management (AMB) (Signed)
°  Chronic Care Management   Note  01/24/2020 Name: Jalliyah Pert MRN: [id number] DOB: Sep 11, 1941  Kearston Kohn is a 79 y.o. year old female who is a primary care patient of [name], Theophilus Kinds, MD. I reached out to [name] by phone today in response to a referral sent by Ms. [place] Lodge PCP, [name], MD.   Ms. Waldmann was given information about Chronic Care Management services today including:  1. CCM service includes personalized support from designated clinical staff supervised by her physician, including individualized plan of care and coordination with other care providers 2. 24/7 contact phone numbers for assistance for urgent and routine care needs. 3. Service will only be billed when office clinical staff spend 20 minutes or more in a month to coordinate care. 4. Only one practitioner may furnish and bill the service in a calendar month. 5. The patient may stop CCM services at any time (effective at the end of the month) by phone call to the office staff.   Patient agreed to services and verbal consent obtained.    This note is not being shared with the patient for the following reason: To respect privacy (The patient or proxy has requested that the information not be shared).  Follow up plan:   [name] [employer] Scheduler

## 2020-01-28 ENCOUNTER — Encounter: Payer: Self-pay | Admitting: Physical Therapy

## 2020-01-28 ENCOUNTER — Other Ambulatory Visit: Payer: Self-pay

## 2020-01-28 ENCOUNTER — Ambulatory Visit: Payer: Medicare Other | Attending: Orthopedic Surgery | Admitting: Physical Therapy

## 2020-01-28 DIAGNOSIS — M25561 Pain in right knee: Secondary | ICD-10-CM | POA: Diagnosis not present

## 2020-01-28 DIAGNOSIS — R2681 Unsteadiness on feet: Secondary | ICD-10-CM | POA: Diagnosis not present

## 2020-01-28 DIAGNOSIS — R262 Difficulty in walking, not elsewhere classified: Secondary | ICD-10-CM | POA: Diagnosis not present

## 2020-01-28 DIAGNOSIS — M6281 Muscle weakness (generalized): Secondary | ICD-10-CM | POA: Diagnosis not present

## 2020-01-28 NOTE — Therapy (Signed)
[place] MAIN [facility] SERVICES [address] [place], [place], [address] Phone: [phone]   Fax:  [phone]  Physical Therapy Treatment  Patient Details  Name: Angela Bentley MRN: [id number] Date of Birth: 1941/09/25 Referring Provider (PT): Meredith Pel, MD   Encounter Date: 01/28/2020  PT End of Session - 01/28/20 1145    Visit Number  6    Number of Visits  17    Date for PT Re-Evaluation  02/19/20    PT Start Time  1140    PT Stop Time  1225    PT Time Calculation (min)  45 min    Equipment Utilized During Treatment  Gait belt    Activity Tolerance  Patient tolerated treatment well;No increased pain    Behavior During Therapy  WFL for tasks assessed/performed       Past Medical History:  Diagnosis Date  . Balance problems   . Brain tumor ([place])    a. Left intraventricular tumor ->stable by 06/2017 MRI. Followed @ [facility].  . Gait disturbance    a. Unsteady on feet/balance difficulty.  . Generalized osteoarthritis of multiple sites   . GERD (gastroesophageal reflux disease)   . History of DVT (deep vein thrombosis) 2016   estrogen and airflights. Rx with xarelto for 3 months  . History of stress test    a. 11/2016 ETT: No ST/T changes.  Performed to assess PAC/PVC burden with activity ->No ectopy noted.  . Hypertension   . Hyperthyroidism    treated briefly in college  . Insomnia   . Obesity   . PAF (paroxysmal atrial fibrillation) ([place])    a. 12/2016 Event Monitor: brief run of PAF-->CHA2DS2VASc = 5--> Xarelto.  . Plantar fasciitis   . Retinal tear of left eye   . Rosacea   . Type 2 diabetes, controlled, with peripheral neuropathy ([place])    a. 11/2016 A1c 7.4.  . Uterine fibroid     Past Surgical History:  Procedure Laterality Date  . CARPAL TUNNEL RELEASE Right    Dr Burney Gauze  . CATARACT EXTRACTION    . RETINAL TEAR REPAIR CRYOTHERAPY     10/09/13, then again 3/15    There were no vitals filed for this  visit.  Subjective Assessment - 01/28/20 1143    Subjective  Patient reports slight increase in back pain with new HEP; She reports not being able to get on recumbent bike much; She reports her knee pain has been better;    Pertinent History  79 yo Female reports increased right knee pain since December 2020. She was found to have right meniscus tear. She is hoping to treat conservatively and avoid surgery; She did get cortisone injection in January, with no significant relief. She did get gel injection approximately 4 weeks ago and reports just now feeling relief from that. She reports increased RLE lateral thigh pain. She reports pain does prevent sleeping with patient having trouble getting in a comfortable position. In addition she also reports increased posterior LLE knee discomfort either from piriformis syndrome or possible cyst in LLE knee. She presents to therapy without AD but does report decreased activity level due to fatigue from not sleeping well. She reports only sleeping for 3-4 hours at the most and then will have to get up and move around. She has been walking the dog for short walks. She admits she has not been exercising much and reports feeling out of shape. PMH: significant for DMx2 (controlled), HTN (controlled),  chronic dizziness/impaired vision in left eye, imbalance, A-fib (on blood thinner),    How long can you sit comfortably?  variable; 10 min or 1 hour    How long can you stand comfortably?  Short time    How long can you walk comfortably?  0.25 miles    Diagnostic tests  MRI in March 2020 shows Horizontal tear anterior body of the medial meniscus    Patient Stated Goals  "Improving standing position, reduce knee valgus position, reduce pain especially with improving sleep, increase endurance    Currently in Pain?  Yes    Pain Score  4     Pain Location  Back    Pain Orientation  Right;Lower    Pain Descriptors / Indicators  Aching;Sore    Pain Type  Chronic pain    Pain  Onset  More than a month ago    Pain Frequency  Intermittent    Aggravating Factors   unsure    Pain Relieving Factors  rest/heat    Effect of Pain on Daily Activities  decreased activity tolerance;    Multiple Pain Sites  No    Pain Onset  1 to 4 weeks ago            TREATMENT: Warm up on Nustep, BUE/BLE level 2 x4 min (unbilled);   Leg press: BLE 40# 2x12 with cues for proper positioning; Patient able to exhibit good control without pain or discomfort;   Patient supine: RLE heel slides x15 reps with min VCs to increase end range of motion to tolerance;  Patient re-educated on quad sets for HEP with instruction to keep hold time but do less repetition if necessary to help reduce discomfort;  Patient left sidelying:  RLE isometric hip abduction clamshells 5 sec hold x10 reps; Patient required min-moderate verbal/tactile cues for correct exercise technique including to avoid trunk rotation for better hip strengthening;   Patient tolerated session well. She reports increased flexibility with less tightness. She was educated in ways to improve exercise and mobility at home including walking program. Plan to provide patient with aquatic exercise program to help with knee/hip discomfort as patient states pool will be opening soon and she is interested in pool exercise.   Patient denies any pain at end of session;                      PT Education - 01/28/20 1144    Education Details  HEP/positioning, strengthening;    Person(s) Educated  Patient;Child(ren)    Methods  Explanation;Verbal cues    Comprehension  Verbalized understanding;Verbal cues required;Returned demonstration;Need further instruction       PT Short Term Goals - 12/25/19 0816      PT SHORT TERM GOAL #1   Title  Patient will be adherent to HEP at least 3x a week to improve functional strength and balance for better safety at home.    Time  4    Period  Weeks    Status  New    Target Date   01/22/20      PT SHORT TERM GOAL #2   Title  Patient will increase RLE knee AROM extension to within 5 degrees of neutral for better standing and walking tolerance;     Time  4    Period  Weeks    Status  New    Target Date  01/22/20      PT SHORT TERM GOAL #3   Title  Patient will reports maximum of 1 sleep disturbances per night over last 3 days to exhibit improved sleeping tolerance with less knee pain and reduced fatigue.     Time  4    Period  Weeks    Status  New    Target Date  01/22/20        PT Long Term Goals - 12/25/19 0817      PT LONG TERM GOAL #1   Title  Patient will increase BLE gross strength to 4+/5 as to improve functional strength for independent gait, increased standing tolerance and increased ADL ability.    Time  8    Period  Weeks    Status  New    Target Date  02/19/20      PT LONG TERM GOAL #2   Title  Patient will improve FOTO score to >55/100 to indicate improved functional mobility with ADLs.    Time  8    Period  Weeks    Status  New    Target Date  02/19/20      PT LONG TERM GOAL #3   Title  Patient will report a worst pain of 3/10 on VAS in      right knee       to improve tolerance with ADLs and reduced symptoms with activities.     Time  8    Period  Weeks    Status  New    Target Date  02/19/20      PT LONG TERM GOAL #4   Title  Patient will complete >1000 feet on 6 min walk test without increase in knee pain to improve community ambulator distance for functional tasks and return to PLOF.    Time  8    Period  Weeks    Status  New    Target Date  02/19/20      PT LONG TERM GOAL #5   Title  Patient (> 48 years old) will complete five times sit to stand test in < 15 seconds indicating an increased LE strength and improved balance.    Time  8    Period  Weeks    Status  New    Target Date  02/19/20            Plan - 01/28/20 1305    Clinical Impression Statement  Patient motivated and participated well within session.  Instructed patient in advanced LE strengthening exercise. She denies any increase in pain with advanced exercise. Patient also exhibits less tightness in quad muscle superior patella this session. Patient does require min VCs for correct exercise technique. She would benefit from additional skilled PT intervention to improve strength, balance and mobility;    Personal Factors and Comorbidities  Age;Comorbidity 3+;Past/Current Experience    Comorbidities  PMH: significant for DMx2 (controlled), HTN (controlled), chronic dizziness/impaired vision in left eye, imbalance, A-fib (on blood thinner),    Examination-Activity Limitations  Bed Mobility;Caring for Others;Lift;Locomotion Level;Sit;Sleep;Squat;Stairs;Stand;Transfers    Examination-Participation Restrictions  Church;Cleaning;Community Activity;Shop;Volunteer;Yard Work    [employer]  Evolving/Moderate complexity    Rehab Potential  Good    PT Frequency  2x / week    PT Duration  8 weeks    PT Treatment/Interventions  ADLs/Self Care Home Management;Cryotherapy;Electrical Stimulation;Moist Heat;Gait training;Stair training;Functional mobility training;Therapeutic activities;Therapeutic exercise;Balance training;Neuromuscular re-education;Patient/family education;Manual techniques;Passive range of motion;Dry needling;Energy conservation;Taping    PT Next Visit Plan  assess strength, 6 min walk test, initiate HEP    PT  Home Exercise Plan  will address next session;    Consulted and Agree with Plan of Care  Patient       Patient will benefit from skilled therapeutic intervention in order to improve the following deficits and impairments:  Abnormal gait, Decreased balance, Decreased endurance, Decreased mobility, Difficulty walking, Improper body mechanics, Decreased range of motion, Decreased activity tolerance, Decreased strength, Pain  Visit Diagnosis: Acute pain of right knee  Muscle weakness (generalized)  Unsteadiness  on feet  Difficulty in walking, not elsewhere classified     Problem List Patient Active Problem List   Diagnosis Date Noted  . Left-sided chest pain 10/23/2019  . Overactive bladder 10/09/2019  . Lumbar sprain 03/28/2019  . Tinnitus, bilateral 11/28/2017  . Abnormal MRI of head 11/14/2017  . Meningioma ([place]) 09/21/2017  . PAF (paroxysmal atrial fibrillation) ([place]) 02/03/2017  . Preventative health care 06/02/2016  . Advance directive discussed with patient 06/02/2016  . Achilles tendonitis 03/11/2016  . Hypertension   . DVT, lower extremity, recurrent ([place])   . Type 2 diabetes, controlled, with peripheral neuropathy ([place])   . Generalized osteoarthritis of multiple sites   . GERD (gastroesophageal reflux disease)   . Hyperthyroidism   . Retinal hole of right eye 06/26/2014  . Amblyopia, right 05/08/2014  . Status post intraocular lens implant 05/08/2014    Jamesina Gaugh PT, DPT 01/28/2020, 1:10 PM  [place] MAIN [facility] SERVICES [address] [place], [place], [address] Phone: [phone]   Fax:  [phone]  Name: Angela Bentley MRN: [id number] Date of Birth: 1941/09/14

## 2020-01-30 ENCOUNTER — Ambulatory Visit: Payer: Medicare Other | Admitting: Physical Therapy

## 2020-02-03 NOTE — Progress Notes (Deleted)
Date:  02/03/2020   ID:  [name], DOB 08/30/1941, MRN [id number]  Patient Location:  [address] [place] [address]   Provider location:   [facility], [place] office  PCP:  [name], MD  Cardiologist:  Arvid Right [facility]   Chief Complaint:  Knee pain    History of Present Illness:    Angela Bentley is a 79 y.o. female who presents via audio/video conferencing for a telehealth visit today.   The patient does not symptoms concerning for COVID-19 infection (fever, chills, cough, or new SHORTNESS OF BREATH).   Patient has a past medical history of paroxysmal atrial fibrillation,  hypertension,  diabetes,  GERD,  persistent gait imbalance,  left intraventricular brain tumor,  prior history of DVT.  Who presents for f/u of her PAF  Pain,  Lack of sleep, no exercise No significant SOB No palpitations  Total chol 172, LDL 98, HBA1C 7.4  diagnosed with paroxysmal atrial fibrillation after complaining of palpitations and wearing an event monitor which showed a brief run of atrial fibrillation.  Xarelto  CHA2DS2VASc of 5.   No atrial fib by symptoms Sedentary lifestyle Chronic dizziness, balance issues with no falls  History of left intraventricular brain tumor.   followed by neurosurgery.  not currently interested in resection  Periodic MRIs  Reports having worsening ankle swelling sometimes take lasix   Prior CV studies:   The following studies were reviewed today:   Past Medical History:  Diagnosis Date  . Balance problems   . Brain tumor ([place])    a. Left intraventricular tumor ->stable by 06/2017 MRI. Followed @ [facility].  . Gait disturbance    a. Unsteady on feet/balance difficulty.  . Generalized osteoarthritis of multiple sites   . GERD (gastroesophageal reflux disease)   . History of DVT (deep vein thrombosis) 2016   estrogen and airflights. Rx with xarelto for 3 months  . History of  stress test    a. 11/2016 ETT: No ST/T changes.  Performed to assess PAC/PVC burden with activity ->No ectopy noted.  . Hypertension   . Hyperthyroidism    treated briefly in college  . Insomnia   . Obesity   . PAF (paroxysmal atrial fibrillation) ([place])    a. 12/2016 Event Monitor: brief run of PAF-->CHA2DS2VASc = 5--> Xarelto.  . Plantar fasciitis   . Retinal tear of left eye   . Rosacea   . Type 2 diabetes, controlled, with peripheral neuropathy ([place])    a. 11/2016 A1c 7.4.  . Uterine fibroid    Past Surgical History:  Procedure Laterality Date  . CARPAL TUNNEL RELEASE Right    Dr Burney Gauze  . CATARACT EXTRACTION    . RETINAL TEAR REPAIR CRYOTHERAPY     10/09/13, then again 3/15     No outpatient medications have been marked as taking for the 02/04/20 encounter (Appointment) with Minna Merritts, MD.     Allergies:   Penicillins, Sulfa antibiotics, and Azithromycin   Social History   Tobacco Use  . Smoking status: Former Smoker    Quit date: 09/26/1993    Years since quitting: 26.3  . Smokeless tobacco: Never Used  . Tobacco comment: quit in 1996  Substance Use Topics  . Alcohol use: Yes    Alcohol/week: 0.0 - 1.0 standard drinks  . Drug use: No     Current Outpatient Medications on File Prior to Visit  Medication Sig Dispense Refill  .  celecoxib (CELEBREX) 200 MG capsule TAKE 1 CAPSULE BY MOUTH ONCE DAILY AS NEEDED FOR MILD TO MODERATE PAIN 30 capsule 5  . diclofenac sodium (VOLTAREN) 1 % GEL Apply 4 g topically 4 (four) times daily. (Patient taking differently: Apply 4 g topically 4 (four) times daily as needed. ) 100 g 2  . fluticasone (VERAMYST) 27.5 MCG/SPRAY nasal spray Place 2 sprays into the nose daily as needed for allergies.     . furosemide (LASIX) 20 MG tablet Take 1 tablet (20 mg total) by mouth daily as needed. 30 tablet 3  . glimepiride (AMARYL) 1 MG tablet TAKE 1 TABLET EVERY DAY WITH BREAKFAST 90 tablet 3  . losartan-hydrochlorothiazide (HYZAAR)  100-12.5 MG tablet TAKE 1 TABLET EVERY DAY 90 tablet 3  . metoprolol succinate (TOPROL-XL) 25 MG 24 hr tablet TAKE 1 TABLET (25 MG TOTAL) BY MOUTH DAILY. 90 tablet 3  . mirabegron ER (MYRBETRIQ) 25 MG TB24 tablet Take 1 tablet (25 mg total) by mouth daily. One po qd (Patient taking differently: Take 25 mg by mouth daily as needed. One po qd) 30 tablet 2  . Multiple Vitamin (MULTIVITAMIN) tablet Take 1 tablet by mouth daily.    Marland Kitchen nystatin cream (MYCOSTATIN) Apply 1 application topically 2 (two) times daily. Apply to affected area BID for up to 7 days. (Patient taking differently: Apply 1 application topically 2 (two) times daily. Apply to affected area BID PRN) 30 g 1  . pioglitazone (ACTOS) 45 MG tablet TAKE 1 TABLET EVERY DAY 90 tablet 3  . potassium chloride SA (K-DUR,KLOR-CON) 20 MEQ tablet Take 1 tablet (20 mEq total) by mouth daily as needed. 30 tablet 3  . XARELTO 20 MG TABS tablet TAKE 1 TABLET (20 MG TOTAL) BY MOUTH DAILY WITH SUPPER. 90 tablet 3   No current facility-administered medications on file prior to visit.     Family Hx: The patient's family history includes Diabetes in her father and another family member; Pancreatic cancer in her father.  ROS:   Please see the history of present illness.    Review of Systems  Constitutional: Negative.   HENT: Negative.   Respiratory: Negative.   Cardiovascular: Negative.   Gastrointestinal: Negative.   Musculoskeletal: Negative.   Neurological: Negative.   Psychiatric/Behavioral: Negative.   All other systems reviewed and are negative.    Labs/Other Tests and Data Reviewed:    Recent Labs: 10/09/2019: ALT 20; BUN 16; Creatinine, Ser 0.79; Hemoglobin 12.5; Platelets 260.0; Potassium 4.0; Sodium 138   Recent Lipid Panel Lab Results  Component Value Date/Time   CHOL 173 10/09/2019 09:10 AM   TRIG 170.0 (H) 10/09/2019 09:10 AM   HDL 48.10 10/09/2019 09:10 AM   CHOLHDL 4 10/09/2019 09:10 AM   LDLCALC 91 10/09/2019 09:10 AM     Wt Readings from Last 3 Encounters:  10/23/19 206 lb (93.4 kg)  10/09/19 204 lb 4 oz (92.6 kg)  03/28/19 203 lb (92.1 kg)     Exam:    Vital Signs: Vital signs may also be detailed in the HPI LMP 09/26/1997   Wt Readings from Last 3 Encounters:  10/23/19 206 lb (93.4 kg)  10/09/19 204 lb 4 oz (92.6 kg)  03/28/19 203 lb (92.1 kg)   Temp Readings from Last 3 Encounters:  10/23/19 (!) 97.5 F (36.4 C)  10/09/19 98.3 F (36.8 C) (Temporal)  03/28/19 97.8 F (36.6 C) (Temporal)   BP Readings from Last 3 Encounters:  10/23/19 126/82  10/09/19 140/82  03/28/19 Marland Kitchen)  144/76   Pulse Readings from Last 3 Encounters:  10/23/19 75  10/09/19 83  03/28/19 83     Well nourished, well developed female in no acute distress. Constitutional:  oriented to person, place, and time. No distress.    ASSESSMENT & PLAN:    PAF (paroxysmal atrial fibrillation) (HCC) - Stable, no tachycardia On xarelto  Type 2 diabetes, controlled, with peripheral neuropathy ([place]) - Numbers are elevated Eating more, no exercise  Essential hypertension - Blood pressure is well controlled on today's visit. No changes made to the medications.  Gastroesophageal reflux disease without esophagitis - stable sx    COVID-19 Education: The signs and symptoms of COVID-19 were discussed with the patient and how to seek care for testing (follow up with PCP or arrange E-visit).  The importance of social distancing was discussed today.  Patient Risk:   After full review of this patients clinical status, I feel that they are at least moderate risk at this time.  Time:   Today, I have spent 25 minutes with the patient with telehealth technology discussing the cardiac and medical problems/diagnoses detailed above   10 min spent reviewing the chart prior to patient visit today   Medication Adjustments/Labs and Tests Ordered: Current medicines are reviewed at length with the patient today.  Concerns regarding  medicines are outlined above.   Tests Ordered: No tests ordered   Medication Changes: No changes made   Disposition: Follow-up in 12 months   Signed, Ida Rogue, MD  02/03/2020 10:01 PM    [place] Office [address], [place], [place] [address]

## 2020-02-04 ENCOUNTER — Ambulatory Visit: Payer: Medicare Other | Admitting: Cardiovascular Disease

## 2020-02-06 ENCOUNTER — Other Ambulatory Visit: Payer: Self-pay

## 2020-02-06 ENCOUNTER — Ambulatory Visit: Payer: Medicare Other | Admitting: Physical Therapy

## 2020-02-06 ENCOUNTER — Encounter: Payer: Self-pay | Admitting: Physical Therapy

## 2020-02-06 DIAGNOSIS — R262 Difficulty in walking, not elsewhere classified: Secondary | ICD-10-CM | POA: Diagnosis not present

## 2020-02-06 DIAGNOSIS — R2681 Unsteadiness on feet: Secondary | ICD-10-CM

## 2020-02-06 DIAGNOSIS — M6281 Muscle weakness (generalized): Secondary | ICD-10-CM | POA: Diagnosis not present

## 2020-02-06 DIAGNOSIS — M25561 Pain in right knee: Secondary | ICD-10-CM | POA: Diagnosis not present

## 2020-02-06 NOTE — Therapy (Signed)
[place] MAIN [facility] SERVICES [address] [place], [place], [address] Phone: [phone]   Fax:  [phone]  Physical Therapy Treatment  Patient Details  Name: Angela Bentley MRN: [id number] Date of Birth: Aug 01, 1941 Referring Provider (PT): Meredith Pel, MD   Encounter Date: 02/06/2020  PT End of Session - 02/06/20 1321    Visit Number  7    Number of Visits  17    Date for PT Re-Evaluation  02/19/20    PT Start Time  [id number]    PT Stop Time  1230    PT Time Calculation (min)  46 min    Activity Tolerance  Patient tolerated treatment well;No increased pain    Behavior During Therapy  WFL for tasks assessed/performed       Past Medical History:  Diagnosis Date  . Balance problems   . Brain tumor ([place])    a. Left intraventricular tumor ->stable by 06/2017 MRI. Followed @ [facility].  . Gait disturbance    a. Unsteady on feet/balance difficulty.  . Generalized osteoarthritis of multiple sites   . GERD (gastroesophageal reflux disease)   . History of DVT (deep vein thrombosis) 2016   estrogen and airflights. Rx with xarelto for 3 months  . History of stress test    a. 11/2016 ETT: No ST/T changes.  Performed to assess PAC/PVC burden with activity ->No ectopy noted.  . Hypertension   . Hyperthyroidism    treated briefly in college  . Insomnia   . Obesity   . PAF (paroxysmal atrial fibrillation) ([place])    a. 12/2016 Event Monitor: brief run of PAF-->CHA2DS2VASc = 5--> Xarelto.  . Plantar fasciitis   . Retinal tear of left eye   . Rosacea   . Type 2 diabetes, controlled, with peripheral neuropathy ([place])    a. 11/2016 A1c 7.4.  . Uterine fibroid     Past Surgical History:  Procedure Laterality Date  . CARPAL TUNNEL RELEASE Right    Dr Burney Gauze  . CATARACT EXTRACTION    . RETINAL TEAR REPAIR CRYOTHERAPY     10/09/13, then again 3/15    There were no vitals filed for this visit.  Subjective Assessment - 02/06/20 1148    Subjective  Patient reports increased fatigue as a result of having issues with spouse (he is having foot surgery). She is feeling better after having stomach issues last week. She is having intermittent back pain which is worse in the morning.    Pertinent History  79 yo Female reports increased right knee pain since December 2020. She was found to have right meniscus tear. She is hoping to treat conservatively and avoid surgery; She did get cortisone injection in January, with no significant relief. She did get gel injection approximately 4 weeks ago and reports just now feeling relief from that. She reports increased RLE lateral thigh pain. She reports pain does prevent sleeping with patient having trouble getting in a comfortable position. In addition she also reports increased posterior LLE knee discomfort either from piriformis syndrome or possible cyst in LLE knee. She presents to therapy without AD but does report decreased activity level due to fatigue from not sleeping well. She reports only sleeping for 3-4 hours at the most and then will have to get up and move around. She has been walking the dog for short walks. She admits she has not been exercising much and reports feeling out of shape. PMH: significant for DMx2 (controlled), HTN (controlled), chronic  dizziness/impaired vision in left eye, imbalance, A-fib (on blood thinner),    How long can you sit comfortably?  variable; 10 min or 1 hour    How long can you stand comfortably?  Short time    How long can you walk comfortably?  0.25 miles    Diagnostic tests  MRI in March 2020 shows Horizontal tear anterior body of the medial meniscus    Patient Stated Goals  "Improving standing position, reduce knee valgus position, reduce pain especially with improving sleep, increase endurance    Currently in Pain?  Yes    Pain Score  4     Pain Location  Back    Pain Orientation  Lower    Pain Descriptors / Indicators  Aching;Sore    Pain Type   Chronic pain    Pain Onset  More than a month ago    Pain Frequency  Intermittent    Aggravating Factors   worse in the morning;    Pain Relieving Factors  rest/heat    Effect of Pain on Daily Activities  decreased activity tolerance    Multiple Pain Sites  No    Pain Onset  1 to 4 weeks ago          TREATMENT: Warm up on Nustep, concurrent with moist heat to low back, BUE/BLE level 2 x4 min (unbilled);   Leg press: BLE 40# 2x15 with cues for proper positioning; Patient able to exhibit good control without pain or discomfort;  Standing with red tband around BLE: Side stepping x10 feet x 2 laps each direction with min VCS to avoid stooping, increase core stabilization and improve foot placement for better hip abductor strengthening;  Band around BLE above knee: Diagonal walking forward/backward x10 feet x 2 laps each with supervision and min VCS to keep slight bend in knee for better stabilization;  RLE green tband terminal knee extension x15 reps with mod VCs for proper positioning and to isolate single leg;   Patient supine: PT assessed LE quad activation with good initiation and motor control noted; Patient does exhibit increased tightness along right distal IT band;  PT Performed soft/deep tissue massage, utilizing edge tool for IASTM to improve tissue extensibility and reduce tightness x10 min; Patient tolerated well exhibiting improved tissue mobility with less tenderness noted;  Patient tolerated session well. She reports increased flexibility with less tightness.Provided patient with aquatic exercise program to help with knee/hip discomfort as patient states pool will be opening soon and she is interested in pool exercise.  Patient denies any pain at end of session;                        PT Education - 02/06/20 1320    Education Details  HEP/positioning, strengthening;    Person(s) Educated  Patient    Methods  Explanation;Verbal cues     Comprehension  Verbalized understanding;Returned demonstration;Verbal cues required;Need further instruction       PT Short Term Goals - 12/25/19 0816      PT SHORT TERM GOAL #1   Title  Patient will be adherent to HEP at least 3x a week to improve functional strength and balance for better safety at home.    Time  4    Period  Weeks    Status  New    Target Date  01/22/20      PT SHORT TERM GOAL #2   Title  Patient will increase RLE knee AROM  extension to within 5 degrees of neutral for better standing and walking tolerance;     Time  4    Period  Weeks    Status  New    Target Date  01/22/20      PT SHORT TERM GOAL #3   Title  Patient will reports maximum of 1 sleep disturbances per night over last 3 days to exhibit improved sleeping tolerance with less knee pain and reduced fatigue.     Time  4    Period  Weeks    Status  New    Target Date  01/22/20        PT Long Term Goals - 12/25/19 0817      PT LONG TERM GOAL #1   Title  Patient will increase BLE gross strength to 4+/5 as to improve functional strength for independent gait, increased standing tolerance and increased ADL ability.    Time  8    Period  Weeks    Status  New    Target Date  02/19/20      PT LONG TERM GOAL #2   Title  Patient will improve FOTO score to >55/100 to indicate improved functional mobility with ADLs.    Time  8    Period  Weeks    Status  New    Target Date  02/19/20      PT LONG TERM GOAL #3   Title  Patient will report a worst pain of 3/10 on VAS in      right knee       to improve tolerance with ADLs and reduced symptoms with activities.     Time  8    Period  Weeks    Status  New    Target Date  02/19/20      PT LONG TERM GOAL #4   Title  Patient will complete >1000 feet on 6 min walk test without increase in knee pain to improve community ambulator distance for functional tasks and return to PLOF.    Time  8    Period  Weeks    Status  New    Target Date  02/19/20      PT  LONG TERM GOAL #5   Title  Patient (> 17 years old) will complete five times sit to stand test in < 15 seconds indicating an increased LE strength and improved balance.    Time  8    Period  Weeks    Status  New    Target Date  02/19/20            Plan - 02/06/20 1323    Clinical Impression Statement  Patient motivated and participated well within session. She was instructed in advanced LE strengthening exercise focusing on quad control and hip abductor strengthening. Patient does require min Vcs for proper exercise technique for optimal muscle activation and motor control. She did exhibit tightness along distal IT band, which was relieved with manual therapy. patient would benefit from additional skilled PT intervention to improve strength, balance and mobility while reducing knee pain;    Personal Factors and Comorbidities  Age;Comorbidity 3+;Past/Current Experience    Comorbidities  PMH: significant for DMx2 (controlled), HTN (controlled), chronic dizziness/impaired vision in left eye, imbalance, A-fib (on blood thinner),    Examination-Activity Limitations  Bed Mobility;Caring for Others;Lift;Locomotion Level;Sit;Sleep;Squat;Stairs;Stand;Transfers    Examination-Participation Restrictions  Church;Cleaning;Community Activity;Shop;Volunteer;Yard Work    Database administrator  Good    PT Frequency  2x / week    PT Duration  8 weeks    PT Treatment/Interventions  ADLs/Self Care Home Management;Cryotherapy;Electrical Stimulation;Moist Heat;Gait training;Stair training;Functional mobility training;Therapeutic activities;Therapeutic exercise;Balance training;Neuromuscular re-education;Patient/family education;Manual techniques;Passive range of motion;Dry needling;Energy conservation;Taping    PT Next Visit Plan  assess strength, 6 min walk test, initiate HEP    PT Home Exercise Plan  will address next session;    Consulted and  Agree with Plan of Care  Patient       Patient will benefit from skilled therapeutic intervention in order to improve the following deficits and impairments:  Abnormal gait, Decreased balance, Decreased endurance, Decreased mobility, Difficulty walking, Improper body mechanics, Decreased range of motion, Decreased activity tolerance, Decreased strength, Pain  Visit Diagnosis: Acute pain of right knee  Muscle weakness (generalized)  Unsteadiness on feet  Difficulty in walking, not elsewhere classified     Problem List Patient Active Problem List   Diagnosis Date Noted  . Left-sided chest pain 10/23/2019  . Overactive bladder 10/09/2019  . Lumbar sprain 03/28/2019  . Tinnitus, bilateral 11/28/2017  . Abnormal MRI of head 11/14/2017  . Meningioma ([place]) 09/21/2017  . PAF (paroxysmal atrial fibrillation) ([place]) 02/03/2017  . Preventative health care 06/02/2016  . Advance directive discussed with patient 06/02/2016  . Achilles tendonitis 03/11/2016  . Hypertension   . DVT, lower extremity, recurrent ([place])   . Type 2 diabetes, controlled, with peripheral neuropathy ([place])   . Generalized osteoarthritis of multiple sites   . GERD (gastroesophageal reflux disease)   . Hyperthyroidism   . Retinal hole of right eye 06/26/2014  . Amblyopia, right 05/08/2014  . Status post intraocular lens implant 05/08/2014    Lurlean Kernen PT, DPT 02/06/2020, 1:37 PM  [place] MAIN [facility] SERVICES [address] [place], [place], [address] Phone: [phone]   Fax:  [phone]  Name: Angela Bentley MRN: [id number] Date of Birth: 09/28/1940

## 2020-02-11 ENCOUNTER — Ambulatory Visit: Payer: Medicare Other | Admitting: Physical Therapy

## 2020-02-13 ENCOUNTER — Ambulatory Visit: Payer: Medicare Other | Admitting: Physical Therapy

## 2020-02-18 ENCOUNTER — Other Ambulatory Visit: Payer: Self-pay

## 2020-02-18 ENCOUNTER — Ambulatory Visit: Payer: Medicare Other | Admitting: Physical Therapy

## 2020-02-18 ENCOUNTER — Encounter: Payer: Self-pay | Admitting: Physical Therapy

## 2020-02-18 DIAGNOSIS — M6281 Muscle weakness (generalized): Secondary | ICD-10-CM

## 2020-02-18 DIAGNOSIS — M25561 Pain in right knee: Secondary | ICD-10-CM

## 2020-02-18 DIAGNOSIS — R2681 Unsteadiness on feet: Secondary | ICD-10-CM | POA: Diagnosis not present

## 2020-02-18 DIAGNOSIS — R262 Difficulty in walking, not elsewhere classified: Secondary | ICD-10-CM | POA: Diagnosis not present

## 2020-02-18 NOTE — Therapy (Signed)
[place] MAIN [facility] SERVICES [address] [place], [place], [address] Phone: [phone]   Fax:  [phone]  Physical Therapy Treatment  Patient Details  Name: Angela Bentley MRN: [id number] Date of Birth: 17-May-1941 Referring Provider (PT): Meredith Pel, MD   Encounter Date: 02/18/2020  PT End of Session - 02/18/20 1112    Visit Number  8    Number of Visits  17    Date for PT Re-Evaluation  03/17/20    PT Start Time  1110    PT Stop Time  1150    PT Time Calculation (min)  40 min    Activity Tolerance  Patient tolerated treatment well;No increased pain    Behavior During Therapy  WFL for tasks assessed/performed       Past Medical History:  Diagnosis Date  . Balance problems   . Brain tumor ([place])    a. Left intraventricular tumor ->stable by 06/2017 MRI. Followed @ [facility].  . Gait disturbance    a. Unsteady on feet/balance difficulty.  . Generalized osteoarthritis of multiple sites   . GERD (gastroesophageal reflux disease)   . History of DVT (deep vein thrombosis) 2016   estrogen and airflights. Rx with xarelto for 3 months  . History of stress test    a. 11/2016 ETT: No ST/T changes.  Performed to assess PAC/PVC burden with activity ->No ectopy noted.  . Hypertension   . Hyperthyroidism    treated briefly in college  . Insomnia   . Obesity   . PAF (paroxysmal atrial fibrillation) ([place])    a. 12/2016 Event Monitor: brief run of PAF-->CHA2DS2VASc = 5--> Xarelto.  . Plantar fasciitis   . Retinal tear of left eye   . Rosacea   . Type 2 diabetes, controlled, with peripheral neuropathy ([place])    a. 11/2016 A1c 7.4.  . Uterine fibroid     Past Surgical History:  Procedure Laterality Date  . CARPAL TUNNEL RELEASE Right    Dr Burney Gauze  . CATARACT EXTRACTION    . RETINAL TEAR REPAIR CRYOTHERAPY     10/09/13, then again 3/15    There were no vitals filed for this visit.  Subjective Assessment - 02/18/20 1111    Subjective  Patient reports increased fatigue after caring for her husband after his foot surgery; She reports not recovering yet from lack of sleep and feeling some unsteadiness and trouble with vision; Pt reports feeling like her knee is doing better; She reports not walking as much due to heat.    Pertinent History  79 yo Female reports increased right knee pain since December 2020. She was found to have right meniscus tear. She is hoping to treat conservatively and avoid surgery; She did get cortisone injection in January, with no significant relief. She did get gel injection approximately 4 weeks ago and reports just now feeling relief from that. She reports increased RLE lateral thigh pain. She reports pain does prevent sleeping with patient having trouble getting in a comfortable position. In addition she also reports increased posterior LLE knee discomfort either from piriformis syndrome or possible cyst in LLE knee. She presents to therapy without AD but does report decreased activity level due to fatigue from not sleeping well. She reports only sleeping for 3-4 hours at the most and then will have to get up and move around. She has been walking the dog for short walks. She admits she has not been exercising much and reports feeling out of  shape. PMH: significant for DMx2 (controlled), HTN (controlled), chronic dizziness/impaired vision in left eye, imbalance, A-fib (on blood thinner),    How long can you sit comfortably?  variable; 10 min or 1 hour    How long can you stand comfortably?  Short time    How long can you walk comfortably?  0.25 miles    Diagnostic tests  MRI in March 2020 shows Horizontal tear anterior body of the medial meniscus    Patient Stated Goals  "Improving standing position, reduce knee valgus position, reduce pain especially with improving sleep, increase endurance    Currently in Pain?  No/denies    Pain Onset  More than a month ago    Multiple Pain Sites  No    Pain  Onset  1 to 4 weeks ago         [facility] PT Assessment - 02/18/20 0001      Observation/Other Assessments   Focus on Therapeutic Outcomes (FOTO)   50/100      Strength   Right Hip Flexion  4+/5    Right Hip Extension  4/5    Right Hip ABduction  4-/5    Left Hip Flexion  4+/5    Left Hip Extension  4-/5    Left Hip ABduction  4-/5    Right Knee Flexion  4+/5    Right Knee Extension  4+/5    Left Knee Flexion  4+/5    Left Knee Extension  4+/5      6 minute walk test results    Aerobic Endurance Distance Walked  1150    Endurance additional comments  with increased shortness of breath; SPO2 100%, HR 103 bpm after gait      Standardized Balance Assessment   Five times sit to stand comments   15.14 sec without HHA slight risk for falls         TREATMENT: Warm up on Crosstrainer level 2 x4 min (unbilled)  PT instructed patient in outcome measures including 6 min walk, 5 times sit<>stand, assessed strength etc to address goals, see above;  Patient does exhibit improved LE strength but continues to have some weakness in LE hip;   She also exhibits significant improvement in 6 min walk progressing from 1000 feet to 1150;  She continues to have difficulty with higher level functional tasks being unable to walk a mile, difficulty climbing stairs etc. Plan to progress skilled intervention to LE strengthening and higher level functional tasks. Patient verbalized understanding;                    PT Education - 02/18/20 1112    Education Details  HEP/positioning, strengthening;    Person(s) Educated  Patient    Methods  Explanation;Verbal cues    Comprehension  Verbalized understanding;Returned demonstration;Verbal cues required;Need further instruction       PT Short Term Goals - 02/18/20 1113      PT SHORT TERM GOAL #1   Title  Patient will be adherent to HEP at least 3x a week to improve functional strength and balance for better safety at home.     Baseline  5/25: not done much this last week;    Time  4    Period  Weeks    Status  Not Met    Target Date  03/17/20      PT SHORT TERM GOAL #2   Title  Patient will increase RLE knee AROM extension to within 5 degrees  of neutral for better standing and walking tolerance;     Time  4    Period  Weeks    Status  Partially Met    Target Date  03/17/20      PT SHORT TERM GOAL #3   Title  Patient will reports maximum of 1 sleep disturbances per night over last 3 days to exhibit improved sleeping tolerance with less knee pain and reduced fatigue.     Baseline  5/25: once or less, not very often;    Time  4    Period  Weeks    Status  Achieved    Target Date  01/22/20        PT Long Term Goals - 02/18/20 1114      PT LONG TERM GOAL #1   Title  Patient will increase BLE gross strength to 4+/5 as to improve functional strength for independent gait, increased standing tolerance and increased ADL ability.    Baseline  5/25: see flowsheet    Time  4    Period  Weeks    Status  Partially Met    Target Date  03/17/20      PT LONG TERM GOAL #2   Title  Patient will improve FOTO score to >55/100 to indicate improved functional mobility with ADLs.    Baseline  4/7: 51/100, 5/25: 50/100    Time  4    Period  Weeks    Status  Not Met    Target Date  03/17/20      PT LONG TERM GOAL #3   Title  Patient will report a worst pain of 3/10 on VAS in      right knee       to improve tolerance with ADLs and reduced symptoms with activities.     Baseline  5/25: 3-4/10 in right knee;    Time  4    Period  Weeks    Status  Partially Met    Target Date  03/17/20      PT LONG TERM GOAL #4   Title  Patient will complete >1000 feet on 6 min walk test without increase in knee pain to improve community ambulator distance for functional tasks and return to PLOF.    Baseline  5/25: 1150 feet    Time  4    Period  Weeks    Status  Achieved    Target Date  03/17/20      PT LONG TERM GOAL #5    Title  Patient (> 92 years old) will complete five times sit to stand test in < 15 seconds indicating an increased LE strength and improved balance.    Baseline  5/25: 15.15 sec    Time  4    Period  Weeks    Status  Partially Met    Target Date  03/17/20            Plan - 02/18/20 1208    Clinical Impression Statement  Patient motivated and participated well within session. She reports minimal knee pain over last week. She also exhibits improved LE strength and mobility as evidenced by increased distance on 6 min walk and improved 5 times sit<>Stand. She does continue to have mild weakness in LE and is limited in functional mobility with difficulty with long distance walking, steps etc. She would benefit from additional skilled PT intervention to improve LE strength and functional mobility for return to PLOF.    Personal Factors  and Comorbidities  Age;Comorbidity 3+;Past/Current Experience    Comorbidities  PMH: significant for DMx2 (controlled), HTN (controlled), chronic dizziness/impaired vision in left eye, imbalance, A-fib (on blood thinner),    Examination-Activity Limitations  Bed Mobility;Caring for Others;Lift;Locomotion Level;Sit;Sleep;Squat;Stairs;Stand;Transfers    Examination-Participation Restrictions  Church;Cleaning;Community Activity;Shop;Volunteer;Yard Work    Merchant navy officer  Evolving/Moderate complexity    Rehab Potential  Good    PT Frequency  2x / week    PT Duration  4 weeks    PT Treatment/Interventions  ADLs/Self Care Home Management;Cryotherapy;Electrical Stimulation;Moist Heat;Gait training;Stair training;Functional mobility training;Therapeutic activities;Therapeutic exercise;Balance training;Neuromuscular re-education;Patient/family education;Manual techniques;Passive range of motion;Dry needling;Energy conservation;Taping    PT Next Visit Plan  assess strength, 6 min walk test, initiate HEP    PT Home Exercise Plan  will address next session;     Consulted and Agree with Plan of Care  Patient       Patient will benefit from skilled therapeutic intervention in order to improve the following deficits and impairments:  Abnormal gait, Decreased balance, Decreased endurance, Decreased mobility, Difficulty walking, Improper body mechanics, Decreased range of motion, Decreased activity tolerance, Decreased strength, Pain  Visit Diagnosis: Acute pain of right knee  Muscle weakness (generalized)     Problem List Patient Active Problem List   Diagnosis Date Noted  . Left-sided chest pain 10/23/2019  . Overactive bladder 10/09/2019  . Lumbar sprain 03/28/2019  . Tinnitus, bilateral 11/28/2017  . Abnormal MRI of head 11/14/2017  . Meningioma ([place]) 09/21/2017  . PAF (paroxysmal atrial fibrillation) ([place]) 02/03/2017  . Preventative health care 06/02/2016  . Advance directive discussed with patient 06/02/2016  . Achilles tendonitis 03/11/2016  . Hypertension   . DVT, lower extremity, recurrent ([place])   . Type 2 diabetes, controlled, with peripheral neuropathy ([place])   . Generalized osteoarthritis of multiple sites   . GERD (gastroesophageal reflux disease)   . Hyperthyroidism   . Retinal hole of right eye 06/26/2014  . Amblyopia, right 05/08/2014  . Status post intraocular lens implant 05/08/2014    Stevee Valenta PT, DPT 02/18/2020, 12:10 PM  [place] MAIN [facility] SERVICES [address] [place], [place], [address] Phone: [phone]   Fax:  [phone]  Name: Dayanara Sherrill MRN: [id number] Date of Birth: 1941/04/23

## 2020-02-18 NOTE — Addendum Note (Signed)
Addended by: Blanche East E on: 02/18/2020 12:13 PM   Modules accepted: Orders

## 2020-02-20 ENCOUNTER — Other Ambulatory Visit: Payer: Self-pay

## 2020-02-20 ENCOUNTER — Ambulatory Visit: Payer: Medicare Other | Admitting: Physical Therapy

## 2020-02-20 ENCOUNTER — Encounter: Payer: Self-pay | Admitting: Physical Therapy

## 2020-02-20 DIAGNOSIS — M6281 Muscle weakness (generalized): Secondary | ICD-10-CM | POA: Diagnosis not present

## 2020-02-20 DIAGNOSIS — R262 Difficulty in walking, not elsewhere classified: Secondary | ICD-10-CM | POA: Diagnosis not present

## 2020-02-20 DIAGNOSIS — R2681 Unsteadiness on feet: Secondary | ICD-10-CM | POA: Diagnosis not present

## 2020-02-20 DIAGNOSIS — M25561 Pain in right knee: Secondary | ICD-10-CM

## 2020-02-20 NOTE — Therapy (Signed)
[place] MAIN [facility] SERVICES [address] [place], [place], [address] Phone: [phone]   Fax:  [phone]  Physical Therapy Treatment  Patient Details  Name: Angela Bentley MRN: [id number] Date of Birth: 1940/12/08 Referring Provider (PT): Meredith Pel, MD   Encounter Date: 02/20/2020  PT End of Session - 02/20/20 1148    Visit Number  9    Number of Visits  17    Date for PT Re-Evaluation  03/17/20    PT Start Time  4709    PT Stop Time  1230    PT Time Calculation (min)  45 min    Activity Tolerance  Patient tolerated treatment well;No increased pain    Behavior During Therapy  WFL for tasks assessed/performed       Past Medical History:  Diagnosis Date  . Balance problems   . Brain tumor ([place])    a. Left intraventricular tumor ->stable by 06/2017 MRI. Followed @ [facility].  . Gait disturbance    a. Unsteady on feet/balance difficulty.  . Generalized osteoarthritis of multiple sites   . GERD (gastroesophageal reflux disease)   . History of DVT (deep vein thrombosis) 2016   estrogen and airflights. Rx with xarelto for 3 months  . History of stress test    a. 11/2016 ETT: No ST/T changes.  Performed to assess PAC/PVC burden with activity ->No ectopy noted.  . Hypertension   . Hyperthyroidism    treated briefly in college  . Insomnia   . Obesity   . PAF (paroxysmal atrial fibrillation) ([place])    a. 12/2016 Event Monitor: brief run of PAF-->CHA2DS2VASc = 5--> Xarelto.  . Plantar fasciitis   . Retinal tear of left eye   . Rosacea   . Type 2 diabetes, controlled, with peripheral neuropathy ([place])    a. 11/2016 A1c 7.4.  . Uterine fibroid     Past Surgical History:  Procedure Laterality Date  . CARPAL TUNNEL RELEASE Right    Dr Burney Gauze  . CATARACT EXTRACTION    . RETINAL TEAR REPAIR CRYOTHERAPY     10/09/13, then again 3/15    There were no vitals filed for this visit.  Subjective Assessment - 02/20/20 1147    Pertinent History  79 yo Female reports increased right knee pain since December 2020. She was found to have right meniscus tear. She is hoping to treat conservatively and avoid surgery; She did get cortisone injection in January, with no significant relief. She did get gel injection approximately 4 weeks ago and reports just now feeling relief from that. She reports increased RLE lateral thigh pain. She reports pain does prevent sleeping with patient having trouble getting in a comfortable position. In addition she also reports increased posterior LLE knee discomfort either from piriformis syndrome or possible cyst in LLE knee. She presents to therapy without AD but does report decreased activity level due to fatigue from not sleeping well. She reports only sleeping for 3-4 hours at the most and then will have to get up and move around. She has been walking the dog for short walks. She admits she has not been exercising much and reports feeling out of shape. PMH: significant for DMx2 (controlled), HTN (controlled), chronic dizziness/impaired vision in left eye, imbalance, A-fib (on blood thinner),    How long can you sit comfortably?  variable; 10 min or 1 hour    How long can you stand comfortably?  Short time    How long can  you walk comfortably?  0.25 miles    Diagnostic tests  MRI in March 2020 shows Horizontal tear anterior body of the medial meniscus    Patient Stated Goals  "Improving standing position, reduce knee valgus position, reduce pain especially with improving sleep, increase endurance    Pain Onset  More than a month ago    Pain Onset  1 to 4 weeks ago       TREATMENT: PT inspected skin on thoracic spine with raised mole approximately penny size, multi-colored; Recommended patient follow up with MD for assessment, especially since she is feeling pain;   Warm up on crosstrainer level 2 x4 min (unbilled);    Leg press: BLE 40# 2x15 with cues for proper positioning; Patient able to  exhibit good control without pain or discomfort;   Standing with red tband around BLE: Side stepping x10 feet x 2 laps each direction with min VCS to avoid stooping, increase core stabilization and improve foot placement for better hip abductor strengthening;   Red Band around BLE above knee: Diagonal walking forward/backward x10 feet x 2 laps each with supervision and min VCS to keep slight bend in knee for better stabilization;   Side step up and lower x10 reps with cues for proper positioning and to slow down LE movement for better motor control;      Patient tolerated session well. She reports increased flexibility with less tightness .Reinforced HEP, advancing with step ups. Patient denies any increase in pain except for left shoulder; Concerned about moles on thoracic spine; Recommended patient follow up with MD for assessment;      Patient denies any pain at end of session;                          PT Education - 02/20/20 1148    Education Details  LE strengthening, HEP, positioning;    Person(s) Educated  Patient    Methods  Explanation;Verbal cues    Comprehension  Need further instruction;Verbalized understanding;Returned demonstration;Verbal cues required       PT Short Term Goals - 02/18/20 1113      PT SHORT TERM GOAL #1   Title  Patient will be adherent to HEP at least 3x a week to improve functional strength and balance for better safety at home.    Baseline  5/25: not done much this last week;    Time  4    Period  Weeks    Status  Not Met    Target Date  03/17/20      PT SHORT TERM GOAL #2   Title  Patient will increase RLE knee AROM extension to within 5 degrees of neutral for better standing and walking tolerance;     Time  4    Period  Weeks    Status  Partially Met    Target Date  03/17/20      PT SHORT TERM GOAL #3   Title  Patient will reports maximum of 1 sleep disturbances per night over last 3 days to exhibit improved sleeping  tolerance with less knee pain and reduced fatigue.     Baseline  5/25: once or less, not very often;    Time  4    Period  Weeks    Status  Achieved    Target Date  01/22/20        PT Long Term Goals - 02/18/20 1114      PT LONG  TERM GOAL #1   Title  Patient will increase BLE gross strength to 4+/5 as to improve functional strength for independent gait, increased standing tolerance and increased ADL ability.    Baseline  5/25: see flowsheet    Time  4    Period  Weeks    Status  Partially Met    Target Date  03/17/20      PT LONG TERM GOAL #2   Title  Patient will improve FOTO score to >55/100 to indicate improved functional mobility with ADLs.    Baseline  4/7: 51/100, 5/25: 50/100    Time  4    Period  Weeks    Status  Not Met    Target Date  03/17/20      PT LONG TERM GOAL #3   Title  Patient will report a worst pain of 3/10 on VAS in      right knee       to improve tolerance with ADLs and reduced symptoms with activities.     Baseline  5/25: 3-4/10 in right knee;    Time  4    Period  Weeks    Status  Partially Met    Target Date  03/17/20      PT LONG TERM GOAL #4   Title  Patient will complete >1000 feet on 6 min walk test without increase in knee pain to improve community ambulator distance for functional tasks and return to PLOF.    Baseline  5/25: 1150 feet    Time  4    Period  Weeks    Status  Achieved    Target Date  03/17/20      PT LONG TERM GOAL #5   Title  Patient (> 55 years old) will complete five times sit to stand test in < 15 seconds indicating an increased LE strength and improved balance.    Baseline  5/25: 15.15 sec    Time  4    Period  Weeks    Status  Partially Met    Target Date  03/17/20            Plan - 02/20/20 1443    Clinical Impression Statement  Patient motivated and participated well within session; She was instructed in advanced LE strengthening. Pt requires min VCS for proper positioning and exercise technique; She  denies any increase in pain with advanced exercise. She did complain of left shoulder pain with papation of suspicious mole on thoracic spine. Recommended patient follow up with MD regarding mole as soon as possible. She would benefit from additional skilled PT intervention to improve strength, balance and mobility;    Personal Factors and Comorbidities  Age;Comorbidity 3+;Past/Current Experience    Comorbidities  PMH: significant for DMx2 (controlled), HTN (controlled), chronic dizziness/impaired vision in left eye, imbalance, A-fib (on blood thinner),    Examination-Activity Limitations  Bed Mobility;Caring for Others;Lift;Locomotion Level;Sit;Sleep;Squat;Stairs;Stand;Transfers    Examination-Participation Restrictions  Church;Cleaning;Community Activity;Shop;Volunteer;Yard Work    Merchant navy officer  Evolving/Moderate complexity    Rehab Potential  Good    PT Frequency  2x / week    PT Duration  4 weeks    PT Treatment/Interventions  ADLs/Self Care Home Management;Cryotherapy;Electrical Stimulation;Moist Heat;Gait training;Stair training;Functional mobility training;Therapeutic activities;Therapeutic exercise;Balance training;Neuromuscular re-education;Patient/family education;Manual techniques;Passive range of motion;Dry needling;Energy conservation;Taping    PT Next Visit Plan  assess strength, 6 min walk test, initiate HEP    PT Home Exercise Plan  will address next session;  Consulted and Agree with Plan of Care  Patient       Patient will benefit from skilled therapeutic intervention in order to improve the following deficits and impairments:  Abnormal gait, Decreased balance, Decreased endurance, Decreased mobility, Difficulty walking, Improper body mechanics, Decreased range of motion, Decreased activity tolerance, Decreased strength, Pain  Visit Diagnosis: Acute pain of right knee  Muscle weakness (generalized)     Problem List Patient Active Problem List    Diagnosis Date Noted  . Left-sided chest pain 10/23/2019  . Overactive bladder 10/09/2019  . Lumbar sprain 03/28/2019  . Tinnitus, bilateral 11/28/2017  . Abnormal MRI of head 11/14/2017  . Meningioma ([place]) 09/21/2017  . PAF (paroxysmal atrial fibrillation) ([place]) 02/03/2017  . Preventative health care 06/02/2016  . Advance directive discussed with patient 06/02/2016  . Achilles tendonitis 03/11/2016  . Hypertension   . DVT, lower extremity, recurrent ([place])   . Type 2 diabetes, controlled, with peripheral neuropathy ([place])   . Generalized osteoarthritis of multiple sites   . GERD (gastroesophageal reflux disease)   . Hyperthyroidism   . Retinal hole of right eye 06/26/2014  . Amblyopia, right 05/08/2014  . Status post intraocular lens implant 05/08/2014    Maebell Lyvers PT, DPT 02/20/2020, 2:45 PM  [place] MAIN [facility] SERVICES [address] [place], [place], [address] Phone: [phone]   Fax:  [phone]  Name: Bianka Liberati MRN: [id number] Date of Birth: 19-Mar-1941

## 2020-02-21 ENCOUNTER — Ambulatory Visit (INDEPENDENT_AMBULATORY_CARE_PROVIDER_SITE_OTHER): Payer: Medicare Other | Admitting: Internal Medicine

## 2020-02-21 ENCOUNTER — Encounter: Payer: Self-pay | Admitting: Internal Medicine

## 2020-02-21 DIAGNOSIS — E119 Type 2 diabetes mellitus without complications: Secondary | ICD-10-CM | POA: Diagnosis not present

## 2020-02-21 DIAGNOSIS — S46812A Strain of other muscles, fascia and tendons at shoulder and upper arm level, left arm, initial encounter: Secondary | ICD-10-CM | POA: Diagnosis not present

## 2020-02-21 DIAGNOSIS — H524 Presbyopia: Secondary | ICD-10-CM | POA: Diagnosis not present

## 2020-02-21 DIAGNOSIS — H18593 Other hereditary corneal dystrophies, bilateral: Secondary | ICD-10-CM | POA: Diagnosis not present

## 2020-02-21 DIAGNOSIS — H04123 Dry eye syndrome of bilateral lacrimal glands: Secondary | ICD-10-CM | POA: Diagnosis not present

## 2020-02-21 LAB — HM DIABETES EYE EXAM

## 2020-02-21 NOTE — Progress Notes (Signed)
Subjective:    Patient ID: Angela Bentley, female    DOB: November 04, 1940, 79 y.o.   MRN: [id number]  HPI Here due to suspicious mole and trouble with her shoulder This visit occurred during the SARS-CoV-2 public health emergency.  Safety protocols were in place, including screening questions prior to the visit, additional usage of staff PPE, and extensive cleaning of exam room while observing appropriate contact time as indicated for disinfecting solutions.   Noticed a mole in the middle of her back that was concerning Hit it with back scratcher--and hurt It is raised and sore Seemed to cause shooting pain into left shoulder--when she scratched it Has had some shoulder pain that has worsened---started 4-5 days ago No known injury Pain on top of left shoulder--but occasionally down arm (extensor side)  Current Outpatient Medications on File Prior to Visit  Medication Sig Dispense Refill  . celecoxib (CELEBREX) 200 MG capsule TAKE 1 CAPSULE BY MOUTH ONCE DAILY AS NEEDED FOR MILD TO MODERATE PAIN 30 capsule 5  . diclofenac sodium (VOLTAREN) 1 % GEL Apply 4 g topically 4 (four) times daily. (Patient taking differently: Apply 4 g topically 4 (four) times daily as needed. ) 100 g 2  . fluticasone (VERAMYST) 27.5 MCG/SPRAY nasal spray Place 2 sprays into the nose daily as needed for allergies.     . furosemide (LASIX) 20 MG tablet Take 1 tablet (20 mg total) by mouth daily as needed. 30 tablet 3  . glimepiride (AMARYL) 1 MG tablet TAKE 1 TABLET EVERY DAY WITH BREAKFAST 90 tablet 3  . losartan-hydrochlorothiazide (HYZAAR) 100-12.5 MG tablet TAKE 1 TABLET EVERY DAY 90 tablet 3  . metoprolol succinate (TOPROL-XL) 25 MG 24 hr tablet TAKE 1 TABLET (25 MG TOTAL) BY MOUTH DAILY. 90 tablet 3  . Multiple Vitamin (MULTIVITAMIN) tablet Take 1 tablet by mouth daily.    Marland Kitchen nystatin cream (MYCOSTATIN) Apply 1 application topically 2 (two) times daily. Apply to affected area BID for up to 7 days. (Patient taking  differently: Apply 1 application topically 2 (two) times daily. Apply to affected area BID PRN) 30 g 1  . pioglitazone (ACTOS) 45 MG tablet TAKE 1 TABLET EVERY DAY 90 tablet 3  . potassium chloride SA (K-DUR,KLOR-CON) 20 MEQ tablet Take 1 tablet (20 mEq total) by mouth daily as needed. 30 tablet 3  . XARELTO 20 MG TABS tablet TAKE 1 TABLET (20 MG TOTAL) BY MOUTH DAILY WITH SUPPER. 90 tablet 3   No current facility-administered medications on file prior to visit.    Allergies  Allergen Reactions  . Penicillins     Has patient had a PCN reaction causing immediate rash, facial/tongue/throat swelling, SOB or lightheadedness with hypotension: Yes Has patient had a PCN reaction causing severe rash involving mucus membranes or skin necrosis: Yes Has patient had a PCN reaction that required hospitalization No Has patient had a PCN reaction occurring within the last 10 years: No If all of the above answers are "NO", then may proceed with Cephalosporin use.    . Sulfa Antibiotics     Rash/itching  . Azithromycin Palpitations    Past Medical History:  Diagnosis Date  . Balance problems   . Brain tumor ([place])    a. Left intraventricular tumor ->stable by 06/2017 MRI. Followed @ [facility].  . Gait disturbance    a. Unsteady on feet/balance difficulty.  . Generalized osteoarthritis of multiple sites   . GERD (gastroesophageal reflux disease)   . History of DVT (deep vein thrombosis)  2016   estrogen and airflights. Rx with xarelto for 3 months  . History of stress test    a. 11/2016 ETT: No ST/T changes.  Performed to assess PAC/PVC burden with activity ->No ectopy noted.  . Hypertension   . Hyperthyroidism    treated briefly in college  . Insomnia   . Obesity   . PAF (paroxysmal atrial fibrillation) ([place])    a. 12/2016 Event Monitor: brief run of PAF-->CHA2DS2VASc = 5--> Xarelto.  . Plantar fasciitis   . Retinal tear of left eye   . Rosacea   . Type 2 diabetes, controlled, with peripheral  neuropathy ([place])    a. 11/2016 A1c 7.4.  . Uterine fibroid     Past Surgical History:  Procedure Laterality Date  . CARPAL TUNNEL RELEASE Right    Dr Burney Gauze  . CATARACT EXTRACTION    . RETINAL TEAR REPAIR CRYOTHERAPY     10/09/13, then again 3/15    Family History  Problem Relation Age of Onset  . Diabetes Father   . Pancreatic cancer Father   . Diabetes Other        grandmother    Social History   Socioeconomic History  . Marital status: Married    Spouse name: Not on file  . Number of children: 3  . Years of education: Not on file  . Highest education level: Not on file  Occupational History  . Occupation: Non Patent attorney    Comment: [employer] and others  Tobacco Use  . Smoking status: Former Smoker    Quit date: 09/26/1993    Years since quitting: 26.4  . Smokeless tobacco: Never Used  . Tobacco comment: quit in 1996  Substance and Sexual Activity  . Alcohol use: Yes    Alcohol/week: 0.0 - 1.0 standard drinks  . Drug use: No  . Sexual activity: Not Currently    Partners: Male  Other Topics Concern  . Not on file  Social History Narrative   Has living will   Husband is health care POA-- or daughter Izora Gala   Would accept resuscitation attempts   Would not want tube feeds if cognitively unaware   Social Determinants of Health   Financial Resource Strain:   . Difficulty of Paying Living Expenses:   Food Insecurity:   . Worried About Charity fundraiser in the Last Year:   . Arboriculturist in the Last Year:   Transportation Needs:   . Film/video editor (Medical):   Marland Kitchen Lack of Transportation (Non-Medical):   Physical Activity:   . Days of Exercise per Week:   . Minutes of Exercise per Session:   Stress:   . Feeling of Stress :   Social Connections:   . Frequency of Communication with Friends and Family:   . Frequency of Social Gatherings with Friends and Family:   . Attends Religious Services:   . Active Member of Clubs or Organizations:   .  Attends Archivist Meetings:   Marland Kitchen Marital Status:   Intimate Partner Violence:   . Fear of Current or Ex-Partner:   . Emotionally Abused:   Marland Kitchen Physically Abused:   . Sexually Abused:    Review of Systems No Rx for shoulder    Objective:   Physical Exam  Musculoskeletal:     Comments: Slight tightness and pain area is medial left trapezius near spine ROM in shoulder is normal. Very slight crepitus. No sig tenderness. Active ROM fairly normal  Skin:  Raised fleshy mole in thoracic back---nothing concerning           Assessment & Plan:

## 2020-02-21 NOTE — Assessment & Plan Note (Signed)
Mild Nothing in shoulder Not related to benign mole Doesn't remember any injury Discussed heat, massage, etc

## 2020-02-25 ENCOUNTER — Encounter: Payer: Self-pay | Admitting: Physical Therapy

## 2020-02-25 ENCOUNTER — Other Ambulatory Visit: Payer: Self-pay

## 2020-02-25 ENCOUNTER — Ambulatory Visit: Payer: Medicare Other | Attending: Orthopedic Surgery | Admitting: Physical Therapy

## 2020-02-25 DIAGNOSIS — R2681 Unsteadiness on feet: Secondary | ICD-10-CM | POA: Insufficient documentation

## 2020-02-25 DIAGNOSIS — R262 Difficulty in walking, not elsewhere classified: Secondary | ICD-10-CM

## 2020-02-25 DIAGNOSIS — M6281 Muscle weakness (generalized): Secondary | ICD-10-CM | POA: Diagnosis present

## 2020-02-25 DIAGNOSIS — M25561 Pain in right knee: Secondary | ICD-10-CM | POA: Diagnosis not present

## 2020-02-25 NOTE — Patient Instructions (Addendum)
Access Code: [email or web address] URL: [email or web address] Date: 02/25/2020 Prepared by: Blanche East  Exercises Seated Levator Scapulae Stretch - 2-3 x daily - 7 x weekly - 1 sets - 3 reps - 15-30 sec hold  Access Code: [email or web address] URL: [email or web address] Date: 02/25/2020 Prepared by: Blanche East  Exercises Standing Hip Extension with Resistance at Ankles and Counter Support - 1 x daily - 7 x weekly - 2 sets - 10 reps Standing Hip Abduction with Resistance at Ankles and Counter Support - 1 x daily - 7 x weekly - 2 sets - 10 reps Side Stepping with Resistance at Thighs - 1 x daily - 7 x weekly - 1 sets - 4-5 reps

## 2020-02-25 NOTE — Therapy (Signed)
[place] MAIN [facility] SERVICES [address] [place], [place], [address] Phone: [phone]   Fax:  [phone]  Physical Therapy Treatment Physical Therapy Progress Note   Dates of reporting period  12/23/19   to   02/25/20   Patient Details  Name: Angela Bentley MRN: [id number] Date of Birth: [birth year] Referring Provider (PT): Meredith Pel, MD   Encounter Date: 02/25/2020  PT End of Session - 02/25/20 1102    Visit Number  10    Number of Visits  17    Date for PT Re-Evaluation  03/17/20    PT Start Time  4920    PT Stop Time  1145    PT Time Calculation (min)  43 min    Activity Tolerance  Patient tolerated treatment well;No increased pain    Behavior During Therapy  WFL for tasks assessed/performed       Past Medical History:  Diagnosis Date  . Balance problems   . Brain tumor ([place])    a. Left intraventricular tumor ->stable by 06/2017 MRI. Followed @ [facility].  . Gait disturbance    a. Unsteady on feet/balance difficulty.  . Generalized osteoarthritis of multiple sites   . GERD (gastroesophageal reflux disease)   . History of DVT (deep vein thrombosis) 2016   estrogen and airflights. Rx with xarelto for 3 months  . History of stress test    a. 11/2016 ETT: No ST/T changes.  Performed to assess PAC/PVC burden with activity ->No ectopy noted.  . Hypertension   . Hyperthyroidism    treated briefly in college  . Insomnia   . Obesity   . PAF (paroxysmal atrial fibrillation) ([place])    a. 12/2016 Event Monitor: brief run of PAF-->CHA2DS2VASc = 5--> Xarelto.  . Plantar fasciitis   . Retinal tear of left eye   . Rosacea   . Type 2 diabetes, controlled, with peripheral neuropathy ([place])    a. 11/2016 A1c 7.4.  . Uterine fibroid     Past Surgical History:  Procedure Laterality Date  . CARPAL TUNNEL RELEASE Right    Dr Burney Gauze  . CATARACT EXTRACTION    . RETINAL TEAR REPAIR CRYOTHERAPY     10/09/13, then again 3/15     There were no vitals filed for this visit.  Subjective Assessment - 02/25/20 1111    Subjective  Patient reports continued left scapular pain which comes and goes. She reports following up with PCP regarding mole on back with no concerns for cancer. Patient reports knee/hip pain is doing well;    Pertinent History  79 yo Female reports increased right knee pain since December 2020. She was found to have right meniscus tear. She is hoping to treat conservatively and avoid surgery; She did get cortisone injection in January, with no significant relief. She did get gel injection approximately 4 weeks ago and reports just now feeling relief from that. She reports increased RLE lateral thigh pain. She reports pain does prevent sleeping with patient having trouble getting in a comfortable position. In addition she also reports increased posterior LLE knee discomfort either from piriformis syndrome or possible cyst in LLE knee. She presents to therapy without AD but does report decreased activity level due to fatigue from not sleeping well. She reports only sleeping for 3-4 hours at the most and then will have to get up and move around. She has been walking the dog for short walks. She admits she has not been exercising much and  reports feeling out of shape. PMH: significant for DMx2 (controlled), HTN (controlled), chronic dizziness/impaired vision in left eye, imbalance, A-fib (on blood thinner),    How long can you sit comfortably?  variable; 10 min or 1 hour    How long can you stand comfortably?  Short time    How long can you walk comfortably?  0.25 miles    Diagnostic tests  MRI in March 2020 shows Horizontal tear anterior body of the medial meniscus    Patient Stated Goals  "Improving standing position, reduce knee valgus position, reduce pain especially with improving sleep, increase endurance    Currently in Pain?  Yes    Pain Score  4     Pain Location  Neck    Pain Orientation  Left    Pain  Descriptors / Indicators  Aching;Sore    Pain Type  Acute pain    Pain Onset  1 to 4 weeks ago    Pain Frequency  Intermittent    Aggravating Factors   varies    Pain Relieving Factors  unsure    Effect of Pain on Daily Activities  annoyance;    Multiple Pain Sites  No    Pain Onset  1 to 4 weeks ago           TREATMENT: PT inspected skin on thoracic spine with raised mole approximately penny size, multi-colored;  She does report increased pain along left scapular superior angle;  She exhibits increased tightness in levator scapulae; Educated patient in seated levator scapulae stretch with good positioning and tolerance;   Warm up on crosstrainer level 2 x4 min (unbilled);   Leg press: BLE 55# 2x15with cues for proper positioning; Patient able to exhibit good control without pain or discomfort;  Standing with green tband around BLE: Side stepping x10 feet x 2 laps each direction with min VCS to avoid stooping, increase core stabilization and improve foot placement for better hip abductor strengthening; Hip abduction x10 reps each LE Hip extension SLR x10 reps each LE Patient required min-moderate verbal/tactile cues for correct exercise technique.   Green Band around BLE above knee: Diagonal walking forward/backward x10 feet x 2 laps each with supervision and min VCS to keep slight bend in knee for better stabilization;    Patient toleratedsessionwell. She reports increased flexibility with less tightness .Reinforced HEP, advancing strengthening with standing tband exercise Patient's condition has the potential to improve in response to therapy. Maximum improvement is yet to be obtained. The anticipated improvement is attainable and reasonable in a generally predictable time.  Patient reports adherence with HEP. She also reports no exacerbation of RLE knee/hip pain and states that has been doing well;  Goals were recently assessed on 02/18/20, please refer to last  note for updated goals;                          PT Education - 02/25/20 1102    Education Details  LE strengthening, HEP, positioning;    Person(s) Educated  Patient    Methods  Explanation;Verbal cues    Comprehension  Returned demonstration;Verbal cues required;Need further instruction;Verbalized understanding       PT Short Term Goals - 02/18/20 1113      PT SHORT TERM GOAL #1   Title  Patient will be adherent to HEP at least 3x a week to improve functional strength and balance for better safety at home.    Baseline  5/25: not  done much this last week;    Time  4    Period  Weeks    Status  Not Met    Target Date  03/17/20      PT SHORT TERM GOAL #2   Title  Patient will increase RLE knee AROM extension to within 5 degrees of neutral for better standing and walking tolerance;     Time  4    Period  Weeks    Status  Partially Met    Target Date  03/17/20      PT SHORT TERM GOAL #3   Title  Patient will reports maximum of 1 sleep disturbances per night over last 3 days to exhibit improved sleeping tolerance with less knee pain and reduced fatigue.     Baseline  5/25: once or less, not very often;    Time  4    Period  Weeks    Status  Achieved    Target Date  01/22/20        PT Long Term Goals - 02/18/20 1114      PT LONG TERM GOAL #1   Title  Patient will increase BLE gross strength to 4+/5 as to improve functional strength for independent gait, increased standing tolerance and increased ADL ability.    Baseline  5/25: see flowsheet    Time  4    Period  Weeks    Status  Partially Met    Target Date  03/17/20      PT LONG TERM GOAL #2   Title  Patient will improve FOTO score to >55/100 to indicate improved functional mobility with ADLs.    Baseline  4/7: 51/100, 5/25: 50/100    Time  4    Period  Weeks    Status  Not Met    Target Date  03/17/20      PT LONG TERM GOAL #3   Title  Patient will report a worst pain of 3/10 on VAS in       right knee       to improve tolerance with ADLs and reduced symptoms with activities.     Baseline  5/25: 3-4/10 in right knee;    Time  4    Period  Weeks    Status  Partially Met    Target Date  03/17/20      PT LONG TERM GOAL #4   Title  Patient will complete >1000 feet on 6 min walk test without increase in knee pain to improve community ambulator distance for functional tasks and return to PLOF.    Baseline  5/25: 1150 feet    Time  4    Period  Weeks    Status  Achieved    Target Date  03/17/20      PT LONG TERM GOAL #5   Title  Patient (> 17 years old) will complete five times sit to stand test in < 15 seconds indicating an increased LE strength and improved balance.    Baseline  5/25: 15.15 sec    Time  4    Period  Weeks    Status  Partially Met    Target Date  03/17/20            Plan - 02/26/20 0820    Clinical Impression Statement  Patient motivated and participated well within session. Goals were recently assessed on 02/18/20 and pt has made improvements in strength and reports significantly reduced leg/knee pain. Patient continues  to have lower cardiovascular endurance being able to only tolerate short distance walking. She also has some weakness in BLE which limits mobility. Patient would benefit from skilled PT intervention to improve strength, balance and mobility;    Personal Factors and Comorbidities  Age;Comorbidity 3+;Past/Current Experience    Comorbidities  PMH: significant for DMx2 (controlled), HTN (controlled), chronic dizziness/impaired vision in left eye, imbalance, A-fib (on blood thinner),    Examination-Activity Limitations  Bed Mobility;Caring for Others;Lift;Locomotion Level;Sit;Sleep;Squat;Stairs;Stand;Transfers    Examination-Participation Restrictions  Church;Cleaning;Community Activity;Shop;Volunteer;Yard Work    [employer]  Evolving/Moderate complexity    Rehab Potential  Good    PT Frequency  2x / week    PT  Duration  4 weeks    PT Treatment/Interventions  ADLs/Self Care Home Management;Cryotherapy;Electrical Stimulation;Moist Heat;Gait training;Stair training;Functional mobility training;Therapeutic activities;Therapeutic exercise;Balance training;Neuromuscular re-education;Patient/family education;Manual techniques;Passive range of motion;Dry needling;Energy conservation;Taping    PT Next Visit Plan  assess strength, 6 min walk test, initiate HEP    PT Home Exercise Plan  will address next session;    Consulted and Agree with Plan of Care  Patient       Patient will benefit from skilled therapeutic intervention in order to improve the following deficits and impairments:  Abnormal gait, Decreased balance, Decreased endurance, Decreased mobility, Difficulty walking, Improper body mechanics, Decreased range of motion, Decreased activity tolerance, Decreased strength, Pain  Visit Diagnosis: Acute pain of right knee  Muscle weakness (generalized)  Unsteadiness on feet  Difficulty in walking, not elsewhere classified     Problem List Patient Active Problem List   Diagnosis Date Noted  . Trapezius strain, left, initial encounter 02/21/2020  . Left-sided chest pain 10/23/2019  . Overactive bladder 10/09/2019  . Lumbar sprain 03/28/2019  . Tinnitus, bilateral 11/28/2017  . Abnormal MRI of head 11/14/2017  . Meningioma ([place]) 09/21/2017  . PAF (paroxysmal atrial fibrillation) ([place]) 02/03/2017  . Preventative health care 06/02/2016  . Advance directive discussed with patient 06/02/2016  . Achilles tendonitis 03/11/2016  . Hypertension   . DVT, lower extremity, recurrent ([place])   . Type 2 diabetes, controlled, with peripheral neuropathy ([place])   . Generalized osteoarthritis of multiple sites   . GERD (gastroesophageal reflux disease)   . Hyperthyroidism   . Retinal hole of right eye 06/26/2014  . Amblyopia, right 05/08/2014  . Status post intraocular lens implant 05/08/2014     Janese Radabaugh PT, DPT 02/26/2020, 8:23 AM  [place] MAIN [facility] SERVICES [address] [place], [place], [address] Phone: [phone]   Fax:  [phone]  Name: Nyja Westbrook MRN: [id number] Date of Birth: [birth year]

## 2020-02-27 ENCOUNTER — Telehealth: Payer: Self-pay

## 2020-02-27 DIAGNOSIS — I10 Essential (primary) hypertension: Secondary | ICD-10-CM

## 2020-02-27 DIAGNOSIS — K219 Gastro-esophageal reflux disease without esophagitis: Secondary | ICD-10-CM

## 2020-02-27 NOTE — Telephone Encounter (Signed)
Referral created.

## 2020-02-27 NOTE — Telephone Encounter (Signed)
Per written referral from PCP, requesting referral in Epic for [name] to chronic care management pharmacy services for the following conditions:   Essential hypertension, benign  [I10]  GERD [K21.9]  [name], PharmD Clinical Pharmacist [facility] at [facility] [phone]

## 2020-03-02 ENCOUNTER — Ambulatory Visit: Payer: Medicare Other

## 2020-03-02 ENCOUNTER — Other Ambulatory Visit: Payer: Self-pay

## 2020-03-02 DIAGNOSIS — I10 Essential (primary) hypertension: Secondary | ICD-10-CM

## 2020-03-02 DIAGNOSIS — E1142 Type 2 diabetes mellitus with diabetic polyneuropathy: Secondary | ICD-10-CM

## 2020-03-02 NOTE — Patient Instructions (Signed)
March 02, 2020  Dear [name],  It was a pleasure meeting you during our initial appointment on March 02, 2020. Below is a summary of the goals we discussed and components of chronic care management. Please contact me anytime with questions or concerns.   Visit Information  Goals Addressed            This Visit's Progress   . Pharmacy Care Plan       CARE PLAN ENTRY  Current Barriers:  . Chronic Disease Management support, education, and care coordination needs related to Hypertension and Diabetes  Hypertension/Cholesterol/Cardiovascular Risk Assessment: . Pharmacist Clinical Goal(s): o Over the next 6 months, patient will work with PharmD and providers to maintain LDL goal < 100 and achieve triglycerides < 150, HDL > 50; Maintain blood pressure < 140/90 mmHg  Lipid Panel     Component Value Date/Time   CHOL 173 10/09/2019 0910   TRIG 170.0 (H) 10/09/2019 0910   HDL 48.10 10/09/2019 0910   CHOLHDL 4 10/09/2019 0910   VLDL 34.0 10/09/2019 0910   LDLCALC 91 10/09/2019 0910   Office blood pressures are: BP Readings from Last 3 Encounters:  02/21/20 140/76  10/23/19 126/82  10/09/19 140/82   Current regimen:  o No pharmacotherapy for cholesterol o Blood pressure:  Metoprolol succinate 25 mg - 1 tablet daily  Losartan/HCTZ 100-12.5 mg - 1 tablet daily  . Interventions: o Assessed cardiovascular risk o CV risk factors include diabetes and hypertension o Recommend strict control of diabetes in order to lower cardiovascular risk in addition to low dose statin therapy (examples: atorvastatin, rosuvastatin) and heart healthy diet . Patient self care activities - Over the next 6 months, patient will: o Continue heart healthy diet, considering principles of the DASH or mediterranean diet o Consider starting a cholesterol medication to lower risk of heart attack and stroke  Diabetes . Pharmacist Clinical Goal(s): o Over the next 6 months, patient will work with PharmD  and providers to maintain A1c goal <8% (most recent A1c 7.6% 10/09/19) . Current regimen:   Glimepiride 1 mg - 1 tablet daily  Pioglitazone 45 mg - 1 tablet daily  . Interventions: o Discussed patient A1c goals and she would like to maintain A1c at 7.6% o Recommended considering increase in glimepiride to target more stringent A1c goal of < 7% in order to prevent microvascular complications (retinopathy, neuropathy, nephropathy) . Patient self care activities - Over the next 6 months, patient will: o Consider checking blood sugar daily 2 weeks prior to an office visit, document, and provide at future appointment o Contact provider with any episodes of hypoglycemia  Initial goal documentation      Angela Bentley was given information about Chronic Care Management services today including:  1. CCM service includes personalized support from designated clinical staff supervised by her physician, including individualized plan of care and coordination with other care providers 2. 24/7 contact phone numbers for assistance for urgent and routine care needs. 3. Standard insurance, coinsurance, copays and deductibles apply for chronic care management only during months in which we provide at least 20 minutes of these services. Most insurances cover these services at 100%, however patients may be responsible for any copay, coinsurance and/or deductible if applicable. This service may help you avoid the need for more expensive face-to-face services. 4. Only one practitioner may furnish and bill the service in a calendar month. 5. The patient may stop CCM services at any time (effective at the end of the  month) by phone call to the office staff.  Patient agreed to services and verbal consent obtained.   The patient verbalized understanding of instructions provided today and agreed to receive a mailed copy of patient instruction and/or educational materials. Telephone follow up appointment with pharmacy  team member scheduled for: 09/01/20 at 1:00 PM (6 month telephone)  [name], PharmD Clinical Pharmacist [facility] at [facility] [phone]     Why follow it? Research shows. . Those who follow the Mediterranean diet have a reduced risk of heart disease  . The diet is associated with a reduced incidence of Parkinson's and Alzheimer's diseases . People following the diet may have longer life expectancies and lower rates of chronic diseases  . The Dietary Guidelines for Americans recommends the Mediterranean diet as an eating plan to promote health and prevent disease  What Is the Mediterranean Diet?  . Healthy eating plan based on typical foods and recipes of Mediterranean-style cooking . The diet is primarily a plant based diet; these foods should make up a majority of meals   Starches - Plant based foods should make up a majority of meals - They are an important sources of vitamins, minerals, energy, antioxidants, and fiber - Choose whole grains, foods high in fiber and minimally processed items  - Typical grain sources include wheat, oats, barley, corn, brown rice, bulgar, farro, millet, polenta, couscous  - Various types of beans include chickpeas, lentils, fava beans, black beans, white beans   Fruits  Veggies - Large quantities of antioxidant rich fruits & veggies; 6 or more servings  - Vegetables can be eaten raw or lightly drizzled with oil and cooked  - Vegetables common to the traditional Mediterranean Diet include: artichokes, arugula, beets, broccoli, brussel sprouts, cabbage, carrots, celery, collard greens, cucumbers, eggplant, kale, leeks, lemons, lettuce, mushrooms, okra, onions, peas, peppers, potatoes, pumpkin, radishes, rutabaga, shallots, spinach, sweet potatoes, turnips, zucchini - Fruits common to the Mediterranean Diet include: apples, apricots, avocados, cherries, clementines, dates, figs, grapefruits, grapes, melons, nectarines, oranges, peaches,  pears, pomegranates, strawberries, tangerines  Fats - Replace butter and margarine with healthy oils, such as olive oil, canola oil, and tahini  - Limit nuts to no more than a handful a day  - Nuts include walnuts, almonds, pecans, pistachios, pine nuts  - Limit or avoid candied, honey roasted or heavily salted nuts - Olives are central to the [employer] - can be eaten whole or used in a variety of dishes   Meats Protein - Limiting red meat: no more than a few times a month - When eating red meat: choose lean cuts and keep the portion to the size of deck of cards - Eggs: approx. 0 to 4 times a week  - Fish and lean poultry: at least 2 a week  - Healthy protein sources include, chicken, [place], lean beef, lamb - Increase intake of seafood such as tuna, salmon, trout, mackerel, shrimp, scallops - Avoid or limit high fat processed meats such as sausage and bacon  Dairy - Include moderate amounts of low fat dairy products  - Focus on healthy dairy such as fat free yogurt, skim milk, low or reduced fat cheese - Limit dairy products higher in fat such as whole or 2% milk, cheese, ice cream  Alcohol - Moderate amounts of red wine is ok  - No more than 5 oz daily for women (all ages) and men older than age 57  - No more than 10 oz of  wine daily for men younger than 61  Other - Limit sweets and other desserts  - Use herbs and spices instead of salt to flavor foods  - Herbs and spices common to the traditional Mediterranean Diet include: basil, bay leaves, chives, cloves, cumin, fennel, garlic, lavender, marjoram, mint, oregano, parsley, pepper, rosemary, sage, savory, sumac, tarragon, thyme   It's not just a diet, it's a lifestyle:  . The Mediterranean diet includes lifestyle factors typical of those in the region  . Foods, drinks and meals are best eaten with others and savored . Daily physical activity is important for overall good health . This could be strenuous exercise like running  and aerobics . This could also be more leisurely activities such as walking, housework, yard-work, or taking the stairs . Moderation is the key; a balanced and healthy diet accommodates most foods and drinks . Consider portion sizes and frequency of consumption of certain foods   Meal Ideas & Options:  . Breakfast:  o Whole wheat toast or whole wheat English muffins with peanut butter & hard boiled egg o Steel cut oats topped with apples & cinnamon and skim milk  o Fresh fruit: banana, strawberries, melon, berries, peaches  o Smoothies: strawberries, bananas, greek yogurt, peanut butter o Low fat greek yogurt with blueberries and granola  o Egg white omelet with spinach and mushrooms o Breakfast couscous: whole wheat couscous, apricots, skim milk, cranberries  . Sandwiches:  o Hummus and grilled vegetables (peppers, zucchini, squash) on whole wheat bread   o Grilled chicken on whole wheat pita with lettuce, tomatoes, cucumbers or tzatziki  o Tuna salad on whole wheat bread: tuna salad made with greek yogurt, olives, red peppers, capers, green onions o Garlic rosemary lamb pita: lamb sauted with garlic, rosemary, salt & pepper; add lettuce, cucumber, greek yogurt to pita - flavor with lemon juice and black pepper  . Seafood:  o Mediterranean grilled salmon, seasoned with garlic, basil, parsley, lemon juice and black pepper o Shrimp, lemon, and spinach whole-grain pasta salad made with low fat greek yogurt  o Seared scallops with lemon orzo  o Seared tuna steaks seasoned salt, pepper, coriander topped with tomato mixture of olives, tomatoes, olive oil, minced garlic, parsley, green onions and cappers  . Meats:  o Herbed greek chicken salad with kalamata olives, cucumber, feta  o Red bell peppers stuffed with spinach, bulgur, lean ground beef (or lentils) & topped with feta   o Kebabs: skewers of chicken, tomatoes, onions, zucchini, squash  o [place] burgers: made with red onions, mint,  dill, lemon juice, feta cheese topped with roasted red peppers . Vegetarian o Cucumber salad: cucumbers, artichoke hearts, celery, red onion, feta cheese, tossed in olive oil & lemon juice  o Hummus and whole grain pita points with a greek salad (lettuce, tomato, feta, olives, cucumbers, red onion) o Lentil soup with celery, carrots made with vegetable broth, garlic, salt and pepper  o Tabouli salad: parsley, bulgur, mint, scallions, cucumbers, tomato, radishes, lemon juice, olive oil, salt and pepper.

## 2020-03-02 NOTE — Chronic Care Management (AMB) (Signed)
Chronic Care Management Pharmacy  Name: Rada Zegers  MRN: [id number] DOB: Dec 25, 1940  Chief Complaint/ HPI  Angela Bentley,  79 y.o. , female presents for their Initial CCM visit with the clinical pharmacist via telephone.  PCP : [name], MD  Their chronic conditions include: HTN, DVT, AF, GERD, T2DM, hyperthyroidism, OA, OAB  Patient concerns: denies medication concerns, reports doing well   Office Visits:  02/21/20: [name] - suspicious mole on back, benign, no treatment needed  10/23/19: [name] - left sided chest pain, KUB and chest xray normal, observe only   10/09/19: [name] AWV - taking Myrbetriq PRN for OAB, may need to increase to daily, try MiraLAX daily for constipation   Consult Visit:  Physical therapy/rehab 3 days per week for since April 2021 due to knee/right meniscus tear and balance - will complete PT in July   03/11/20: Cardiology - cont current meds  Allergies  Allergen Reactions  . Penicillins     Has patient had a PCN reaction causing immediate rash, facial/tongue/throat swelling, SOB or lightheadedness with hypotension: Yes Has patient had a PCN reaction causing severe rash involving mucus membranes or skin necrosis: Yes Has patient had a PCN reaction that required hospitalization No Has patient had a PCN reaction occurring within the last 10 years: No If all of the above answers are "NO", then may proceed with Cephalosporin use.    . Sulfa Antibiotics     Rash/itching  . Azithromycin Palpitations   Medications: Outpatient Encounter Medications as of 03/02/2020  Medication Sig  . diclofenac sodium (VOLTAREN) 1 % GEL Apply 4 g topically 4 (four) times daily. (Patient taking differently: Apply 4 g topically 4 (four) times daily as needed. )  . fluticasone (VERAMYST) 27.5 MCG/SPRAY nasal spray Place 2 sprays into the nose daily as needed for allergies.   Marland Kitchen glimepiride (AMARYL) 1 MG tablet TAKE 1 TABLET EVERY DAY WITH BREAKFAST  .  losartan-hydrochlorothiazide (HYZAAR) 100-12.5 MG tablet TAKE 1 TABLET EVERY DAY  . metoprolol succinate (TOPROL-XL) 25 MG 24 hr tablet TAKE 1 TABLET (25 MG TOTAL) BY MOUTH DAILY.  . Multiple Vitamin (MULTIVITAMIN) tablet Take 1 tablet by mouth daily.  Marland Kitchen nystatin cream (MYCOSTATIN) Apply 1 application topically 2 (two) times daily. Apply to affected area BID for up to 7 days. (Patient taking differently: Apply 1 application topically 2 (two) times daily. Apply to affected area BID PRN)  . pioglitazone (ACTOS) 45 MG tablet TAKE 1 TABLET EVERY DAY  . XARELTO 20 MG TABS tablet TAKE 1 TABLET (20 MG TOTAL) BY MOUTH DAILY WITH SUPPER.  . celecoxib (CELEBREX) 200 MG capsule TAKE 1 CAPSULE BY MOUTH ONCE DAILY AS NEEDED FOR MILD TO MODERATE PAIN (Patient not taking: Reported on 03/02/2020)  . furosemide (LASIX) 20 MG tablet Take 1 tablet (20 mg total) by mouth daily as needed. (Patient not taking: Reported on 03/02/2020)  . potassium chloride SA (K-DUR,KLOR-CON) 20 MEQ tablet Take 1 tablet (20 mEq total) by mouth daily as needed. (Patient not taking: Reported on 03/02/2020)   No facility-administered encounter medications on file as of 03/02/2020.   Current Diagnosis/Assessment:   Emergency planning/management officer Strain: Low Risk   . Difficulty of Paying Living Expenses: Not very hard   Goals Addressed            This Visit's Progress   . Pharmacy Care Plan       CARE PLAN ENTRY  Current Barriers:  . Chronic Disease Management support, education, and care coordination needs  related to Hypertension and Diabetes  Hypertension/Cholesterol/Cardiovascular Risk Assessment: . Pharmacist Clinical Goal(s): o Over the next 6 months, patient will work with PharmD and providers to maintain LDL goal < 100 and achieve triglycerides < 150, HDL > 50; Maintain blood pressure < 140/90 mmHg  Lipid Panel     Component Value Date/Time   CHOL 173 10/09/2019 0910   TRIG 170.0 (H) 10/09/2019 0910   HDL 48.10 10/09/2019 0910    CHOLHDL 4 10/09/2019 0910   VLDL 34.0 10/09/2019 0910   LDLCALC 91 10/09/2019 0910   Office blood pressures are: BP Readings from Last 3 Encounters:  02/21/20 140/76  10/23/19 126/82  10/09/19 140/82   Current regimen:  o No pharmacotherapy for cholesterol o Blood pressure:  Metoprolol succinate 25 mg - 1 tablet daily  Losartan/HCTZ 100-12.5 mg - 1 tablet daily  . Interventions: o Assessed cardiovascular risk o CV risk factors include diabetes and hypertension o Recommend strict control of diabetes in order to lower cardiovascular risk in addition to low dose statin therapy (examples: atorvastatin, rosuvastatin) and heart healthy diet . Patient self care activities - Over the next 6 months, patient will: o Continue heart healthy diet, considering principles of the DASH or mediterranean diet o Consider starting a cholesterol medication to lower risk of heart attack and stroke  Diabetes . Pharmacist Clinical Goal(s): o Over the next 6 months, patient will work with PharmD and providers to maintain A1c goal <8% (most recent A1c 7.6% 10/09/19) . Current regimen:   Glimepiride 1 mg - 1 tablet daily  Pioglitazone 45 mg - 1 tablet daily  . Interventions: o Discussed patient A1c goals and she would like to maintain A1c at 7.6% o Recommended considering increase in glimepiride to target more stringent A1c goal of < 7% in order to prevent microvascular complications (retinopathy, neuropathy, nephropathy) . Patient self care activities - Over the next 6 months, patient will: o Consider checking blood sugar daily 2 weeks prior to an office visit, document, and provide at future appointment o Contact provider with any episodes of hypoglycemia  Initial goal documentation      Hypertension   CMP Latest Ref Rng & Units 10/09/2019 03/24/2019 10/03/2018  Glucose 70 - 99 mg/dL 142(H) 144(H) 152(H)  BUN 6 - 23 mg/dL 16 15 14   Creatinine 0.40 - 1.20 mg/dL 0.79 0.72 0.84  Sodium 135 - 145  mEq/L 138 139 137  Potassium 3.5 - 5.1 mEq/L 4.0 3.4(L) 4.0  Chloride 96 - 112 mEq/L 100 100 99  CO2 19 - 32 mEq/L 30 27 32  Calcium 8.4 - 10.5 mg/dL 9.7 9.3 9.7  Total Protein 6.0 - 8.3 g/dL 6.8 - 7.0  Total Bilirubin 0.2 - 1.2 mg/dL 0.6 - 0.5  Alkaline Phos 39 - 117 U/L 66 - 60  AST 0 - 37 U/L 19 - 19  ALT 0 - 35 U/L 20 - 17   Office blood pressures are: BP Readings from Last 3 Encounters:  02/21/20 140/76  10/23/19 126/82  10/09/19 140/82   Patient has failed these meds in the past: none  Patient checks BP at home infrequently Patient home BP readings are ranging: none reported  BP goal < 140/90 mmHg Patient is currently controlled on the following medications:   Metoprolol succinate 25 mg - 1 tablet daily  Losartan/HCTZ 100-12.5 mg - 1 tablet daily   Potassium Chloride SA 20 mEq - 1 tablet daily with Lasix  Furosemide 20 mg - PRN swelling  We discussed:  did not like furosemide due to frequent urination, has not taken in > 1 year; denies frequent leg swelling   Plan: Continue current medications   Hyperlipidemia   Lipid Panel     Component Value Date/Time   CHOL 173 10/09/2019 0910   TRIG 170.0 (H) 10/09/2019 0910   HDL 48.10 10/09/2019 0910   LDLCALC 91 10/09/2019 0910    LDL goal < 100, TG < 150, HDL > 50 Patient has failed these meds in past: none  Patient is currently controlled (LDL at goal) on the following medications:  . No pharmacotherapy  We discussed: age > 52 thus clinical assessment and risk discussion recommended; pt does not have history of ASCVD; DM and HTN are primary risk factors, recommend more stringent control of DM. Pt prefers conservative approach to treatment.  Plan: Continue control with diet and exercise; Consider low dose statin therapy.  AFIB   CBC Latest Ref Rng & Units 10/09/2019 03/24/2019 10/03/2018  WBC 4.0 - 10.5 K/uL 9.6 10.0 7.5  Hemoglobin 12.0 - 15.0 g/dL 12.5 12.5 12.8  Hematocrit 36.0 - 46.0 % 37.8 38.1 38.2    Platelets 150.0 - 400.0 K/uL 260.0 260 226.0   Patient is currently rate controlled. Patient has failed these meds in past: none reported Patient is currently controlled on the following medications:   Xarelto 20 mg - 1 tablet daily in the evening with supper  Metoprolol succinate 25 mg - 1 tablet daily with supper  We discussed: denies abnormal bruising or bleeding; CBC stable  Plan: Continue current medications  Diabetes   Recent Relevant Labs: Lab Results  Component Value Date/Time   HGBA1C 7.6 (H) 10/09/2019 09:10 AM   HGBA1C 7.4 (A) 03/28/2019 03:46 PM   HGBA1C 7.4 (H) 10/03/2018 09:30 AM   HGBA1C 6.5 07/28/2015 12:00 AM    Checking BG: Rarely (prefers not to be concerned about BG) Denies hypoglycemia or history of hypoglycemia  Patient has failed these meds in past: metformin (diarrhea)  A1c goal < 8% (pt preference)  Patient is currently controlled on the following medications:   Glimepiride 1 mg - 1 tablet daily  Pioglitazone 45 mg - 1 tablet daily   Last diabetic eye exam: has comprehensive exam 1 week ago, no DM retinopathy Lab Results  Component Value Date/Time   HMDIABEYEEXA No Retinopathy 02/19/2019 12:00 AM    Last diabetic foot exam:  Lab Results  Component Value Date/Time   HMDIABFOOTEX done 10/09/2019 12:00 AM    We discussed: pt content with current control, discussed A1c goals and pt would like to maintain current A1c of 7.6% and declines medication changes (suggested glimepiride dose increase), pt reports happy she is able to have some flexibility with diet, nutrition major and aware of healthy dietary choices  Diet: limits sodium intake due to husband with CHF   Dinner: Salad and ice cream every night  Lunch: (varies) chicken breast, ground lamb, fresh vegetables, potatoes or rice  Breakfast: eggs or shredded wheat  Exercise: limited due to poor balance with walking, ongoing for past 3 years since benign brain tumor, currently in PT    Plan: Continue current medications; Consider more stringent control due to lack of history of hypoglycemia and prevention of microvascular complications.   Osteoarthritis    Patient has failed these meds in past: none reported Patient is currently controlled on the following medications:   Celecoxib 200 mg - 1 capsule daily PRN (not taking, but has at home)  Diclofenac gel 1% -  Apply QID PRN (uses occasionally for knee pain)  We discussed: denies using anything regularly for pain, takes an occasional Tylenol in addition to diclofenac gel   Plan: Continue current medications  Medication Management  Misc: Nystatin cream  OTCs: multivitamin, Ocuvite - alternates vitamins every other day; flonase - 1 spray in each nostril, uses at night for allergies   Pharmacy/Benefits: [facility] [employer]/Mail order  Adherence: Meds are synced, refills timely  Social support: [facility] with husband   Affordability: able to afford Xarelto, but price is high, discussed PAP but income above limits  CCM Follow Up: 09/01/20 at 1:00 PM (6 month telephone)  [name], PharmD Clinical Pharmacist [facility] [phone]

## 2020-03-03 ENCOUNTER — Encounter: Payer: Self-pay | Admitting: Physical Therapy

## 2020-03-03 ENCOUNTER — Other Ambulatory Visit: Payer: Self-pay

## 2020-03-03 ENCOUNTER — Ambulatory Visit: Payer: Medicare Other | Admitting: Physical Therapy

## 2020-03-03 DIAGNOSIS — M25561 Pain in right knee: Secondary | ICD-10-CM

## 2020-03-03 DIAGNOSIS — M6281 Muscle weakness (generalized): Secondary | ICD-10-CM

## 2020-03-03 DIAGNOSIS — R262 Difficulty in walking, not elsewhere classified: Secondary | ICD-10-CM

## 2020-03-03 DIAGNOSIS — R2681 Unsteadiness on feet: Secondary | ICD-10-CM

## 2020-03-03 NOTE — Therapy (Signed)
[place] MAIN [facility] SERVICES [address] [place], [place], [address] Phone: [phone]   Fax:  [phone]  Physical Therapy Treatment  Patient Details  Name: Angela Bentley MRN: [id number] Date of Birth: [birth year] Referring Provider (PT): Meredith Pel, MD   Encounter Date: 03/03/2020  PT End of Session - 03/03/20 1415    Visit Number  11    Number of Visits  17    Date for PT Re-Evaluation  03/17/20    PT Start Time  3154    PT Stop Time  1500    PT Time Calculation (min)  45 min    Activity Tolerance  Patient tolerated treatment well;No increased pain    Behavior During Therapy  WFL for tasks assessed/performed       Past Medical History:  Diagnosis Date  . Balance problems   . Brain tumor ([place])    a. Left intraventricular tumor ->stable by 06/2017 MRI. Followed @ [facility].  . Gait disturbance    a. Unsteady on feet/balance difficulty.  . Generalized osteoarthritis of multiple sites   . GERD (gastroesophageal reflux disease)   . History of DVT (deep vein thrombosis) 2016   estrogen and airflights. Rx with xarelto for 3 months  . History of stress test    a. 11/2016 ETT: No ST/T changes.  Performed to assess PAC/PVC burden with activity ->No ectopy noted.  . Hypertension   . Hyperthyroidism    treated briefly in college  . Insomnia   . Obesity   . PAF (paroxysmal atrial fibrillation) ([place])    a. 12/2016 Event Monitor: brief run of PAF-->CHA2DS2VASc = 5--> Xarelto.  . Plantar fasciitis   . Retinal tear of left eye   . Rosacea   . Type 2 diabetes, controlled, with peripheral neuropathy ([place])    a. 11/2016 A1c 7.4.  . Uterine fibroid     Past Surgical History:  Procedure Laterality Date  . CARPAL TUNNEL RELEASE Right    Dr Burney Gauze  . CATARACT EXTRACTION    . RETINAL TEAR REPAIR CRYOTHERAPY     10/09/13, then again 3/15    There were no vitals filed for this visit.  Subjective Assessment - 03/03/20 1419    Subjective  Patient reports no pain in right knee. Reports not sleeping well last few nights and feeling worse in balance; She is still having neck/shoulder discomfort and is unsure what is causing that.    Pertinent History  79 yo Female reports increased right knee pain since December 2020. She was found to have right meniscus tear. She is hoping to treat conservatively and avoid surgery; She did get cortisone injection in January, with no significant relief. She did get gel injection approximately 4 weeks ago and reports just now feeling relief from that. She reports increased RLE lateral thigh pain. She reports pain does prevent sleeping with patient having trouble getting in a comfortable position. In addition she also reports increased posterior LLE knee discomfort either from piriformis syndrome or possible cyst in LLE knee. She presents to therapy without AD but does report decreased activity level due to fatigue from not sleeping well. She reports only sleeping for 3-4 hours at the most and then will have to get up and move around. She has been walking the dog for short walks. She admits she has not been exercising much and reports feeling out of shape. PMH: significant for DMx2 (controlled), HTN (controlled), chronic dizziness/impaired vision in left eye, imbalance, A-fib (  on blood thinner),    How long can you sit comfortably?  variable; 10 min or 1 hour    How long can you stand comfortably?  Short time    How long can you walk comfortably?  0.25 miles    Diagnostic tests  MRI in March 2020 shows Horizontal tear anterior body of the medial meniscus    Patient Stated Goals  "Improving standing position, reduce knee valgus position, reduce pain especially with improving sleep, increase endurance    Currently in Pain?  Yes    Pain Score  2     Pain Location  Neck    Pain Orientation  Left    Pain Descriptors / Indicators  Aching;Sore    Pain Type  Acute pain    Pain Onset  1 to 4 weeks ago     Pain Frequency  Intermittent    Aggravating Factors   varies    Pain Relieving Factors  unsure    Effect of Pain on Daily Activities  annoyance;    Multiple Pain Sites  No    Pain Onset  1 to 4 weeks ago              TREATMENT: Warm up on crosstrainer level 2 x4 min (unbilled);   Leg press: BLE 55# 2x15with cues for proper positioning; Patient able to exhibit good control without pain or discomfort;  Gait on treadmill:  1.5-1.8 mph x5 min x2 sets with 2 HHA with cues to increase step length and improve ankle DF at heel strike especially on LLE; Patient does become short of breath following gait, Spo2 98%, HR 103 bpm;   Standing with green tband around BLE: Side stepping x10 feet x 2 laps each direction with min VCS to avoid stooping, increase core stabilization and improve foot placement for better hip abductor strengthening; Hip abduction x12 reps each LE Patient required min-moderate verbal/tactile cues for correct exercise technique.  Side step up/down airex-4 inch step x10 reps each direction with B rail assist; Patient tolerated well with no report of pain or discomfort; She does report mild difficulty when straining RLE;  Forward step down from 4 inch step to airex (heel tap) x10 reps each LE with B rail assist for safety;  Forward lunges on BOSU x10 reps each LE with min VCs for proper positioning for optimal muscle strengthening;   Seated hamstring curl green tband x20 reps each LE;   Patient toleratedsessionwell. She reports increased flexibility with less tightness. Reinforced HEP; Patient expressed how her right knee is not bothering her much. PT will reach out to PCP for imbalance referral to assess balance since her knee/hip pain is improving;                      PT Education - 03/03/20 1414    Education Details  LE strengthening, HEP, positioning;    Person(s) Educated  Patient    Methods  Explanation;Verbal cues    Comprehension   Verbalized understanding;Returned demonstration;Verbal cues required;Need further instruction       PT Short Term Goals - 02/18/20 1113      PT SHORT TERM GOAL #1   Title  Patient will be adherent to HEP at least 3x a week to improve functional strength and balance for better safety at home.    Baseline  5/25: not done much this last week;    Time  4    Period  Weeks    Status  Not Met    Target Date  03/17/20      PT SHORT TERM GOAL #2   Title  Patient will increase RLE knee AROM extension to within 5 degrees of neutral for better standing and walking tolerance;     Time  4    Period  Weeks    Status  Partially Met    Target Date  03/17/20      PT SHORT TERM GOAL #3   Title  Patient will reports maximum of 1 sleep disturbances per night over last 3 days to exhibit improved sleeping tolerance with less knee pain and reduced fatigue.     Baseline  5/25: once or less, not very often;    Time  4    Period  Weeks    Status  Achieved    Target Date  01/22/20        PT Long Term Goals - 02/18/20 1114      PT LONG TERM GOAL #1   Title  Patient will increase BLE gross strength to 4+/5 as to improve functional strength for independent gait, increased standing tolerance and increased ADL ability.    Baseline  5/25: see flowsheet    Time  4    Period  Weeks    Status  Partially Met    Target Date  03/17/20      PT LONG TERM GOAL #2   Title  Patient will improve FOTO score to >55/100 to indicate improved functional mobility with ADLs.    Baseline  4/7: 51/100, 5/25: 50/100    Time  4    Period  Weeks    Status  Not Met    Target Date  03/17/20      PT LONG TERM GOAL #3   Title  Patient will report a worst pain of 3/10 on VAS in      right knee       to improve tolerance with ADLs and reduced symptoms with activities.     Baseline  5/25: 3-4/10 in right knee;    Time  4    Period  Weeks    Status  Partially Met    Target Date  03/17/20      PT LONG TERM GOAL #4   Title   Patient will complete >1000 feet on 6 min walk test without increase in knee pain to improve community ambulator distance for functional tasks and return to PLOF.    Baseline  5/25: 1150 feet    Time  4    Period  Weeks    Status  Achieved    Target Date  03/17/20      PT LONG TERM GOAL #5   Title  Patient (> 11 years old) will complete five times sit to stand test in < 15 seconds indicating an increased LE strength and improved balance.    Baseline  5/25: 15.15 sec    Time  4    Period  Weeks    Status  Partially Met    Target Date  03/17/20            Plan - 03/03/20 1507    Clinical Impression Statement  Patient motivated and participated well within session. Instructed patient in advanced LE strengthening exercise. She does require min Vcs for proper exercise technique including position for optimal muscle activation. patient denies any increase in RLE knee pain with advanced exercise. She does however report continued imbalance and difficulty walking. PT  will reach out to PCP regarding imbalance for referral to address those deficits. Patient would benefit from additional skilled PT Intervention to improve strength, balance and mobility;    Personal Factors and Comorbidities  Age;Comorbidity 3+;Past/Current Experience    Comorbidities  PMH: significant for DMx2 (controlled), HTN (controlled), chronic dizziness/impaired vision in left eye, imbalance, A-fib (on blood thinner),    Examination-Activity Limitations  Bed Mobility;Caring for Others;Lift;Locomotion Level;Sit;Sleep;Squat;Stairs;Stand;Transfers    Examination-Participation Restrictions  Church;Cleaning;Community Activity;Shop;Volunteer;Yard Work    Merchant navy officer  Evolving/Moderate complexity    Rehab Potential  Good    PT Frequency  2x / week    PT Duration  4 weeks    PT Treatment/Interventions  ADLs/Self Care Home Management;Cryotherapy;Electrical Stimulation;Moist Heat;Gait training;Stair  training;Functional mobility training;Therapeutic activities;Therapeutic exercise;Balance training;Neuromuscular re-education;Patient/family education;Manual techniques;Passive range of motion;Dry needling;Energy conservation;Taping    PT Next Visit Plan  assess strength, 6 min walk test, initiate HEP    PT Home Exercise Plan  will address next session;    Consulted and Agree with Plan of Care  Patient       Patient will benefit from skilled therapeutic intervention in order to improve the following deficits and impairments:  Abnormal gait, Decreased balance, Decreased endurance, Decreased mobility, Difficulty walking, Improper body mechanics, Decreased range of motion, Decreased activity tolerance, Decreased strength, Pain  Visit Diagnosis: Acute pain of right knee  Muscle weakness (generalized)  Unsteadiness on feet  Difficulty in walking, not elsewhere classified     Problem List Patient Active Problem List   Diagnosis Date Noted  . Trapezius strain, left, initial encounter 02/21/2020  . Left-sided chest pain 10/23/2019  . Overactive bladder 10/09/2019  . Lumbar sprain 03/28/2019  . Tinnitus, bilateral 11/28/2017  . Abnormal MRI of head 11/14/2017  . Meningioma ([place]) 09/21/2017  . PAF (paroxysmal atrial fibrillation) ([place]) 02/03/2017  . Preventative health care 06/02/2016  . Advance directive discussed with patient 06/02/2016  . Achilles tendonitis 03/11/2016  . Hypertension   . DVT, lower extremity, recurrent ([place])   . Type 2 diabetes, controlled, with peripheral neuropathy ([place])   . Generalized osteoarthritis of multiple sites   . GERD (gastroesophageal reflux disease)   . Hyperthyroidism   . Retinal hole of right eye 06/26/2014  . Amblyopia, right 05/08/2014  . Status post intraocular lens implant 05/08/2014    Brittyn Salaz PT, DPT 03/03/2020, 3:10 PM  [place] MAIN [facility] SERVICES [address] [place], [place],  [address] Phone: [phone]   Fax:  [phone]  Name: Angela Bentley MRN: [id number] Date of Birth: [birth year]

## 2020-03-05 ENCOUNTER — Other Ambulatory Visit: Payer: Self-pay

## 2020-03-05 ENCOUNTER — Encounter: Payer: Self-pay | Admitting: Physical Therapy

## 2020-03-05 ENCOUNTER — Ambulatory Visit: Payer: Medicare Other | Admitting: Physical Therapy

## 2020-03-05 DIAGNOSIS — M6281 Muscle weakness (generalized): Secondary | ICD-10-CM

## 2020-03-05 DIAGNOSIS — M25561 Pain in right knee: Secondary | ICD-10-CM

## 2020-03-05 NOTE — Therapy (Signed)
[place] MAIN [facility] SERVICES [address] [place], [place], [address] Phone: [phone]   Fax:  [phone]  Physical Therapy Treatment  Patient Details  Name: Angela Bentley MRN: [id number] Date of Birth: 1941/08/30 Referring Provider (PT): Meredith Pel, MD   Encounter Date: 03/05/2020   PT End of Session - 03/05/20 1144    Visit Number 12    Number of Visits 17    Date for PT Re-Evaluation 03/17/20    PT Start Time 1146    PT Stop Time 1230    PT Time Calculation (min) 44 min    Activity Tolerance Patient tolerated treatment well;No increased pain    Behavior During Therapy WFL for tasks assessed/performed           Past Medical History:  Diagnosis Date  . Balance problems   . Brain tumor ([place])    a. Left intraventricular tumor ->stable by 06/2017 MRI. Followed @ [facility].  . Gait disturbance    a. Unsteady on feet/balance difficulty.  . Generalized osteoarthritis of multiple sites   . GERD (gastroesophageal reflux disease)   . History of DVT (deep vein thrombosis) 2016   estrogen and airflights. Rx with xarelto for 3 months  . History of stress test    a. 11/2016 ETT: No ST/T changes.  Performed to assess PAC/PVC burden with activity ->No ectopy noted.  . Hypertension   . Hyperthyroidism    treated briefly in college  . Insomnia   . Obesity   . PAF (paroxysmal atrial fibrillation) ([place])    a. 12/2016 Event Monitor: brief run of PAF-->CHA2DS2VASc = 5--> Xarelto.  . Plantar fasciitis   . Retinal tear of left eye   . Rosacea   . Type 2 diabetes, controlled, with peripheral neuropathy ([place])    a. 11/2016 A1c 7.4.  . Uterine fibroid     Past Surgical History:  Procedure Laterality Date  . CARPAL TUNNEL RELEASE Right    Dr Burney Gauze  . CATARACT EXTRACTION    . RETINAL TEAR REPAIR CRYOTHERAPY     10/09/13, then again 3/15    There were no vitals filed for this visit.   Subjective Assessment - 03/05/20 1153     Subjective Patient reports no pain currently; She reports soreness after last session with increased swelling on right lateral knee yesterday but states that it is a little better today; She didn't do any resisted exercise but did do some walking;    Pertinent History 79 yo Female reports increased right knee pain since December 2020. She was found to have right meniscus tear. She is hoping to treat conservatively and avoid surgery; She did get cortisone injection in January, with no significant relief. She did get gel injection approximately 4 weeks ago and reports just now feeling relief from that. She reports increased RLE lateral thigh pain. She reports pain does prevent sleeping with patient having trouble getting in a comfortable position. In addition she also reports increased posterior LLE knee discomfort either from piriformis syndrome or possible cyst in LLE knee. She presents to therapy without AD but does report decreased activity level due to fatigue from not sleeping well. She reports only sleeping for 3-4 hours at the most and then will have to get up and move around. She has been walking the dog for short walks. She admits she has not been exercising much and reports feeling out of shape. PMH: significant for DMx2 (controlled), HTN (controlled), chronic dizziness/impaired vision in  left eye, imbalance, A-fib (on blood thinner),    How long can you sit comfortably? variable; 10 min or 1 hour    How long can you stand comfortably? Short time    How long can you walk comfortably? 0.25 miles    Diagnostic tests MRI in March 2020 shows Horizontal tear anterior body of the medial meniscus    Patient Stated Goals "Improving standing position, reduce knee valgus position, reduce pain especially with improving sleep, increase endurance    Currently in Pain? Yes    Pain Score 2     Pain Location Knee    Pain Orientation Right    Pain Descriptors / Indicators Aching;Sore    Pain Type Acute pain     Pain Onset Yesterday    Pain Frequency Intermittent    Aggravating Factors  tender to touch    Pain Relieving Factors unsure    Effect of Pain on Daily Activities no change;    Multiple Pain Sites No    Pain Onset 1 to 4 weeks ago                   TREATMENT: Gait on treadmill:  1.5-1.8 mph x5 min x2 sets with 2 HHA with cues to increase step length and improve ankle DF at heel strike especially on LLE; Patient does become short of breath following gait, Spo2 98%, HR 103 bpm;   Leg press: BLE55# 2x20with cues for proper positioning; Patient able to exhibit good control without pain or discomfort;  Standing withgreentband around BLE above knee: Side stepping x10 feet x 2 laps each direction with min VCS to avoid stooping, increase core stabilization and improve foot placement for better hip abductor strengthening; Monster walks forward/backward x10 feet x2 laps each direction; Patient required min-moderate verbal/tactile cues for correct exercise technique. She also required supervision for safety with mild imbalance noted;  Patient seated: PT inspected RLE knee with increased tightness noted along right distal/lateral knee; PT performed soft tissue massage utilizing edge tool for IASTMto help reduce stiffness/soreness x10 min; Patient tolerated well reporting less discomfort at end of session;   Patient toleratedsessionwell. She reports increased flexibility with less tightness. Reinforced HEP;                     PT Education - 03/05/20 1144    Education Details LE strengthening, HEP, positioning;    Person(s) Educated Patient    Methods Explanation;Verbal cues    Comprehension Verbalized understanding;Returned demonstration;Verbal cues required;Need further instruction            PT Short Term Goals - 02/18/20 1113      PT SHORT TERM GOAL #1   Title Patient will be adherent to HEP at least 3x a week to improve functional strength and  balance for better safety at home.    Baseline 5/25: not done much this last week;    Time 4    Period Weeks    Status Not Met    Target Date 03/17/20      PT SHORT TERM GOAL #2   Title Patient will increase RLE knee AROM extension to within 5 degrees of neutral for better standing and walking tolerance;     Time 4    Period Weeks    Status Partially Met    Target Date 03/17/20      PT SHORT TERM GOAL #3   Title Patient will reports maximum of 1 sleep disturbances per night over  last 3 days to exhibit improved sleeping tolerance with less knee pain and reduced fatigue.     Baseline 5/25: once or less, not very often;    Time 4    Period Weeks    Status Achieved    Target Date 01/22/20             PT Long Term Goals - 02/18/20 1114      PT LONG TERM GOAL #1   Title Patient will increase BLE gross strength to 4+/5 as to improve functional strength for independent gait, increased standing tolerance and increased ADL ability.    Baseline 5/25: see flowsheet    Time 4    Period Weeks    Status Partially Met    Target Date 03/17/20      PT LONG TERM GOAL #2   Title Patient will improve FOTO score to >55/100 to indicate improved functional mobility with ADLs.    Baseline 4/7: 51/100, 5/25: 50/100    Time 4    Period Weeks    Status Not Met    Target Date 03/17/20      PT LONG TERM GOAL #3   Title Patient will report a worst pain of 3/10 on VAS in      right knee       to improve tolerance with ADLs and reduced symptoms with activities.     Baseline 5/25: 3-4/10 in right knee;    Time 4    Period Weeks    Status Partially Met    Target Date 03/17/20      PT LONG TERM GOAL #4   Title Patient will complete >1000 feet on 6 min walk test without increase in knee pain to improve community ambulator distance for functional tasks and return to PLOF.    Baseline 5/25: 1150 feet    Time 4    Period Weeks    Status Achieved    Target Date 03/17/20      PT LONG TERM GOAL #5    Title Patient (> 34 years old) will complete five times sit to stand test in < 15 seconds indicating an increased LE strength and improved balance.    Baseline 5/25: 15.15 sec    Time 4    Period Weeks    Status Partially Met    Target Date 03/17/20                 Plan - 03/05/20 1458    Clinical Impression Statement Patient motivated and participated well within session. Patient instructed in advanced LE strengthening to challenge LE quad control. Patient does require min VCs for proper positioning/exercise technique. Instructed patient in unsupported side stepping to challenge dynamic balance. Patient responded well to manual therapy with reduction in knee pain reported. She would benefit from additional skilled PT intervention to improve strength, balance and mobility;    Personal Factors and Comorbidities Age;Comorbidity 3+;Past/Current Experience    Comorbidities PMH: significant for DMx2 (controlled), HTN (controlled), chronic dizziness/impaired vision in left eye, imbalance, A-fib (on blood thinner),    Examination-Activity Limitations Bed Mobility;Caring for Others;Lift;Locomotion Level;Sit;Sleep;Squat;Stairs;Stand;Transfers    Examination-Participation Restrictions Church;Cleaning;Community Activity;Shop;Volunteer;Yard Work    [employer] Evolving/Moderate complexity    Rehab Potential Good    PT Frequency 2x / week    PT Duration 4 weeks    PT Treatment/Interventions ADLs/Self Care Home Management;Cryotherapy;Electrical Stimulation;Moist Heat;Gait training;Stair training;Functional mobility training;Therapeutic activities;Therapeutic exercise;Balance training;Neuromuscular re-education;Patient/family education;Manual techniques;Passive range of motion;Dry needling;Energy conservation;Taping  PT Next Visit Plan assess strength, 6 min walk test, initiate HEP    PT Home Exercise Plan will address next session;    Consulted and Agree with Plan of Care  Patient           Patient will benefit from skilled therapeutic intervention in order to improve the following deficits and impairments:  Abnormal gait, Decreased balance, Decreased endurance, Decreased mobility, Difficulty walking, Improper body mechanics, Decreased range of motion, Decreased activity tolerance, Decreased strength, Pain  Visit Diagnosis: Acute pain of right knee  Muscle weakness (generalized)     Problem List Patient Active Problem List   Diagnosis Date Noted  . Trapezius strain, left, initial encounter 02/21/2020  . Left-sided chest pain 10/23/2019  . Overactive bladder 10/09/2019  . Lumbar sprain 03/28/2019  . Tinnitus, bilateral 11/28/2017  . Abnormal MRI of head 11/14/2017  . Meningioma ([place]) 09/21/2017  . PAF (paroxysmal atrial fibrillation) ([place]) 02/03/2017  . Preventative health care 06/02/2016  . Advance directive discussed with patient 06/02/2016  . Achilles tendonitis 03/11/2016  . Hypertension   . DVT, lower extremity, recurrent ([place])   . Type 2 diabetes, controlled, with peripheral neuropathy ([place])   . Generalized osteoarthritis of multiple sites   . GERD (gastroesophageal reflux disease)   . Hyperthyroidism   . Retinal hole of right eye 06/26/2014  . Amblyopia, right 05/08/2014  . Status post intraocular lens implant 05/08/2014    Jadee Golebiewski PT, DPT 03/05/2020, 3:13 PM  [place] MAIN [facility] SERVICES [address] [place], [place], [address] Phone: [phone]   Fax:  [phone]  Name: Angela Bentley MRN: [id number] Date of Birth: 11-Feb-1941

## 2020-03-10 ENCOUNTER — Encounter: Payer: Self-pay | Admitting: Physical Therapy

## 2020-03-10 ENCOUNTER — Other Ambulatory Visit: Payer: Self-pay

## 2020-03-10 ENCOUNTER — Ambulatory Visit: Payer: Medicare Other | Admitting: Physical Therapy

## 2020-03-10 DIAGNOSIS — R262 Difficulty in walking, not elsewhere classified: Secondary | ICD-10-CM

## 2020-03-10 DIAGNOSIS — R2681 Unsteadiness on feet: Secondary | ICD-10-CM

## 2020-03-10 DIAGNOSIS — M6281 Muscle weakness (generalized): Secondary | ICD-10-CM

## 2020-03-10 DIAGNOSIS — M25561 Pain in right knee: Secondary | ICD-10-CM

## 2020-03-11 NOTE — Therapy (Signed)
[place] MAIN [facility] [address] [place], [place], [address] Phone: [phone]   Fax:  [phone]  Physical Therapy ReEvaluation  Patient Details  Name: Angela Bentley MRN: [id number] Date of Birth: [birth year] Referring Provider (PT): [name], MD   Encounter Date: 03/10/2020   PT End of Session - 03/11/20 0812    Visit Number 13    Number of Visits 25    Date for PT Re-Evaluation 04/08/20    PT Start Time 2505    PT Stop Time 1230    PT Time Calculation (min) 43 min    Equipment Utilized During Treatment Gait belt    Activity Tolerance Patient tolerated treatment well;No increased pain    Behavior During Therapy WFL for tasks assessed/performed           Past Medical History:  Diagnosis Date  . Balance problems   . Brain tumor ([place])    a. Left intraventricular tumor ->stable by 06/2017 MRI. Followed @ [facility].  . Gait disturbance    a. Unsteady on feet/balance difficulty.  . Generalized osteoarthritis of multiple sites   . GERD (gastroesophageal reflux disease)   . History of DVT (deep vein thrombosis) 2016   estrogen and airflights. Rx with xarelto for 3 months  . History of stress test    a. 11/2016 ETT: No ST/T changes.  Performed to assess PAC/PVC burden with activity ->No ectopy noted.  . Hypertension   . Hyperthyroidism    treated briefly in college  . Insomnia   . Obesity   . PAF (paroxysmal atrial fibrillation) ([place])    a. 12/2016 Event Monitor: brief run of PAF-->CHA2DS2VASc = 5--> Xarelto.  . Plantar fasciitis   . Retinal tear of left eye   . Rosacea   . Type 2 diabetes, controlled, with peripheral neuropathy ([place])    a. 11/2016 A1c 7.4.  . Uterine fibroid     Past Surgical History:  Procedure Laterality Date  . CARPAL TUNNEL RELEASE Right    Dr Burney Gauze  . CATARACT EXTRACTION    . RETINAL TEAR REPAIR CRYOTHERAPY     10/09/13, then again 3/15    There were no vitals filed for this visit.     Subjective Assessment - 03/10/20 1152    Subjective Patient reports doing well at the [place] with no difficulty negotiating steps; Still doing well with knee pain; She reports not having air conditioning and as a result not sleeping well. She is still having imbalance.    Pertinent History 79 yo Female reports imbalance several years. She has a brain tumor on right parietal lobe which her MD states has nothing to do with imbalance; She does reports having increased headaches in the area over last 3 mornings but feels like coffee will help resolve symptoms; She has had hearing checked recently which the audiologist said that her hearing is fine but right ear is slightly better than left ear; She does have tinnitus in left ear;  She has also had an eye exam recently with no significant change in vision; 3 years ago this started after [place]. She saw Dr. Pryor Ochoa who couldn't find anything significant but was concerned she could have had a viral infection that could have killed off some nerve cells in left ear. She has had a few falls in last year. She denies any numbness/tingling in BLE but does have a PMH significant of diabetes;    How long can you sit comfortably?  variable; 10 min or 1 hour    How long can you stand comfortably? Short time    How long can you walk comfortably? 0.25 miles    Diagnostic tests MRI in March 2020 shows Horizontal tear anterior body of the medial meniscus    Patient Stated Goals "Improving standing position, reduce knee valgus position, reduce pain especially with improving sleep, increase endurance    Currently in Pain? No/denies    Pain Onset Yesterday    Multiple Pain Sites No    Pain Onset 1 to 4 weeks ago              [facility] PT Assessment - 03/11/20 0001      Assessment   Medical Diagnosis Imbalance    Referring Provider (PT) [name], MD    Onset Date/Surgical Date --   several years   Hand Dominance Right    Prior Therapy has had PT for  right knee pain, been several years for balance;       Balance Screen   Has the patient fallen in the past 6 months Yes    How many times? 1    Has the patient had a decrease in activity level because of a fear of falling?  Yes    Is the patient reluctant to leave their home because of a fear of falling?  No      Home Environment   Additional Comments lives in single story independent living community with husband, mod I for self care ADLs      Prior Function   Level of Independence Independent    Vocation Retired    Leisure likes to walk the dog, socialize with friends      Cognition   Overall Cognitive Status Within Functional Limits for tasks assessed      Sensation   Light Touch Appears Intact    Proprioception Appears Intact      Coordination   Gross Motor Movements are Fluid and Coordinated Yes    Fine Motor Movements are Fluid and Coordinated Yes      Posture/Postural Control   Posture Comments WFL      AROM   Overall AROM Comments WFL      Strength   Right Hip Flexion 4+/5    Right Hip Extension 4/5    Right Hip ABduction 4-/5    Right Hip ADduction 3+/5    Left Hip Flexion 4+/5    Left Hip Extension 4-/5    Left Hip ABduction 4-/5    Left Hip ADduction 3+/5    Right Knee Flexion 4+/5    Right Knee Extension 4+/5    Left Knee Flexion 4+/5    Left Knee Extension 4+/5    Right Ankle Dorsiflexion 4/5    Left Ankle Dorsiflexion 4-/5      Transfers   Comments requires HHA to push up from chair      Ambulation/Gait   Gait Comments ambualtes with reciprocal gait pattern, occasional foot drag on LLE with decreased heel strike; does exhibit veering side/side especially when distracted or when feeling unsteady;       High Level Balance   High Level Balance Comments Standing on firm surface Feet together: eyes open: no sway, eyes closed: no sway; Standing on foam pad, feet together: eyes open, mild sway, eyes closed moderate sway but able to keep balance; Marching  in place with eyes closed patient does exhibit veering/turning to left side;      Functional Gait  Assessment   Gait assessed  Yes    Gait Level Surface Walks 20 ft in less than 5.5 sec, no assistive devices, good speed, no evidence for imbalance, normal gait pattern, deviates no more than 6 in outside of the 12 in walkway width.    Change in Gait Speed Able to smoothly change walking speed without loss of balance or gait deviation. Deviate no more than 6 in outside of the 12 in walkway width.    Gait with Horizontal Head Turns Performs head turns smoothly with slight change in gait velocity (eg, minor disruption to smooth gait path), deviates 6-10 in outside 12 in walkway width, or uses an assistive device.    Gait with Vertical Head Turns Performs task with moderate change in gait velocity, slows down, deviates 10-15 in outside 12 in walkway width but recovers, can continue to walk.    Gait and Pivot Turn Pivot turns safely within 3 sec and stops quickly with no loss of balance.    Step Over Obstacle Is able to step over one shoe box (4.5 in total height) but must slow down and adjust steps to clear box safely. May require verbal cueing.    Gait with Narrow Base of Support Ambulates less than 4 steps heel to toe or cannot perform without assistance.    Gait with Eyes Closed Walks 20 ft, slow speed, abnormal gait pattern, evidence for imbalance, deviates 10-15 in outside 12 in walkway width. Requires more than 9 sec to ambulate 20 ft.    Ambulating Backwards Walks 20 ft, uses assistive device, slower speed, mild gait deviations, deviates 6-10 in outside 12 in walkway width.    Steps Alternating feet, must use rail.    Total Score 18    FGA comment: high fall risk                   TREATMENT: Patient instructed in HEP for balance activities: Standing in corner, feet together eyes closed: Head turns side/side x10 reps; Head turns up/down x10 reps; Educated patient to move head slowly  to reduce dizziness and to help reduce imbalance;    Objective measurements completed on examination: See above findings.               PT Education - 03/11/20 0811    Education Details balance assessment, HEP    Person(s) Educated Patient    Methods Explanation;Verbal cues    Comprehension Verbalized understanding;Returned demonstration;Verbal cues required;Need further instruction            PT Short Term Goals - 03/11/20 0818      PT SHORT TERM GOAL #1   Title Patient will be adherent to HEP at least 3x a week to improve functional strength and balance for better safety at home.    Baseline 5/25: not done much this last week;    Time 4    Period Weeks    Status Not Met    Target Date 03/17/20      PT SHORT TERM GOAL #2   Title Patient will increase RLE knee AROM extension to within 5 degrees of neutral for better standing and walking tolerance;     Time 4    Period Weeks    Status Partially Met    Target Date 03/17/20      PT SHORT TERM GOAL #3   Title Patient will reports maximum of 1 sleep disturbances per night over last 3 days to exhibit improved sleeping tolerance with  less knee pain and reduced fatigue.     Baseline 5/25: once or less, not very often;    Time 4    Period Weeks    Status Achieved    Target Date 01/22/20             PT Long Term Goals - 03/11/20 0819      PT LONG TERM GOAL #1   Title Patient will increase BLE gross strength to 4+/5 as to improve functional strength for independent gait, increased standing tolerance and increased ADL ability.    Baseline 5/25: see flowsheet    Time 4    Period Weeks    Status Partially Met    Target Date 04/08/20      PT LONG TERM GOAL #2   Title Patient will improve FOTO score to >55/100 to indicate improved functional mobility with ADLs.    Baseline 4/7: 51/100, 5/25: 50/100    Time 4    Period Weeks    Status Not Met    Target Date 04/08/20      PT LONG TERM GOAL #3   Title Patient  will report a worst pain of 3/10 on VAS in      right knee       to improve tolerance with ADLs and reduced symptoms with activities.     Baseline 5/25: 3-4/10 in right knee;    Time 4    Period Weeks    Status Partially Met    Target Date 04/08/20      PT LONG TERM GOAL #4   Title Patient will complete >1000 feet on 6 min walk test without increase in knee pain to improve community ambulator distance for functional tasks and return to PLOF.    Baseline 5/25: 1150 feet    Time 4    Period Weeks    Status Achieved    Target Date 04/08/20      PT LONG TERM GOAL #5   Title Patient (> 16 years old) will complete five times sit to stand test in < 15 seconds indicating an increased LE strength and improved balance.    Baseline 5/25: 15.15 sec    Time 4    Period Weeks    Status Partially Met    Target Date 04/08/20      PT LONG TERM GOAL #6   Title Patient will increase Functional Gait Assessment score to >20/30 as to reduce fall risk and improve dynamic gait safety with community ambulation.    Time 4    Period Weeks    Status New    Target Date 04/08/20                  Plan - 03/11/20 7622    Clinical Impression Statement Patient reports overall feeling better in RLE knee/hip. She still has occasional discomfort but denies any pain at night. She does however, report increased imbalance and feeling unsteady. Patient has a history of imbalance over last several years. She has had 1 fall recently. Patient did test as a high fall risk as evidenced with functional gait assessment; She denies any vertigo symptoms but does report feeling unsteady. Patient does exhibit increased unsteadiness when standing on foam surface with eyes closed. She also exhibits increased veering when walking/moving with eyes closed indicating possible inner ear dysfunction. Patient would benefit from skilled PT Intervention to improve balance/mobility while reducing right knee pain;    Personal Factors and  Comorbidities Age;Comorbidity 3+;Past/Current Experience  Comorbidities PMH: significant for DMx2 (controlled), HTN (controlled), chronic dizziness/impaired vision in left eye, imbalance, A-fib (on blood thinner),    Examination-Activity Limitations Bed Mobility;Caring for Others;Lift;Locomotion Level;Sit;Sleep;Squat;Stairs;Stand;Transfers    Examination-Participation Restrictions Church;Cleaning;Community Activity;Shop;Volunteer;Yard Work    [employer] Evolving/Moderate complexity    Rehab Potential Good    PT Frequency 2x / week    PT Duration 4 weeks    PT Treatment/Interventions ADLs/Self Care Home Management;Cryotherapy;Electrical Stimulation;Moist Heat;Gait training;Stair training;Functional mobility training;Therapeutic activities;Therapeutic exercise;Balance training;Neuromuscular re-education;Patient/family education;Manual techniques;Passive range of motion;Dry needling;Energy conservation;Taping    PT Next Visit Plan work on balance    PT Home Exercise Plan advanced with balance;    Consulted and Agree with Plan of Care Patient           Patient will benefit from skilled therapeutic intervention in order to improve the following deficits and impairments:  Abnormal gait, Decreased balance, Decreased endurance, Decreased mobility, Difficulty walking, Improper body mechanics, Decreased range of motion, Decreased activity tolerance, Decreased strength, Pain, Dizziness  Visit Diagnosis: Acute pain of right knee  Muscle weakness (generalized)  Unsteadiness on feet  Difficulty in walking, not elsewhere classified     Problem List Patient Active Problem List   Diagnosis Date Noted  . Trapezius strain, left, initial encounter 02/21/2020  . Left-sided chest pain 10/23/2019  . Overactive bladder 10/09/2019  . Lumbar sprain 03/28/2019  . Tinnitus, bilateral 11/28/2017  . Abnormal MRI of head 11/14/2017  . Meningioma ([place]) 09/21/2017  . PAF (paroxysmal  atrial fibrillation) ([place]) 02/03/2017  . Preventative health care 06/02/2016  . Advance directive discussed with patient 06/02/2016  . Achilles tendonitis 03/11/2016  . Hypertension   . DVT, lower extremity, recurrent ([place])   . Type 2 diabetes, controlled, with peripheral neuropathy ([place])   . Generalized osteoarthritis of multiple sites   . GERD (gastroesophageal reflux disease)   . Hyperthyroidism   . Retinal hole of right eye 06/26/2014  . Amblyopia, right 05/08/2014  . Status post intraocular lens implant 05/08/2014    Charmane Protzman PT, DPT 03/11/2020, 8:22 AM  [place] MAIN [facility] SERVICES [address] [place], [place], [address] Phone: [phone]   Fax:  [phone]  Name: Araminta Zorn MRN: [id number] Date of Birth: 26-Mar-1941

## 2020-03-12 ENCOUNTER — Ambulatory Visit: Payer: Medicare Other | Admitting: Physical Therapy

## 2020-03-12 ENCOUNTER — Encounter: Payer: Self-pay | Admitting: Physical Therapy

## 2020-03-12 ENCOUNTER — Other Ambulatory Visit: Payer: Self-pay

## 2020-03-12 DIAGNOSIS — R2681 Unsteadiness on feet: Secondary | ICD-10-CM

## 2020-03-12 DIAGNOSIS — M6281 Muscle weakness (generalized): Secondary | ICD-10-CM

## 2020-03-12 DIAGNOSIS — R262 Difficulty in walking, not elsewhere classified: Secondary | ICD-10-CM

## 2020-03-12 DIAGNOSIS — M25561 Pain in right knee: Secondary | ICD-10-CM

## 2020-03-12 NOTE — Therapy (Signed)
[place] MAIN [facility] SERVICES [address] [place], [place], [address] Phone: [phone]   Fax:  [phone]  Physical Therapy Treatment  Patient Details  Name: Angela Bentley MRN: [id number] Date of Birth: 07-09-1941 Referring Provider (PT): [name], MD   Encounter Date: 03/12/2020   PT End of Session - 03/12/20 1146    Visit Number 14    Number of Visits 25    Date for PT Re-Evaluation 04/08/20    PT Start Time 1146    PT Stop Time 1230    PT Time Calculation (min) 44 min    Equipment Utilized During Treatment Gait belt    Activity Tolerance Patient tolerated treatment well;No increased pain    Behavior During Therapy WFL for tasks assessed/performed           Past Medical History:  Diagnosis Date  . Balance problems   . Brain tumor ([place])    a. Left intraventricular tumor ->stable by 06/2017 MRI. Followed @ [facility].  . Gait disturbance    a. Unsteady on feet/balance difficulty.  . Generalized osteoarthritis of multiple sites   . GERD (gastroesophageal reflux disease)   . History of DVT (deep vein thrombosis) 2016   estrogen and airflights. Rx with xarelto for 3 months  . History of stress test    a. 11/2016 ETT: No ST/T changes.  Performed to assess PAC/PVC burden with activity ->No ectopy noted.  . Hypertension   . Hyperthyroidism    treated briefly in college  . Insomnia   . Obesity   . PAF (paroxysmal atrial fibrillation) ([place])    a. 12/2016 Event Monitor: brief run of PAF-->CHA2DS2VASc = 5--> Xarelto.  . Plantar fasciitis   . Retinal tear of left eye   . Rosacea   . Type 2 diabetes, controlled, with peripheral neuropathy ([place])    a. 11/2016 A1c 7.4.  . Uterine fibroid     Past Surgical History:  Procedure Laterality Date  . CARPAL TUNNEL RELEASE Right    Dr Burney Gauze  . CATARACT EXTRACTION    . RETINAL TEAR REPAIR CRYOTHERAPY     10/09/13, then again 3/15    There were no vitals filed for this visit.    Subjective Assessment - 03/12/20 1150    Subjective Patient reports some imbalance today. She reports getting a good nights sleep last night. denies any pain;    Pertinent History 79 yo Female reports imbalance several years. She has a brain tumor on right parietal lobe which her MD states has nothing to do with imbalance; She does reports having increased headaches in the area over last 3 mornings but feels like coffee will help resolve symptoms; She has had hearing checked recently which the audiologist said that her hearing is fine but right ear is slightly better than left ear; She does have tinnitus in left ear;  She has also had an eye exam recently with no significant change in vision; 3 years ago this started after [place]. She saw Dr. Pryor Ochoa who couldn't find anything significant but was concerned she could have had a viral infection that could have killed off some nerve cells in left ear. She has had a few falls in last year. She denies any numbness/tingling in BLE but does have a PMH significant of diabetes;    How long can you sit comfortably? variable; 10 min or 1 hour    How long can you stand comfortably? Short time  How long can you walk comfortably? 0.25 miles    Diagnostic tests MRI in March 2020 shows Horizontal tear anterior body of the medial meniscus    Patient Stated Goals "Improving standing position, reduce knee valgus position, reduce pain especially with improving sleep, increase endurance    Currently in Pain? No/denies    Pain Onset Yesterday    Multiple Pain Sites No    Pain Onset 1 to 4 weeks ago                  TREATMENT: Gait on treadmill: 1.5-1.8 mphx5 min x2 setswith 2 HHA with cues to increase step length and improve ankle DF at heel strike especially on LLE; Patient does become short of breath following gait, Spo2 98%, HR 117 bpm;  Instructed patient in balance exercise: Standing on foam pad: Feet apart: heel raises 3 sec  hold x10 reps with CGA for safety Feet together:  Eyes open/closed 10 sec hold x2 reps;  Eyes open head turns side/side x5 reps with visual cues  Eyes closed head turns side/side x5 reps;  Standing one foot on airex, one foot on 4 inch step:  Eyes closed 10 sec hold x2 reps each with min A for safety;  Eyes open/closed BUE wand flexion x5 reps each  Eyes open BUE ball pass side/side x10 reps each foot on step; Patient required min VCs for balance stability, including to increase trunk control for less loss of balance with staggered stance;    Standing on firm surface eyes closed turning head side/side x5 reps with minimal sway noted;  Exercise: Side step ups airex to 6 inch step with finger tip hold x10 reps; Standing facing forward, heel tap down x10 reps each LE with B rail assist   Sit<>Stand with arm across chest x5 reps with eyes open; rested Progressed to sit<>Stand with arms across chest with eyes closed x5 reps, min A required;   Patient toleratedsessionwell. She did have increased difficulty with lateral head turns especially with eyes closed. Patient also had increased difficulty with staggered stance with airex/step requiring min A for safety;                        PT Education - 03/12/20 1146    Education Details balance/strength, HEP    Person(s) Educated Patient    Methods Explanation;Verbal cues    Comprehension Verbalized understanding;Returned demonstration;Verbal cues required;Need further instruction            PT Short Term Goals - 03/11/20 0818      PT SHORT TERM GOAL #1   Title Patient will be adherent to HEP at least 3x a week to improve functional strength and balance for better safety at home.    Baseline 5/25: not done much this last week;    Time 4    Period Weeks    Status Not Met    Target Date 03/17/20      PT SHORT TERM GOAL #2   Title Patient will increase RLE knee AROM extension to within 5 degrees of neutral  for better standing and walking tolerance;     Time 4    Period Weeks    Status Partially Met    Target Date 03/17/20      PT SHORT TERM GOAL #3   Title Patient will reports maximum of 1 sleep disturbances per night over last 3 days to exhibit improved sleeping tolerance with less knee pain and  reduced fatigue.     Baseline 5/25: once or less, not very often;    Time 4    Period Weeks    Status Achieved    Target Date 01/22/20             PT Long Term Goals - 03/11/20 0819      PT LONG TERM GOAL #1   Title Patient will increase BLE gross strength to 4+/5 as to improve functional strength for independent gait, increased standing tolerance and increased ADL ability.    Baseline 5/25: see flowsheet    Time 4    Period Weeks    Status Partially Met    Target Date 04/08/20      PT LONG TERM GOAL #2   Title Patient will improve FOTO score to >55/100 to indicate improved functional mobility with ADLs.    Baseline 4/7: 51/100, 5/25: 50/100    Time 4    Period Weeks    Status Not Met    Target Date 04/08/20      PT LONG TERM GOAL #3   Title Patient will report a worst pain of 3/10 on VAS in      right knee       to improve tolerance with ADLs and reduced symptoms with activities.     Baseline 5/25: 3-4/10 in right knee;    Time 4    Period Weeks    Status Partially Met    Target Date 04/08/20      PT LONG TERM GOAL #4   Title Patient will complete >1000 feet on 6 min walk test without increase in knee pain to improve community ambulator distance for functional tasks and return to PLOF.    Baseline 5/25: 1150 feet    Time 4    Period Weeks    Status Achieved    Target Date 04/08/20      PT LONG TERM GOAL #5   Title Patient (> 54 years old) will complete five times sit to stand test in < 15 seconds indicating an increased LE strength and improved balance.    Baseline 5/25: 15.15 sec    Time 4    Period Weeks    Status Partially Met    Target Date 04/08/20      PT  LONG TERM GOAL #6   Title Patient will increase Functional Gait Assessment score to >20/30 as to reduce fall risk and improve dynamic gait safety with community ambulation.    Time 4    Period Weeks    Status New    Target Date 04/08/20                 Plan - 03/12/20 1258    Clinical Impression Statement Patient motivated and participated well within session. She was instructed in advanced balance tasks. She did have increased difficulty with head turns especially laterally; Patient was able to exhibit better stance control on firm surface compared to compliant. Patient denies any increase in knee pain throughout session. Patient would benefit from additional skilled PT intervention to improve strength, balance and mobility;    Personal Factors and Comorbidities Age;Comorbidity 3+;Past/Current Experience    Comorbidities PMH: significant for DMx2 (controlled), HTN (controlled), chronic dizziness/impaired vision in left eye, imbalance, A-fib (on blood thinner),    Examination-Activity Limitations Bed Mobility;Caring for Others;Lift;Locomotion Level;Sit;Sleep;Squat;Stairs;Stand;Transfers    Examination-Participation Restrictions Church;Cleaning;Community Activity;Shop;Volunteer;Yard Work    [employer] Evolving/Moderate complexity    Rehab Potential Good  PT Frequency 2x / week    PT Duration 4 weeks    PT Treatment/Interventions ADLs/Self Care Home Management;Cryotherapy;Electrical Stimulation;Moist Heat;Gait training;Stair training;Functional mobility training;Therapeutic activities;Therapeutic exercise;Balance training;Neuromuscular re-education;Patient/family education;Manual techniques;Passive range of motion;Dry needling;Energy conservation;Taping    PT Next Visit Plan work on balance    PT Home Exercise Plan advanced with balance;    Consulted and Agree with Plan of Care Patient           Patient will benefit from skilled therapeutic intervention in  order to improve the following deficits and impairments:  Abnormal gait, Decreased balance, Decreased endurance, Decreased mobility, Difficulty walking, Improper body mechanics, Decreased range of motion, Decreased activity tolerance, Decreased strength, Pain, Dizziness  Visit Diagnosis: Acute pain of right knee  Muscle weakness (generalized)  Unsteadiness on feet  Difficulty in walking, not elsewhere classified     Problem List Patient Active Problem List   Diagnosis Date Noted  . Trapezius strain, left, initial encounter 02/21/2020  . Left-sided chest pain 10/23/2019  . Overactive bladder 10/09/2019  . Lumbar sprain 03/28/2019  . Tinnitus, bilateral 11/28/2017  . Abnormal MRI of head 11/14/2017  . Meningioma ([place]) 09/21/2017  . PAF (paroxysmal atrial fibrillation) ([place]) 02/03/2017  . Preventative health care 06/02/2016  . Advance directive discussed with patient 06/02/2016  . Achilles tendonitis 03/11/2016  . Hypertension   . DVT, lower extremity, recurrent ([place])   . Type 2 diabetes, controlled, with peripheral neuropathy ([place])   . Generalized osteoarthritis of multiple sites   . GERD (gastroesophageal reflux disease)   . Hyperthyroidism   . Retinal hole of right eye 06/26/2014  . Amblyopia, right 05/08/2014  . Status post intraocular lens implant 05/08/2014    Keiaira Donlan PT, DPT 03/12/2020, 1:01 PM  [place] MAIN [facility] SERVICES [address] [place], [place], [address] Phone: [phone]   Fax:  [phone]  Name: Angela Bentley MRN: [id number] Date of Birth: 17-Jan-1941

## 2020-03-13 DIAGNOSIS — H10413 Chronic giant papillary conjunctivitis, bilateral: Secondary | ICD-10-CM | POA: Diagnosis not present

## 2020-03-16 MED ORDER — HYDROCODONE-HOMATROPINE 5-1.5 MG/5ML PO SYRP: 5.0000 mL | ORAL_SOLUTION | Freq: Every evening | ORAL | 0 refills | Status: DC | PRN

## 2020-03-16 NOTE — Telephone Encounter (Signed)
It is currently not on her medication list.

## 2020-03-17 ENCOUNTER — Encounter: Payer: Self-pay | Admitting: Physical Therapy

## 2020-03-17 ENCOUNTER — Other Ambulatory Visit: Payer: Self-pay

## 2020-03-17 ENCOUNTER — Ambulatory Visit: Payer: Medicare Other | Admitting: Physical Therapy

## 2020-03-17 DIAGNOSIS — M25561 Pain in right knee: Secondary | ICD-10-CM

## 2020-03-17 DIAGNOSIS — R262 Difficulty in walking, not elsewhere classified: Secondary | ICD-10-CM

## 2020-03-17 DIAGNOSIS — M6281 Muscle weakness (generalized): Secondary | ICD-10-CM

## 2020-03-17 DIAGNOSIS — R2681 Unsteadiness on feet: Secondary | ICD-10-CM

## 2020-03-17 NOTE — Therapy (Signed)
[facility] SERVICES [address] [place], [place], [address] Phone: [phone]   Fax:  [phone]  Physical Therapy Treatment  Patient Details  Name: Angela Bentley MRN: [id number] Date of Birth: 01-18-1941 Referring Provider (PT): Angela Pate, MD   Encounter Date: 03/17/2020   PT End of Session - 03/17/20 1155    Visit Number 15    Number of Visits 25    Date for PT Re-Evaluation 04/08/20    PT Start Time 1146    PT Stop Time 1230    PT Time Calculation (min) 44 min    Equipment Utilized During Treatment Gait belt    Activity Tolerance Patient tolerated treatment well;No increased pain    Behavior During Therapy WFL for tasks assessed/performed           Past Medical History:  Diagnosis Date  . Balance problems   . Brain tumor ([place])    a. Left intraventricular tumor ->stable by 06/2017 MRI. Followed @ [facility].  . Gait disturbance    a. Unsteady on feet/balance difficulty.  . Generalized osteoarthritis of multiple sites   . GERD (gastroesophageal reflux disease)   . History of DVT (deep vein thrombosis) 2016   estrogen and airflights. Rx with xarelto for 3 months  . History of stress test    a. 11/2016 ETT: No ST/T changes.  Performed to assess PAC/PVC burden with activity ->No ectopy noted.  . Hypertension   . Hyperthyroidism    treated briefly in college  . Insomnia   . Obesity   . PAF (paroxysmal atrial fibrillation) ([place])    a. 12/2016 Event Monitor: brief run of PAF-->CHA2DS2VASc = 5--> Xarelto.  . Plantar fasciitis   . Retinal tear of left eye   . Rosacea   . Type 2 diabetes, controlled, with peripheral neuropathy ([place])    a. 11/2016 A1c 7.4.  . Uterine fibroid     Past Surgical History:  Procedure Laterality Date  . CARPAL TUNNEL RELEASE Right    Dr Burney Gauze  . CATARACT EXTRACTION    . RETINAL TEAR REPAIR CRYOTHERAPY     10/09/13, then again 3/15    There were no vitals filed for this visit.    Subjective Assessment - 03/17/20 1155    Subjective Patient reports doing well; No trouble with knee pain at night. She reports her balance is more impaired today. She states HEP went well with some days doing well and others having more difficulty;    Pertinent History 79 yo Female reports imbalance several years. She has a brain tumor on right parietal lobe which her MD states has nothing to do with imbalance; She does reports having increased headaches in the area over last 3 mornings but feels like coffee will help resolve symptoms; She has had hearing checked recently which the audiologist said that her hearing is fine but right ear is slightly better than left ear; She does have tinnitus in left ear;  She has also had an eye exam recently with no significant change in vision; 3 years ago this [place]. She saw Dr. Pryor Bentley who couldn't find anything significant but was concerned she could have had a viral infection that could have killed off some nerve cells in left ear. She has had a few falls in last year. She denies any numbness/tingling in BLE but does have a PMH significant of diabetes;    How long can you sit comfortably? variable; 10 min  or 1 hour    How long can you stand comfortably? Short time    How long can you walk comfortably? 0.25 miles    Diagnostic tests MRI in March 2020 shows Horizontal tear anterior body of the medial meniscus    Patient Stated Goals "Improving standing position, reduce knee valgus position, reduce pain especially with improving sleep, increase endurance    Currently in Pain? No/denies    Pain Onset Yesterday    Pain Onset 1 to 4 weeks ago               TREATMENT: Warm up on Nustep BUE/BLE level 2 x4 min (unbilled);   Instructed patient in balance exercise: Standing on foam pad: Feet apart: heel raises 3 sec hold x10 reps with CGA for safety, progressed to eyes closed heel raises 3 sec hold x5 reps;  Feet  together:             Eyes open/closed 10 sec hold x2 reps;             Standing one foot on airex, one foot on 4 inch step:             Eyes closed 10 sec hold x2 reps each with min A for safety;             Eyes closed head turns side/side x5 reps each             Eyes open BUE ball pass side/side x10 reps each foot on step;  Standing on rockerboard:  Feet staggered forward/backward weight shift x2 min with 2-1 rail assist each foot in front, min A for safety;  Patient required min VCs for balance stability, including to increase trunk control for less loss of balance with staggered stance;     Patient toleratedsessionwell. She did have increased difficulty with lateral head turns especially with eyes closed. Patient also had increased difficulty with staggered stance with airex/step requiring min A for safety; Advanced HEP with standing heel raises to improve ankle stability;                           PT Education - 03/17/20 1155    Education Details balance, HEP    Person(s) Educated Patient    Methods Explanation;Verbal cues    Comprehension Verbalized understanding;Returned demonstration;Verbal cues required;Need further instruction            PT Short Term Goals - 03/11/20 0818      PT SHORT TERM GOAL #1   Title Patient will be adherent to HEP at least 3x a week to improve functional strength and balance for better safety at home.    Baseline 5/25: not done much this last week;    Time 4    Period Weeks    Status Not Met    Target Date 03/17/20      PT SHORT TERM GOAL #2   Title Patient will increase RLE knee AROM extension to within 5 degrees of neutral for better standing and walking tolerance;     Time 4    Period Weeks    Status Partially Met    Target Date 03/17/20      PT SHORT TERM GOAL #3   Title Patient will reports maximum of 1 sleep disturbances per night over last 3 days to exhibit improved sleeping tolerance with less  knee pain and reduced fatigue.     Baseline 5/25: once  or less, not very often;    Time 4    Period Weeks    Status Achieved    Target Date 01/22/20             PT Long Term Goals - 03/11/20 0819      PT LONG TERM GOAL #1   Title Patient will increase BLE gross strength to 4+/5 as to improve functional strength for independent gait, increased standing tolerance and increased ADL ability.    Baseline 5/25: see flowsheet    Time 4    Period Weeks    Status Partially Met    Target Date 04/08/20      PT LONG TERM GOAL #2   Title Patient will improve FOTO score to >55/100 to indicate improved functional mobility with ADLs.    Baseline 4/7: 51/100, 5/25: 50/100    Time 4    Period Weeks    Status Not Met    Target Date 04/08/20      PT LONG TERM GOAL #3   Title Patient will report a worst pain of 3/10 on VAS in      right knee       to improve tolerance with ADLs and reduced symptoms with activities.     Baseline 5/25: 3-4/10 in right knee;    Time 4    Period Weeks    Status Partially Met    Target Date 04/08/20      PT LONG TERM GOAL #4   Title Patient will complete >1000 feet on 6 min walk test without increase in knee pain to improve community ambulator distance for functional tasks and return to PLOF.    Baseline 5/25: 1150 feet    Time 4    Period Weeks    Status Achieved    Target Date 04/08/20      PT LONG TERM GOAL #5   Title Patient (> 65 years old) will complete five times sit to stand test in < 15 seconds indicating an increased LE strength and improved balance.    Baseline 5/25: 15.15 sec    Time 4    Period Weeks    Status Partially Met    Target Date 04/08/20      PT LONG TERM GOAL #6   Title Patient will increase Functional Gait Assessment score to >20/30 as to reduce fall risk and improve dynamic gait safety with community ambulation.    Time 4    Period Weeks    Status New    Target Date 04/08/20                 Plan - 03/18/20 1508     Clinical Impression Statement Patient motivated and participated well within session. She continues to have difficulty standing with eyes closed with increased lateral loss of balance. this is exacerbated when standing on compliant surfaces and with dynamic movement. Patient does require CGA to min A for most balance activities. While on rockerboard she does report increased right knee weakness which could be affecting her weight shift. She would benefit from additional skilled PT intervention to improve strength, balance and mobility;    Personal Factors and Comorbidities Age;Comorbidity 3+;Past/Current Experience    Comorbidities PMH: significant for DMx2 (controlled), HTN (controlled), chronic dizziness/impaired vision in left eye, imbalance, A-fib (on blood thinner),    Examination-Activity Limitations Bed Mobility;Caring for Others;Lift;Locomotion Level;Sit;Sleep;Squat;Stairs;Stand;Transfers    Examination-Participation Restrictions Church;Cleaning;Community Activity;Shop;Volunteer;Yard Work    [employer] officer Evolving/Moderate complexity  Rehab Potential Good    PT Frequency 2x / week    PT Duration 4 weeks    PT Treatment/Interventions ADLs/Self Care Home Management;Cryotherapy;Electrical Stimulation;Moist Heat;Gait training;Stair training;Functional mobility training;Therapeutic activities;Therapeutic exercise;Balance training;Neuromuscular re-education;Patient/family education;Manual techniques;Passive range of motion;Dry needling;Energy conservation;Taping    PT Next Visit Plan work on balance    PT Home Exercise Plan advanced with balance;    Consulted and Agree with Plan of Care Patient           Patient will benefit from skilled therapeutic intervention in order to improve the following deficits and impairments:  Abnormal gait, Decreased balance, Decreased endurance, Decreased mobility, Difficulty walking, Improper body mechanics, Decreased range of motion,  Decreased activity tolerance, Decreased strength, Pain, Dizziness  Visit Diagnosis: Acute pain of right knee  Muscle weakness (generalized)  Unsteadiness on feet  Difficulty in walking, not elsewhere classified     Problem List Patient Active Problem List   Diagnosis Date Noted  . Skin tag 03/18/2020  . Trapezius strain, left, initial encounter 02/21/2020  . Left-sided chest pain 10/23/2019  . Overactive bladder 10/09/2019  . Lumbar sprain 03/28/2019  . Tinnitus, bilateral 11/28/2017  . Abnormal MRI of head 11/14/2017  . Meningioma ([place]) 09/21/2017  . PAF (paroxysmal atrial fibrillation) ([place]) 02/03/2017  . Preventative health care 06/02/2016  . Advance directive discussed with patient 06/02/2016  . Achilles tendonitis 03/11/2016  . Hypertension   . DVT, lower extremity, recurrent ([place])   . Type 2 diabetes, controlled, with peripheral neuropathy ([place])   . Generalized osteoarthritis of multiple sites   . GERD (gastroesophageal reflux disease)   . Hyperthyroidism   . Retinal hole of right eye 06/26/2014  . Amblyopia, right 05/08/2014  . Status post intraocular lens implant 05/08/2014    Derrall Hicks PT, DPT 03/18/2020, 3:10 PM  [place] [facility] SERVICES [address] [place], [place], [address] Phone: [phone]   Fax:  [phone]  Name: Ayn Domangue MRN: [id number] Date of Birth: [birth year]

## 2020-03-18 ENCOUNTER — Encounter: Payer: Self-pay | Admitting: Internal Medicine

## 2020-03-18 ENCOUNTER — Ambulatory Visit (INDEPENDENT_AMBULATORY_CARE_PROVIDER_SITE_OTHER): Payer: Medicare Other | Admitting: Internal Medicine

## 2020-03-18 DIAGNOSIS — L918 Other hypertrophic disorders of the skin: Secondary | ICD-10-CM | POA: Diagnosis not present

## 2020-03-18 NOTE — Assessment & Plan Note (Signed)
Near lip Discussed alternatives Verbal consent  Tried silver nitrate--didn't seem to work [place] did seem more separate from the vermillion border so tried liquid nitrogen Rx 20 seconds x 2 Tolerated well Discussed home care

## 2020-03-18 NOTE — Progress Notes (Signed)
Subjective:    Patient ID: Angela Bentley, female    DOB: Dec 20, 1940, 79 y.o.   MRN: [id number]  HPI Here due to a skin tag on her lip This visit occurred during the SARS-CoV-2 public health emergency.  Safety protocols were in place, including screening questions prior to the visit, additional usage of staff PPE, and extensive cleaning of exam room while observing appropriate contact time as indicated for disinfecting solutions.   Has "skin tag" on lower lip Goes back about a month Not really painful  Current Outpatient Medications on File Prior to Visit  Medication Sig Dispense Refill  . celecoxib (CELEBREX) 200 MG capsule TAKE 1 CAPSULE BY MOUTH ONCE DAILY AS NEEDED FOR MILD TO MODERATE PAIN 30 capsule 5  . diclofenac sodium (VOLTAREN) 1 % GEL Apply 4 g topically 4 (four) times daily. (Patient taking differently: Apply 4 g topically 4 (four) times daily as needed. ) 100 g 2  . fluticasone (VERAMYST) 27.5 MCG/SPRAY nasal spray Place 2 sprays into the nose daily as needed for allergies.     . furosemide (LASIX) 20 MG tablet Take 1 tablet (20 mg total) by mouth daily as needed. 30 tablet 3  . glimepiride (AMARYL) 1 MG tablet TAKE 1 TABLET EVERY DAY WITH BREAKFAST 90 tablet 3  . HYDROcodone-homatropine (HYCODAN) 5-1.5 MG/5ML syrup Take 5 mLs by mouth at bedtime as needed for cough. 120 mL 0  . losartan-hydrochlorothiazide (HYZAAR) 100-12.5 MG tablet TAKE 1 TABLET EVERY DAY 90 tablet 3  . metoprolol succinate (TOPROL-XL) 25 MG 24 hr tablet TAKE 1 TABLET (25 MG TOTAL) BY MOUTH DAILY. 90 tablet 3  . Multiple Vitamin (MULTIVITAMIN) tablet Take 1 tablet by mouth daily.    Marland Kitchen nystatin cream (MYCOSTATIN) Apply 1 application topically 2 (two) times daily. Apply to affected area BID for up to 7 days. (Patient taking differently: Apply 1 application topically 2 (two) times daily. Apply to affected area BID PRN) 30 g 1  . pioglitazone (ACTOS) 45 MG tablet TAKE 1 TABLET EVERY DAY 90 tablet 3  .  potassium chloride SA (K-DUR,KLOR-CON) 20 MEQ tablet Take 1 tablet (20 mEq total) by mouth daily as needed. 30 tablet 3  . XARELTO 20 MG TABS tablet TAKE 1 TABLET (20 MG TOTAL) BY MOUTH DAILY WITH SUPPER. 90 tablet 3   No current facility-administered medications on file prior to visit.    Allergies  Allergen Reactions  . Penicillins     Has patient had a PCN reaction causing immediate rash, facial/tongue/throat swelling, SOB or lightheadedness with hypotension: Yes Has patient had a PCN reaction causing severe rash involving mucus membranes or skin necrosis: Yes Has patient had a PCN reaction that required hospitalization No Has patient had a PCN reaction occurring within the last 10 years: No If all of the above answers are "NO", then may proceed with Cephalosporin use.    . Sulfa Antibiotics     Rash/itching  . Azithromycin Palpitations    Past Medical History:  Diagnosis Date  . Balance problems   . Brain tumor ([place])    a. Left intraventricular tumor ->stable by 06/2017 MRI. Followed @ [facility].  . Gait disturbance    a. Unsteady on feet/balance difficulty.  . Generalized osteoarthritis of multiple sites   . GERD (gastroesophageal reflux disease)   . History of DVT (deep vein thrombosis) 2016   estrogen and airflights. Rx with xarelto for 3 months  . History of stress test    a. 11/2016 ETT: No  ST/T changes.  Performed to assess PAC/PVC burden with activity ->No ectopy noted.  . Hypertension   . Hyperthyroidism    treated briefly in college  . Insomnia   . Obesity   . PAF (paroxysmal atrial fibrillation) ([place])    a. 12/2016 Event Monitor: brief run of PAF-->CHA2DS2VASc = 5--> Xarelto.  . Plantar fasciitis   . Retinal tear of left eye   . Rosacea   . Type 2 diabetes, controlled, with peripheral neuropathy ([place])    a. 11/2016 A1c 7.4.  . Uterine fibroid     Past Surgical History:  Procedure Laterality Date  . CARPAL TUNNEL RELEASE Right    Dr Burney Gauze  . CATARACT  EXTRACTION    . RETINAL TEAR REPAIR CRYOTHERAPY     10/09/13, then again 3/15    Family History  Problem Relation Age of Onset  . Diabetes Father   . Pancreatic cancer Father   . Diabetes Other        grandmother    Social History   Socioeconomic History  . Marital status: Married    Spouse name: Not on file  . Number of children: 3  . Years of education: Not on file  . Highest education level: Not on file  Occupational History  . Occupation: Non Patent attorney    Comment: [employer] and others  Tobacco Use  . Smoking status: Former Smoker    Quit date: 09/26/1993    Years since quitting: 26.4  . Smokeless tobacco: Never Used  . Tobacco comment: quit in 1996  Vaping Use  . Vaping Use: Never used  Substance and Sexual Activity  . Alcohol use: Yes    Alcohol/week: 0.0 - 1.0 standard drinks  . Drug use: No  . Sexual activity: Not Currently    Partners: Male  Other Topics Concern  . Not on file  Social History Narrative   Has living will   Husband is health care POA-- or daughter Izora Gala   Would accept resuscitation attempts   Would not want tube feeds if cognitively unaware   Social Determinants of Health   Financial Resource Strain: Low Risk   . Difficulty of Paying Living Expenses: Not very hard  Food Insecurity:   . Worried About Charity fundraiser in the Last Year:   . Arboriculturist in the Last Year:   Transportation Needs:   . Film/video editor (Medical):   Marland Kitchen Lack of Transportation (Non-Medical):   Physical Activity:   . Days of Exercise per Week:   . Minutes of Exercise per Session:   Stress:   . Feeling of Stress :   Social Connections:   . Frequency of Communication with Friends and Family:   . Frequency of Social Gatherings with Friends and Family:   . Attends Religious Services:   . Active Member of Clubs or Organizations:   . Attends Archivist Meetings:   Marland Kitchen Marital Status:   Intimate Partner Violence:   . Fear of Current or  Ex-Partner:   . Emotionally Abused:   Marland Kitchen Physically Abused:   . Sexually Abused:    Review of Systems     Objective:   Physical Exam  Skin:  Small skin tag adjacent to mid lower lip No induration           Assessment & Plan:

## 2020-03-19 ENCOUNTER — Ambulatory Visit: Payer: Medicare Other | Admitting: Physical Therapy

## 2020-03-24 ENCOUNTER — Ambulatory Visit: Payer: Medicare Other | Admitting: Physical Therapy

## 2020-03-26 ENCOUNTER — Ambulatory Visit: Payer: Medicare Other | Attending: Orthopedic Surgery | Admitting: Physical Therapy

## 2020-03-26 ENCOUNTER — Encounter: Payer: Self-pay | Admitting: Physical Therapy

## 2020-03-26 ENCOUNTER — Other Ambulatory Visit: Payer: Self-pay

## 2020-03-26 DIAGNOSIS — R2681 Unsteadiness on feet: Secondary | ICD-10-CM | POA: Diagnosis not present

## 2020-03-26 DIAGNOSIS — M6281 Muscle weakness (generalized): Secondary | ICD-10-CM | POA: Insufficient documentation

## 2020-03-26 DIAGNOSIS — M25561 Pain in right knee: Secondary | ICD-10-CM

## 2020-03-26 DIAGNOSIS — M79672 Pain in left foot: Secondary | ICD-10-CM | POA: Diagnosis not present

## 2020-03-26 DIAGNOSIS — M79671 Pain in right foot: Secondary | ICD-10-CM | POA: Insufficient documentation

## 2020-03-26 DIAGNOSIS — R262 Difficulty in walking, not elsewhere classified: Secondary | ICD-10-CM

## 2020-03-26 NOTE — Therapy (Signed)
[place] MAIN [facility] SERVICES [address] [place], [place], [address] Phone: [phone]   Fax:  [phone]  Physical Therapy Treatment  Patient Details  Name: Angela Bentley MRN: [id number] Date of Birth: 07-21-1941 Referring Provider (PT): [name], MD   Encounter Date: 03/26/2020   PT End of Session - 03/26/20 1151    Visit Number 16    Number of Visits 25    Date for PT Re-Evaluation 04/08/20    PT Start Time 1146    PT Stop Time 1230    PT Time Calculation (min) 44 min    Equipment Utilized During Treatment Gait belt    Activity Tolerance Patient tolerated treatment well;No increased pain    Behavior During Therapy WFL for tasks assessed/performed           Past Medical History:  Diagnosis Date   Balance problems    Brain tumor ([place])    a. Left intraventricular tumor ->stable by 06/2017 MRI. Followed @ [facility].   Gait disturbance    a. Unsteady on feet/balance difficulty.   Generalized osteoarthritis of multiple sites    GERD (gastroesophageal reflux disease)    History of DVT (deep vein thrombosis) 2016   estrogen and airflights. Rx with xarelto for 3 months   History of stress test    a. 11/2016 ETT: No ST/T changes.  Performed to assess PAC/PVC burden with activity ->No ectopy noted.   Hypertension    Hyperthyroidism    treated briefly in college   Insomnia    Obesity    PAF (paroxysmal atrial fibrillation) ([place])    a. 12/2016 Event Monitor: brief run of PAF-->CHA2DS2VASc = 5--> Xarelto.   Plantar fasciitis    Retinal tear of left eye    Rosacea    Type 2 diabetes, controlled, with peripheral neuropathy ([place])    a. 11/2016 A1c 7.4.   Uterine fibroid     Past Surgical History:  Procedure Laterality Date   CARPAL TUNNEL RELEASE Right    Dr Burney Gauze   CATARACT EXTRACTION     RETINAL TEAR REPAIR CRYOTHERAPY     10/09/13, then again 3/15    There were no vitals filed for this visit.   Subjective  Assessment - 03/26/20 1155    Subjective Patient reports increased fatigue as she hasn't been sleeping well. As a result she is feeling unsteady. Reports her knee is still doing well; Her daughter in law kept telling her this last week that she should get up and move more.    Pertinent History 79 yo Female reports imbalance several years. She has a brain tumor on right parietal lobe which her MD states has nothing to do with imbalance; She does reports having increased headaches in the area over last 3 mornings but feels like coffee will help resolve symptoms; She has had hearing checked recently which the audiologist said that her hearing is fine but right ear is slightly better than left ear; She does have tinnitus in left ear;  She has also had an eye exam recently with no significant change in vision; 3 years ago this started after [place]. She saw Dr. Pryor Ochoa who couldn't find anything significant but was concerned she could have had a viral infection that could have killed off some nerve cells in left ear. She has had a few falls in last year. She denies any numbness/tingling in BLE but does have a PMH significant of diabetes;    How  long can you sit comfortably? variable; 10 min or 1 hour    How long can you stand comfortably? Short time    How long can you walk comfortably? 0.25 miles    Diagnostic tests MRI in March 2020 shows Horizontal tear anterior body of the medial meniscus    Patient Stated Goals "Improving standing position, reduce knee valgus position, reduce pain especially with improving sleep, increase endurance    Currently in Pain? No/denies    Pain Onset Yesterday    Pain Onset 1 to 4 weeks ago                 TREATMENT: Gait on treadmill 1.5-2.0 mph with 2 HHA x5 min; patient does exhibit slight increased LLE foot drag with increased knee flexion at initial contact requiring cues for increased step length and increased DF at heel strike; Patient  does become short of breath, vitals monitored: SPO2  96% HR: 140  Instructed patient in balance exercise: Standing on foam pad: Feet apart: eyes closed heel raises 3 sec hold x10 reps with CGA for safety, Feet together: Eyes open/closed 10 sec hold x2 reps;  Standing one foot on airex, one foot on 4 inch step with dyna disc: Eyes open head turn side/side x5 reps each; Eyes closed head turns side/side x5 reps each  Standing on firm surface mid stance forward/backward weight shift with heel lift, toe raise x10 reps  Progressed to standing on dyna disc mid stance forward/backward weight shift with heel lift/toe raise x2 min with 2-1-0 rail assist with min a for safety; patient initially exhibits increased knee flexion with forward weight shift requiring cues to improve knee extension for better forward weight shift;  Patient required min VCs for balance stability, including to increase trunk control for less loss of balance withstaggered stance;    Patient toleratedsessionwell.She did have increased difficulty with lateral head turns especially with eyes closed. Patient also had increased difficulty with staggered stance with airex/step requiring min A for safety; Advanced HEP with forward/backward weight shift to improve weight shift with gait tasks.                     PT Education - 03/26/20 1150    Education Details balance/strength, HEP    Person(s) Educated Patient    Methods Explanation;Verbal cues    Comprehension Verbalized understanding;Returned demonstration;Verbal cues required;Need further instruction            PT Short Term Goals - 03/11/20 0818      PT SHORT TERM GOAL #1   Title Patient will be adherent to HEP at least 3x a week to improve functional strength and balance for better safety at home.    Baseline 5/25: not done much this last week;    Time 4    Period Weeks     Status Not Met    Target Date 03/17/20      PT SHORT TERM GOAL #2   Title Patient will increase RLE knee AROM extension to within 5 degrees of neutral for better standing and walking tolerance;     Time 4    Period Weeks    Status Partially Met    Target Date 03/17/20      PT SHORT TERM GOAL #3   Title Patient will reports maximum of 1 sleep disturbances per night over last 3 days to exhibit improved sleeping tolerance with less knee pain and reduced fatigue.     Baseline 5/25:  once or less, not very often;    Time 4    Period Weeks    Status Achieved    Target Date 01/22/20             PT Long Term Goals - 03/11/20 0819      PT LONG TERM GOAL #1   Title Patient will increase BLE gross strength to 4+/5 as to improve functional strength for independent gait, increased standing tolerance and increased ADL ability.    Baseline 5/25: see flowsheet    Time 4    Period Weeks    Status Partially Met    Target Date 04/08/20      PT LONG TERM GOAL #2   Title Patient will improve FOTO score to >55/100 to indicate improved functional mobility with ADLs.    Baseline 4/7: 51/100, 5/25: 50/100    Time 4    Period Weeks    Status Not Met    Target Date 04/08/20      PT LONG TERM GOAL #3   Title Patient will report a worst pain of 3/10 on VAS in      right knee       to improve tolerance with ADLs and reduced symptoms with activities.     Baseline 5/25: 3-4/10 in right knee;    Time 4    Period Weeks    Status Partially Met    Target Date 04/08/20      PT LONG TERM GOAL #4   Title Patient will complete >1000 feet on 6 min walk test without increase in knee pain to improve community ambulator distance for functional tasks and return to PLOF.    Baseline 5/25: 1150 feet    Time 4    Period Weeks    Status Achieved    Target Date 04/08/20      PT LONG TERM GOAL #5   Title Patient (> 36 years old) will complete five times sit to stand test in < 15 seconds indicating an increased  LE strength and improved balance.    Baseline 5/25: 15.15 sec    Time 4    Period Weeks    Status Partially Met    Target Date 04/08/20      PT LONG TERM GOAL #6   Title Patient will increase Functional Gait Assessment score to >20/30 as to reduce fall risk and improve dynamic gait safety with community ambulation.    Time 4    Period Weeks    Status New    Target Date 04/08/20                 Plan - 03/26/20 1250    Clinical Impression Statement Patient motivated and participated well within session. She was instructed in advanced balance exercise. Patient does have difficulty with single limb stance especially with eyes closed. Patient instructed in mid stance weight shift to challenge forward/backward weight shift. She does require cues for proper positioning and technique but with increased repetition was able to exhibit improved mobility. Patient would benefit from additional skilled PT intervention to improve strength, balance and mobility;    Personal Factors and Comorbidities Age;Comorbidity 3+;Past/Current Experience    Comorbidities PMH: significant for DMx2 (controlled), HTN (controlled), chronic dizziness/impaired vision in left eye, imbalance, A-fib (on blood thinner),    Examination-Activity Limitations Bed Mobility;Caring for Others;Lift;Locomotion Level;Sit;Sleep;Squat;Stairs;Stand;Transfers    Examination-Participation Restrictions Church;Cleaning;Community Activity;Shop;Volunteer;Yard Work    [employer]chant [employer] officer Evolving/Moderate complexity    Rehab Potential Good  PT Frequency 2x / week    PT Duration 4 weeks    PT Treatment/Interventions ADLs/Self Care Home Management;Cryotherapy;Electrical Stimulation;Moist Heat;Gait training;Stair training;Functional mobility training;Therapeutic activities;Therapeutic exercise;Balance training;Neuromuscular re-education;Patient/family education;Manual techniques;Passive range of motion;Dry needling;Energy  conservation;Taping    PT Next Visit Plan work on balance    PT Home Exercise Plan advanced with balance;    Consulted and Agree with Plan of Care Patient           Patient will benefit from skilled therapeutic intervention in order to improve the following deficits and impairments:  Abnormal gait, Decreased balance, Decreased endurance, Decreased mobility, Difficulty walking, Improper body mechanics, Decreased range of motion, Decreased activity tolerance, Decreased strength, Pain, Dizziness  Visit Diagnosis: Acute pain of right knee  Muscle weakness (generalized)  Unsteadiness on feet  Difficulty in walking, not elsewhere classified     Problem List Patient Active Problem List   Diagnosis Date Noted   Skin tag 03/18/2020   Trapezius strain, left, initial encounter 02/21/2020   Left-sided chest pain 10/23/2019   Overactive bladder 10/09/2019   Lumbar sprain 03/28/2019   Tinnitus, bilateral 11/28/2017   Abnormal MRI of head 11/14/2017   Meningioma ([place]) 09/21/2017   PAF (paroxysmal atrial fibrillation) ([place]) 02/03/2017   Preventative health care 06/02/2016   Advance directive discussed with patient 06/02/2016   Achilles tendonitis 03/11/2016   Hypertension    DVT, lower extremity, recurrent ([place])    Type 2 diabetes, controlled, with peripheral neuropathy ([facility])    Generalized osteoarthritis of multiple sites    GERD (gastroesophageal reflux disease)    Hyperthyroidism    Retinal hole of right eye 06/26/2014   Amblyopia, right 05/08/2014   Status post intraocular lens implant 05/08/2014    Blakeleigh Domek PT, DPT 03/26/2020, 12:53 PM  [place] MAIN [facility] SERVICES [address] [place], [place], [address] Phone: [phone]   Fax:  [phone]  Name: Angela Bentley MRN: [id number] Date of Birth: [birth year]

## 2020-04-02 ENCOUNTER — Encounter: Payer: Self-pay | Admitting: Physical Therapy

## 2020-04-02 ENCOUNTER — Other Ambulatory Visit: Payer: Self-pay

## 2020-04-02 ENCOUNTER — Ambulatory Visit: Payer: Medicare Other | Admitting: Physical Therapy

## 2020-04-02 DIAGNOSIS — M25561 Pain in right knee: Secondary | ICD-10-CM | POA: Diagnosis not present

## 2020-04-02 DIAGNOSIS — M79671 Pain in right foot: Secondary | ICD-10-CM | POA: Diagnosis not present

## 2020-04-02 DIAGNOSIS — R2681 Unsteadiness on feet: Secondary | ICD-10-CM

## 2020-04-02 DIAGNOSIS — M6281 Muscle weakness (generalized): Secondary | ICD-10-CM | POA: Diagnosis not present

## 2020-04-02 DIAGNOSIS — R262 Difficulty in walking, not elsewhere classified: Secondary | ICD-10-CM | POA: Diagnosis not present

## 2020-04-02 DIAGNOSIS — M79672 Pain in left foot: Secondary | ICD-10-CM | POA: Diagnosis not present

## 2020-04-02 NOTE — Patient Instructions (Signed)
Access Code: [email or web address] URL: [email or web address] Date: 04/02/2020 Prepared by: Blanche East  Exercises Walking with Head Rotation - 1 x daily - 5 x weekly - 2 sets - 5 reps

## 2020-04-02 NOTE — Therapy (Signed)
[place] MAIN [facility] SERVICES [address] [place], [place], [address] Phone: [phone]   Fax:  [phone]  Physical Therapy Treatment  Patient Details  Name: Angela Bentley MRN: [id number] Date of Birth: 1940/11/26 Referring Provider (PT): [name], MD   Encounter Date: 04/02/2020   PT End of Session - 04/02/20 1029    Visit Number 17    Number of Visits 25    Date for PT Re-Evaluation 04/08/20    PT Start Time 1018    PT Stop Time 1100    PT Time Calculation (min) 42 min    Equipment Utilized During Treatment Gait belt    Activity Tolerance Patient tolerated treatment well;No increased pain    Behavior During Therapy WFL for tasks assessed/performed           Past Medical History:  Diagnosis Date  . Balance problems   . Brain tumor ([place])    a. Left intraventricular tumor ->stable by 06/2017 MRI. Followed @ [facility].  . Gait disturbance    a. Unsteady on feet/balance difficulty.  . Generalized osteoarthritis of multiple sites   . GERD (gastroesophageal reflux disease)   . History of DVT (deep vein thrombosis) 2016   estrogen and airflights. Rx with xarelto for 3 months  . History of stress test    a. 11/2016 ETT: No ST/T changes.  Performed to assess PAC/PVC burden with activity ->No ectopy noted.  . Hypertension   . Hyperthyroidism    treated briefly in college  . Insomnia   . Obesity   . PAF (paroxysmal atrial fibrillation) ([place])    a. 12/2016 Event Monitor: brief run of PAF-->CHA2DS2VASc = 5--> Xarelto.  . Plantar fasciitis   . Retinal tear of left eye   . Rosacea   . Type 2 diabetes, controlled, with peripheral neuropathy ([place])    a. 11/2016 A1c 7.4.  . Uterine fibroid     Past Surgical History:  Procedure Laterality Date  . CARPAL TUNNEL RELEASE Right    Dr Burney Gauze  . CATARACT EXTRACTION    . RETINAL TEAR REPAIR CRYOTHERAPY     10/09/13, then again 3/15    There were no vitals filed for this visit.   Subjective  Assessment - 04/02/20 1028    Subjective Patient reports increased unsteadiness after not sleeping well last night. The dog had her up during the night. She also reports slight increase in swelling and discomfort in knee (R>L). Patient reports less foot pain after putting inserts back into shoes;    Pertinent History 79 yo Female reports imbalance several years. She has a brain tumor on right parietal lobe which her MD states has nothing to do with imbalance; She does reports having increased headaches in the area over last 3 mornings but feels like coffee will help resolve symptoms; She has had hearing checked recently which the audiologist said that her hearing is fine but right ear is slightly better than left ear; She does have tinnitus in left ear;  She has also had an eye exam recently with no significant change in vision; 3 years ago this started after [place]. She saw Dr. Pryor Ochoa who couldn't find anything significant but was concerned she could have had a viral infection that could have killed off some nerve cells in left ear. She has had a few falls in last year. She denies any numbness/tingling in BLE but does have a PMH significant of diabetes;    How long  can you sit comfortably? variable; 10 min or 1 hour    How long can you stand comfortably? Short time    How long can you walk comfortably? 0.25 miles    Diagnostic tests MRI in March 2020 shows Horizontal tear anterior body of the medial meniscus    Patient Stated Goals "Improving standing position, reduce knee valgus position, reduce pain especially with improving sleep, increase endurance    Currently in Pain? Yes    Pain Score 3     Pain Location Knee    Pain Orientation Right    Pain Descriptors / Indicators Aching;Sore    Pain Type Acute pain    Pain Onset Today    Pain Frequency Intermittent    Aggravating Factors  tender to touch    Pain Relieving Factors unsure    Effect of Pain on Daily Activities no  change;    Multiple Pain Sites No    Pain Onset 1 to 4 weeks ago              TREATMENT: Gait on treadmill 1.5-1.8 mph with 2 HHA x5 min; patient does exhibit slight increased LLE foot drag with increased knee flexion at initial contact requiring cues for increased step length and increased DF at heel strike;   Seated LAQ 3# x10 each LE, additional 6 reps on RLE;  Patient does report increased difficulty with RLE LAQ and reports mild discomfort with exercise.   Instructed patient in balance exercise: Standing on foam pad: Feet apart: eyes open/closed heel raises 3 sec hold x10 reps with CGA for safety,  Standing one foot on airex, one foot on 4 inch step with dyna disc: Eyes open unsupported 15 sec hold x2 reps; Eyes closed unsupported 15 sec hold x2 reps; Patient required min A for safety with increased loss of balance to left side;   Standing on firm surface mid stance forward/backward weight shift with heel lift, toe raise x10 reps with good positioning noted;   Gait on level surface with lateral head turns side/side x80 feet x2 laps with supervision with mild lateral sway but patient able to self correct;   Patient required min VCs for balance stability, including to increase trunk control for less loss of balance withstaggered stance;   Patient toleratedsessionwell.She did have increased difficulty with unsupported standing especially with modified SLS often losing balance to left side. She was able to exhibit better ankle DF this session with less foot drag;   Advanced HEP with forward walking with head turns to challenge dynamic balance safety;                         PT Education - 04/02/20 1029    Education Details balance/strength, HEP    Person(s) Educated Patient    Methods Explanation;Verbal cues    Comprehension Verbalized understanding;Returned demonstration;Verbal cues required;Need  further instruction            PT Short Term Goals - 03/11/20 0818      PT SHORT TERM GOAL #1   Title Patient will be adherent to HEP at least 3x a week to improve functional strength and balance for better safety at home.    Baseline 5/25: not done much this last week;    Time 4    Period Weeks    Status Not Met    Target Date 03/17/20      PT SHORT TERM GOAL #2   Title Patient will  increase RLE knee AROM extension to within 5 degrees of neutral for better standing and walking tolerance;     Time 4    Period Weeks    Status Partially Met    Target Date 03/17/20      PT SHORT TERM GOAL #3   Title Patient will reports maximum of 1 sleep disturbances per night over last 3 days to exhibit improved sleeping tolerance with less knee pain and reduced fatigue.     Baseline 5/25: once or less, not very often;    Time 4    Period Weeks    Status Achieved    Target Date 01/22/20             PT Long Term Goals - 03/11/20 0819      PT LONG TERM GOAL #1   Title Patient will increase BLE gross strength to 4+/5 as to improve functional strength for independent gait, increased standing tolerance and increased ADL ability.    Baseline 5/25: see flowsheet    Time 4    Period Weeks    Status Partially Met    Target Date 04/08/20      PT LONG TERM GOAL #2   Title Patient will improve FOTO score to >55/100 to indicate improved functional mobility with ADLs.    Baseline 4/7: 51/100, 5/25: 50/100    Time 4    Period Weeks    Status Not Met    Target Date 04/08/20      PT LONG TERM GOAL #3   Title Patient will report a worst pain of 3/10 on VAS in      right knee       to improve tolerance with ADLs and reduced symptoms with activities.     Baseline 5/25: 3-4/10 in right knee;    Time 4    Period Weeks    Status Partially Met    Target Date 04/08/20      PT LONG TERM GOAL #4   Title Patient will complete >1000 feet on 6 min walk test without increase in knee pain to improve  community ambulator distance for functional tasks and return to PLOF.    Baseline 5/25: 1150 feet    Time 4    Period Weeks    Status Achieved    Target Date 04/08/20      PT LONG TERM GOAL #5   Title Patient (> 65 years old) will complete five times sit to stand test in < 15 seconds indicating an increased LE strength and improved balance.    Baseline 5/25: 15.15 sec    Time 4    Period Weeks    Status Partially Met    Target Date 04/08/20      PT LONG TERM GOAL #6   Title Patient will increase Functional Gait Assessment score to >20/30 as to reduce fall risk and improve dynamic gait safety with community ambulation.    Time 4    Period Weeks    Status New    Target Date 04/08/20                 Plan - 04/02/20 1149    Clinical Impression Statement Patient motivated and participated well within session. Instructed patient in LE strengthening. She did have difficulty with weighted LAQ right worse than left requiring cues to slow down LE movement for increased motor control. Patient does report increased fatigue with short duration. Patient instructed in advanced balance tasks. She continues  to have loss of balance to left side requiring min A when unsupported especially with compliant surfaces. Patient does exhibit improved foot clearance this session with less foot drag. Instructed patient in gait with lateral head turns. Advanced HEP, see patient instructions. Patient would benefit from additional skilled PT Intervention to improve strength, balance and mobility;    Personal Factors and Comorbidities Age;Comorbidity 3+;Past/Current Experience    Comorbidities PMH: significant for DMx2 (controlled), HTN (controlled), chronic dizziness/impaired vision in left eye, imbalance, A-fib (on blood thinner),    Examination-Activity Limitations Bed Mobility;Caring for Others;Lift;Locomotion Level;Sit;Sleep;Squat;Stairs;Stand;Transfers    Examination-Participation Restrictions  Church;Cleaning;Community Activity;Shop;Volunteer;Yard Work    [employer] Evolving/Moderate complexity    Rehab Potential Good    PT Frequency 2x / week    PT Duration 4 weeks    PT Treatment/Interventions ADLs/Self Care Home Management;Cryotherapy;Electrical Stimulation;Moist Heat;Gait training;Stair training;Functional mobility training;Therapeutic activities;Therapeutic exercise;Balance training;Neuromuscular re-education;Patient/family education;Manual techniques;Passive range of motion;Dry needling;Energy conservation;Taping    PT Next Visit Plan work on balance    PT Home Exercise Plan advanced with balance;    Consulted and Agree with Plan of Care Patient           Patient will benefit from skilled therapeutic intervention in order to improve the following deficits and impairments:  Abnormal gait, Decreased balance, Decreased endurance, Decreased mobility, Difficulty walking, Improper body mechanics, Decreased range of motion, Decreased activity tolerance, Decreased strength, Pain, Dizziness  Visit Diagnosis: Acute pain of right knee  Muscle weakness (generalized)  Unsteadiness on feet  Difficulty in walking, not elsewhere classified     Problem List Patient Active Problem List   Diagnosis Date Noted  . Skin tag 03/18/2020  . Trapezius strain, left, initial encounter 02/21/2020  . Left-sided chest pain 10/23/2019  . Overactive bladder 10/09/2019  . Lumbar sprain 03/28/2019  . Tinnitus, bilateral 11/28/2017  . Abnormal MRI of head 11/14/2017  . Meningioma ([place]) 09/21/2017  . PAF (paroxysmal atrial fibrillation) ([place]) 02/03/2017  . Preventative health care 06/02/2016  . Advance directive discussed with patient 06/02/2016  . Achilles tendonitis 03/11/2016  . Hypertension   . DVT, lower extremity, recurrent ([place])   . Type 2 diabetes, controlled, with peripheral neuropathy ([place])   . Generalized osteoarthritis of multiple sites   . GERD  (gastroesophageal reflux disease)   . Hyperthyroidism   . Retinal hole of right eye 06/26/2014  . Amblyopia, right 05/08/2014  . Status post intraocular lens implant 05/08/2014    Hommer Cunliffe PT, DPT 04/02/2020, 1:09 PM  [place] MAIN [facility] SERVICES [address] [place], [place], [address] Phone: [phone]   Fax:  [phone]  Name: Sherel Fennell MRN: [id number] Date of Birth: August 20, 1941

## 2020-04-04 NOTE — Progress Notes (Signed)
Date:  04/06/2020   ID:  [name], DOB 05/08/1941, MRN [id number]  Patient Location:  [address] [place] [address]   Provider location:   [facility], [place] office  PCP:  [name], MD  Cardiologist:  Arvid Right [facility]  Chief Complaint  Patient presents with  . Other    12  month follow up. PAtient c/o swelling in ankles.  Meds reviewed verbally with patient.      History of Present Illness:    Angela Bentley is a 79 y.o. female  past medical history of paroxysmal atrial fibrillation,  hypertension,  diabetes,  GERD,  persistent gait imbalance,  left intraventricular brain tumor,  prior history of DVT.  Who presents for f/u of her PAF, HTN  In general she reports that she has been doing well Sedentary, lives at [facility], walks the dog but no other regular exercise program Weight has been stable over the past year  BP elevated in the office Rushing in BP with Dr. [name] well controlled over the past 6 months typically 427 systolics  Knee pain better At cartilage injection, physical therapy  No chest pain or SOB  Balance issue, working with PT  Discussed "brain tumor" finding, size of a golf ball MRI annually, followed by neurosurg Reports it has not changed size recently  Labs reviewed from Jan 2021 Total chol 173, LDL 91 HBA1C 7.6  Lasix prn, gets ankle swelling Does not want to wear compression hose especially in the summer  Discussed her chronic dizziness symptoms  EKG personally reviewed by myself on todays visit NSR rate 96 bpm,. Nonspecific AST ABN  Other past medical history reviewed diagnosed with paroxysmal atrial fibrillation after reporting palpitations  Documented wall wearing an event monitor which showed a brief run of atrial fibrillation.  Xarelto  CHA2DS2VASc of 5.    Prior CV studies:   The following studies were reviewed today:   Past Medical History:  Diagnosis Date  .  Balance problems   . Brain tumor ([place])    a. Left intraventricular tumor ->stable by 06/2017 MRI. Followed @ [facility].  . Gait disturbance    a. Unsteady on feet/balance difficulty.  . Generalized osteoarthritis of multiple sites   . GERD (gastroesophageal reflux disease)   . History of DVT (deep vein thrombosis) 2016   estrogen and airflights. Rx with xarelto for 3 months  . History of stress test    a. 11/2016 ETT: No ST/T changes.  Performed to assess PAC/PVC burden with activity ->No ectopy noted.  . Hypertension   . Hyperthyroidism    treated briefly in college  . Insomnia   . Obesity   . PAF (paroxysmal atrial fibrillation) ([place])    a. 12/2016 Event Monitor: brief run of PAF-->CHA2DS2VASc = 5--> Xarelto.  . Plantar fasciitis   . Retinal tear of left eye   . Rosacea   . Type 2 diabetes, controlled, with peripheral neuropathy ([place])    a. 11/2016 A1c 7.4.  . Uterine fibroid    Past Surgical History:  Procedure Laterality Date  . CARPAL TUNNEL RELEASE Right    Dr Burney Gauze  . CATARACT EXTRACTION    . RETINAL TEAR REPAIR CRYOTHERAPY     10/09/13, then again 3/15     Current Meds  Medication Sig  . diclofenac sodium (VOLTAREN) 1 % GEL Apply 4 g topically 4 (four) times daily. (Patient taking differently: Apply 4 g topically 4 (four) times  daily as needed. )  . fluticasone (VERAMYST) 27.5 MCG/SPRAY nasal spray Place 2 sprays into the nose daily as needed for allergies.   . furosemide (LASIX) 20 MG tablet Take 1 tablet (20 mg total) by mouth daily as needed.  Marland Kitchen glimepiride (AMARYL) 1 MG tablet TAKE 1 TABLET EVERY DAY WITH BREAKFAST  . HYDROcodone-homatropine (HYCODAN) 5-1.5 MG/5ML syrup Take 5 mLs by mouth at bedtime as needed for cough.  . losartan-hydrochlorothiazide (HYZAAR) 100-12.5 MG tablet TAKE 1 TABLET EVERY DAY  . metoprolol succinate (TOPROL-XL) 25 MG 24 hr tablet TAKE 1 TABLET (25 MG TOTAL) BY MOUTH DAILY.  . Multiple Vitamin (MULTIVITAMIN) tablet Take 1 tablet by mouth  daily.  Marland Kitchen nystatin cream (MYCOSTATIN) Apply 1 application topically 2 (two) times daily. Apply to affected area BID for up to 7 days. (Patient taking differently: Apply 1 application topically 2 (two) times daily. Apply to affected area BID PRN)  . pioglitazone (ACTOS) 45 MG tablet TAKE 1 TABLET EVERY DAY  . potassium chloride SA (K-DUR,KLOR-CON) 20 MEQ tablet Take 1 tablet (20 mEq total) by mouth daily as needed.  Alveda Reasons 20 MG TABS tablet TAKE 1 TABLET (20 MG TOTAL) BY MOUTH DAILY WITH SUPPER.     Allergies:   Penicillins, Sulfa antibiotics, and Azithromycin   Social History   Tobacco Use  . Smoking status: Former Smoker    Quit date: 09/26/1993    Years since quitting: 26.5  . Smokeless tobacco: Never Used  . Tobacco comment: quit in 1996  Vaping Use  . Vaping Use: Never used  Substance Use Topics  . Alcohol use: Yes    Alcohol/week: 0.0 - 1.0 standard drinks  . Drug use: No     Current Outpatient Medications on File Prior to Visit  Medication Sig Dispense Refill  . diclofenac sodium (VOLTAREN) 1 % GEL Apply 4 g topically 4 (four) times daily. (Patient taking differently: Apply 4 g topically 4 (four) times daily as needed. ) 100 g 2  . fluticasone (VERAMYST) 27.5 MCG/SPRAY nasal spray Place 2 sprays into the nose daily as needed for allergies.     . furosemide (LASIX) 20 MG tablet Take 1 tablet (20 mg total) by mouth daily as needed. 30 tablet 3  . glimepiride (AMARYL) 1 MG tablet TAKE 1 TABLET EVERY DAY WITH BREAKFAST 90 tablet 3  . HYDROcodone-homatropine (HYCODAN) 5-1.5 MG/5ML syrup Take 5 mLs by mouth at bedtime as needed for cough. 120 mL 0  . losartan-hydrochlorothiazide (HYZAAR) 100-12.5 MG tablet TAKE 1 TABLET EVERY DAY 90 tablet 3  . metoprolol succinate (TOPROL-XL) 25 MG 24 hr tablet TAKE 1 TABLET (25 MG TOTAL) BY MOUTH DAILY. 90 tablet 3  . Multiple Vitamin (MULTIVITAMIN) tablet Take 1 tablet by mouth daily.    Marland Kitchen nystatin cream (MYCOSTATIN) Apply 1 application  topically 2 (two) times daily. Apply to affected area BID for up to 7 days. (Patient taking differently: Apply 1 application topically 2 (two) times daily. Apply to affected area BID PRN) 30 g 1  . pioglitazone (ACTOS) 45 MG tablet TAKE 1 TABLET EVERY DAY 90 tablet 3  . potassium chloride SA (K-DUR,KLOR-CON) 20 MEQ tablet Take 1 tablet (20 mEq total) by mouth daily as needed. 30 tablet 3  . XARELTO 20 MG TABS tablet TAKE 1 TABLET (20 MG TOTAL) BY MOUTH DAILY WITH SUPPER. 90 tablet 3   No current facility-administered medications on file prior to visit.     Family Hx: The patient's family history includes Diabetes  in her father and another family member; Pancreatic cancer in her father.  ROS:   Please see the history of present illness.    Review of Systems  Constitutional: Negative.   HENT: Negative.   Respiratory: Negative.   Cardiovascular: Negative.   Gastrointestinal: Negative.   Musculoskeletal: Negative.   Neurological: Negative.   Psychiatric/Behavioral: Negative.   All other systems reviewed and are negative.    Labs/Other Tests and Data Reviewed:    Recent Labs: 10/09/2019: ALT 20; BUN 16; Creatinine, Ser 0.79; Hemoglobin 12.5; Platelets 260.0; Potassium 4.0; Sodium 138   Recent Lipid Panel Lab Results  Component Value Date/Time   CHOL 173 10/09/2019 09:10 AM   TRIG 170.0 (H) 10/09/2019 09:10 AM   HDL 48.10 10/09/2019 09:10 AM   CHOLHDL 4 10/09/2019 09:10 AM   LDLCALC 91 10/09/2019 09:10 AM    Wt Readings from Last 3 Encounters:  04/06/20 201 lb (91.2 kg)  03/18/20 204 lb (92.5 kg)  02/21/20 205 lb (93 kg)     Exam:    Vital Signs: Vital signs may also be detailed in the HPI BP (!) 150/76 (BP Location: Left Arm, Patient Position: Sitting, Cuff Size: Normal)   Pulse 96   Ht 5' 6.5" (1.689 m)   Wt 201 lb (91.2 kg)   LMP 09/26/1997   BMI 31.96 kg/m   Constitutional:  oriented to person, place, and time. No distress.  HENT:  Head: Normocephalic and  atraumatic.  Eyes:  no discharge. No scleral icterus.  Neck: Normal range of motion. Neck supple. No JVD present.  Cardiovascular: Normal rate, regular rhythm, normal heart sounds and intact distal pulses. Exam reveals no gallop and no friction rub.  Trace ankle edema  no murmur heard. Pulmonary/Chest: Effort normal and breath sounds normal. No stridor. No respiratory distress.  no wheezes.  no rales.  no tenderness.  Abdominal: Soft.  no distension.  no tenderness.  Musculoskeletal: Normal range of motion.  no  tenderness or deformity.  Neurological:  normal muscle tone. Coordination normal. No atrophy Skin: Skin is warm and dry. No rash noted. not diaphoretic.  Psychiatric:  normal mood and affect. behavior is normal. Thought content normal.    ASSESSMENT & PLAN:    PAF (paroxysmal atrial fibrillation) (HCC) - Tolerating anticoagulation, Xarelto No significant bruising, no recent falls Does not feel that she has had breakthrough arrhythmia No change in the medications at this time  Type 2 diabetes, controlled, with peripheral neuropathy ([place]) - Recommend regular exercise program Has a good gym at [facility] Would like to lose weight A1c dat the high end of the range we would like  Essential hypertension - Blood pressure elevated in the office, well controlled on visits to primary care Recommend she monitor at home Would call our office for systolic pressures in the 140 range  Gastroesophageal reflux disease without esophagitis - stable sx  Ankle swelling Likely secondary to dependent edema/venous insufficiency Could wear ankle high compression sock Lasix may not be of much benefit   Total encounter time more than 25 minutes  Greater than 50% was spent in counseling and coordination of care with the patient    Disposition: Follow-up in 12 months   Signed, Ida Rogue, MD  04/06/2020 4:54 PM    [place] Office [address] [place] [address], [place], [place] [address]

## 2020-04-06 ENCOUNTER — Encounter: Payer: Self-pay | Admitting: Cardiovascular Disease

## 2020-04-06 ENCOUNTER — Ambulatory Visit: Payer: Medicare Other

## 2020-04-06 ENCOUNTER — Other Ambulatory Visit: Payer: Self-pay

## 2020-04-06 ENCOUNTER — Ambulatory Visit (INDEPENDENT_AMBULATORY_CARE_PROVIDER_SITE_OTHER): Payer: Medicare Other | Admitting: Cardiovascular Disease

## 2020-04-06 ENCOUNTER — Encounter: Payer: Self-pay | Admitting: Physical Therapy

## 2020-04-06 VITALS — BP 150/76 | HR 96 | Ht 66.5 in | Wt 201.0 lb

## 2020-04-06 DIAGNOSIS — I480 Paroxysmal atrial fibrillation: Secondary | ICD-10-CM | POA: Diagnosis not present

## 2020-04-06 DIAGNOSIS — I10 Essential (primary) hypertension: Secondary | ICD-10-CM

## 2020-04-06 DIAGNOSIS — E1142 Type 2 diabetes mellitus with diabetic polyneuropathy: Secondary | ICD-10-CM | POA: Diagnosis not present

## 2020-04-06 DIAGNOSIS — I82409 Acute embolism and thrombosis of unspecified deep veins of unspecified lower extremity: Secondary | ICD-10-CM

## 2020-04-06 DIAGNOSIS — M25561 Pain in right knee: Secondary | ICD-10-CM

## 2020-04-06 DIAGNOSIS — K219 Gastro-esophageal reflux disease without esophagitis: Secondary | ICD-10-CM

## 2020-04-06 DIAGNOSIS — M6281 Muscle weakness (generalized): Secondary | ICD-10-CM

## 2020-04-06 DIAGNOSIS — M79672 Pain in left foot: Secondary | ICD-10-CM | POA: Diagnosis not present

## 2020-04-06 DIAGNOSIS — R262 Difficulty in walking, not elsewhere classified: Secondary | ICD-10-CM

## 2020-04-06 DIAGNOSIS — M79671 Pain in right foot: Secondary | ICD-10-CM | POA: Diagnosis not present

## 2020-04-06 DIAGNOSIS — R2681 Unsteadiness on feet: Secondary | ICD-10-CM | POA: Diagnosis not present

## 2020-04-06 NOTE — Therapy (Signed)
[place] MAIN [facility] SERVICES [address] [place], [place], [address] Phone: [phone]   Fax:  [phone]  Physical Therapy Treatment  Patient Details  Name: Angela Bentley MRN: [id number] Date of Birth: 20-Jan-1941 Referring Provider (PT): [name], MD   Encounter Date: 04/06/2020   PT End of Session - 04/06/20 1829    Visit Number 18    Number of Visits 25    Date for PT Re-Evaluation 04/08/20    PT Start Time 9371    PT Stop Time 6967    PT Time Calculation (min) 44 min    Equipment Utilized During Treatment Gait belt    Activity Tolerance Patient tolerated treatment well;No increased pain    Behavior During Therapy WFL for tasks assessed/performed           Past Medical History:  Diagnosis Date  . Balance problems   . Brain tumor ([place])    a. Left intraventricular tumor ->stable by 06/2017 MRI. Followed @ [facility].  . Gait disturbance    a. Unsteady on feet/balance difficulty.  . Generalized osteoarthritis of multiple sites   . GERD (gastroesophageal reflux disease)   . History of DVT (deep vein thrombosis) 2016   estrogen and airflights. Rx with xarelto for 3 months  . History of stress test    a. 11/2016 ETT: No ST/T changes.  Performed to assess PAC/PVC burden with activity ->No ectopy noted.  . Hypertension   . Hyperthyroidism    treated briefly in college  . Insomnia   . Obesity   . PAF (paroxysmal atrial fibrillation) ([place])    a. 12/2016 Event Monitor: brief run of PAF-->CHA2DS2VASc = 5--> Xarelto.  . Plantar fasciitis   . Retinal tear of left eye   . Rosacea   . Type 2 diabetes, controlled, with peripheral neuropathy ([place])    a. 11/2016 A1c 7.4.  . Uterine fibroid     Past Surgical History:  Procedure Laterality Date  . CARPAL TUNNEL RELEASE Right    Dr Burney Gauze  . CATARACT EXTRACTION    . RETINAL TEAR REPAIR CRYOTHERAPY     10/09/13, then again 3/15    There were no vitals filed for this visit.    Subjective Assessment - 04/06/20 1342    Subjective Patient stated that she is doing well today, but is having more balance issues. Pain better than last session.    Pertinent History 79 yo Female reports imbalance several years. She has a brain tumor on right parietal lobe which her MD states has nothing to do with imbalance; She does reports having increased headaches in the area over last 3 mornings but feels like coffee will help resolve symptoms; She has had hearing checked recently which the audiologist said that her hearing is fine but right ear is slightly better than left ear; She does have tinnitus in left ear;  She has also had an eye exam recently with no significant change in vision; 3 years ago this started after [place]. She saw Dr. Pryor Ochoa who couldn't find anything significant but was concerned she could have had a viral infection that could have killed off some nerve cells in left ear. She has had a few falls in last year. She denies any numbness/tingling in BLE but does have a PMH significant of diabetes;    How long can you sit comfortably? variable; 10 min or 1 hour    How long can you stand comfortably? Short time  How long can you walk comfortably? 0.25 miles    Diagnostic tests MRI in March 2020 shows Horizontal tear anterior body of the medial meniscus    Patient Stated Goals "Improving standing position, reduce knee valgus position, reduce pain especially with improving sleep, increase endurance    Currently in Pain? Yes    Pain Score 1     Pain Orientation Right    Pain Descriptors / Indicators Aching;Sore    Pain Type Acute pain    Pain Onset 1 to 4 weeks ago           TREATMENT:   Gait on treadmill 1.5-1.8 mph with 2 HHA x5 min; patient does exhibit slight increased LLE foot drag with increased knee flexion at initial contact requiring cues for increased step length and increased DF at heel strike;     Seated LAQ 3# x10 each LE, cued for ER  of foot to engage inner quad.   Instructed patient in balance exercise:   Standing on foam pad:   Feet apart: eyes open/closed heel raises 3x10 reps with CGA for safety,   Standing one foot on airex, one foot on 4 inch step with dyna disc:               Eyes open unsupported 30secs x3              Eyes closed unsupported 15 sec hold x2 reps;   Patient required min A for safety with increased loss of balance to left side;     Standing on firm surface mid stance forward/backward weight shift with heel lift, toe raise x10 reps with good positioning noted;     Gait on level surface with lateral head turns side/side x80 feet x2 laps with supervision with mild lateral sway but patient able to self correct; x19f with vertical head turns, pt without difficulty.  Tandem walking ~731fwith CGA. Pt challenged by this activity often needed to correct imbalances.     Patient required min VCs for balance stability, including to increase trunk control for less loss of balance with staggered stance;          Pt response/clinical impression: Patient exhibited great motivation and good tolerance to therapy today. No complaints of R knee pain with LAQ with ER. Did complain of LE fatigue with longer holds on uneven surfaces today but able to complete. Pt needed CGA-minA throughout for safety and would benefit from further skilled PT intervention to continue to challenge patient and progress towards goals to maximize safety/mobility.       PT Education - 04/06/20 1342    Education Details balance/strength, exercise form    Person(s) Educated Patient    Methods Explanation;Verbal cues    Comprehension Verbalized understanding;Returned demonstration;Verbal cues required;Need further instruction            PT Short Term Goals - 03/11/20 0818      PT SHORT TERM GOAL #1   Title Patient will be adherent to HEP at least 3x a week to improve functional strength and balance for better safety at  home.    Baseline 5/25: not done much this last week;    Time 4    Period Weeks    Status Not Met    Target Date 03/17/20      PT SHORT TERM GOAL #2   Title Patient will increase RLE knee AROM extension to within 5 degrees of neutral for better standing and walking tolerance;  Time 4    Period Weeks    Status Partially Met    Target Date 03/17/20      PT SHORT TERM GOAL #3   Title Patient will reports maximum of 1 sleep disturbances per night over last 3 days to exhibit improved sleeping tolerance with less knee pain and reduced fatigue.     Baseline 5/25: once or less, not very often;    Time 4    Period Weeks    Status Achieved    Target Date 01/22/20             PT Long Term Goals - 03/11/20 0819      PT LONG TERM GOAL #1   Title Patient will increase BLE gross strength to 4+/5 as to improve functional strength for independent gait, increased standing tolerance and increased ADL ability.    Baseline 5/25: see flowsheet    Time 4    Period Weeks    Status Partially Met    Target Date 04/08/20      PT LONG TERM GOAL #2   Title Patient will improve FOTO score to >55/100 to indicate improved functional mobility with ADLs.    Baseline 4/7: 51/100, 5/25: 50/100    Time 4    Period Weeks    Status Not Met    Target Date 04/08/20      PT LONG TERM GOAL #3   Title Patient will report a worst pain of 3/10 on VAS in      right knee       to improve tolerance with ADLs and reduced symptoms with activities.     Baseline 5/25: 3-4/10 in right knee;    Time 4    Period Weeks    Status Partially Met    Target Date 04/08/20      PT LONG TERM GOAL #4   Title Patient will complete >1000 feet on 6 min walk test without increase in knee pain to improve community ambulator distance for functional tasks and return to PLOF.    Baseline 5/25: 1150 feet    Time 4    Period Weeks    Status Achieved    Target Date 04/08/20      PT LONG TERM GOAL #5   Title Patient (> 75 years  old) will complete five times sit to stand test in < 15 seconds indicating an increased LE strength and improved balance.    Baseline 5/25: 15.15 sec    Time 4    Period Weeks    Status Partially Met    Target Date 04/08/20      PT LONG TERM GOAL #6   Title Patient will increase Functional Gait Assessment score to >20/30 as to reduce fall risk and improve dynamic gait safety with community ambulation.    Time 4    Period Weeks    Status New    Target Date 04/08/20                 Plan - 04/06/20 1343    Clinical Impression Statement Patient exhibited great motivation and good tolerance to therapy today. No complaints of R knee pain with LAQ with ER. Did complain of LE fatigue with longer holds on uneven surfaces today but able to complete. Pt needed CGA-minA throughout for safety and would benefit from further skilled PT intervention to continue to challenge patient and progress towards goals to maximize safety/mobility.    Personal Factors and Comorbidities Age;Comorbidity 3+;Past/Current  Experience    Comorbidities PMH: significant for DMx2 (controlled), HTN (controlled), chronic dizziness/impaired vision in left eye, imbalance, A-fib (on blood thinner),    Examination-Activity Limitations Bed Mobility;Caring for Others;Lift;Locomotion Level;Sit;Sleep;Squat;Stairs;Stand;Transfers    Examination-Participation Restrictions Church;Cleaning;Community Activity;Shop;Volunteer;Yard Work    [employer] officer Evolving/Moderate complexity    Rehab Potential Good    PT Frequency 2x / week    PT Duration 4 weeks    PT Treatment/Interventions ADLs/Self Care Home Management;Cryotherapy;Electrical Stimulation;Moist Heat;Gait training;Stair training;Functional mobility training;Therapeutic activities;Therapeutic exercise;Balance training;Neuromuscular re-education;Patient/family education;Manual techniques;Passive range of motion;Dry needling;Energy conservation;Taping    PT Next  Visit Plan work on balance    PT Home Exercise Plan advanced with balance;    Consulted and Agree with Plan of Care Patient           Patient will benefit from skilled therapeutic intervention in order to improve the following deficits and impairments:  Abnormal gait, Decreased balance, Decreased endurance, Decreased mobility, Difficulty walking, Improper body mechanics, Decreased range of motion, Decreased activity tolerance, Decreased strength, Pain, Dizziness  Visit Diagnosis: Acute pain of right knee  Pain in left foot  Muscle weakness (generalized)  Unsteadiness on feet  Difficulty in walking, not elsewhere classified  Pain in right foot     Problem List Patient Active Problem List   Diagnosis Date Noted  . Skin tag 03/18/2020  . Trapezius strain, left, initial encounter 02/21/2020  . Left-sided chest pain 10/23/2019  . Overactive bladder 10/09/2019  . Lumbar sprain 03/28/2019  . Tinnitus, bilateral 11/28/2017  . Abnormal MRI of head 11/14/2017  . Meningioma ([place]) 09/21/2017  . PAF (paroxysmal atrial fibrillation) ([place]) 02/03/2017  . Preventative health care 06/02/2016  . Advance directive discussed with patient 06/02/2016  . Achilles tendonitis 03/11/2016  . Hypertension   . DVT, lower extremity, recurrent ([place])   . Type 2 diabetes, controlled, with peripheral neuropathy ([place])   . Generalized osteoarthritis of multiple sites   . GERD (gastroesophageal reflux disease)   . Hyperthyroidism   . Retinal hole of right eye 06/26/2014  . Amblyopia, right 05/08/2014  . Status post intraocular lens implant 05/08/2014    Lieutenant Diego PT, DPT 2:38 PM,04/06/20   [place] MAIN [facility] SERVICES [address] [place], [place], [address] Phone: [phone]   Fax:  [phone]  Name: Angela Bentley MRN: [id number] Date of Birth: [birth year]

## 2020-04-06 NOTE — Patient Instructions (Signed)

## 2020-04-09 ENCOUNTER — Ambulatory Visit: Payer: Medicare Other | Admitting: Physical Therapy

## 2020-04-09 ENCOUNTER — Other Ambulatory Visit: Payer: Self-pay

## 2020-04-09 ENCOUNTER — Encounter: Payer: Self-pay | Admitting: Physical Therapy

## 2020-04-09 DIAGNOSIS — R2681 Unsteadiness on feet: Secondary | ICD-10-CM | POA: Diagnosis not present

## 2020-04-09 DIAGNOSIS — M6281 Muscle weakness (generalized): Secondary | ICD-10-CM | POA: Diagnosis not present

## 2020-04-09 DIAGNOSIS — M79672 Pain in left foot: Secondary | ICD-10-CM | POA: Diagnosis not present

## 2020-04-09 DIAGNOSIS — M79671 Pain in right foot: Secondary | ICD-10-CM | POA: Diagnosis not present

## 2020-04-09 DIAGNOSIS — M25561 Pain in right knee: Secondary | ICD-10-CM | POA: Diagnosis not present

## 2020-04-09 DIAGNOSIS — R262 Difficulty in walking, not elsewhere classified: Secondary | ICD-10-CM

## 2020-04-09 NOTE — Therapy (Signed)
[place] MAIN [facility] SERVICES [address] [place], [place], [address] Phone: [phone]   Fax:  [phone]  Physical Therapy Treatment  Patient Details  Name: Angela Bentley MRN: [id number] Date of Birth: [birth year] Referring Provider (PT): [name], MD   Encounter Date: 04/09/2020   PT End of Session - 04/09/20 1018    Visit Number 19    Number of Visits 25    Date for PT Re-Evaluation 05/07/20    PT Start Time 3428    PT Stop Time 1100    PT Time Calculation (min) 42 min    Equipment Utilized During Treatment Gait belt    Activity Tolerance Patient tolerated treatment well;No increased pain    Behavior During Therapy WFL for tasks assessed/performed           Past Medical History:  Diagnosis Date  . Balance problems   . Brain tumor ([place])    a. Left intraventricular tumor ->stable by 06/2017 MRI. Followed @ [facility].  . Gait disturbance    a. Unsteady on feet/balance difficulty.  . Generalized osteoarthritis of multiple sites   . GERD (gastroesophageal reflux disease)   . History of DVT (deep vein thrombosis) 2016   estrogen and airflights. Rx with xarelto for 3 months  . History of stress test    a. 11/2016 ETT: No ST/T changes.  Performed to assess PAC/PVC burden with activity ->No ectopy noted.  . Hypertension   . Hyperthyroidism    treated briefly in college  . Insomnia   . Obesity   . PAF (paroxysmal atrial fibrillation) ([place])    a. 12/2016 Event Monitor: brief run of PAF-->CHA2DS2VASc = 5--> Xarelto.  . Plantar fasciitis   . Retinal tear of left eye   . Rosacea   . Type 2 diabetes, controlled, with peripheral neuropathy ([place])    a. 11/2016 A1c 7.4.  . Uterine fibroid     Past Surgical History:  Procedure Laterality Date  . CARPAL TUNNEL RELEASE Right    Dr Burney Gauze  . CATARACT EXTRACTION    . RETINAL TEAR REPAIR CRYOTHERAPY     10/09/13, then again 3/15    There were no vitals filed for this visit.    Subjective Assessment - 04/09/20 1026    Subjective Patient reports doing well; She reports doing better today with her balance. She also reports her knees are doing pretty good. She said at times she feels like she is sleeping funny which is making her knees sore. but they are doing well today;    Pertinent History 79 yo Female reports imbalance several years. She has a brain tumor on right parietal lobe which her MD states has nothing to do with imbalance; She does reports having increased headaches in the area over last 3 mornings but feels like coffee will help resolve symptoms; She has had hearing checked recently which the audiologist said that her hearing is fine but right ear is slightly better than left ear; She does have tinnitus in left ear;  She has also had an eye exam recently with no significant change in vision; 3 years ago this [place] She saw Dr. Pryor Ochoa who couldn't find anything significant but was concerned she could have had a viral infection that could have killed off some nerve cells in left ear. She has had a few falls in last year. She denies any numbness/tingling in BLE but does have a PMH significant of diabetes;  How long can you sit comfortably? variable; 10 min or 1 hour    How long can you stand comfortably? Short time    How long can you walk comfortably? 0.25 miles    Diagnostic tests MRI in March 2020 shows Horizontal tear anterior body of the medial meniscus    Patient Stated Goals "Improving standing position, reduce knee valgus position, reduce pain especially with improving sleep, increase endurance    Currently in Pain? No/denies    Pain Onset 1 to 4 weeks ago    Multiple Pain Sites No              [facility] PT Assessment - 04/09/20 0001      Observation/Other Assessments   Focus on Therapeutic Outcomes (FOTO)  52/100      Standardized Balance Assessment   Five times sit to stand comments  15.2 sec without HHA       Functional Gait  Assessment   Gait Level Surface Walks 20 ft in less than 5.5 sec, no assistive devices, good speed, no evidence for imbalance, normal gait pattern, deviates no more than 6 in outside of the 12 in walkway width.    Change in Gait Speed Able to smoothly change walking speed without loss of balance or gait deviation. Deviate no more than 6 in outside of the 12 in walkway width.    Gait with Horizontal Head Turns Performs head turns smoothly with slight change in gait velocity (eg, minor disruption to smooth gait path), deviates 6-10 in outside 12 in walkway width, or uses an assistive device.    Gait with Vertical Head Turns Performs task with slight change in gait velocity (eg, minor disruption to smooth gait path), deviates 6 - 10 in outside 12 in walkway width or uses assistive device    Gait and Pivot Turn Pivot turns safely within 3 sec and stops quickly with no loss of balance.    Step Over Obstacle Is able to step over one shoe box (4.5 in total height) but must slow down and adjust steps to clear box safely. May require verbal cueing.    Gait with Narrow Base of Support Ambulates 4-7 steps.    Gait with Eyes Closed Walks 20 ft, uses assistive device, slower speed, mild gait deviations, deviates 6-10 in outside 12 in walkway width. Ambulates 20 ft in less than 9 sec but greater than 7 sec.    Ambulating Backwards Walks 20 ft, uses assistive device, slower speed, mild gait deviations, deviates 6-10 in outside 12 in walkway width.    Steps Alternating feet, must use rail.    Total Score 21    FGA comment: lower risk for falls, improved from 18/30 on 03/11/20             TREATMENT: Gait on treadmill 1.59mh with 2-1 HHA x5 min; patient does exhibit slight increased LLE foot drag with increased knee flexion at initial contact requiring cues for increased step length and increased DF at heel strike;    Resisted walking: 12.5# forward/backward, side/side x4 way, x2 laps each;  Required CGA for safety with min VCs for weight shift for better dynamic control;    Instructed patient in functional gait assessment and other outcome measures to address goals, see above;  Patient toleratedsessionwell.She does exhibit improved balance this session. Patient reports adherence with HEP. Patient expressed desire to transition to gym for continued fitness. She would benefit from skilled PT intervention to educate patient on safe equipment to  use and how to transition to fitness center;                        PT Education - 04/09/20 1018    Education Details balance/strength, HEP    Person(s) Educated Patient    Methods Explanation;Verbal cues    Comprehension Verbalized understanding;Returned demonstration;Verbal cues required;Need further instruction            PT Short Term Goals - 04/09/20 1057      PT SHORT TERM GOAL #1   Title Patient will be adherent to HEP at least 3x a week to improve functional strength and balance for better safety at home.    Baseline 5/25: not done much this last week; 7/15: doing at least 3-4x a week;    Time 4    Period Weeks    Status Achieved    Target Date 03/17/20      PT SHORT TERM GOAL #2   Title Patient will increase RLE knee AROM extension to within 5 degrees of neutral for better standing and walking tolerance;     Time 4    Period Weeks    Status Achieved    Target Date 03/17/20      PT SHORT TERM GOAL #3   Title Patient will reports maximum of 1 sleep disturbances per night over last 3 days to exhibit improved sleeping tolerance with less knee pain and reduced fatigue.     Baseline 5/25: once or less, not very often;    Time 4    Period Weeks    Status Achieved    Target Date 01/22/20             PT Long Term Goals - 04/09/20 1054      PT LONG TERM GOAL #1   Title Patient will increase BLE gross strength to 4+/5 as to improve functional strength for independent gait, increased standing  tolerance and increased ADL ability.    Baseline 5/25: see flowsheet    Time 4    Period Weeks    Status Partially Met    Target Date 05/07/20      PT LONG TERM GOAL #2   Title Patient will improve FOTO score to >55/100 to indicate improved functional mobility with ADLs.    Baseline 4/7: 51/100, 5/25: 50/100, 7/14: 52/100    Time 4    Period Weeks    Status Partially Met    Target Date 05/07/20      PT LONG TERM GOAL #3   Title Patient will report a worst pain of 3/10 on VAS in      right knee       to improve tolerance with ADLs and reduced symptoms with activities.     Baseline 5/25: 3-4/10 in right knee; 7/14: 2-3/10    Time 4    Period Weeks    Status Achieved    Target Date 05/07/20      PT LONG TERM GOAL #4   Title Patient will complete >1000 feet on 6 min walk test without increase in knee pain to improve community ambulator distance for functional tasks and return to PLOF.    Baseline 5/25: 1150 feet    Time 4    Period Weeks    Status Achieved    Target Date 05/07/20      PT LONG TERM GOAL #5   Title Patient (> 105 years old) will complete five times sit to  stand test in < 15 seconds indicating an increased LE strength and improved balance.    Baseline 5/25: 15.15 sec, 7/14: 15.2 sec    Time 4    Period Weeks    Status Partially Met    Target Date 05/07/20      PT LONG TERM GOAL #6   Title Patient will increase Functional Gait Assessment score to >20/30 as to reduce fall risk and improve dynamic gait safety with community ambulation.    Baseline 7/14: 21/30    Time 4    Period Weeks    Status Achieved    Target Date 05/07/20                 Plan - 04/09/20 1229    Clinical Impression Statement Patient motivated and participated well within session. She does exhibit improved balance this session scoring higher on Functional Gait assessment. She also reports reduced knee pain consistently over last several weeks. Patient has met most goals and made  progress towards other goals. She would benefit from 1-2 additional sessions to educate patient on safe transition exercise program including gym program for continued fitness. Patient agreeable.    Personal Factors and Comorbidities Age;Comorbidity 3+;Past/Current Experience    Comorbidities PMH: significant for DMx2 (controlled), HTN (controlled), chronic dizziness/impaired vision in left eye, imbalance, A-fib (on blood thinner),    Examination-Activity Limitations Bed Mobility;Caring for Others;Lift;Locomotion Level;Sit;Sleep;Squat;Stairs;Stand;Transfers    Examination-Participation Restrictions Church;Cleaning;Community Activity;Shop;Volunteer;Yard Work    [employer] officer Evolving/Moderate complexity    Rehab Potential Good    PT Frequency 2x / week    PT Duration 4 weeks    PT Treatment/Interventions ADLs/Self Care Home Management;Cryotherapy;Electrical Stimulation;Moist Heat;Gait training;Stair training;Functional mobility training;Therapeutic activities;Therapeutic exercise;Balance training;Neuromuscular re-education;Patient/family education;Manual techniques;Passive range of motion;Dry needling;Energy conservation;Taping    PT Next Visit Plan work on balance    PT Home Exercise Plan advanced with balance;    Consulted and Agree with Plan of Care Patient           Patient will benefit from skilled therapeutic intervention in order to improve the following deficits and impairments:  Abnormal gait, Decreased balance, Decreased endurance, Decreased mobility, Difficulty walking, Improper body mechanics, Decreased range of motion, Decreased activity tolerance, Decreased strength, Pain, Dizziness  Visit Diagnosis: Acute pain of right knee  Muscle weakness (generalized)  Unsteadiness on feet  Difficulty in walking, not elsewhere classified     Problem List Patient Active Problem List   Diagnosis Date Noted  . Skin tag 03/18/2020  . Trapezius strain, left,  initial encounter 02/21/2020  . Left-sided chest pain 10/23/2019  . Overactive bladder 10/09/2019  . Lumbar sprain 03/28/2019  . Tinnitus, bilateral 11/28/2017  . Abnormal MRI of head 11/14/2017  . Meningioma ([place]) 09/21/2017  . PAF (paroxysmal atrial fibrillation) ([place]) 02/03/2017  . Preventative health care 06/02/2016  . Advance directive discussed with patient 06/02/2016  . Achilles tendonitis 03/11/2016  . Hypertension   . DVT, lower extremity, recurrent ([place])   . Type 2 diabetes, controlled, with peripheral neuropathy ([place])   . Generalized osteoarthritis of multiple sites   . GERD (gastroesophageal reflux disease)   . Hyperthyroidism   . Retinal hole of right eye 06/26/2014  . Amblyopia, right 05/08/2014  . Status post intraocular lens implant 05/08/2014    Edric Fetterman PT, DPT 04/09/2020, 12:40 PM  [place] MAIN [facility] SERVICES [address] [place], [place], [address] Phone: [phone]   Fax:  [phone]  Name: Angela Bentley MRN:  [id number] Date of Birth: 02-21-1941

## 2020-04-13 DIAGNOSIS — D320 Benign neoplasm of cerebral meninges: Secondary | ICD-10-CM | POA: Diagnosis not present

## 2020-04-14 ENCOUNTER — Ambulatory Visit: Payer: Medicare Other | Admitting: Physical Therapy

## 2020-04-14 ENCOUNTER — Encounter: Payer: Self-pay | Admitting: Physical Therapy

## 2020-04-14 ENCOUNTER — Other Ambulatory Visit: Payer: Self-pay

## 2020-04-14 DIAGNOSIS — R262 Difficulty in walking, not elsewhere classified: Secondary | ICD-10-CM

## 2020-04-14 DIAGNOSIS — R2681 Unsteadiness on feet: Secondary | ICD-10-CM | POA: Diagnosis not present

## 2020-04-14 DIAGNOSIS — M25561 Pain in right knee: Secondary | ICD-10-CM

## 2020-04-14 DIAGNOSIS — M6281 Muscle weakness (generalized): Secondary | ICD-10-CM | POA: Diagnosis not present

## 2020-04-14 DIAGNOSIS — M79672 Pain in left foot: Secondary | ICD-10-CM | POA: Diagnosis not present

## 2020-04-14 DIAGNOSIS — M79671 Pain in right foot: Secondary | ICD-10-CM | POA: Diagnosis not present

## 2020-04-14 NOTE — Therapy (Signed)
[place] MAIN [facility] SERVICES [address] [place], [place], [address] Phone: [phone]   Fax:  [phone]  Physical Therapy Treatment/Discharge Summary Physical Therapy Progress Note   Dates of reporting period  02/25/20   to   04/14/20   Patient Details  Name: Angela Bentley MRN: [id number] Date of Birth: [birth year] Referring Provider (PT): [name], MD   Encounter Date: 04/14/2020   PT End of Session - 04/14/20 1152    Visit Number 20    Number of Visits 25    Date for PT Re-Evaluation 05/07/20    PT Start Time 1146    PT Stop Time 1230    PT Time Calculation (min) 44 min    Equipment Utilized During Treatment Gait belt    Activity Tolerance Patient tolerated treatment well;No increased pain    Behavior During Therapy WFL for tasks assessed/performed           Past Medical History:  Diagnosis Date  . Balance problems   . Brain tumor ([place])    a. Left intraventricular tumor ->stable by 06/2017 MRI. Followed @ [facility].  . Gait disturbance    a. Unsteady on feet/balance difficulty.  . Generalized osteoarthritis of multiple sites   . GERD (gastroesophageal reflux disease)   . History of DVT (deep vein thrombosis) 2016   estrogen and airflights. Rx with xarelto for 3 months  . History of stress test    a. 11/2016 ETT: No ST/T changes.  Performed to assess PAC/PVC burden with activity ->No ectopy noted.  . Hypertension   . Hyperthyroidism    treated briefly in college  . Insomnia   . Obesity   . PAF (paroxysmal atrial fibrillation) ([place])    a. 12/2016 Event Monitor: brief run of PAF-->CHA2DS2VASc = 5--> Xarelto.  . Plantar fasciitis   . Retinal tear of left eye   . Rosacea   . Type 2 diabetes, controlled, with peripheral neuropathy ([place])    a. 11/2016 A1c 7.4.  . Uterine fibroid     Past Surgical History:  Procedure Laterality Date  . CARPAL TUNNEL RELEASE Right    Dr Burney Gauze  . CATARACT EXTRACTION    . RETINAL TEAR  REPAIR CRYOTHERAPY     10/09/13, then again 3/15    There were no vitals filed for this visit.   Subjective Assessment - 04/14/20 1151    Subjective Patient reports doing well; She reports mild soreness in right knee after sleeping with RLE crossed over left;    Pertinent History 79 yo Female reports imbalance several years. She has a brain tumor on right parietal lobe which her MD states has nothing to do with imbalance; She does reports having increased headaches in the area over last 3 mornings but feels like coffee will help resolve symptoms; She has had hearing checked recently which the audiologist said that her hearing is fine but right ear is slightly better than left ear; She does have tinnitus in left ear;  She has also had an eye exam recently with no significant change in vision; 3 years ago this [place] She saw Dr. Pryor Ochoa who couldn't find anything significant but was concerned she could have had a viral infection that could have killed off some nerve cells in left ear. She has had a few falls in last year. She denies any numbness/tingling in BLE but does have a PMH significant of diabetes;    How long can you sit  comfortably? variable; 10 min or 1 hour    How long can you stand comfortably? Short time    How long can you walk comfortably? 0.25 miles    Diagnostic tests MRI in March 2020 shows Horizontal tear anterior body of the medial meniscus    Patient Stated Goals "Improving standing position, reduce knee valgus position, reduce pain especially with improving sleep, increase endurance    Currently in Pain? No/denies    Pain Onset 1 to 4 weeks ago    Multiple Pain Sites No             TREATMENT: Gait on treadmill 1.38mh with 2-1 HHA x5 min; Patient exhibits less foot drag on LLE this session; This could be related to wearing different shoes;    Resisted walking: 12.5# forward/backward, side/side x4 way, x2 laps each; Required CGA for  safety with min VCs for weight shift for better dynamic control;   Ascend/descend 4 steps with rail assist forward reciprocal x3 sets; progressed to step negotiation with head turns side/side  Reinforced HEP with [facility] continue with walking dog and at least 2-3 times a week try to do more aerobic activity such as riding recumbent bike or walking without dog. Pt verbalized understanding  She denies any pain at end of session; She is pleased with progress.  Goals were recently assessed on 04/08/20, please see updated goals;                           PT Education - 04/14/20 1152    Education Details balance/strength, HEP    Person(s) Educated Patient    Methods Explanation;Verbal cues    Comprehension Verbalized understanding;Returned demonstration;Verbal cues required;Need further instruction            PT Short Term Goals - 04/09/20 1057      PT SHORT TERM GOAL #1   Title Patient will be adherent to HEP at least 3x a week to improve functional strength and balance for better safety at home.    Baseline 5/25: not done much this last week; 7/15: doing at least 3-4x a week;    Time 4    Period Weeks    Status Achieved    Target Date 03/17/20      PT SHORT TERM GOAL #2   Title Patient will increase RLE knee AROM extension to within 5 degrees of neutral for better standing and walking tolerance;     Time 4    Period Weeks    Status Achieved    Target Date 03/17/20      PT SHORT TERM GOAL #3   Title Patient will reports maximum of 1 sleep disturbances per night over last 3 days to exhibit improved sleeping tolerance with less knee pain and reduced fatigue.     Baseline 5/25: once or less, not very often;    Time 4    Period Weeks    Status Achieved    Target Date 01/22/20             PT Long Term Goals - 04/15/20 02563     PT LONG TERM GOAL #1   Title Patient will increase BLE gross strength to 4+/5 as  to improve functional strength for independent gait, increased standing tolerance and increased ADL ability.    Baseline 5/25: see flowsheet    Time 4    Period Weeks  Status Achieved    Target Date 05/07/20      PT LONG TERM GOAL #2   Title Patient will improve FOTO score to >55/100 to indicate improved functional mobility with ADLs.    Baseline 4/7: 51/100, 5/25: 50/100, 7/14: 52/100    Time 4    Period Weeks    Status Partially Met    Target Date 05/07/20      PT LONG TERM GOAL #3   Title Patient will report a worst pain of 3/10 on VAS in      right knee       to improve tolerance with ADLs and reduced symptoms with activities.     Baseline 5/25: 3-4/10 in right knee; 7/14: 2-3/10    Time 4    Period Weeks    Status Achieved    Target Date 05/07/20      PT LONG TERM GOAL #4   Title Patient will complete >1000 feet on 6 min walk test without increase in knee pain to improve community ambulator distance for functional tasks and return to PLOF.    Baseline 5/25: 1150 feet    Time 4    Period Weeks    Status Achieved    Target Date 05/07/20      PT LONG TERM GOAL #5   Title Patient (> 5 years old) will complete five times sit to stand test in < 15 seconds indicating an increased LE strength and improved balance.    Baseline 5/25: 15.15 sec, 7/14: 15.2 sec    Time 4    Period Weeks    Status Partially Met    Target Date 05/07/20      PT LONG TERM GOAL #6   Title Patient will increase Functional Gait Assessment score to >20/30 as to reduce fall risk and improve dynamic gait safety with community ambulation.    Baseline 7/14: 21/30    Time 4    Period Weeks    Status Achieved    Target Date 05/07/20                 Plan - 04/15/20 0817    Clinical Impression Statement patient motivated and participated well within session. She reports minimal to no right knee pain and has not had knee pain over last several weeks. Patient also reports feeling good with her  balance today. She reports having some good/bad days with balance and states that its variable. Patient educated in ways to progress HEP. She plans on joining fab stab class at [facility] and will continue walking her dog daily for exercise. She has met most goals and is appropriate for discharge at this time.    Personal Factors and Comorbidities Age;Comorbidity 3+;Past/Current Experience    Comorbidities PMH: significant for DMx2 (controlled), HTN (controlled), chronic dizziness/impaired vision in left eye, imbalance, A-fib (on blood thinner),    Examination-Activity Limitations Bed Mobility;Caring for Others;Lift;Locomotion Level;Sit;Sleep;Squat;Stairs;Stand;Transfers    Examination-Participation Restrictions Church;Cleaning;Community Activity;Shop;Volunteer;Yard Work    Merchant navy officer Evolving/Moderate complexity    Rehab Potential Good    PT Frequency 2x / week    PT Duration 4 weeks    PT Treatment/Interventions ADLs/Self Care Home Management;Cryotherapy;Electrical Stimulation;Moist Heat;Gait training;Stair training;Functional mobility training;Therapeutic activities;Therapeutic exercise;Balance training;Neuromuscular re-education;Patient/family education;Manual techniques;Passive range of motion;Dry needling;Energy conservation;Taping    PT Next Visit Plan work on balance    PT Home Exercise Plan advanced with balance;    Consulted and Agree with Plan of Care Patient  Patient will benefit from skilled therapeutic intervention in order to improve the following deficits and impairments:  Abnormal gait, Decreased balance, Decreased endurance, Decreased mobility, Difficulty walking, Improper body mechanics, Decreased range of motion, Decreased activity tolerance, Decreased strength, Pain, Dizziness  Visit Diagnosis: Acute pain of right knee  Muscle weakness (generalized)  Unsteadiness on feet  Difficulty in walking, not elsewhere classified     Problem  List Patient Active Problem List   Diagnosis Date Noted  . Skin tag 03/18/2020  . Trapezius strain, left, initial encounter 02/21/2020  . Left-sided chest pain 10/23/2019  . Overactive bladder 10/09/2019  . Lumbar sprain 03/28/2019  . Tinnitus, bilateral 11/28/2017  . Abnormal MRI of head 11/14/2017  . Meningioma ([place]) 09/21/2017  . PAF (paroxysmal atrial fibrillation) ([place]) 02/03/2017  . Preventative health care 06/02/2016  . Advance directive discussed with patient 06/02/2016  . Achilles tendonitis 03/11/2016  . Hypertension   . DVT, lower extremity, recurrent ([place])   . Type 2 diabetes, controlled, with peripheral neuropathy ([place])   . Generalized osteoarthritis of multiple sites   . GERD (gastroesophageal reflux disease)   . Hyperthyroidism   . Retinal hole of right eye 06/26/2014  . Amblyopia, right 05/08/2014  . Status post intraocular lens implant 05/08/2014    Akiem Urieta PT, DPT 04/15/2020, 8:35 AM  [place] MAIN [facility] SERVICES [address] [place], [place], [address] Phone: [phone]   Fax:  [phone]  Name: Angela Bentley MRN: [id number] Date of Birth: 07-30-1941

## 2020-04-21 ENCOUNTER — Encounter: Payer: Self-pay | Admitting: Internal Medicine

## 2020-04-21 ENCOUNTER — Other Ambulatory Visit: Payer: Self-pay

## 2020-04-21 ENCOUNTER — Ambulatory Visit (INDEPENDENT_AMBULATORY_CARE_PROVIDER_SITE_OTHER): Payer: Medicare Other | Admitting: Internal Medicine

## 2020-04-21 VITALS — BP 122/68 | HR 79 | Temp 97.6°F | Ht 67.0 in | Wt 202.0 lb

## 2020-04-21 DIAGNOSIS — E1142 Type 2 diabetes mellitus with diabetic polyneuropathy: Secondary | ICD-10-CM | POA: Diagnosis not present

## 2020-04-21 DIAGNOSIS — R490 Dysphonia: Secondary | ICD-10-CM | POA: Insufficient documentation

## 2020-04-21 DIAGNOSIS — I480 Paroxysmal atrial fibrillation: Secondary | ICD-10-CM

## 2020-04-21 LAB — POCT GLYCOSYLATED HEMOGLOBIN (HGB A1C): Hemoglobin A1C: 7.3 % — AB (ref 4.0–5.6)

## 2020-04-21 MED ORDER — OMEPRAZOLE 20 MG PO CPDR
20.0000 mg | DELAYED_RELEASE_CAPSULE | Freq: Every day | ORAL | 3 refills | Status: DC
Start: 2020-04-21 — End: 2021-01-07

## 2020-04-21 NOTE — Assessment & Plan Note (Signed)
Lab Results  Component Value Date   HGBA1C 7.3 (A) 04/21/2020   Still good control  Continue actos and amaryl Didn't tolerate metformin Neuropathy mostly affects balance---discussed walking stick

## 2020-04-21 NOTE — Assessment & Plan Note (Signed)
Most likely related to GERD--though she doesn't have daytime symptoms Will try omeprazole--if persists, needs ENT evaluation

## 2020-04-21 NOTE — Progress Notes (Signed)
Subjective:    Patient ID: Angela Bentley, female    DOB: 12/21/1940, 79 y.o.   MRN: [id number]  HPI Here for follow up of diabetes This visit occurred during the SARS-CoV-2 public health emergency.  Safety protocols were in place, including screening questions prior to the visit, additional usage of staff PPE, and extensive cleaning of exam room while observing appropriate contact time as indicated for disinfecting solutions.   Skin tag went away and then recurred She will see the dermatologist for this  Hasn't been checking sugars No apparent problems---she "knows when it goes up" Didn't tolerate metformin--still on actos and amaryl Some numbness in left instep at night---no extremity pain  No chest pain No SOB No palpitations No dizziness or syncope   Current Outpatient Medications on File Prior to Visit  Medication Sig Dispense Refill  . diclofenac sodium (VOLTAREN) 1 % GEL Apply 4 g topically 4 (four) times daily. (Patient taking differently: Apply 4 g topically 4 (four) times daily as needed. ) 100 g 2  . fluticasone (VERAMYST) 27.5 MCG/SPRAY nasal spray Place 2 sprays into the nose daily as needed for allergies.     . furosemide (LASIX) 20 MG tablet Take 1 tablet (20 mg total) by mouth daily as needed. 30 tablet 3  . glimepiride (AMARYL) 1 MG tablet TAKE 1 TABLET EVERY DAY WITH BREAKFAST 90 tablet 3  . HYDROcodone-homatropine (HYCODAN) 5-1.5 MG/5ML syrup Take 5 mLs by mouth at bedtime as needed for cough. 120 mL 0  . losartan-hydrochlorothiazide (HYZAAR) 100-12.5 MG tablet TAKE 1 TABLET EVERY DAY 90 tablet 3  . metoprolol succinate (TOPROL-XL) 25 MG 24 hr tablet TAKE 1 TABLET (25 MG TOTAL) BY MOUTH DAILY. 90 tablet 3  . Multiple Vitamin (MULTIVITAMIN) tablet Take 1 tablet by mouth daily.    Marland Kitchen nystatin cream (MYCOSTATIN) Apply 1 application topically 2 (two) times daily. Apply to affected area BID for up to 7 days. (Patient taking differently: Apply 1 application topically 2  (two) times daily. Apply to affected area BID PRN) 30 g 1  . pioglitazone (ACTOS) 45 MG tablet TAKE 1 TABLET EVERY DAY 90 tablet 3  . potassium chloride SA (K-DUR,KLOR-CON) 20 MEQ tablet Take 1 tablet (20 mEq total) by mouth daily as needed. 30 tablet 3  . XARELTO 20 MG TABS tablet TAKE 1 TABLET (20 MG TOTAL) BY MOUTH DAILY WITH SUPPER. 90 tablet 3   No current facility-administered medications on file prior to visit.    Allergies  Allergen Reactions  . Penicillins     Has patient had a PCN reaction causing immediate rash, facial/tongue/throat swelling, SOB or lightheadedness with hypotension: Yes Has patient had a PCN reaction causing severe rash involving mucus membranes or skin necrosis: Yes Has patient had a PCN reaction that required hospitalization No Has patient had a PCN reaction occurring within the last 10 years: No If all of the above answers are "NO", then may proceed with Cephalosporin use.    . Sulfa Antibiotics     Rash/itching  . Azithromycin Palpitations    Past Medical History:  Diagnosis Date  . Balance problems   . Brain tumor ([place])    a. Left intraventricular tumor ->stable by 06/2017 MRI. Followed @ [facility].  . Gait disturbance    a. Unsteady on feet/balance difficulty.  . Generalized osteoarthritis of multiple sites   . GERD (gastroesophageal reflux disease)   . History of DVT (deep vein thrombosis) 2016   estrogen and airflights. Rx with xarelto for  3 months  . History of stress test    a. 11/2016 ETT: No ST/T changes.  Performed to assess PAC/PVC burden with activity ->No ectopy noted.  . Hypertension   . Hyperthyroidism    treated briefly in college  . Insomnia   . Obesity   . PAF (paroxysmal atrial fibrillation) ([place])    a. 12/2016 Event Monitor: brief run of PAF-->CHA2DS2VASc = 5--> Xarelto.  . Plantar fasciitis   . Retinal tear of left eye   . Rosacea   . Type 2 diabetes, controlled, with peripheral neuropathy ([place])    a. 11/2016 A1c 7.4.  .  Uterine fibroid     Past Surgical History:  Procedure Laterality Date  . CARPAL TUNNEL RELEASE Right    Dr Burney Gauze  . CATARACT EXTRACTION    . RETINAL TEAR REPAIR CRYOTHERAPY     10/09/13, then again 3/15    Family History  Problem Relation Age of Onset  . Diabetes Father   . Pancreatic cancer Father   . Diabetes Other        grandmother    Social History   Socioeconomic History  . Marital status: Married    Spouse name: Not on file  . Number of children: 3  . Years of education: Not on file  . Highest education level: Not on file  Occupational History  . Occupation: Non Patent attorney    Comment: [employer] and others  Tobacco Use  . Smoking status: Former Smoker    Quit date: 09/26/1993    Years since quitting: 26.5  . Smokeless tobacco: Never Used  . Tobacco comment: quit in 1996  Vaping Use  . Vaping Use: Never used  Substance and Sexual Activity  . Alcohol use: Yes    Alcohol/week: 0.0 - 1.0 standard drinks  . Drug use: No  . Sexual activity: Not Currently    Partners: Male  Other Topics Concern  . Not on file  Social History Narrative   Has living will   Husband is health care POA-- or daughter Izora Gala   Would accept resuscitation attempts   Would not want tube feeds if cognitively unaware   Social Determinants of Health   Financial Resource Strain: Low Risk   . Difficulty of Paying Living Expenses: Not very hard  Food Insecurity:   . Worried About Charity fundraiser in the Last Year:   . Arboriculturist in the Last Year:   Transportation Needs:   . Film/video editor (Medical):   Marland Kitchen Lack of Transportation (Non-Medical):   Physical Activity:   . Days of Exercise per Week:   . Minutes of Exercise per Session:   Stress:   . Feeling of Stress :   Social Connections:   . Frequency of Communication with Friends and Family:   . Frequency of Social Gatherings with Friends and Family:   . Attends Religious Services:   . Active Member of Clubs or  Organizations:   . Attends Archivist Meetings:   Marland Kitchen Marital Status:   Intimate Partner Violence:   . Fear of Current or Ex-Partner:   . Emotionally Abused:   Marland Kitchen Physically Abused:   . Sexually Abused:    Review of Systems Some trouble remembering nouns and names Sleep is variable---will have spell of 2-3 bad nights every few weeks Notices hoarseness--voice is changed No heartburn Past smoker---a few for 30 years    Objective:   Physical Exam Constitutional:  Appearance: Normal appearance.  HENT:     Nose:     Comments: No oral lesions Cardiovascular:     Rate and Rhythm: Normal rate and regular rhythm.     Pulses: Normal pulses.     Heart sounds: No murmur heard.  No gallop.   Pulmonary:     Effort: Pulmonary effort is normal.     Breath sounds: Normal breath sounds. No wheezing or rales.  Musculoskeletal:     Cervical back: Neck supple.     Right lower leg: No edema.     Left lower leg: No edema.  Lymphadenopathy:     Cervical: No cervical adenopathy.  Skin:    Comments: No lesions  Neurological:     Mental Status: She is alert.  Psychiatric:        Mood and Affect: Mood normal.        Behavior: Behavior normal.            Assessment & Plan:

## 2020-04-21 NOTE — Patient Instructions (Signed)
Please start the omeprazole daily on an empty stomach. If your hoarseness is not better in 2-4 weeks, you will need an appointment with [facility] to look at your larynx. If your symptoms are better, just continue the omeprazole.

## 2020-04-21 NOTE — Assessment & Plan Note (Signed)
No apparent recurrence Remains on the metoprolol and xarelto

## 2020-04-28 DIAGNOSIS — D329 Benign neoplasm of meninges, unspecified: Secondary | ICD-10-CM | POA: Diagnosis not present

## 2020-05-13 ENCOUNTER — Ambulatory Visit (INDEPENDENT_AMBULATORY_CARE_PROVIDER_SITE_OTHER): Payer: Medicare Other | Admitting: Dermatology

## 2020-05-13 ENCOUNTER — Other Ambulatory Visit: Payer: Self-pay

## 2020-05-13 DIAGNOSIS — L820 Inflamed seborrheic keratosis: Secondary | ICD-10-CM | POA: Diagnosis not present

## 2020-05-13 DIAGNOSIS — L309 Dermatitis, unspecified: Secondary | ICD-10-CM

## 2020-05-13 DIAGNOSIS — L570 Actinic keratosis: Secondary | ICD-10-CM | POA: Diagnosis not present

## 2020-05-13 DIAGNOSIS — D485 Neoplasm of uncertain behavior of skin: Secondary | ICD-10-CM

## 2020-05-13 DIAGNOSIS — D489 Neoplasm of uncertain behavior, unspecified: Secondary | ICD-10-CM

## 2020-05-13 MED ORDER — TRIAMCINOLONE ACETONIDE 0.1 % EX OINT
1.0000 | TOPICAL_OINTMENT | Freq: Two times a day (BID) | CUTANEOUS | 1 refills | Status: DC
Start: 2020-05-13 — End: 2020-10-14

## 2020-05-13 NOTE — Progress Notes (Signed)
Follow-Up Visit   Subjective  Angela Bentley is a 79 y.o. female who presents for the following: Skin Tag (lower lip).  Patient presents today for a skin tag on her lower lip that is irritated and that she would like to have removed. She also has an itchy rash in between her breasts present for the past week. Patient has a h/o SCC Right dorsum hand, treated with Moh's  The following portions of the chart were reviewed this encounter and updated as appropriate:  Tobacco  Allergies  Meds  Problems  Med Hx  Surg Hx  Fam Hx      Review of Systems:  No other skin or systemic complaints except as noted in HPI or Assessment and Plan.  Objective  Well appearing patient in no apparent distress; mood and affect are within normal limits.  A focused examination was performed including face, nose, lips, neck, chest. Relevant physical exam findings are noted in the Assessment and Plan.  Objective  Chest - Medial [facility]): Multiple erythematous waxy stuck-on papules  Objective  Right supratip nose: Erythematous thin papules/macules with gritty scale.   Objective  Right Lower [place] Lip: 0.2 cm pink papule  Objective  Chest - Medial [facility]): Scaly erythematous papules and plaques   Assessment & Plan  Inflamed seborrheic keratosis Chest - Medial (Center)  Start TMC 0.1 ointment twice daily for 2 weeks  Avoid applying to face, groin, and axilla. Use as directed. Risk of skin atrophy with long-term use reviewed   Will consider Cryotherapy in future if not resolved. Will recheck on Follow up  triamcinolone ointment (KENALOG) 0.1 % - Chest - Medial (Center)  AK (actinic keratosis) Right supratip nose  Cryotherapy today Prior to procedure, discussed risks of blister formation, small wound, skin dyspigmentation, or rare scar following cryotherapy.    Destruction of lesion - Right supratip nose  Destruction method: cryotherapy   Informed consent: discussed and consent  obtained   Lesion destroyed using liquid nitrogen: Yes   Outcome: patient tolerated procedure well with no complications   Post-procedure details: wound care instructions given    Neoplasm of uncertain behavior Right Lower [place] Lip  Epidermal / dermal shaving  Lesion diameter (cm):  0.2 Informed consent: discussed and consent obtained   Anesthesia: the lesion was anesthetized in a standard fashion   Anesthetic:  1% lidocaine w/ epinephrine 1-100,000 buffered w/ 8.4% NaHCO3 Instrument used: flexible razor blade   Hemostasis achieved with: pressure, aluminum chloride and electrodesiccation   Outcome: patient tolerated procedure well   Post-procedure details: wound care instructions given   Post-procedure details comment:  Mupirocin ointment and bandage applied  Specimen 1 - Surgical pathology Differential Diagnosis: Skin Tag vs pyogenic granuloma vs other Check Margins: No  Shave removal today  Dermatitis Chest - Medial (Center)  Start Triamcinolone 0.1% ointment twice a day as needed up to two weeks  Topical steroids (such as triamcinolone, fluocinolone, fluocinonide, mometasone, clobetasol, halobetasol, betamethasone, hydrocortisone) can cause thinning and lightening of the skin if they are used for too long in the same area. Your physician has selected the right strength medicine for your problem and area affected on the body. Please use your medication only as directed by your physician to prevent side effects.    Return in about 6 weeks (around 06/24/2020) for TBSE.  I, Donzetta Kohut, CMA, am acting as scribe for Forest Gleason, MD .  Documentation: I have reviewed the above documentation for accuracy and completeness, and I agree with  the above.  Forest Gleason, MD  Documentation: I have reviewed the above documentation for accuracy and completeness, and I agree with the above.  Forest Gleason, MD

## 2020-05-13 NOTE — Patient Instructions (Addendum)
Recommend daily broad spectrum sunscreen SPF 30+ to sun-exposed areas, reapply every 2 hours as needed. Call for new or changing lesions.  Prior to procedure, discussed risks of blister formation, small wound, skin dyspigmentation, or rare scar following cryotherapy.  Liquid nitrogen was applied for 10-12 seconds to the skin lesion and the expected blistering or scabbing reaction explained. Do not pick at the area. Patient reminded to expect hypopigmented scars from the procedure. Return if lesion fails to fully resolve.  Cryotherapy Aftercare  . Wash gently with soap and water everyday.   . Apply Vaseline and Band-Aid daily until healed.   Wound Care Instructions  1. Cleanse wound gently with soap and water once a day then pat dry with clean gauze. Apply a thing coat of Petrolatum (petroleum jelly, "Vaseline") over the wound (unless you have an allergy to this). We recommend that you use a new, sterile tube of Vaseline. Do not pick or remove scabs. Do not remove the yellow or white "healing tissue" from the base of the wound.  2. Cover the wound with fresh, clean, nonstick gauze and secure with paper tape. You may use Band-Aids in place of gauze and tape if the would is small enough, but would recommend trimming much of the tape off as there is often too much. Sometimes Band-Aids can irritate the skin.  3. You should call the office for your biopsy report after 1 week if you have not already been contacted.  4. If you experience any problems, such as abnormal amounts of bleeding, swelling, significant bruising, significant pain, or evidence of infection, please call the office immediately.  5. FOR ADULT SURGERY PATIENTS: If you need something for pain relief you may take 1 extra strength Tylenol (acetaminophen) AND 2 Ibuprofen (200mg each) together every 4 hours as needed for pain. (do not take these if you are allergic to them or if you have a reason you should not take them.) Typically, you  may only need pain medication for 1 to 3 days.     

## 2020-05-19 NOTE — Progress Notes (Signed)
Skin , right lower vermilion lip SEBORRHEIC KERATOSIS, IRRITATED  This is a benign growth or "wisdom spot". No additional treatment is needed.

## 2020-05-25 ENCOUNTER — Encounter: Payer: Self-pay | Admitting: Dermatology

## 2020-06-18 DIAGNOSIS — D329 Benign neoplasm of meninges, unspecified: Secondary | ICD-10-CM | POA: Diagnosis not present

## 2020-06-18 DIAGNOSIS — R220 Localized swelling, mass and lump, head: Secondary | ICD-10-CM | POA: Diagnosis not present

## 2020-06-22 DIAGNOSIS — Z23 Encounter for immunization: Secondary | ICD-10-CM | POA: Diagnosis not present

## 2020-06-25 ENCOUNTER — Other Ambulatory Visit: Payer: Self-pay

## 2020-06-25 ENCOUNTER — Encounter: Payer: Self-pay | Admitting: Dermatology

## 2020-06-25 ENCOUNTER — Ambulatory Visit (INDEPENDENT_AMBULATORY_CARE_PROVIDER_SITE_OTHER): Payer: Medicare Other | Admitting: Dermatology

## 2020-06-25 DIAGNOSIS — B079 Viral wart, unspecified: Secondary | ICD-10-CM | POA: Diagnosis not present

## 2020-06-25 DIAGNOSIS — Z85828 Personal history of other malignant neoplasm of skin: Secondary | ICD-10-CM

## 2020-06-25 DIAGNOSIS — D489 Neoplasm of uncertain behavior, unspecified: Secondary | ICD-10-CM | POA: Diagnosis not present

## 2020-06-25 DIAGNOSIS — Z1283 Encounter for screening for malignant neoplasm of skin: Secondary | ICD-10-CM

## 2020-06-25 DIAGNOSIS — D1800 Hemangioma unspecified site: Secondary | ICD-10-CM

## 2020-06-25 DIAGNOSIS — L821 Other seborrheic keratosis: Secondary | ICD-10-CM | POA: Diagnosis not present

## 2020-06-25 DIAGNOSIS — L570 Actinic keratosis: Secondary | ICD-10-CM

## 2020-06-25 DIAGNOSIS — L578 Other skin changes due to chronic exposure to nonionizing radiation: Secondary | ICD-10-CM

## 2020-06-25 DIAGNOSIS — D225 Melanocytic nevi of trunk: Secondary | ICD-10-CM | POA: Diagnosis not present

## 2020-06-25 DIAGNOSIS — L814 Other melanin hyperpigmentation: Secondary | ICD-10-CM | POA: Diagnosis not present

## 2020-06-25 DIAGNOSIS — D229 Melanocytic nevi, unspecified: Secondary | ICD-10-CM

## 2020-06-25 DIAGNOSIS — D239 Other benign neoplasm of skin, unspecified: Secondary | ICD-10-CM

## 2020-06-25 HISTORY — DX: Other benign neoplasm of skin, unspecified: D23.9

## 2020-06-25 HISTORY — DX: Actinic keratosis: L57.0

## 2020-06-25 NOTE — Patient Instructions (Addendum)
Recommend daily broad spectrum sunscreen SPF 30+ to sun-exposed areas, reapply every 2 hours as needed. Call for new or changing lesions.  Recommend Nicotinamide 500mg  twice per day to lower risk of non-melanoma skin cancer by approximately 25%.

## 2020-06-25 NOTE — Progress Notes (Signed)
Follow-Up Visit   Subjective  Angela Bentley is a 79 y.o. female who presents for the following: TBSE.  Patient here for full body skin exam and skin cancer screening. Patient has no concerns today, has h/o SCC Right dorsal hand 09/07/17  The following portions of the chart were reviewed this encounter and updated as appropriate:  Tobacco  Allergies  Meds  Problems  Med Hx  Surg Hx  Fam Hx      Review of Systems:  No other skin or systemic complaints except as noted in HPI or Assessment and Plan.  Objective  Well appearing patient in no apparent distress; mood and affect are within normal limits.  A full examination was performed including scalp, head, eyes, ears, nose, lips, neck, chest, axillae, abdomen, back, buttocks, bilateral upper extremities, bilateral lower extremities, hands, feet, fingers, toes, fingernails, and toenails. All findings within normal limits unless otherwise noted below.  Objective  Left Dorsal Hand, Left Shoulder, Left Shoulder - Posterior, Left Wrist - Posterior, Right Dorsal Hand x 2 (2), Right Nasal Dorsum: Erythematous thin papules/macules with gritty scale.     Objective  Left pretibia proximal: 0.5 cm cutaneous horn over lying erythmatous papule     Objective  Left Pretibia distal: 1.1 cm scaly pink plaque with central erosion     Objective  Right Flank: 0.3 cm irregular brown focus within 0.5 cm waxy papule      Assessment & Plan  AK (actinic keratosis) (7) Left Shoulder; Left Shoulder - Posterior; Left Wrist - Posterior; Left Dorsal Hand; Right Dorsal Hand x 2 (2); Right Nasal Dorsum  Cryotherapy today Prior to procedure, discussed risks of blister formation, small wound, skin dyspigmentation, or rare scar following cryotherapy.   Hypertrophic AK vs ISK, Ln2 today, will do biopsy at recheck if indicated at that time.  Destruction of lesion - Left Dorsal Hand, Left Shoulder, Left Shoulder - Posterior, Left Wrist -  Posterior, Right Dorsal Hand x 2, Right Nasal Dorsum  Destruction method: cryotherapy   Informed consent: discussed and consent obtained   Lesion destroyed using liquid nitrogen: Yes   Outcome: patient tolerated procedure well with no complications   Post-procedure details: wound care instructions given    Neoplasm of uncertain behavior (3) Left pretibia proximal  Skin / nail biopsy Type of biopsy: tangential   Informed consent: discussed and consent obtained   Timeout: patient name, date of birth, surgical site, and procedure verified   Procedure prep:  Patient was prepped and draped in usual sterile fashion Prep type:  Isopropyl alcohol Anesthesia: the lesion was anesthetized in a standard fashion   Anesthetic:  1% lidocaine w/ epinephrine 1-100,000 buffered w/ 8.4% NaHCO3 Instrument used: flexible razor blade   Hemostasis achieved with: aluminum chloride   Outcome: patient tolerated procedure well   Post-procedure details: sterile dressing applied and wound care instructions given   Dressing type: bandage (mupirocin)    Specimen 1 - Surgical pathology Differential Diagnosis: Hypertrophic AK vs SCC vs other Check Margins: No 0.5 cm cutaneous horn over lying erythmatous papule  Left Pretibia distal  Skin / nail biopsy Type of biopsy: tangential   Informed consent: discussed and consent obtained   Timeout: patient name, date of birth, surgical site, and procedure verified   Procedure prep:  Patient was prepped and draped in usual sterile fashion Prep type:  Isopropyl alcohol Anesthesia: the lesion was anesthetized in a standard fashion   Anesthetic:  1% lidocaine w/ epinephrine 1-100,000 buffered w/ 8.4% NaHCO3  Instrument used: flexible razor blade   Hemostasis achieved with: aluminum chloride   Outcome: patient tolerated procedure well   Post-procedure details: sterile dressing applied and wound care instructions given   Dressing type: bandage (mupirocin)    Specimen 2  - Surgical pathology Differential Diagnosis: R/O BCC Check Margins: No 1.1 cm scaly pink plaque with central erosion  Right Flank  Skin / nail biopsy Type of biopsy: tangential   Informed consent: discussed and consent obtained   Timeout: patient name, date of birth, surgical site, and procedure verified   Procedure prep:  Patient was prepped and draped in usual sterile fashion Prep type:  Isopropyl alcohol Anesthesia: the lesion was anesthetized in a standard fashion   Anesthetic:  1% lidocaine w/ epinephrine 1-100,000 buffered w/ 8.4% NaHCO3 Instrument used: flexible razor blade   Hemostasis achieved with: aluminum chloride   Outcome: patient tolerated procedure well   Post-procedure details: sterile dressing applied and wound care instructions given   Dressing type: bandage (mupirocin)    Specimen 3 - Surgical pathology Differential Diagnosis: R/O Atypia vs nevus within sk Check Margins: No 0.3 cm irregular brown focus within 0.5 cm waxy papule   Lentigines - Scattered tan macules - Discussed due to sun exposure - Benign, observe - Call for any changes  Seborrheic Keratoses - Stuck-on, waxy, tan-brown papules and plaques  - Discussed benign etiology and prognosis. - Observe - Call for any changes  Melanocytic Nevi - Tan-brown and/or pink-flesh-colored symmetric macules and papules - Benign appearing on exam today - Observation - Call clinic for new or changing moles - Recommend daily use of broad spectrum spf 30+ sunscreen to sun-exposed areas.   Hemangiomas - Red papules - Discussed benign nature - Observe - Call for any changes  Actinic Damage - diffuse scaly erythematous macules with underlying dyspigmentation - Recommend daily broad spectrum sunscreen SPF 30+ to sun-exposed areas, reapply every 2 hours as needed.  - Call for new or changing lesions.  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  - Call if any new or changing lesions are noted between office visits  Skin cancer screening performed today.   Return in about 3 months (around 09/24/2020) for AK Follow up, and 6 months TBSE.  I, Donzetta Kohut, CMA, am acting as scribe for Forest Gleason, MD .  Documentation: I have reviewed the above documentation for accuracy and completeness, and I agree with the above.  Forest Gleason, MD

## 2020-06-26 DIAGNOSIS — J301 Allergic rhinitis due to pollen: Secondary | ICD-10-CM | POA: Diagnosis not present

## 2020-06-26 DIAGNOSIS — R490 Dysphonia: Secondary | ICD-10-CM | POA: Diagnosis not present

## 2020-06-26 DIAGNOSIS — K219 Gastro-esophageal reflux disease without esophagitis: Secondary | ICD-10-CM | POA: Diagnosis not present

## 2020-06-30 ENCOUNTER — Telehealth: Payer: Self-pay

## 2020-06-30 NOTE — Telephone Encounter (Signed)
Patient called and informed of biopsy results, patient verbalized understanding. Patient has follow up already scheduled on 09/24/20

## 2020-06-30 NOTE — Progress Notes (Signed)
1. Skin , left pretibia proximal VERRUCA VULGARIS, IRRITATED  This is a WART caused by the human papilloma virus. It is not dangerous but is contagious and can spread to other areas of skin or other people if it is not completely gone. No additional treatment is needed. However, if it comes back, we can freeze it in clinic with liquid nitrogen (a quick in office procedure) or you can also treat it at home with an over the counter salicylic wart treatment (slower).  2. Skin , left pretibia distal LICHENOID ACTINIC KERATOSIS --> LN2 in clinic  3. Skin , right flank DYSPLASTIC COMPOUND NEVUS WITH MODERATE ATYPIA SUPERIMPOSED ON A SEBORRHEIC KERATOSIS, CLOSE TO MARGIN --> This is a MODERATELY ATYPICAL MOLE. On the spectrum from normal mole to melanoma skin cancer, this is in between the two. - We need to recheck this area sometime in the next 6 months to be sure there is no evidence of the atypical mole coming back. If there is any color coming back, we would recommend repeating the biopsy to be sure the cells look normal.  - People who have a history of atypical moles do have a slightly increased risk of developing melanoma somewhere on the body, so a yearly full body skin exam by a dermatologist is recommended.  - Monthly self skin checks and daily sun protection are also recommended.  - Please call if you notice a dark spot coming back where this biopsy was taken.  - Please also call if you notice any new or changing spots anywhere else on the body before your follow-up visit.   MAs please call

## 2020-06-30 NOTE — Telephone Encounter (Signed)
-----   Message from Alfonso Patten, MD sent at 06/30/2020  1:25 PM EDT ----- 1. Skin , left pretibia proximal VERRUCA VULGARIS, IRRITATED  This is a WART caused by the human papilloma virus. It is not dangerous but is contagious and can spread to other areas of skin or other people if it is not completely gone. No additional treatment is needed. However, if it comes back, we can freeze it in clinic with liquid nitrogen (a quick in office procedure) or you can also treat it at home with an over the counter salicylic wart treatment (slower).  2. Skin , left pretibia distal LICHENOID ACTINIC KERATOSIS --> LN2 in clinic  3. Skin , right flank DYSPLASTIC COMPOUND NEVUS WITH MODERATE ATYPIA SUPERIMPOSED ON A SEBORRHEIC KERATOSIS, CLOSE TO MARGIN --> This is a MODERATELY ATYPICAL MOLE. On the spectrum from normal mole to melanoma skin cancer, this is in between the two. - We need to recheck this area sometime in the next 6 months to be sure there is no evidence of the atypical mole coming back. If there is any color coming back, we would recommend repeating the biopsy to be sure the cells look normal.  - People who have a history of atypical moles do have a slightly increased risk of developing melanoma somewhere on the body, so a yearly full body skin exam by a dermatologist is recommended.  - Monthly self skin checks and daily sun protection are also recommended.  - Please call if you notice a dark spot coming back where this biopsy was taken.  - Please also call if you notice any new or changing spots anywhere else on the body before your follow-up visit.   MAs please call

## 2020-07-08 ENCOUNTER — Telehealth: Payer: Self-pay | Admitting: Internal Medicine

## 2020-07-08 NOTE — Telephone Encounter (Signed)
Medication Refill - Medication:  glimepiride (AMARYL) 1 MG tablet XARELTO 20 MG TABS tablet losartan-hydrochlorothiazide (HYZAAR) 100-12.5 MG tablet metoprolol succinate (TOPROL-XL) 25 MG 24 hr tablet pioglitazone (ACTOS) 45 MG tablet    Has the patient contacted their pharmacy?  Yes letter sent to have script renewed. Please advise.   Preferred Pharmacy (with phone number or street name):  [place], [place] Phone:  [phone]  Fax:  [phone]

## 2020-07-09 DIAGNOSIS — D329 Benign neoplasm of meninges, unspecified: Secondary | ICD-10-CM | POA: Diagnosis not present

## 2020-07-09 MED ORDER — METOPROLOL SUCCINATE ER 25 MG PO TB24
25.0000 mg | ORAL_TABLET | Freq: Every day | ORAL | 3 refills | Status: DC
Start: 2020-07-09 — End: 2021-08-25

## 2020-07-09 MED ORDER — LOSARTAN POTASSIUM-HCTZ 100-12.5 MG PO TABS: 1.0000 | ORAL_TABLET | Freq: Every day | ORAL | 3 refills | Status: DC

## 2020-07-09 MED ORDER — GLIMEPIRIDE 1 MG PO TABS
1.0000 mg | ORAL_TABLET | Freq: Every day | ORAL | 3 refills | Status: DC
Start: 2020-07-09 — End: 2021-02-15

## 2020-07-09 MED ORDER — PIOGLITAZONE HCL 45 MG PO TABS
45.0000 mg | ORAL_TABLET | Freq: Every day | ORAL | 3 refills | Status: DC
Start: 2020-07-09 — End: 2021-08-25

## 2020-07-09 MED ORDER — RIVAROXABAN 20 MG PO TABS: ORAL_TABLET | ORAL | 3 refills | Status: DC

## 2020-07-09 NOTE — Telephone Encounter (Signed)
Rx sent electronically.  

## 2020-07-10 ENCOUNTER — Encounter: Payer: Self-pay | Admitting: Family Medicine

## 2020-07-10 ENCOUNTER — Ambulatory Visit (INDEPENDENT_AMBULATORY_CARE_PROVIDER_SITE_OTHER)
Admission: RE | Admit: 2020-07-10 | Discharge: 2020-07-10 | Disposition: A | Discharge status: Home / Self Care | Payer: Medicare Other | Source: Ambulatory Visit | Attending: Family Medicine | Admitting: Family Medicine

## 2020-07-10 ENCOUNTER — Ambulatory Visit (INDEPENDENT_AMBULATORY_CARE_PROVIDER_SITE_OTHER): Payer: Medicare Other | Admitting: Family Medicine

## 2020-07-10 ENCOUNTER — Other Ambulatory Visit: Payer: Self-pay

## 2020-07-10 VITALS — BP 124/76 | HR 75 | Temp 97.0°F | Ht 67.0 in | Wt 201.3 lb

## 2020-07-10 DIAGNOSIS — M25552 Pain in left hip: Secondary | ICD-10-CM | POA: Diagnosis not present

## 2020-07-10 DIAGNOSIS — M179 Osteoarthritis of knee, unspecified: Secondary | ICD-10-CM | POA: Insufficient documentation

## 2020-07-10 DIAGNOSIS — M25561 Pain in right knee: Secondary | ICD-10-CM | POA: Diagnosis not present

## 2020-07-10 DIAGNOSIS — M1711 Unilateral primary osteoarthritis, right knee: Secondary | ICD-10-CM | POA: Insufficient documentation

## 2020-07-10 MED ORDER — DICLOFENAC SODIUM 1 % EX GEL: 4.0000 g | Freq: Four times a day (QID) | CUTANEOUS | 0 refills | Status: DC | PRN

## 2020-07-10 NOTE — Progress Notes (Signed)
Subjective:    Patient ID: Angela Bentley, female    DOB: [birth year], 79 y.o.   MRN: [id number]  This visit occurred during the SARS-CoV-2 public health emergency.  Safety protocols were in place, including screening questions prior to the visit, additional usage of staff PPE, and extensive cleaning of exam room while observing appropriate contact time as indicated for disinfecting solutions.    HPI 79 yo pt of Dr [name] presents with L hip and R knee pain   Wt Readings from Last 3 Encounters:  07/10/20 201 lb 5 oz (91.3 kg)  04/21/20 202 lb (91.6 kg)  04/06/20 201 lb (91.2 kg)   31.53 kg/m  Symptoms started over 3 weeks ago   L hip pain (high up)  When she puts weight on it it bothers her (makes her limp)  Feels better to externally rotate hip  Sharp and feels a little unsteady  No groin pain   The changed gait makes her R knee hurt  She has had gel treatment for her R knee in the past  She goes to Dr Marlou Sa - OA of the knee with effusion in the past  Meniscus tear in the past  She takes some tylenol  No topical products    Getting ready for gamma knife surgery for meningioma    Last knee xray in chart  DG Knee Complete 4 Views Right (Accession [id number]) (Order [id number]) Imaging Date: 10/20/2018 Department: [facility] Released By/Authorizing: Cuthriell, Charline Bills, PA-C (auto-released)  Exam Status  Status  Final [99]  PACS Intelerad Image Link  Show images for DG Knee Complete 4 Views Right Study Result  Narrative & Impression  CLINICAL DATA:  Right knee pain and limited range of motion for 3 weeks. No known injury.  EXAM: RIGHT KNEE - COMPLETE 4+ VIEW  COMPARISON:  None.  FINDINGS: No evidence of fracture, dislocation, or joint effusion. No evidence of arthropathy or other focal bone abnormality. Soft tissues are unremarkable.  IMPRESSION: Negative.   Electronically Signed   By: Earle Gell  M.D.   On: 10/20/2018 17:52   Has voltaren gel but has not used it    Patient Active Problem List   Diagnosis Date Noted  . Left hip pain 07/10/2020  . Right knee pain 07/10/2020  . Hoarseness of voice 04/21/2020  . Skin tag 03/18/2020  . Trapezius strain, left, initial encounter 02/21/2020  . Left-sided chest pain 10/23/2019  . Overactive bladder 10/09/2019  . Lumbar sprain 03/28/2019  . Tinnitus, bilateral 11/28/2017  . Abnormal MRI of head 11/14/2017  . Meningioma ([place]) 09/21/2017  . PAF (paroxysmal atrial fibrillation) ([place]) 02/03/2017  . Preventative health care 06/02/2016  . Advance directive discussed with patient 06/02/2016  . Achilles tendonitis 03/11/2016  . Hypertension   . DVT, lower extremity, recurrent ([place])   . Type 2 diabetes, controlled, with peripheral neuropathy ([place])   . Generalized osteoarthritis of multiple sites   . GERD (gastroesophageal reflux disease)   . Hyperthyroidism   . Retinal hole of right eye 06/26/2014  . Amblyopia, right 05/08/2014  . Status post intraocular lens implant 05/08/2014   Past Medical History:  Diagnosis Date  . Balance problems   . Brain tumor ([place])    a. Left intraventricular tumor ->stable by 06/2017 MRI. Followed @ [facility].  . Gait disturbance    a. Unsteady on feet/balance difficulty.  . Generalized osteoarthritis of multiple sites   . GERD (gastroesophageal reflux disease)   .  History of DVT (deep vein thrombosis) 2016   estrogen and airflights. Rx with xarelto for 3 months  . History of stress test    a. 11/2016 ETT: No ST/T changes.  Performed to assess PAC/PVC burden with activity ->No ectopy noted.  . Hypertension   . Hyperthyroidism    treated briefly in college  . Insomnia   . Obesity   . PAF (paroxysmal atrial fibrillation) ([place])    a. 12/2016 Event Monitor: brief run of PAF-->CHA2DS2VASc = 5--> Xarelto.  . Plantar fasciitis   . Retinal tear of left eye   . Rosacea   . Squamous cell carcinoma of skin  09/07/2017   R dorsum of hand   . Type 2 diabetes, controlled, with peripheral neuropathy ([place])    a. 11/2016 A1c 7.4.  . Uterine fibroid    Past Surgical History:  Procedure Laterality Date  . CARPAL TUNNEL RELEASE Right    Dr Burney Gauze  . CATARACT EXTRACTION    . RETINAL TEAR REPAIR CRYOTHERAPY     10/09/13, then again 3/15   Social History   Tobacco Use  . Smoking status: Former Smoker    Quit date: 09/26/1993    Years since quitting: 26.8  . Smokeless tobacco: Never Used  . Tobacco comment: quit in 1996  Vaping Use  . Vaping Use: Never used  Substance Use Topics  . Alcohol use: Yes    Alcohol/week: 0.0 - 1.0 standard drinks  . Drug use: No   Family History  Problem Relation Age of Onset  . Diabetes Father   . Pancreatic cancer Father   . Diabetes Other        grandmother   Allergies  Allergen Reactions  . Penicillins     Has patient had a PCN reaction causing immediate rash, facial/tongue/throat swelling, SOB or lightheadedness with hypotension: Yes Has patient had a PCN reaction causing severe rash involving mucus membranes or skin necrosis: Yes Has patient had a PCN reaction that required hospitalization No Has patient had a PCN reaction occurring within the last 10 years: No If all of the above answers are "NO", then may proceed with Cephalosporin use.    . Sulfa Antibiotics     Rash/itching  . Azithromycin Palpitations   Current Outpatient Medications on File Prior to Visit  Medication Sig Dispense Refill  . diclofenac sodium (VOLTAREN) 1 % GEL Apply 4 g topically 4 (four) times daily. (Patient taking differently: Apply 4 g topically 4 (four) times daily as needed. ) 100 g 2  . fluticasone (VERAMYST) 27.5 MCG/SPRAY nasal spray Place 2 sprays into the nose daily as needed for allergies.     . furosemide (LASIX) 20 MG tablet Take 1 tablet (20 mg total) by mouth daily as needed. 30 tablet 3  . glimepiride (AMARYL) 1 MG tablet Take 1 tablet (1 mg total) by mouth  daily with breakfast. 90 tablet 3  . losartan-hydrochlorothiazide (HYZAAR) 100-12.5 MG tablet Take 1 tablet by mouth daily. 90 tablet 3  . metoprolol succinate (TOPROL-XL) 25 MG 24 hr tablet Take 1 tablet (25 mg total) by mouth daily. 90 tablet 3  . Multiple Vitamin (MULTIVITAMIN) tablet Take 1 tablet by mouth daily.    Marland Kitchen nystatin cream (MYCOSTATIN) Apply 1 application topically 2 (two) times daily. Apply to affected area BID for up to 7 days. (Patient taking differently: Apply 1 application topically 2 (two) times daily. Apply to affected area BID PRN) 30 g 1  . omeprazole (PRILOSEC) 20 MG capsule  Take 1 capsule (20 mg total) by mouth daily. 90 capsule 3  . pioglitazone (ACTOS) 45 MG tablet Take 1 tablet (45 mg total) by mouth daily. 90 tablet 3  . potassium chloride SA (K-DUR,KLOR-CON) 20 MEQ tablet Take 1 tablet (20 mEq total) by mouth daily as needed. 30 tablet 3  . rivaroxaban (XARELTO) 20 MG TABS tablet TAKE 1 TABLET (20 MG TOTAL) BY MOUTH DAILY WITH SUPPER. 90 tablet 3  . triamcinolone ointment (KENALOG) 0.1 % Apply 1 application topically 2 (two) times daily. Avoid applying to face, groin, and axilla. Use as directed. Risk of skin atrophy with long-term use reviewed. 80 g 1   No current facility-administered medications on file prior to visit.    Review of Systems  Constitutional: Negative for activity change, appetite change, fatigue, fever and unexpected weight change.  HENT: Negative for congestion, ear pain, rhinorrhea, sinus pressure and sore throat.   Eyes: Negative for pain, redness and visual disturbance.  Respiratory: Negative for cough, shortness of breath and wheezing.   Cardiovascular: Negative for chest pain and palpitations.  Gastrointestinal: Negative for abdominal pain, blood in stool, constipation and diarrhea.  Endocrine: Negative for polydipsia and polyuria.  Genitourinary: Negative for dysuria, frequency and urgency.  Musculoskeletal: Positive for arthralgias and  gait problem. Negative for back pain, joint swelling and myalgias.  Skin: Negative for pallor and rash.  Allergic/Immunologic: Negative for environmental allergies.  Neurological: Negative for dizziness, syncope and headaches.  Hematological: Negative for adenopathy. Does not bruise/bleed easily.  Psychiatric/Behavioral: Negative for decreased concentration and dysphoric mood. The patient is not nervous/anxious.        Objective:   Physical Exam Constitutional:      General: She is not in acute distress.    Appearance: Normal appearance. She is obese. She is not ill-appearing.  Cardiovascular:     Rate and Rhythm: Normal rate.     Pulses: Normal pulses.  Pulmonary:     Effort: Pulmonary effort is normal.     Breath sounds: Normal breath sounds.  Musculoskeletal:     Right hip: Normal.     Left hip: Bony tenderness present. No deformity or crepitus. Normal range of motion. Normal strength.     Comments: Right knee No  effusion , slight lateral puffiness No warmth to the touch  No crepitus  ROM: hurts with full flex , apprehensive with full ext mcmurray-nl Bounce test -nl  Stability: Anterior drawer-nl Lachman exam -nl  Tenderness -mild medial joint line  L hip: Mildly tender over great trochanter  No groin tenderness  Discomfort to fully flex and internally rotate     Skin:    General: Skin is warm and dry.     Findings: No erythema or rash.  Neurological:     Mental Status: She is alert.     Sensory: No sensory deficit.     Motor: No weakness.  Psychiatric:        Mood and Affect: Mood normal.           Assessment & Plan:   Problem List Items Addressed This Visit      Other   Left hip pain - Primary    Mostly lateral hip area (not back) - radiates slt to the front but denies groin pain  Tender over greater trochanter Some discomfort with int rotation and full flexion  Suspect bursitis/tendonitis  Xray today  Adv use of tylenol and diclofenac gel    Ice if helpful  Given handout on  troch bursitis and exercises  Pend rad rev-consider PT       Relevant Orders   DG Hip Unilat W OR W/O Pelvis 2-3 Views Left (Completed)   Right knee pain    This is acute on chronic (per pt OA and meniscal injury in the past) Worsened by gait change (hip pain)  Adv relative rest/diclofenac gel and f/u with orthopedics

## 2020-07-10 NOTE — Patient Instructions (Addendum)
Continue tylenol if it helps   Use a walker or cane if needed for support and pain relief   Try the voltaren gel on knee and hip as directed    Follow up with Dr Tonita Cong for you knee  He may also be able to help with the hip  Xray today of hip  We will get a reading late today or Monday   Try ice on hip or knee if helpful 10 minutes at a time   Try the exercises

## 2020-07-12 NOTE — Assessment & Plan Note (Signed)
Mostly lateral hip area (not back) - radiates slt to the front but denies groin pain  Tender over greater trochanter Some discomfort with int rotation and full flexion  Suspect bursitis/tendonitis  Xray today  Adv use of tylenol and diclofenac gel  Ice if helpful  Given handout on troch bursitis and exercises  Pend rad rev-consider PT

## 2020-07-12 NOTE — Assessment & Plan Note (Signed)
This is acute on chronic (per pt OA and meniscal injury in the past) Worsened by gait change (hip pain)  Adv relative rest/diclofenac gel and f/u with orthopedics

## 2020-07-14 ENCOUNTER — Telehealth: Payer: Self-pay | Admitting: *Deleted

## 2020-07-14 NOTE — Telephone Encounter (Signed)
Left VM requesting pt to call the office back regarding xrays

## 2020-07-19 ENCOUNTER — Encounter: Payer: Self-pay | Admitting: Dermatology

## 2020-07-21 NOTE — Telephone Encounter (Signed)
Called and family advise pt was sleeping, will try to call back later

## 2020-07-21 NOTE — Telephone Encounter (Signed)
Pt is returning the call from [place] regarding x-rays   Best contact [phone]

## 2020-07-23 ENCOUNTER — Other Ambulatory Visit: Payer: Self-pay | Admitting: Family Medicine

## 2020-07-23 DIAGNOSIS — M25552 Pain in left hip: Secondary | ICD-10-CM

## 2020-07-23 NOTE — Telephone Encounter (Signed)
-----   Message from Tammi Sou, [place] sent at 07/23/2020  3:23 PM EDT ----- Called pt to f/u of xray results. Pt notified of results and Dr. Marliss Coots comments. Pt said she has seen [facility] for PT multiple times so she would like [place] to put a referral in for her to go back to her. She will call their office directly to schedule PT but wants an "official referral placed". Pt said only has 2 issues with the PT order, the 1st one is she is having some radiation brain surgery scheduled Mid Nov and she isn't sure if she can start PT or if she should wait until after her procedure to start PT, pt said she wouldn't know when she can start PT so I advise pt she should call her neurosurgeon and advise them of the situation and see what their recommendations are on PT. Pt said she will call them so she knows when she can start PT. Pt said the other issue is she is still in a lot of pain. Pt said stepping down is the worse, she is doing the exercises and that's going ok but when she walks it hurts and the more she walks the worse her hip is getting, pt said she is using a cane now due to the pain. Pt said she hasn't taken any OTC pain meds because she didn't know how that would affect her brain before surgery but she doesn't know what she should do, please advise

## 2020-07-23 NOTE — Telephone Encounter (Signed)
Addressed through result notes  

## 2020-07-23 NOTE — Telephone Encounter (Signed)
Referral placed for PT For pain I would start with tylenol and diclofenac gel to the painful area  I do not want to add much to that without knowing when her surgery is and what is contraindicated before it

## 2020-07-24 DIAGNOSIS — Z23 Encounter for immunization: Secondary | ICD-10-CM | POA: Diagnosis not present

## 2020-07-24 MED ORDER — LIDOCAINE 5 % EX PTCH: 1.0000 | MEDICATED_PATCH | CUTANEOUS | 0 refills | Status: DC

## 2020-07-24 NOTE — Telephone Encounter (Signed)
I pended a px for lidocaine patch to use as needed -perhaps this will help a bit (to pharmacy of choice)  She can also ask her surgeon what is safe for her to use this close to surgery

## 2020-07-24 NOTE — Addendum Note (Signed)
Addended by: Loura Pardon A on: 07/24/2020 04:25 PM   Modules accepted: Orders

## 2020-07-24 NOTE — Telephone Encounter (Signed)
Left VM letting pt know Dr. Marliss Coots comments and Rx was sen't to pharmacy

## 2020-07-24 NOTE — Telephone Encounter (Signed)
Pt notified of Dr. Marliss Coots comments pt said she has been trying diclofenac but it doesn't seem to be helping she thinks she needs something stronger but with her upcoming brain surgery she knows that may not be the best idea. Pt has already called PT and they said they will start sometime in Dec (after surgery), pt said she guesses she will have to just "live" with the pain for now since nothing seems to be helping

## 2020-07-24 NOTE — Addendum Note (Signed)
Addended by: Tammi Sou on: 07/24/2020 05:02 PM   Modules accepted: Orders

## 2020-07-27 HISTORY — PX: OTHER SURGICAL HISTORY: SHX169

## 2020-07-28 MED ORDER — DIAZEPAM 2 MG PO TABS: 2.0000 mg | ORAL_TABLET | Freq: Two times a day (BID) | ORAL | 0 refills | Status: DC | PRN

## 2020-08-10 DIAGNOSIS — D320 Benign neoplasm of cerebral meninges: Secondary | ICD-10-CM | POA: Diagnosis not present

## 2020-08-10 DIAGNOSIS — D329 Benign neoplasm of meninges, unspecified: Secondary | ICD-10-CM | POA: Diagnosis not present

## 2020-08-10 DIAGNOSIS — Z510 Encounter for antineoplastic radiation therapy: Secondary | ICD-10-CM | POA: Diagnosis not present

## 2020-08-11 DIAGNOSIS — Z510 Encounter for antineoplastic radiation therapy: Secondary | ICD-10-CM | POA: Diagnosis not present

## 2020-08-11 DIAGNOSIS — D320 Benign neoplasm of cerebral meninges: Secondary | ICD-10-CM | POA: Diagnosis not present

## 2020-08-11 DIAGNOSIS — D329 Benign neoplasm of meninges, unspecified: Secondary | ICD-10-CM | POA: Diagnosis not present

## 2020-08-12 DIAGNOSIS — Z510 Encounter for antineoplastic radiation therapy: Secondary | ICD-10-CM | POA: Diagnosis not present

## 2020-08-12 DIAGNOSIS — D320 Benign neoplasm of cerebral meninges: Secondary | ICD-10-CM | POA: Diagnosis not present

## 2020-08-13 ENCOUNTER — Encounter: Payer: Self-pay | Admitting: Internal Medicine

## 2020-08-13 DIAGNOSIS — D320 Benign neoplasm of cerebral meninges: Secondary | ICD-10-CM | POA: Diagnosis not present

## 2020-08-13 DIAGNOSIS — Z510 Encounter for antineoplastic radiation therapy: Secondary | ICD-10-CM | POA: Diagnosis not present

## 2020-08-14 DIAGNOSIS — D320 Benign neoplasm of cerebral meninges: Secondary | ICD-10-CM | POA: Diagnosis not present

## 2020-08-14 DIAGNOSIS — Z510 Encounter for antineoplastic radiation therapy: Secondary | ICD-10-CM | POA: Diagnosis not present

## 2020-08-14 DIAGNOSIS — D329 Benign neoplasm of meninges, unspecified: Secondary | ICD-10-CM | POA: Diagnosis not present

## 2020-08-24 ENCOUNTER — Other Ambulatory Visit: Payer: Self-pay

## 2020-08-24 ENCOUNTER — Ambulatory Visit: Payer: Medicare Other | Attending: Family Medicine | Admitting: Physical Therapy

## 2020-08-24 ENCOUNTER — Encounter: Payer: Self-pay | Admitting: Physical Therapy

## 2020-08-24 DIAGNOSIS — R2681 Unsteadiness on feet: Secondary | ICD-10-CM | POA: Diagnosis not present

## 2020-08-24 DIAGNOSIS — M6281 Muscle weakness (generalized): Secondary | ICD-10-CM | POA: Diagnosis not present

## 2020-08-24 DIAGNOSIS — M25561 Pain in right knee: Secondary | ICD-10-CM | POA: Diagnosis not present

## 2020-08-25 NOTE — Therapy (Signed)
[place] [facility] SERVICES [address] [place], [place], [address] Phone: [phone]   Fax:  [phone]  Physical Therapy Evaluation  Patient Details  Name: Angela Bentley MRN: [id number] Date of Birth: [birth year] Referring Provider (PT): Loura Pardon, MD   Encounter Date: 08/24/2020   PT End of Session - 08/24/20 1246    Visit Number 1    Number of Visits 17    Date for PT Re-Evaluation 10/19/20    PT Start Time 1101    PT Stop Time 1200    PT Time Calculation (min) 59 min    Activity Tolerance Patient tolerated treatment well;No increased pain    Behavior During Therapy WFL for tasks assessed/performed           Past Medical History:  Diagnosis Date  . Balance problems   . Brain tumor ([place])    a. Left intraventricular tumor ->stable by 06/2017 MRI. Followed @ [facility].  . Gait disturbance    a. Unsteady on feet/balance difficulty.  . Generalized osteoarthritis of multiple sites   . GERD (gastroesophageal reflux disease)   . History of DVT (deep vein thrombosis) 2016   estrogen and airflights. Rx with xarelto for 3 months  . History of stress test    a. 11/2016 ETT: No ST/T changes.  Performed to assess PAC/PVC burden with activity ->No ectopy noted.  . Hypertension   . Hyperthyroidism    treated briefly in college  . Insomnia   . Obesity   . PAF (paroxysmal atrial fibrillation) ([place])    a. 12/2016 Event Monitor: brief run of PAF-->CHA2DS2VASc = 5--> Xarelto.  . Plantar fasciitis   . Retinal tear of left eye   . Rosacea   . Squamous cell carcinoma of skin 09/07/2017   R dorsum of hand   . Type 2 diabetes, controlled, with peripheral neuropathy ([place])    a. 11/2016 A1c 7.4.  . Uterine fibroid     Past Surgical History:  Procedure Laterality Date  . CARPAL TUNNEL RELEASE Right    Dr Burney Gauze  . CATARACT EXTRACTION    . 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 RETINAL  TEAR REPAIR CRYOTHERAPY     10/09/13, then again 3/15    There were no vitals filed for this visit.    Subjective Assessment - 08/24/20 1105    Subjective "My left hip is much better but my right knee is still bothering me"    Pertinent History 79 yo Female reports recent new onset of LLE hip pain which started mid October 2021. As a result she is having increased RLE knee pain. She did get X-rays of left hip which shows no arthritis or acute injury. She reports since the hip started hurting it has actually gotten better, but now the right knee is bothering her more and she is having more of a balance issue. She had a gamma knife procedure for Meningioma on 08/14/20; She reports since having the procedure she has noticed shuffling steps and unsteady gait. She has been using tripod base cane due to hip/knee pain and has continued using it for balance/stability; She states side effects from gamma knife procedure is possible brain swelling from 6 months to 3 years after procedure. She reports occasional headaches; She is still having ringing in the ears on left side which is chronic. She also denies any change in vision;    How long can you sit comfortably? NA  How long can you stand comfortably? will having some pain with initial standing, but then as she moves around its okay    How long can you walk comfortably? 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 There are heterogeneous areas of enhancement of the tumor and different lobules. Differential includes primarily an intraventricular meningioma. However with the enlargement consideration is also given for a tumor of the choroid plexus such as choroid plexus  papilloma although less commonly seen in the lateral ventricles in adult patients, and other tumors. The growth appears slow for a cord plexus carcinoma.    Patient Stated Goals "What is going on with the knee, see if I need surgery"    Currently in Pain? Yes    Pain Score 5     Pain Location Knee    Pain Orientation Right    Pain Descriptors / Indicators Sharp    Pain Type Acute pain    Pain Radiating Towards lateral/medial knee    Pain Onset 1 to 4 weeks ago    Pain Frequency Intermittent    Aggravating Factors  worse at night    Pain Relieving Factors sitting alleviates pain    Effect of Pain on Daily Activities decreased sleeping tolerance/activity tolerance;    Multiple Pain Sites No              [facility] PT Assessment - 08/25/20 0001      Assessment   Medical Diagnosis right knee pain    Referring Provider (PT) Loura Pardon, MD    Onset Date/Surgical Date 06/26/20    Hand Dominance Right    Prior Therapy had outpatient PT from March -August 2021 for right knee pain and imbalance with good results      Precautions   Precautions Fall    Required Braces or Orthoses --   hasn't been wearing brace to knee;      Restrictions   Weight Bearing Restrictions No      Balance Screen   Has the patient fallen in the past 6 months No    Has the patient had a decrease in activity level because of a fear of falling?  Yes    Is the patient reluctant to leave their home because of a fear of falling?  No      Home Environment   Additional Comments lives in single story independent living community with husband, mod I for self care ADLs      Prior Function   Vocation Retired    Leisure likes to walk dog and read      Cognition   Overall Cognitive Status Within Functional Limits for tasks assessed      Observation/Other Assessments   Observations slight swelling noted in RLE knee: at superior patella: R: 45 cm, L: 44.25 cm, mid joint: bilaterally 38 cm;     Skin Integrity intact,  slight swelling and       Sensation   Light Touch Appears Intact    Proprioception Appears Intact      Coordination   Gross Motor Movements are Fluid and Coordinated Yes    Fine Motor Movements are Fluid and Coordinated Yes      AROM   Right  Knee Extension -20    Right Knee Flexion 125    Left Knee Extension -5    Left Knee Flexion 11      Strength   Overall Strength Comments RLE grossly 4/5 with pain reported with resisted knee extension      Palpation   Patella mobility increased tenderness along lateral patella, does have slight crepitis with inferior glide, good mobility noted; reports less discomfort with quad set with medial glide      Transfers   Comments requires HHA to push up from chair      Ambulation/Gait   Gait Comments ambulates with tripod base cane in RUE with reciprocal gait pattern, slightly wide base of support, mild antalgic gait on right side      Standardized Balance Assessment   Standardized Balance Assessment 10 meter walk test;Five Times Sit to Stand    10 Meter Walk 0.8 m/s with tripod base cane, limited community ambulator, increased risk for falls                      Objective measurements completed on examination: See above findings.   PT applied Leukotape to right knee with medial glide to help reduce knee discomfort. She reports feeling mild improvement with tape. Educated patient on safe tape wear. Will re-assess efficacy at next appointment  Patient will also need to complete FOTO at next appointment;              PT Education - 08/24/20 1246    Education Details Plan of care/taping    Person(s) Educated Patient    Methods Explanation    Comprehension Verbalized understanding            PT Short Term Goals - 08/25/20 1156      PT SHORT TERM GOAL #1   Title Patient will be adherent to HEP at least 3x a week to improve functional strength and balance for better safety at home.    Time 4    Period Weeks     Status New    Target Date 09/22/20      PT SHORT TERM GOAL #2   Title Patient will increase RLE knee AROM extension to within 5 degrees of neutral for better standing and walking tolerance;     Time 4    Period Weeks    Status New    Target Date 09/22/20      PT SHORT TERM GOAL #3   Title Patient will reports maximum of 1 sleep disturbances per night over last 3 days to exhibit improved sleeping tolerance with less knee pain and reduced fatigue.     Time 4    Period Weeks    Status New    Target Date 09/22/20             PT Long Term Goals - 08/25/20 1157      PT LONG TERM GOAL #1   Title Patient will increase BLE gross strength to 4+/5 as to improve functional strength for independent gait, increased standing tolerance and increased ADL ability.    Time 8    Period Weeks    Status New    Target Date 10/20/20      PT LONG TERM GOAL #2   Title Patient will improve FOTO score to >55/100 to indicate improved functional mobility with ADLs.    Time 8    Period Weeks    Status New    Target Date 10/20/20  PT LONG TERM GOAL #3   Title Patient will report a worst pain of 3/10 on VAS in      right knee       to improve tolerance with ADLs and reduced symptoms with activities.     Time 8    Period Weeks    Status New    Target Date 10/20/20      PT LONG TERM GOAL #4   Title Patient will complete >1000 feet on 6 min walk test without increase in knee pain to improve community ambulator distance for functional tasks and return to PLOF.    Time 8    Period Weeks    Status New    Target Date 10/20/20      PT LONG TERM GOAL #5   Title Patient will increase 10 meter walk test to >1.51m/s as to improve gait speed for better community ambulation and to reduce fall risk.    Time 8    Period Weeks    Status New    Target Date 10/20/20                  Plan - 08/24/20 1247    Clinical Impression Statement 79 yo Female reports increased right knee pain over last  several weeks. She started having left hip pain in [facility] October 2021 and believes her right knee started hurting as a result of compensation with her gait. She reports her left hip pain has since resolved but her right knee pain continues to linger. She has had pain in her right knee in the past and did physical therapy for treatment with good results. Patient does exhibit increased tenderness along right quads/superior patella as well as tenderness and swelling noted on lateral patella. Patient does exhibit increased swelling in right knee compared to left knee. She denies any significant pain with transfers/step negotiation etc. Patient also reports increased unsteadiness. She has been walking with tripod base cane to help with steadying her gait as well as to help alleviate LE discomfort. She recently underwent gamma knife procedure to address meningioma on left side of brain. She reports she will follow up with neurologist in approximately 6 months. She denies any restrictions but states the doctor did want her to self monitor for signs of possible swelling in the brain as that is the biggest side effect from her procedure. Patient reports feeling a little more unsteady with feet shuffling and will sometimes veer to right side when walking. She does ambulate at a slower speed which puts her at an increased risk falls. Patient also exhibits decreased ROM in right knee and weakness on right side. She would benefit from skilled PT Intervention to improve LE strength, balance and mobility.    Personal Factors and Comorbidities Age;Comorbidity 3+;Time since onset of injury/illness/exacerbation    Comorbidities HTN, OA, retinal tear in left eye with impaired vision, ringing in left ear, imbalance, A-fib, DMx2 controlled, insomnia, obesity, left intraventricular meningioma- treated with gamma knife in Nov 2021    Examination-Activity Limitations Locomotion Level;Sleep;Squat;Stairs;Stand;Transfers     Examination-Participation Restrictions Cleaning;Community Activity;Shop;Volunteer;Yard Work    [employer] Evolving/Moderate complexity    Clinical Decision Making Moderate    Rehab Potential Good    PT Frequency 2x / week    PT Duration 8 weeks    PT Treatment/Interventions ADLs/Self Care Home Management;Cryotherapy;Moist Heat;Gait training;Stair training;Functional mobility training;Therapeutic activities;Therapeutic exercise;Balance training;Neuromuscular re-education;Patient/family education;Orthotic Fit/Training;Manual techniques;Passive range of motion;Dry needling;Energy conservation;Taping    PT Next  Visit Plan manual therapy/LE strengthening/ROM    PT Home Exercise Plan will address next session    Consulted and Agree with Plan of Care Patient           Patient will benefit from skilled therapeutic intervention in order to improve the following deficits and impairments:  Abnormal gait, Decreased balance, Decreased endurance, Decreased mobility, Difficulty walking, Obesity, Decreased range of motion, Decreased activity tolerance, Decreased strength, Pain  Visit Diagnosis: Acute pain of right knee  Muscle weakness (generalized)  Unsteadiness on feet     Problem List Patient Active Problem List   Diagnosis Date Noted  . Left hip pain 07/10/2020  . Right knee pain 07/10/2020  . Hoarseness of voice 04/21/2020  . Skin tag 03/18/2020  . Trapezius strain, left, initial encounter 02/21/2020  . Left-sided chest pain 10/23/2019  . Overactive bladder 10/09/2019  . Lumbar sprain 03/28/2019  . Tinnitus, bilateral 11/28/2017  . Abnormal MRI of head 11/14/2017  . Meningioma ([place]) 09/21/2017  . PAF (paroxysmal atrial fibrillation) ([place]) 02/03/2017  . Preventative health care 06/02/2016  . Advance directive discussed with patient 06/02/2016  . Achilles tendonitis 03/11/2016  . Hypertension   . DVT, lower extremity, recurrent ([place])   . Type 2 diabetes,  controlled, with peripheral neuropathy ([place])   . Generalized osteoarthritis of multiple sites   . GERD (gastroesophageal reflux disease)   . Hyperthyroidism   . Retinal hole of right eye 06/26/2014  . Amblyopia, right 05/08/2014  . Status post intraocular lens implant 05/08/2014    Orvile Corona  PT, DPT 08/25/2020, 11:59 AM  [place] [facility]IN [facility] SERVICES [place], [place], [address] Phone: [phone]   Fax:  [phone]  Name: Angela Bentley MRN: [id number] Date of Birth: [birth year]

## 2020-09-01 ENCOUNTER — Telehealth: Payer: Self-pay

## 2020-09-01 ENCOUNTER — Encounter: Payer: Medicare Other | Admitting: Physical Therapy

## 2020-09-01 ENCOUNTER — Other Ambulatory Visit: Payer: Self-pay

## 2020-09-01 ENCOUNTER — Ambulatory Visit: Payer: Medicare Other

## 2020-09-01 DIAGNOSIS — I10 Essential (primary) hypertension: Secondary | ICD-10-CM

## 2020-09-01 DIAGNOSIS — E1142 Type 2 diabetes mellitus with diabetic polyneuropathy: Secondary | ICD-10-CM

## 2020-09-01 NOTE — Chronic Care Management (AMB) (Signed)
Chronic Care Management Pharmacy  Name: Angela Bentley  MRN: [id number] DOB: 12/23/1940  Chief Complaint/ HPI  Angela Bentley,  79 y.o. , female presents for their Follow-Up CCM visit with the clinical pharmacist via telephone.  PCP : [name], MD  Their chronic conditions include: HTN, DVT, AF, GERD, T2DM, hyperthyroidism, OA, OAB  Patient concerns: Denies medication changes in past 6 months or med concerns 09/01/20. Denies checking any vital signs or BG readings recently. Reports she is aware when BG is elevated.   Office Visits:  07/10/20: [facility] - hip pain, xray hip today. Try tylenol walker/cane, and voltaren gel, follow with Dr. Tonita Cong for knee  04/21/20: [name] - Please start the omeprazole daily on an empty stomach. If your hoarseness is not better in 2-4 weeks, you will need an appointment with [name] to look at your larynx.  5/28/21[name] - suspicious mole on back, benign, no treatment needed  10/23/19: [name] - left sided chest pain, KUB and chest xray normal, observe only   10/09/19: [name] - taking Myrbetriq PRN for OAB, may need to increase to daily, try MiraLAX daily for constipation   Consult Visit:  Physical therapy/rehab 3 days per week for since April 2021 due to knee/right meniscus tear and balance - will complete PT in July   03/11/20: Cardiology - cont current meds  Allergies  Allergen Reactions   Penicillins     Has patient had a PCN reaction causing immediate rash, facial/tongue/throat swelling, SOB or lightheadedness with hypotension: Yes Has patient had a PCN reaction causing severe rash involving mucus membranes or skin necrosis: Yes Has patient had a PCN reaction that required hospitalization No Has patient had a PCN reaction occurring within the last 10 years: No If all of the above answers are "NO", then may proceed with Cephalosporin use.     Sulfa Antibiotics     Rash/itching   Azithromycin Palpitations    Medications: Outpatient Encounter Medications as of 09/01/2020  Medication Sig   diclofenac sodium (VOLTAREN) 1 % GEL Apply 4 g topically 4 (four) times daily. (Patient taking differently: Apply 4 g topically 4 (four) times daily as needed. )   fluticasone (VERAMYST) 27.5 MCG/SPRAY nasal spray Place 2 sprays into the nose daily as needed for allergies.    glimepiride (AMARYL) 1 MG tablet Take 1 tablet (1 mg total) by mouth daily with breakfast.   lidocaine (LIDODERM) 5 % Place 1 patch onto the skin daily. Remove & Discard patch within 12 hours or as directed by MD   losartan-hydrochlorothiazide (HYZAAR) 100-12.5 MG tablet Take 1 tablet by mouth daily.   metoprolol succinate (TOPROL-XL) 25 MG 24 hr tablet Take 1 tablet (25 mg total) by mouth daily.   Multiple Vitamin (MULTIVITAMIN) tablet Take 1 tablet by mouth daily.   omeprazole (PRILOSEC) 20 MG capsule Take 1 capsule (20 mg total) by mouth daily.   pioglitazone (ACTOS) 45 MG tablet Take 1 tablet (45 mg total) by mouth daily.   rivaroxaban (XARELTO) 20 MG TABS tablet TAKE 1 TABLET (20 MG TOTAL) BY MOUTH DAILY WITH SUPPER.   triamcinolone ointment (KENALOG) 0.1 % Apply 1 application topically 2 (two) times daily. Avoid applying to face, groin, and axilla. Use as directed. Risk of skin atrophy with long-term use reviewed. (Patient not taking: Reported on 09/01/2020)   [DISCONTINUED] diazepam (VALIUM) 2 MG tablet Take 1 tablet (2 mg total) by mouth 2 (two) times daily as needed for anxiety. (Patient not taking: Reported on  09/01/2020)   [DISCONTINUED] diclofenac Sodium (VOLTAREN) 1 % GEL Apply 4 g topically 4 (four) times daily as needed.   [DISCONTINUED] furosemide (LASIX) 20 MG tablet Take 1 tablet (20 mg total) by mouth daily as needed. (Patient not taking: Reported on 09/01/2020)   [DISCONTINUED] nystatin cream (MYCOSTATIN) Apply 1 application topically 2 (two) times daily. Apply to affected area BID for up to 7 days. (Patient not  taking: Reported on 09/01/2020)   [DISCONTINUED] potassium chloride SA (K-DUR,KLOR-CON) 20 MEQ tablet Take 1 tablet (20 mEq total) by mouth daily as needed. (Patient not taking: Reported on 09/01/2020)   No facility-administered encounter medications on file as of 09/01/2020.   Current Diagnosis/Assessment: [employer]: Low Risk    Difficulty of Paying Living Expenses: Not very hard   Goals Addressed            This Visit's Progress    Pharmacy Care Plan       CARE PLAN ENTRY  Current Barriers:   Chronic Disease Management support, education, and care coordination needs related to Hypertension and Diabetes  Hypertension/Cholesterol/Cardiovascular Risk Assessment:  Pharmacist Clinical Goal(s): o Over the next 6 months, patient will work with PharmD and providers to maintain LDL goal < 100 and achieve triglycerides < 150, HDL > 50; Maintain blood pressure < 140/90 mmHg  Lipid Panel     Component Value Date/Time   CHOL 173 10/09/2019 0910   TRIG 170.0 (H) 10/09/2019 0910   HDL 48.10 10/09/2019 0910   CHOLHDL 4 10/09/2019 0910   VLDL 34.0 10/09/2019 0910   LDLCALC 91 10/09/2019 0910   Office blood pressures are: BP Readings from Last 3 Encounters:  02/21/20 140/76  10/23/19 126/82  10/09/19 140/82   Current regimen:  o No pharmacotherapy for cholesterol o Blood pressure:  Metoprolol succinate 25 mg - 1 tablet daily  Losartan/HCTZ 100-12.5 mg - 1 tablet daily   Interventions: o Assessed cardiovascular risk o CV risk factors include diabetes and hypertension o Recommend A1c goal < 7% in order to lower cardiovascular risk in addition to low dose statin therapy (examples: atorvastatin, rosuvastatin) and heart healthy diet  Patient self care activities - Over the next 6 months, patient will: o Continue heart healthy diet, considering principles of the DASH or mediterranean diet o Consider starting a cholesterol medication to lower risk of heart attack  and stroke  Diabetes  Pharmacist Clinical Goal(s): o Over the next 6 months, patient will work with PharmD and providers to maintain A1c goal <8% (most recent A1c 7.3% 04/21/20)  Current regimen:   Glimepiride 1 mg - 1 tablet daily  Pioglitazone 45 mg - 1 tablet daily   Interventions: o Discussed patient A1c goals and she would like to maintain A1c  o Recommended considering increase in glimepiride to target A1c goal of < 7% to prevent microvascular complications (retinopathy, neuropathy, nephropathy)  Patient self care activities - Over the next 6 months, patient will: o Consider checking blood sugar daily 2 weeks prior to an office visit, document, and provide at future appointment o Contact provider with any episodes of hypoglycemia  Please see past updates related to this goal by clicking on the "Past Updates" button in the selected goal       Hypertension   CMP Latest Ref Rng & Units 10/09/2019 03/24/2019 10/03/2018  Glucose 70 - 99 mg/dL 142(H) 144(H) 152(H)  BUN 6 - 23 mg/dL 16 15 14   Creatinine 0.40 - 1.20 mg/dL 0.79 0.72 0.84  Sodium 135 -  145 mEq/L 138 139 137  Potassium 3.5 - 5.1 mEq/L 4.0 3.4(L) 4.0  Chloride 96 - 112 mEq/L 100 100 99  CO2 19 - 32 mEq/L 30 27 32  Calcium 8.4 - 10.5 mg/dL 9.7 9.3 9.7  Total Protein 6.0 - 8.3 g/dL 6.8 - 7.0  Total Bilirubin 0.2 - 1.2 mg/dL 0.6 - 0.5  Alkaline Phos 39 - 117 U/L 66 - 60  AST 0 - 37 U/L 19 - 19  ALT 0 - 35 U/L 20 - 17   Office blood pressures are: BP Readings from Last 3 Encounters:  07/10/20 124/76  04/21/20 122/68  04/06/20 (!) 150/76   Patient has failed these meds in the past: none  Patient checks BP at home infrequently Patient home BP readings are ranging: none reported  BP goal < 140/90 mmHg Patient is currently controlled on the following medications:   Metoprolol succinate 25 mg - 1 tablet daily  Losartan/HCTZ 100-12.5 mg - 1 tablet daily   Update 09/01/20: BP controlled on metoprolol,  losartan/hctz. Pt has not used Lasix or potassium in > 1 year, removed from profile.  Plan: Continue current medications   Hyperlipidemia   Lipid Panel     Component Value Date/Time   CHOL 173 10/09/2019 0910   TRIG 170.0 (H) 10/09/2019 0910   HDL 48.10 10/09/2019 0910   LDLCALC 91 10/09/2019 0910    LDL goal < 100, TG < 150, HDL > 50 Patient has failed these meds in past: none  Patient is currently controlled (LDL at goal) on the following medications:   No pharmacotherapy  Update 09/01/20: age > 35 thus clinical assessment and risk discussion recommended; pt does not have history of ASCVD; DM and HTN are primary risk factors, recommend more stringent control of DM. Pt prefers conservative approach to treatment.  Plan: Continue control with diet and exercise  AFIB   Lab Results  Component Value Date   CREATININE 0.79 10/09/2019   BUN 16 10/09/2019   GFR 70.00 10/09/2019   GFRNONAA >60 03/24/2019   GFRAA >60 03/24/2019   NA 138 10/09/2019   K 4.0 10/09/2019   CALCIUM 9.7 10/09/2019   CO2 30 10/09/2019    CBC Latest Ref Rng & Units 10/09/2019 03/24/2019 10/03/2018  WBC 4.0 - 10.5 K/uL 9.6 10.0 7.5  Hemoglobin 12.0 - 15.0 g/dL 12.5 12.5 12.8  Hematocrit 36.0 - 46.0 % 37.8 38.1 38.2  Platelets 150.0 - 400.0 K/uL 260.0 260 226.0   Patient is currently rate controlled. Patient has failed these meds in past: none reported Patient is currently controlled on the following medications:   Xarelto 20 mg - 1 tablet daily in the evening with supper  Metoprolol succinate 25 mg - 1 tablet daily with supper  We discussed: denies abnormal bruising or bleeding; CBC stable  Update 09/01/20: All of patients medications were about 25 days late to be refilled last fill. Encourage daily adherence. Coordinate CMA adherence call.  Plan: Continue current medications; CMA to call in January to assess adherence.  Diabetes   Recent Relevant Labs: Lab Results  Component Value Date/Time    HGBA1C 7.3 (A) 04/21/2020 08:44 AM   HGBA1C 7.6 (H) 10/09/2019 09:10 AM   HGBA1C 7.4 (A) 03/28/2019 03:46 PM   HGBA1C 7.4 (H) 10/03/2018 09:30 AM   HGBA1C 6.5 07/28/2015 12:00 AM    Checking BG: Rarely (prefers not to be concerned about BG) Denies hypoglycemia or history of hypoglycemia  Patient has failed these meds in past:  metformin (diarrhea)  A1c goal < 8% (pt preference)  Patient is currently controlled on the following medications:   Glimepiride 1 mg - 1 tablet daily  Pioglitazone 45 mg - 1 tablet daily   Last diabetic eye exam: Lab Results  Component Value Date/Time   HMDIABEYEEXA No Retinopathy 02/21/2020 12:00 AM    Last diabetic foot exam:  Lab Results  Component Value Date/Time   HMDIABFOOTEX done 10/09/2019 12:00 AM    Diet: limits sodium intake due to husband with CHF   Dinner: Salad and ice cream every night  Lunch: (varies) chicken breast, ground lamb, fresh vegetables, potatoes or rice  Breakfast: eggs or shredded wheat Exercise: limited due to poor balance with walking, ongoing for past 3 years since benign brain tumor, currently in PT   Update 09/01/20: A1c improved, continue current therapy. Pt denies interest in medication changes. Prefers less stringent goal A1c.   Plan: Continue current medications  Osteoarthritis    Patient has failed these meds in past: none reported Patient is currently controlled on the following medications:   Celecoxib 200 mg - 1 capsule daily PRN (not taking, but has at home)  Diclofenac gel 1% - Apply QID PRN (uses occasionally for knee pain)  We discussed: denies using anything regularly for pain, takes an occasional Tylenol in addition to diclofenac gel   No update/changes 09/01/20  Plan: Continue current medications   Patient requests: Cough - hydrocodone PRN cough bedtime Uses just short term 3-4 days when she has cough/allergies --> would like refill, [facility]   Medication Management  Misc: Nystatin  cream   OTCs: multivitamin, Ocuvite - alternates vitamins every other day; flonase - 1 spray in each nostril PRN, uses at night for allergies   Pharmacy/Benefits: [facility] [employer]/Mail order  Adherence: Meds are synced, refills timely  Social support: lives at [facility] with husband   Affordability: denies medication cost or accessibility   CCM Follow Up: 6 month telephone (03/02/21 at 1 PM)  [name], PharmD Clinical Pharmacist [facility] at [facility] [phone]

## 2020-09-01 NOTE — Telephone Encounter (Signed)
Patient is requesting a refill on hydrocodone-homatropine cough syrup. Last filled 03/16/20 24 DS. Pt reports using very sparingly, short term 3-4 days when she has a bad cough.   [name], PharmD Clinical Pharmacist [facility] at [facility] [phone]

## 2020-09-02 MED ORDER — HYDROCODONE-HOMATROPINE 5-1.5 MG/5ML PO SYRP
5.0000 mL | ORAL_SOLUTION | Freq: Every evening | ORAL | 0 refills | Status: DC | PRN
Start: 2020-09-02 — End: 2021-01-07

## 2020-09-02 NOTE — Telephone Encounter (Signed)
Please let her know I sent it to [facility] on [address]

## 2020-09-02 NOTE — Telephone Encounter (Signed)
Spoke to pt

## 2020-09-03 ENCOUNTER — Encounter: Payer: Self-pay | Admitting: Physical Therapy

## 2020-09-03 ENCOUNTER — Ambulatory Visit: Payer: Medicare Other | Attending: Family Medicine | Admitting: Physical Therapy

## 2020-09-03 ENCOUNTER — Other Ambulatory Visit: Payer: Self-pay

## 2020-09-03 DIAGNOSIS — R2681 Unsteadiness on feet: Secondary | ICD-10-CM

## 2020-09-03 DIAGNOSIS — M6281 Muscle weakness (generalized): Secondary | ICD-10-CM | POA: Insufficient documentation

## 2020-09-03 DIAGNOSIS — R262 Difficulty in walking, not elsewhere classified: Secondary | ICD-10-CM | POA: Diagnosis not present

## 2020-09-03 DIAGNOSIS — M25561 Pain in right knee: Secondary | ICD-10-CM | POA: Insufficient documentation

## 2020-09-03 NOTE — Therapy (Signed)
[place] [facility] SERVICES [address] [place], [place], [address] Phone: [phone]   Fax:  [phone]  Physical Therapy Treatment  Patient Details  Name: Angela Bentley MRN: [id number] Date of Birth: 07-17-1941 Referring Provider (PT): Loura Pardon, MD   Encounter Date: 09/03/2020   PT End of Session - 09/03/20 1208    Visit Number 2    Number of Visits 17    Date for PT Re-Evaluation 10/19/20    PT Start Time 1102    PT Stop Time 1200    PT Time Calculation (min) 58 min    Activity Tolerance Patient tolerated treatment well;No increased pain    Behavior During Therapy WFL for tasks assessed/performed           Past Medical History:  Diagnosis Date  . Balance problems   . Brain tumor ([place])    a. Left intraventricular tumor ->stable by 06/2017 MRI. Followed @ [facility].  . Gait disturbance    a. Unsteady on feet/balance difficulty.  . Generalized osteoarthritis of multiple sites   . GERD (gastroesophageal reflux disease)   . History of DVT (deep vein thrombosis) 2016   estrogen and airflights. Rx with xarelto for 3 months  . History of stress test    a. 11/2016 ETT: No ST/T changes.  Performed to assess PAC/PVC burden with activity ->No ectopy noted.  . Hypertension   . Hyperthyroidism    treated briefly in college  . Insomnia   . Obesity   . PAF (paroxysmal atrial fibrillation) ([place])    a. 12/2016 Event Monitor: brief run of PAF-->CHA2DS2VASc = 5--> Xarelto.  . Plantar fasciitis   . Retinal tear of left eye   . Rosacea   . Squamous cell carcinoma of skin 09/07/2017   R dorsum of hand   . Type 2 diabetes, controlled, with peripheral neuropathy ([place])    a. 11/2016 A1c 7.4.  . Uterine fibroid     Past Surgical History:  Procedure Laterality Date  . CARPAL TUNNEL RELEASE Right    Dr Burney Gauze  . CATARACT EXTRACTION    . 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 RETINAL  TEAR REPAIR CRYOTHERAPY     10/09/13, then again 3/15    There were no vitals filed for this visit.   Subjective Assessment - 09/03/20 1104    Subjective Patient reports increased right knee pain. She reports increased achiness today and is still having trouble with sleeping. She has a lot of pain when turning over at night. She denies any help with taping last time.    Pertinent History 79 yo Female reports recent new onset of LLE hip pain which started mid October 2021. As a result she is having increased RLE knee pain. She did get X-rays of left hip which shows no arthritis or acute injury. She reports since the hip started hurting it has actually gotten better, but now the right knee is bothering her more and she is having more of a balance issue. She had a gamma knife procedure for Meningioma on 08/14/20; She reports since having the procedure she has noticed shuffling steps and unsteady gait. She has been using tripod base cane due to hip/knee pain and has continued using it for balance/stability; She states side effects from gamma knife procedure is possible brain swelling from 6 months to 3 years after procedure. She reports occasional headaches; She is still having ringing in the ears on  left side which is chronic. She also denies any change in vision;    How long can you sit comfortably? NA    How long can you stand comfortably? will having some pain with initial standing, but then as she moves around its okay    How long can you walk comfortably? 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 There are heterogeneous areas of enhancement of the tumor and different lobules. Differential includes primarily an  intraventricular meningioma. However with the enlargement consideration is also given for a tumor of the choroid plexus such as choroid plexus papilloma although less commonly seen in the lateral ventricles in adult patients, and other tumors. The growth appears slow for a cord plexus carcinoma.    Patient Stated Goals "What is going on with the knee, see if I need surgery"    Currently in Pain? Yes    Pain Score 6     Pain Location Knee    Pain Orientation Right    Pain Descriptors / Indicators Aching;Sore;Tender    Pain Type Acute pain    Pain Onset 1 to 4 weeks ago    Pain Frequency Intermittent    Aggravating Factors  worse at night    Pain Relieving Factors sitting alleviates pain    Effect of Pain on Daily Activities decreased sleeping tolerance/activity tolerance;    Multiple Pain Sites No            TREATMENT:  Patient instructed in FOTO survey to assess functional level;  Patient transitioned to supine over wedge for LE knee support with moist heat to low back for comfort  PT performed extensive soft/deep tissue massage utilizing edge tool for tissue extensibility; utilized massage cream and Biofreeze for topical pain relief. Patient exhibited increased trigger points along lateral quad and distal quad insertion with moderate tenderness. Patient tolerated manual therapy well with improved tissue extensibility and mild reduction in pain. She does report increased right knee joint discomfort with knee extension which she reports feeling along the joint.   Patient transitioned to sitting: PT performed grade II-III PA/AP mobilizations tibia on femur to facilitate better joint mobility 20 sec bouts x3 sets each;  Following joint mobilization patient instructed to extend right knee and she reports while it is still uncomfortable the pain is significantly less than prior to session;  Patient instructed in RW adjustment to improve gait ability as she has a hard time using SPC in LUE  due to being right handed. Recommend patient use RW when she is feeling increased right knee pain to help unload the joint and improve gait safety; Patient verbalized understanding;                          PT Education - 09/03/20 1208    Education Details manual therapy    Person(s) Educated Patient    Methods Explanation;Verbal cues    Comprehension Verbalized understanding;Returned demonstration;Verbal cues required;Need further instruction            PT Short Term Goals - 08/25/20 1156      PT SHORT TERM GOAL #1   Title Patient will be adherent to HEP at least 3x a  week to improve functional strength and balance for better safety at home.    Time 4    Period Weeks    Status New    Target Date 09/22/20      PT SHORT TERM GOAL #2   Title Patient will increase RLE knee AROM extension to within 5 degrees of neutral for better standing and walking tolerance;     Time 4    Period Weeks    Status New    Target Date 09/22/20      PT SHORT TERM GOAL #3   Title Patient will reports maximum of 1 sleep disturbances per night over last 3 days to exhibit improved sleeping tolerance with less knee pain and reduced fatigue.     Time 4    Period Weeks    Status New    Target Date 09/22/20             PT Long Term Goals - 08/25/20 1157      PT LONG TERM GOAL #1   Title Patient will increase BLE gross strength to 4+/5 as to improve functional strength for independent gait, increased standing tolerance and increased ADL ability.    Time 8    Period Weeks    Status New    Target Date 10/20/20      PT LONG TERM GOAL #2   Title Patient will improve FOTO score to >55/100 to indicate improved functional mobility with ADLs.    Time 8    Period Weeks    Status New    Target Date 10/20/20      PT LONG TERM GOAL #3   Title Patient will report a worst pain of 3/10 on VAS in      right knee       to improve tolerance with ADLs and reduced symptoms with activities.      Time 8    Period Weeks    Status New    Target Date 10/20/20      PT LONG TERM GOAL #4   Title Patient will complete >1000 feet on 6 min walk test without increase in knee pain to improve community ambulator distance for functional tasks and return to PLOF.    Time 8    Period Weeks    Status New    Target Date 10/20/20      PT LONG TERM GOAL #5   Title Patient will increase 10 meter walk test to >1.66m/s as to improve gait speed for better community ambulation and to reduce fall risk.    Time 8    Period Weeks    Status New    Target Date 10/20/20                 Plan - 09/03/20 1208    Clinical Impression Statement Patient presents to therapy reporting increased RLE knee pain. She exhibits increased trigger points along right quads and lateral quad insertion. PT performed extensive manual therapy to improve tissue extensibility and to reduce trigger point. Patient does report moderate tenderness but reports less pain after manual therapy. She also complains of joint pain with knee extension. PT performed joint mobilizaations with good tolerance with patient reporting reduction in pain at end of session. She would benefit from additional skilled PT intervention to improve knee ROM, strength and stability to help reduce pain. Also would benefit from balance instruction to improve stance control as her pain reduces.    Personal Factors and Comorbidities Age;Comorbidity  3+;Time since onset of injury/illness/exacerbation    Comorbidities HTN, OA, retinal tear in left eye with impaired vision, ringing in left ear, imbalance, A-fib, DMx2 controlled, insomnia, obesity, left intraventricular meningioma- treated with gamma knife in Nov 2021    Examination-Activity Limitations Locomotion Level;Sleep;Squat;Stairs;Stand;Transfers    Examination-Participation Restrictions Cleaning;Community Activity;Shop;Volunteer;Yard Work    [employer] Evolving/Moderate complexity     Rehab Potential Good    PT Frequency 2x / week    PT Duration 8 weeks    PT Treatment/Interventions ADLs/Self Care Home Management;Cryotherapy;Moist Heat;Gait training;Stair training;Functional mobility training;Therapeutic activities;Therapeutic exercise;Balance training;Neuromuscular re-education;Patient/family education;Orthotic Fit/Training;Manual techniques;Passive range of motion;Dry needling;Energy conservation;Taping    PT Next Visit Plan manual therapy/LE strengthening/ROM    PT Home Exercise Plan will address next session    Consulted and Agree with Plan of Care Patient           Patient will benefit from skilled therapeutic intervention in order to improve the following deficits and impairments:  Abnormal gait,Decreased balance,Decreased endurance,Decreased mobility,Difficulty walking,Obesity,Decreased range of motion,Decreased activity tolerance,Decreased strength,Pain  Visit Diagnosis: Acute pain of right knee  Muscle weakness (generalized)  Unsteadiness on feet     Problem List Patient Active Problem List   Diagnosis Date Noted  . Left hip pain 07/10/2020  . Right knee pain 07/10/2020  . Hoarseness of voice 04/21/2020  . Skin tag 03/18/2020  . Trapezius strain, left, initial encounter 02/21/2020  . Left-sided chest pain 10/23/2019  . Overactive bladder 10/09/2019  . Lumbar sprain 03/28/2019  . Tinnitus, bilateral 11/28/2017  . Abnormal MRI of head 11/14/2017  . Meningioma ([place]) 09/21/2017  . PAF (paroxysmal atrial fibrillation) ([place]) 02/03/2017  . Preventative health care 06/02/2016  . Advance directive discussed with patient 06/02/2016  . Achilles tendonitis 03/11/2016  . Hypertension   . DVT, lower extremity, recurrent ([place])   . Type 2 diabetes, controlled, with peripheral neuropathy ([place])   . Generalized osteoarthritis of multiple sites   . GERD (gastroesophageal reflux disease)   . Hyperthyroidism   . Retinal hole of right eye 06/26/2014  .  Amblyopia, right 05/08/2014  . Status post intraocular lens implant 05/08/2014    Anaya Bovee PT, DPT 09/03/2020, 12:17 PM  [place] [facility]IN [facility] SERVICES [address] [place], [place], [address] Phone: [phone]   Fax:  [phone]  Name: Angela Bentley MRN: [id number] Date of Birth: 22-Jul-1941

## 2020-09-07 ENCOUNTER — Encounter: Payer: Self-pay | Admitting: Physical Therapy

## 2020-09-07 ENCOUNTER — Ambulatory Visit: Payer: Medicare Other | Admitting: Physical Therapy

## 2020-09-07 ENCOUNTER — Other Ambulatory Visit: Payer: Self-pay

## 2020-09-07 DIAGNOSIS — R262 Difficulty in walking, not elsewhere classified: Secondary | ICD-10-CM | POA: Diagnosis not present

## 2020-09-07 DIAGNOSIS — R2681 Unsteadiness on feet: Secondary | ICD-10-CM

## 2020-09-07 DIAGNOSIS — M25561 Pain in right knee: Secondary | ICD-10-CM | POA: Diagnosis not present

## 2020-09-07 DIAGNOSIS — M6281 Muscle weakness (generalized): Secondary | ICD-10-CM | POA: Diagnosis not present

## 2020-09-07 NOTE — Therapy (Signed)
[place] MAIN [facility] SERVICES [address] [place], [place], [address] Phone: [phone]   Fax:  [phone]  Physical Therapy Treatment  Patient Details  Name: Angela Bentley MRN: [id number] Date of Birth: 05/01/1941 Referring Provider (PT): Loura Pardon, MD   Encounter Date: 09/07/2020   PT End of Session - 09/07/20 1102    Visit Number 3    Number of Visits 17    Date for PT Re-Evaluation 10/19/20    PT Start Time 1100    PT Stop Time 1145    PT Time Calculation (min) 45 min    Activity Tolerance Patient tolerated treatment well;No increased pain    Behavior During Therapy WFL for tasks assessed/performed           Past Medical History:  Diagnosis Date  . Balance problems   . Brain tumor ([place])    a. Left intraventricular tumor ->stable by 06/2017 MRI. Followed @ [facility].  . Gait disturbance    a. Unsteady on feet/balance difficulty.  . Generalized osteoarthritis of multiple sites   . GERD (gastroesophageal reflux disease)   . History of DVT (deep vein thrombosis) 2016   estrogen and airflights. Rx with xarelto for 3 months  . History of stress test    a. 11/2016 ETT: No ST/T changes.  Performed to assess PAC/PVC burden with activity ->No ectopy noted.  . Hypertension   . Hyperthyroidism    treated briefly in college  . Insomnia   . Obesity   . PAF (paroxysmal atrial fibrillation) ([place])    a. 12/2016 Event Monitor: brief run of PAF-->CHA2DS2VASc = 5--> Xarelto.  . Plantar fasciitis   . Retinal tear of left eye   . Rosacea   . Squamous cell carcinoma of skin 09/07/2017   R dorsum of hand   . Type 2 diabetes, controlled, with peripheral neuropathy ([place])    a. 11/2016 A1c 7.4.  . Uterine fibroid     Past Surgical History:  Procedure Laterality Date  . CARPAL TUNNEL RELEASE Right    Dr Burney Gauze  . CATARACT EXTRACTION    . 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 RETINAL  TEAR REPAIR CRYOTHERAPY     10/09/13, then again 3/15    There were no vitals filed for this visit.   Subjective Assessment - 09/07/20 1108    Subjective Patient reports her knee was better but then last night it was worse. She reports she was up a lot last night and it was more sore this morning. She reports she isn't sure if she is moving a certain way at night but it feels like it has a "crick"/catch in it. Which she will notice the catch more with weight bearing; She presents to therapy with [facility];    Pertinent History 79 yo Female reports recent new onset of LLE hip pain which started mid October 2021. As a result she is having increased RLE knee pain. She did get X-rays of left hip which shows no arthritis or acute injury. She reports since the hip started hurting it has actually gotten better, but now the right knee is bothering her more and she is having more of a balance issue. She had a gamma knife procedure for Meningioma on 08/14/20; She reports since having the procedure she has noticed shuffling steps and unsteady gait. She has been using tripod base cane due to hip/knee pain and has continued using it for balance/stability; She  states side effects from gamma knife procedure is possible brain swelling from 6 months to 3 years after procedure. She reports occasional headaches; She is still having ringing in the ears on left side which is chronic. She also denies any change in vision;    How long can you sit comfortably? NA    How long can you stand comfortably? will having some pain with initial standing, but then as she moves around its okay    How long can you walk comfortably? NA    Diagnostic tests Denies any imaging of right knee; MRI of brain shows: There is an enlarging intraventricular tumor of the left lateral ventricle inseparable from cord plexus and inseparable from the left occipital lobe and other portions of the ependyma with possible invasion of the left parietal lobe and left  occipital lobe. There are heterogeneous areas of enhancement of the tumor and different lobules. Differential includes primarily an intraventricular meningioma. However with the enlargement consideration is also given for a tumor of the choroid plexus such as choroid plexus papilloma although less commonly seen in the lateral ventricles in adult patients, and other tumors. The growth appears slow for a cord plexus carcinoma.    Patient Stated Goals "What is going on with the knee, see if I need surgery"    Currently in Pain? Yes    Pain Score 6     Pain Location Knee    Pain Orientation Right    Pain Descriptors / Indicators Aching;Sore;Tender    Pain Type Acute pain    Pain Onset 1 to 4 weeks ago    Pain Frequency Intermittent    Aggravating Factors  worse at night    Pain Relieving Factors sitting alleviates the pain    Effect of Pain on Daily Activities decreased sleeping tolerance/activity tolerance;    Multiple Pain Sites No                 TREATMENT: PT assessed RLE Knee: Minimal to no tightness observed in right quad/soft tissue. No trigger points identified; this is an improvement from last session;   Patient reports continued pain along lateral/medial joint line and along superior patella this session.   Instructed patient in [place] test in standing which was (+) for right lateral knee joint pain especially with external rotation; Patient has a PMH for right lateral meniscus tear in March 2020 from previous MRI. She reports her knee pain is worse with transfers and with some weight bearing and describes the pain as a sharp catch especially when changing positions; She continues to have most pain at night having trouble turning over in bed; Recommend patient follow up with orthopedic MD due to concern for possible exacerbation/continued lateral meniscus tear; Patient verbalized understanding;   Patient transitioned to sitting: PT performed grade II-III PA/AP mobilizations  tibia on femur to facilitate better joint mobility 20 sec bouts x3 sets each; PT also performed gentle distraction to right knee tibia on femur 10 sec hold x3 reps;  PT performed passive RLE Knee flexion/extension followed by grade II-III AP mobs tibia on femur with knee extended 10- sec bouts x5 sets total to facilitate better joint mobility and reduce knee discomfort;   Following joint mobilization, patient instructed in standing and walking. She reports reduction in right knee pain during transfer and reports being able to tolerate increased weight bearing on RLE;   Patient tolerated session well. She reports reduction in knee pain at end of session. Educated patient in pros/cons  of knee bracing for support/pain control; Patient verbalized understanding;                      PT Education - 09/07/20 1102    Education Details manual therapy/strengthening/ROM    Person(s) Educated Patient    Methods Explanation;Verbal cues    Comprehension Verbalized understanding;Returned demonstration;Verbal cues required;Need further instruction            PT Short Term Goals - 08/25/20 1156      PT SHORT TERM GOAL #1   Title Patient will be adherent to HEP at least 3x a week to improve functional strength and balance for better safety at home.    Time 4    Period Weeks    Status New    Target Date 09/22/20      PT SHORT TERM GOAL #2   Title Patient will increase RLE knee AROM extension to within 5 degrees of neutral for better standing and walking tolerance;     Time 4    Period Weeks    Status New    Target Date 09/22/20      PT SHORT TERM GOAL #3   Title Patient will reports maximum of 1 sleep disturbances per night over last 3 days to exhibit improved sleeping tolerance with less knee pain and reduced fatigue.     Time 4    Period Weeks    Status New    Target Date 09/22/20             PT Long Term Goals - 08/25/20 1157      PT LONG TERM GOAL #1   Title  Patient will increase BLE gross strength to 4+/5 as to improve functional strength for independent gait, increased standing tolerance and increased ADL ability.    Time 8    Period Weeks    Status New    Target Date 10/20/20      PT LONG TERM GOAL #2   Title Patient will improve FOTO score to >55/100 to indicate improved functional mobility with ADLs.    Time 8    Period Weeks    Status New    Target Date 10/20/20      PT LONG TERM GOAL #3   Title Patient will report a worst pain of 3/10 on VAS in      right knee       to improve tolerance with ADLs and reduced symptoms with activities.     Time 8    Period Weeks    Status New    Target Date 10/20/20      PT LONG TERM GOAL #4   Title Patient will complete >1000 feet on 6 min walk test without increase in knee pain to improve community ambulator distance for functional tasks and return to PLOF.    Time 8    Period Weeks    Status New    Target Date 10/20/20      PT LONG TERM GOAL #5   Title Patient will increase 10 meter walk test to >1.72m/s as to improve gait speed for better community ambulation and to reduce fall risk.    Time 8    Period Weeks    Status New    Target Date 10/20/20                 Plan - 09/08/20 2778    Clinical Impression Statement Patient motivated and participated well within session. She  continues to have increased RLE knee pain which does vary in location/duration and severity. Patient exhibits less tightness and less trigger points in right quad muscle which is an improvement. She does report increased tenderness along superior patella as well as lateral and medial knee joint. PT instructed patient in [place] test which was positive for lateral right knee pain. She has a PMH significant for previous lateral meniscus tear as evidenced on scan in March 2020.Its possible she is experiencing an exacerbation of knee pain as a result of previous injury. Patient tolerated joint mobilizations well  reporting less knee pain and better weight bearing tolerance. She would benefit from skilled PT Intervention to improve knee ROM and stabilization exercise. Recommend patient consider following up with orthopedic MD for worsening right knee pain. Also limited quad/terminal knee extension exercise as that aggravates her right patellofemoral pain.    Personal Factors and Comorbidities Age;Comorbidity 3+;Time since onset of injury/illness/exacerbation    Comorbidities HTN, OA, retinal tear in left eye with impaired vision, ringing in left ear, imbalance, A-fib, DMx2 controlled, insomnia, obesity, left intraventricular meningioma- treated with gamma knife in Nov 2021    Examination-Activity Limitations Locomotion Level;Sleep;Squat;Stairs;Stand;Transfers    Examination-Participation Restrictions Cleaning;Community Activity;Shop;Volunteer;Yard Work    [employer] Evolving/Moderate complexity    Rehab Potential Good    PT Frequency 2x / week    PT Duration 8 weeks    PT Treatment/Interventions ADLs/Self Care Home Management;Cryotherapy;Moist Heat;Gait training;Stair training;Functional mobility training;Therapeutic activities;Therapeutic exercise;Balance training;Neuromuscular re-education;Patient/family education;Orthotic Fit/Training;Manual techniques;Passive range of motion;Dry needling;Energy conservation;Taping    PT Next Visit Plan manual therapy/LE strengthening/ROM    PT Home Exercise Plan will address next session    Consulted and Agree with Plan of Care Patient           Patient will benefit from skilled therapeutic intervention in order to improve the following deficits and impairments:  Abnormal gait,Decreased balance,Decreased endurance,Decreased mobility,Difficulty walking,Obesity,Decreased range of motion,Decreased activity tolerance,Decreased strength,Pain  Visit Diagnosis: Acute pain of right knee  Muscle weakness (generalized)  Unsteadiness on  feet     Problem List Patient Active Problem List   Diagnosis Date Noted  . Left hip pain 07/10/2020  . Right knee pain 07/10/2020  . Hoarseness of voice 04/21/2020  . Skin tag 03/18/2020  . Trapezius strain, left, initial encounter 02/21/2020  . Left-sided chest pain 10/23/2019  . Overactive bladder 10/09/2019  . Lumbar sprain 03/28/2019  . Tinnitus, bilateral 11/28/2017  . Abnormal MRI of head 11/14/2017  . Meningioma ([place]) 09/21/2017  . PAF (paroxysmal atrial fibrillation) ([place]) 02/03/2017  . Preventative health care 06/02/2016  . Advance directive discussed with patient 06/02/2016  . Achilles tendonitis 03/11/2016  . Hypertension   . DVT, lower extremity, recurrent ([place])   . Type 2 diabetes, controlled, with peripheral neuropathy ([place])   . Generalized osteoarthritis of multiple sites   . GERD (gastroesophageal reflux disease)   . Hyperthyroidism   . Retinal hole of right eye 06/26/2014  . Amblyopia, right 05/08/2014  . Status post intraocular lens implant 05/08/2014    Zamira Hickam PT, DPT 09/08/2020, 9:17 AM  [place] MAIN [facility] SERVICES [place], [place], [address] Phone: [phone]   Fax:  [phone]  Name: Angela Bentley MRN: [id number] Date of Birth: 1941-02-06

## 2020-09-09 ENCOUNTER — Ambulatory Visit: Payer: Medicare Other | Admitting: Physical Therapy

## 2020-09-09 ENCOUNTER — Other Ambulatory Visit: Payer: Self-pay

## 2020-09-09 ENCOUNTER — Encounter: Payer: Self-pay | Admitting: Physical Therapy

## 2020-09-09 DIAGNOSIS — M6281 Muscle weakness (generalized): Secondary | ICD-10-CM

## 2020-09-09 DIAGNOSIS — R262 Difficulty in walking, not elsewhere classified: Secondary | ICD-10-CM | POA: Diagnosis not present

## 2020-09-09 DIAGNOSIS — M25561 Pain in right knee: Secondary | ICD-10-CM

## 2020-09-09 DIAGNOSIS — R2681 Unsteadiness on feet: Secondary | ICD-10-CM

## 2020-09-09 NOTE — Therapy (Signed)
[place] MAIN [facility] SERVICES [address] [place], [place], [address] Phone: [phone]   Fax:  [phone]  Physical Therapy Treatment  Patient Details  Name: Angela Bentley MRN: [id number] Date of Birth: 1941/08/19 Referring Provider (PT): Loura Pardon, MD   Encounter Date: 09/09/2020   PT End of Session - 09/09/20 1101    Visit Number 4    Number of Visits 17    Date for PT Re-Evaluation 10/19/20    PT Start Time 1101    PT Stop Time 1145    PT Time Calculation (min) 44 min    Activity Tolerance Patient tolerated treatment well;No increased pain    Behavior During Therapy WFL for tasks assessed/performed           Past Medical History:  Diagnosis Date  . Balance problems   . Brain tumor ([place])    a. Left intraventricular tumor ->stable by 06/2017 MRI. Followed @ [facility].  . Gait disturbance    a. Unsteady on feet/balance difficulty.  . Generalized osteoarthritis of multiple sites   . GERD (gastroesophageal reflux disease)   . History of DVT (deep vein thrombosis) 2016   estrogen and airflights. Rx with xarelto for 3 months  . History of stress test    a. 11/2016 ETT: No ST/T changes.  Performed to assess PAC/PVC burden with activity ->No ectopy noted.  . Hypertension   . Hyperthyroidism    treated briefly in college  . Insomnia   . Obesity   . PAF (paroxysmal atrial fibrillation) ([place])    a. 12/2016 Event Monitor: brief run of PAF-->CHA2DS2VASc = 5--> Xarelto.  . Plantar fasciitis   . Retinal tear of left eye   . Rosacea   . Squamous cell carcinoma of skin 09/07/2017   R dorsum of hand   . Type 2 diabetes, controlled, with peripheral neuropathy ([place])    a. 11/2016 A1c 7.4.  . Uterine fibroid     Past Surgical History:  Procedure Laterality Date  . CARPAL TUNNEL RELEASE Right    Dr Burney Gauze  . CATARACT EXTRACTION    . 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 RETINAL  TEAR REPAIR CRYOTHERAPY     10/09/13, then again 3/15    There were no vitals filed for this visit.   Subjective Assessment - 09/09/20 1108    Subjective Patient reports difficulty sleeping last night with intermittent right knee pain;    Pertinent History 79 yo Female reports recent new onset of LLE hip pain which started mid October 2021. As a result she is having increased RLE knee pain. She did get X-rays of left hip which shows no arthritis or acute injury. She reports since the hip started hurting it has actually gotten better, but now the right knee is bothering her more and she is having more of a balance issue. She had a gamma knife procedure for Meningioma on 08/14/20; She reports since having the procedure she has noticed shuffling steps and unsteady gait. She has been using tripod base cane due to hip/knee pain and has continued using it for balance/stability; She states side effects from gamma knife procedure is possible brain swelling from 6 months to 3 years after procedure. She reports occasional headaches; She is still having ringing in the ears on left side which is chronic. She also denies any change in vision;    How long can you sit comfortably? NA    How  long can you stand comfortably? will having some pain with initial standing, but then as she moves around its okay    How long can you walk comfortably? 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 There are heterogeneous areas of enhancement of the tumor and different lobules. Differential includes primarily an intraventricular meningioma. However with the enlargement consideration is also given for a tumor of the choroid plexus such as choroid  plexus papilloma although less commonly seen in the lateral ventricles in adult patients, and other tumors. The growth appears slow for a cord plexus carcinoma.    Patient Stated Goals "What is going on with the knee, see if I need surgery"    Currently in Pain? Yes    Pain Score 5     Pain Location Knee    Pain Orientation Right    Pain Descriptors / Indicators Aching;Sore;Tender    Pain Type Acute pain    Pain Onset 1 to 4 weeks ago    Pain Frequency Intermittent    Aggravating Factors  worse at night    Pain Relieving Factors sitting alleviates the pain    Effect of Pain on Daily Activities decreased sleeping tolerance/activity tolerance;    Multiple Pain Sites No                   TREATMENT: PT assessed RLE Knee: Minimal to no tightness observed in right quad/soft tissue. No trigger points identified; this is an improvement from last session;   Patient reports continued pain along lateral/medial joint line and along superior patella this session.   Patient transitioned to sitting: PT performed grade II-III PA/AP mobilizations tibia on femur to facilitate better joint mobility 20 sec bouts x5 sets each; PT also performed gentle distraction to right knee tibia on femur 10 sec hold x3 reps;  PT performed passive RLE Knee flexion/extension followed by grade II-III AP mobs tibia on femur with knee extended 10- sec bouts x5 sets total to facilitate better joint mobility and reduce knee discomfort;  PT performed gentle patellofemoral joint mobilization medial/lateral x2 min with no discomfort reported; She does report tenderness along lateral inferior patella with superior/medial mobilization with slight clicking noted;  Seated: RLE hamstring curl green tband 2x15 with min VCs for proper positioning;   Attempted Leg press machine, unable to tolerate with increased right knee pain;  Forward/backward step ups, leading with LLE, eccentric lower with RLE (leading with LLE) x10  reps with rail assist;  Standing with RLE on 6 inch step: eccentric lower with LLE heel down to floor x5 reps with BUE rail assist  Standing on firm surface mini squat x10 reps with BUE rail assist and cues for posterior hip position for better squat positioning;   Patient tolerated session well. She reports reduction in knee pain at end of session. Patient instructed in advanced strengthening exercise. She does require min VCs for proper positioning and exercise technique. Patient does report increased knee pain with weight bearing tasks limiting exercise tolerance; She reports that she will contact her orthopedic doctor for a follow up consult.                     PT Education -  09/09/20 1101    Education Details manual therapy/strengthening, ROM    Person(s) Educated Patient    Methods Explanation;Verbal cues    Comprehension Verbalized understanding;Returned demonstration;Verbal cues required;Need further instruction            PT Short Term Goals - 08/25/20 1156      PT SHORT TERM GOAL #1   Title Patient will be adherent to HEP at least 3x a week to improve functional strength and balance for better safety at home.    Time 4    Period Weeks    Status New    Target Date 09/22/20      PT SHORT TERM GOAL #2   Title Patient will increase RLE knee AROM extension to within 5 degrees of neutral for better standing and walking tolerance;     Time 4    Period Weeks    Status New    Target Date 09/22/20      PT SHORT TERM GOAL #3   Title Patient will reports maximum of 1 sleep disturbances per night over last 3 days to exhibit improved sleeping tolerance with less knee pain and reduced fatigue.     Time 4    Period Weeks    Status New    Target Date 09/22/20             PT Long Term Goals - 08/25/20 1157      PT LONG TERM GOAL #1   Title Patient will increase BLE gross strength to 4+/5 as to improve functional strength for independent gait, increased  standing tolerance and increased ADL ability.    Time 8    Period Weeks    Status New    Target Date 10/20/20      PT LONG TERM GOAL #2   Title Patient will improve FOTO score to >55/100 to indicate improved functional mobility with ADLs.    Time 8    Period Weeks    Status New    Target Date 10/20/20      PT LONG TERM GOAL #3   Title Patient will report a worst pain of 3/10 on VAS in      right knee       to improve tolerance with ADLs and reduced symptoms with activities.     Time 8    Period Weeks    Status New    Target Date 10/20/20      PT LONG TERM GOAL #4   Title Patient will complete >1000 feet on 6 min walk test without increase in knee pain to improve community ambulator distance for functional tasks and return to PLOF.    Time 8    Period Weeks    Status New    Target Date 10/20/20      PT LONG TERM GOAL #5   Title Patient will increase 10 meter walk test to >1.27m/s as to improve gait speed for better community ambulation and to reduce fall risk.    Time 8    Period Weeks    Status New    Target Date 10/20/20                 Plan - 09/10/20 1013    Clinical Impression Statement Patient motivated and participated well within session. She tolerates joint mobilization well with reduction in knee pain upon standing following manual therapy. Patient was instructed in advanced LE strengthening exercise. She does require min VCS for proper positioning and exercise  technique. She continues to have pain in RLE with weight bearing activities. Instructed patient in eccentric strengthening on RLE for better tolerance. Patient would benefit from additional skilled PT intervention to improve strength and mobility and reduce knee pain with ADLs.    Personal Factors and Comorbidities Age;Comorbidity 3+;Time since onset of injury/illness/exacerbation    Comorbidities HTN, OA, retinal tear in left eye with impaired vision, ringing in left ear, imbalance, A-fib, DMx2  controlled, insomnia, obesity, left intraventricular meningioma- treated with gamma knife in Nov 2021    Examination-Activity Limitations Locomotion Level;Sleep;Squat;Stairs;Stand;Transfers    Examination-Participation Restrictions Cleaning;Community Activity;Shop;Volunteer;Yard Work    [employer] Evolving/Moderate complexity    Rehab Potential Good    PT Frequency 2x / week    PT Duration 8 weeks    PT Treatment/Interventions ADLs/Self Care Home Management;Cryotherapy;Moist Heat;Gait training;Stair training;Functional mobility training;Therapeutic activities;Therapeutic exercise;Balance training;Neuromuscular re-education;Patient/family education;Orthotic Fit/Training;Manual techniques;Passive range of motion;Dry needling;Energy conservation;Taping    PT Next Visit Plan manual therapy/LE strengthening/ROM    PT Home Exercise Plan will address next session    Consulted and Agree with Plan of Care Patient           Patient will benefit from skilled therapeutic intervention in order to improve the following deficits and impairments:  Abnormal gait,Decreased balance,Decreased endurance,Decreased mobility,Difficulty walking,Obesity,Decreased range of motion,Decreased activity tolerance,Decreased strength,Pain  Visit Diagnosis: Acute pain of right knee  Muscle weakness (generalized)  Unsteadiness on feet     Problem List Patient Active Problem List   Diagnosis Date Noted  . Left hip pain 07/10/2020  . Right knee pain 07/10/2020  . Hoarseness of voice 04/21/2020  . Skin tag 03/18/2020  . Trapezius strain, left, initial encounter 02/21/2020  . Left-sided chest pain 10/23/2019  . Overactive bladder 10/09/2019  . Lumbar sprain 03/28/2019  . Tinnitus, bilateral 11/28/2017  . Abnormal MRI of head 11/14/2017  . Meningioma ([place]) 09/21/2017  . PAF (paroxysmal atrial fibrillation) ([place]) 02/03/2017  . Preventative health care 06/02/2016  . Advance directive  discussed with patient 06/02/2016  . Achilles tendonitis 03/11/2016  . Hypertension   . DVT, lower extremity, recurrent ([place])   . Type 2 diabetes, controlled, with peripheral neuropathy ([place])   . Generalized osteoarthritis of multiple sites   . GERD (gastroesophageal reflux disease)   . Hyperthyroidism   . Retinal hole of right eye 06/26/2014  . Amblyopia, right 05/08/2014  . Status post intraocular lens implant 05/08/2014    Oluchi Pucci PT, DPT 09/10/2020, 10:20 AM  [place] MAIN [facility] SERVICES [place], [place], [address] Phone: [phone]   Fax:  [phone]  Name: Angela Bentley MRN: [id number] Date of Birth: 09/14/1941

## 2020-09-10 NOTE — Patient Instructions (Addendum)
Dear Angela Bentley,  Below is a summary of the goals we discussed during our follow up appointment on September 10, 2020. Please contact me anytime with questions or concerns.   Visit Information  Goals Addressed            This Visit's Progress   . Pharmacy Care Plan       CARE PLAN ENTRY  Current Barriers:  . Chronic Disease Management support, education, and care coordination needs related to Hypertension and Diabetes  Hypertension/Cholesterol/Cardiovascular Risk Assessment: . Pharmacist Clinical Goal(s): o Over the next 6 months, patient will work with PharmD and providers to maintain LDL goal < 100 and achieve triglycerides < 150, HDL > 50; Maintain blood pressure < 140/90 mmHg  Lipid Panel     Component Value Date/Time   CHOL 173 10/09/2019 0910   TRIG 170.0 (H) 10/09/2019 0910   HDL 48.10 10/09/2019 0910   CHOLHDL 4 10/09/2019 0910   VLDL 34.0 10/09/2019 0910   LDLCALC 91 10/09/2019 0910   Office blood pressures are: BP Readings from Last 3 Encounters:  02/21/20 140/76  10/23/19 126/82  10/09/19 140/82   Current regimen:  o No pharmacotherapy for cholesterol o Blood pressure:  Metoprolol succinate 25 mg - 1 tablet daily  Losartan/HCTZ 100-12.5 mg - 1 tablet daily  . Interventions: o Assessed cardiovascular risk o CV risk factors include diabetes and hypertension o Recommend A1c goal < 7% in order to lower cardiovascular risk in addition to low dose statin therapy (examples: atorvastatin, rosuvastatin) and heart healthy diet . Patient self care activities - Over the next 6 months, patient will: o Continue heart healthy diet, considering principles of the DASH or mediterranean diet o Consider starting a cholesterol medication to lower risk of heart attack and stroke  Diabetes . Pharmacist Clinical Goal(s): o Over the next 6 months, patient will work with PharmD and providers to maintain A1c goal <8% (most recent A1c 7.3% 04/21/20) . Current regimen:    Glimepiride 1 mg - 1 tablet daily  Pioglitazone 45 mg - 1 tablet daily  . Interventions: o Discussed patient A1c goals and she would like to maintain A1c  o Recommended considering increase in glimepiride to target A1c goal of < 7% to prevent microvascular complications (retinopathy, neuropathy, nephropathy) . Patient self care activities - Over the next 6 months, patient will: o Consider checking blood sugar daily 2 weeks prior to an office visit, document, and provide at future appointment o Contact provider with any episodes of hypoglycemia  Please see past updates related to this goal by clicking on the "Past Updates" button in the selected goal        The patient verbalized understanding of instructions, educational materials, and care plan provided today and declined offer to receive copy of patient instructions, educational materials, and care plan.   Telephone follow up appointment with pharmacy team member scheduled for: 03/02/2021 at 1 PM   [name], PharmD Clinical Pharmacist [facility] [phone]   [place] stands for "Dietary Approaches to Stop Hypertension." The DASH eating plan is a healthy eating plan that has been shown to reduce high blood pressure (hypertension). It may also reduce your risk for type 2 diabetes, heart disease, and stroke. The DASH eating plan may also help with weight loss. What are tips for following this plan?  General guidelines  Avoid eating more than 2,300 mg (milligrams) of salt (sodium) a day. If you have hypertension, you may  need to reduce your sodium intake to 1,500 mg a day.  Limit alcohol intake to no more than 1 drink a day for nonpregnant women and 2 drinks a day for men. One drink equals 12 oz of beer, 5 oz of wine, or 1 oz of hard liquor.  Work with your health care provider to maintain a healthy body weight or to lose weight. Ask what an ideal weight is for you.  Get at least 30  minutes of exercise that causes your heart to beat faster (aerobic exercise) most days of the week. Activities may include walking, swimming, or biking.  Work with your health care provider or diet and nutrition specialist (dietitian) to adjust your eating plan to your individual calorie needs. Reading food labels   Check food labels for the amount of sodium per serving. Choose foods with less than 5 percent of the Daily Value of sodium. Generally, foods with less than 300 mg of sodium per serving fit into this eating plan.  To find whole grains, look for the word "whole" as the first word in the ingredient list. Shopping  Buy products labeled as "low-sodium" or "no salt added."  Buy fresh foods. Avoid canned foods and premade or frozen meals. Cooking  Avoid adding salt when cooking. Use salt-free seasonings or herbs instead of table salt or sea salt. Check with your health care provider or pharmacist before using salt substitutes.  Do not fry foods. Cook foods using healthy methods such as baking, boiling, grilling, and broiling instead.  Cook with heart-healthy oils, such as olive, canola, soybean, or sunflower oil. Meal planning  Eat a balanced diet that includes: ? 5 or more servings of fruits and vegetables each day. At each meal, try to fill half of your plate with fruits and vegetables. ? Up to 6-8 servings of whole grains each day. ? Less than 6 oz of lean meat, poultry, or fish each day. A 3-oz serving of meat is about the same size as a deck of cards. One egg equals 1 oz. ? 2 servings of low-fat dairy each day. ? A serving of nuts, seeds, or beans 5 times each week. ? Heart-healthy fats. Healthy fats called Omega-3 fatty acids are found in foods such as flaxseeds and coldwater fish, like sardines, salmon, and mackerel.  Limit how much you eat of the following: ? Canned or prepackaged foods. ? Food that is high in trans fat, such as fried foods. ? Food that is high in  saturated fat, such as fatty meat. ? Sweets, desserts, sugary drinks, and other foods with added sugar. ? Full-fat dairy products.  Do not salt foods before eating.  Try to eat at least 2 vegetarian meals each week.  Eat more home-cooked food and less restaurant, buffet, and fast food.  When eating at a restaurant, ask that your food be prepared with less salt or no salt, if possible. What foods are recommended? The items listed may not be a complete list. Talk with your dietitian about what dietary choices are best for you. Grains Whole-grain or whole-wheat bread. Whole-grain or whole-wheat pasta. Brown rice. [place]. Bulgur. Whole-grain and low-sodium cereals. Pita bread. Low-fat, low-sodium crackers. Whole-wheat flour tortillas. Vegetables Fresh or frozen vegetables (raw, steamed, roasted, or grilled). Low-sodium or reduced-sodium tomato and vegetable juice. Low-sodium or reduced-sodium tomato sauce and tomato paste. Low-sodium or reduced-sodium canned vegetables. Fruits All fresh, dried, or frozen fruit. Canned fruit in natural juice (without added sugar). Meat and other protein  foods Skinless chicken or [place]. Ground chicken or [place]. Pork with fat trimmed off. Fish and seafood. Egg whites. Dried beans, peas, or lentils. Unsalted nuts, nut butters, and seeds. Unsalted canned beans. Lean cuts of beef with fat trimmed off. Low-sodium, lean deli meat. Dairy Low-fat (1%) or fat-free (skim) milk. Fat-free, low-fat, or reduced-fat cheeses. Nonfat, low-sodium ricotta or cottage cheese. Low-fat or nonfat yogurt. Low-fat, low-sodium cheese. Fats and oils Soft margarine without trans fats. Vegetable oil. Low-fat, reduced-fat, or light mayonnaise and salad dressings (reduced-sodium). Canola, safflower, olive, soybean, and sunflower oils. Avocado. Seasoning and other foods Herbs. Spices. Seasoning mixes without salt. Unsalted popcorn and pretzels. Fat-free sweets. What foods are not  recommended? The items listed may not be a complete list. Talk with your dietitian about what dietary choices are best for you. Grains Baked goods made with fat, such as croissants, muffins, or some breads. Dry pasta or rice meal packs. Vegetables Creamed or fried vegetables. Vegetables in a cheese sauce. Regular canned vegetables (not low-sodium or reduced-sodium). Regular canned tomato sauce and paste (not low-sodium or reduced-sodium). Regular tomato and vegetable juice (not low-sodium or reduced-sodium). Angie Fava. Olives. Fruits Canned fruit in a light or heavy syrup. Fried fruit. Fruit in cream or butter sauce. Meat and other protein foods Fatty cuts of meat. Ribs. Fried meat. Berniece Salines. Sausage. Bologna and other processed lunch meats. Salami. Fatback. Hotdogs. Bratwurst. Salted nuts and seeds. Canned beans with added salt. Canned or smoked fish. Whole eggs or egg yolks. Chicken or [place] with skin. Dairy Whole or 2% milk, cream, and half-and-half. Whole or full-fat cream cheese. Whole-fat or sweetened yogurt. Full-fat cheese. Nondairy creamers. Whipped toppings. Processed cheese and cheese spreads. Fats and oils Butter. Stick margarine. Lard. Shortening. Ghee. Bacon fat. Tropical oils, such as coconut, palm kernel, or palm oil. Seasoning and other foods Salted popcorn and pretzels. Onion salt, garlic salt, seasoned salt, table salt, and sea salt. Worcestershire sauce. Tartar sauce. Barbecue sauce. Teriyaki sauce. Soy sauce, including reduced-sodium. Steak sauce. Canned and packaged gravies. Fish sauce. Oyster sauce. Cocktail sauce. Horseradish that you find on the shelf. Ketchup. Mustard. Meat flavorings and tenderizers. Bouillon cubes. Hot sauce and Tabasco sauce. Premade or packaged marinades. Premade or packaged taco seasonings. Relishes. Regular salad dressings. Where to find more information:  National Heart, Lung, and [place]: https://wilson-eaton.com/  American Heart Association:  www.heart.org Summary  The DASH eating plan is a healthy eating plan that has been shown to reduce high blood pressure (hypertension). It may also reduce your risk for type 2 diabetes, heart disease, and stroke.  With the DASH eating plan, you should limit salt (sodium) intake to 2,300 mg a day. If you have hypertension, you may need to reduce your sodium intake to 1,500 mg a day.  When on the DASH eating plan, aim to eat more fresh fruits and vegetables, whole grains, lean proteins, low-fat dairy, and heart-healthy fats.  Work with your health care provider or diet and nutrition specialist (dietitian) to adjust your eating plan to your individual calorie needs. This information is not intended to replace advice given to you by your health care provider. Make sure you discuss any questions you have with your health care provider. Document Revised: 08/25/2017 Document Reviewed: 09/05/2016 Elsevier Patient Education  2020 [employer].

## 2020-09-14 ENCOUNTER — Other Ambulatory Visit: Payer: Self-pay

## 2020-09-14 ENCOUNTER — Ambulatory Visit: Payer: Medicare Other | Admitting: Physical Therapy

## 2020-09-14 ENCOUNTER — Encounter: Payer: Self-pay | Admitting: Physical Therapy

## 2020-09-14 DIAGNOSIS — R2681 Unsteadiness on feet: Secondary | ICD-10-CM

## 2020-09-14 DIAGNOSIS — M6281 Muscle weakness (generalized): Secondary | ICD-10-CM | POA: Diagnosis not present

## 2020-09-14 DIAGNOSIS — R262 Difficulty in walking, not elsewhere classified: Secondary | ICD-10-CM

## 2020-09-14 DIAGNOSIS — M25561 Pain in right knee: Secondary | ICD-10-CM | POA: Diagnosis not present

## 2020-09-14 NOTE — Therapy (Signed)
[place] MAIN [facility] SERVICES [address] [place], [place], [address] Phone: [phone]   Fax:  [phone]  Physical Therapy Treatment  Patient Details  Name: Angela Bentley MRN: [id number] Date of Birth: 1941/01/25 Referring Provider (PT): Loura Pardon, MD   Encounter Date: 09/14/2020   PT End of Session - 09/14/20 1108    Visit Number 5    Number of Visits 17    Date for PT Re-Evaluation 10/19/20    PT Start Time 1102    PT Stop Time 1145    PT Time Calculation (min) 43 min    Activity Tolerance Patient tolerated treatment well;No increased pain    Behavior During Therapy WFL for tasks assessed/performed           Past Medical History:  Diagnosis Date  . Balance problems   . Brain tumor ([place])    a. Left intraventricular tumor ->stable by 06/2017 MRI. Followed @ [facility].  . Gait disturbance    a. Unsteady on feet/balance difficulty.  . Generalized osteoarthritis of multiple sites   . GERD (gastroesophageal reflux disease)   . History of DVT (deep vein thrombosis) 2016   estrogen and airflights. Rx with xarelto for 3 months  . History of stress test    a. 11/2016 ETT: No ST/T changes.  Performed to assess PAC/PVC burden with activity ->No ectopy noted.  . Hypertension   . Hyperthyroidism    treated briefly in college  . Insomnia   . Obesity   . PAF (paroxysmal atrial fibrillation) ([place])    a. 12/2016 Event Monitor: brief run of PAF-->CHA2DS2VASc = 5--> Xarelto.  . Plantar fasciitis   . Retinal tear of left eye   . Rosacea   . Squamous cell carcinoma of skin 09/07/2017   R dorsum of hand   . Type 2 diabetes, controlled, with peripheral neuropathy ([place])    a. 11/2016 A1c 7.4.  . Uterine fibroid     Past Surgical History:  Procedure Laterality Date  . CARPAL TUNNEL RELEASE Right    Dr Burney Gauze  . CATARACT EXTRACTION    . 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 RETINAL  TEAR REPAIR CRYOTHERAPY     10/09/13, then again 3/15    There were no vitals filed for this visit.   Subjective Assessment - 09/14/20 1107    Subjective Patient reports still waking up at night with increased right knee pain having difficulty sleeping.    Pertinent History 79 yo Female reports recent new onset of LLE hip pain which started mid October 2021. As a result she is having increased RLE knee pain. She did get X-rays of left hip which shows no arthritis or acute injury. She reports since the hip started hurting it has actually gotten better, but now the right knee is bothering her more and she is having more of a balance issue. She had a gamma knife procedure for Meningioma on 08/14/20; She reports since having the procedure she has noticed shuffling steps and unsteady gait. She has been using tripod base cane due to hip/knee pain and has continued using it for balance/stability; She states side effects from gamma knife procedure is possible brain swelling from 6 months to 3 years after procedure. She reports occasional headaches; She is still having ringing in the ears on left side which is chronic. She also denies any change in vision;    How long can you sit comfortably? NA  How long can you stand comfortably? will having some pain with initial standing, but then as she moves around its okay    How long can you walk comfortably? 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 There are heterogeneous areas of enhancement of the tumor and different lobules. Differential includes primarily an intraventricular meningioma. However with the enlargement consideration is also given for a tumor of the choroid plexus  such as choroid plexus papilloma although less commonly seen in the lateral ventricles in adult patients, and other tumors. The growth appears slow for a cord plexus carcinoma.    Patient Stated Goals "What is going on with the knee, see if I need surgery"    Currently in Pain? Yes    Pain Score 6     Pain Location Knee    Pain Orientation Right    Pain Descriptors / Indicators Aching;Sore    Pain Type Acute pain    Pain Onset 1 to 4 weeks ago    Pain Frequency Intermittent    Aggravating Factors  worse at night    Pain Relieving Factors sitting alleviates the pain    Effect of Pain on Daily Activities decreased sleeping tolerance/activity tolerance;    Multiple Pain Sites No                  TREATMENT: PT assessed RLE Knee: Mild tightness/trigger points noted in right quadriceps muscle (lateral>medial) superior to patella; PT performed soft/deep tissue massage utilizing edge tool for cross friction to reduce tightness x8 min; Patient tolerated well reporting less discomfort following manual therapy;   Patient reports continued pain along lateral/medial joint line and along superior patella this session.   Patient transitioned to sitting: PT performed grade II-III PA/AP mobilizations tibia on femur to facilitate better joint mobility 20 sec bouts x5 sets each; PT also performed gentle distraction to right knee tibia on femur 10 sec hold x3 reps; Initially pt reported increased discomfort with joint distraction which was alleviated with subsequent repetitions;  PT performed passive RLE Knee flexion/extension followed by grade II-III AP mobs tibia on femur with knee extended 10- sec bouts x5 sets total to facilitate better joint mobility and reduce knee discomfort; PT performed gentle patellofemoral joint mobilization medial/lateral x2 min with no discomfort reported; She does report tenderness along lateral inferior patella with superior/medial mobilization with slight clicking  noted;  Hooklying with LLE knee bent for proper positioning:  SLR flexion 2x10 with cues for core stabilization for better tolerance;   Seated: RLE hamstring curl green tband 2x15 with min VCs for proper positioning;   Standing with RLE on 6 inch step: eccentric lower with LLE heel down to floor x10 reps with BUE rail assist with min VCs for proper positioning to avoid knee valgus/varus for improved motor control;   Patient tolerated session well. She reports slight reduction in knee pain at end of session. Patient instructed in advanced strengthening exercise. She does require min VCs for proper positioning and exercise technique. Patient does report increased knee pain with weight bearing tasks limiting exercise tolerance; Advanced HEP- see patient instructions;  PT Education - 09/15/20 0910    Education Details manual therapy, strengthening/HEP    Person(s) Educated Patient    Methods Explanation;Verbal cues    Comprehension Verbalized understanding;Returned demonstration;Verbal cues required;Need further instruction            PT Short Term Goals - 08/25/20 1156      PT SHORT TERM GOAL #1   Title Patient will be adherent to HEP at least 3x a week to improve functional strength and balance for better safety at home.    Time 4    Period Weeks    Status New    Target Date 09/22/20      PT SHORT TERM GOAL #2   Title Patient will increase RLE knee AROM extension to within 5 degrees of neutral for better standing and walking tolerance;     Time 4    Period Weeks    Status New    Target Date 09/22/20      PT SHORT TERM GOAL #3   Title Patient will reports maximum of 1 sleep disturbances per night over last 3 days to exhibit improved sleeping tolerance with less knee pain and reduced fatigue.     Time 4    Period Weeks    Status New    Target Date 09/22/20             PT Long Term Goals - 08/25/20 1157      PT LONG TERM GOAL #1    Title Patient will increase BLE gross strength to 4+/5 as to improve functional strength for independent gait, increased standing tolerance and increased ADL ability.    Time 8    Period Weeks    Status New    Target Date 10/20/20      PT LONG TERM GOAL #2   Title Patient will improve FOTO score to >55/100 to indicate improved functional mobility with ADLs.    Time 8    Period Weeks    Status New    Target Date 10/20/20      PT LONG TERM GOAL #3   Title Patient will report a worst pain of 3/10 on VAS in      right knee       to improve tolerance with ADLs and reduced symptoms with activities.     Time 8    Period Weeks    Status New    Target Date 10/20/20      PT LONG TERM GOAL #4   Title Patient will complete >1000 feet on 6 min walk test without increase in knee pain to improve community ambulator distance for functional tasks and return to PLOF.    Time 8    Period Weeks    Status New    Target Date 10/20/20      PT LONG TERM GOAL #5   Title Patient will increase 10 meter walk test to >1.28m/s as to improve gait speed for better community ambulation and to reduce fall risk.    Time 8    Period Weeks    Status New    Target Date 10/20/20                 Plan - 09/15/20 0910    Clinical Impression Statement Patient motivated and participated well within session. She was educated in proper sleep positioning including to try a thicker pillow between knees to see if that would provide increased comfort. Patient also exhibits mild trigger points in  right quad muscle this session. PT performed soft/deep tissue massage to reduce discomfort. Patient does report increased pain with joint mobs this session compared to previous session. She was instructed in advanced LE strengthening to improve knee stability and control. See patient instructions. She would benefit from additional skilled PT Intervention to improve strength, balance and mobility while reducing knee pain;     Personal Factors and Comorbidities Age;Comorbidity 3+;Time since onset of injury/illness/exacerbation    Comorbidities HTN, OA, retinal tear in left eye with impaired vision, ringing in left ear, imbalance, A-fib, DMx2 controlled, insomnia, obesity, left intraventricular meningioma- treated with gamma knife in Nov 2021    Examination-Activity Limitations Locomotion Level;Sleep;Squat;Stairs;Stand;Transfers    Examination-Participation Restrictions Cleaning;Community Activity;Shop;Volunteer;Yard Work    [employer] Evolving/Moderate complexity    Rehab Potential Good    PT Frequency 2x / week    PT Duration 8 weeks    PT Treatment/Interventions ADLs/Self Care Home Management;Cryotherapy;Moist Heat;Gait training;Stair training;Functional mobility training;Therapeutic activities;Therapeutic exercise;Balance training;Neuromuscular re-education;Patient/family education;Orthotic Fit/Training;Manual techniques;Passive range of motion;Dry needling;Energy conservation;Taping    PT Next Visit Plan manual therapy/LE strengthening/ROM    PT Home Exercise Plan will address next session    Consulted and Agree with Plan of Care Patient           Patient will benefit from skilled therapeutic intervention in order to improve the following deficits and impairments:  Abnormal gait,Decreased balance,Decreased endurance,Decreased mobility,Difficulty walking,Obesity,Decreased range of motion,Decreased activity tolerance,Decreased strength,Pain  Visit Diagnosis: Acute pain of right knee  Muscle weakness (generalized)  Unsteadiness on feet  Difficulty in walking, not elsewhere classified     Problem List Patient Active Problem List   Diagnosis Date Noted  . Left hip pain 07/10/2020  . Right knee pain 07/10/2020  . Hoarseness of voice 04/21/2020  . Skin tag 03/18/2020  . Trapezius strain, left, initial encounter 02/21/2020  . Left-sided chest pain 10/23/2019  . Overactive bladder  10/09/2019  . Lumbar sprain 03/28/2019  . Tinnitus, bilateral 11/28/2017  . Abnormal MRI of head 11/14/2017  . Meningioma ([place]) 09/21/2017  . PAF (paroxysmal atrial fibrillation) ([place]) 02/03/2017  . Preventative health care 06/02/2016  . Advance directive discussed with patient 06/02/2016  . Achilles tendonitis 03/11/2016  . Hypertension   . DVT, lower extremity, recurrent ([place])   . Type 2 diabetes, controlled, with peripheral neuropathy ([place])   . Generalized osteoarthritis of multiple sites   . GERD (gastroesophageal reflux disease)   . Hyperthyroidism   . Retinal hole of right eye 06/26/2014  . Amblyopia, right 05/08/2014  . Status post intraocular lens implant 05/08/2014    Honestii Marton PT, DPT 09/15/2020, 9:12 AM  [place] MAIN [facility] SERVICES [address] [place], [place], [address] Phone: [phone]   Fax:  [phone]  Name: Angela Bentley MRN: [id number] Date of Birth: August 29, 1941

## 2020-09-14 NOTE — Patient Instructions (Signed)
Access Code: [email or web address] URL: [email or web address] Date: 09/14/2020 Prepared by: Blanche East  Exercises Supine Active Straight Leg Raise - 1 x daily - 7 x weekly - 2 sets - 10 reps Lateral Step Down - 1 x daily - 7 x weekly - 2 sets - 10 reps

## 2020-09-16 ENCOUNTER — Ambulatory Visit: Payer: Medicare Other | Admitting: Physical Therapy

## 2020-09-21 ENCOUNTER — Ambulatory Visit: Payer: Medicare Other | Admitting: Physical Therapy

## 2020-09-21 ENCOUNTER — Encounter: Payer: Self-pay | Admitting: Physical Therapy

## 2020-09-21 ENCOUNTER — Other Ambulatory Visit: Payer: Self-pay

## 2020-09-21 DIAGNOSIS — R262 Difficulty in walking, not elsewhere classified: Secondary | ICD-10-CM | POA: Diagnosis not present

## 2020-09-21 DIAGNOSIS — M6281 Muscle weakness (generalized): Secondary | ICD-10-CM | POA: Diagnosis not present

## 2020-09-21 DIAGNOSIS — M25561 Pain in right knee: Secondary | ICD-10-CM

## 2020-09-21 DIAGNOSIS — R2681 Unsteadiness on feet: Secondary | ICD-10-CM | POA: Diagnosis not present

## 2020-09-21 NOTE — Therapy (Signed)
[facility] MAIN [facility] SERVICES [address] [place], [place], [address] Phone: [phone]   Fax:  [phone]  Physical Therapy Treatment  Patient Details  Name: Angela Bentley MRN: [id number] Date of Birth: Feb 03, 1941 Referring Provider (PT): Roxy Manns, MD   Encounter Date: 09/21/2020   PT End of Session - 09/21/20 1113    Visit Number 6    Number of Visits 17    Date for PT Re-Evaluation 10/19/20    PT Start Time 1102    PT Stop Time 1145    PT Time Calculation (min) 43 min    Activity Tolerance Patient tolerated treatment well;No increased pain    Behavior During Therapy WFL for tasks assessed/performed           Past Medical History:  Diagnosis Date   Balance problems    Brain tumor (HCC)    a. Left intraventricular tumor ->stable by 06/2017 MRI. Followed @ [facility].   Gait disturbance    a. Unsteady on feet/balance difficulty.   Generalized osteoarthritis of multiple sites    GERD (gastroesophageal reflux disease)    History of DVT (deep vein thrombosis) 2016   estrogen and airflights. Rx with xarelto for 3 months   History of stress test    a. 11/2016 ETT: No ST/T changes.  Performed to assess PAC/PVC burden with activity ->No ectopy noted.   Hypertension    Hyperthyroidism    treated briefly in college   Insomnia    Obesity    PAF (paroxysmal atrial fibrillation) (HCC)    a. 12/2016 Event Monitor: brief run of PAF-->CHA2DS2VASc = 5--> Xarelto.   Plantar fasciitis    Retinal tear of left eye    Rosacea    Squamous cell carcinoma of skin 09/07/2017   R dorsum of hand    Type 2 diabetes, controlled, with peripheral neuropathy (HCC)    a. 11/2016 A1c 7.4.   Uterine fibroid     Past Surgical History:  Procedure Laterality Date   CARPAL TUNNEL RELEASE Right    Dr Mina Marble   CATARACT EXTRACTION     Gamma knife  07/2020   intraventricular mass---meningioma or choroid plexus papilloma   RETINAL  TEAR REPAIR CRYOTHERAPY     10/09/13, then again 3/15    There were no vitals filed for this visit.   Subjective Assessment - 09/21/20 1107    Subjective Patient reports that she has been having trouble with her memory over the last week. She states, "I think that my brain is not working as well."    Pertinent History 79 yo Female reports recent new onset of LLE hip pain which started mid October 2021. As a result she is having increased RLE knee pain. She did get X-rays of left hip which shows no arthritis or acute injury. She reports since the hip started hurting it has actually gotten better, but now the right knee is bothering her more and she is having more of a balance issue. She had a gamma knife procedure for Meningioma on 08/14/20; She reports since having the procedure she has noticed shuffling steps and unsteady gait. She has been using tripod base cane due to hip/knee pain and has continued using it for balance/stability; She states side effects from gamma knife procedure is possible brain swelling from 6 months to 3 years after procedure. She reports occasional headaches; She is still having ringing in the ears on left side which is chronic. She also denies any change  in vision;    How long can you sit comfortably? NA    How long can you stand comfortably? will having some pain with initial standing, but then as she moves around its okay    How long can you walk comfortably? 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 There are heterogeneous areas of enhancement of the tumor and different lobules. Differential includes primarily an intraventricular meningioma. However with the enlargement consideration is  also given for a tumor of the choroid plexus such as choroid plexus papilloma although less commonly seen in the lateral ventricles in adult patients, and other tumors. The growth appears slow for a cord plexus carcinoma.    Patient Stated Goals "What is going on with the knee, see if I need surgery"    Currently in Pain? Yes    Pain Score 2     Pain Location Knee    Pain Orientation Right    Pain Descriptors / Indicators Aching;Sore    Pain Type Acute pain    Pain Onset 1 to 4 weeks ago    Pain Frequency Intermittent    Aggravating Factors  worse at night, " I've noticed that I can twist it at night and that's what makes it start hurting."    Pain Relieving Factors sitting alleviates the pain    Effect of Pain on Daily Activities decreased sleeping tolerance/activity tolerance;    Multiple Pain Sites No               TREATMENT: PT assessed RLE Knee: Patient reports continued pain along lateral/medial joint line and along superior patella this session.   Patient transitioned to sitting: PT performed grade II-III PA/AP mobilizations tibia on femur to facilitate better joint mobility 20 sec bouts x5sets each; PT also performed gentle distraction to right knee tibia on femur 10 sec hold x2 reps;   PT performed passive RLE Knee flexion/extension followed by grade II-III AP mobs tibia on femur with knee extended 10- sec bouts x5 sets total to facilitate better joint mobility and reduce knee discomfort; PT performed gentle patellofemoral joint mobilization medial/lateral x2 min with no discomfort reported; She does report tenderness along lateral inferior patella with superior/medial mobilization with slight clicking noted;  Hooklying with LLE knee bent for proper positioning:  RLE SLR flexion 2x15 with cues for core stabilization for better tolerance;  Suspended heel slides x10 reps each LE with mod VCs for increase stabilization to reduce back discomfort;  Bridges with arms across  chest x15 reps with cues to avoid lateral shift to increase core stabilization;   Seated: RLE hamstring curl green tband x15 with min VCs for proper positioning;   Forward step ups with RLE x15 reps with 1-0 rail assist with min A for safety; Patient does exhibit increased knee valgus especially with reduced rail assist having difficulty maintaining balance without rail assist;   Gait on treadmill, 1.2-1.5 mph with 2 HHA for balance x5 min with cues for increased DF at heel strike; patient does exhibit mild antalgic gait with uneven cadence with short step length on LLE with increased foot drag. She reports increased difficulty achieving heel strike on each LE with slight discomfort in RLE; Patient reports mild  fatigue after walking on treadmill with mild shortness of breath noted.   Patient tolerated session well. She reports slight increase in knee pain at end of session. Patient instructed inadvanced strengthening exercise. She does require min VCs for proper positioning and exercise technique. Patient does report increased knee pain with weight bearing tasks limiting exercise tolerance; Reinforced HEP                        PT Education - 09/21/20 1112    Education Details manual therapy, strengthening, HEP    Person(s) Educated Patient    Methods Explanation;Verbal cues    Comprehension Verbalized understanding;Returned demonstration;Verbal cues required;Need further instruction            PT Short Term Goals - 08/25/20 1156      PT SHORT TERM GOAL #1   Title Patient will be adherent to HEP at least 3x a week to improve functional strength and balance for better safety at home.    Time 4    Period Weeks    Status New    Target Date 09/22/20      PT SHORT TERM GOAL #2   Title Patient will increase RLE knee AROM extension to within 5 degrees of neutral for better standing and walking tolerance;     Time 4    Period Weeks    Status New    Target Date  09/22/20      PT SHORT TERM GOAL #3   Title Patient will reports maximum of 1 sleep disturbances per night over last 3 days to exhibit improved sleeping tolerance with less knee pain and reduced fatigue.     Time 4    Period Weeks    Status New    Target Date 09/22/20             PT Long Term Goals - 08/25/20 1157      PT LONG TERM GOAL #1   Title Patient will increase BLE gross strength to 4+/5 as to improve functional strength for independent gait, increased standing tolerance and increased ADL ability.    Time 8    Period Weeks    Status New    Target Date 10/20/20      PT LONG TERM GOAL #2   Title Patient will improve FOTO score to >55/100 to indicate improved functional mobility with ADLs.    Time 8    Period Weeks    Status New    Target Date 10/20/20      PT LONG TERM GOAL #3   Title Patient will report a worst pain of 3/10 on VAS in      right knee       to improve tolerance with ADLs and reduced symptoms with activities.     Time 8    Period Weeks    Status New    Target Date 10/20/20      PT LONG TERM GOAL #4   Title Patient will complete >1000 feet on 6 min walk test without increase in knee pain to improve community ambulator distance for functional tasks and return to PLOF.    Time 8    Period Weeks    Status New    Target Date 10/20/20      PT LONG TERM GOAL #5   Title Patient will increase 10 meter walk test to >1.57m/s as to improve gait speed for better community ambulation and to reduce fall risk.  Time 8    Period Weeks    Status New    Target Date 10/20/20                 Plan - 09/21/20 1305    Clinical Impression Statement Patient motivated and participated well within session. She was instructed in advanced LE strengthening focusing on quad strength for better knee control. Patient continues to have some pain in RLE knee with resisted exercise requiring cues for proper positioning and short rest breaks. She does fatigue quickly due  to LE weakness and decreased endurance. She reports mild increase in RLE knee pain at end of session but states its minimal. Reinforced HEP. Patient would benefit from additional skilled PT Intervention to improve strength, knee ROM and reduce pain with ADLs.    Personal Factors and Comorbidities Age;Comorbidity 3+;Time since onset of injury/illness/exacerbation    Comorbidities HTN, OA, retinal tear in left eye with impaired vision, ringing in left ear, imbalance, A-fib, DMx2 controlled, insomnia, obesity, left intraventricular meningioma- treated with gamma knife in Nov 2021    Examination-Activity Limitations Locomotion Level;Sleep;Squat;Stairs;Stand;Transfers    Examination-Participation Restrictions Cleaning;Community Activity;Shop;Volunteer;Yard Work    [employer] Evolving/Moderate complexity    Rehab Potential Good    PT Frequency 2x / week    PT Duration 8 weeks    PT Treatment/Interventions ADLs/Self Care Home Management;Cryotherapy;Moist Heat;Gait training;Stair training;Functional mobility training;Therapeutic activities;Therapeutic exercise;Balance training;Neuromuscular re-education;Patient/family education;Orthotic Fit/Training;Manual techniques;Passive range of motion;Dry needling;Energy conservation;Taping    PT Next Visit Plan manual therapy/LE strengthening/ROM    PT Home Exercise Plan will address next session    Consulted and Agree with Plan of Care Patient           Patient will benefit from skilled therapeutic intervention in order to improve the following deficits and impairments:  Abnormal gait,Decreased balance,Decreased endurance,Decreased mobility,Difficulty walking,Obesity,Decreased range of motion,Decreased activity tolerance,Decreased strength,Pain  Visit Diagnosis: Acute pain of right knee  Muscle weakness (generalized)  Unsteadiness on feet     Problem List Patient Active Problem List   Diagnosis Date Noted   Left hip pain  07/10/2020   Right knee pain 07/10/2020   Hoarseness of voice 04/21/2020   Skin tag 03/18/2020   Trapezius strain, left, initial encounter 02/21/2020   Left-sided chest pain 10/23/2019   Overactive bladder 10/09/2019   Lumbar sprain 03/28/2019   Tinnitus, bilateral 11/28/2017   Abnormal MRI of head 11/14/2017   Meningioma ([place]) 09/21/2017   PAF (paroxysmal atrial fibrillation) ([place]) 02/03/2017   Preventative health care 06/02/2016   Advance directive discussed with patient 06/02/2016   Achilles tendonitis 03/11/2016   Hypertension    DVT, lower extremity, recurrent ([place])    Type 2 diabetes, controlled, with peripheral neuropathy (HCC)    Generalized osteoarthritis of multiple sites    GERD (gastroesophageal reflux disease)    Hyperthyroidism    Retinal hole of right eye 06/26/2014   Amblyopia, right 05/08/2014   Status post intraocular lens implant 05/08/2014    Angela Bentley PT, DPT 09/21/2020, 1:11 PM  [facility] [address] [place], [place], [address] Phone: [phone]   Fax:  [phone]  Name: Angela Bentley MRN: [id number] Date of Birth: 04-14-1941

## 2020-09-23 ENCOUNTER — Other Ambulatory Visit: Payer: Self-pay

## 2020-09-23 ENCOUNTER — Encounter: Payer: Self-pay | Admitting: Physical Therapy

## 2020-09-23 ENCOUNTER — Ambulatory Visit: Payer: Medicare Other | Admitting: Physical Therapy

## 2020-09-23 DIAGNOSIS — M25561 Pain in right knee: Secondary | ICD-10-CM

## 2020-09-23 DIAGNOSIS — M6281 Muscle weakness (generalized): Secondary | ICD-10-CM

## 2020-09-23 DIAGNOSIS — R2681 Unsteadiness on feet: Secondary | ICD-10-CM

## 2020-09-23 DIAGNOSIS — R262 Difficulty in walking, not elsewhere classified: Secondary | ICD-10-CM | POA: Diagnosis not present

## 2020-09-23 NOTE — Therapy (Signed)
[facility] [address] [place], [place], [address] Phone: [phone]   Fax:  [phone]  Physical Therapy Treatment  Patient Details  Name: Angela Bentley MRN: [id number] Date of Birth: August 30, 1941 Referring Provider (PT): Roxy MannsMarne Tower, MD   Encounter Date: 09/23/2020   PT End of Session - 09/23/20 1100    Visit Number 7    Number of Visits 17    Date for PT Re-Evaluation 10/19/20    PT Start Time 1102    PT Stop Time 1145    PT Time Calculation (min) 43 min    Activity Tolerance Patient tolerated treatment well;No increased pain    Behavior During Therapy WFL for tasks assessed/performed           Past Medical History:  Diagnosis Date  . Balance problems   . Brain tumor (HCC)    a. Left intraventricular tumor ->stable by 06/2017 MRI. Followed @ [facility].  . Gait disturbance    a. Unsteady on feet/balance difficulty.  . Generalized osteoarthritis of multiple sites   . GERD (gastroesophageal reflux disease)   . History of DVT (deep vein thrombosis) 2016   estrogen and airflights. Rx with xarelto for 3 months  . History of stress test    a. 11/2016 ETT: No ST/T changes.  Performed to assess PAC/PVC burden with activity ->No ectopy noted.  . Hypertension   . Hyperthyroidism    treated briefly in college  . Insomnia   . Obesity   . PAF (paroxysmal atrial fibrillation) (HCC)    a. 12/2016 Event Monitor: brief run of PAF-->CHA2DS2VASc = 5--> Xarelto.  . Plantar fasciitis   . Retinal tear of left eye   . Rosacea   . Squamous cell carcinoma of skin 09/07/2017   R dorsum of hand   . Type 2 diabetes, controlled, with peripheral neuropathy (HCC)    a. 11/2016 A1c 7.4.  . Uterine fibroid     Past Surgical History:  Procedure Laterality Date  . CARPAL TUNNEL RELEASE Right    Dr Mina MarbleWeingold  . CATARACT EXTRACTION    . 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 RETINAL  TEAR REPAIR CRYOTHERAPY     10/09/13, then again 3/15    There were no vitals filed for this visit.   Subjective Assessment - 09/23/20 1104    Subjective Patient reports her right knee has really flared up. It started hurting more last night and she didnt sleep well. She presents to therapy wearing soft knee brace. She reports her pain is all over her knee today. She isn't sure what is going on;    Pertinent History 79 yo Female reports recent new onset of LLE hip pain which started mid October 2021. As a result she is having increased RLE knee pain. She did get X-rays of left hip which shows no arthritis or acute injury. She reports since the hip started hurting it has actually gotten better, but now the right knee is bothering her more and she is having more of a balance issue. She had a gamma knife procedure for Meningioma on 08/14/20; She reports since having the procedure she has noticed shuffling steps and unsteady gait. She has been using tripod base cane due to hip/knee pain and has continued using it for balance/stability; She states side effects from gamma knife procedure is possible brain swelling from 6 months to 3 years after procedure. She reports occasional headaches; She  is still having ringing in the ears on left side which is chronic. She also denies any change in vision;    How long can you sit comfortably? NA    How long can you stand comfortably? will having some pain with initial standing, but then as she moves around its okay    How long can you walk comfortably? 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 There are heterogeneous areas of enhancement of the tumor and different lobules.  Differential includes primarily an intraventricular meningioma. However with the enlargement consideration is also given for a tumor of the choroid plexus such as choroid plexus papilloma although less commonly seen in the lateral ventricles in adult patients, and other tumors. The growth appears slow for a cord plexus carcinoma.    Patient Stated Goals "What is going on with the knee, see if I need surgery"    Currently in Pain? Yes    Pain Score 6     Pain Location Knee    Pain Orientation Right    Pain Descriptors / Indicators Aching;Sore    Pain Type Acute pain    Pain Onset 1 to 4 weeks ago    Pain Frequency Intermittent    Aggravating Factors  worse at night    Pain Relieving Factors sitting does alleviate some, but its flared up today    Effect of Pain on Daily Activities decreased sleeping tolerance/activity tolerance;    Multiple Pain Sites No              TREATMENT: PT assessed RLE Knee: Patient reports continued pain along lateral/medial joint line and along superior patella this session.   Patient transitioned to supine:  PT performed grade II-III PA/AP mobilizations tibia on femur to facilitate better joint mobility 20 sec bouts x3sets each; reports pain and tenderness with AP mobs likely from pressure on anterior tibia;  PT also performed gentle long axis distraction to RLE 15 sec hold x3 reps;  PT performed passive RLE Knee flexion/extension with good tolerance reporting less discomfort with PROM;  PT performed gentle patellofemoral joint mobilization medial/lateral x2 min with no discomfort reported; She does report tenderness along lateral inferior patella with superior/medial mobilization with slight clicking noted;  Hooklying with LLE knee bent for proper positioning: RLE SLR flexion x15 with cues for core stabilization for better tolerance;  Heel slides 2x15 reps RLE with mod VCs for increase stabilization to reduce back discomfort;  Passive single knee to  chest 15 sec hold x1 rep with cues to reach behind knee for less knee discomfort;   Seated: RLE hamstring curl green tband x15 with min VCs for proper positioning;   Gait on treadmill, 1.2-1.5 mph with 2 HHA for balance x5 min with cues for increased DF at heel strike; patient does exhibit mild antalgic gait with uneven cadence with short step length on LLE with increased foot drag. She reports increased difficulty achieving heel strike on each LE with slight discomfort in RLE; Patient reports mild fatigue after walking on treadmill with mild shortness of breath noted.   Educated patient on proper sitting position with instruction to extend RLE as tolerated for better positioning;   Patient tolerated session fair. She reports  increased knee pain overall with increased crepitis in right patella with patella mobs with tenderness noted along inferior/left patella along proximal tibia; Patient reports less discomfort with passive knee flexion/extension; She does present with soft knee brace; educated pain in safe brace wear and proper positioning;                PT Education - 09/23/20 1100    Education Details manual therapy, LE strengthening, HEP    Person(s) Educated Patient    Methods Explanation;Verbal cues    Comprehension Verbalized understanding;Verbal cues required;Returned demonstration;Need further instruction            PT Short Term Goals - 08/25/20 1156      PT SHORT TERM GOAL #1   Title Patient will be adherent to HEP at least 3x a week to improve functional strength and balance for better safety at home.    Time 4    Period Weeks    Status New    Target Date 09/22/20      PT SHORT TERM GOAL #2   Title Patient will increase RLE knee AROM extension to within 5 degrees of neutral for better standing and walking tolerance;     Time 4    Period Weeks    Status New    Target Date 09/22/20      PT SHORT TERM GOAL #3   Title Patient will reports maximum of 1  sleep disturbances per night over last 3 days to exhibit improved sleeping tolerance with less knee pain and reduced fatigue.     Time 4    Period Weeks    Status New    Target Date 09/22/20             PT Long Term Goals - 08/25/20 1157      PT LONG TERM GOAL #1   Title Patient will increase BLE gross strength to 4+/5 as to improve functional strength for independent gait, increased standing tolerance and increased ADL ability.    Time 8    Period Weeks    Status New    Target Date 10/20/20      PT LONG TERM GOAL #2   Title Patient will improve FOTO score to >55/100 to indicate improved functional mobility with ADLs.    Time 8    Period Weeks    Status New    Target Date 10/20/20      PT LONG TERM GOAL #3   Title Patient will report a worst pain of 3/10 on VAS in      right knee       to improve tolerance with ADLs and reduced symptoms with activities.     Time 8    Period Weeks    Status New    Target Date 10/20/20      PT LONG TERM GOAL #4   Title Patient will complete >1000 feet on 6 min walk test without increase in knee pain to improve community ambulator distance for functional tasks and return to PLOF.    Time 8    Period Weeks    Status New    Target Date 10/20/20      PT LONG TERM GOAL #5   Title Patient will increase 10 meter walk test to >1.32m/s as to improve gait speed for better community ambulation and to reduce fall risk.    Time 8    Period Weeks    Status New    Target Date 10/20/20  Plan - 09/24/20 0825    Clinical Impression Statement Patient motivated and participated fair within session. She reports increased knee pain today and had a harder time sleeping last night. patient exhibits increased tenderness along right lateral knee particularly at joint line consistent with possible lateral meniscus tear. PT instructed patient in LE strengthening exercise to tolerance. She is limited in knee extension due to history of  patella femoral pain. Patient educated on importance of reducing excessive knee flexion when resting. She also was educated with cryotherapy for pain relief. PT also applied soft knee brace to patient's right knee with instruction on how to safely apply for best pain relief. Patient would benefit from additional skilled PT Intervention to improve strength, balance and mobiltiy;    Personal Factors and Comorbidities Age;Comorbidity 3+;Time since onset of injury/illness/exacerbation    Comorbidities HTN, OA, retinal tear in left eye with impaired vision, ringing in left ear, imbalance, A-fib, DMx2 controlled, insomnia, obesity, left intraventricular meningioma- treated with gamma knife in Nov 2021    Examination-Activity Limitations Locomotion Level;Sleep;Squat;Stairs;Stand;Transfers    Examination-Participation Restrictions Cleaning;Community Activity;Shop;Volunteer;Yard Work    Conservation officer, historic buildings Evolving/Moderate complexity    Rehab Potential Good    PT Frequency 2x / week    PT Duration 8 weeks    PT Treatment/Interventions ADLs/Self Care Home Management;Cryotherapy;Moist Heat;Gait training;Stair training;Functional mobility training;Therapeutic activities;Therapeutic exercise;Balance training;Neuromuscular re-education;Patient/family education;Orthotic Fit/Training;Manual techniques;Passive range of motion;Dry needling;Energy conservation;Taping    PT Next Visit Plan manual therapy/LE strengthening/ROM    PT Home Exercise Plan will address next session    Consulted and Agree with Plan of Care Patient           Patient will benefit from skilled therapeutic intervention in order to improve the following deficits and impairments:  Abnormal gait,Decreased balance,Decreased endurance,Decreased mobility,Difficulty walking,Obesity,Decreased range of motion,Decreased activity tolerance,Decreased strength,Pain  Visit Diagnosis: Acute pain of right knee  Muscle weakness  (generalized)  Unsteadiness on feet  Difficulty in walking, not elsewhere classified     Problem List Patient Active Problem List   Diagnosis Date Noted  . Left hip pain 07/10/2020  . Right knee pain 07/10/2020  . Hoarseness of voice 04/21/2020  . Skin tag 03/18/2020  . Trapezius strain, left, initial encounter 02/21/2020  . Left-sided chest pain 10/23/2019  . Overactive bladder 10/09/2019  . Lumbar sprain 03/28/2019  . Tinnitus, bilateral 11/28/2017  . Abnormal MRI of head 11/14/2017  . Meningioma (HCC) 09/21/2017  . PAF (paroxysmal atrial fibrillation) (HCC) 02/03/2017  . Preventative health care 06/02/2016  . Advance directive discussed with patient 06/02/2016  . Achilles tendonitis 03/11/2016  . Hypertension   . DVT, lower extremity, recurrent (HCC)   . Type 2 diabetes, controlled, with peripheral neuropathy (HCC)   . Generalized osteoarthritis of multiple sites   . GERD (gastroesophageal reflux disease)   . Hyperthyroidism   . Retinal hole of right eye 06/26/2014  . Amblyopia, right 05/08/2014  . Status post intraocular lens implant 05/08/2014    Angela Bentley PT, DPT 09/24/2020, 8:33 AM  [facility] MAIN [facility] SERVICES [address] [place], [place], [address] Phone: [phone]   Fax:  [phone]  Name: Angela Bentley MRN: [id number] Date of Birth: 1941-04-07

## 2020-09-24 ENCOUNTER — Ambulatory Visit: Payer: Medicare Other | Admitting: Dermatology

## 2020-09-28 ENCOUNTER — Other Ambulatory Visit: Payer: Self-pay

## 2020-09-28 ENCOUNTER — Ambulatory Visit: Payer: Medicare Other | Attending: Family Medicine | Admitting: Physical Therapy

## 2020-09-28 DIAGNOSIS — G8929 Other chronic pain: Secondary | ICD-10-CM | POA: Diagnosis not present

## 2020-09-28 DIAGNOSIS — R262 Difficulty in walking, not elsewhere classified: Secondary | ICD-10-CM | POA: Diagnosis not present

## 2020-09-28 DIAGNOSIS — M25561 Pain in right knee: Secondary | ICD-10-CM

## 2020-09-28 DIAGNOSIS — M79672 Pain in left foot: Secondary | ICD-10-CM | POA: Diagnosis not present

## 2020-09-28 DIAGNOSIS — R2681 Unsteadiness on feet: Secondary | ICD-10-CM | POA: Diagnosis not present

## 2020-09-28 DIAGNOSIS — M6281 Muscle weakness (generalized): Secondary | ICD-10-CM | POA: Diagnosis not present

## 2020-09-28 DIAGNOSIS — M79671 Pain in right foot: Secondary | ICD-10-CM | POA: Insufficient documentation

## 2020-09-28 NOTE — Therapy (Signed)
[place] MAIN [facility] SERVICES [address] [place], [place], [address] Phone: [phone]   Fax:  [phone]  Physical Therapy Treatment  Patient Details  Name: Angela Bentley MRN: [id number] Date of Birth: 12-15-1940 Referring Provider (PT): Loura Pardon, MD   Encounter Date: 09/28/2020   PT End of Session - 09/30/20 1358    Visit Number 8    Number of Visits 17    Date for PT Re-Evaluation 10/19/20    PT Start Time [id number]    PT Stop Time 1430    PT Time Calculation (min) 45 min    Activity Tolerance Patient tolerated treatment well;No increased pain    Behavior During Therapy WFL for tasks assessed/performed           Past Medical History:  Diagnosis Date  . Balance problems   . Brain tumor ([place])    a. Left intraventricular tumor ->stable by 06/2017 MRI. Followed @ [facility].  . Gait disturbance    a. Unsteady on feet/balance difficulty.  . Generalized osteoarthritis of multiple sites   . GERD (gastroesophageal reflux disease)   . History of DVT (deep vein thrombosis) 2016   estrogen and airflights. Rx with xarelto for 3 months  . History of stress test    a. 11/2016 ETT: No ST/T changes.  Performed to assess PAC/PVC burden with activity ->No ectopy noted.  . Hypertension   . Hyperthyroidism    treated briefly in college  . Insomnia   . Obesity   . PAF (paroxysmal atrial fibrillation) ([place])    a. 12/2016 Event Monitor: brief run of PAF-->CHA2DS2VASc = 5--> Xarelto.  . Plantar fasciitis   . Retinal tear of left eye   . Rosacea   . Squamous cell carcinoma of skin 09/07/2017   R dorsum of hand   . Type 2 diabetes, controlled, with peripheral neuropathy ([place])    a. 11/2016 A1c 7.4.  . Uterine fibroid     Past Surgical History:  Procedure Laterality Date  . CARPAL TUNNEL RELEASE Right    Dr Burney Gauze  . CATARACT EXTRACTION    . 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 RETINAL TEAR  REPAIR CRYOTHERAPY     10/09/13, then again 3/15    There were no vitals filed for this visit.   Subjective Assessment - 09/30/20 1352    Subjective Patient reports continued right knee discomfort. She reports she hasn't been sleeping well and that has made her balance and everything else worse.    Pertinent History 80 yo Female reports recent new onset of LLE hip pain which started mid October 2021. As a result she is having increased RLE knee pain. She did get X-rays of left hip which shows no arthritis or acute injury. She reports since the hip started hurting it has actually gotten better, but now the right knee is bothering her more and she is having more of a balance issue. She had a gamma knife procedure for Meningioma on 08/14/20; She reports since having the procedure she has noticed shuffling steps and unsteady gait. She has been using tripod base cane due to hip/knee pain and has continued using it for balance/stability; She states side effects from gamma knife procedure is possible brain swelling from 6 months to 3 years after procedure. She reports occasional headaches; She is still having ringing in the ears on left side which is chronic. She also denies any change in vision;  How long can you sit comfortably? NA    How long can you stand comfortably? will having some pain with initial standing, but then as she moves around its okay    How long can you walk comfortably? 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 There are heterogeneous areas of enhancement of the tumor and different lobules. Differential includes primarily an intraventricular meningioma. However with the enlargement consideration is  also given for a tumor of the choroid plexus such as choroid plexus papilloma although less commonly seen in the lateral ventricles in adult patients, and other tumors. The growth appears slow for a cord plexus carcinoma.    Patient Stated Goals "What is going on with the knee, see if I need surgery"    Currently in Pain? Yes    Pain Score 4     Pain Location Knee    Pain Orientation Right    Pain Descriptors / Indicators Aching;Sore    Pain Type Acute pain    Pain Onset 1 to 4 weeks ago    Pain Frequency Intermittent    Aggravating Factors  worse at night    Pain Relieving Factors sitting does alleviate pain some,    Effect of Pain on Daily Activities decreased sleeping tolerance;    Multiple Pain Sites No                      TREATMENT: PT assessed RLE Knee: Patient reports continued pain along lateral/medial joint line and along superior patella this session.   Patient sitting:  PT performed passive RLE Knee flexion/extension with good tolerance reporting less discomfort with PROM;  PT performed passive RLE knee flexion followed by extension with grade II-III AP mobs tibia on femur 10 sec bouts x5 sets;  PT performed gentle patellofemoral joint mobilization medial/lateral x2 min with no discomfort reported; She does report tenderness along lateral inferior patella with superior/medial mobilization with slight clicking noted Patient hooklying:  Patient exhibits RLE distal quad tightness especially along lateral quad muscle; PT performed soft/deep tissue massage utilizing massage cream and biofreeze with IASTM edge tool x8 min with good tolerance reported; Patient reports less stiffness and less discomfort;    Hooklying with LLE knee bent for proper positioning: RLESLR flexionx15with cues for core stabilization for better tolerance;  Heel slides x15 reps RLE with mod VCs for increase stabilization to reduce back discomfort;    Seated: RLE hamstring curl green  tband x15 with min VCs for proper positioning;   Gait on treadmill, 1.3-1.6 mph with 2 HHA for balance x5 min with cues for increased DF at heel strike; patient does exhibit mild antalgic gait with uneven cadence with short step length on LLE with increased foot drag. She reports increased difficulty achieving heel strike on each LE with slight discomfort in RLE; Patient reports mild fatigue after walking on treadmill with mild shortness of breath noted.   Leg press BLE 55# x10 reps with patient reporting she was using LLE more than RLE with minimal discomfort RLE Only plate 69# C78 reps with no pain reported but moderate difficulty; Required min VCS for proper positioning to avoid knee valgus position for better quad strengthening;   Educated patient  on proper sitting position with instruction to extend RLE as tolerated for better positioning;   Patient tolerated session well. She reports less pain in knee this session being able to tolerate advanced LE strengthening and exercise. She does require min VCs for proper exercise technique/positioning. Reinforced HEP.                   PT Education - 09/30/20 1353    Education Details LE strengthening, HEP    Person(s) Educated Patient    Methods Explanation    Comprehension Verbalized understanding            PT Short Term Goals - 08/25/20 1156      PT SHORT TERM GOAL #1   Title Patient will be adherent to HEP at least 3x a week to improve functional strength and balance for better safety at home.    Time 4    Period Weeks    Status New    Target Date 09/22/20      PT SHORT TERM GOAL #2   Title Patient will increase RLE knee AROM extension to within 5 degrees of neutral for better standing and walking tolerance;     Time 4    Period Weeks    Status New    Target Date 09/22/20      PT SHORT TERM GOAL #3   Title Patient will reports maximum of 1 sleep disturbances per night over last 3 days to exhibit improved  sleeping tolerance with less knee pain and reduced fatigue.     Time 4    Period Weeks    Status New    Target Date 09/22/20             PT Long Term Goals - 08/25/20 1157      PT LONG TERM GOAL #1   Title Patient will increase BLE gross strength to 4+/5 as to improve functional strength for independent gait, increased standing tolerance and increased ADL ability.    Time 8    Period Weeks    Status New    Target Date 10/20/20      PT LONG TERM GOAL #2   Title Patient will improve FOTO score to >55/100 to indicate improved functional mobility with ADLs.    Time 8    Period Weeks    Status New    Target Date 10/20/20      PT LONG TERM GOAL #3   Title Patient will report a worst pain of 3/10 on VAS in      right knee       to improve tolerance with ADLs and reduced symptoms with activities.     Time 8    Period Weeks    Status New    Target Date 10/20/20      PT LONG TERM GOAL #4   Title Patient will complete >1000 feet on 6 min walk test without increase in knee pain to improve community ambulator distance for functional tasks and return to PLOF.    Time 8    Period Weeks    Status New    Target Date 10/20/20      PT LONG TERM GOAL #5   Title Patient will increase 10 meter walk test to >1.7m/s as to improve gait speed for better community ambulation and to reduce fall risk.    Time 8    Period Weeks    Status New    Target Date 10/20/20  Plan - 09/30/20 1354    Clinical Impression Statement Patient motivated and participated well within session. She does exhibit increased swelling and tightness in right quad muscle this session which was alleviated with manual therapy. Patient able to tolerate advanced LE strengthening with less discomfort compared to previous sessions. She does require min VCS for proper exercise technique for optimal muscle activation and positioning. Patient reports less pain at end of session. She would benefit from  additional skilled PT Intervention to improve strength, balance and mobility and reduce knee pain;    Personal Factors and Comorbidities Age;Comorbidity 3+;Time since onset of injury/illness/exacerbation    Comorbidities HTN, OA, retinal tear in left eye with impaired vision, ringing in left ear, imbalance, A-fib, DMx2 controlled, insomnia, obesity, left intraventricular meningioma- treated with gamma knife in Nov 2021    Examination-Activity Limitations Locomotion Level;Sleep;Squat;Stairs;Stand;Transfers    Examination-Participation Restrictions Cleaning;Community Activity;Shop;Volunteer;Yard Work    [employer] Evolving/Moderate complexity    Rehab Potential Good    PT Frequency 2x / week    PT Duration 8 weeks    PT Treatment/Interventions ADLs/Self Care Home Management;Cryotherapy;Moist Heat;Gait training;Stair training;Functional mobility training;Therapeutic activities;Therapeutic exercise;Balance training;Neuromuscular re-education;Patient/family education;Orthotic Fit/Training;Manual techniques;Passive range of motion;Dry needling;Energy conservation;Taping    PT Next Visit Plan manual therapy/LE strengthening/ROM    PT Home Exercise Plan will address next session    Consulted and Agree with Plan of Care Patient           Patient will benefit from skilled therapeutic intervention in order to improve the following deficits and impairments:  Abnormal gait,Decreased balance,Decreased endurance,Decreased mobility,Difficulty walking,Obesity,Decreased range of motion,Decreased activity tolerance,Decreased strength,Pain  Visit Diagnosis: Acute pain of right knee  Muscle weakness (generalized)  Unsteadiness on feet  Difficulty in walking, not elsewhere classified     Problem List Patient Active Problem List   Diagnosis Date Noted  . Left hip pain 07/10/2020  . Right knee pain 07/10/2020  . Hoarseness of voice 04/21/2020  . Skin tag 03/18/2020  . Trapezius  strain, left, initial encounter 02/21/2020  . Left-sided chest pain 10/23/2019  . Overactive bladder 10/09/2019  . Lumbar sprain 03/28/2019  . Tinnitus, bilateral 11/28/2017  . Abnormal MRI of head 11/14/2017  . Meningioma ([place]) 09/21/2017  . PAF (paroxysmal atrial fibrillation) ([place]) 02/03/2017  . Preventative health care 06/02/2016  . Advance directive discussed with patient 06/02/2016  . Achilles tendonitis 03/11/2016  . Hypertension   . DVT, lower extremity, recurrent ([place])   . Type 2 diabetes, controlled, with peripheral neuropathy ([place])   . Generalized osteoarthritis of multiple sites   . GERD (gastroesophageal reflux disease)   . Hyperthyroidism   . Retinal hole of right eye 06/26/2014  . Amblyopia, right 05/08/2014  . Status post intraocular lens implant 05/08/2014    Junius Faucett PT, DPT 09/30/2020, 2:00 PM  [place] MAIN [facility] SERVICES [address] [place], [place], [address] Phone: [phone]   Fax:  [phone]  Name: Angela Bentley MRN: [id number] Date of Birth: [birth year]

## 2020-09-30 ENCOUNTER — Ambulatory Visit: Payer: Medicare Other | Admitting: Physical Therapy

## 2020-09-30 ENCOUNTER — Other Ambulatory Visit: Payer: Self-pay

## 2020-09-30 DIAGNOSIS — M79671 Pain in right foot: Secondary | ICD-10-CM

## 2020-09-30 DIAGNOSIS — R262 Difficulty in walking, not elsewhere classified: Secondary | ICD-10-CM

## 2020-09-30 DIAGNOSIS — G8929 Other chronic pain: Secondary | ICD-10-CM | POA: Diagnosis not present

## 2020-09-30 DIAGNOSIS — M6281 Muscle weakness (generalized): Secondary | ICD-10-CM

## 2020-09-30 DIAGNOSIS — R2681 Unsteadiness on feet: Secondary | ICD-10-CM | POA: Diagnosis not present

## 2020-09-30 DIAGNOSIS — M79672 Pain in left foot: Secondary | ICD-10-CM | POA: Diagnosis not present

## 2020-09-30 DIAGNOSIS — M25561 Pain in right knee: Secondary | ICD-10-CM

## 2020-09-30 NOTE — Therapy (Signed)
[place] [facility] MAIN [facility] SERVICES [address] [place], [place], [address] Phone: [phone]   Fax:  [phone]  Physical Therapy Treatment  Patient Details  Name: Angela Bentley MRN: [id number] Date of Birth: [birth year] Referring Provider (PT): Roxy Manns, MD   Encounter Date: 09/30/2020   PT End of Session - 09/30/20 1257    Visit Number 8    Number of Visits 17    Date for PT Re-Evaluation 10/19/20    PT Start Time 1300    PT Stop Time 1345    PT Time Calculation (min) 45 min    Activity Tolerance Patient tolerated treatment well;No increased pain    Behavior During Therapy WFL for tasks assessed/performed           Past Medical History:  Diagnosis Date  . Balance problems   . Brain tumor ([facility])    a. Left intraventricular tumor ->stable by 06/2017 MRI. Followed @ [facility].  . Gait disturbance    a. Unsteady on feet/balance difficulty.  . Generalized osteoarthritis of multiple sites   . GERD (gastroesophageal reflux disease)   . History of DVT (deep vein thrombosis) 2016   estrogen and airflights. Rx with xarelto for 3 months  . History of stress test    a. 11/2016 ETT: No ST/T changes.  Performed to assess PAC/PVC burden with activity ->No ectopy noted.  . Hypertension   . Hyperthyroidism    treated briefly in college  . Insomnia   . Obesity   . PAF (paroxysmal atrial fibrillation) ([facility])    a. 12/2016 Event Monitor: brief run of PAF-->CHA2DS2VASc = 5--> Xarelto.  . Plantar fasciitis   . Retinal tear of left eye   . Rosacea   . Squamous cell carcinoma of skin 09/07/2017   R dorsum of hand   . Type 2 diabetes, controlled, with peripheral neuropathy ([facility])    a. 11/2016 A1c 7.4.  . Uterine fibroid     Past Surgical History:  Procedure Laterality Date  . CARPAL TUNNEL RELEASE Right    Dr Mina Marble  . CATARACT EXTRACTION    . 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 RETINAL TEAR  REPAIR CRYOTHERAPY     10/09/13, then again 3/15    There were no vitals filed for this visit.   Subjective Assessment - 09/30/20 1306    Subjective Patient reports her right knee has really flared up. It started hurting more last night and she didnt sleep well. She presents to therapy wearing soft knee brace. She reports her pain is all over her knee today. She isn't sure what is going on;    Pertinent History 80 yo Female reports recent new onset of LLE hip pain which started mid October 2021. As a result she is having increased RLE knee pain. She did get X-rays of left hip which shows no arthritis or acute injury. She reports since the hip started hurting it has actually gotten better, but now the right knee is bothering her more and she is having more of a balance issue. She had a gamma knife procedure for Meningioma on 08/14/20; She reports since having the procedure she has noticed shuffling steps and unsteady gait. She has been using tripod base cane due to hip/knee pain and has continued using it for balance/stability; She states side effects from gamma knife procedure is possible brain swelling from 6 months to 3 years after procedure. She reports occasional headaches; She  is still having ringing in the ears on left side which is chronic. She also denies any change in vision;    How long can you sit comfortably? NA    How long can you stand comfortably? will having some pain with initial standing, but then as she moves around its okay    How long can you walk comfortably? 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 There are heterogeneous areas of enhancement of the tumor and different lobules. Differential  includes primarily an intraventricular meningioma. However with the enlargement consideration is also given for a tumor of the choroid plexus such as choroid plexus papilloma although less commonly seen in the lateral ventricles in adult patients, and other tumors. The growth appears slow for a cord plexus carcinoma.    Patient Stated Goals "What is going on with the knee, see if I need surgery"    Pain Onset 1 to 4 weeks ago          Manual:  STM along anterior distal quads and hamstrings on RLE   There Ex: Supine SAQ over bolster x 2 15 reps each leg  SLR x 3 10 reps each leg with 2# ankle weights PROM supine hamstring stretch x 3 30s holds Seated LAQ x 2 15 reps each leg with 2# ankle weights Standing hamstring curls x 2 15 reps each leg with 2# ankle weights            PT Short Term Goals - 08/25/20 1156      PT SHORT TERM GOAL #1   Title Patient will be adherent to HEP at least 3x a week to improve functional strength and balance for better safety at home.    Time 4    Period Weeks    Status New    Target Date 09/22/20      PT SHORT TERM GOAL #2   Title Patient will increase RLE knee AROM extension to within 5 degrees of neutral for better standing and walking tolerance;     Time 4    Period Weeks    Status New    Target Date 09/22/20      PT SHORT TERM GOAL #3   Title Patient will reports maximum of 1 sleep disturbances per night over last 3 days to exhibit improved sleeping tolerance with less knee pain and reduced fatigue.     Time 4    Period Weeks    Status New    Target Date 09/22/20             PT Long Term Goals - 08/25/20 1157      PT LONG TERM GOAL #1   Title Patient will increase BLE gross strength to 4+/5 as to improve functional strength for independent gait, increased standing tolerance and increased ADL ability.    Time 8    Period Weeks    Status New    Target Date 10/20/20      PT LONG TERM GOAL #2   Title Patient will improve FOTO  score to >55/100 to indicate improved functional mobility with ADLs.    Time 8    Period Weeks    Status New    Target  Date 10/20/20      PT LONG TERM GOAL #3   Title Patient will report a worst pain of 3/10 on VAS in      right knee       to improve tolerance with ADLs and reduced symptoms with activities.     Time 8    Period Weeks    Status New    Target Date 10/20/20      PT LONG TERM GOAL #4   Title Patient will complete >1000 feet on 6 min walk test without increase in knee pain to improve community ambulator distance for functional tasks and return to PLOF.    Time 8    Period Weeks    Status New    Target Date 10/20/20      PT LONG TERM GOAL #5   Title Patient will increase 10 meter walk test to >1.43m/s as to improve gait speed for better community ambulation and to reduce fall risk.    Time 8    Period Weeks    Status New    Target Date 10/20/20                 Plan - 09/30/20 1428    Clinical Impression Statement Patient reports of relief with deep STM along medial distal hamstring insertion site. Patient tolerated strengthening exercises with muscle fatigue noted towards the end of set due to decreased tissue capacity to activity. Patient reports that the exercise got easier after one set and was able to complete with ankle weights today.Patient will continue to benefit from skilled physical therapy to improve generalized strength, ROM, and capacity for functional activity.    Personal Factors and Comorbidities Age;Comorbidity 3+;Time since onset of injury/illness/exacerbation    Comorbidities HTN, OA, retinal tear in left eye with impaired vision, ringing in left ear, imbalance, A-fib, DMx2 controlled, insomnia, obesity, left intraventricular meningioma- treated with gamma knife in Nov 2021    Examination-Activity Limitations Locomotion Level;Sleep;Squat;Stairs;Stand;Transfers    Examination-Participation Restrictions Cleaning;Community  Activity;Shop;Volunteer;Yard Work    [employer] officer Evolving/Moderate complexity    Rehab Potential Good    PT Frequency 2x / week    PT Duration 8 weeks    PT Treatment/Interventions ADLs/Self Care Home Management;Cryotherapy;Moist Heat;Gait training;Stair training;Functional mobility training;Therapeutic activities;Therapeutic exercise;Balance training;Neuromuscular re-education;Patient/family education;Orthotic Fit/Training;Manual techniques;Passive range of motion;Dry needling;Energy conservation;Taping    PT Next Visit Plan manual therapy/LE strengthening/ROM    PT Home Exercise Plan will address next session    Consulted and Agree with Plan of Care Patient           Patient will benefit from skilled therapeutic intervention in order to improve the following deficits and impairments:  Abnormal gait,Decreased balance,Decreased endurance,Decreased mobility,Difficulty walking,Obesity,Decreased range of motion,Decreased activity tolerance,Decreased strength,Pain  Visit Diagnosis: Acute pain of right knee  Pain in left foot  Muscle weakness (generalized)  Pain in right foot  Unsteadiness on feet  Difficulty in walking, not elsewhere classified     Problem List Patient Active Problem List   Diagnosis Date Noted  . Left hip pain 07/10/2020  . Right knee pain 07/10/2020  . Hoarseness of voice 04/21/2020  . Skin tag 03/18/2020  . Trapezius strain, left, initial encounter 02/21/2020  . Left-sided chest pain 10/23/2019  . Overactive bladder 10/09/2019  . Lumbar sprain 03/28/2019  . Tinnitus, bilateral 11/28/2017  . Abnormal MRI of head 11/14/2017  . Meningioma ([place]) 09/21/2017  . PAF (paroxysmal atrial fibrillation) ([place]) 02/03/2017  . Preventative health care 06/02/2016  .  Advance directive discussed with patient 06/02/2016  . Achilles tendonitis 03/11/2016  . Hypertension   . DVT, lower extremity, recurrent ([place])   . Type 2 diabetes, controlled,  with peripheral neuropathy ([place])   . Generalized osteoarthritis of multiple sites   . GERD (gastroesophageal reflux disease)   . Hyperthyroidism   . Retinal hole of right eye 06/26/2014  . Amblyopia, right 05/08/2014  . Status post intraocular lens implant 05/08/2014   Karl Luke PT, DPT Netta Corrigan 09/30/2020, 2:38 PM  [place] MAIN [facility] SERVICES [address] [place], [place], [address] Phone: [phone]   Fax:  [phone]  Name: Lucetta Rine MRN: [id number] Date of Birth: 08/07/1941

## 2020-10-02 ENCOUNTER — Telehealth: Payer: Self-pay

## 2020-10-02 NOTE — Chronic Care Management (AMB) (Addendum)
Chronic Care Management Pharmacy Assistant   Name: Angela Bentley  MRN: [id number] DOB: [birth year]  Reason for Encounter: Disease State  Patient Questions:  1.  Have you seen any other providers since your last visit? No  2.  Any changes in your medicines or health? No  PCP : [name], MD  Allergies:   Allergies  Allergen Reactions   Penicillins     Has patient had a PCN reaction causing immediate rash, facial/tongue/throat swelling, SOB or lightheadedness with hypotension: Yes Has patient had a PCN reaction causing severe rash involving mucus membranes or skin necrosis: Yes Has patient had a PCN reaction that required hospitalization No Has patient had a PCN reaction occurring within the last 10 years: No If all of the above answers are "NO", then may proceed with Cephalosporin use.     Sulfa Antibiotics     Rash/itching   Azithromycin Palpitations    Medications: Outpatient Encounter Medications as of 10/02/2020  Medication Sig   diclofenac sodium (VOLTAREN) 1 % GEL Apply 4 g topically 4 (four) times daily. (Patient taking differently: Apply 4 g topically 4 (four) times daily as needed. )   fluticasone (VERAMYST) 27.5 MCG/SPRAY nasal spray Place 2 sprays into the nose daily as needed for allergies.    glimepiride (AMARYL) 1 MG tablet Take 1 tablet (1 mg total) by mouth daily with breakfast.   HYDROcodone-homatropine (HYCODAN) 5-1.5 MG/5ML syrup Take 5 mLs by mouth at bedtime as needed for cough.   lidocaine (LIDODERM) 5 % Place 1 patch onto the skin daily. Remove & Discard patch within 12 hours or as directed by MD   losartan-hydrochlorothiazide (HYZAAR) 100-12.5 MG tablet Take 1 tablet by mouth daily.   metoprolol succinate (TOPROL-XL) 25 MG 24 hr tablet Take 1 tablet (25 mg total) by mouth daily.   Multiple Vitamin (MULTIVITAMIN) tablet Take 1 tablet by mouth daily.   omeprazole (PRILOSEC) 20 MG capsule Take 1 capsule (20 mg total) by mouth daily.    pioglitazone (ACTOS) 45 MG tablet Take 1 tablet (45 mg total) by mouth daily.   rivaroxaban (XARELTO) 20 MG TABS tablet TAKE 1 TABLET (20 MG TOTAL) BY MOUTH DAILY WITH SUPPER.   triamcinolone ointment (KENALOG) 0.1 % Apply 1 application topically 2 (two) times daily. Avoid applying to face, groin, and axilla. Use as directed. Risk of skin atrophy with long-term use reviewed. (Patient not taking: Reported on 09/01/2020)   No facility-administered encounter medications on file as of 10/02/2020.    Current Diagnosis: Patient Active Problem List   Diagnosis Date Noted   Left hip pain 07/10/2020   Right knee pain 07/10/2020   Hoarseness of voice 04/21/2020   Skin tag 03/18/2020   Trapezius strain, left, initial encounter 02/21/2020   Left-sided chest pain 10/23/2019   Overactive bladder 10/09/2019   Lumbar sprain 03/28/2019   Tinnitus, bilateral 11/28/2017   Abnormal MRI of head 11/14/2017   Meningioma ([place]) 09/21/2017   PAF (paroxysmal atrial fibrillation) ([place]) 02/03/2017   Preventative health care 06/02/2016   Advance directive discussed with patient 06/02/2016   Achilles tendonitis 03/11/2016   Hypertension    DVT, lower extremity, recurrent ([place])    Type 2 diabetes, controlled, with peripheral neuropathy (HCC)    Generalized osteoarthritis of multiple sites    GERD (gastroesophageal reflux disease)    Hyperthyroidism    Retinal hole of right eye 06/26/2014   Amblyopia, right 05/08/2014   Status post intraocular lens implant 05/08/2014    Since  last visit with CPP, no interventions have been made. The patient has not had an ED visit since their last CPP follow up.The patient's current Chronic and PDC medications are: Glimepiride 1 mg 1 tablet daily, losartan-hydrochlorothiazide 100-12.5 mg 1 tablet daily, pioglitazone 45 mg 1 tablet daily. The patient has not had problems with their health recently. The patient denies any problems with their pharmacy. The patient has not had any side  effects with their medicines. The patient has no recommendations for improvements in managing care. Ms. Angela Bentley states that she does not miss any doses of medications that she is aware of. States that she has about 2-3 weeks extra of her medications at this time due to the "pharmacy sending extras". She is awaiting a delivery within the next day or so for her medications from [facility].                         Follow-Up:  Pharmacist Review   [name], CPP notified  Margaretmary Dys, [place] Assistant [phone]  I have reviewed the care management and care coordination activities outlined in this encounter and I am certifying that I agree with the content of this note. No further action required.  [name], PharmD Clinical Pharmacist [facility] [phone]

## 2020-10-06 ENCOUNTER — Ambulatory Visit: Payer: Medicare Other | Admitting: Physical Therapy

## 2020-10-07 ENCOUNTER — Ambulatory Visit (INDEPENDENT_AMBULATORY_CARE_PROVIDER_SITE_OTHER): Payer: Medicare Other

## 2020-10-07 ENCOUNTER — Encounter: Payer: Self-pay | Admitting: Orthopedic Surgery

## 2020-10-07 ENCOUNTER — Ambulatory Visit (INDEPENDENT_AMBULATORY_CARE_PROVIDER_SITE_OTHER): Payer: Medicare Other | Admitting: Orthopedic Surgery

## 2020-10-07 ENCOUNTER — Telehealth: Payer: Self-pay

## 2020-10-07 ENCOUNTER — Other Ambulatory Visit: Payer: Self-pay

## 2020-10-07 DIAGNOSIS — M25561 Pain in right knee: Secondary | ICD-10-CM

## 2020-10-07 DIAGNOSIS — G8929 Other chronic pain: Secondary | ICD-10-CM | POA: Diagnosis not present

## 2020-10-07 DIAGNOSIS — M1711 Unilateral primary osteoarthritis, right knee: Secondary | ICD-10-CM

## 2020-10-07 NOTE — Telephone Encounter (Signed)
Patient wanting auth for same gel injection that she had previously

## 2020-10-08 ENCOUNTER — Ambulatory Visit: Payer: Medicare Other | Admitting: Physical Therapy

## 2020-10-08 ENCOUNTER — Encounter: Payer: Self-pay | Admitting: Physical Therapy

## 2020-10-08 DIAGNOSIS — M6281 Muscle weakness (generalized): Secondary | ICD-10-CM

## 2020-10-08 DIAGNOSIS — M25561 Pain in right knee: Secondary | ICD-10-CM

## 2020-10-08 DIAGNOSIS — R2681 Unsteadiness on feet: Secondary | ICD-10-CM

## 2020-10-08 NOTE — Therapy (Signed)
[place] MAIN [facility] SERVICES [address] [place], [place], [address] Phone: [phone]   Fax:  [phone]  Physical Therapy Treatment/Cancellation  Patient Details  Name: Angela Bentley MRN: [id number] Date of Birth: 03-23-1941 Referring Provider (PT): Loura Pardon, MD   Encounter Date: 10/08/2020   PT End of Session - 10/08/20 1441    Visit Number 9    Number of Visits 17    Date for PT Re-Evaluation 10/19/20    PT Start Time 1100    PT Stop Time 1110    PT Time Calculation (min) 10 min    Activity Tolerance Patient tolerated treatment well;No increased pain    Behavior During Therapy WFL for tasks assessed/performed           Past Medical History:  Diagnosis Date  . Balance problems   . Brain tumor ([place])    a. Left intraventricular tumor ->stable by 06/2017 MRI. Followed @ [facility].  . Gait disturbance    a. Unsteady on feet/balance difficulty.  . Generalized osteoarthritis of multiple sites   . GERD (gastroesophageal reflux disease)   . History of DVT (deep vein thrombosis) 2016   estrogen and airflights. Rx with xarelto for 3 months  . History of stress test    a. 11/2016 ETT: No ST/T changes.  Performed to assess PAC/PVC burden with activity ->No ectopy noted.  . Hypertension   . Hyperthyroidism    treated briefly in college  . Insomnia   . Obesity   . PAF (paroxysmal atrial fibrillation) ([place])    a. 12/2016 Event Monitor: brief run of PAF-->CHA2DS2VASc = 5--> Xarelto.  . Plantar fasciitis   . Retinal tear of left eye   . Rosacea   . Squamous cell carcinoma of skin 09/07/2017   R dorsum of hand   . Type 2 diabetes, controlled, with peripheral neuropathy ([place])    a. 11/2016 A1c 7.4.  . Uterine fibroid     Past Surgical History:  Procedure Laterality Date  . CARPAL TUNNEL RELEASE Right    Dr Burney Gauze  . CATARACT EXTRACTION    . 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 RETINAL TEAR REPAIR CRYOTHERAPY     10/09/13, then again 3/15    There were no vitals filed for this visit.   Subjective Assessment - 10/08/20 1106    Subjective Patient reports feeling better than earlier in the week; She did see Dr. Marlou Sa who did not recommend surgery, She reports he said she could have steroid 3x a year and then haluronic acid shot after insurance approval. She did get a steroid injection in right knee yesterday with good tolerance;    Pertinent History 80 yo Female reports recent new onset of LLE hip pain which started mid October 2021. As a result she is having increased RLE knee pain. She did get X-rays of left hip which shows no arthritis or acute injury. She reports since the hip started hurting it has actually gotten better, but now the right knee is bothering her more and she is having more of a balance issue. She had a gamma knife procedure for Meningioma on 08/14/20; She reports since having the procedure she has noticed shuffling steps and unsteady gait. She has been using tripod base cane due to hip/knee pain and has continued using it for balance/stability; She states side effects from gamma knife procedure is possible brain swelling from 6 months to 3 years after  procedure. She reports occasional headaches; She is still having ringing in the ears on left side which is chronic. She also denies any change in vision;    How long can you sit comfortably? NA    How long can you stand comfortably? will having some pain with initial standing, but then as she moves around its okay    How long can you walk comfortably? 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 There are heterogeneous areas  of enhancement of the tumor and different lobules. Differential includes primarily an intraventricular meningioma. However with the enlargement consideration is also given for a tumor of the choroid plexus such as choroid plexus papilloma although less commonly seen in the lateral ventricles in adult patients, and other tumors. The growth appears slow for a cord plexus carcinoma.    Patient Stated Goals "What is going on with the knee, see if I need surgery"    Currently in Pain? Yes    Pain Score 1     Pain Location Knee    Pain Orientation Right    Pain Descriptors / Indicators Aching;Sore    Pain Type Acute pain    Pain Onset 1 to 4 weeks ago    Pain Frequency Intermittent    Aggravating Factors  worse at night                Patient presents to therapy following orthopedic visit yesterday with Dr. Marlou Sa. Patient is s/p steroid injection yesterday in right knee. Recommend therapy visit be cancelled as activity should be limited for 24-48 hours following steroid injection for better tolerance and improved response of injection. Patient verbalized agreement. Recommend patient continue with her usual activities but to avoid knee HEP for the next day to allow steroid injection to take full effect.                       PT Short Term Goals - 08/25/20 1156      PT SHORT TERM GOAL #1   Title Patient will be adherent to HEP at least 3x a week to improve functional strength and balance for better safety at home.    Time 4    Period Weeks    Status New    Target Date 09/22/20      PT SHORT TERM GOAL #2   Title Patient will increase RLE knee AROM extension to within 5 degrees of neutral for better standing and walking tolerance;     Time 4    Period Weeks    Status New    Target Date 09/22/20      PT SHORT TERM GOAL #3   Title Patient will reports maximum of 1 sleep disturbances per night over last 3 days to exhibit improved sleeping tolerance with less knee pain  and reduced fatigue.     Time 4    Period Weeks    Status New    Target Date 09/22/20             PT Long Term Goals - 08/25/20 1157      PT LONG TERM GOAL #1   Title Patient will increase BLE gross strength to 4+/5 as to  improve functional strength for independent gait, increased standing tolerance and increased ADL ability.    Time 8    Period Weeks    Status New    Target Date 10/20/20      PT LONG TERM GOAL #2   Title Patient will improve FOTO score to >55/100 to indicate improved functional mobility with ADLs.    Time 8    Period Weeks    Status New    Target Date 10/20/20      PT LONG TERM GOAL #3   Title Patient will report a worst pain of 3/10 on VAS in      right knee       to improve tolerance with ADLs and reduced symptoms with activities.     Time 8    Period Weeks    Status New    Target Date 10/20/20      PT LONG TERM GOAL #4   Title Patient will complete >1000 feet on 6 min walk test without increase in knee pain to improve community ambulator distance for functional tasks and return to PLOF.    Time 8    Period Weeks    Status New    Target Date 10/20/20      PT LONG TERM GOAL #5   Title Patient will increase 10 meter walk test to >1.67m/s as to improve gait speed for better community ambulation and to reduce fall risk.    Time 8    Period Weeks    Status New    Target Date 10/20/20                  Patient will benefit from skilled therapeutic intervention in order to improve the following deficits and impairments:     Visit Diagnosis: Acute pain of right knee  Muscle weakness (generalized)  Unsteadiness on feet     Problem List Patient Active Problem List   Diagnosis Date Noted  . Left hip pain 07/10/2020  . Right knee pain 07/10/2020  . Hoarseness of voice 04/21/2020  . Skin tag 03/18/2020  . Trapezius strain, left, initial encounter 02/21/2020  . Left-sided chest pain 10/23/2019  . Overactive bladder 10/09/2019  .  Lumbar sprain 03/28/2019  . Tinnitus, bilateral 11/28/2017  . Abnormal MRI of head 11/14/2017  . Meningioma ([place]) 09/21/2017  . PAF (paroxysmal atrial fibrillation) ([place]) 02/03/2017  . Preventative health care 06/02/2016  . Advance directive discussed with patient 06/02/2016  . Achilles tendonitis 03/11/2016  . Hypertension   . DVT, lower extremity, recurrent ([place])   . Type 2 diabetes, controlled, with peripheral neuropathy ([place])   . Generalized osteoarthritis of multiple sites   . GERD (gastroesophageal reflux disease)   . Hyperthyroidism   . Retinal hole of right eye 06/26/2014  . Amblyopia, right 05/08/2014  . Status post intraocular lens implant 05/08/2014    Alexiz Sustaita PT, DPT 10/08/2020, 2:42 PM  [place] MAIN [facility] SERVICES [address] [place], [place], [address] Phone: [phone]   Fax:  [phone]  Name: Angela Bentley MRN: [id number] Date of Birth: 1941-08-12

## 2020-10-08 NOTE — Telephone Encounter (Signed)
Noted  

## 2020-10-10 ENCOUNTER — Encounter: Payer: Self-pay | Admitting: Orthopedic Surgery

## 2020-10-10 DIAGNOSIS — M1711 Unilateral primary osteoarthritis, right knee: Secondary | ICD-10-CM

## 2020-10-10 MED ORDER — LIDOCAINE HCL 1 % IJ SOLN
5.0000 mL | INTRAMUSCULAR | Status: AC | PRN
  Administered 2020-10-10: 5 mL

## 2020-10-10 MED ORDER — METHYLPREDNISOLONE ACETATE 40 MG/ML IJ SUSP
40.0000 mg | INTRAMUSCULAR | Status: AC | PRN
  Administered 2020-10-10: 40 mg via INTRA_ARTICULAR

## 2020-10-10 MED ORDER — BUPIVACAINE HCL 0.25 % IJ SOLN
4.0000 mL | INTRAMUSCULAR | Status: AC | PRN
  Administered 2020-10-10: 4 mL via INTRA_ARTICULAR

## 2020-10-10 NOTE — Progress Notes (Signed)
Office Visit Note   Patient: Angela Bentley           Date of Birth: 09/14/1941           MRN: [id number] Visit Date: 10/07/2020 Requested by: [name], MD [place],  [place] [id number] PCP: [name], MD  Subjective: Chief Complaint  Patient presents with  . Right Knee - Pain    HPI: Angela Bentley is a 80 year old patient with right knee pain.  Has been limping for about 2 months.  Had injection 321 and did well with that.  Since we have last seen her she has had gamma knife brain tumor surgery.  Her knee was fine after the hyaluronic acid February injection.  She does report some recurrence of weightbearing pain and some night pain.  Takes Tylenol for symptoms.              ROS: All systems reviewed are negative as they relate to the chief complaint within the history of present illness.  Patient denies  fevers or chills.   Assessment & Plan: Visit Diagnoses:  1. Chronic pain of right knee     Plan: Impression is right knee pain with some lateral compartment arthritis but medial sided tenderness.  Patient has mild effusion.  I would favor aspiration and injection today with cortisone and then in 8 weeks preapproved for gel injection and do that injection.  She did have good sustained pain relief with the original injection and required less over-the-counter pain medicine and had better function with ADLs until recently.  The initial hyaluronic acid was a success in that regard. This patient is diagnosed with osteoarthritis of the knee(s).    Radiographs show evidence of joint space narrowing, osteophytes, subchondral sclerosis and/or subchondral cysts.  This patient has knee pain which interferes with functional and activities of daily living.    This patient has experienced inadequate response, adverse effects and/or intolerance with conservative treatments such as acetaminophen, NSAIDS, topical creams, physical therapy or regular exercise, knee bracing  and/or weight loss.   This patient has experienced inadequate response or has a contraindication to intra articular steroid injections for at least 3 months.   This patient is not scheduled to have a total knee replacement within 6 months of starting treatment with viscosupplementation.   Follow-Up Instructions: Return in about 8 weeks (around 12/02/2020).   Orders:  Orders Placed This Encounter  Procedures  . XR KNEE 3 VIEW RIGHT   No orders of the defined types were placed in this encounter.     Procedures: Large Joint Inj: R knee on 10/10/2020 6:55 PM Indications: diagnostic evaluation, joint swelling and pain Details: 18 G 1.5 in needle, superolateral approach  Arthrogram: No  Medications: 5 mL lidocaine 1 %; 40 mg methylPREDNISolone acetate 40 MG/ML; 4 mL bupivacaine 0.25 % Outcome: tolerated well, no immediate complications Procedure, treatment alternatives, risks and benefits explained, specific risks discussed. Consent was given by the patient. Immediately prior to procedure a time out was called to verify the correct patient, procedure, equipment, support staff and site/side marked as required. Patient was prepped and draped in the usual sterile fashion.       Clinical Data: No additional findings.  Objective: Vital Signs: LMP 09/26/1997   Physical Exam:   Constitutional: Patient appears well-developed HEENT:  Head: Normocephalic Eyes:EOM are normal Neck: Normal range of motion Cardiovascular: Normal rate Pulmonary/chest: Effort normal Neurologic: Patient is alert Skin: Skin is warm Psychiatric: Patient  has normal mood and affect    Ortho Exam: Ortho exam demonstrates full active and passive range of motion of the right knee.  She has medial greater than lateral joint line tenderness.  Mild effusion is present.  Collateral crucial ligaments are stable.  No calf tenderness negative Homans.  Pedal pulses palpable.  No other masses lymphadenopathy or skin  changes noted in the right knee region.  Specialty Comments:  No specialty comments available.  Imaging: No results found.   PMFS History: Patient Active Problem List   Diagnosis Date Noted  . Left hip pain 07/10/2020  . Right knee pain 07/10/2020  . Hoarseness of voice 04/21/2020  . Skin tag 03/18/2020  . Trapezius strain, left, initial encounter 02/21/2020  . Left-sided chest pain 10/23/2019  . Overactive bladder 10/09/2019  . Lumbar sprain 03/28/2019  . Tinnitus, bilateral 11/28/2017  . Abnormal MRI of head 11/14/2017  . Meningioma ([place]) 09/21/2017  . PAF (paroxysmal atrial fibrillation) ([place]) 02/03/2017  . Preventative health care 06/02/2016  . Advance directive discussed with patient 06/02/2016  . Achilles tendonitis 03/11/2016  . Hypertension   . DVT, lower extremity, recurrent ([place])   . Type 2 diabetes, controlled, with peripheral neuropathy ([place])   . Generalized osteoarthritis of multiple sites   . GERD (gastroesophageal reflux disease)   . Hyperthyroidism   . Retinal hole of right eye 06/26/2014  . Amblyopia, right 05/08/2014  . Status post intraocular lens implant 05/08/2014   Past Medical History:  Diagnosis Date  . Balance problems   . Brain tumor ([place])    a. Left intraventricular tumor ->stable by 06/2017 MRI. Followed @ [facility].  . Gait disturbance    a. Unsteady on feet/balance difficulty.  . Generalized osteoarthritis of multiple sites   . GERD (gastroesophageal reflux disease)   . History of DVT (deep vein thrombosis) 2016   estrogen and airflights. Rx with xarelto for 3 months  . History of stress test    a. 11/2016 ETT: No ST/T changes.  Performed to assess PAC/PVC burden with activity ->No ectopy noted.  . Hypertension   . Hyperthyroidism    treated briefly in college  . Insomnia   . Obesity   . PAF (paroxysmal atrial fibrillation) ([place])    a. 12/2016 Event Monitor: brief run of PAF-->CHA2DS2VASc = 5--> Xarelto.  . Plantar fasciitis   . Retinal  tear of left eye   . Rosacea   . Squamous cell carcinoma of skin 09/07/2017   R dorsum of hand   . Type 2 diabetes, controlled, with peripheral neuropathy ([place])    a. 11/2016 A1c 7.4.  . Uterine fibroid     Family History  Problem Relation Age of Onset  . Diabetes Father   . Pancreatic cancer Father   . Diabetes Other        grandmother    Past Surgical History:  Procedure Laterality Date  . CARPAL TUNNEL RELEASE Right    Dr Burney Gauze  . CATARACT EXTRACTION    . 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 RETINAL TEAR REPAIR CRYOTHERAPY     10/09/13, then again 3/15   Social History   Occupational History  . Occupation: Non Patent attorney    Comment: [employer] and others  Tobacco Use  . Smoking status: Former Smoker    Quit date: 09/26/1993    Years since quitting: 27.0  . Smokeless tobacco: Never Used  . Tobacco comment: quit in [place]  Use  . Vaping Use: Never used  Substance and Sexual Activity  . Alcohol use: Yes    Alcohol/week: 0.0 - 1.0 standard drinks  . Drug use: No  . Sexual activity: Not Currently    Partners: Male

## 2020-10-13 ENCOUNTER — Encounter: Payer: Medicare Other | Admitting: Physical Therapy

## 2020-10-14 ENCOUNTER — Ambulatory Visit (INDEPENDENT_AMBULATORY_CARE_PROVIDER_SITE_OTHER): Payer: Medicare Other | Admitting: Internal Medicine

## 2020-10-14 ENCOUNTER — Encounter: Payer: Self-pay | Admitting: Internal Medicine

## 2020-10-14 ENCOUNTER — Other Ambulatory Visit: Payer: Self-pay

## 2020-10-14 VITALS — BP 140/74 | HR 81 | Temp 97.2°F | Ht 65.5 in | Wt 200.0 lb

## 2020-10-14 DIAGNOSIS — D329 Benign neoplasm of meninges, unspecified: Secondary | ICD-10-CM | POA: Diagnosis not present

## 2020-10-14 DIAGNOSIS — M159 Polyosteoarthritis, unspecified: Secondary | ICD-10-CM | POA: Diagnosis not present

## 2020-10-14 DIAGNOSIS — I10 Essential (primary) hypertension: Secondary | ICD-10-CM

## 2020-10-14 DIAGNOSIS — Z0000 Encounter for general adult medical examination without abnormal findings: Secondary | ICD-10-CM

## 2020-10-14 DIAGNOSIS — E1142 Type 2 diabetes mellitus with diabetic polyneuropathy: Secondary | ICD-10-CM | POA: Diagnosis not present

## 2020-10-14 DIAGNOSIS — I480 Paroxysmal atrial fibrillation: Secondary | ICD-10-CM | POA: Diagnosis not present

## 2020-10-14 DIAGNOSIS — Z7189 Other specified counseling: Secondary | ICD-10-CM | POA: Diagnosis not present

## 2020-10-14 LAB — HEMOGLOBIN A1C: Hgb A1c MFr Bld: 7.6 % — ABNORMAL HIGH (ref 4.6–6.5)

## 2020-10-14 LAB — COMPREHENSIVE METABOLIC PANEL
ALT: 13 U/L (ref 0–35)
AST: 14 U/L (ref 0–37)
Albumin: 4.5 g/dL (ref 3.5–5.2)
Alkaline Phosphatase: 65 U/L (ref 39–117)
BUN: 15 mg/dL (ref 6–23)
CO2: 31 mEq/L (ref 19–32)
Calcium: 9.6 mg/dL (ref 8.4–10.5)
Chloride: 102 mEq/L (ref 96–112)
Creatinine, Ser: 0.82 mg/dL (ref 0.40–1.20)
GFR: 67.2 mL/min (ref >60.00)
Glucose, Bld: 183 mg/dL — ABNORMAL HIGH (ref 70–99)
Potassium: 3.9 mEq/L (ref 3.5–5.1)
Sodium: 140 mEq/L (ref 135–145)
Total Bilirubin: 0.5 mg/dL (ref 0.2–1.2)
Total Protein: 6.7 g/dL (ref 6.0–8.3)

## 2020-10-14 LAB — CBC
HCT: 36.8 % (ref 36.0–46.0)
Hemoglobin: 12.4 g/dL (ref 12.0–15.0)
MCHC: 33.7 g/dL (ref 30.0–36.0)
MCV: 86.9 fl (ref 78.0–100.0)
Platelets: 224 10*3/uL (ref 150.0–400.0)
RBC: 4.24 Mil/uL (ref 3.87–5.11)
RDW: 14.2 % (ref 11.5–15.5)
WBC: 7.9 10*3/uL (ref 4.0–10.5)

## 2020-10-14 LAB — LIPID PANEL
Cholesterol: 174 mg/dL (ref 0–200)
HDL: 48.8 mg/dL (ref >39.00)
LDL Cholesterol: 91 mg/dL (ref 0–99)
NonHDL: 124.85
Total CHOL/HDL Ratio: 4
Triglycerides: 167 mg/dL — ABNORMAL HIGH (ref 0.0–149.0)
VLDL: 33.4 mg/dL (ref 0.0–40.0)

## 2020-10-14 LAB — T4, FREE: Free T4: 0.96 ng/dL (ref 0.60–1.60)

## 2020-10-14 NOTE — Assessment & Plan Note (Signed)
Urged her to test at least monthly Very early neuropathy Continues on glimepiride and pioglitazone

## 2020-10-14 NOTE — Assessment & Plan Note (Signed)
Still regular on metoprolol On the xarelto

## 2020-10-14 NOTE — Assessment & Plan Note (Signed)
Had gamma knife Rx Will get her follow up

## 2020-10-14 NOTE — Progress Notes (Signed)
Subjective:    Patient ID: Angela Bentley, female    DOB: [birth year], 80 y.o.   MRN: [id number]  HPI Here for Medicare wellness visit and follow up of chronic health conditions This visit occurred during the SARS-CoV-2 public health emergency.  Safety protocols were in place, including screening questions prior to the visit, additional usage of staff PPE, and extensive cleaning of exam room while observing appropriate contact time as indicated for disinfecting solutions.   Reviewed form and advanced directives Reviewed other doctors Occasional alcohol No tobacco  Tries to walk regularly--but not much lately due to knee pain No falls No depression or anhedonia--she actually enjoys the [place] is okay.  Hearing is good Independent with instrumental ADLs  Had gamma knife therapy for meningioma Went well other than the "nervousess" Will get follow up in 6 months No headaches  She feels her memory is worse Feels "spacy" at times Takes a while to answer questions at times Reluctant to drive now---now driving very little (husband drives her most of the time)  Hasn't checked sugars She thinks they are probably okay Mild sensory changes on plantar feet---not numb, just different No apparent low sugar reactions  No palpitations No chest pain or SOB No dizziness or syncope No edema  Current Outpatient Medications on File Prior to Visit  Medication Sig Dispense Refill  . diclofenac sodium (VOLTAREN) 1 % GEL Apply 4 g topically 4 (four) times daily. (Patient taking differently: Apply 4 g topically 4 (four) times daily as needed.) 100 g 2  . fluticasone (VERAMYST) 27.5 MCG/SPRAY nasal spray Place 2 sprays into the nose daily as needed for allergies.     Marland Kitchen glimepiride (AMARYL) 1 MG tablet Take 1 tablet (1 mg total) by mouth daily with breakfast. 90 tablet 3  . HYDROcodone-homatropine (HYCODAN) 5-1.5 MG/5ML syrup Take 5 mLs by mouth at bedtime as needed for cough. 120 mL  0  . losartan-hydrochlorothiazide (HYZAAR) 100-12.5 MG tablet Take 1 tablet by mouth daily. 90 tablet 3  . metoprolol succinate (TOPROL-XL) 25 MG 24 hr tablet Take 1 tablet (25 mg total) by mouth daily. 90 tablet 3  . Multiple Vitamin (MULTIVITAMIN) tablet Take 1 tablet by mouth daily.    Marland Kitchen omeprazole (PRILOSEC) 20 MG capsule Take 1 capsule (20 mg total) by mouth daily. 90 capsule 3  . pioglitazone (ACTOS) 45 MG tablet Take 1 tablet (45 mg total) by mouth daily. 90 tablet 3  . rivaroxaban (XARELTO) 20 MG TABS tablet TAKE 1 TABLET (20 MG TOTAL) BY MOUTH DAILY WITH SUPPER. 90 tablet 3   No current facility-administered medications on file prior to visit.    Allergies  Allergen Reactions  . Penicillins     Has patient had a PCN reaction causing immediate rash, facial/tongue/throat swelling, SOB or lightheadedness with hypotension: Yes Has patient had a PCN reaction causing severe rash involving mucus membranes or skin necrosis: Yes Has patient had a PCN reaction that required hospitalization No Has patient had a PCN reaction occurring within the last 10 years: No If all of the above answers are "NO", then may proceed with Cephalosporin use.    . Sulfa Antibiotics     Rash/itching  . Azithromycin Palpitations    Past Medical History:  Diagnosis Date  . Balance problems   . Brain tumor ([place])    a. Left intraventricular tumor ->stable by 06/2017 MRI. Followed @ [facility].  . Gait disturbance    a. Unsteady on feet/balance difficulty.  . Generalized  osteoarthritis of multiple sites   . GERD (gastroesophageal reflux disease)   . History of DVT (deep vein thrombosis) 2016   estrogen and airflights. Rx with xarelto for 3 months  . History of stress test    a. 11/2016 ETT: No ST/T changes.  Performed to assess PAC/PVC burden with activity ->No ectopy noted.  . Hypertension   . Hyperthyroidism    treated briefly in college  . Insomnia   . Obesity   . PAF (paroxysmal atrial fibrillation)  ([place])    a. 12/2016 Event Monitor: brief run of PAF-->CHA2DS2VASc = 5--> Xarelto.  . Plantar fasciitis   . Retinal tear of left eye   . Rosacea   . Squamous cell carcinoma of skin 09/07/2017   R dorsum of hand   . Type 2 diabetes, controlled, with peripheral neuropathy ([place])    a. 11/2016 A1c 7.4.  . Uterine fibroid     Past Surgical History:  Procedure Laterality Date  . CARPAL TUNNEL RELEASE Right    Dr Burney Gauze  . CATARACT EXTRACTION    . 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 RETINAL TEAR REPAIR CRYOTHERAPY     10/09/13, then again 3/15    Family History  Problem Relation Age of Onset  . Diabetes Father   . Pancreatic cancer Father   . Diabetes Other        grandmother    Social History   Socioeconomic History  . Marital status: Married    Spouse name: Not on file  . Number of children: 3  . Years of education: Not on file  . Highest education level: Not on file  Occupational History  . Occupation: Non Patent attorney    Comment: [employer] and others  Tobacco Use  . Smoking status: Former Smoker    Quit date: 09/26/1993    Years since quitting: 27.0  . Smokeless tobacco: Never Used  . Tobacco comment: quit in 1996  Vaping Use  . Vaping Use: Never used  Substance and Sexual Activity  . Alcohol use: Yes    Alcohol/week: 0.0 - 1.0 standard drinks  . Drug use: No  . Sexual activity: Not Currently    Partners: Male  Other Topics Concern  . Not on file  Social History Narrative   Has living will   Husband is health care POA-- or daughter Izora Gala   Would accept resuscitation attempts   Would not want tube feeds if cognitively unaware   Social Determinants of Health   Financial Resource Strain: Low Risk   . Difficulty of Paying Living Expenses: Not very hard  Food Insecurity: Not on file  Transportation Needs: Not on file  Physical Activity: Not on file  Stress: Not on file  Social Connections: Not on file   Intimate Partner Violence: Not on file   Review of Systems  Some trouble sleeping---can't get back to sleep after awakening Will take a nap if needed Got cortisone shot in knee from Dr Marlou Sa last week Appetite is good Weight is fairly stable---lost 10# with the gamma knife routine, but gained it back Wears seat belt No heartburn now---no dysphagia. Hoarseness is better now. Hasn't been using the omeprazole lately Bowels are fine--no blood Still has urinary frequency at night--when awake. No problems in the day No rash or suspicious lesions. Does bruise easily on the xarelto     Objective:   Physical Exam Constitutional:      Appearance: Normal appearance.  HENT:     Mouth/Throat:     Comments: No lesions Eyes:     Conjunctiva/sclera: Conjunctivae normal.     Pupils: Pupils are equal, round, and reactive to light.  Cardiovascular:     Rate and Rhythm: Normal rate and regular rhythm.     Heart sounds: No murmur heard. No gallop.      Comments: Faint pedal pulses Pulmonary:     Effort: Pulmonary effort is normal.     Breath sounds: Normal breath sounds. No wheezing or rales.  Abdominal:     Palpations: Abdomen is soft.     Tenderness: There is no abdominal tenderness.  Musculoskeletal:     Cervical back: Neck supple.     Comments: Trace ankle edema  Skin:    General: Skin is warm.     Findings: No rash.     Comments: No foot lesions  Neurological:     Mental Status: She is alert and oriented to person, place, and time.     Comments: President--- "Waymond Cera, ?" [phone] [id number] Recall 2/3  Normal fine touch sensation in feet  Psychiatric:        Mood and Affect: Mood normal.        Behavior: Behavior normal.            Assessment & Plan:

## 2020-10-14 NOTE — Assessment & Plan Note (Signed)
Recent knee injection--might affect her sugar control

## 2020-10-14 NOTE — Assessment & Plan Note (Signed)
BP Readings from Last 3 Encounters:  10/14/20 140/74  07/10/20 124/76  04/21/20 122/68   Fair control on metoprolol and losartan/HCTZ Doesn't want statin---after discussion

## 2020-10-14 NOTE — Assessment & Plan Note (Signed)
I have personally reviewed the Medicare Annual Wellness questionnaire and have noted 1. The patient's medical and social history 2. Their use of alcohol, tobacco or illicit drugs 3. Their current medications and supplements 4. The patient's functional ability including ADL's, fall risks, home safety risks and hearing or visual             impairment. 5. Diet and physical activities 6. Evidence for depression or mood disorders  The patients weight, height, BMI and visual acuity have been recorded in the chart I have made referrals, counseling and provided education to the patient based review of the above and I have provided the pt with a written personalized care plan for preventive services.  I have provided you with a copy of your personalized plan for preventive services. Please take the time to review along with your updated medication list.  Done with cancer screening Had COVID booster and flu vaccine Discussed increased exercise---focus on resistance

## 2020-10-14 NOTE — Progress Notes (Signed)
Hearing Screening   125Hz  250Hz  500Hz  1000Hz  2000Hz  3000Hz  4000Hz  6000Hz  8000Hz   Right ear:           Left ear:           Comments: May 2021  Vision Screening Comments: May 2021

## 2020-10-14 NOTE — Assessment & Plan Note (Signed)
See social history 

## 2020-10-15 ENCOUNTER — Ambulatory Visit: Payer: Medicare Other

## 2020-10-15 DIAGNOSIS — M25561 Pain in right knee: Secondary | ICD-10-CM | POA: Diagnosis not present

## 2020-10-15 DIAGNOSIS — R262 Difficulty in walking, not elsewhere classified: Secondary | ICD-10-CM

## 2020-10-15 DIAGNOSIS — M79672 Pain in left foot: Secondary | ICD-10-CM | POA: Diagnosis not present

## 2020-10-15 DIAGNOSIS — M79671 Pain in right foot: Secondary | ICD-10-CM

## 2020-10-15 DIAGNOSIS — R2681 Unsteadiness on feet: Secondary | ICD-10-CM | POA: Diagnosis not present

## 2020-10-15 DIAGNOSIS — M6281 Muscle weakness (generalized): Secondary | ICD-10-CM | POA: Diagnosis not present

## 2020-10-15 DIAGNOSIS — G8929 Other chronic pain: Secondary | ICD-10-CM | POA: Diagnosis not present

## 2020-10-15 NOTE — Therapy (Signed)
[place] MAIN [facility] SERVICES [address] [place], [place], [address] Phone: [phone]   Fax:  [phone]  Physical Therapy Treatment  Patient Details  Name: Angela Bentley MRN: [id number] Date of Birth: 04/15/1941 Referring Provider (PT): Loura Pardon, MD   Encounter Date: 10/15/2020   PT End of Session - 10/15/20 1319    Visit Number 9    Number of Visits 17    Date for PT Re-Evaluation 10/19/20    PT Start Time 1310    PT Stop Time 1345    PT Time Calculation (min) 35 min    Equipment Utilized During Treatment Gait belt    Activity Tolerance Patient tolerated treatment well;No increased pain    Behavior During Therapy WFL for tasks assessed/performed           Past Medical History:  Diagnosis Date  . Balance problems   . Brain tumor ([place])    a. Left intraventricular tumor ->stable by 06/2017 MRI. Followed @ [facility].  . Gait disturbance    a. Unsteady on feet/balance difficulty.  . Generalized osteoarthritis of multiple sites   . GERD (gastroesophageal reflux disease)   . History of DVT (deep vein thrombosis) 2016   estrogen and airflights. Rx with xarelto for 3 months  . History of stress test    a. 11/2016 ETT: No ST/T changes.  Performed to assess PAC/PVC burden with activity ->No ectopy noted.  . Hypertension   . Hyperthyroidism    treated briefly in college  . Insomnia   . Obesity   . PAF (paroxysmal atrial fibrillation) ([place])    a. 12/2016 Event Monitor: brief run of PAF-->CHA2DS2VASc = 5--> Xarelto.  . Plantar fasciitis   . Retinal tear of left eye   . Rosacea   . Squamous cell carcinoma of skin 09/07/2017   R dorsum of hand   . Type 2 diabetes, controlled, with peripheral neuropathy ([place])    a. 11/2016 A1c 7.4.  . Uterine fibroid     Past Surgical History:  Procedure Laterality Date  . CARPAL TUNNEL RELEASE Right    Dr Burney Gauze  . CATARACT EXTRACTION    . Gamma knife  07/2020   intraventricular  mass---meningioma or choroid plexus papilloma  . RETINAL TEAR REPAIR CRYOTHERAPY     10/09/13, then again 3/15    There were no vitals filed for this visit.   Subjective Assessment - 10/15/20 1313    Subjective Patient reports feeling better with decreased right knee pain. Rates at a 1/10 today. Reports she feels the injection did help so far.    Pertinent History 80 yo Female reports recent new onset of LLE hip pain which started mid October 2021. As a result she is having increased RLE knee pain. She did get X-rays of left hip which shows no arthritis or acute injury. She reports since the hip started hurting it has actually gotten better, but now the right knee is bothering her more and she is having more of a balance issue. She had a gamma knife procedure for Meningioma on 08/14/20; She reports since having the procedure she has noticed shuffling steps and unsteady gait. She has been using tripod base cane due to hip/knee pain and has continued using it for balance/stability; She states side effects from gamma knife procedure is possible brain swelling from 6 months to 3 years after procedure. She reports occasional headaches; She is still having ringing in the ears on left side which is chronic.  She also denies any change in vision;    How long can you sit comfortably? NA    How long can you stand comfortably? will having some pain with initial standing, but then as she moves around its okay    How long can you walk comfortably? 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 There are heterogeneous areas of enhancement of the tumor and different lobules. Differential includes primarily an intraventricular meningioma.  However with the enlargement consideration is also given for a tumor of the choroid plexus such as choroid plexus papilloma although less commonly seen in the lateral ventricles in adult patients, and other tumors. The growth appears slow for a cord plexus carcinoma.    Patient Stated Goals "What is going on with the knee, see if I need surgery"    Currently in Pain? Yes    Pain Score 1     Pain Location Knee    Pain Orientation Right    Pain Descriptors / Indicators Aching    Pain Type Acute pain    Pain Onset 1 to 4 weeks ago    Pain Frequency Intermittent    Pain Relieving Factors rest    Multiple Pain Sites No           Visit # should be reflected as  9 today as patient was not billed for last visit   Pre treatment measures:  Right Knee flex AROM: 118 deg (supine) *1/10 pain   Manual Therapy: Joint mobilization to right knee: Tibiofemoral AP in varying increasing flex ROM- grade II-III 2 sets of 20 bouts- No pain reported Tibiofemoral PA in varying increasing flex ROM- grade II-III 2 sets of 20 bouts- No pain reported Tibiofemoral PA glides in seated position with right leg hanging off the mat - Grade II-III 2 sets of 20 bouts Tibiofemoral AP glides in seated position with right leg hanging off the mat- Grade II-III 2 sets of 20 bouts  Gait trainer for 2 min ([employer] treadmill)- 0.43 m/s with cues from trainer to increase step length on both feet. Avg time on left foot= 51% and on left = 49%. Patient with no loss of balance and no report of pain yet unable to increase step length to desired length during. Patient required cues for sequencing, CGA and gait belt for safety.                      PT Education - 10/15/20 1523    Education Details Instructed in purpose of Manual therapy, Review of current HEP    Person(s) Educated Patient    Methods Explanation;Demonstration;Verbal cues    Comprehension Verbalized understanding;Returned demonstration;Need further  instruction;Verbal cues required            PT Short Term Goals - 08/25/20 1156      PT SHORT TERM GOAL #1   Title Patient will be adherent to HEP at least 3x a week to improve functional strength and balance for better safety at home.    Time 4    Period Weeks    Status New    Target Date 09/22/20      PT SHORT TERM GOAL #2   Title Patient  will increase RLE knee AROM extension to within 5 degrees of neutral for better standing and walking tolerance;     Time 4    Period Weeks    Status New    Target Date 09/22/20      PT SHORT TERM GOAL #3   Title Patient will reports maximum of 1 sleep disturbances per night over last 3 days to exhibit improved sleeping tolerance with less knee pain and reduced fatigue.     Time 4    Period Weeks    Status New    Target Date 09/22/20             PT Long Term Goals - 08/25/20 1157      PT LONG TERM GOAL #1   Title Patient will increase BLE gross strength to 4+/5 as to improve functional strength for independent gait, increased standing tolerance and increased ADL ability.    Time 8    Period Weeks    Status New    Target Date 10/20/20      PT LONG TERM GOAL #2   Title Patient will improve FOTO score to >55/100 to indicate improved functional mobility with ADLs.    Time 8    Period Weeks    Status New    Target Date 10/20/20      PT LONG TERM GOAL #3   Title Patient will report a worst pain of 3/10 on VAS in      right knee       to improve tolerance with ADLs and reduced symptoms with activities.     Time 8    Period Weeks    Status New    Target Date 10/20/20      PT LONG TERM GOAL #4   Title Patient will complete >1000 feet on 6 min walk test without increase in knee pain to improve community ambulator distance for functional tasks and return to PLOF.    Time 8    Period Weeks    Status New    Target Date 10/20/20      PT LONG TERM GOAL #5   Title Patient will increase 10 meter walk test to >1.39m/s as to improve  gait speed for better community ambulation and to reduce fall risk.    Time 8    Period Weeks    Status New    Target Date 10/20/20                 Plan - 10/15/20 1631    Clinical Impression Statement Patient responded well to manual techniques including tibiofemoral joint glides today in supine and seated positions and demonstrated no pain with walking after treatment. Withheld resistive training or exercise other than walking due to the fact that she received an injection into her right knee 1 week ago. She should be able to resume normal treatment next visit.    Personal Factors and Comorbidities Age;Comorbidity 3+;Time since onset of injury/illness/exacerbation    Comorbidities HTN, OA, retinal tear in left eye with impaired vision, ringing in left ear, imbalance, A-fib, DMx2 controlled, insomnia, obesity, left intraventricular meningioma- treated with gamma knife in Nov 2021    Examination-Activity Limitations Locomotion Level;Sleep;Squat;Stairs;Stand;Transfers    Examination-Participation Restrictions Cleaning;Community Activity;Shop;Volunteer;Yard Work    [employer] officer Evolving/Moderate complexity    Rehab Potential Good    PT Frequency 2x / week    PT Duration 8 weeks    PT Treatment/Interventions ADLs/Self Care Home Management;Cryotherapy;Moist Heat;Gait training;Stair training;Functional  mobility training;Therapeutic activities;Therapeutic exercise;Balance training;Neuromuscular re-education;Patient/family education;Orthotic Fit/Training;Manual techniques;Passive range of motion;Dry needling;Energy conservation;Taping    PT Next Visit Plan PT PROGRESS Due and continue with manual therapy/resume LE strengthening/ROM    PT Home Exercise Plan will address next session    Consulted and Agree with Plan of Care Patient           Patient will benefit from skilled therapeutic intervention in order to improve the following deficits and impairments:  Abnormal  gait,Decreased balance,Decreased endurance,Decreased mobility,Difficulty walking,Obesity,Decreased range of motion,Decreased activity tolerance,Decreased strength,Pain  Visit Diagnosis: Acute pain of right knee  Muscle weakness (generalized)  Unsteadiness on feet  Pain in left foot  Pain in right foot  Difficulty in walking, not elsewhere classified     Problem List Patient Active Problem List   Diagnosis Date Noted  . Right knee pain 07/10/2020  . Hoarseness of voice 04/21/2020  . Overactive bladder 10/09/2019  . Tinnitus, bilateral 11/28/2017  . Meningioma ([place]) 09/21/2017  . PAF (paroxysmal atrial fibrillation) ([place]) 02/03/2017  . Preventative health care 06/02/2016  . Advance directive discussed with patient 06/02/2016  . Hypertension   . Type 2 diabetes, controlled, with peripheral neuropathy ([place])   . Generalized osteoarthritis of multiple sites   . GERD (gastroesophageal reflux disease)   . Hyperthyroidism   . Retinal hole of right eye 06/26/2014  . Amblyopia, right 05/08/2014  . Status post intraocular lens implant 05/08/2014    Lewis Moccasin, PT 10/15/2020, 4:48 PM  [place] MAIN [facility] SERVICES [address] [place], [place], [address] Phone: [phone]   Fax:  [phone]  Name: Angela Bentley MRN: [id number] Date of Birth: [birth year]

## 2020-10-20 ENCOUNTER — Other Ambulatory Visit: Payer: Self-pay

## 2020-10-20 ENCOUNTER — Ambulatory Visit: Payer: Medicare Other

## 2020-10-20 DIAGNOSIS — R2681 Unsteadiness on feet: Secondary | ICD-10-CM | POA: Diagnosis not present

## 2020-10-20 DIAGNOSIS — M25561 Pain in right knee: Secondary | ICD-10-CM | POA: Diagnosis not present

## 2020-10-20 DIAGNOSIS — M79672 Pain in left foot: Secondary | ICD-10-CM | POA: Diagnosis not present

## 2020-10-20 DIAGNOSIS — R262 Difficulty in walking, not elsewhere classified: Secondary | ICD-10-CM

## 2020-10-20 DIAGNOSIS — M6281 Muscle weakness (generalized): Secondary | ICD-10-CM | POA: Diagnosis not present

## 2020-10-20 DIAGNOSIS — G8929 Other chronic pain: Secondary | ICD-10-CM | POA: Diagnosis not present

## 2020-10-20 NOTE — Therapy (Unsigned)
[place] MAIN [facility] SERVICES [address] [place], [place], [address] Phone: [phone]   Fax:  [phone]  Physical Therapy Treatment/Recertification    Patient Details  Name: Angela Bentley MRN: [id number] Date of Birth: 04-09-1941 Referring Provider (PT): Loura Pardon, MD   Encounter Date: 10/20/2020   PT End of Session - 10/20/20 1737    Visit Number 10    Number of Visits 17    Date for PT Re-Evaluation 10/19/20    PT Start Time 6387    PT Stop Time 1233    PT Time Calculation (min) 48 min    Equipment Utilized During Treatment Gait belt    Activity Tolerance Patient tolerated treatment well;No increased pain    Behavior During Therapy WFL for tasks assessed/performed           Past Medical History:  Diagnosis Date  . Balance problems   . Brain tumor ([place])    a. Left intraventricular tumor ->stable by 06/2017 MRI. Followed @ [facility].  . Gait disturbance    a. Unsteady on feet/balance difficulty.  . Generalized osteoarthritis of multiple sites   . GERD (gastroesophageal reflux disease)   . History of DVT (deep vein thrombosis) 2016   estrogen and airflights. Rx with xarelto for 3 months  . History of stress test    a. 11/2016 ETT: No ST/T changes.  Performed to assess PAC/PVC burden with activity ->No ectopy noted.  . Hypertension   . Hyperthyroidism    treated briefly in college  . Insomnia   . Obesity   . PAF (paroxysmal atrial fibrillation) ([place])    a. 12/2016 Event Monitor: brief run of PAF-->CHA2DS2VASc = 5--> Xarelto.  . Plantar fasciitis   . Retinal tear of left eye   . Rosacea   . Squamous cell carcinoma of skin 09/07/2017   R dorsum of hand   . Type 2 diabetes, controlled, with peripheral neuropathy ([place])    a. 11/2016 A1c 7.4.  . Uterine fibroid     Past Surgical History:  Procedure Laterality Date  . CARPAL TUNNEL RELEASE Right    Dr Burney Gauze  . CATARACT EXTRACTION    . Gamma knife  07/2020    intraventricular mass---meningioma or choroid plexus papilloma  . RETINAL TEAR REPAIR CRYOTHERAPY     10/09/13, then again 3/15    There were no vitals filed for this visit.   Subjective Assessment - 10/20/20 1735    Subjective Pt reports recent cortisone shot to R knee but says R knee still hurting. Pt reports has not been doing HEP past two weeks due to shot. Pt denies pain currently.    Pertinent History 80 yo Female reports recent new onset of LLE hip pain which started mid October 2021. As a result she is having increased RLE knee pain. She did get X-rays of left hip which shows no arthritis or acute injury. She reports since the hip started hurting it has actually gotten better, but now the right knee is bothering her more and she is having more of a balance issue. She had a gamma knife procedure for Meningioma on 08/14/20; She reports since having the procedure she has noticed shuffling steps and unsteady gait. She has been using tripod base cane due to hip/knee pain and has continued using it for balance/stability; She states side effects from gamma knife procedure is possible brain swelling from 6 months to 3 years after procedure. She reports occasional headaches; She is still having  ringing in the ears on left side which is chronic. She also denies any change in vision;    How long can you sit comfortably? NA    How long can you stand comfortably? will having some pain with initial standing, but then as she moves around its okay    How long can you walk comfortably? 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 There are heterogeneous areas of enhancement of the tumor and different lobules. Differential  includes primarily an intraventricular meningioma. However with the enlargement consideration is also given for a tumor of the choroid plexus such as choroid plexus papilloma although less commonly seen in the lateral ventricles in adult patients, and other tumors. The growth appears slow for a cord plexus carcinoma.    Patient Stated Goals "What is going on with the knee, see if I need surgery"    Currently in Pain? No/denies    Pain Onset 1 to 4 weeks ago            Testing: Pt seated knee flexion/extension AROM 2x10 R LE ROM assessment: R knee extension- 5 deg from neutral MMT: 5/5 hip abduction/adduction, hip flexion 4+/5 B, Knee flexion R 5/5, L 4+/5, knee extension 5/5 B;  ankle PF/DF both  5/5 B  FOTO - deferred to next session 6 min walk test - 1,085 ft in 6 minutes, one brief standing rest break, no pain reported with test 10MWT - 0.8 m/s    Assessment: Pt making progress toward goals as indicated by pt achieving 3 goals and making progress toward majority of remaining goals. Pt still with some residual strength deficits (see MMT), decreased R LE mobility and decreased gait speed as evidenced by pt ambulating 0.8 m/s on 10MWT. Although pt demonstrating progress with R knee pain by reporting no knee pain during session, pt did report she has still been having R knee pain that disrupts her sleep. Patient's condition has the potential to improve in response to therapy. Maximum improvement is yet to be obtained. The anticipated improvement is attainable and reasonable in a generally predictable time. Pt will benefit from further skilled therapy to improve R knee pain, mobility, LE strength and gait in order to increase ease with functional activities and improve QOL.     PT Education - 10/20/20 1737    Education Details Pt educated on goal findings and POC    Person(s) Educated Patient    Methods Explanation    Comprehension Verbalized understanding            PT Short Term  Goals - 10/20/20 1738      PT SHORT TERM GOAL #1   Title Patient will be adherent to HEP at least 3x a week to improve functional strength and balance for better safety at home.    Baseline 10/20/2020 Pt reports she has not done HEP in past 2 weeks    Time 4    Period Weeks    Status On-going    Target Date 11/17/20      PT SHORT TERM GOAL #2   Title Patient will increase RLE knee AROM extension to within 5 degrees of neutral for better standing and walking tolerance;  Baseline 10/20/2020 partially met pt 5 degrees from neutral    Time 4    Period Weeks    Status Partially Met    Target Date 11/17/20      PT SHORT TERM GOAL #3   Title Patient will reports maximum of 1 sleep disturbances per night over last 3 days to exhibit improved sleeping tolerance with less knee pain and reduced fatigue.     Baseline 10/20/2020 Pt reports sleep has been disturbed due to pain for past couple nights    Time 4    Period Weeks    Status On-going    Target Date 11/17/20             PT Long Term Goals - 10/20/20 1202      PT LONG TERM GOAL #1   Title Patient will increase BLE gross strength to 4+/5 as to improve functional strength for independent gait, increased standing tolerance and increased ADL ability.    Baseline 10/20/2020 MMT: 5/5 hip abduction/adduction, hip flexion 4+/5 B, Knee flexion R 5/5, L 4+/5, knee extension 5/5 B;  ankle PF/DF both  5/5 B, grossly 4+/5    Time 8    Period Weeks    Status Achieved      PT LONG TERM GOAL #2   Title Patient will improve FOTO score to >55/100 to indicate improved functional mobility with ADLs.    Baseline 10/20/2020 deferred to next session    Time 8    Period Weeks    Status On-going    Target Date 12/15/20      PT LONG TERM GOAL #3   Title Patient will report a worst pain of 3/10 on VAS in      right knee       to improve tolerance with ADLs and reduced symptoms with activities.     Baseline Pt reports 1/10 R knee pain in last week     Time 8    Period Weeks    Status Achieved      PT LONG TERM GOAL #4   Title Patient will complete >1000 feet on 6 min walk test without increase in knee pain to improve community ambulator distance for functional tasks and return to PLOF.    Baseline 10/20/2020 pt completed 1085 ft with no reports of knee pain    Time 8    Period Weeks    Status Achieved      PT LONG TERM GOAL #5   Title Patient will increase 10 meter walk test to >1.36ms as to improve gait speed for better community ambulation and to reduce fall risk.    Baseline 10/20/2020 0.8 m/s    Time 8    Period Weeks    Status On-going    Target Date 12/15/20                 Plan - 10/20/20 1754    Clinical Impression Statement Pt making progress toward goals as indicated by pt achieving 3 goals and making progress toward majority of remaining goals. Pt still with some residual strength deficits (see MMT), decreased R LE mobility and decreased gait speed as evidenced by pt ambulating 0.8 m/s on 10MWT. Although pt demonstrating progress with R knee pain by reporting no knee pain during session, pt did report she has still been having R knee pain that disrupts her sleep.  Patient's condition has the potential to improve in response to therapy. Maximum improvement is yet to be  obtained. The anticipated improvement is attainable and reasonable in a generally predictable time. Pt will benefit from further skilled therapy to improve R knee pain, mobility, LE strength and gait in order to increase ease with functional activities and improve QOL.    Personal Factors and Comorbidities Age;Comorbidity 3+;Time since onset of injury/illness/exacerbation    Comorbidities HTN, OA, retinal tear in left eye with impaired vision, ringing in left ear, imbalance, A-fib, DMx2 controlled, insomnia, obesity, left intraventricular meningioma- treated with gamma knife in Nov 2021    Examination-Activity Limitations Locomotion  Level;Sleep;Squat;Stairs;Stand;Transfers    Examination-Participation Restrictions Cleaning;Community Activity;Shop;Volunteer;Yard Work    [employer] Evolving/Moderate complexity    Rehab Potential Good    PT Frequency 2x / week    PT Duration 8 weeks    PT Treatment/Interventions ADLs/Self Care Home Management;Cryotherapy;Moist Heat;Gait training;Stair training;Functional mobility training;Therapeutic activities;Therapeutic exercise;Balance training;Neuromuscular re-education;Patient/family education;Orthotic Fit/Training;Manual techniques;Passive range of motion;Dry needling;Energy conservation;Taping    PT Next Visit Plan continue with manual therapy/resume LE strengthening/ROM, gait training and endurance    PT Home Exercise Plan will address next session    Consulted and Agree with Plan of Care Patient           Patient will benefit from skilled therapeutic intervention in order to improve the following deficits and impairments:  Abnormal gait,Decreased balance,Decreased endurance,Decreased mobility,Difficulty walking,Obesity,Decreased range of motion,Decreased activity tolerance,Decreased strength,Pain  Visit Diagnosis: Difficulty in walking, not elsewhere classified  Unsteadiness on feet  Muscle weakness (generalized)     Problem List Patient Active Problem List   Diagnosis Date Noted  . Right knee pain 07/10/2020  . Hoarseness of voice 04/21/2020  . Overactive bladder 10/09/2019  . Tinnitus, bilateral 11/28/2017  . Meningioma ([place]) 09/21/2017  . PAF (paroxysmal atrial fibrillation) ([place]) 02/03/2017  . Preventative health care 06/02/2016  . Advance directive discussed with patient 06/02/2016  . Hypertension   . Type 2 diabetes, controlled, with peripheral neuropathy ([place])   . Generalized osteoarthritis of multiple sites   . GERD (gastroesophageal reflux disease)   . Hyperthyroidism   . Retinal hole of right eye 06/26/2014  . Amblyopia, right  05/08/2014  . Status post intraocular lens implant 05/08/2014   Ricard Dillon PT, DPT  Zollie Pee 10/21/2020, 8:02 AM  [place] MAIN [facility] SERVICES [address] [place], [place], [address] Phone: [phone]   Fax:  [phone]  Name: Angela Bentley MRN: [id number] Date of Birth: [birth year]

## 2020-10-21 NOTE — Addendum Note (Signed)
Addended by: Zollie Pee on: 10/21/2020 08:15 AM   Modules accepted: Orders

## 2020-10-22 ENCOUNTER — Ambulatory Visit: Payer: Medicare Other

## 2020-10-22 ENCOUNTER — Other Ambulatory Visit: Payer: Self-pay

## 2020-10-22 DIAGNOSIS — R262 Difficulty in walking, not elsewhere classified: Secondary | ICD-10-CM | POA: Diagnosis not present

## 2020-10-22 DIAGNOSIS — G8929 Other chronic pain: Secondary | ICD-10-CM

## 2020-10-22 DIAGNOSIS — M25561 Pain in right knee: Secondary | ICD-10-CM | POA: Diagnosis not present

## 2020-10-22 DIAGNOSIS — M6281 Muscle weakness (generalized): Secondary | ICD-10-CM | POA: Diagnosis not present

## 2020-10-22 DIAGNOSIS — M79672 Pain in left foot: Secondary | ICD-10-CM | POA: Diagnosis not present

## 2020-10-22 DIAGNOSIS — R2681 Unsteadiness on feet: Secondary | ICD-10-CM | POA: Diagnosis not present

## 2020-10-22 NOTE — Therapy (Signed)
[place] MAIN [facility] SERVICES [address] [place], [place], [address] Phone: [phone]   Fax:  [phone]  Physical Therapy Treatment  Patient Details  Name: Angela Bentley MRN: [id number] Date of Birth: [birth year] Referring Provider (PT): Loura Pardon, MD   Encounter Date: 10/22/2020   PT End of Session - 10/22/20 1649    Visit Number 11    Number of Visits 33    Date for PT Re-Evaluation 12/15/20    PT Start Time 1146    PT Stop Time 1232    PT Time Calculation (min) 46 min    Equipment Utilized During Treatment Gait belt    Activity Tolerance Patient tolerated treatment well    Behavior During Therapy [facility] for tasks assessed/performed           Past Medical History:  Diagnosis Date  . Balance problems   . Brain tumor ([place])    a. Left intraventricular tumor ->stable by 06/2017 MRI. Followed @ [facility].  . Gait disturbance    a. Unsteady on feet/balance difficulty.  . Generalized osteoarthritis of multiple sites   . GERD (gastroesophageal reflux disease)   . History of DVT (deep vein thrombosis) 2016   estrogen and airflights. Rx with xarelto for 3 months  . History of stress test    a. 11/2016 ETT: No ST/T changes.  Performed to assess PAC/PVC burden with activity ->No ectopy noted.  . Hypertension   . Hyperthyroidism    treated briefly in college  . Insomnia   . Obesity   . PAF (paroxysmal atrial fibrillation) ([place])    a. 12/2016 Event Monitor: brief run of PAF-->CHA2DS2VASc = 5--> Xarelto.  . Plantar fasciitis   . Retinal tear of left eye   . Rosacea   . Squamous cell carcinoma of skin 09/07/2017   R dorsum of hand   . Type 2 diabetes, controlled, with peripheral neuropathy ([place])    a. 11/2016 A1c 7.4.  . Uterine fibroid     Past Surgical History:  Procedure Laterality Date  . CARPAL TUNNEL RELEASE Right    Dr Burney Gauze  . CATARACT EXTRACTION    . 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 RETINAL TEAR REPAIR CRYOTHERAPY     10/09/13, then again 3/15    There were no vitals filed for this visit.   Subjective Assessment - 10/22/20 1646    Subjective Pt reports R knee pain is 2/10 today. Pt reports she is going to have follow-up injection in her R knee in another month as she feels R knee pain is still limiting her. Pt reports she has been walking to her mailbox and back a couple times per day but reports it's less than a mile.    Pertinent History 80 yo Female reports recent new onset of LLE hip pain which started mid October 2021. As a result she is having increased RLE knee pain. She did get X-rays of left hip which shows no arthritis or acute injury. She reports since the hip started hurting it has actually gotten better, but now the right knee is bothering her more and she is having more of a balance issue. She had a gamma knife procedure for Meningioma on 08/14/20; She reports since having the procedure she has noticed shuffling steps and unsteady gait. She has been using tripod base cane due to hip/knee pain and has continued using it for balance/stability; She states side effects from gamma  knife procedure is possible brain swelling from 6 months to 3 years after procedure. She reports occasional headaches; She is still having ringing in the ears on left side which is chronic. She also denies any change in vision;    How long can you sit comfortably? NA    How long can you stand comfortably? will having some pain with initial standing, but then as she moves around its okay    How long can you walk comfortably? 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 There are heterogeneous  areas of enhancement of the tumor and different lobules. Differential includes primarily an intraventricular meningioma. However with the enlargement consideration is also given for a tumor of the choroid plexus such as choroid plexus papilloma although less commonly seen in the lateral ventricles in adult patients, and other tumors. The growth appears slow for a cord plexus carcinoma.    Patient Stated Goals "What is going on with the knee, see if I need surgery"    Currently in Pain? Yes    Pain Score 2     Pain Location Knee    Pain Orientation Right    Pain Onset 1 to 4 weeks ago          Integumentary - Pt skin on B LEs appears intact with no redness, normal temperature B. Slight edema noted on at R lateral knee at start of session. No changes noted to skin appearance or temperature at end of session.    Therapeutic exercise: Seated LAQ - 3x15 B LEs  Seated hamstring curl YTB - 3x15 B LES  In // bars: Step-ups onto 6" block forward/backward - x multiple reps, alternating leading leg. VC for foot clearance to prevent foot drag from block. Forward/backward walking, CGA - x multiple repetitions; emphasis in B LE step length/hip extension Seated rest break Seated, manual knee extension isometrics 1x5 with 5 sec hold for pain modulation as pt reports 4/10 R knee pain from 2/10 at beginning of session.   Neuro Re-ed: In // bars: Stepping over one hurdle 2x10 repetitions from BUE support to no UE support to, CGA. VC to increase hip/knee flexion/foot clearance particularly on R LE to avoid obstacle. Pt with one instance of requiring UE support on bar to regain balance. Seated rest break Stepping over 2" foam and hurdle - x multiple repetitions, UUE support to no UE support, CGA, VC provided    Assessment: Pt greatest difficulty this session clearing R LE over and onto/off of step, requiring frequent VC throughout. Pt did have increase of R knee pain with // bar exercises from 2/10 to 4/10.  Pt assisted in pt R LE manual knee isometrics for pain modulation, however, pt did not report if change in sx. PT instructed pt to watch for increases in R knee pain and/or swelling and to contact her physician if knee pain and/or swelling continues to increase. Pt will benefit from further skilled therapy to improve R knee pain, mobility, and B LE strength.      PT Education - 10/22/20 1648    Education Details Pt educated on technique with step-ups    Person(s) Educated Patient    Methods Explanation;Demonstration;Verbal cues    Comprehension  Verbalized understanding;Returned demonstration            PT Short Term Goals - 10/20/20 1738      PT SHORT TERM GOAL #1   Title Patient will be adherent to HEP at least 3x a week to improve functional strength and balance for better safety at home.    Baseline 10/20/2020 Pt reports she has not done HEP in past 2 weeks    Time 4    Period Weeks    Status On-going    Target Date 11/17/20      PT SHORT TERM GOAL #2   Title Patient will increase RLE knee AROM extension to within 5 degrees of neutral for better standing and walking tolerance;     Baseline 10/20/2020 partially met pt 5 degrees from neutral    Time 4    Period Weeks    Status Partially Met    Target Date 11/17/20      PT SHORT TERM GOAL #3   Title Patient will reports maximum of 1 sleep disturbances per night over last 3 days to exhibit improved sleeping tolerance with less knee pain and reduced fatigue.     Baseline 10/20/2020 Pt reports sleep has been disturbed due to pain for past couple nights    Time 4    Period Weeks    Status On-going    Target Date 11/17/20             PT Long Term Goals - 10/20/20 1202      PT LONG TERM GOAL #1   Title Patient will increase BLE gross strength to 4+/5 as to improve functional strength for independent gait, increased standing tolerance and increased ADL ability.    Baseline 10/20/2020 MMT: 5/5 hip abduction/adduction, hip  flexion 4+/5 B, Knee flexion R 5/5, L 4+/5, knee extension 5/5 B;  ankle PF/DF both  5/5 B, grossly 4+/5    Time 8    Period Weeks    Status Achieved      PT LONG TERM GOAL #2   Title Patient will improve FOTO score to >55/100 to indicate improved functional mobility with ADLs.    Baseline 10/20/2020 deferred to next session    Time 8    Period Weeks    Status On-going    Target Date 12/15/20      PT LONG TERM GOAL #3   Title Patient will report a worst pain of 3/10 on VAS in      right knee       to improve tolerance with ADLs and reduced symptoms with activities.     Baseline Pt reports 1/10 R knee pain in last week    Time 8    Period Weeks    Status Achieved      PT LONG TERM GOAL #4   Title Patient will complete >1000 feet on 6 min walk test without increase in knee pain to improve community ambulator distance for functional tasks and return to PLOF.    Baseline 10/20/2020 pt completed 1085 ft with no reports of knee pain    Time 8    Period Weeks    Status Achieved      PT LONG TERM GOAL #5   Title Patient will increase 10 meter walk test to >1.97ms as to improve gait speed for better community ambulation and to reduce fall risk.    Baseline 10/20/2020 0.8 m/s    Time 8    Period Weeks  Status On-going    Target Date 12/15/20             Plan - 10/22/20 1710    Clinical Impression Statement Pt greatest difficulty this session clearing R LE over and onto/off of step, requiring frequent VC throughout. Pt did have increase of R knee pain with // bar exercises from 2/10 to 4/10. Pt assisted in pt R LE manual knee isometrics for pain modulation, however, pt did not report if change in sx. PT instructed pt to watch for increases in R knee pain and/or swelling and to contact her physician if knee pain and/or swelling continues to increase. Pt will benefit from further skilled therapy to improve R knee pain, mobility, and B LE strength.    Personal Factors and Comorbidities  Age;Comorbidity 3+;Time since onset of injury/illness/exacerbation    Comorbidities HTN, OA, retinal tear in left eye with impaired vision, ringing in left ear, imbalance, A-fib, DMx2 controlled, insomnia, obesity, left intraventricular meningioma- treated with gamma knife in Nov 2021    Examination-Activity Limitations Locomotion Level;Sleep;Squat;Stairs;Stand;Transfers    Examination-Participation Restrictions Cleaning;Community Activity;Shop;Volunteer;Yard Work    [employer] Evolving/Moderate complexity    Rehab Potential Good    PT Frequency 2x / week    PT Duration 8 weeks    PT Treatment/Interventions ADLs/Self Care Home Management;Cryotherapy;Moist Heat;Gait training;Stair training;Functional mobility training;Therapeutic activities;Therapeutic exercise;Balance training;Neuromuscular re-education;Patient/family education;Orthotic Fit/Training;Manual techniques;Passive range of motion;Dry needling;Energy conservation;Taping    PT Next Visit Plan /resume LE strengthening/ROM, gait training and endurance, continue with manual therapy    PT Home Exercise Plan will address next session    Consulted and Agree with Plan of Care Patient           Patient will benefit from skilled therapeutic intervention in order to improve the following deficits and impairments:  Abnormal gait,Decreased balance,Decreased endurance,Decreased mobility,Difficulty walking,Obesity,Decreased range of motion,Decreased activity tolerance,Decreased strength,Pain  Visit Diagnosis: Muscle weakness (generalized)  Difficulty in walking, not elsewhere classified  Chronic pain of right knee  Unsteadiness on feet     Problem List Patient Active Problem List   Diagnosis Date Noted  . Right knee pain 07/10/2020  . Hoarseness of voice 04/21/2020  . Overactive bladder 10/09/2019  . Tinnitus, bilateral 11/28/2017  . Meningioma ([place]) 09/21/2017  . PAF (paroxysmal atrial fibrillation) ([place])  02/03/2017  . Preventative health care 06/02/2016  . Advance directive discussed with patient 06/02/2016  . Hypertension   . Type 2 diabetes, controlled, with peripheral neuropathy ([place])   . Generalized osteoarthritis of multiple sites   . GERD (gastroesophageal reflux disease)   . Hyperthyroidism   . Retinal hole of right eye 06/26/2014  . Amblyopia, right 05/08/2014  . Status post intraocular lens implant 05/08/2014   Ricard Dillon PT, DPT  10/22/2020, 5:19 PM  [place] MAIN [facility] SERVICES [address] [place], [place], [address] Phone: [phone]   Fax:  [phone]  Name: Angela Bentley MRN: [id number] Date of Birth: Dec 03, 1940

## 2020-10-27 ENCOUNTER — Ambulatory Visit: Payer: Medicare Other

## 2020-10-28 ENCOUNTER — Telehealth: Payer: Self-pay

## 2020-10-28 NOTE — Telephone Encounter (Signed)
Submitted VOB, SynviscOne, right knee. 

## 2020-10-29 ENCOUNTER — Other Ambulatory Visit: Payer: Self-pay

## 2020-10-29 ENCOUNTER — Ambulatory Visit: Payer: Medicare Other | Attending: Family Medicine

## 2020-10-29 DIAGNOSIS — M25561 Pain in right knee: Secondary | ICD-10-CM | POA: Insufficient documentation

## 2020-10-29 DIAGNOSIS — R262 Difficulty in walking, not elsewhere classified: Secondary | ICD-10-CM | POA: Insufficient documentation

## 2020-10-29 DIAGNOSIS — R2681 Unsteadiness on feet: Secondary | ICD-10-CM

## 2020-10-29 DIAGNOSIS — G8929 Other chronic pain: Secondary | ICD-10-CM | POA: Insufficient documentation

## 2020-10-29 DIAGNOSIS — M6281 Muscle weakness (generalized): Secondary | ICD-10-CM | POA: Insufficient documentation

## 2020-10-29 NOTE — Therapy (Signed)
[place] MAIN [facility] SERVICES [address] [place], [place], [address] Phone: [phone]   Fax:  [phone]  Physical Therapy Treatment  Patient Details  Name: Angela Bentley MRN: [id number] Date of Birth: 1941-08-08 Referring Provider (PT): Loura Pardon, MD   Encounter Date: 10/29/2020   PT End of Session - 10/29/20 1640    Visit Number 12    Number of Visits 33    Date for PT Re-Evaluation 12/15/20    PT Start Time 5374    PT Stop Time 1515    PT Time Calculation (min) 39 min    Equipment Utilized During Treatment Gait belt    Activity Tolerance Patient tolerated treatment well    Behavior During Therapy [facility] for tasks assessed/performed           Past Medical History:  Diagnosis Date  . Balance problems   . Brain tumor ([place])    a. Left intraventricular tumor ->stable by 06/2017 MRI. Followed @ [facility].  . Gait disturbance    a. Unsteady on feet/balance difficulty.  . Generalized osteoarthritis of multiple sites   . GERD (gastroesophageal reflux disease)   . History of DVT (deep vein thrombosis) 2016   estrogen and airflights. Rx with xarelto for 3 months  . History of stress test    a. 11/2016 ETT: No ST/T changes.  Performed to assess PAC/PVC burden with activity ->No ectopy noted.  . Hypertension   . Hyperthyroidism    treated briefly in college  . Insomnia   . Obesity   . PAF (paroxysmal atrial fibrillation) ([place])    a. 12/2016 Event Monitor: brief run of PAF-->CHA2DS2VASc = 5--> Xarelto.  . Plantar fasciitis   . Retinal tear of left eye   . Rosacea   . Squamous cell carcinoma of skin 09/07/2017   R dorsum of hand   . Type 2 diabetes, controlled, with peripheral neuropathy ([place])    a. 11/2016 A1c 7.4.  . Uterine fibroid     Past Surgical History:  Procedure Laterality Date  . CARPAL TUNNEL RELEASE Right    Dr Burney Gauze  . CATARACT EXTRACTION    . 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 RETINAL TEAR REPAIR CRYOTHERAPY     10/09/13, then again 3/15    There were no vitals filed for this visit.   Subjective Assessment - 10/29/20 1436    Subjective Pt reports R knee pain is 3/10 but only when weightbearing on R LE.    Pertinent History 80 yo Female reports recent new onset of LLE hip pain which started mid October 2021. As a result she is having increased RLE knee pain. She did get X-rays of left hip which shows no arthritis or acute injury. She reports since the hip started hurting it has actually gotten better, but now the right knee is bothering her more and she is having more of a balance issue. She had a gamma knife procedure for Meningioma on 08/14/20; She reports since having the procedure she has noticed shuffling steps and unsteady gait. She has been using tripod base cane due to hip/knee pain and has continued using it for balance/stability; She states side effects from gamma knife procedure is possible brain swelling from 6 months to 3 years after procedure. She reports occasional headaches; She is still having ringing in the ears on left side which is chronic. She also denies any change in vision;    How  long can you sit comfortably? NA    How long can you stand comfortably? will having some pain with initial standing, but then as she moves around its okay    How long can you walk comfortably? 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 There are heterogeneous areas of enhancement of the tumor and different lobules. Differential includes primarily an intraventricular meningioma. However with the enlargement consideration is also given for a tumor of the choroid plexus such  as choroid plexus papilloma although less commonly seen in the lateral ventricles in adult patients, and other tumors. The growth appears slow for a cord plexus carcinoma.    Patient Stated Goals "What is going on with the knee, see if I need surgery"    Currently in Pain? Yes    Pain Score 3     Pain Location Knee    Pain Orientation Right    Pain Onset 1 to 4 weeks ago           FOTO: 59   Treatment:  Manual:  Pt supine on plinth, superior/inferior/medial/lateral grade II-III mobilization, multiple bouts of 30 sec in each   Therapeutic Exercise:  Seated hamstring stretch 4x30 sec B LEs Standing gastrocsol stretch 1x30 sec BLE, discontinued d/t reports of R knee pain Seated gastrocsol stretch - 2x30 sec BLEs, pt reports no pain   Neuro Re-Ed:  Step-ups onto 6" block - x multiple reps, CGA emphasis on SLB going from BUE support to unilateral UE support to intermittent Lateral/forward stepping over hurdle - x multiple reps each emphasis on increased side-step, BUE support to intermittent, CGA  Assessment: Pt continues to have difficulty with foot clearance and sufficient step-length for clearing objects, knocking over hurdle with lateral and forward stepping 3x, and scuffing 6" block with step-downs. Pt also exhibited decreased postural stability with removing UE support, requiring intermittent support to maintain balance. Pt will benefit from further skilled therapy to improve B LE strength, pain, and balance.   PT Education - 10/29/20 1640    Education Details Pt educated on lateral step-overs/bodymechanics    Person(s) Educated Patient    Methods Explanation;Demonstration;Verbal cues    Comprehension Verbalized understanding;Returned demonstration            PT Short Term Goals - 10/20/20 1738      PT SHORT TERM GOAL #1   Title Patient will be adherent to HEP at least 3x a week to improve functional strength and balance for better safety at home.    Baseline 10/20/2020 Pt  reports she has not done HEP in past 2 weeks    Time 4    Period Weeks    Status On-going    Target Date 11/17/20      PT SHORT TERM GOAL #2   Title Patient will increase RLE knee AROM extension to within 5 degrees of neutral for better standing and walking tolerance;     Baseline 10/20/2020 partially met pt 5 degrees from neutral    Time 4    Period Weeks    Status Partially Met    Target Date 11/17/20      PT SHORT TERM GOAL #3   Title Patient will reports maximum of 1  sleep disturbances per night over last 3 days to exhibit improved sleeping tolerance with less knee pain and reduced fatigue.     Baseline 10/20/2020 Pt reports sleep has been disturbed due to pain for past couple nights    Time 4    Period Weeks    Status On-going    Target Date 11/17/20             PT Long Term Goals - 10/29/20 1644      PT LONG TERM GOAL #1   Title Patient will increase BLE gross strength to 4+/5 as to improve functional strength for independent gait, increased standing tolerance and increased ADL ability.    Baseline 10/20/2020 MMT: 5/5 hip abduction/adduction, hip flexion 4+/5 B, Knee flexion R 5/5, L 4+/5, knee extension 5/5 B;  ankle PF/DF both  5/5 B, grossly 4+/5    Time 8    Period Weeks    Status Achieved      PT LONG TERM GOAL #2   Title Patient will improve FOTO score to >55/100 to indicate improved functional mobility with ADLs.    Baseline 10/20/2020 deferred to next session; 10/29/2020 FOTO 59%    Time 8    Period Weeks    Status Achieved      PT LONG TERM GOAL #3   Title Patient will report a worst pain of 3/10 on VAS in      right knee       to improve tolerance with ADLs and reduced symptoms with activities.     Baseline Pt reports 1/10 R knee pain in last week    Time 8    Period Weeks    Status Achieved      PT LONG TERM GOAL #4   Title Patient will complete >1000 feet on 6 min walk test without increase in knee pain to improve community ambulator distance for  functional tasks and return to PLOF.    Baseline 10/20/2020 pt completed 1085 ft with no reports of knee pain    Time 8    Period Weeks    Status Achieved      PT LONG TERM GOAL #5   Title Patient will increase 10 meter walk test to >1.68ms as to improve gait speed for better community ambulation and to reduce fall risk.    Baseline 10/20/2020 0.8 m/s    Time 8    Period Weeks    Status On-going            Patient will benefit from skilled therapeutic intervention in order to improve the following deficits and impairments:  Abnormal gait,Decreased balance,Decreased endurance,Decreased mobility,Difficulty walking,Obesity,Decreased range of motion,Decreased activity tolerance,Decreased strength,Pain  Visit Diagnosis: Muscle weakness (generalized)  Unsteadiness on feet  Chronic pain of right knee  Difficulty in walking, not elsewhere classified     Problem List Patient Active Problem List   Diagnosis Date Noted  . Right knee pain 07/10/2020  . Hoarseness of voice 04/21/2020  . Overactive bladder 10/09/2019  . Tinnitus, bilateral 11/28/2017  . Meningioma ([place] 09/21/2017  . PAF (paroxysmal atrial fibrillation) ([place] 02/03/2017  . Preventative health care 06/02/2016  . Advance directive discussed with patient 06/02/2016  . Hypertension   . Type 2 diabetes, controlled, with peripheral neuropathy ([place]   . Generalized osteoarthritis of multiple sites   . GERD (gastroesophageal reflux disease)   . Hyperthyroidism   . Retinal hole of right eye 06/26/2014  . Amblyopia, right 05/08/2014  . Status post intraocular  lens implant 05/08/2014   Ricard Dillon PT, DPT 10/29/2020, 5:03 PM  [place] MAIN [facility] SERVICES [address] [place], [place], [address] Phone: [phone]   Fax:  [phone]  Name: Angela Bentley MRN: [id number] Date of Birth: 05/23/1941

## 2020-11-02 ENCOUNTER — Ambulatory Visit: Payer: Medicare Other

## 2020-11-03 ENCOUNTER — Ambulatory Visit: Payer: Medicare Other

## 2020-11-03 ENCOUNTER — Other Ambulatory Visit: Payer: Self-pay

## 2020-11-03 DIAGNOSIS — M25561 Pain in right knee: Secondary | ICD-10-CM | POA: Diagnosis not present

## 2020-11-03 DIAGNOSIS — G8929 Other chronic pain: Secondary | ICD-10-CM | POA: Diagnosis not present

## 2020-11-03 DIAGNOSIS — R2681 Unsteadiness on feet: Secondary | ICD-10-CM | POA: Diagnosis not present

## 2020-11-03 DIAGNOSIS — M6281 Muscle weakness (generalized): Secondary | ICD-10-CM

## 2020-11-03 DIAGNOSIS — R262 Difficulty in walking, not elsewhere classified: Secondary | ICD-10-CM | POA: Diagnosis not present

## 2020-11-03 NOTE — Therapy (Signed)
[place] MAIN [facility] SERVICES [address] [place], [place], [address] Phone: [phone]   Fax:  [phone]  Physical Therapy Treatment  Patient Details  Name: Angela Bentley MRN: [id number] Date of Birth: 21-Jul-1941 Referring Provider (PT): Loura Pardon, MD   Encounter Date: 11/03/2020   PT End of Session - 11/03/20 1719    Visit Number 13    Number of Visits 33    Date for PT Re-Evaluation 12/15/20    PT Start Time 9371    PT Stop Time 1514    PT Time Calculation (min) 41 min    Equipment Utilized During Treatment Gait belt    Activity Tolerance Patient tolerated treatment well    Behavior During Therapy [facility] for tasks assessed/performed           Past Medical History:  Diagnosis Date  . Balance problems   . Brain tumor ([place])    a. Left intraventricular tumor ->stable by 06/2017 MRI. Followed @ [facility].  . Gait disturbance    a. Unsteady on feet/balance difficulty.  . Generalized osteoarthritis of multiple sites   . GERD (gastroesophageal reflux disease)   . History of DVT (deep vein thrombosis) 2016   estrogen and airflights. Rx with xarelto for 3 months  . History of stress test    a. 11/2016 ETT: No ST/T changes.  Performed to assess PAC/PVC burden with activity ->No ectopy noted.  . Hypertension   . Hyperthyroidism    treated briefly in college  . Insomnia   . Obesity   . PAF (paroxysmal atrial fibrillation) ([place])    a. 12/2016 Event Monitor: brief run of PAF-->CHA2DS2VASc = 5--> Xarelto.  . Plantar fasciitis   . Retinal tear of left eye   . Rosacea   . Squamous cell carcinoma of skin 09/07/2017   R dorsum of hand   . Type 2 diabetes, controlled, with peripheral neuropathy ([place])    a. 11/2016 A1c 7.4.  . Uterine fibroid     Past Surgical History:  Procedure Laterality Date  . CARPAL TUNNEL RELEASE Right    Dr Burney Gauze  . CATARACT EXTRACTION    . 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 RETINAL TEAR REPAIR CRYOTHERAPY     10/09/13, then again 3/15    There were no vitals filed for this visit.  Therapeutic exercise: used 1.5# on RLE and 2# on LLE for all therex except STS  Standing hip abduction 2x12,  2x12  Standing marches 2x20  STS 2x8, 1x7 VC for technique; pt reported fatigue and unable to complete last rep   Seated BTB hamstring curl 2x20  Seated LAQ - 3x15 B LEs   Seated adductor squeezes with red ball - 2x20  Seated hamstring curl GTB - 3x15 B LES; latex free theraband  Assessment: Pt with some increased pain with standing therex at bar, but when cued to not fully extend/"lock" knee, and perform with slight knee flexion pt reported improved sx. Pt also with difficulty with STS and was unable to complete 8th rep on third set. Pt would benefit from further skilled therapy to improve pain, LE mobility, and strength to improve QOL.      PT Short Term Goals - 10/20/20 1738      PT SHORT TERM GOAL #1   Title Patient will be adherent to HEP at least 3x a week to improve functional strength and balance for better safety at home.  Baseline 10/20/2020 Pt reports she has not done HEP in past 2 weeks    Time 4    Period Weeks    Status On-going    Target Date 11/17/20      PT SHORT TERM GOAL #2   Title Patient will increase RLE knee AROM extension to within 5 degrees of neutral for better standing and walking tolerance;     Baseline 10/20/2020 partially met pt 5 degrees from neutral    Time 4    Period Weeks    Status Partially Met    Target Date 11/17/20      PT SHORT TERM GOAL #3   Title Patient will reports maximum of 1 sleep disturbances per night over last 3 days to exhibit improved sleeping tolerance with less knee pain and reduced fatigue.     Baseline 10/20/2020 Pt reports sleep has been disturbed due to pain for past couple nights    Time 4    Period Weeks    Status On-going    Target Date 11/17/20             PT Long  Term Goals - 10/29/20 1644      PT LONG TERM GOAL #1   Title Patient will increase BLE gross strength to 4+/5 as to improve functional strength for independent gait, increased standing tolerance and increased ADL ability.    Baseline 10/20/2020 MMT: 5/5 hip abduction/adduction, hip flexion 4+/5 B, Knee flexion R 5/5, L 4+/5, knee extension 5/5 B;  ankle PF/DF both  5/5 B, grossly 4+/5    Time 8    Period Weeks    Status Achieved      PT LONG TERM GOAL #2   Title Patient will improve FOTO score to >55/100 to indicate improved functional mobility with ADLs.    Baseline 10/20/2020 deferred to next session; 10/29/2020 FOTO 59%    Time 8    Period Weeks    Status Achieved      PT LONG TERM GOAL #3   Title Patient will report a worst pain of 3/10 on VAS in      right knee       to improve tolerance with ADLs and reduced symptoms with activities.     Baseline Pt reports 1/10 R knee pain in last week    Time 8    Period Weeks    Status Achieved      PT LONG TERM GOAL #4   Title Patient will complete >1000 feet on 6 min walk test without increase in knee pain to improve community ambulator distance for functional tasks and return to PLOF.    Baseline 10/20/2020 pt completed 1085 ft with no reports of knee pain    Time 8    Period Weeks    Status Achieved      PT LONG TERM GOAL #5   Title Patient will increase 10 meter walk test to >1.68ms as to improve gait speed for better community ambulation and to reduce fall risk.    Baseline 10/20/2020 0.8 m/s    Time 8    Period Weeks    Status On-going                 Plan - 11/03/20 1721    Clinical Impression Statement Pt with some increased pain with standing therex at bar, but when cued to not fully extend/"lock" knee, and perform with slight knee flexion pt reported improved sx. Pt also with difficulty with  STS and was unable to complete 8th rep on third set. Pt would benefit from further skilled therapy to improve pain, LE mobility, and  strength to improve QOL.    Personal Factors and Comorbidities Age;Comorbidity 3+;Time since onset of injury/illness/exacerbation    Comorbidities HTN, OA, retinal tear in left eye with impaired vision, ringing in left ear, imbalance, A-fib, DMx2 controlled, insomnia, obesity, left intraventricular meningioma- treated with gamma knife in Nov 2021    Examination-Activity Limitations Locomotion Level;Sleep;Squat;Stairs;Stand;Transfers    Examination-Participation Restrictions Cleaning;Community Activity;Shop;Volunteer;Yard Work    [employer] Evolving/Moderate complexity    Rehab Potential Good    PT Frequency 2x / week    PT Duration 8 weeks    PT Treatment/Interventions ADLs/Self Care Home Management;Cryotherapy;Moist Heat;Gait training;Stair training;Functional mobility training;Therapeutic activities;Therapeutic exercise;Balance training;Neuromuscular re-education;Patient/family education;Orthotic Fit/Training;Manual techniques;Passive range of motion;Dry needling;Energy conservation;Taping    PT Next Visit Plan continue manual therapy and strengthening    PT Home Exercise Plan Provide handout of updated HEP next session    Consulted and Agree with Plan of Care Patient           Patient will benefit from skilled therapeutic intervention in order to improve the following deficits and impairments:  Abnormal gait,Decreased balance,Decreased endurance,Decreased mobility,Difficulty walking,Obesity,Decreased range of motion,Decreased activity tolerance,Decreased strength,Pain  Visit Diagnosis: Muscle weakness (generalized)  Unsteadiness on feet  Chronic pain of right knee  Difficulty in walking, not elsewhere classified     Problem List Patient Active Problem List   Diagnosis Date Noted  . Right knee pain 07/10/2020  . Hoarseness of voice 04/21/2020  . Overactive bladder 10/09/2019  . Tinnitus, bilateral 11/28/2017  . Meningioma ([place]) 09/21/2017  . PAF  (paroxysmal atrial fibrillation) ([place]) 02/03/2017  . Preventative health care 06/02/2016  . Advance directive discussed with patient 06/02/2016  . Hypertension   . Type 2 diabetes, controlled, with peripheral neuropathy ([place])   . Generalized osteoarthritis of multiple sites   . GERD (gastroesophageal reflux disease)   . Hyperthyroidism   . Retinal hole of right eye 06/26/2014  . Amblyopia, right 05/08/2014  . Status post intraocular lens implant 05/08/2014   Ricard Dillon PT, DPT 11/03/2020, 5:23 PM  [place] MAIN [facility] SERVICES [address] [place], [place], [address] Phone: [phone]   Fax:  [phone]  Name: Angela Bentley MRN: [id number] Date of Birth: 1941-04-07

## 2020-11-04 ENCOUNTER — Ambulatory Visit: Payer: Medicare Other

## 2020-11-05 ENCOUNTER — Telehealth: Payer: Self-pay

## 2020-11-05 ENCOUNTER — Ambulatory Visit: Payer: Medicare Other

## 2020-11-05 ENCOUNTER — Other Ambulatory Visit: Payer: Self-pay

## 2020-11-05 DIAGNOSIS — R262 Difficulty in walking, not elsewhere classified: Secondary | ICD-10-CM

## 2020-11-05 DIAGNOSIS — G8929 Other chronic pain: Secondary | ICD-10-CM

## 2020-11-05 DIAGNOSIS — M25561 Pain in right knee: Secondary | ICD-10-CM | POA: Diagnosis not present

## 2020-11-05 DIAGNOSIS — R2681 Unsteadiness on feet: Secondary | ICD-10-CM | POA: Diagnosis not present

## 2020-11-05 DIAGNOSIS — M6281 Muscle weakness (generalized): Secondary | ICD-10-CM

## 2020-11-05 NOTE — Therapy (Signed)
[place] MAIN [facility] SERVICES [address] [place], [place], [address] Phone: [phone]   Fax:  [phone]  Physical Therapy Treatment  Patient Details  Name: Angela Bentley MRN: [id number] Date of Birth: 1941/08/01 Referring Provider (PT): Loura Pardon, MD   Encounter Date: 11/05/2020   PT End of Session - 11/05/20 1453    Visit Number 14    Number of Visits 33    Date for PT Re-Evaluation 12/15/20    PT Start Time 1105    PT Stop Time 1145    PT Time Calculation (min) 40 min    Equipment Utilized During Treatment Gait belt    Activity Tolerance Patient tolerated treatment well    Behavior During Therapy [facility] for tasks assessed/performed           Past Medical History:  Diagnosis Date  . Balance problems   . Brain tumor ([place])    a. Left intraventricular tumor ->stable by 06/2017 MRI. Followed @ [facility].  . Gait disturbance    a. Unsteady on feet/balance difficulty.  . Generalized osteoarthritis of multiple sites   . GERD (gastroesophageal reflux disease)   . History of DVT (deep vein thrombosis) 2016   estrogen and airflights. Rx with xarelto for 3 months  . History of stress test    a. 11/2016 ETT: No ST/T changes.  Performed to assess PAC/PVC burden with activity ->No ectopy noted.  . Hypertension   . Hyperthyroidism    treated briefly in college  . Insomnia   . Obesity   . PAF (paroxysmal atrial fibrillation) ([place])    a. 12/2016 Event Monitor: brief run of PAF-->CHA2DS2VASc = 5--> Xarelto.  . Plantar fasciitis   . Retinal tear of left eye   . Rosacea   . Squamous cell carcinoma of skin 09/07/2017   R dorsum of hand   . Type 2 diabetes, controlled, with peripheral neuropathy ([place])    a. 11/2016 A1c 7.4.  . Uterine fibroid     Past Surgical History:  Procedure Laterality Date  . CARPAL TUNNEL RELEASE Right    Dr Burney Gauze  . CATARACT EXTRACTION    . 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 RETINAL TEAR REPAIR CRYOTHERAPY     10/09/13, then again 3/15    There were no vitals filed for this visit.   Treatment: 40  Neuromuscular Re-Education:  Korebalance: performed for emphasis on static balance, and weight-shifts/ankle proprioception; CGA for all Korebalance exercises  Penguin race 4x, improved meeting targets with repetition Maze 1x - Pt unable to complete level due to difficulty performing smaller weight-shifts Scale - 4x,   Therapeutic Exercise  Isometric hip abduction with 10 x 5 second hold for pain modulation  LAQ x multiple repetitions Seated marches with 3# AW 2x20   Assessment: Pt with difficulty performing small weight-shifts forward, backward, and laterally, with greatest difficulty with lateral weight-shifting. She was unable to complete Korebalance maze due to this difficulty. However, she did improve on hitting targets on other balance exercise with repetition within session. Pt will benefit from further skilled therapy to improve BLE strength, motor control and balance to improve QOL.    PT Education - 11/05/20 1452    Education Details Pt educated on technique with Kore Balance exercises, bodymechanics and weight-shifts    Person(s) Educated Patient    Methods Explanation;Tactile cues;Verbal cues    Comprehension Verbalized understanding;Returned demonstration  PT Short Term Goals - 10/20/20 1738      PT SHORT TERM GOAL #1   Title Patient will be adherent to HEP at least 3x a week to improve functional strength and balance for better safety at home.    Baseline 10/20/2020 Pt reports she has not done HEP in past 2 weeks    Time 4    Period Weeks    Status On-going    Target Date 11/17/20      PT SHORT TERM GOAL #2   Title Patient will increase RLE knee AROM extension to within 5 degrees of neutral for better standing and walking tolerance;     Baseline 10/20/2020 partially met pt 5 degrees from neutral    Time 4     Period Weeks    Status Partially Met    Target Date 11/17/20      PT SHORT TERM GOAL #3   Title Patient will reports maximum of 1 sleep disturbances per night over last 3 days to exhibit improved sleeping tolerance with less knee pain and reduced fatigue.     Baseline 10/20/2020 Pt reports sleep has been disturbed due to pain for past couple nights    Time 4    Period Weeks    Status On-going    Target Date 11/17/20             PT Long Term Goals - 10/29/20 1644      PT LONG TERM GOAL #1   Title Patient will increase BLE gross strength to 4+/5 as to improve functional strength for independent gait, increased standing tolerance and increased ADL ability.    Baseline 10/20/2020 MMT: 5/5 hip abduction/adduction, hip flexion 4+/5 B, Knee flexion R 5/5, L 4+/5, knee extension 5/5 B;  ankle PF/DF both  5/5 B, grossly 4+/5    Time 8    Period Weeks    Status Achieved      PT LONG TERM GOAL #2   Title Patient will improve FOTO score to >55/100 to indicate improved functional mobility with ADLs.    Baseline 10/20/2020 deferred to next session; 10/29/2020 FOTO 59%    Time 8    Period Weeks    Status Achieved      PT LONG TERM GOAL #3   Title Patient will report a worst pain of 3/10 on VAS in      right knee       to improve tolerance with ADLs and reduced symptoms with activities.     Baseline Pt reports 1/10 R knee pain in last week    Time 8    Period Weeks    Status Achieved      PT LONG TERM GOAL #4   Title Patient will complete >1000 feet on 6 min walk test without increase in knee pain to improve community ambulator distance for functional tasks and return to PLOF.    Baseline 10/20/2020 pt completed 1085 ft with no reports of knee pain    Time 8    Period Weeks    Status Achieved      PT LONG TERM GOAL #5   Title Patient will increase 10 meter walk test to >1.1ms as to improve gait speed for better community ambulation and to reduce fall risk.    Baseline 10/20/2020 0.8 m/s     Time 8    Period Weeks    Status On-going  Plan - 11/05/20 1503    Clinical Impression Statement Pt with difficulty performing small weight-shifts forward, backward, and laterally, with greatest difficulty with lateral weight-shifting. She was unable to complete Korebalance maze due to this difficulty. However, she did improve on hitting targets on other balance exercise with repetition within session. Pt will benefit from further skilled therapy to improve BLE strength, motor control and balance to improve QOL.    Personal Factors and Comorbidities Age;Comorbidity 3+;Time since onset of injury/illness/exacerbation    Comorbidities HTN, OA, retinal tear in left eye with impaired vision, ringing in left ear, imbalance, A-fib, DMx2 controlled, insomnia, obesity, left intraventricular meningioma- treated with gamma knife in Nov 2021    Examination-Activity Limitations Locomotion Level;Sleep;Squat;Stairs;Stand;Transfers    Examination-Participation Restrictions Cleaning;Community Activity;Shop;Volunteer;Yard Work    [employer] officer Evolving/Moderate complexity    Rehab Potential Good    PT Frequency 2x / week    PT Duration 8 weeks    PT Treatment/Interventions ADLs/Self Care Home Management;Cryotherapy;Moist Heat;Gait training;Stair training;Functional mobility training;Therapeutic activities;Therapeutic exercise;Balance training;Neuromuscular re-education;Patient/family education;Orthotic Fit/Training;Manual techniques;Passive range of motion;Dry needling;Energy conservation;Taping    PT Next Visit Plan continue manual therapy and strengthening, weight-shifting exercises forward/backward/lateral    PT Home Exercise Plan Provide handout of updated HEP next session    Consulted and Agree with Plan of Care Patient           Patient will benefit from skilled therapeutic intervention in order to improve the following deficits and impairments:  Abnormal  gait,Decreased balance,Decreased endurance,Decreased mobility,Difficulty walking,Obesity,Decreased range of motion,Decreased activity tolerance,Decreased strength,Pain  Visit Diagnosis: Muscle weakness (generalized)  Unsteadiness on feet  Chronic pain of right knee  Difficulty in walking, not elsewhere classified     Problem List Patient Active Problem List   Diagnosis Date Noted  . Right knee pain 07/10/2020  . Hoarseness of voice 04/21/2020  . Overactive bladder 10/09/2019  . Tinnitus, bilateral 11/28/2017  . Meningioma ([place]) 09/21/2017  . PAF (paroxysmal atrial fibrillation) ([place]) 02/03/2017  . Preventative health care 06/02/2016  . Advance directive discussed with patient 06/02/2016  . Hypertension   . Type 2 diabetes, controlled, with peripheral neuropathy ([place])   . Generalized osteoarthritis of multiple sites   . GERD (gastroesophageal reflux disease)   . Hyperthyroidism   . Retinal hole of right eye 06/26/2014  . Amblyopia, right 05/08/2014  . Status post intraocular lens implant 05/08/2014   Ricard Dillon PT, DPT  11/05/2020, 3:09 PM  [place] MAIN [facility] SERVICES [address] [place], [place], [address] Phone: [phone]   Fax:  [phone]  Name: Angela Bentley MRN: [id number] Date of Birth: 02-27-1941

## 2020-11-05 NOTE — Telephone Encounter (Signed)
Currently awaiting benefits from [employer] portal.

## 2020-11-09 ENCOUNTER — Ambulatory Visit: Payer: Medicare Other

## 2020-11-09 ENCOUNTER — Other Ambulatory Visit: Payer: Self-pay

## 2020-11-09 DIAGNOSIS — R262 Difficulty in walking, not elsewhere classified: Secondary | ICD-10-CM

## 2020-11-09 DIAGNOSIS — R2681 Unsteadiness on feet: Secondary | ICD-10-CM

## 2020-11-09 DIAGNOSIS — G8929 Other chronic pain: Secondary | ICD-10-CM | POA: Diagnosis not present

## 2020-11-09 DIAGNOSIS — M6281 Muscle weakness (generalized): Secondary | ICD-10-CM

## 2020-11-09 DIAGNOSIS — M25561 Pain in right knee: Secondary | ICD-10-CM | POA: Diagnosis not present

## 2020-11-09 NOTE — Therapy (Signed)
[place] MAIN [facility] SERVICES [address] [place], [place], [address] Phone: [phone]   Fax:  [phone]  Physical Therapy Treatment  Patient Details  Name: Angela Bentley MRN: [id number] Date of Birth: 1940-12-25 Referring Provider (PT): Loura Pardon, MD   Encounter Date: 11/09/2020   PT End of Session - 11/09/20 1253    Visit Number 15    Number of Visits 33    Date for PT Re-Evaluation 12/15/20    PT Start Time 1104    PT Stop Time 1147    PT Time Calculation (min) 43 min    Equipment Utilized During Treatment Gait belt    Activity Tolerance Patient tolerated treatment well    Behavior During Therapy [facility] for tasks assessed/performed           Past Medical History:  Diagnosis Date  . Balance problems   . Brain tumor ([place])    a. Left intraventricular tumor ->stable by 06/2017 MRI. Followed @ [facility].  . Gait disturbance    a. Unsteady on feet/balance difficulty.  . Generalized osteoarthritis of multiple sites   . GERD (gastroesophageal reflux disease)   . History of DVT (deep vein thrombosis) 2016   estrogen and airflights. Rx with xarelto for 3 months  . History of stress test    a. 11/2016 ETT: No ST/T changes.  Performed to assess PAC/PVC burden with activity ->No ectopy noted.  . Hypertension   . Hyperthyroidism    treated briefly in college  . Insomnia   . Obesity   . PAF (paroxysmal atrial fibrillation) ([place])    a. 12/2016 Event Monitor: brief run of PAF-->CHA2DS2VASc = 5--> Xarelto.  . Plantar fasciitis   . Retinal tear of left eye   . Rosacea   . Squamous cell carcinoma of skin 09/07/2017   R dorsum of hand   . Type 2 diabetes, controlled, with peripheral neuropathy ([place])    a. 11/2016 A1c 7.4.  . Uterine fibroid     Past Surgical History:  Procedure Laterality Date  . CARPAL TUNNEL RELEASE Right    Dr Burney Gauze  . CATARACT EXTRACTION    . 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 RETINAL TEAR REPAIR CRYOTHERAPY     10/09/13, then again 3/15    There were no vitals filed for this visit.   Neuromuscular Re-Education:   Korebalance: performed for emphasis on static balance, and weight-shifts/ankle proprioception; CGA for all Korebalance exercises  Scale - 8x, improvement this session with scaling weight-shifts. Ambulating with horizontal/vertical x multiple reps each, variability with BOS with horizontal. Standing visual tracking of ball "around-the-world" CC and CW - x multiple reps Ambulating visual tracking of ball side-to-side and "around-the-world" CC and CW- x multiple reps   Dix hallpike: negative B     PT Education - 11/09/20 1252    Education Details Pt educated on "around-the-world" balance exercise    Person(s) Educated Patient    Methods Explanation;Demonstration;Verbal cues    Comprehension Verbalized understanding;Returned demonstration            PT Short Term Goals - 10/20/20 1738      PT SHORT TERM GOAL #1   Title Patient will be adherent to HEP at least 3x a week to improve functional strength and balance for better safety at home.    Baseline 10/20/2020 Pt reports she has not done HEP in past 2 weeks    Time 4  Period Weeks    Status On-going    Target Date 11/17/20      PT SHORT TERM GOAL #2   Title Patient will increase RLE knee AROM extension to within 5 degrees of neutral for better standing and walking tolerance;     Baseline 10/20/2020 partially met pt 5 degrees from neutral    Time 4    Period Weeks    Status Partially Met    Target Date 11/17/20      PT SHORT TERM GOAL #3   Title Patient will reports maximum of 1 sleep disturbances per night over last 3 days to exhibit improved sleeping tolerance with less knee pain and reduced fatigue.     Baseline 10/20/2020 Pt reports sleep has been disturbed due to pain for past couple nights    Time 4    Period Weeks    Status On-going    Target Date 11/17/20              PT Long Term Goals - 10/29/20 1644      PT LONG TERM GOAL #1   Title Patient will increase BLE gross strength to 4+/5 as to improve functional strength for independent gait, increased standing tolerance and increased ADL ability.    Baseline 10/20/2020 MMT: 5/5 hip abduction/adduction, hip flexion 4+/5 B, Knee flexion R 5/5, L 4+/5, knee extension 5/5 B;  ankle PF/DF both  5/5 B, grossly 4+/5    Time 8    Period Weeks    Status Achieved      PT LONG TERM GOAL #2   Title Patient will improve FOTO score to >55/100 to indicate improved functional mobility with ADLs.    Baseline 10/20/2020 deferred to next session; 10/29/2020 FOTO 59%    Time 8    Period Weeks    Status Achieved      PT LONG TERM GOAL #3   Title Patient will report a worst pain of 3/10 on VAS in      right knee       to improve tolerance with ADLs and reduced symptoms with activities.     Baseline Pt reports 1/10 R knee pain in last week    Time 8    Period Weeks    Status Achieved      PT LONG TERM GOAL #4   Title Patient will complete >1000 feet on 6 min walk test without increase in knee pain to improve community ambulator distance for functional tasks and return to PLOF.    Baseline 10/20/2020 pt completed 1085 ft with no reports of knee pain    Time 8    Period Weeks    Status Achieved      PT LONG TERM GOAL #5   Title Patient will increase 10 meter walk test to >1.79ms as to improve gait speed for better community ambulation and to reduce fall risk.    Baseline 10/20/2020 0.8 m/s    Time 8    Period Weeks    Status On-going                 Plan - 11/09/20 1254    Clinical Impression Statement Pt with decreased postural stability and LOB with "around-the-world" seated and standing exercises as well as with gait with horizontal head turns, requiring CGA-min assist from PT to regain balance. Dix hallpike testing was negative B. Pt will benefit from further skilled therapy to improve pt  mobility, B LE strength and balance.  Personal Factors and Comorbidities Age;Comorbidity 3+;Time since onset of injury/illness/exacerbation    Comorbidities HTN, OA, retinal tear in left eye with impaired vision, ringing in left ear, imbalance, A-fib, DMx2 controlled, insomnia, obesity, left intraventricular meningioma- treated with gamma knife in Nov 2021    Examination-Activity Limitations Locomotion Level;Sleep;Squat;Stairs;Stand;Transfers    Examination-Participation Restrictions Cleaning;Community Activity;Shop;Volunteer;Yard Work    [employer]chant [employer] officer Evolving/Moderate complexity    Rehab Potential Good    PT Frequency 2x / week    PT Duration 8 weeks    PT Treatment/Interventions ADLs/Self Care Home Management;Cryotherapy;Moist Heat;Gait training;Stair training;Functional mobility training;Therapeutic activities;Therapeutic exercise;Balance training;Neuromuscular re-education;Patient/family education;Orthotic Fit/Training;Manual techniques;Passive range of motion;Dry needling;Energy conservation;Taping    PT Next Visit Plan continue manual therapy and strengthening, weight-shifting exercises forward/backward/lateral, progress around-the-world and visual tracking balance exercises    PT Home Exercise Plan Provide handout of updated HEP next session    Consulted and Agree with Plan of Care Patient           Patient will benefit from skilled therapeutic intervention in order to improve the following deficits and impairments:  Abnormal gait,Decreased balance,Decreased endurance,Decreased mobility,Difficulty walking,Obesity,Decreased range of motion,Decreased activity tolerance,Decreased strength,Pain  Visit Diagnosis: Muscle weakness (generalized)  Unsteadiness on feet  Chronic pain of right knee  Difficulty in walking, not elsewhere classified     Problem List Patient Active Problem List   Diagnosis Date Noted  . Right knee pain 07/10/2020  . Hoarseness of  voice 04/21/2020  . Overactive bladder 10/09/2019  . Tinnitus, bilateral 11/28/2017  . Meningioma ([place]) 09/21/2017  . PAF (paroxysmal atrial fibrillation) ([place]) 02/03/2017  . Preventative health care 06/02/2016  . Advance directive discussed with patient 06/02/2016  . Hypertension   . Type 2 diabetes, controlled, with peripheral neuropathy ([place])   . Generalized osteoarthritis of multiple sites   . GERD (gastroesophageal reflux disease)   . Hyperthyroidism   . Retinal hole of right eye 06/26/2014  . Amblyopia, right 05/08/2014  . Status post intraocular lens implant 05/08/2014    Ricard Dillon PT, DPT  11/09/2020, 12:58 PM  [place] MAIN [facility] SERVICES [address] [place], [place], [address] Phone: [phone]   Fax:  [phone]  Name: Breely Panik MRN: [id number] Date of Birth: [birth year]

## 2020-11-10 ENCOUNTER — Ambulatory Visit: Payer: Medicare Other

## 2020-11-11 ENCOUNTER — Telehealth: Payer: Self-pay | Admitting: Orthopedic Surgery

## 2020-11-11 NOTE — Telephone Encounter (Signed)
Pt called stating she got a knee injection on 10/07/20 and she discussed getting a hyaluronic injection next if this one didn't help. Pt states her knee is still giving her pain and so she would like to move to the next injection and wanted to make sure our office would have I by 11/26/20

## 2020-11-11 NOTE — Telephone Encounter (Signed)
Can you please get auth for this?

## 2020-11-12 ENCOUNTER — Ambulatory Visit: Payer: Medicare Other

## 2020-11-12 ENCOUNTER — Other Ambulatory Visit: Payer: Self-pay

## 2020-11-12 DIAGNOSIS — M6281 Muscle weakness (generalized): Secondary | ICD-10-CM | POA: Diagnosis not present

## 2020-11-12 DIAGNOSIS — M25561 Pain in right knee: Secondary | ICD-10-CM | POA: Diagnosis not present

## 2020-11-12 DIAGNOSIS — R262 Difficulty in walking, not elsewhere classified: Secondary | ICD-10-CM

## 2020-11-12 DIAGNOSIS — R2681 Unsteadiness on feet: Secondary | ICD-10-CM | POA: Diagnosis not present

## 2020-11-12 DIAGNOSIS — G8929 Other chronic pain: Secondary | ICD-10-CM | POA: Diagnosis not present

## 2020-11-12 NOTE — Therapy (Signed)
[place] MAIN [facility] SERVICES [address] [place], [place], [address] Phone: [phone]   Fax:  [phone]  Physical Therapy Treatment  Patient Details  Name: Angela Bentley MRN: [id number] Date of Birth: 1941/02/19 Referring Provider (PT): Loura Pardon, MD   Encounter Date: 11/12/2020   PT End of Session - 11/12/20 1422    Visit Number 16    Number of Visits 33    Date for PT Re-Evaluation 12/15/20    PT Start Time 0840    PT Stop Time 0926    PT Time Calculation (min) 46 min    Equipment Utilized During Treatment Gait belt    Activity Tolerance Patient tolerated treatment well    Behavior During Therapy [facility] for tasks assessed/performed           Past Medical History:  Diagnosis Date  . Balance problems   . Brain tumor ([place])    a. Left intraventricular tumor ->stable by 06/2017 MRI. Followed @ [facility].  . Gait disturbance    a. Unsteady on feet/balance difficulty.  . Generalized osteoarthritis of multiple sites   . GERD (gastroesophageal reflux disease)   . History of DVT (deep vein thrombosis) 2016   estrogen and airflights. Rx with xarelto for 3 months  . History of stress test    a. 11/2016 ETT: No ST/T changes.  Performed to assess PAC/PVC burden with activity ->No ectopy noted.  . Hypertension   . Hyperthyroidism    treated briefly in college  . Insomnia   . Obesity   . PAF (paroxysmal atrial fibrillation) ([place])    a. 12/2016 Event Monitor: brief run of PAF-->CHA2DS2VASc = 5--> Xarelto.  . Plantar fasciitis   . Retinal tear of left eye   . Rosacea   . Squamous cell carcinoma of skin 09/07/2017   R dorsum of hand   . Type 2 diabetes, controlled, with peripheral neuropathy ([place])    a. 11/2016 A1c 7.4.  . Uterine fibroid     Past Surgical History:  Procedure Laterality Date  . CARPAL TUNNEL RELEASE Right    Dr Burney Gauze  . CATARACT EXTRACTION    . 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 RETINAL TEAR REPAIR CRYOTHERAPY     10/09/13, then again 3/15    There were no vitals filed for this visit.   Subjective Assessment - 11/12/20 0839    Subjective Pt reports R knee pain woke her up last night. Pt says she was able to go back to sleep. She thinks she is twisting it in her sleep. Pt reports she places pillow under knee and lays on her back to make it feel better although she does not prefer sleeping on her back. Pt reports she made an appointment for next Wednesday with her doctor regarding knee pain.    Pertinent History 80 yo Female reports recent new onset of LLE hip pain which started mid October 2021. As a result she is having increased RLE knee pain. She did get X-rays of left hip which shows no arthritis or acute injury. She reports since the hip started hurting it has actually gotten better, but now the right knee is bothering her more and she is having more of a balance issue. She had a gamma knife procedure for Meningioma on 08/14/20; She reports since having the procedure she has noticed shuffling steps and unsteady gait. She has been using tripod base cane due to hip/knee pain  and has continued using it for balance/stability; She states side effects from gamma knife procedure is possible brain swelling from 6 months to 3 years after procedure. She reports occasional headaches; She is still having ringing in the ears on left side which is chronic. She also denies any change in vision;    How long can you sit comfortably? NA    How long can you stand comfortably? will having some pain with initial standing, but then as she moves around its okay    How long can you walk comfortably? NA    Diagnostic tests Denies any imaging of right knee; MRI of brain shows: There is an enlarging intraventricular tumor of the left lateral ventricle inseparable from cord plexus and inseparable from the left occipital lobe and other portions of the ependyma with possible invasion  of the left parietal lobe and left occipital lobe. There are heterogeneous areas of enhancement of the tumor and different lobules. Differential includes primarily an intraventricular meningioma. However with the enlargement consideration is also given for a tumor of the choroid plexus such as choroid plexus papilloma although less commonly seen in the lateral ventricles in adult patients, and other tumors. The growth appears slow for a cord plexus carcinoma.    Patient Stated Goals "What is going on with the knee, see if I need surgery"    Currently in Pain? Yes    Pain Score 4     Pain Onset 1 to 4 weeks ago          Treatment  Neuromuscular Re-Education:  Korebalance: performed for emphasis on static balance, and weight-shifts/ankle proprioception; CGA for all  Maze x3 Pt reports sensation of decreased balance/equilibrium  Ambulating with horizontal/vertical head turns holding ball/visual tracking x multiple reps each, variability with BOS with horizontal head turns  Therapeutic Exercise:  Hamstring stretching 2x30 sec B LEs  Standing gastrocsol stretch 2x30 sec B LEs  LAQs 2# 2x20  Assessment: Pt continues to have difficulty with fine weight-shifts and requires frequent VC for posterior and lateral shifting. Pt also with decreased postural stability on Korebalance when attempting without BUE support, requiring CGA-min assist from PT to maintain balance. Pt will benefit from further skilled PT to improve BLE strength and balance and to reduce pain.    PT Short Term Goals - 10/20/20 1738      PT SHORT TERM GOAL #1   Title Patient will be adherent to HEP at least 3x a week to improve functional strength and balance for better safety at home.    Baseline 10/20/2020 Pt reports she has not done HEP in past 2 weeks    Time 4    Period Weeks    Status On-going    Target Date 11/17/20      PT SHORT TERM GOAL #2   Title Patient will increase RLE knee AROM extension to within 5  degrees of neutral for better standing and walking tolerance;     Baseline 10/20/2020 partially met pt 5 degrees from neutral    Time 4    Period Weeks    Status Partially Met    Target Date 11/17/20      PT SHORT TERM GOAL #3   Title Patient will reports maximum of 1 sleep disturbances per night over last 3 days to exhibit improved sleeping tolerance with less knee pain and reduced fatigue.     Baseline 10/20/2020 Pt reports sleep has been disturbed due to pain for past couple nights  Time 4    Period Weeks    Status On-going    Target Date 11/17/20             PT Long Term Goals - 10/29/20 1644      PT LONG TERM GOAL #1   Title Patient will increase BLE gross strength to 4+/5 as to improve functional strength for independent gait, increased standing tolerance and increased ADL ability.    Baseline 10/20/2020 MMT: 5/5 hip abduction/adduction, hip flexion 4+/5 B, Knee flexion R 5/5, L 4+/5, knee extension 5/5 B;  ankle PF/DF both  5/5 B, grossly 4+/5    Time 8    Period Weeks    Status Achieved      PT LONG TERM GOAL #2   Title Patient will improve FOTO score to >55/100 to indicate improved functional mobility with ADLs.    Baseline 10/20/2020 deferred to next session; 10/29/2020 FOTO 59%    Time 8    Period Weeks    Status Achieved      PT LONG TERM GOAL #3   Title Patient will report a worst pain of 3/10 on VAS in      right knee       to improve tolerance with ADLs and reduced symptoms with activities.     Baseline Pt reports 1/10 R knee pain in last week    Time 8    Period Weeks    Status Achieved      PT LONG TERM GOAL #4   Title Patient will complete >1000 feet on 6 min walk test without increase in knee pain to improve community ambulator distance for functional tasks and return to PLOF.    Baseline 10/20/2020 pt completed 1085 ft with no reports of knee pain    Time 8    Period Weeks    Status Achieved      PT LONG TERM GOAL #5   Title Patient will increase 10  meter walk test to >1.49ms as to improve gait speed for better community ambulation and to reduce fall risk.    Baseline 10/20/2020 0.8 m/s    Time 8    Period Weeks    Status On-going                 Plan - 11/12/20 1424    Clinical Impression Statement Pt continues to have difficulty with fine weight-shifts and requires frequent VC for posterior and lateral shifting. Pt also with decreased postural stability on Korebalance when attempting without BUE support, requiring CGA-min assist from PT to maintain balance. Pt will benefit from further skilled PT to improve BLE strength and balance and to reduce pain.    Personal Factors and Comorbidities Age;Comorbidity 3+;Time since onset of injury/illness/exacerbation    Comorbidities HTN, OA, retinal tear in left eye with impaired vision, ringing in left ear, imbalance, A-fib, DMx2 controlled, insomnia, obesity, left intraventricular meningioma- treated with gamma knife in Nov 2021    Examination-Activity Limitations Locomotion Level;Sleep;Squat;Stairs;Stand;Transfers    Examination-Participation Restrictions Cleaning;Community Activity;Shop;Volunteer;Yard Work    SMerchant navy officerEvolving/Moderate complexity    Rehab Potential Good    PT Frequency 2x / week    PT Duration 8 weeks    PT Treatment/Interventions ADLs/Self Care Home Management;Cryotherapy;Moist Heat;Gait training;Stair training;Functional mobility training;Therapeutic activities;Therapeutic exercise;Balance training;Neuromuscular re-education;Patient/family education;Orthotic Fit/Training;Manual techniques;Passive range of motion;Dry needling;Energy conservation;Taping    PT Next Visit Plan continue manual therapy and strengthening, weight-shifting exercises forward/backward/lateral, progress around-the-world and visual tracking balance exercises  PT Home Exercise Plan Provide handout of updated HEP next session    Consulted and Agree with Plan of Care Patient            Patient will benefit from skilled therapeutic intervention in order to improve the following deficits and impairments:  Abnormal gait,Decreased balance,Decreased endurance,Decreased mobility,Difficulty walking,Obesity,Decreased range of motion,Decreased activity tolerance,Decreased strength,Pain  Visit Diagnosis: Muscle weakness (generalized)  Unsteadiness on feet  Chronic pain of right knee  Difficulty in walking, not elsewhere classified     Problem List Patient Active Problem List   Diagnosis Date Noted  . Right knee pain 07/10/2020  . Hoarseness of voice 04/21/2020  . Overactive bladder 10/09/2019  . Tinnitus, bilateral 11/28/2017  . Meningioma ([place]) 09/21/2017  . PAF (paroxysmal atrial fibrillation) ([place]) 02/03/2017  . Preventative health care 06/02/2016  . Advance directive discussed with patient 06/02/2016  . Hypertension   . Type 2 diabetes, controlled, with peripheral neuropathy ([place])   . Generalized osteoarthritis of multiple sites   . GERD (gastroesophageal reflux disease)   . Hyperthyroidism   . Retinal hole of right eye 06/26/2014  . Amblyopia, right 05/08/2014  . Status post intraocular lens implant 05/08/2014   Ricard Dillon PT, DPT 11/12/2020, 2:27 PM  [place] MAIN [facility] SERVICES [address] [place], [place], [address] Phone: [phone]   Fax:  [phone]  Name: Angela Bentley MRN: [id number] Date of Birth: 1940/12/15

## 2020-11-12 NOTE — Telephone Encounter (Signed)
Noted  

## 2020-11-16 ENCOUNTER — Telehealth: Payer: Self-pay

## 2020-11-16 ENCOUNTER — Other Ambulatory Visit: Payer: Self-pay

## 2020-11-16 NOTE — Telephone Encounter (Signed)
BV Still Pending for SynviscOne, right knee.

## 2020-11-17 ENCOUNTER — Other Ambulatory Visit: Payer: Self-pay

## 2020-11-17 ENCOUNTER — Ambulatory Visit: Payer: Medicare Other

## 2020-11-17 DIAGNOSIS — R262 Difficulty in walking, not elsewhere classified: Secondary | ICD-10-CM

## 2020-11-17 DIAGNOSIS — G8929 Other chronic pain: Secondary | ICD-10-CM

## 2020-11-17 DIAGNOSIS — M6281 Muscle weakness (generalized): Secondary | ICD-10-CM

## 2020-11-17 DIAGNOSIS — R2681 Unsteadiness on feet: Secondary | ICD-10-CM

## 2020-11-17 DIAGNOSIS — M25561 Pain in right knee: Secondary | ICD-10-CM

## 2020-11-17 NOTE — Therapy (Signed)
[place] MAIN [facility] SERVICES [address] [place], [place], [address] Phone: [phone]   Fax:  [phone]  Physical Therapy Treatment  Patient Details  Name: Angela Bentley MRN: [id number] Date of Birth: [birth year] Referring Provider (PT): Loura Pardon, MD   Encounter Date: 11/17/2020   PT End of Session - 11/17/20 1727    Visit Number 17    Number of Visits 33    Date for PT Re-Evaluation 12/15/20    PT Start Time 9450    PT Stop Time 1428    PT Time Calculation (min) 39 min    Equipment Utilized During Treatment Gait belt    Activity Tolerance Patient tolerated treatment well    Behavior During Therapy [facility] for tasks assessed/performed           Past Medical History:  Diagnosis Date  . Actinic keratosis 06/25/2020   L pretibia distal - bx proven   . Balance problems   . Brain tumor ([place])    a. Left intraventricular tumor ->stable by 06/2017 MRI. Followed @ [facility].  Marland Kitchen Dysplastic nevus 06/25/2020   R flank - moderate  . Gait disturbance    a. Unsteady on feet/balance difficulty.  . Generalized osteoarthritis of multiple sites   . GERD (gastroesophageal reflux disease)   . History of DVT (deep vein thrombosis) 2016   estrogen and airflights. Rx with xarelto for 3 months  . History of stress test    a. 11/2016 ETT: No ST/T changes.  Performed to assess PAC/PVC burden with activity ->No ectopy noted.  . Hypertension   . Hyperthyroidism    treated briefly in college  . Insomnia   . Obesity   . PAF (paroxysmal atrial fibrillation) ([place])    a. 12/2016 Event Monitor: brief run of PAF-->CHA2DS2VASc = 5--> Xarelto.  . Plantar fasciitis   . Retinal tear of left eye   . Rosacea   . Squamous cell carcinoma of skin 09/07/2017   R dorsum of hand   . Type 2 diabetes, controlled, with peripheral neuropathy ([place])    a. 11/2016 A1c 7.4.  . Uterine fibroid     Past Surgical History:  Procedure Laterality Date  . CARPAL TUNNEL RELEASE  Right    Dr Burney Gauze  . CATARACT EXTRACTION    . 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 RETINAL TEAR REPAIR CRYOTHERAPY     10/09/13, then again 3/15    There were no vitals filed for this visit.   Subjective Assessment - 11/17/20 1348    Subjective PT reports she did not have good night's sleep. Pt  says she went [facility] and says her R knee felt stiff, but performing knee extension/flexion helped. Pt states she fell yesterday. She says she was walking her dog and did not take her cane with her. Pt reports she was walking on a small hill and accidently stepped off the curb with her L foot and fell down. Pt presents with large scrapes and and scabs on R anterior knee on lateral border and also just inferior to knee. Pt also wtih scrape on R cheek. Pt denies HA, dizziness, or nausea or other sx.    Pertinent History 80 yo Female reports recent new onset of LLE hip pain which started mid October 2021. As a result she is having increased RLE knee pain. She did get X-rays of left hip which shows no arthritis or acute injury. She  reports since the hip started hurting it has actually gotten better, but now the right knee is bothering her more and she is having more of a balance issue. She had a gamma knife procedure for Meningioma on 08/14/20; She reports since having the procedure she has noticed shuffling steps and unsteady gait. She has been using tripod base cane due to hip/knee pain and has continued using it for balance/stability; She states side effects from gamma knife procedure is possible brain swelling from 6 months to 3 years after procedure. She reports occasional headaches; She is still having ringing in the ears on left side which is chronic. She also denies any change in vision;    How long can you sit comfortably? NA    How long can you stand comfortably? will having some pain with initial standing, but then as she moves around its  okay    How long can you walk comfortably? 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 There are heterogeneous areas of enhancement of the tumor and different lobules. Differential includes primarily an intraventricular meningioma. However with the enlargement consideration is also given for a tumor of the choroid plexus such as choroid plexus papilloma although less commonly seen in the lateral ventricles in adult patients, and other tumors. The growth appears slow for a cord plexus carcinoma.    Patient Stated Goals "What is going on with the knee, see if I need surgery"    Currently in Pain? Yes    Pain Location Knee    Pain Orientation Right    Pain Onset 1 to 4 weeks ago          Treatment   Neuromuscular Re-Education:   Korebalance: performed for emphasis on static balance, and weight-shifts/ankle proprioception; CGA for all  Maze x2, improved weight-shifts and speed.   In // bars: Reactive balance strategies posterior and to the L side x several minutes/reps for both. Absent or delayed, small stepping strategy noted.   Therapeutic Exercise:   Hamstring stretching 2x30 sec B LEs    LAQs 2.5# 2x20 pt rates exercise as easy-medium  Seated marches 2.5# 2x20     PT Education - 11/17/20 1726    Education Details Pt educated on stepping strategy, body mechanics    Person(s) Educated Patient    Methods Explanation;Demonstration;Verbal cues    Comprehension Verbalized understanding;Returned demonstration            PT Short Term Goals - 10/20/20 1738      PT SHORT TERM GOAL #1   Title Patient will be adherent to HEP at least 3x a week to improve functional strength and balance for better  safety at home.    Baseline 10/20/2020 Pt reports she has not done HEP in past 2 weeks    Time 4    Period Weeks    Status On-going    Target Date 11/17/20      PT SHORT TERM GOAL #2   Title Patient will increase RLE knee AROM extension to within 5 degrees of neutral for better standing and walking tolerance;     Baseline 10/20/2020 partially met pt 5 degrees from neutral    Time 4    Period Weeks  Status Partially Met    Target Date 11/17/20      PT SHORT TERM GOAL #3   Title Patient will reports maximum of 1 sleep disturbances per night over last 3 days to exhibit improved sleeping tolerance with less knee pain and reduced fatigue.     Baseline 10/20/2020 Pt reports sleep has been disturbed due to pain for past couple nights    Time 4    Period Weeks    Status On-going    Target Date 11/17/20             PT Long Term Goals - 10/29/20 1644      PT LONG TERM GOAL #1   Title Patient will increase BLE gross strength to 4+/5 as to improve functional strength for independent gait, increased standing tolerance and increased ADL ability.    Baseline 10/20/2020 MMT: 5/5 hip abduction/adduction, hip flexion 4+/5 B, Knee flexion R 5/5, L 4+/5, knee extension 5/5 B;  ankle PF/DF both  5/5 B, grossly 4+/5    Time 8    Period Weeks    Status Achieved      PT LONG TERM GOAL #2   Title Patient will improve FOTO score to >55/100 to indicate improved functional mobility with ADLs.    Baseline 10/20/2020 deferred to next session; 10/29/2020 FOTO 59%    Time 8    Period Weeks    Status Achieved      PT LONG TERM GOAL #3   Title Patient will report a worst pain of 3/10 on VAS in      right knee       to improve tolerance with ADLs and reduced symptoms with activities.     Baseline Pt reports 1/10 R knee pain in last week    Time 8    Period Weeks    Status Achieved      PT LONG TERM GOAL #4   Title Patient will complete >1000 feet on 6 min walk test without increase in knee pain to  improve community ambulator distance for functional tasks and return to PLOF.    Baseline 10/20/2020 pt completed 1085 ft with no reports of knee pain    Time 8    Period Weeks    Status Achieved      PT LONG TERM GOAL #5   Title Patient will increase 10 meter walk test to >1.41ms as to improve gait speed for better community ambulation and to reduce fall risk.    Baseline 10/20/2020 0.8 m/s    Time 8    Period Weeks    Status On-going                 Plan - 11/17/20 1728    Clinical Impression Statement As pt reported recent fall while walking her dog earlier tihs week, part of session focused on reactive postural control/balance. Pt with absent or delayed stepping strategy when performing reactive balance exercises in // bars, requiring BUE support on bar or assist from PT to maintain balance. With frequent cues she was able to take small to medium sized steps, but still with delay, indicating fall risk. Pt will benefit from further skilled therapy to improve BLE strength and balance in order to decrease fall risk and improve QOL.    Personal Factors and Comorbidities Age;Comorbidity 3+;Time since onset of injury/illness/exacerbation    Comorbidities HTN, OA, retinal tear in left eye with impaired vision, ringing in left ear, imbalance, A-fib, DMx2 controlled, insomnia,  obesity, left intraventricular meningioma- treated with gamma knife in Nov 2021    Examination-Activity Limitations Locomotion Level;Sleep;Squat;Stairs;Stand;Transfers    Examination-Participation Restrictions Cleaning;Community Activity;Shop;Volunteer;Yard Work    [employer]chant [employer] officer Evolving/Moderate complexity    Rehab Potential Good    PT Frequency 2x / week    PT Duration 8 weeks    PT Treatment/Interventions ADLs/Self Care Home Management;Cryotherapy;Moist Heat;Gait training;Stair training;Functional mobility training;Therapeutic activities;Therapeutic exercise;Balance training;Neuromuscular  re-education;Patient/family education;Orthotic Fit/Training;Manual techniques;Passive range of motion;Dry needling;Energy conservation;Taping    PT Next Visit Plan continue manual therapy and strengthening, weight-shifting exercises forward/backward/lateral, progress around-the-world and visual tracking balance exercises, reactive balance exercises    PT Home Exercise Plan Provide handout of updated HEP next session    Consulted and Agree with Plan of Care Patient           Patient will benefit from skilled therapeutic intervention in order to improve the following deficits and impairments:  Abnormal gait,Decreased balance,Decreased endurance,Decreased mobility,Difficulty walking,Obesity,Decreased range of motion,Decreased activity tolerance,Decreased strength,Pain  Visit Diagnosis: Muscle weakness (generalized)  Unsteadiness on feet  Chronic pain of right knee  Difficulty in walking, not elsewhere classified     Problem List Patient Active Problem List   Diagnosis Date Noted  . Right knee pain 07/10/2020  . Hoarseness of voice 04/21/2020  . Overactive bladder 10/09/2019  . Tinnitus, bilateral 11/28/2017  . Meningioma ([place]) 09/21/2017  . PAF (paroxysmal atrial fibrillation) ([place]) 02/03/2017  . Preventative health care 06/02/2016  . Advance directive discussed with patient 06/02/2016  . Hypertension   . Type 2 diabetes, controlled, with peripheral neuropathy ([place])   . Generalized osteoarthritis of multiple sites   . GERD (gastroesophageal reflux disease)   . Hyperthyroidism   . Retinal hole of right eye 06/26/2014  . Amblyopia, right 05/08/2014  . Status post intraocular lens implant 05/08/2014   Ricard Dillon PT, DPT 11/17/2020, 5:37 PM  [place] MAIN [facility] SERVICES [address] [place], [place], [address] Phone: [phone]   Fax:  [phone]  Name: Angela Bentley MRN: [id number] Date of Birth: 1941-06-19

## 2020-11-19 ENCOUNTER — Ambulatory Visit: Payer: Medicare Other

## 2020-11-19 ENCOUNTER — Telehealth: Payer: Self-pay

## 2020-11-19 NOTE — Telephone Encounter (Signed)
BV still Pending for SynviscOne, right knee.  Message has been sent through the portal for an update.

## 2020-11-24 ENCOUNTER — Telehealth: Payer: Self-pay

## 2020-11-24 NOTE — Telephone Encounter (Signed)
Called and left a VM advising patient that gel injection has been approved.  Approved for SynviscOne, right knee. Buy & Bill Covered at 100% through her secondary insurance Nurse, mental health) after [employer] pays. No Co-pay No PA required  Appt. 11/26/2020 with Dr. Marlou Sa

## 2020-11-25 ENCOUNTER — Other Ambulatory Visit: Payer: Self-pay

## 2020-11-25 ENCOUNTER — Ambulatory Visit: Payer: Medicare Other | Attending: Family Medicine

## 2020-11-25 VITALS — BP 140/62 | HR 79

## 2020-11-25 DIAGNOSIS — G8929 Other chronic pain: Secondary | ICD-10-CM | POA: Diagnosis not present

## 2020-11-25 DIAGNOSIS — M6281 Muscle weakness (generalized): Secondary | ICD-10-CM | POA: Insufficient documentation

## 2020-11-25 DIAGNOSIS — R2681 Unsteadiness on feet: Secondary | ICD-10-CM

## 2020-11-25 DIAGNOSIS — R262 Difficulty in walking, not elsewhere classified: Secondary | ICD-10-CM | POA: Insufficient documentation

## 2020-11-25 DIAGNOSIS — M25561 Pain in right knee: Secondary | ICD-10-CM | POA: Insufficient documentation

## 2020-11-25 NOTE — Therapy (Signed)
[place] MAIN [facility] SERVICES [address] [place], [place], [address] Phone: [phone]   Fax:  [phone]  Physical Therapy Treatment  Patient Details  Name: Tanecia Mccay MRN: [id number] Date of Birth: May 04, 1941 Referring Provider (PT): Loura Pardon, MD   Encounter Date: 11/25/2020   PT End of Session - 11/25/20 1112    Visit Number 18    Number of Visits 33    Date for PT Re-Evaluation 12/15/20    PT Start Time 1740    PT Stop Time 1104    PT Time Calculation (min) 50 min    Equipment Utilized During Treatment Gait belt    Activity Tolerance Patient tolerated treatment well    Behavior During Therapy [facility] for tasks assessed/performed           Past Medical History:  Diagnosis Date  . Actinic keratosis 06/25/2020   L pretibia distal - bx proven   . Balance problems   . Brain tumor ([place])    a. Left intraventricular tumor ->stable by 06/2017 MRI. Followed @ [facility].  Marland Kitchen Dysplastic nevus 06/25/2020   R flank - moderate  . Gait disturbance    a. Unsteady on feet/balance difficulty.  . Generalized osteoarthritis of multiple sites   . GERD (gastroesophageal reflux disease)   . History of DVT (deep vein thrombosis) 2016   estrogen and airflights. Rx with xarelto for 3 months  . History of stress test    a. 11/2016 ETT: No ST/T changes.  Performed to assess PAC/PVC burden with activity ->No ectopy noted.  . Hypertension   . Hyperthyroidism    treated briefly in college  . Insomnia   . Obesity   . PAF (paroxysmal atrial fibrillation) ([place])    a. 12/2016 Event Monitor: brief run of PAF-->CHA2DS2VASc = 5--> Xarelto.  . Plantar fasciitis   . Retinal tear of left eye   . Rosacea   . Squamous cell carcinoma of skin 09/07/2017   R dorsum of hand   . Type 2 diabetes, controlled, with peripheral neuropathy ([place])    a. 11/2016 A1c 7.4.  . Uterine fibroid     Past Surgical History:  Procedure Laterality Date  . CARPAL TUNNEL RELEASE  Right    Dr Burney Gauze  . CATARACT EXTRACTION    . 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 RETINAL TEAR REPAIR CRYOTHERAPY     10/09/13, then again 3/15    Vitals:   11/25/20 1116  BP: 140/62  Pulse: 79     Subjective Assessment - 11/25/20 1012    Subjective Pt reports she is having hyaluronic acid shot in R knee tomorrow and so she has cancelled next two upcoming appointments. Pt reports she had pain when she woke up this morning in R knee. No pain currently.    Pertinent History 80 yo Female reports recent new onset of LLE hip pain which started mid October 2021. As a result she is having increased RLE knee pain. She did get X-rays of left hip which shows no arthritis or acute injury. She reports since the hip started hurting it has actually gotten better, but now the right knee is bothering her more and she is having more of a balance issue. She had a gamma knife procedure for Meningioma on 08/14/20; She reports since having the procedure she has noticed shuffling steps and unsteady gait. She has been using tripod base cane due to hip/knee pain and has  continued using it for balance/stability; She states side effects from gamma knife procedure is possible brain swelling from 6 months to 3 years after procedure. She reports occasional headaches; She is still having ringing in the ears on left side which is chronic. She also denies any change in vision;    How long can you sit comfortably? NA    How long can you stand comfortably? will having some pain with initial standing, but then as she moves around its okay    How long can you walk comfortably? NA    Diagnostic tests Denies any imaging of right knee; MRI of brain shows: There is an enlarging intraventricular tumor of the left lateral ventricle inseparable from cord plexus and inseparable from the left occipital lobe and other portions of the ependyma with possible invasion of the left parietal  lobe and left occipital lobe. There are heterogeneous areas of enhancement of the tumor and different lobules. Differential includes primarily an intraventricular meningioma. However with the enlargement consideration is also given for a tumor of the choroid plexus such as choroid plexus papilloma although less commonly seen in the lateral ventricles in adult patients, and other tumors. The growth appears slow for a cord plexus carcinoma.    Patient Stated Goals "What is going on with the knee, see if I need surgery"    Currently in Pain? No/denies    Pain Onset 1 to 4 weeks ago          Treatment  Neuromuscular Re-Education:  In // bar:  Standing EC x 30 sec, CGA Standing on foam EO x 30 sec, CGA Standing on foam EC x30 sec, CGA Standing on foam, feet together x 60 sec SLB - x 60 sec LLE, attempted x30 sec on R, but discontinued d/t reports of increased pain; CGA-min assist, with UUE support to intermittent UE support Cone tapping - x multiple reps each LE, intermittent UE support, CGA-min assist Airex beam - lateral stepping x multiple rounds R and L, CGA-min assist, pt required intermittent UE support. Airex beam cross-over stepping attempted, but reports of R knee pain so discontinued Airex beam tandem walking -  X multiple rounds, CGA, with intermittent UE support Side stepping over two hurdles, R and L, x multiple rounds, pt difficulty fully clearing feet. CGA provided. Pt with occasional intermittent UE support.  Therapeutic Exercise:   Hamstring stretching 2x30 sec B LEs    LAQs 3# AW 3x15 pt rates exercise as easy-medium  Seated marches 3# AW 2x20   PT Education - 11/25/20 1112    Education Details Exercise technique with side-steping on airex beam    Person(s) Educated Patient    Methods Explanation;Demonstration;Verbal cues    Comprehension Verbalized understanding;Returned demonstration            PT Short Term Goals - 10/20/20 1738      PT SHORT TERM GOAL #1    Title Patient will be adherent to HEP at least 3x a week to improve functional strength and balance for better safety at home.    Baseline 10/20/2020 Pt reports she has not done HEP in past 2 weeks    Time 4    Period Weeks    Status On-going    Target Date 11/17/20      PT SHORT TERM GOAL #2   Title Patient will increase RLE knee AROM extension to within 5 degrees of neutral for better standing and walking tolerance;     Baseline 10/20/2020 partially met  pt 5 degrees from neutral    Time 4    Period Weeks    Status Partially Met    Target Date 11/17/20      PT SHORT TERM GOAL #3   Title Patient will reports maximum of 1 sleep disturbances per night over last 3 days to exhibit improved sleeping tolerance with less knee pain and reduced fatigue.     Baseline 10/20/2020 Pt reports sleep has been disturbed due to pain for past couple nights    Time 4    Period Weeks    Status On-going    Target Date 11/17/20             PT Long Term Goals - 10/29/20 1644      PT LONG TERM GOAL #1   Title Patient will increase BLE gross strength to 4+/5 as to improve functional strength for independent gait, increased standing tolerance and increased ADL ability.    Baseline 10/20/2020 MMT: 5/5 hip abduction/adduction, hip flexion 4+/5 B, Knee flexion R 5/5, L 4+/5, knee extension 5/5 B;  ankle PF/DF both  5/5 B, grossly 4+/5    Time 8    Period Weeks    Status Achieved      PT LONG TERM GOAL #2   Title Patient will improve FOTO score to >55/100 to indicate improved functional mobility with ADLs.    Baseline 10/20/2020 deferred to next session; 10/29/2020 FOTO 59%    Time 8    Period Weeks    Status Achieved      PT LONG TERM GOAL #3   Title Patient will report a worst pain of 3/10 on VAS in      right knee       to improve tolerance with ADLs and reduced symptoms with activities.     Baseline Pt reports 1/10 R knee pain in last week    Time 8    Period Weeks    Status Achieved      PT LONG  TERM GOAL #4   Title Patient will complete >1000 feet on 6 min walk test without increase in knee pain to improve community ambulator distance for functional tasks and return to PLOF.    Baseline 10/20/2020 pt completed 1085 ft with no reports of knee pain    Time 8    Period Weeks    Status Achieved      PT LONG TERM GOAL #5   Title Patient will increase 10 meter walk test to >1.20ms as to improve gait speed for better community ambulation and to reduce fall risk.    Baseline 10/20/2020 0.8 m/s    Time 8    Period Weeks    Status On-going            Plan - 11/25/20 1113    Clinical Impression Statement Pt was slightly limited this session due to increased discomfort and mild pain of R knee with attempting SLB on R and cross-over stepping on foam. Pt reported improvement but not complete resolution of sx once discontinuing those exercises. Pt with difficulty with static balance tasks. Pt will benefit from further skilled therapy to improve balance in order to decrease fall risk.    Personal Factors and Comorbidities Age;Comorbidity 3+;Time since onset of injury/illness/exacerbation    Comorbidities HTN, OA, retinal tear in left eye with impaired vision, ringing in left ear, imbalance, A-fib, DMx2 controlled, insomnia, obesity, left intraventricular meningioma- treated with gamma knife in Nov 2021  Examination-Activity Limitations Locomotion Level;Sleep;Squat;Stairs;Stand;Transfers    Examination-Participation Restrictions Cleaning;Community Activity;Shop;Volunteer;Yard Work    Merchant [employer] officer Evolving/Moderate complexity    Rehab Potential Good    PT Frequency 2x / week    PT Duration 8 weeks    PT Treatment/Interventions ADLs/Self Care Home Management;Cryotherapy;Moist Heat;Gait training;Stair training;Functional mobility training;Therapeutic activities;Therapeutic exercise;Balance training;Neuromuscular re-education;Patient/family education;Orthotic  Fit/Training;Manual techniques;Passive range of motion;Dry needling;Energy conservation;Taping    PT Next Visit Plan continue manual therapy and strengthening, weight-shifting exercises forward/backward/lateral, progress around-the-world and visual tracking balance exercises, reactive balance exercises, advance cone-taps.    PT Home Exercise Plan Provide handout of updated HEP next session    Consulted and Agree with Plan of Care Patient           Patient will benefit from skilled therapeutic intervention in order to improve the following deficits and impairments:  Abnormal gait,Decreased balance,Decreased endurance,Decreased mobility,Difficulty walking,Obesity,Decreased range of motion,Decreased activity tolerance,Decreased strength,Pain  Visit Diagnosis: Unsteadiness on feet  Muscle weakness (generalized)  Chronic pain of right knee  Difficulty in walking, not elsewhere classified     Problem List Patient Active Problem List   Diagnosis Date Noted  . Right knee pain 07/10/2020  . Hoarseness of voice 04/21/2020  . Overactive bladder 10/09/2019  . Tinnitus, bilateral 11/28/2017  . Meningioma ([place]) 09/21/2017  . PAF (paroxysmal atrial fibrillation) ([place]) 02/03/2017  . Preventative health care 06/02/2016  . Advance directive discussed with patient 06/02/2016  . Hypertension   . Type 2 diabetes, controlled, with peripheral neuropathy ([place])   . Generalized osteoarthritis of multiple sites   . GERD (gastroesophageal reflux disease)   . Hyperthyroidism   . Retinal hole of right eye 06/26/2014  . Amblyopia, right 05/08/2014  . Status post intraocular lens implant 05/08/2014   Ricard Dillon PT, DPT  Zollie Pee 11/25/2020, [place] MAIN [facility] SERVICES [address] [place], [place], [address] Phone: [phone]   Fax:  [phone]  Name: Loletta Harper MRN: [id number] Date of Birth: [birth year]

## 2020-11-26 ENCOUNTER — Encounter: Payer: Self-pay | Admitting: Orthopedic Surgery

## 2020-11-26 ENCOUNTER — Ambulatory Visit: Payer: Medicare Other

## 2020-11-26 ENCOUNTER — Ambulatory Visit (INDEPENDENT_AMBULATORY_CARE_PROVIDER_SITE_OTHER): Payer: Medicare Other | Admitting: Orthopedic Surgery

## 2020-11-26 ENCOUNTER — Other Ambulatory Visit: Payer: Self-pay

## 2020-11-26 DIAGNOSIS — M1711 Unilateral primary osteoarthritis, right knee: Secondary | ICD-10-CM

## 2020-11-26 MED ORDER — HYLAN G-F 20 48 MG/6ML IX SOSY
48.0000 mg | PREFILLED_SYRINGE | INTRA_ARTICULAR | Status: AC | PRN
  Administered 2020-11-26: 48 mg via INTRA_ARTICULAR

## 2020-11-26 MED ORDER — LIDOCAINE HCL 1 % IJ SOLN
5.0000 mL | INTRAMUSCULAR | Status: AC | PRN
  Administered 2020-11-26: 5 mL

## 2020-11-26 NOTE — Progress Notes (Signed)
   Procedure Note  Patient: Riah Kehoe             Date of Birth: [birth year]           MRN: [id number]             Visit Date: 11/26/2020  Procedures: Visit Diagnoses:  1. Arthritis of right knee     Large Joint Inj: R knee on 11/26/2020 9:46 AM Indications: pain, joint swelling and diagnostic evaluation Details: 18 G 1.5 in needle, superolateral approach  Arthrogram: No  Medications: 5 mL lidocaine 1 %; 48 mg Hylan 48 MG/6ML Outcome: tolerated well, no immediate complications Procedure, treatment alternatives, risks and benefits explained, specific risks discussed. Consent was given by the patient. Immediately prior to procedure a time out was called to verify the correct patient, procedure, equipment, support staff and site/side marked as required. Patient was prepped and draped in the usual sterile fashion.

## 2020-11-30 ENCOUNTER — Ambulatory Visit: Payer: Medicare Other | Admitting: Physical Therapy

## 2020-12-07 ENCOUNTER — Encounter: Payer: Self-pay | Admitting: Orthopedic Surgery

## 2020-12-07 NOTE — Telephone Encounter (Signed)
Rx written thx

## 2020-12-09 ENCOUNTER — Telehealth: Payer: Self-pay | Admitting: Orthopedic Surgery

## 2020-12-09 NOTE — Telephone Encounter (Signed)
FYI Patient called advised she could not read the message in mychart that was sent to her.  I read the message from [place] to the patient.

## 2020-12-17 ENCOUNTER — Ambulatory Visit (INDEPENDENT_AMBULATORY_CARE_PROVIDER_SITE_OTHER): Payer: Medicare Other | Admitting: Dermatology

## 2020-12-17 ENCOUNTER — Other Ambulatory Visit: Payer: Self-pay

## 2020-12-17 DIAGNOSIS — Z86018 Personal history of other benign neoplasm: Secondary | ICD-10-CM | POA: Diagnosis not present

## 2020-12-17 DIAGNOSIS — D1800 Hemangioma unspecified site: Secondary | ICD-10-CM | POA: Diagnosis not present

## 2020-12-17 DIAGNOSIS — L814 Other melanin hyperpigmentation: Secondary | ICD-10-CM | POA: Diagnosis not present

## 2020-12-17 DIAGNOSIS — Z85828 Personal history of other malignant neoplasm of skin: Secondary | ICD-10-CM

## 2020-12-17 DIAGNOSIS — Z1283 Encounter for screening for malignant neoplasm of skin: Secondary | ICD-10-CM | POA: Diagnosis not present

## 2020-12-17 DIAGNOSIS — D229 Melanocytic nevi, unspecified: Secondary | ICD-10-CM | POA: Diagnosis not present

## 2020-12-17 DIAGNOSIS — L821 Other seborrheic keratosis: Secondary | ICD-10-CM | POA: Diagnosis not present

## 2020-12-17 DIAGNOSIS — C44319 Basal cell carcinoma of skin of other parts of face: Secondary | ICD-10-CM

## 2020-12-17 DIAGNOSIS — L578 Other skin changes due to chronic exposure to nonionizing radiation: Secondary | ICD-10-CM

## 2020-12-17 DIAGNOSIS — L570 Actinic keratosis: Secondary | ICD-10-CM

## 2020-12-17 DIAGNOSIS — Z872 Personal history of diseases of the skin and subcutaneous tissue: Secondary | ICD-10-CM | POA: Diagnosis not present

## 2020-12-17 DIAGNOSIS — D485 Neoplasm of uncertain behavior of skin: Secondary | ICD-10-CM

## 2020-12-17 DIAGNOSIS — C4491 Basal cell carcinoma of skin, unspecified: Secondary | ICD-10-CM

## 2020-12-17 HISTORY — DX: Basal cell carcinoma of skin, unspecified: C44.91

## 2020-12-17 NOTE — Progress Notes (Signed)
Follow-Up Visit   Subjective  Angela Bentley is a 80 y.o. female who presents for the following: FBSE (Patient here for full body skin exam and skin cancer screening. Patient with hx of SCC, AK and dysplastic nevus. Patient not aware of anything new or changing. ).  The following portions of the chart were reviewed this encounter and updated as appropriate:   Tobacco  Allergies  Meds  Problems  Med Hx  Surg Hx  Fam Hx      Review of Systems:  No other skin or systemic complaints except as noted in HPI or Assessment and Plan.  Objective  Well appearing patient in no apparent distress; mood and affect are within normal limits.  A full examination was performed including scalp, head, eyes, ears, nose, lips, neck, chest, axillae, abdomen, back, buttocks, bilateral upper extremities, bilateral lower extremities, hands, feet, fingers, toes, fingernails, and toenails. All findings within normal limits unless otherwise noted below.  Objective  right cheek x 5, left forehead x 1, left cheek x 1, left hand x 1, left forearm x 1, right medial calf x 1, right hand x 3, right axilla x 1 (13): Erythematous thin papules/macules with gritty scale.   Objective  right temple: 0.8cm pink papule        Assessment & Plan  AK (actinic keratosis) (13) right cheek x 5, left forehead x 1, left cheek x 1, left hand x 1, left forearm x 1, right medial calf x 1, right hand x 3, right axilla x 1  Prior to procedure, discussed risks of blister formation, small wound, skin dyspigmentation, or rare scar following cryotherapy.   Hypertrophic at right axilla, consider bx if not resolved.   Destruction of lesion - right cheek x 5, left forehead x 1, left cheek x 1, left hand x 1, left forearm x 1, right medial calf x 1, right hand x 3, right axilla x 1  Destruction method: cryotherapy   Informed consent: discussed and consent obtained   Lesion destroyed using liquid nitrogen: Yes   Cryotherapy  cycles:  2 Outcome: patient tolerated procedure well with no complications   Post-procedure details: wound care instructions given    Neoplasm of uncertain behavior of skin right temple  Skin / nail biopsy Type of biopsy: tangential   Informed consent: discussed and consent obtained   Timeout: patient name, date of birth, surgical site, and procedure verified   Patient was prepped and draped in usual sterile fashion: Area prepped with isopropyl alcohol. Anesthesia: the lesion was anesthetized in a standard fashion   Anesthetic:  1% lidocaine w/ epinephrine 1-100,000 buffered w/ 8.4% NaHCO3 Instrument used: flexible razor blade   Hemostasis achieved with: aluminum chloride   Outcome: patient tolerated procedure well   Post-procedure details: wound care instructions given   Additional details:  Mupirocin and a bandage applied  Specimen 1 - Surgical pathology Differential Diagnosis: R/o BCC  Check Margins: No 0.8cm pink papule     Lentigines - Scattered tan macules - Due to sun exposure - Benign-appering, observe - Recommend daily broad spectrum sunscreen SPF 30+ to sun-exposed areas, reapply every 2 hours as needed. - Call for any changes  Seborrheic Keratoses - Stuck-on, waxy, tan-brown papules and plaques  - Discussed benign etiology and prognosis. - Observe - Call for any changes  Melanocytic Nevi - Tan-brown and/or pink-flesh-colored symmetric macules and papules - Benign appearing on exam today - Observation - Call clinic for new or changing moles - Recommend  daily use of broad spectrum spf 30+ sunscreen to sun-exposed areas.   Hemangiomas - Red papules - Discussed benign nature - Observe - Call for any changes  Actinic Damage - Chronic, severe, secondary to cumulative UV/sun exposure - may consider field therapy at follow-up - diffuse scaly erythematous macules with underlying dyspigmentation - Recommend daily broad spectrum sunscreen SPF 30+ to  sun-exposed areas, reapply every 2 hours as needed.  - Call for new or changing lesions.  Skin cancer screening performed today.  History of Dysplastic Nevi - No evidence of recurrence today at right flank - Recommend regular full body skin exams - Recommend daily broad spectrum sunscreen SPF 30+ to sun-exposed areas, reapply every 2 hours as needed.  - Call if any new or changing lesions are noted between office visits  History of PreCancerous Actinic Keratosis  - site(s) of PreCancerous Actinic Keratosis clear today at left pretibial distal. - these may recur and new lesions may form requiring treatment to prevent transformation into skin cancer - observe for new or changing spots and contact [place] for appointment if occur - photoprotection with sun protective clothing; sunglasses and broad spectrum sunscreen with SPF of at least 30 + and frequent self skin exams recommended - yearly exams by a dermatologist recommended for persons with history of PreCancerous Actinic Keratoses  History of Squamous Cell Carcinoma of the Skin - No evidence of recurrence today at right dorsal hand - No lymphadenopathy - Recommend regular full body skin exams - Recommend daily broad spectrum sunscreen SPF 30+ to sun-exposed areas, reapply every 2 hours as needed.  - Call if any new or changing lesions are noted between office visits  Return in about 2 months (around 02/16/2021) for AK follow up.  Graciella Belton, RMA, am acting as scribe for Forest Gleason, MD .  Documentation: I have reviewed the above documentation for accuracy and completeness, and I agree with the above.  Forest Gleason, MD

## 2020-12-17 NOTE — Patient Instructions (Addendum)
Melanoma ABCDEs  Melanoma is the most dangerous type of skin cancer, and is the leading cause of death from skin disease.  You are more likely to develop melanoma if you:  Have light-colored skin, light-colored eyes, or red or blond hair  Spend a lot of time in the sun  Tan regularly, either outdoors or in a tanning bed  Have had blistering sunburns, especially during childhood  Have a close family member who has had a melanoma  Have atypical moles or large birthmarks  Early detection of melanoma is key since treatment is typically straightforward and cure rates are extremely high if we catch it early.   The first sign of melanoma is often a change in a mole or a new dark spot.  The ABCDE system is a way of remembering the signs of melanoma.  A for asymmetry:  The two halves do not match. B for border:  The edges of the growth are irregular. C for color:  A mixture of colors are present instead of an even brown color. D for diameter:  Melanomas are usually (but not always) greater than 20mm - the size of a pencil eraser. E for evolution:  The spot keeps changing in size, shape, and color.  Please check your skin once per month between visits. You can use a small mirror in front and a large mirror behind you to keep an eye on the back side or your body.   If you see any new or changing lesions before your next follow-up, please call to schedule a visit.  Please continue daily skin protection including broad spectrum sunscreen SPF 30+ to sun-exposed areas, reapplying every 2 hours as needed when you're outdoors.    Cryotherapy Aftercare  . Wash gently with soap and water everyday.   Marland Kitchen Apply Vaseline and Band-Aid daily until healed.  Prior to procedure, discussed risks of blister formation, small wound, skin dyspigmentation, or rare scar following cryotherapy.    Wound Care Instructions  1. Cleanse wound gently with soap and water once a day then pat dry with clean gauze. Apply  a thing coat of Petrolatum (petroleum jelly, "Vaseline") over the wound (unless you have an allergy to this). We recommend that you use a new, sterile tube of Vaseline. Do not pick or remove scabs. Do not remove the yellow or white "healing tissue" from the base of the wound.  2. Cover the wound with fresh, clean, nonstick gauze and secure with paper tape. You may use Band-Aids in place of gauze and tape if the would is small enough, but would recommend trimming much of the tape off as there is often too much. Sometimes Band-Aids can irritate the skin.  3. You should call the office for your biopsy report after 1 week if you have not already been contacted.  4. If you experience any problems, such as abnormal amounts of bleeding, swelling, significant bruising, significant pain, or evidence of infection, please call the office immediately.

## 2020-12-18 DIAGNOSIS — M25561 Pain in right knee: Secondary | ICD-10-CM | POA: Diagnosis not present

## 2020-12-18 DIAGNOSIS — M6281 Muscle weakness (generalized): Secondary | ICD-10-CM | POA: Diagnosis not present

## 2020-12-18 DIAGNOSIS — R2689 Other abnormalities of gait and mobility: Secondary | ICD-10-CM | POA: Diagnosis not present

## 2020-12-20 ENCOUNTER — Encounter: Payer: Self-pay | Admitting: Dermatology

## 2020-12-21 DIAGNOSIS — M25561 Pain in right knee: Secondary | ICD-10-CM | POA: Diagnosis not present

## 2020-12-21 DIAGNOSIS — R2689 Other abnormalities of gait and mobility: Secondary | ICD-10-CM | POA: Diagnosis not present

## 2020-12-21 DIAGNOSIS — M6281 Muscle weakness (generalized): Secondary | ICD-10-CM | POA: Diagnosis not present

## 2020-12-22 DIAGNOSIS — M6281 Muscle weakness (generalized): Secondary | ICD-10-CM | POA: Diagnosis not present

## 2020-12-22 DIAGNOSIS — R2689 Other abnormalities of gait and mobility: Secondary | ICD-10-CM | POA: Diagnosis not present

## 2020-12-22 DIAGNOSIS — M25561 Pain in right knee: Secondary | ICD-10-CM | POA: Diagnosis not present

## 2020-12-23 ENCOUNTER — Telehealth: Payer: Self-pay | Admitting: Dermatology

## 2020-12-23 NOTE — Telephone Encounter (Signed)
Entered in error

## 2020-12-24 ENCOUNTER — Other Ambulatory Visit: Payer: Self-pay

## 2020-12-24 DIAGNOSIS — C44319 Basal cell carcinoma of skin of other parts of face: Secondary | ICD-10-CM

## 2020-12-24 NOTE — Progress Notes (Unsigned)
amb  

## 2020-12-25 DIAGNOSIS — M25561 Pain in right knee: Secondary | ICD-10-CM | POA: Diagnosis not present

## 2020-12-25 DIAGNOSIS — R2689 Other abnormalities of gait and mobility: Secondary | ICD-10-CM | POA: Diagnosis not present

## 2020-12-25 DIAGNOSIS — M6281 Muscle weakness (generalized): Secondary | ICD-10-CM | POA: Diagnosis not present

## 2020-12-28 DIAGNOSIS — M25561 Pain in right knee: Secondary | ICD-10-CM | POA: Diagnosis not present

## 2020-12-28 DIAGNOSIS — R2689 Other abnormalities of gait and mobility: Secondary | ICD-10-CM | POA: Diagnosis not present

## 2020-12-28 DIAGNOSIS — M6281 Muscle weakness (generalized): Secondary | ICD-10-CM | POA: Diagnosis not present

## 2020-12-30 DIAGNOSIS — R2689 Other abnormalities of gait and mobility: Secondary | ICD-10-CM | POA: Diagnosis not present

## 2020-12-30 DIAGNOSIS — M6281 Muscle weakness (generalized): Secondary | ICD-10-CM | POA: Diagnosis not present

## 2020-12-30 DIAGNOSIS — M25561 Pain in right knee: Secondary | ICD-10-CM | POA: Diagnosis not present

## 2021-01-01 DIAGNOSIS — R2689 Other abnormalities of gait and mobility: Secondary | ICD-10-CM | POA: Diagnosis not present

## 2021-01-01 DIAGNOSIS — M6281 Muscle weakness (generalized): Secondary | ICD-10-CM | POA: Diagnosis not present

## 2021-01-01 DIAGNOSIS — M25561 Pain in right knee: Secondary | ICD-10-CM | POA: Diagnosis not present

## 2021-01-05 NOTE — Telephone Encounter (Signed)
Yes, if she has not had black stools prior to these nosebleeds.

## 2021-01-05 NOTE — Telephone Encounter (Signed)
Is it possible that the stool is black from the blood she has swallowed in the last 2 days?   Sending to [facility] since Dr [name] is not here this afternoon.

## 2021-01-05 NOTE — Telephone Encounter (Signed)
Spoke to pt. Advised her that Rollene Fare agreed that was a possibility with her stools. She is going to wait another day or 2 to see if they keep happening. BP has been normal. She will call by Thursday morning if not any better.

## 2021-01-06 DIAGNOSIS — M6281 Muscle weakness (generalized): Secondary | ICD-10-CM | POA: Diagnosis not present

## 2021-01-06 DIAGNOSIS — R2689 Other abnormalities of gait and mobility: Secondary | ICD-10-CM | POA: Diagnosis not present

## 2021-01-06 DIAGNOSIS — M25561 Pain in right knee: Secondary | ICD-10-CM | POA: Diagnosis not present

## 2021-01-07 ENCOUNTER — Other Ambulatory Visit: Payer: Self-pay

## 2021-01-07 ENCOUNTER — Ambulatory Visit (INDEPENDENT_AMBULATORY_CARE_PROVIDER_SITE_OTHER): Payer: Medicare Other | Admitting: Family Medicine

## 2021-01-07 VITALS — BP 122/68 | HR 83 | Temp 97.5°F | Wt 200.5 lb

## 2021-01-07 DIAGNOSIS — R040 Epistaxis: Secondary | ICD-10-CM | POA: Diagnosis not present

## 2021-01-07 DIAGNOSIS — Z7901 Long term (current) use of anticoagulants: Secondary | ICD-10-CM | POA: Diagnosis not present

## 2021-01-07 DIAGNOSIS — I480 Paroxysmal atrial fibrillation: Secondary | ICD-10-CM

## 2021-01-07 DIAGNOSIS — R058 Other specified cough: Secondary | ICD-10-CM | POA: Insufficient documentation

## 2021-01-07 MED ORDER — HYDROCODONE-HOMATROPINE 5-1.5 MG/5ML PO SYRP
5.0000 mL | ORAL_SOLUTION | Freq: Every evening | ORAL | 0 refills | Status: DC | PRN
Start: 2021-01-07 — End: 2021-02-03

## 2021-01-07 NOTE — Assessment & Plan Note (Signed)
Pt notes nighttime cough with allergies and responds to hycodan. Last fill 08/2020 and 02/2020 reasonable to refill. Use with caution

## 2021-01-07 NOTE — Assessment & Plan Note (Signed)
Discussed need for Xarelto 2/2 to afib. Given she has not had recurrent epitaxis ok to continue medication and watch and wait.

## 2021-01-07 NOTE — Telephone Encounter (Signed)
Spoke to pt. Made appt with Dr Einar Pheasant today at 10:40

## 2021-01-07 NOTE — Progress Notes (Signed)
Subjective:     Angela Bentley is a 80 y.o. female presenting for Epistaxis (2 times on Monday. Severe and swallowed a lot. )     HPI  #Nosebleeds - takes xarelto - Monday morning was blowing her nose and had a severe nose bleed - it was running in the front and down the back of her throat - did have a black BM - used [facility] EMT support - it stopped on its own - he talked about leaning forward and pinching the nose - then later that day had another nose bleed - learned how to do compression to stop the bleeding - seemed to stop on its own - notes some lightheadedness since then    Has allergies - using humidifer in the bedroom, has not noticed a lot of coughing, sneezing, hoarse voice, has not noticed dry nasal passages   Review of Systems   Social History   Tobacco Use  Smoking Status Former Smoker  . Quit date: 09/26/1993  . Years since quitting: 27.3  Smokeless Tobacco Never Used  Tobacco Comment   quit in 1996        Objective:    BP Readings from Last 3 Encounters:  01/07/21 122/68  11/25/20 140/62  10/14/20 140/74   Wt Readings from Last 3 Encounters:  01/07/21 200 lb 8 oz (90.9 kg)  10/14/20 200 lb (90.7 kg)  07/10/20 201 lb 5 oz (91.3 kg)    BP 122/68   Pulse 83   Temp (!) 97.5 F (36.4 C) (Temporal)   Wt 200 lb 8 oz (90.9 kg)   LMP 09/26/1997   SpO2 97%   BMI 32.86 kg/m    Physical Exam Constitutional:      General: She is not in acute distress.    Appearance: She is well-developed. She is not diaphoretic.  HENT:     Right Ear: External ear normal.     Left Ear: External ear normal.     Nose: Mucosal edema and congestion present. No nasal deformity or rhinorrhea.     Left Turbinates: Swollen.  Eyes:     Conjunctiva/sclera: Conjunctivae normal.  Cardiovascular:     Rate and Rhythm: Normal rate and regular rhythm.  Pulmonary:     Effort: Pulmonary effort is normal.     Breath sounds: Normal breath sounds.  Musculoskeletal:      Cervical back: Neck supple.  Skin:    General: Skin is warm and dry.     Capillary Refill: Capillary refill takes less than 2 seconds.  Neurological:     Mental Status: She is alert. Mental status is at baseline.  Psychiatric:        Mood and Affect: Mood normal.        Behavior: Behavior normal.           Assessment & Plan:   Problem List Items Addressed This Visit      Cardiovascular and Mediastinum   PAF (paroxysmal atrial fibrillation) (HCC) - Primary    Regular on metoprolol. Cont xarelto 20 mg.         Other   Chronic anticoagulation    Discussed need for Xarelto 2/2 to afib. Given she has not had recurrent epitaxis ok to continue medication and watch and wait.       Allergic cough    Pt notes nighttime cough with allergies and responds to hycodan. Last fill 08/2020 and 02/2020 reasonable to refill. Use with caution  Other Visit Diagnoses    Epistaxis         Epistaxis has resolved. Given one episode recommend watch and wait. If another episode would recommend ENT referral. Discussed possible allergy and consider saline nasal spray to help.    Return if symptoms worsen or fail to improve.  Angela Noe, MD  This visit occurred during the SARS-CoV-2 public health emergency.  Safety protocols were in place, including screening questions prior to the visit, additional usage of staff PPE, and extensive cleaning of exam room while observing appropriate contact time as indicated for disinfecting solutions.

## 2021-01-07 NOTE — Assessment & Plan Note (Signed)
Regular on metoprolol. Cont xarelto 20 mg.

## 2021-01-07 NOTE — Telephone Encounter (Addendum)
Per note below pt already spoke with [facility] CMA this morning and pt has appt with Dr Einar Pheasant today at 10:40.

## 2021-01-07 NOTE — Patient Instructions (Signed)
Try saline spray once per day  If you get another nose bleed -- call and will place referral to Ear Nose and throat

## 2021-01-07 NOTE — Telephone Encounter (Signed)
[place] Night - Client TELEPHONE ADVICE RECORD [facility] Patient Name: Angela Bentley Angela Bentley Gender: Female DOB: November 24, 1940 Age: 80 Y 44 M 29 D Return Phone Number: [phone] (Primary) Address: City/ State/ Zip: [place] [address] Client [place] Night - Client Client Site [place] Physician Viviana Simpler- MD Contact Type Call Who Is Calling Patient / Member / Family / Caregiver Call Type Triage / Clinical Relationship To Patient Self Return Phone Number [phone] (Primary) Chief Complaint Health information question (non symptomatic) Reason for Call Request to Schedule Office Appointment Initial Comment Caller states that her physical therapist told her to be seen tomorrow. She had 2 violent nosebleeds 2 days ago, no current symptoms Translation No No Triage Reason Other Nurse Assessment Nurse: Ardyth Harps, RN, Caryn Date/Time (Eastern Time): 01/06/2021 5:41:21 PM Confirm Angela document reason for call. If symptomatic, describe symptoms. ---Caller states she had a couple of violent nosebleeds on Monday. Lasted about an hour Angela a half, both times. The Physical Therapist informed her that she should be seen tomorrow. She is also experiencing some black tarry stools that she feels is from swallowing the blood. She is on Xaralto but never experienced symptoms like this before. Does the patient have any new or worsening symptoms? ---Yes Will a triage be completed? ---No Select reason for no triage. ---Other Disp. Time Eilene Ghazi Time) Disposition Final User 01/06/2021 5:49:00 PM Clinical Call Yes Ardyth Harps, RN, Caryn Comments User: Ulis Rias, RN Date/Time Eilene Ghazi Time): 01/06/2021 5:45:49 PM Had brain surgery "[facility]Knife" in November User: Ulis Rias, RN Date/Time Eilene Ghazi Time): 01/06/2021 5:46:35 PM PLEASE NOTE: All timestamps contained within this report are  represented as [place] Standard Time. CONFIDENTIALTY NOTICE: This fax transmission is intended only for the addressee. It contains information that is legally privileged, confidential or otherwise protected from use or disclosure. If you are not the intended recipient, you are strictly prohibited from reviewing, disclosing, copying using or disseminating any of this information or taking any action in reliance on or regarding this information. If you have received this fax in error, please notify us immediately by telephone so that we can arrange for its return to [place]. Phone: [phone], Toll-Free: [phone], Fax: [phone] Page: 2 of 2 Call Id: [id number] Comments Pt will call Angela try to schedule an appt in the morning

## 2021-01-08 DIAGNOSIS — M6281 Muscle weakness (generalized): Secondary | ICD-10-CM | POA: Diagnosis not present

## 2021-01-08 DIAGNOSIS — M25561 Pain in right knee: Secondary | ICD-10-CM | POA: Diagnosis not present

## 2021-01-08 DIAGNOSIS — R2689 Other abnormalities of gait and mobility: Secondary | ICD-10-CM | POA: Diagnosis not present

## 2021-01-11 DIAGNOSIS — M25561 Pain in right knee: Secondary | ICD-10-CM | POA: Diagnosis not present

## 2021-01-11 DIAGNOSIS — M6281 Muscle weakness (generalized): Secondary | ICD-10-CM | POA: Diagnosis not present

## 2021-01-11 DIAGNOSIS — R2689 Other abnormalities of gait and mobility: Secondary | ICD-10-CM | POA: Diagnosis not present

## 2021-01-13 DIAGNOSIS — R2689 Other abnormalities of gait and mobility: Secondary | ICD-10-CM | POA: Diagnosis not present

## 2021-01-13 DIAGNOSIS — M25561 Pain in right knee: Secondary | ICD-10-CM | POA: Diagnosis not present

## 2021-01-13 DIAGNOSIS — M6281 Muscle weakness (generalized): Secondary | ICD-10-CM | POA: Diagnosis not present

## 2021-01-15 DIAGNOSIS — M25561 Pain in right knee: Secondary | ICD-10-CM | POA: Diagnosis not present

## 2021-01-15 DIAGNOSIS — M6281 Muscle weakness (generalized): Secondary | ICD-10-CM | POA: Diagnosis not present

## 2021-01-15 DIAGNOSIS — R2689 Other abnormalities of gait and mobility: Secondary | ICD-10-CM | POA: Diagnosis not present

## 2021-01-18 DIAGNOSIS — M6281 Muscle weakness (generalized): Secondary | ICD-10-CM | POA: Diagnosis not present

## 2021-01-18 DIAGNOSIS — M25561 Pain in right knee: Secondary | ICD-10-CM | POA: Diagnosis not present

## 2021-01-18 DIAGNOSIS — R2689 Other abnormalities of gait and mobility: Secondary | ICD-10-CM | POA: Diagnosis not present

## 2021-01-19 DIAGNOSIS — C44319 Basal cell carcinoma of skin of other parts of face: Secondary | ICD-10-CM | POA: Diagnosis not present

## 2021-01-20 ENCOUNTER — Encounter: Payer: Self-pay | Admitting: Orthopedic Surgery

## 2021-01-21 NOTE — Telephone Encounter (Signed)
Cortisone 3 months

## 2021-01-25 ENCOUNTER — Telehealth: Payer: Self-pay | Admitting: Orthopedic Surgery

## 2021-01-25 DIAGNOSIS — M6281 Muscle weakness (generalized): Secondary | ICD-10-CM | POA: Diagnosis not present

## 2021-01-25 DIAGNOSIS — M25561 Pain in right knee: Secondary | ICD-10-CM | POA: Diagnosis not present

## 2021-01-25 DIAGNOSIS — R2689 Other abnormalities of gait and mobility: Secondary | ICD-10-CM | POA: Diagnosis not present

## 2021-01-25 NOTE — Telephone Encounter (Signed)
Pt called stating she wants to get another cortisone injection for her R knee but she wants to know if it would be ok to have her regular Dr. In [place] do it? Pt would like a CB   [phone]

## 2021-01-25 NOTE — Telephone Encounter (Signed)
Please advise 

## 2021-01-25 NOTE — Telephone Encounter (Signed)
sure

## 2021-01-26 NOTE — Telephone Encounter (Signed)
Lvm informing 

## 2021-01-27 ENCOUNTER — Telehealth: Payer: Self-pay

## 2021-01-27 DIAGNOSIS — R2689 Other abnormalities of gait and mobility: Secondary | ICD-10-CM | POA: Diagnosis not present

## 2021-01-27 DIAGNOSIS — M6281 Muscle weakness (generalized): Secondary | ICD-10-CM | POA: Diagnosis not present

## 2021-01-27 DIAGNOSIS — M25561 Pain in right knee: Secondary | ICD-10-CM | POA: Diagnosis not present

## 2021-01-27 NOTE — Chronic Care Management (AMB) (Addendum)
Chronic Care Management Pharmacy Assistant   Name: Angela Bentley  MRN: [id number] DOB: Aug 10, 1941  Reason for Encounter: Disease State- Hypertension and Diabetes  Recent office visits:  01/07/21- Dr. Waunita Schooner- Family practice- Refilled Hycodan for night time cough. Continue Xarelto. 10/14/20- Dr. Viviana Simpler- PCP- Ordered CMP, A1C, Lipid Panel, CBC, T4, free. No changes. Follow up in 6 months.  Recent consult visits:  12/17/20- Dr. Alfonso Patten- Dermatology- No medications changes. Follow up in 2 months.  12/07/20- Dr. Marcene Duos- Orthopedics- My Chart message- PT for right knee ordered 11/26/20- Dr. Marcene Duos- Orthopedics- Synvisc injection- right knee. No medication changes. 11/11/20- Dr. Marcene Duos- Orthopedics- Telephone encounter- Order for Synvisc injection in right knee.  10/10/20- Dr. Marcene Duos- Orthopedics- Right knee cortisone injection.    Hospital visits:  None in previous 6 months  Medications: Outpatient Encounter Medications as of 01/27/2021  Medication Sig   diclofenac sodium (VOLTAREN) 1 % GEL Apply 4 g topically 4 (four) times daily. (Patient taking differently: Apply 4 g topically 4 (four) times daily as needed.)   fluticasone (VERAMYST) 27.5 MCG/SPRAY nasal spray Place 2 sprays into the nose daily as needed for allergies.    glimepiride (AMARYL) 1 MG tablet Take 1 tablet (1 mg total) by mouth daily with breakfast.   HYDROcodone-homatropine (HYCODAN) 5-1.5 MG/5ML syrup Take 5 mLs by mouth at bedtime as needed for cough.   losartan-hydrochlorothiazide (HYZAAR) 100-12.5 MG tablet Take 1 tablet by mouth daily.   metoprolol succinate (TOPROL-XL) 25 MG 24 hr tablet Take 1 tablet (25 mg total) by mouth daily.   Multiple Vitamin (MULTIVITAMIN) tablet Take 1 tablet by mouth daily.   pioglitazone (ACTOS) 45 MG tablet Take 1 tablet (45 mg total) by mouth daily.   rivaroxaban (XARELTO) 20 MG TABS tablet TAKE 1 TABLET (20 MG TOTAL) BY MOUTH DAILY WITH SUPPER.    No facility-administered encounter medications on file as of 01/27/2021.    Recent Office Vitals: BP Readings from Last 3 Encounters:  01/07/21 122/68  11/25/20 140/62  10/14/20 140/74   Pulse Readings from Last 3 Encounters:  01/07/21 83  11/25/20 79  10/14/20 81    Wt Readings from Last 3 Encounters:  01/07/21 200 lb 8 oz (90.9 kg)  10/14/20 200 lb (90.7 kg)  07/10/20 201 lb 5 oz (91.3 kg)     Kidney Function Lab Results  Component Value Date/Time   CREATININE 0.82 10/14/2020 11:07 AM   CREATININE 0.79 10/09/2019 09:10 AM   GFR 67.20 10/14/2020 11:07 AM   GFRNONAA >60 03/24/2019 06:40 PM   GFRAA >60 03/24/2019 06:40 PM    BMP Latest Ref Rng & Units 10/14/2020 10/09/2019 03/24/2019  Glucose 70 - 99 mg/dL 183(H) 142(H) 144(H)  BUN 6 - 23 mg/dL 15 16 15   Creatinine 0.40 - 1.20 mg/dL 0.82 0.79 0.72  Sodium 135 - 145 mEq/L 140 138 139  Potassium 3.5 - 5.1 mEq/L 3.9 4.0 3.4(L)  Chloride 96 - 112 mEq/L 102 100 100  CO2 19 - 32 mEq/L 31 30 27   Calcium 8.4 - 10.5 mg/dL 9.6 9.7 9.3   Hypertension  Current antihypertensive regimen:  Metoprolol succinate 25 mg - 1 tablet daily Losartan/HCTZ 100-12.5 mg - 1 tablet daily   Patient verbally confirms she is taking the above medications as directed. Yes  How often are you checking your Blood Pressure? States she does not check her blood pressure and does not feel like she needs to because she is feeling good.  Any symptoms of hypertensive emergency? patient denies any symptoms of high blood pressure   What recent interventions/DTPs have been made by any provider to improve Blood Pressure control since last CPP Visit:  No recent changes or interventions  Any recent hospitalizations or ED visits since last visit with CPP? No  What diet changes have been made to improve Blood Pressure Control?  No changes to diet.  What exercise is being done to improve your Blood Pressure Control?  States she is doing PT but can not do  much more exercise than that due to knee pain.  Adherence Review: Is the patient currently on ACE/ARB medication? Yes Does the patient have >5 day gap between last estimated fill dates? No   Recent Relevant Labs: Lab Results  Component Value Date/Time   HGBA1C 7.6 (H) 10/14/2020 11:07 AM   HGBA1C 7.3 (A) 04/21/2020 08:44 AM   HGBA1C 7.6 (H) 10/09/2019 09:10 AM   HGBA1C 6.5 07/28/2015 12:00 AM    Kidney Function Lab Results  Component Value Date/Time   CREATININE 0.82 10/14/2020 11:07 AM   CREATININE 0.79 10/09/2019 09:10 AM   GFR 67.20 10/14/2020 11:07 AM   GFRNONAA >60 03/24/2019 06:40 PM   GFRAA >60 03/24/2019 06:40 PM   Diabetes  Current antihyperglycemic regimen:  Glimepiride 1 mg - 1 tablet daily Pioglitazone 45 mg - 1 tablet daily    Patient verbally confirms she is taking the above medications as directed. Yes  What recent interventions/DTPs have been made to improve glycemic control:  No recent interventions  Have there been any recent hospitalizations or ED visits since last visit with CPP? No  Patient denies hypoglycemic symptoms, including Pale, Sweaty, Shaky, Hungry, Nervous/irritable and Vision changes  Patient denies hyperglycemic symptoms, including blurry vision, excessive thirst, fatigue, polyuria and weakness  How often are you checking your blood sugar?  Patient states she feels good and does not feel the need to check her BG.  What are your blood sugars ranging? N/A  On insulin? No  During the week, how often does your blood glucose drop below 70? Unknown. Not checking BG levels.  Are you checking your feet daily/regularly? Yes- state feet look ok.  Adherence Review: Is the patient currently on a STATIN medication? No Is the patient currently on ACE/ARB medication? Yes Does the patient have >5 day gap between last estimated fill dates? Unable to assess - data not in chart  Star Rating Drugs:  Medication:   Last Fill: Day Supply Glimepiride  1 mg  01/19/21 No data avail Losartan-hctz 100-12.5 mg 01/19/21 No data avail Pioglitazone 45 mg  10/02/20  90  Patient states she is still having quite a bit of knee pain despite having cortisone injection in January and visco supplementation injection in March. She is seeing Dr. Lorelei Pont on 02/03/21 for another cortisone injection. Despite recent cortisone injections and the possibilities of increased blood sugars she has not checked recent levels.  Follow-Up:  Pharmacist Review  [name], CPP notified  Margaretmary Dys, [place] Assistant [phone]  I have reviewed the care management and care coordination activities outlined in this encounter and I am certifying that I agree with the content of this note. No further action required. BP stable, well controlled in clinic. A1c is adequate control. Okay to continue without routine monitoring of BG and BP unless symptomatic.  [name], PharmD Clinical Pharmacist [facility] [phone]

## 2021-02-01 DIAGNOSIS — R2689 Other abnormalities of gait and mobility: Secondary | ICD-10-CM | POA: Diagnosis not present

## 2021-02-01 DIAGNOSIS — M6281 Muscle weakness (generalized): Secondary | ICD-10-CM | POA: Diagnosis not present

## 2021-02-01 DIAGNOSIS — M25561 Pain in right knee: Secondary | ICD-10-CM | POA: Diagnosis not present

## 2021-02-03 ENCOUNTER — Ambulatory Visit (INDEPENDENT_AMBULATORY_CARE_PROVIDER_SITE_OTHER): Payer: Medicare Other | Admitting: Family Medicine

## 2021-02-03 ENCOUNTER — Other Ambulatory Visit: Payer: Self-pay

## 2021-02-03 ENCOUNTER — Encounter: Payer: Self-pay | Admitting: Family Medicine

## 2021-02-03 VITALS — BP 140/74 | HR 98 | Temp 97.8°F | Ht 65.5 in | Wt 201.2 lb

## 2021-02-03 DIAGNOSIS — M1711 Unilateral primary osteoarthritis, right knee: Secondary | ICD-10-CM | POA: Diagnosis not present

## 2021-02-03 MED ORDER — TRIAMCINOLONE ACETONIDE 40 MG/ML IJ SUSP
40.0000 mg | Freq: Once | INTRAMUSCULAR | Status: AC
  Administered 2021-02-03: 40 mg via INTRA_ARTICULAR

## 2021-02-03 NOTE — Progress Notes (Signed)
    [name], MD, [place] at [facility] [place], [address]  Phone: [phone]  FAX: [phone]  Angela Bentley - 80 y.o. female  MRN [id number]  Date of Birth: 1941/01/27  Date: 02/03/2021  PCP: [name], MD  Referral: [name], MD  Chief Complaint  Patient presents with  . Knee Pain    This visit occurred during the SARS-CoV-2 public health emergency.  Safety protocols were in place, including screening questions prior to the visit, additional usage of staff PPE, and extensive cleaning of exam room while observing appropriate contact time as indicated for disinfecting solutions.    She recently in /2022 had a visco injection by Dr. Marlou Sa.  Aspiration/Injection Procedure Note [name] [birth year] Date of procedure: 02/03/2021  Procedure: Large Joint Aspiration / Injection of Knee, R Indications: Pain  Procedure Details Patient verbally consented to procedure. Risks, benefits, and alternatives explained. Sterilely prepped with Chloraprep. Ethyl cholride used for anesthesia. 9 cc Lidocaine 1% mixed with 1 mL of Kenalog 40 mg injected using the anteromedial approach without difficulty. No complications with procedure and tolerated well. Patient had decreased pain post-injection. Medication: 1 mL of Kenalog 40 mg   Signed,  Frederich Montilla T. [name], MD

## 2021-02-03 NOTE — Addendum Note (Signed)
Addended by: Ophelia Shoulder on: 02/03/2021 08:41 AM   Modules accepted: Orders

## 2021-02-15 ENCOUNTER — Encounter: Payer: Self-pay | Admitting: Internal Medicine

## 2021-02-15 ENCOUNTER — Ambulatory Visit (INDEPENDENT_AMBULATORY_CARE_PROVIDER_SITE_OTHER): Payer: Medicare Other | Admitting: Internal Medicine

## 2021-02-15 ENCOUNTER — Other Ambulatory Visit: Payer: Self-pay

## 2021-02-15 DIAGNOSIS — E1142 Type 2 diabetes mellitus with diabetic polyneuropathy: Secondary | ICD-10-CM | POA: Diagnosis not present

## 2021-02-15 MED ORDER — GLIMEPIRIDE 2 MG PO TABS: 2.0000 mg | ORAL_TABLET | Freq: Every day | ORAL | 3 refills | Status: DC

## 2021-02-15 NOTE — Assessment & Plan Note (Signed)
Had worsening with the steroid injection Never overly bad The other symptoms may have been from the steroid--rather than the high sugars I will change the glimepiride to 2mg  Discussed SGLT2 inhibitors---could consider but would probably use it instead of the pioglitazone

## 2021-02-15 NOTE — Progress Notes (Signed)
Subjective:    Patient ID: Angela Bentley, female    DOB: 05/02/1941, 80 y.o.   MRN: [id number]  HPI Here due to elevated sugars after steroid injection With husband This visit occurred during the SARS-CoV-2 public health emergency.  Safety protocols were in place, including screening questions prior to the visit, additional usage of staff PPE, and extensive cleaning of exam room while observing appropriate contact time as indicated for disinfecting solutions.   Had steroid injection 5/12---right knee Sugars went up pretty quick--the next day Went up as high as 212 Increased the glimepiride to 2mg  daily 5 days ago Sugars now down below 160 again mostly She gets feeling of being flushed and slightly dizzy  Still getting some heart palpitations  No SOB or chest pain  Current Outpatient Medications on File Prior to Visit  Medication Sig Dispense Refill  . diclofenac Sodium (VOLTAREN) 1 % GEL Apply topically 4 (four) times daily as needed.    . fluticasone (VERAMYST) 27.5 MCG/SPRAY nasal spray Place 2 sprays into the nose daily as needed for allergies.     Marland Kitchen glimepiride (AMARYL) 1 MG tablet Take 1 tablet (1 mg total) by mouth daily with breakfast. 90 tablet 3  . HYDROcodone bit-homatropine (HYCODAN) 5-1.5 MG/5ML syrup Take 5 mLs by mouth at bedtime as needed for cough.    . losartan-hydrochlorothiazide (HYZAAR) 100-12.5 MG tablet Take 1 tablet by mouth daily. 90 tablet 3  . metoprolol succinate (TOPROL-XL) 25 MG 24 hr tablet Take 1 tablet (25 mg total) by mouth daily. 90 tablet 3  . Multiple Vitamin (MULTIVITAMIN) tablet Take 1 tablet by mouth daily.    . pioglitazone (ACTOS) 45 MG tablet Take 1 tablet (45 mg total) by mouth daily. 90 tablet 3  . rivaroxaban (XARELTO) 20 MG TABS tablet TAKE 1 TABLET (20 MG TOTAL) BY MOUTH DAILY WITH SUPPER. 90 tablet 3   No current facility-administered medications on file prior to visit.    Allergies  Allergen Reactions  . Penicillins     Has  patient had a PCN reaction causing immediate rash, facial/tongue/throat swelling, SOB or lightheadedness with hypotension: Yes Has patient had a PCN reaction causing severe rash involving mucus membranes or skin necrosis: Yes Has patient had a PCN reaction that required hospitalization No Has patient had a PCN reaction occurring within the last 10 years: No If all of the above answers are "NO", then may proceed with Cephalosporin use.    . Sulfa Antibiotics     Rash/itching  . Azithromycin Palpitations    Past Medical History:  Diagnosis Date  . Actinic keratosis 06/25/2020   L pretibia distal - bx proven   . Balance problems   . BCC (basal cell carcinoma of skin) 12/17/2020   right temple  . Brain tumor ([place])    a. Left intraventricular tumor ->stable by 06/2017 MRI. Followed @ [facility].  Marland Kitchen Dysplastic nevus 06/25/2020   R flank - moderate  . Gait disturbance    a. Unsteady on feet/balance difficulty.  . Generalized osteoarthritis of multiple sites   . GERD (gastroesophageal reflux disease)   . History of DVT (deep vein thrombosis) 2016   estrogen and airflights. Rx with xarelto for 3 months  . History of stress test    a. 11/2016 ETT: No ST/T changes.  Performed to assess PAC/PVC burden with activity ->No ectopy noted.  . Hypertension   . Hyperthyroidism    treated briefly in college  . Insomnia   . Obesity   .  PAF (paroxysmal atrial fibrillation) ([place])    a. 12/2016 Event Monitor: brief run of PAF-->CHA2DS2VASc = 5--> Xarelto.  . Plantar fasciitis   . Retinal tear of left eye   . Rosacea   . Squamous cell carcinoma of skin 09/07/2017   R dorsum of hand   . Type 2 diabetes, controlled, with peripheral neuropathy ([place])    a. 11/2016 A1c 7.4.  . Uterine fibroid     Past Surgical History:  Procedure Laterality Date  . CARPAL TUNNEL RELEASE Right    Dr Burney Gauze  . CATARACT EXTRACTION    . 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 RETINAL TEAR REPAIR CRYOTHERAPY     10/09/13, then again 3/15    Family History  Problem Relation Age of Onset  . Diabetes Father   . Pancreatic cancer Father   . Diabetes Other        grandmother    Social History   Socioeconomic History  . Marital status: Married    Spouse name: Not on file  . Number of children: 3  . Years of education: Not on file  . Highest education level: Not on file  Occupational History  . Occupation: Non Patent attorney    Comment: [employer] and others  Tobacco Use  . Smoking status: Former Smoker    Quit date: 09/26/1993    Years since quitting: 27.4  . Smokeless tobacco: Never Used  . Tobacco comment: quit in 1996  Vaping Use  . Vaping Use: Never used  Substance and Sexual Activity  . Alcohol use: Yes    Alcohol/week: 0.0 - 1.0 standard drinks  . Drug use: No  . Sexual activity: Not Currently    Partners: Male  Other Topics Concern  . Not on file  Social History Narrative   Has living will   Husband is health care POA-- or daughter Izora Gala   Would accept resuscitation attempts   Would not want tube feeds if cognitively unaware   Social Determinants of Health   Financial Resource Strain: Low Risk   . Difficulty of Paying Living Expenses: Not very hard  Food Insecurity: Not on file  Transportation Needs: Not on file  Physical Activity: Not on file  Stress: Not on file  Social Connections: Not on file  Intimate Partner Violence: Not on file   Review of Systems  Sleeping okay Appetite is fine--trying to limit carbs Has lost 6# through this Has felt some off balance    Objective:   Physical Exam Constitutional:      Appearance: Normal appearance.  Cardiovascular:     Rate and Rhythm: Normal rate and regular rhythm.     Heart sounds: No murmur heard. No gallop.   Pulmonary:     Effort: Pulmonary effort is normal.     Breath sounds: Normal breath sounds. No wheezing or rales.  Musculoskeletal:     Cervical back: Neck supple.      Right lower leg: No edema.     Left lower leg: No edema.  Lymphadenopathy:     Cervical: No cervical adenopathy.  Neurological:     Mental Status: She is alert.            Assessment & Plan:

## 2021-02-16 DIAGNOSIS — D329 Benign neoplasm of meninges, unspecified: Secondary | ICD-10-CM | POA: Diagnosis not present

## 2021-02-16 DIAGNOSIS — G9389 Other specified disorders of brain: Secondary | ICD-10-CM | POA: Diagnosis not present

## 2021-02-16 DIAGNOSIS — D320 Benign neoplasm of cerebral meninges: Secondary | ICD-10-CM | POA: Diagnosis not present

## 2021-02-17 ENCOUNTER — Other Ambulatory Visit: Payer: Self-pay

## 2021-02-17 ENCOUNTER — Ambulatory Visit: Payer: Medicare Other | Admitting: Dermatology

## 2021-02-24 DIAGNOSIS — H524 Presbyopia: Secondary | ICD-10-CM | POA: Diagnosis not present

## 2021-02-24 DIAGNOSIS — E119 Type 2 diabetes mellitus without complications: Secondary | ICD-10-CM | POA: Diagnosis not present

## 2021-02-24 DIAGNOSIS — H26492 Other secondary cataract, left eye: Secondary | ICD-10-CM | POA: Diagnosis not present

## 2021-02-24 DIAGNOSIS — H18593 Other hereditary corneal dystrophies, bilateral: Secondary | ICD-10-CM | POA: Diagnosis not present

## 2021-02-24 LAB — HM DIABETES EYE EXAM

## 2021-02-28 DIAGNOSIS — Z23 Encounter for immunization: Secondary | ICD-10-CM | POA: Diagnosis not present

## 2021-03-02 ENCOUNTER — Other Ambulatory Visit: Payer: Self-pay

## 2021-03-02 ENCOUNTER — Telehealth: Payer: Self-pay

## 2021-03-02 ENCOUNTER — Ambulatory Visit (INDEPENDENT_AMBULATORY_CARE_PROVIDER_SITE_OTHER): Payer: Medicare Other

## 2021-03-02 ENCOUNTER — Ambulatory Visit: Payer: Medicare Other | Admitting: Dermatology

## 2021-03-02 DIAGNOSIS — I10 Essential (primary) hypertension: Secondary | ICD-10-CM | POA: Diagnosis not present

## 2021-03-02 DIAGNOSIS — E1142 Type 2 diabetes mellitus with diabetic polyneuropathy: Secondary | ICD-10-CM

## 2021-03-02 NOTE — Telephone Encounter (Signed)
Dutch Quint B, FNP  You 24 minutes ago (2:58 PM)     Try over the counter Barry. If no better in 3-4 days, schedule an appointment to be seen.    Message text

## 2021-03-02 NOTE — Patient Instructions (Signed)
Dear Angela Bentley,  Below is a summary of the goals we discussed during our follow up appointment on March 02, 2021. Please contact me anytime with questions or concerns.   Visit Information  Patient Care Plan: CCM Pharmacy Care Plan    Problem Identified: CHL AMB "PATIENT-SPECIFIC PROBLEM"     Long-Range Goal: Disease Management   Start Date: 03/02/2021  Priority: High  Note:   Current Barriers:  . None identified  Pharmacist Clinical Goal(s):  Marland Kitchen Patient will contact provider office for questions/concerns as evidenced notation of same in electronic health record through collaboration with PharmD and provider.   Interventions: . 1:1 collaboration with [name], MD regarding development and update of comprehensive plan of care as evidenced by provider attestation and co-signature . Inter-disciplinary care team collaboration (see longitudinal plan of care) . Comprehensive medication review performed; medication list updated in electronic medical record  Hypertension (BP goal <140/90) -Controlled - per clinic readings within goal  -Current treatment:  Metoprolol succinate 25 mg - 1 tablet daily  Losartan/HCTZ 100-12.5 mg - 1 tablet daily  -Medications previously tried: none   -Current home readings: occasionally monitors home BP -Current dietary habits: follows low salt diet -Current exercise habits: PT, walks dog some  -Denies hypotensive/hypertensive symptoms; She has felt a little off balance since steroid injection. She is using a cane. No falls. She does not think this is related to low BP. -Educated on BP goals and benefits of medications for prevention of heart attack, stroke and kidney damage; -Counseled to monitor BP at home monthly, document, and provide log at future appointments. Call if BP < 90/60 mmHg. -Recommended to continue current medication  Diabetes (A1c goal <8%) -Controlled (A1c 7.6% 09/2020) -Current medications:  Glimepiride 2 mg - 1 tablet  daily (increased 02/10/21)  Pioglitazone 45 mg - 1 tablet daily  -Medications previously tried: metformin (diarrhea) -Current home glucose readings - has been monitoring more lately due to high after steroid injection 02/03/21, checking twice daily  . fasting glucose:  o 02/18/21 7 AM - 135  o 02/27/21 9:30 AM - 139 o 02/28/21 7:45 AM - 143 . post prandial glucose:  o 03/01/21 11:30 AM - 95 (after breakfast) o 02/28/21 9:45 AM - 146 (after breakfast) o 02/28/21 1 PM - 128 (after lunch) o 02/26/21 - 11:45 188 (after breakfast) -Denies hypoglycemic/hyperglycemic symptoms -Current meal patterns: she has changed diet, limiting carbs and sweets  . breakfast: eggs . lunch: salads . dinner: varies . snacks: diabetic cookies  -Current exercise: walking more lately -Educated on A1c and blood sugar goals; Fasting goal - 80-140, Post-prandial < 180. -Counseled to check feet daily and get yearly eye exams - these are due for 2022 -Recommended to continue current medication; Schedule annual eye and foot exam  Patient Goals/Self-Care Activities . Patient will:  - check glucose routinely until next visit with Dr. [name], document, and provide at future appointments check blood pressure at least once monthly and with symptoms of dizziness, document, and provide at future appointments  Follow Up Plan: Telephone follow up appointment with care management team member scheduled for: 6 months (BG and BP log review)      The patient verbalized understanding of instructions, educational materials, and care plan provided today and agreed to receive a mailed copy of patient instructions, educational materials, and care plan.   [name], PharmD Clinical Pharmacist [facility] [place] [phone]   Preventing Hypoglycemia Hypoglycemia occurs when the level of sugar (  glucose) in the blood is too low. Hypoglycemia can happen in people who do or do not have diabetes (diabetes mellitus). It can  develop quickly, and it can be a medical emergency. For most people with diabetes, a blood glucose level below 70 mg/dL (3.9 mmol/L) is considered hypoglycemia. Glucose is a type of sugar that provides the body's main source of energy. Certain hormones (insulin and glucagon) control the level of glucose in the blood. Insulin lowers blood glucose, and glucagon increases blood glucose. Hypoglycemia can result from having too much insulin in the bloodstream, or from not eating enough food that contains glucose. Your risk for hypoglycemia is higher:  If you take insulin or diabetes medicines to help lower your blood glucose or help your body make more insulin.  If you skip or delay a meal or snack.  If you are ill.  During and after exercise. You can prevent hypoglycemia by working with your health care provider to adjust your meal plan as needed and by taking other precautions. How can hypoglycemia affect me? Mild symptoms Mild hypoglycemia may not cause any symptoms. If you do have symptoms, they may include:  Hunger.  Anxiety.  Sweating and feeling clammy.  Dizziness or feeling light-headed.  Sleepiness.  Nausea.  Increased heart rate.  Headache.  Blurry vision.  Irritability.  Tingling or numbness around the mouth, lips, or tongue.  A change in coordination.  Restless sleep. If mild hypoglycemia is not recognized and treated, it can quickly become moderate or severe hypoglycemia. Moderate symptoms Moderate hypoglycemia can cause:  Mental confusion and poor judgment.  Behavior changes.  Weakness.  Irregular heartbeat. Severe symptoms Severe hypoglycemia is a medical emergency. It can cause:  Fainting.  Seizures.  Loss of consciousness (coma).  Death. What nutrition changes can be made?  Work with your health care provider or diet and nutrition specialist (dietitian) to make a healthy meal plan that is right for you. Follow your meal plan  carefully.  Eat meals at regular times.  If recommended by your health care provider, have snacks between meals.  Donot skip or delay meals or snacks. You can be at risk for hypoglycemia if you are not getting enough carbohydrates. What lifestyle changes can be made?  Work closely with your health care provider to manage your blood glucose. Make sure you know: ? Your goal blood glucose levels. ? How and when to check your blood glucose. ? The symptoms of hypoglycemia. It is important to treat it right away to keep it from becoming severe.  Do not drink alcohol on an empty stomach.  When you are ill, check your blood glucose more often than usual. Follow your sick day plan whenever you cannot eat or drink normally. Make this plan in advance with your health care provider.  Always check your blood glucose before, during, and after exercise.   How is this treated? This condition can often be treated by immediately eating or drinking something that contains sugar, such as:  Fruit juice, 4-6 oz (120-150 mL).  Regular (not diet) soda, 4-6 oz (120-150 mL).  Low-fat milk, 4 oz (120 mL).  Several pieces of hard candy.  Sugar or honey, 1 Tbsp (15 mL). Treating hypoglycemia if you have diabetes If you are alert and able to swallow safely, follow the 15:15 rule:  Take 15 grams of a rapid-acting carbohydrate. Talk with your health care provider about how much you should take.  Rapid-acting options include: ? Glucose pills (take 15 grams). ?  6-8 pieces of hard candy. ? 4-6 oz (120-150 mL) of fruit juice. ? 4-6 oz (120-150 mL) of regular (not diet) soda.  Check your blood glucose 15 minutes after you take the carbohydrate.  If the repeat blood glucose level is still at or below 70 mg/dL (3.9 mmol/L), take 15 grams of a carbohydrate again.  If your blood glucose level does not increase above 70 mg/dL (3.9 mmol/L) after 3 tries, seek emergency medical care.  After your blood glucose  level returns to normal, eat a meal or a snack within 1 hour. Treating severe hypoglycemia Severe hypoglycemia is when your blood glucose level is at or below 54 mg/dL (3 mmol/L). Severe hypoglycemia is a medical emergency. Get medical help right away. If you have severe hypoglycemia and you cannot eat or drink, you may need an injection of glucagon. A family member or close friend should learn how to check your blood glucose and how to give you a glucagon injection. Ask your health care provider if you need to have an emergency glucagon injection kit available. Severe hypoglycemia may need to be treated in a hospital. The treatment may include getting glucose through an IV. You may also need treatment for the cause of your hypoglycemia. Where to find more information  American Diabetes Association: www.diabetes.[employer] of Diabetes and Digestive and Kidney Diseases: [employer] Contact a health care provider if:  You have problems keeping your blood glucose in your target range.  You have frequent episodes of hypoglycemia. Get help right away if:  You continue to have hypoglycemia symptoms after eating or drinking something containing glucose.  Your blood glucose level is at or below 54 mg/dL (3 mmol/L).  You faint.  You have a seizure. These symptoms may represent a serious problem that is an emergency. Do not wait to see if the symptoms will go away. Get medical help right away. Call your local emergency services (911 in the [place] Summary  Know the symptoms of hypoglycemia, and when you are at risk for it (such as during exercise or when you are sick). Check your blood glucose often when you are at risk for hypoglycemia.  Hypoglycemia can develop quickly, and it can be dangerous if it is not treated right away. If you have a history of severe hypoglycemia, make sure you know how to use your glucagon injection kit.  Make sure you know how to treat hypoglycemia.  Keep a carbohydrate snack available when you may be at risk for hypoglycemia. This information is not intended to replace advice given to you by your health care provider. Make sure you discuss any questions you have with your health care provider. Document Revised: 01/04/2019 Document Reviewed: 05/10/2017 Elsevier Patient Education  2021 [employer].

## 2021-03-02 NOTE — Telephone Encounter (Signed)
Patient concerned about protrusion from anus. Reports its a little sore, but no pain. Roughly 1 cm in size. No blood or itching. She has applied some Vaseline. Stools are pale in color, no constipation or straining. She is concerned about whether she needs to be seen for this issue or needs to treat over the counter.  Sending to PCP CMA for triage.   [name], PharmD Clinical Pharmacist [facility] [phone]

## 2021-03-02 NOTE — Telephone Encounter (Signed)
Spoke to pt. She will try the Prep-H and call back if not improvement.

## 2021-03-02 NOTE — Progress Notes (Signed)
Chronic Care Management Pharmacy Note  03/02/2021 Name:  Angela Bentley MRN:  [id number] DOB:  1941-03-19  Summary: Diabetes - checking BG twice daily, within goal. No hypoglycemia on glimepiride 2 mg daily and Actos 45 mg daily.  Recommendations/Changes made from today's visit: Continue current medications  Plan: Follow up with PCP next month, CCM call in 6 months   Subjective: Angela Bentley is an 80 y.o. year old female who is a primary patient of [name], Theophilus Kinds, MD.  The CCM team was consulted for assistance with disease management and care coordination needs.    Engaged with patient by telephone for follow up visit in response to provider referral for pharmacy case management and/or care coordination services.   Consent to Services:  The patient was given information about Chronic Care Management services, agreed to services, and gave verbal consent prior to initiation of services.  Please see initial visit note for detailed documentation.   Patient Care Team: [name], MD as PCP - General (Internal Medicine) Minna Merritts, MD as PCP - Cardiology (Cardiology) [name], [facility] as Pharmacist (Pharmacist)  Recent office visits: 02/15/21 - Dr. [name], PCP - High sugars after steroid injection. I will change the glimepiride to 2m Discussed SGLT2 inhibitors---could consider but would probably use it instead of the pioglitazone 02/03/21 - Elevated BG telephone note - Pt received steroid injection on 02/03/21. BG still elevated 02/10/21. PCP recommended increase glimepiride to 2 mg daily. Bring log of BG to appt next week. 02/03/21 - Steroid injection   Recent consult visits: 02/16/21 - Neurology - Follow up 18 months with MRI. She request referral for neuro-cognitive testing.  Hospital visits: None in previous 6 months  Objective:  Lab Results  Component Value Date   CREATININE 0.82 10/14/2020   BUN 15 10/14/2020   GFR 67.20 10/14/2020   GFRNONAA >60  03/24/2019   GFRAA >60 03/24/2019   NA 140 10/14/2020   K 3.9 10/14/2020   CALCIUM 9.6 10/14/2020   CO2 31 10/14/2020   GLUCOSE 183 (H) 10/14/2020    Lab Results  Component Value Date/Time   HGBA1C 7.6 (H) 10/14/2020 11:07 AM   HGBA1C 7.3 (A) 04/21/2020 08:44 AM   HGBA1C 7.6 (H) 10/09/2019 09:10 AM   HGBA1C 6.5 07/28/2015 12:00 AM   GFR 67.20 10/14/2020 11:07 AM   GFR 70.00 10/09/2019 09:10 AM    Last diabetic Eye exam:  Lab Results  Component Value Date/Time   HMDIABEYEEXA No Retinopathy 02/21/2020 12:00 AM    Last diabetic Foot exam:  Lab Results  Component Value Date/Time   HMDIABFOOTEX done 10/09/2019 12:00 AM     Lab Results  Component Value Date   CHOL 174 10/14/2020   HDL 48.80 10/14/2020   LDLCALC 91 10/14/2020   TRIG 167.0 (H) 10/14/2020   CHOLHDL 4 10/14/2020    Hepatic Function Latest Ref Rng & Units 10/14/2020 10/09/2019 10/03/2018  Total Protein 6.0 - 8.3 g/dL 6.7 6.8 7.0  Albumin 3.5 - 5.2 g/dL 4.5 4.4 4.3  AST 0 - 37 U/L _0 ALT 0 - 35 U/L _1 Alk Phosphatase 39 - 117 U/L 65 66 60  Total Bilirubin 0.2 - 1.2 mg/dL 0.5 0.6 0.5    Lab Results  Component Value Date/Time   TSH 2.20 06/02/2016 03:21 PM   FREET4 0.96 10/14/2020 11:07 AM   FREET4 0.84 06/02/2016 03:21 PM    CBC Latest Ref Rng & Units 10/14/2020 10/09/2019 03/24/2019  WBC  4.0 - 10.5 K/uL 7.9 9.6 10.0  Hemoglobin 12.0 - 15.0 g/dL 12.4 12.5 12.5  Hematocrit 36.0 - 46.0 % 36.8 37.8 38.1  Platelets 150.0 - 400.0 K/uL 224.0 260.0 260    No results found for: VD25OH  Clinical ASCVD: No  The ASCVD Risk score Mikey Bussing DC Jr., et al., 2013) failed to calculate for the following reasons:   The 2013 ASCVD risk score is only valid for ages 81 to 55    Depression screen PHQ 2/9 10/14/2020 10/09/2019 10/09/2019  Decreased Interest 0 0 0  Down, Depressed, Hopeless 0 0 0  PHQ - 2 Score 0 0 0  Some recent data might be hidden    Social History   Tobacco Use  Smoking Status Former Smoker   . Quit date: 09/26/1993  . Years since quitting: 27.4  Smokeless Tobacco Never Used  Tobacco Comment   quit in 1996   BP Readings from Last 3 Encounters:  02/15/21 (!) 142/76  02/03/21 140/74  01/07/21 122/68   Pulse Readings from Last 3 Encounters:  02/15/21 86  02/03/21 98  01/07/21 83   Wt Readings from Last 3 Encounters:  02/15/21 195 lb (88.5 kg)  02/03/21 201 lb 4 oz (91.3 kg)  01/07/21 200 lb 8 oz (90.9 kg)   BMI Readings from Last 3 Encounters:  02/15/21 31.96 kg/m  02/03/21 32.98 kg/m  01/07/21 32.86 kg/m    Assessment/Interventions: Review of patient past medical history, allergies, medications, health status, including review of consultants reports, laboratory and other test data, was performed as part of comprehensive evaluation and provision of chronic care management services.   SDOH:  (Social Determinants of Health) assessments and interventions performed: Yes SDOH Interventions   Flowsheet Row Most Recent Value  SDOH Interventions   Financial Strain Interventions Intervention Not Indicated     SDOH Screenings   Alcohol Screen: Not on file  Depression (PHQ2-9): Low Risk   . PHQ-2 Score: 0  Financial Resource Strain: Low Risk   . Difficulty of Paying Living Expenses: Not very hard  Food Insecurity: Not on file  Housing: Not on file  Physical Activity: Not on file  Social Connections: Not on file  Stress: Not on file  Tobacco Use: Medium Risk  . Smoking Tobacco Use: Former Smoker  . Smokeless Tobacco Use: Never Used  Transportation Needs: Not on file    CCM Care Plan  Allergies  Allergen Reactions  . Penicillins     Has patient had a PCN reaction causing immediate rash, facial/tongue/throat swelling, SOB or lightheadedness with hypotension: Yes Has patient had a PCN reaction causing severe rash involving mucus membranes or skin necrosis: Yes Has patient had a PCN reaction that required hospitalization No Has patient had a PCN reaction  occurring within the last 10 years: No If all of the above answers are "NO", then may proceed with Cephalosporin use.    . Sulfa Antibiotics     Rash/itching  . Azithromycin Palpitations    Medications Reviewed Today    Reviewed by [name], [facility] (Pharmacist) on 03/02/21 at [place] List Status: <None>  Medication Order Taking? Sig Documenting Provider Last Dose Status Informant  diclofenac Sodium (VOLTAREN) 1 % GEL [id number]  Apply topically 4 (four) times daily as needed. [provider]  Active   fluticasone (VERAMYST) 27.5 MCG/SPRAY nasal spray [id number]  Place 2 sprays into the nose daily as needed for allergies.  [provider]  Active Self  glimepiride (AMARYL) 2 MG  tablet [id number] Yes Take 1 tablet (2 mg total) by mouth daily before breakfast. [name], MD Taking Active   HYDROcodone bit-homatropine (HYCODAN) 5-1.5 MG/5ML syrup [id number]  Take 5 mLs by mouth at bedtime as needed for cough. [provider]  Active   losartan-hydrochlorothiazide (HYZAAR) 100-12.5 MG tablet [id number] Yes Take 1 tablet by mouth daily. [name], MD Taking Active   metoprolol succinate (TOPROL-XL) 25 MG 24 hr tablet [id number] Yes Take 1 tablet (25 mg total) by mouth daily. [name], MD Taking Active   Multiple Vitamin (MULTIVITAMIN) tablet [id number]  Take 1 tablet by mouth daily. [provider]  Active   pioglitazone (ACTOS) 45 MG tablet [id number] Yes Take 1 tablet (45 mg total) by mouth daily. [name], MD Taking Active   rivaroxaban (XARELTO) 20 MG TABS tablet [id number] Yes TAKE 1 TABLET (20 MG TOTAL) BY MOUTH DAILY WITH SUPPER. [name], MD Taking Active           Patient Active Problem List   Diagnosis Date Noted  . Chronic anticoagulation 01/07/2021  . Allergic cough 01/07/2021  . Right knee pain 07/10/2020  . Hoarseness of voice 04/21/2020  . Overactive bladder 10/09/2019  . Tinnitus, bilateral  11/28/2017  . Meningioma ([place]) 09/21/2017  . PAF (paroxysmal atrial fibrillation) ([place]) 02/03/2017  . Preventative health care 06/02/2016  . Advance directive discussed with patient 06/02/2016  . Hypertension   . Type 2 diabetes, controlled, with peripheral neuropathy ([place])   . Generalized osteoarthritis of multiple sites   . GERD (gastroesophageal reflux disease)   . Hyperthyroidism   . Retinal hole of right eye 06/26/2014  . Amblyopia, right 05/08/2014  . Status post intraocular lens implant 05/08/2014    Immunization History  Administered Date(s) Administered  . Influenza, High Dose Seasonal PF 07/09/2014, 07/23/2015, 06/02/2016, 07/02/2019  . Influenza,inj,Quad PF,6+ Mos 06/23/2017, 07/17/2018  . Influenza-Unspecified 07/23/2015, 06/22/2020  . Moderna Sars-Covid-2 Vaccination 10/08/2019, 11/05/2019, 07/24/2020  . Pneumococcal Conjugate-13 01/30/2014  . Pneumococcal Polysaccharide-23 09/13/2004, 11/03/2016  . Tdap 10/23/2012  . Zoster Recombinat (Shingrix) 11/19/2018, 04/11/2019  . Zoster, Live 10/19/2006    Conditions to be addressed/monitored:  Hypertension and Diabetes  Care Plan : [place]  Updates made by [name], [place] since 03/02/2021 12:00 AM    Problem: CHL AMB "PATIENT-SPECIFIC PROBLEM"     Long-Range Goal: Disease Management   Start Date: 03/02/2021  Priority: High  Note:   Current Barriers:  . None identified  Pharmacist Clinical Goal(s):  Marland Kitchen Patient will contact provider office for questions/concerns as evidenced notation of same in electronic health record through collaboration with PharmD and provider.   Interventions: . 1:1 collaboration with [name], MD regarding development and update of comprehensive plan of care as evidenced by provider attestation and co-signature . Inter-disciplinary care team collaboration (see longitudinal plan of care) . Comprehensive medication review performed; medication list updated in  electronic medical record  Hypertension (BP goal <140/90) -Controlled - per clinic readings within goal  -Current treatment:  Metoprolol succinate 25 mg - 1 tablet daily  Losartan/HCTZ 100-12.5 mg - 1 tablet daily  -Medications previously tried: none   -Current home readings: occasionally monitors home BP -Current dietary habits: follows low salt diet -Current exercise habits: PT, walks dog some  -Denies hypotensive/hypertensive symptoms; She has felt a little off balance since steroid injection. She is using a cane. No falls. She does not think this is related to low BP. -Educated on  BP goals and benefits of medications for prevention of heart attack, stroke and kidney damage; -Counseled to monitor BP at home monthly, document, and provide log at future appointments. Call if BP < 90/60 mmHg. -Recommended to continue current medication  Diabetes (A1c goal <8%) -Controlled (A1c 7.6% 09/2020) -Current medications:  Glimepiride 2 mg - 1 tablet daily (increased 02/10/21)  Pioglitazone 45 mg - 1 tablet daily  -Medications previously tried: metformin (diarrhea) -Current home glucose readings - has been monitoring more lately due to high after steroid injection 02/03/21, checking twice daily  . fasting glucose:  o 02/18/21 7 AM - 135  o 02/27/21 9:30 AM - 139 o 02/28/21 7:45 AM - 143 . post prandial glucose:  o 03/01/21 11:30 AM - 95 (after breakfast) o 02/28/21 9:45 AM - 146 (after breakfast) o 02/28/21 1 PM - 128 (after lunch) o 02/26/21 - 11:45 188 (after breakfast) -Denies hypoglycemic/hyperglycemic symptoms -Current meal patterns: she has changed diet, limiting carbs and sweets  . breakfast: eggs . lunch: salads . dinner: varies . snacks: diabetic cookies  -Current exercise: walking more lately -Educated on A1c and blood sugar goals; Fasting goal - 80-140, Post-prandial < 180. -Counseled to check feet daily and get yearly eye exams - these are due for 2022 -Recommended to continue current  medication; Schedule annual eye and foot exam  Patient Goals/Self-Care Activities . Patient will:  - check glucose routinely until next visit with Dr. [name], document, and provide at future appointments check blood pressure at least once monthly and with symptoms of dizziness, document, and provide at future appointments  Follow Up Plan: Telephone follow up appointment with care management team member scheduled for: 6 months (BG and BP log review)     Medication Assistance: None required.  Patient affirms current coverage meets needs.  Compliance/Adherence/Medication fill history: Care Gaps: Diabetic eye and foot exam due Shingrix Vaccine due Mammogram due Pneumococcal vaccine due  [facility] Medication:                            Last Fill:         Day Supply Data unavailable with [facility] Order  Patient's preferred pharmacy is:  [facility] [address], [place] [address] Phone: [phone] Fax: [phone]  [id number] [place]: [phone]: [phone] Uses pill box? Yes - 7 day pillbox  Pt endorses 100% compliance  We discussed: Current pharmacy is preferred with insurance plan and patient is satisfied with pharmacy services Patient decided to: Continue current medication management strategy  Care Plan and Follow Up Patient Decision:  Patient agrees to Care Plan and Follow-up.  MDebbora Dus PharmD Clinical Pharmacist [phone]

## 2021-03-10 ENCOUNTER — Encounter: Payer: Self-pay | Admitting: Dermatology

## 2021-03-10 ENCOUNTER — Ambulatory Visit (INDEPENDENT_AMBULATORY_CARE_PROVIDER_SITE_OTHER): Payer: Medicare Other | Admitting: Dermatology

## 2021-03-10 ENCOUNTER — Other Ambulatory Visit: Payer: Self-pay

## 2021-03-10 DIAGNOSIS — B078 Other viral warts: Secondary | ICD-10-CM

## 2021-03-10 DIAGNOSIS — L578 Other skin changes due to chronic exposure to nonionizing radiation: Secondary | ICD-10-CM

## 2021-03-10 DIAGNOSIS — Z872 Personal history of diseases of the skin and subcutaneous tissue: Secondary | ICD-10-CM | POA: Diagnosis not present

## 2021-03-10 DIAGNOSIS — L570 Actinic keratosis: Secondary | ICD-10-CM | POA: Diagnosis not present

## 2021-03-10 NOTE — Patient Instructions (Addendum)
Cryotherapy Aftercare  Wash gently with soap and water everyday.   Apply Vaseline and Band-Aid daily until healed.   Prior to procedure, discussed risks of blister formation, small wound, skin dyspigmentation, or rare scar following cryotherapy. Recommend Vaseline ointment to treated areas while healing.  Recommend taking Heliocare sun protection supplement daily in sunny weather for additional sun protection. For maximum protection on the sunniest days, you can take up to 2 capsules of regular Heliocare OR take 1 capsule of Heliocare Ultra. For prolonged exposure (such as a full day in the sun), you can repeat your dose of the supplement 4 hours after your first dose. Heliocare can be purchased at [facility] or at [email or web address].    Recommend daily broad spectrum sunscreen SPF 30+ to sun-exposed areas, reapply every 2 hours as needed. Call for new or changing lesions.  Staying in the shade or wearing long sleeves, sun glasses (UVA+UVB protection) and wide brim hats (4-inch brim around the entire circumference of the hat) are also recommended for sun protection.   If you have any questions or concerns for your doctor, please call our main line at [phone] and press option 4 to reach your doctor's medical assistant. If no one answers, please leave a voicemail as directed and we will return your call as soon as possible. Messages left after 4 pm will be answered the following business day.   You may also send [place] a message via [place]. We typically respond to MyChart messages within 1-2 business days.  For prescription refills, please ask your pharmacy to contact our office. Our fax number is [phone].  If you have an urgent issue when the clinic is closed that cannot wait until the next business day, you can page your doctor at the number below.    Please note that while we do our best to be available for urgent issues outside of office hours, we are not available 24/7.   If  you have an urgent issue and are unable to reach [place], you may choose to seek medical care at your doctor's office, retail clinic, urgent care center, or emergency room.  If you have a medical emergency, please immediately call 911 or go to the emergency department.  Pager Numbers  - Dr. Nehemiah Massed: [phone]  - Dr. Laurence Ferrari: [phone]  - Dr. Nicole Kindred: [phone]  In the event of inclement weather, please call our main line at [phone] for an update on the status of any delays or closures.  Dermatology Medication Tips: Please keep the boxes that topical medications come in in order to help keep track of the instructions about where and how to use these. Pharmacies typically print the medication instructions only on the boxes and not directly on the medication tubes.   If your medication is too expensive, please contact our office at [phone] option 4 or send [place] a message through [place].   We are unable to tell what your co-pay for medications will be in advance as this is different depending on your insurance coverage. However, we may be able to find a substitute medication at lower cost or fill out paperwork to get insurance to cover a needed medication.   If a prior authorization is required to get your medication covered by your insurance company, please allow [place] 1-2 business days to complete this process.  Drug prices often vary depending on where the prescription is filled and some pharmacies may offer cheaper prices.  The website www.goodrx.com contains coupons for medications through  different pharmacies. The prices here do not account for what the cost may be with help from insurance (it may be cheaper with your insurance), but the website can give you the price if you did not use any insurance.  - You can print the associated coupon and take it with your prescription to the pharmacy.  - You may also stop by our office during regular business hours and pick up a GoodRx  coupon card.  - If you need your prescription sent electronically to a different pharmacy, notify our office through [facility] or by phone at [phone] option 4.

## 2021-03-10 NOTE — Progress Notes (Signed)
Follow-Up Visit   Subjective  Angela Bentley is a 80 y.o. female who presents for the following: Follow-up (Patient here today for 2 month AK follow up. Patient does have a spot at chin that has been there for 2 1/2 - 3 weeks that she is concerned about. This is the third time that it has come up in the same place. Patient advises she picks at it and eventually it will go away. ).   The following portions of the chart were reviewed this encounter and updated as appropriate:   Tobacco  Allergies  Meds  Problems  Med Hx  Surg Hx  Fam Hx       Review of Systems:  No other skin or systemic complaints except as noted in HPI or Assessment and Plan.  Objective  Well appearing patient in no apparent distress; mood and affect are within normal limits.  A focused examination was performed including face, hands, arms, right calf, right axilla. Relevant physical exam findings are noted in the Assessment and Plan.  Right Chin Verrucous papules -- Discussed viral etiology and contagion.   Left cheek x 1, right cheek x 1, left hand x 1, left wrist x 1, right hand x 1, right axilla x 1 (6) Erythematous thin papules/macules with gritty scale.    Assessment & Plan  Other viral warts Right Chin  Recurrent. Recurred in last 2-3 weeks.   If not responding as expected to treatment, may consider biopsy.  Prior to procedure, discussed risks of blister formation, small wound, skin dyspigmentation, or rare scar following cryotherapy. Recommend Vaseline ointment to treated areas while healing.  Discussed viral etiology and risk of spread.  Discussed multiple treatments may be required to clear warts.  Discussed possible post-treatment dyspigmentation and risk of recurrence.   Destruction of lesion - Right Chin  Destruction method: cryotherapy   Informed consent: discussed and consent obtained   Lesion destroyed using liquid nitrogen: Yes   Cryotherapy cycles:  2 Outcome: patient tolerated  procedure well with no complications   Post-procedure details: wound care instructions given    AK (actinic keratosis) (6) Left cheek x 1, right cheek x 1, left hand x 1, left wrist x 1, right hand x 1, right axilla x 1  Prior to procedure, discussed risks of blister formation, small wound, skin dyspigmentation, or rare scar following cryotherapy. Recommend Vaseline ointment to treated areas while healing.  Actinic keratoses are precancerous spots that appear secondary to cumulative UV radiation exposure/sun exposure over time. They are chronic with expected duration over 1 year. A portion of actinic keratoses will progress to squamous cell carcinoma of the skin. It is not possible to reliably predict which spots will progress to skin cancer and so treatment is recommended to prevent development of skin cancer.  Recommend daily broad spectrum sunscreen SPF 30+ to sun-exposed areas, reapply every 2 hours as needed.  Recommend staying in the shade or wearing long sleeves, sun glasses (UVA+UVB protection) and wide brim hats (4-inch brim around the entire circumference of the hat). Call for new or changing lesions.   Destruction of lesion - Left cheek x 1, right cheek x 1, left hand x 1, left wrist x 1, right hand x 1, right axilla x 1  Destruction method: cryotherapy   Informed consent: discussed and consent obtained   Lesion destroyed using liquid nitrogen: Yes   Cryotherapy cycles:  2 Outcome: patient tolerated procedure well with no complications   Post-procedure details: wound care  instructions given    History of PreCancerous Actinic Keratosis  - site(s) of PreCancerous Actinic Keratosis clear today. - these may recur and new lesions may form requiring treatment to prevent transformation into skin cancer - observe for new or changing spots and contact [place] for appointment if occur - photoprotection with sun protective clothing; sunglasses and broad spectrum sunscreen  with SPF of at least 30 + and frequent self skin exams recommended - yearly exams by a dermatologist recommended for persons with history of PreCancerous Actinic Keratoses  Actinic Damage - chronic, secondary to cumulative UV radiation exposure/sun exposure over time - diffuse scaly erythematous macules with underlying dyspigmentation - Recommend daily broad spectrum sunscreen SPF 30+ to sun-exposed areas, reapply every 2 hours as needed.  - Recommend staying in the shade or wearing long sleeves, sun glasses (UVA+UVB protection) and wide brim hats (4-inch brim around the entire circumference of the hat). - Call for new or changing lesions.  Return in about 2 months (around 05/10/2021) for AK follow up, Wart.  Graciella Belton, RMA, am acting as scribe for Forest Gleason, MD .  Documentation: I have reviewed the above documentation for accuracy and completeness, and I agree with the above.  Forest Gleason, MD

## 2021-03-17 ENCOUNTER — Telehealth: Payer: Self-pay

## 2021-03-17 NOTE — Telephone Encounter (Signed)
Patient requested to discontinue CCM services at this time.  [name], PharmD Clinical Pharmacist [facility] at [facility] [phone]

## 2021-04-05 ENCOUNTER — Other Ambulatory Visit: Payer: Self-pay | Admitting: Neurosurgery

## 2021-04-05 DIAGNOSIS — D329 Benign neoplasm of meninges, unspecified: Secondary | ICD-10-CM

## 2021-04-13 ENCOUNTER — Ambulatory Visit: Payer: Medicare Other | Admitting: Internal Medicine

## 2021-04-27 ENCOUNTER — Encounter: Payer: Self-pay | Admitting: Internal Medicine

## 2021-04-27 ENCOUNTER — Ambulatory Visit (INDEPENDENT_AMBULATORY_CARE_PROVIDER_SITE_OTHER): Payer: Medicare Other | Admitting: Internal Medicine

## 2021-04-27 ENCOUNTER — Other Ambulatory Visit: Payer: Self-pay

## 2021-04-27 DIAGNOSIS — D329 Benign neoplasm of meninges, unspecified: Secondary | ICD-10-CM

## 2021-04-27 DIAGNOSIS — I10 Essential (primary) hypertension: Secondary | ICD-10-CM | POA: Diagnosis not present

## 2021-04-27 DIAGNOSIS — E1142 Type 2 diabetes mellitus with diabetic polyneuropathy: Secondary | ICD-10-CM | POA: Diagnosis not present

## 2021-04-27 LAB — POCT GLYCOSYLATED HEMOGLOBIN (HGB A1C): Hemoglobin A1C: 7 % — AB (ref 4.0–5.6)

## 2021-04-27 LAB — HM DIABETES FOOT EXAM

## 2021-04-27 NOTE — Assessment & Plan Note (Signed)
A1c down to 7% today  No change in glimepiride and actos Asked her to check at least weekly Mild neuropathy

## 2021-04-27 NOTE — Progress Notes (Signed)
Subjective:    Patient ID: Angela Bentley, female    DOB: 09-25-1941, 80 y.o.   MRN: [id number]  HPI Here for follow up of diabetes This visit occurred during the SARS-CoV-2 public health emergency.  Safety protocols were in place, including screening questions prior to the visit, additional usage of staff PPE, and extensive cleaning of exam room while observing appropriate contact time as indicated for disinfecting solutions.   Sugars did finally improve again---elevations lasted about 10 days (but the steroid did help her knee) Checks sugars occasionally---usually okay (she doesn't remember numbers) No hypoglycemic reactions  No chest pain or SOB Still with some balance problems (worsening)--but no dizziness Did have MRI ordered for meningioma--but she is not sure about this (had gamma ray done at [facility] last November)  Current Outpatient Medications on File Prior to Visit  Medication Sig Dispense Refill   diclofenac Sodium (VOLTAREN) 1 % GEL Apply topically 4 (four) times daily as needed.     fluticasone (VERAMYST) 27.5 MCG/SPRAY nasal spray Place 2 sprays into the nose daily as needed for allergies.      glimepiride (AMARYL) 2 MG tablet Take 1 tablet (2 mg total) by mouth daily before breakfast. 90 tablet 3   HYDROcodone bit-homatropine (HYCODAN) 5-1.5 MG/5ML syrup Take 5 mLs by mouth at bedtime as needed for cough.     losartan-hydrochlorothiazide (HYZAAR) 100-12.5 MG tablet Take 1 tablet by mouth daily. 90 tablet 3   metoprolol succinate (TOPROL-XL) 25 MG 24 hr tablet Take 1 tablet (25 mg total) by mouth daily. 90 tablet 3   Multiple Vitamin (MULTIVITAMIN) tablet Take 1 tablet by mouth daily.     pioglitazone (ACTOS) 45 MG tablet Take 1 tablet (45 mg total) by mouth daily. 90 tablet 3   rivaroxaban (XARELTO) 20 MG TABS tablet TAKE 1 TABLET (20 MG TOTAL) BY MOUTH DAILY WITH SUPPER. 90 tablet 3   No current facility-administered medications on file prior to visit.     Allergies  Allergen Reactions   Penicillins     Has patient had a PCN reaction causing immediate rash, facial/tongue/throat swelling, SOB or lightheadedness with hypotension: Yes Has patient had a PCN reaction causing severe rash involving mucus membranes or skin necrosis: Yes Has patient had a PCN reaction that required hospitalization No Has patient had a PCN reaction occurring within the last 10 years: No If all of the above answers are "NO", then may proceed with Cephalosporin use.     Sulfa Antibiotics     Rash/itching   Azithromycin Palpitations    Past Medical History:  Diagnosis Date   Actinic keratosis 06/25/2020   L pretibia distal - bx proven    Balance problems    BCC (basal cell carcinoma of skin) 12/17/2020   right temple   Brain tumor ([place])    a. Left intraventricular tumor ->stable by 06/2017 MRI. Followed @ [facility].   Dysplastic nevus 06/25/2020   R flank - moderate   Gait disturbance    a. Unsteady on feet/balance difficulty.   Generalized osteoarthritis of multiple sites    GERD (gastroesophageal reflux disease)    History of DVT (deep vein thrombosis) 2016   estrogen and airflights. Rx with xarelto for 3 months   History of stress test    a. 11/2016 ETT: No ST/T changes.  Performed to assess PAC/PVC burden with activity ->No ectopy noted.   Hypertension    Hyperthyroidism    treated briefly in college   Insomnia  Obesity    PAF (paroxysmal atrial fibrillation) ([place])    a. 12/2016 Event Monitor: brief run of PAF-->CHA2DS2VASc = 5--> Xarelto.   Plantar fasciitis    Retinal tear of left eye    Rosacea    Squamous cell carcinoma of skin 09/07/2017   R dorsum of hand    Type 2 diabetes, controlled, with peripheral neuropathy ([place])    a. 11/2016 A1c 7.4.   Uterine fibroid     Past Surgical History:  Procedure Laterality Date   CARPAL TUNNEL RELEASE Right    Dr Burney Gauze   CATARACT EXTRACTION     Gamma knife  07/2020   intraventricular  mass---meningioma or choroid plexus papilloma   RETINAL TEAR REPAIR CRYOTHERAPY     10/09/13, then again 3/15    Family History  Problem Relation Age of Onset   Diabetes Father    Pancreatic cancer Father    Diabetes Other        grandmother    Social History   Socioeconomic History   Marital status: Married    Spouse name: Not on file   Number of children: 3   Years of education: Not on file   Highest education level: Not on file  Occupational History   Occupation: Non profit management    Comment: [employer] and others  Tobacco Use   Smoking status: Former    Types: Cigarettes    Quit date: 09/26/1993    Years since quitting: 27.6   Smokeless tobacco: Never   Tobacco comments:    quit in 1996  Vaping Use   Vaping Use: Never used  Substance and Sexual Activity   Alcohol use: Yes    Alcohol/week: 0.0 - 1.0 standard drinks   Drug use: No   Sexual activity: Not Currently    Partners: Male  Other Topics Concern   Not on file  Social History Narrative   Has living will   Husband is health care POA-- or daughter Izora Gala   Would accept resuscitation attempts   Would not want tube feeds if cognitively unaware   Social Determinants of Health   Financial Resource Strain: Low Risk    Difficulty of Paying Living Expenses: Not very hard  Food Insecurity: Not on file  Transportation Needs: Not on file  Physical Activity: Not on file  Stress: Not on file  Social Connections: Not on file  Intimate Partner Violence: Not on file   Review of Systems Sleeps okay--disturbed by husband's snoring Appetite is okay      Objective:   Physical Exam Constitutional:      Appearance: Normal appearance.  Cardiovascular:     Rate and Rhythm: Normal rate and regular rhythm.     Pulses: Normal pulses.     Heart sounds: No murmur heard.   No gallop.  Pulmonary:     Effort: Pulmonary effort is normal.     Breath sounds: Normal breath sounds. No wheezing or rales.  Musculoskeletal:      Cervical back: Neck supple.     Right lower leg: No edema.     Left lower leg: No edema.  Lymphadenopathy:     Cervical: No cervical adenopathy.  Skin:    Findings: No rash.     Comments: No foot lesions  Neurological:     Mental Status: She is alert.     Comments: Mild decreased sensation in plantar feet           Assessment & Plan:

## 2021-04-27 NOTE — Assessment & Plan Note (Signed)
BP Readings from Last 3 Encounters:  04/27/21 138/76  02/15/21 (!) 142/76  02/03/21 140/74   Good control on losartan/HCTZ/metoprolol

## 2021-04-27 NOTE — Assessment & Plan Note (Signed)
Improved since gamma knife Rx Still with some balance issues

## 2021-04-27 NOTE — Addendum Note (Signed)
Addended by: Pilar Grammes on: 04/27/2021 08:49 AM   Modules accepted: Orders

## 2021-05-13 ENCOUNTER — Other Ambulatory Visit: Payer: Self-pay

## 2021-05-13 ENCOUNTER — Ambulatory Visit: Payer: Medicare Other | Admitting: Nurse Practitioner

## 2021-05-13 VITALS — HR 80 | Temp 98.1°F

## 2021-05-13 DIAGNOSIS — L03115 Cellulitis of right lower limb: Secondary | ICD-10-CM

## 2021-05-13 MED ORDER — DOXYCYCLINE HYCLATE 100 MG PO TABS: 100.0000 mg | ORAL_TABLET | Freq: Two times a day (BID) | ORAL | 0 refills | Status: DC

## 2021-05-13 NOTE — Patient Instructions (Addendum)
To start doxycycline take with food Use probiotic (florastor) twice daily.  Have yogurt

## 2021-05-13 NOTE — Progress Notes (Signed)
Careteam: Patient Care Team: [name], MD as PCP - General (Internal Medicine) Minna Merritts, MD as PCP - Cardiology (Cardiology) [name], [facility] as Pharmacist (Pharmacist)  Advanced Directive information    Allergies  Allergen Reactions   Penicillins     Has patient had a PCN reaction causing immediate rash, facial/tongue/throat swelling, SOB or lightheadedness with hypotension: Yes Has patient had a PCN reaction causing severe rash involving mucus membranes or skin necrosis: Yes Has patient had a PCN reaction that required hospitalization No Has patient had a PCN reaction occurring within the last 10 years: No If all of the above answers are "NO", then may proceed with Cephalosporin use.     Sulfa Antibiotics     Rash/itching   Azithromycin Palpitations    Chief Complaint  Patient presents with   Acute Visit    Redness to right lower leg.      HPI: Patient is a 80 y.o. female seen in today at the [facility] for redness to lower leg. Hx of a fib, htn, seasonal allergies, brain tumor s/p gamma knife 5 days ago was getting out of her sons Lucianne Lei and scraped her leg.  She has been using iodine to clean.  No pain or fever noted.  This [place] she went to the [place] nurse office and she sent her to be seen in clinic. Review of Systems:  Review of Systems  Constitutional:  Negative for chills, fever, malaise/fatigue and weight loss.  HENT:  Negative for tinnitus.   Respiratory:  Negative for cough.   Cardiovascular:  Negative for chest pain, palpitations and leg swelling.  Gastrointestinal:  Negative for abdominal pain, constipation, diarrhea and heartburn.  Musculoskeletal:  Negative for myalgias.  Skin: Negative.        Redness to leg. Slightly warm to touch   Past Medical History:  Diagnosis Date   Actinic keratosis 06/25/2020   L pretibia distal - bx proven    Balance problems    BCC (basal cell carcinoma of skin) 12/17/2020   right temple    Brain tumor ([place])    a. Left intraventricular tumor ->stable by 06/2017 MRI. Followed @ [facility].   Dysplastic nevus 06/25/2020   R flank - moderate   Gait disturbance    a. Unsteady on feet/balance difficulty.   Generalized osteoarthritis of multiple sites    GERD (gastroesophageal reflux disease)    History of DVT (deep vein thrombosis) 2016   estrogen and airflights. Rx with xarelto for 3 months   History of stress test    a. 11/2016 ETT: No ST/T changes.  Performed to assess PAC/PVC burden with activity ->No ectopy noted.   Hypertension    Hyperthyroidism    treated briefly in college   Insomnia    Obesity    PAF (paroxysmal atrial fibrillation) ([place])    a. 12/2016 Event Monitor: brief run of PAF-->CHA2DS2VASc = 5--> Xarelto.   Plantar fasciitis    Retinal tear of left eye    Rosacea    Squamous cell carcinoma of skin 09/07/2017   R dorsum of hand    Type 2 diabetes, controlled, with peripheral neuropathy ([place])    a. 11/2016 A1c 7.4.   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   RETINAL  TEAR REPAIR CRYOTHERAPY     10/09/13, then again 3/15   Social History:   reports that she quit smoking about 27 years ago. Her smoking use included cigarettes. She has never used smokeless tobacco. She reports current alcohol use. She reports that she does not use drugs.  Family History  Problem Relation Age of Onset   Diabetes Father    Pancreatic cancer Father    Diabetes Other        grandmother    Medications: Patient's Medications  New Prescriptions   DOXYCYCLINE (VIBRA-TABS) 100 MG TABLET    Take 1 tablet (100 mg total) by mouth 2 (two) times daily.  Previous Medications   DICLOFENAC SODIUM (VOLTAREN) 1 % GEL    Apply topically 4 (four) times daily as needed.   FLUTICASONE (VERAMYST) 27.5  MCG/SPRAY NASAL SPRAY    Place 2 sprays into the nose daily as needed for allergies.    GLIMEPIRIDE (AMARYL) 2 MG TABLET    Take 1 tablet (2 mg total) by mouth daily before breakfast.   HYDROCODONE BIT-HOMATROPINE (HYCODAN) 5-1.5 MG/5ML SYRUP    Take 5 mLs by mouth at bedtime as needed for cough.   LOSARTAN-HYDROCHLOROTHIAZIDE (HYZAAR) 100-12.5 MG TABLET    Take 1 tablet by mouth daily.   METOPROLOL SUCCINATE (TOPROL-XL) 25 MG 24 HR TABLET    Take 1 tablet (25 mg total) by mouth daily.   MULTIPLE VITAMIN (MULTIVITAMIN) TABLET    Take 1 tablet by mouth daily.   PIOGLITAZONE (ACTOS) 45 MG TABLET    Take 1 tablet (45 mg total) by mouth daily.   RIVAROXABAN (XARELTO) 20 MG TABS TABLET    TAKE 1 TABLET (20 MG TOTAL) BY MOUTH DAILY WITH SUPPER.  Modified Medications   No medications on file  Discontinued Medications   No medications on file    Physical Exam:  Vitals:   05/13/21 1015  Pulse: 80  Temp: 98.1 F (36.7 C)  SpO2: 98%   There is no height or weight on file to calculate BMI. Wt Readings from Last 3 Encounters:  04/27/21 197 lb (89.4 kg)  02/15/21 195 lb (88.5 kg)  02/03/21 201 lb 4 oz (91.3 kg)    Physical Exam Constitutional:      Appearance: Normal appearance.  HENT:     Head: Normocephalic and atraumatic.  Cardiovascular:     Rate and Rhythm: Normal rate and regular rhythm.  Pulmonary:     Effort: Pulmonary effort is normal.     Breath sounds: Normal breath sounds.  Skin:    General: Skin is warm and dry.     Findings: Erythema (2 x 2 cm skin tear noted to right lower leg with slight redness going down leg, slight warmth noted) present.  Neurological:     General: No focal deficit present.     Mental Status: She is alert and oriented to person, place, and time.    Labs reviewed: Basic Metabolic Panel: Recent Labs    10/14/20 1107  NA 140  K 3.9  CL 102  CO2 31  GLUCOSE 183*  BUN 15  CREATININE 0.82  CALCIUM 9.6   Liver Function Tests: Recent Labs     10/14/20 1107  AST 14  ALT 13  ALKPHOS 65  BILITOT 0.5  PROT 6.7  ALBUMIN 4.5   No results for input(s): LIPASE, AMYLASE in the last 8760 hours. No results for input(s): AMMONIA in the last 8760 hours. CBC: Recent Labs    10/14/20 1107  WBC 7.9  HGB 12.4  HCT 36.8  MCV 86.9  PLT 224.0   Lipid Panel: Recent Labs    10/14/20 1107  CHOL 174  HDL 48.80  LDLCALC 91  TRIG 167.0*  CHOLHDL 4   TSH: No results for input(s): TSH in the last 8760 hours. A1C: Lab Results  Component Value Date   HGBA1C 7.0 (A) 04/27/2021     Assessment/Plan 1. Cellulitis of right lower extremity Mild erythema and warmth noted under skin tear to right leg.  Will treat with doxycycline twice daily for 1 week. To take with food and probiotic. Follow up precautions given.  - doxycycline (VIBRA-TABS) 100 MG tablet; Take 1 tablet (100 mg total) by mouth 2 (two) times daily.  Dispense: 14 tablet; Refill: 0  2. Skin tear Cleaned with saline and xeroform with nonstick dressing applied.  -directions given for skin care and follow up precautions given.    Carlos American. [place], [facility] [phone]

## 2021-05-18 ENCOUNTER — Ambulatory Visit (INDEPENDENT_AMBULATORY_CARE_PROVIDER_SITE_OTHER): Payer: Medicare Other | Admitting: Dermatology

## 2021-05-18 ENCOUNTER — Encounter (INDEPENDENT_AMBULATORY_CARE_PROVIDER_SITE_OTHER): Payer: Medicare Other | Admitting: Family Medicine

## 2021-05-18 ENCOUNTER — Other Ambulatory Visit: Payer: Self-pay

## 2021-05-18 DIAGNOSIS — L570 Actinic keratosis: Secondary | ICD-10-CM

## 2021-05-18 DIAGNOSIS — B078 Other viral warts: Secondary | ICD-10-CM

## 2021-05-18 DIAGNOSIS — L03115 Cellulitis of right lower limb: Secondary | ICD-10-CM | POA: Diagnosis not present

## 2021-05-18 DIAGNOSIS — T148XXA Other injury of unspecified body region, initial encounter: Secondary | ICD-10-CM | POA: Diagnosis not present

## 2021-05-18 MED ORDER — CLINDAMYCIN HCL 300 MG PO CAPS: 600.0000 mg | ORAL_CAPSULE | Freq: Three times a day (TID) | ORAL | 0 refills | Status: AC

## 2021-05-18 MED ORDER — MUPIROCIN 2 % EX OINT: 1.0000 "application " | TOPICAL_OINTMENT | Freq: Every day | CUTANEOUS | 0 refills | Status: DC

## 2021-05-18 MED ORDER — IMIQUIMOD 5 % EX CREA: TOPICAL_CREAM | Freq: Every day | CUTANEOUS | 0 refills | Status: DC

## 2021-05-18 NOTE — Patient Instructions (Addendum)
Cryotherapy Aftercare  Wash gently with soap and water everyday.   Apply Vaseline and Band-Aid daily until healed.   Prior to procedure, discussed risks of blister formation, small wound, skin dyspigmentation, or rare scar following cryotherapy. Recommend Vaseline ointment to treated areas while healing.  If spot at right chin not cleared in 2 weeks start thin layer of imiquimod to affected area at bedtime.   D/c doxycycline  Start clindamycin '600mg'$  TID x 7 days   Recommend yogurt or prebiotic/probiotic daily x 3 months  If you have any questions or concerns for your doctor, please call our main line at [phone] and press option 4 to reach your doctor's medical assistant. If no one answers, please leave a voicemail as directed and we will return your call as soon as possible. Messages left after 4 pm will be answered the following business day.   You may also send [place] a message via [place]. We typically respond to MyChart messages within 1-2 business days.  For prescription refills, please ask your pharmacy to contact our office. Our fax number is [phone].  If you have an urgent issue when the clinic is closed that cannot wait until the next business day, you can page your doctor at the number below.    Please note that while we do our best to be available for urgent issues outside of office hours, we are not available 24/7.   If you have an urgent issue and are unable to reach [place], you may choose to seek medical care at your doctor's office, retail clinic, urgent care center, or emergency room.  If you have a medical emergency, please immediately call 911 or go to the emergency department.  Pager Numbers  - Dr. Nehemiah Massed: [phone]  - Dr. Laurence Ferrari: [phone]  - Dr. Nicole Kindred: [phone]  In the event of inclement weather, please call our main line at [phone] for an update on the status of any delays or closures.  Dermatology Medication Tips: Please keep the boxes  that topical medications come in in order to help keep track of the instructions about where and how to use these. Pharmacies typically print the medication instructions only on the boxes and not directly on the medication tubes.   If your medication is too expensive, please contact our office at [phone] option 4 or send [place] a message through [place].   We are unable to tell what your co-pay for medications will be in advance as this is different depending on your insurance coverage. However, we may be able to find a substitute medication at lower cost or fill out paperwork to get insurance to cover a needed medication.   If a prior authorization is required to get your medication covered by your insurance company, please allow [place] 1-2 business days to complete this process.  Drug prices often vary depending on where the prescription is filled and some pharmacies may offer cheaper prices.  The website www.goodrx.com contains coupons for medications through different pharmacies. The prices here do not account for what the cost may be with help from insurance (it may be cheaper with your insurance), but the website can give you the price if you did not use any insurance.  - You can print the associated coupon and take it with your prescription to the pharmacy.  - You may also stop by our office during regular business hours and pick up a GoodRx coupon card.  - If you need your prescription sent electronically to a different pharmacy,  notify our office through [facility] or by phone at [phone] option 4.

## 2021-05-18 NOTE — Progress Notes (Signed)
Follow-Up Visit   Subjective  Angela Bentley is a 80 y.o. female who presents for the following: Follow-up (Patient here today for 2 month AK and wart follow up. Areas treated at last appointment with LN2. Patient advises she has not noticed any new spots but she is continuing to pick at the wart at chin. ).  Patient has open wound from a fall at right lower leg. She is taking doxycycline for cellulitis.   The following portions of the chart were reviewed this encounter and updated as appropriate:   Tobacco  Allergies  Meds  Problems  Med Hx  Surg Hx  Fam Hx      Review of Systems:  No other skin or systemic complaints except as noted in HPI or Assessment and Plan.  Objective  Well appearing patient in no apparent distress; mood and affect are within normal limits.  A focused examination was performed including face, ears, legs. Relevant physical exam findings are noted in the Assessment and Plan.  left cheek x 1, right preauricular x 1 (2) Erythematous thin papules/macules with gritty scale.   right chin Verrucous papules -- Discussed viral etiology and contagion.   Right pretibia Open wound with mild ill defined erythema extending distally to ankle    Assessment & Plan  AK (actinic keratosis) (2) left cheek x 1, right preauricular x 1  Hypertrophic at right preauricular, biopsy at follow up if indicated  Prior to procedure, discussed risks of blister formation, small wound, skin dyspigmentation, or rare scar following cryotherapy. Recommend Vaseline ointment to treated areas while healing.   Destruction of lesion - left cheek x 1, right preauricular x 1  Destruction method: cryotherapy   Informed consent: discussed and consent obtained   Lesion destroyed using liquid nitrogen: Yes   Cryotherapy cycles:  2 Outcome: patient tolerated procedure well with no complications   Post-procedure details: wound care instructions given    Other viral warts right  chin  Discussed viral etiology and risk of spread.  Discussed multiple treatments may be required to clear warts.  Discussed possible post-treatment dyspigmentation and risk of recurrence.  Prior to procedure, discussed risks of blister formation, small wound, skin dyspigmentation, or rare scar following cryotherapy. Recommend Vaseline ointment to treated areas while healing.  Consider treating with cantharidin in future  If not cleared in 2 weeks start thin layer of imiquimod to affected area at bedtime.   Destruction of lesion - right chin  Destruction method: cryotherapy   Informed consent: discussed and consent obtained   Lesion destroyed using liquid nitrogen: Yes   Cryotherapy cycles:  2 Outcome: patient tolerated procedure well with no complications   Post-procedure details: wound care instructions given    imiquimod (ALDARA) 5 % cream - right chin Apply topically at bedtime.  Cellulitis of right lower extremity Right pretibia  D/c doxycycline due to failure to improve and spreading despite therapy Unfortunately she is allergic to penicillin and sulfa  Start clindamycin '600mg'$  TID x 7 days. Reviewed risk of C.difficile, a serious infection of the gut requiring prompt therapy  Recommend yogurt or prebiotic/probiotic daily x 3 months. Monitor for any development of diarrhea and get evaluated for C. Difficile if she develops any   mupirocin ointment (BACTROBAN) 2 % - Right pretibia Apply 1 application topically daily. With dressing changes  clindamycin (CLEOCIN) 300 MG capsule - Right pretibia Take 2 capsules (600 mg total) by mouth 3 (three) times daily for 7 days.  Related Procedures Anaerobic and  Aerobic Culture  Return in about 2 months (around 07/18/2021) for AK follow up, Wart.  Graciella Belton, RMA, am acting as scribe for Forest Gleason, MD .  Documentation: I have reviewed the above documentation for accuracy and completeness, and I agree with the  above.  Forest Gleason, MD

## 2021-05-22 ENCOUNTER — Encounter: Payer: Self-pay | Admitting: Dermatology

## 2021-05-26 ENCOUNTER — Telehealth: Payer: Self-pay

## 2021-05-26 LAB — ANAEROBIC AND AEROBIC CULTURE

## 2021-05-26 NOTE — Telephone Encounter (Signed)
Spoke with patient and advised her that if it is not painful and she does not see yellow-colored pus, ok to give this longer to heal. Legs take longer to heal and she should continue to use mupirocin daily and cover with band-aid. Advised patient to call if any changes.Cherly Hensen

## 2021-05-26 NOTE — Telephone Encounter (Signed)
Spoke with patient and advised her culture results did grow staph. Patient advises it looks like infection has cleared but the wound does ooze clear when she removed band-aid and that the bottom area of wound is white. It does not hurt. She would like to know if you would like to see it in clinic./js

## 2021-05-26 NOTE — Telephone Encounter (Signed)
-----   Message from [place], MD sent at 05/26/2021  1:20 PM EDT ----- Culture grew Staph aureus resistant to doxycycline, sensitive to clindamycin (which we put her on after failure to respond to doxycycline), so hopefully her cellulitis is cleared up.  MAs please call to be sure patient is doing better. Let me know if she has any concerns. Thank you!    Cc Dr. [name]

## 2021-05-26 NOTE — Telephone Encounter (Signed)
Thank you!  If it is not painful and she does not see yellow-colored pus, ok to give this longer to heal. Usually the drainage will get less with time. Legs always take awhile to heal. Recommend continuing mupirocin ointment at least daily until completely healed. If she is concerned, however, I am happy to see her in clinic.

## 2021-06-17 DIAGNOSIS — Z23 Encounter for immunization: Secondary | ICD-10-CM | POA: Diagnosis not present

## 2021-07-08 DIAGNOSIS — Z23 Encounter for immunization: Secondary | ICD-10-CM | POA: Diagnosis not present

## 2021-07-11 IMAGING — DX DG HIP (WITH OR WITHOUT PELVIS) 2-3V*L*
3 series · 3 of 3 positions shown · non-contrast
Comparison: None.

CLINICAL DATA: Left lateral to anterior hip pain.

EXAM:
DG HIP (WITH OR WITHOUT PELVIS) 2-3V LEFT

[pelvis ap]
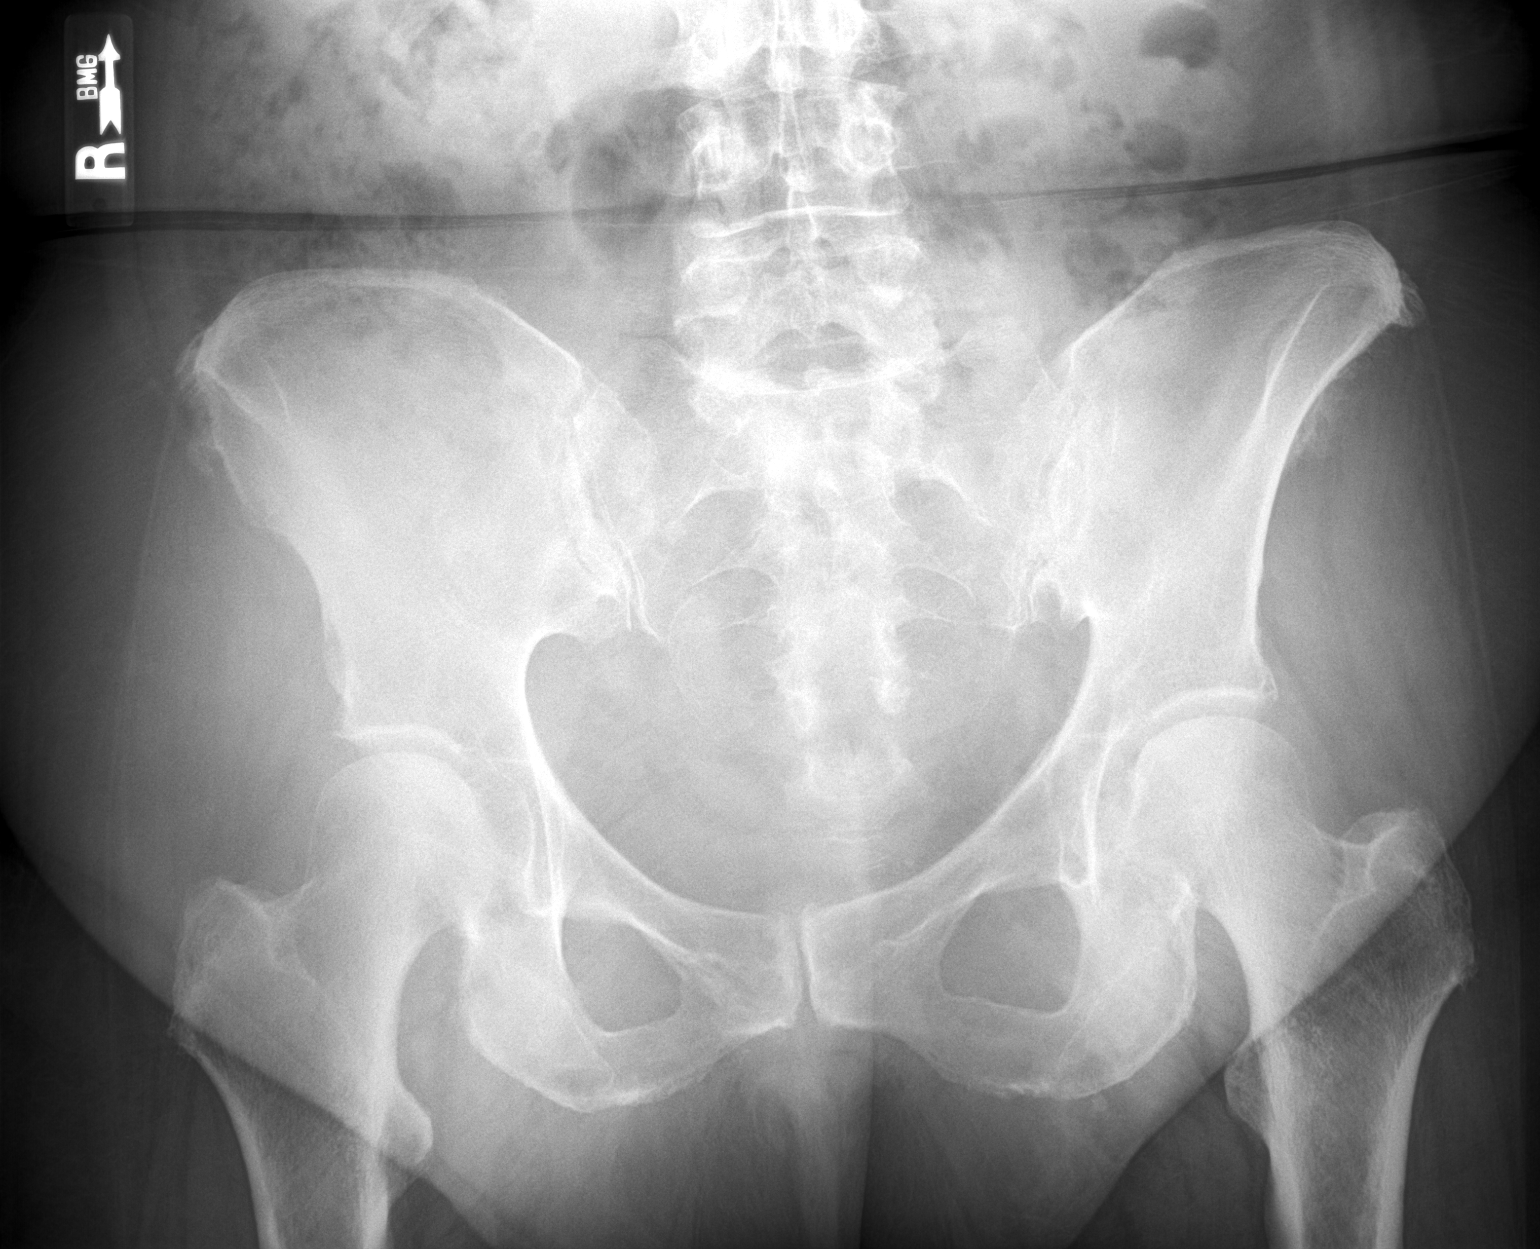

[hip ap]
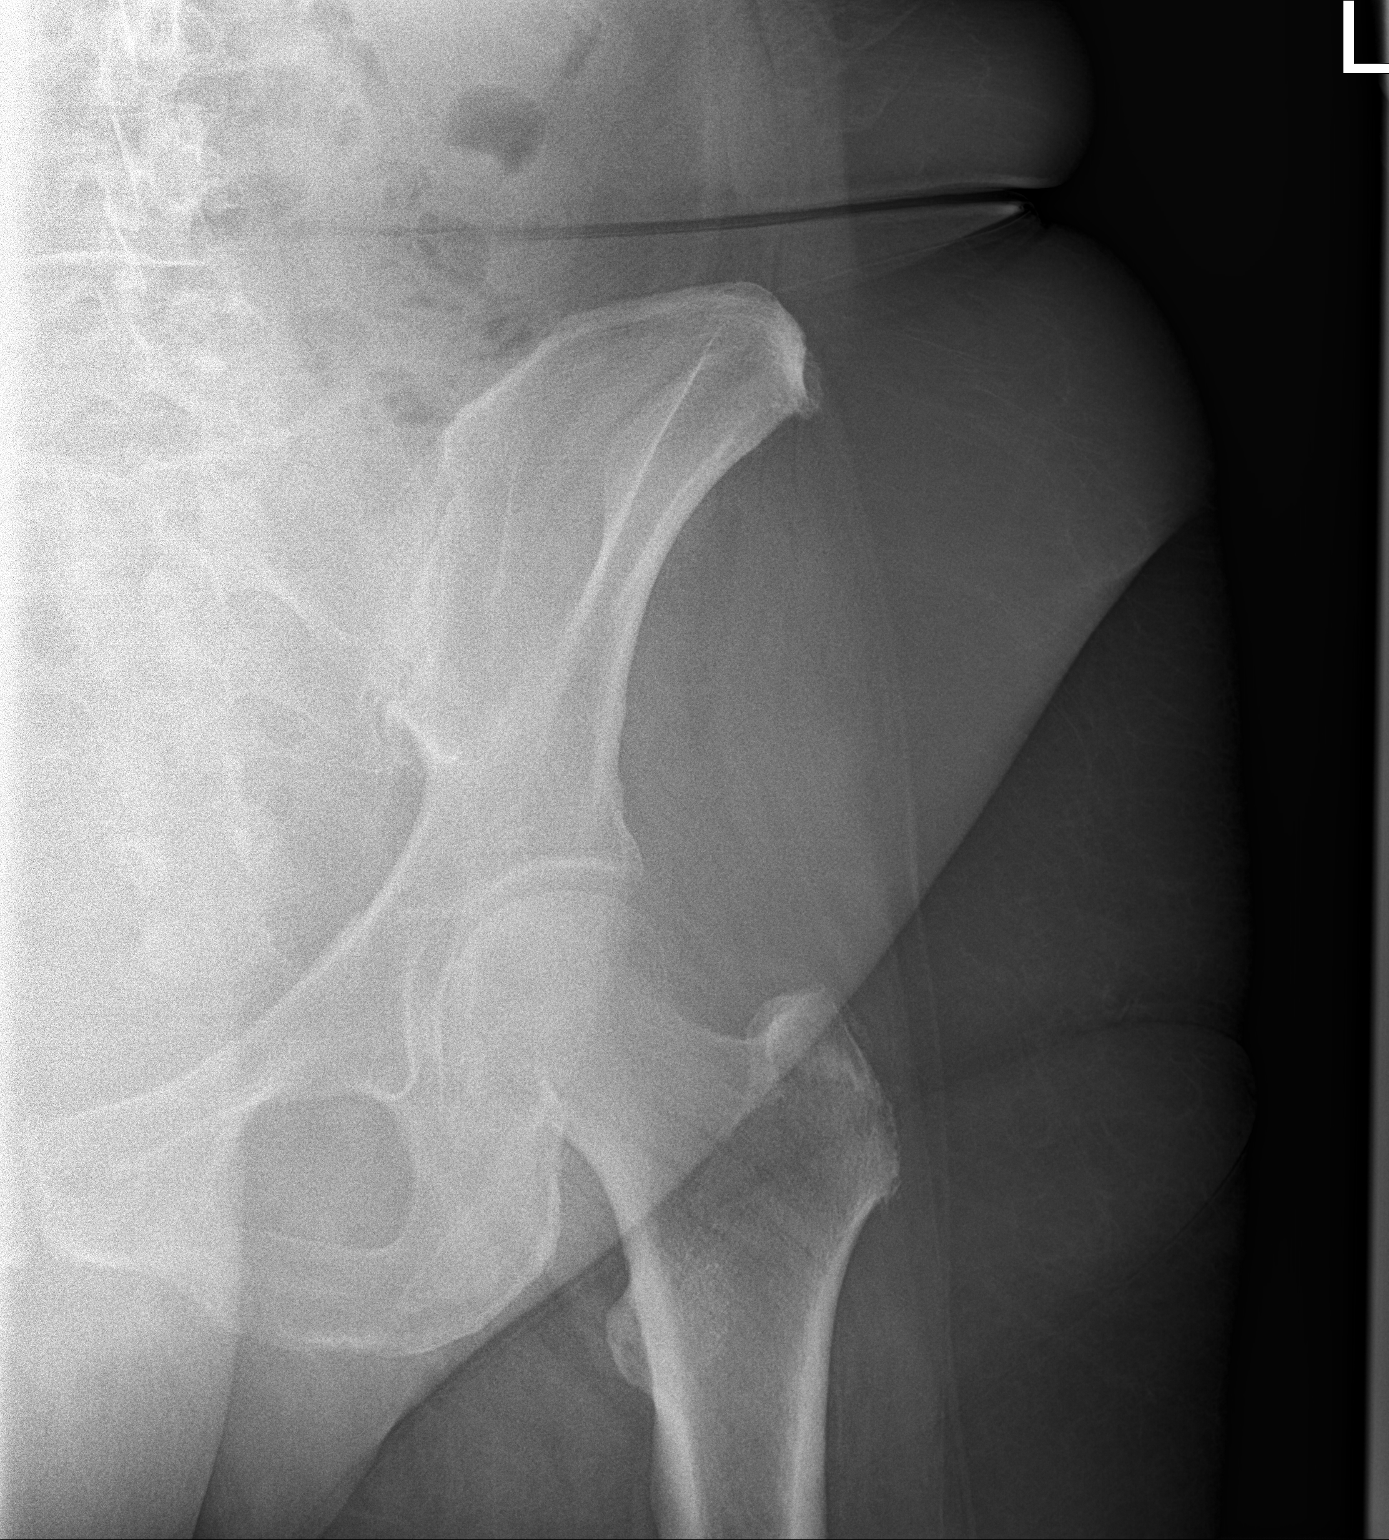

[hip lat]
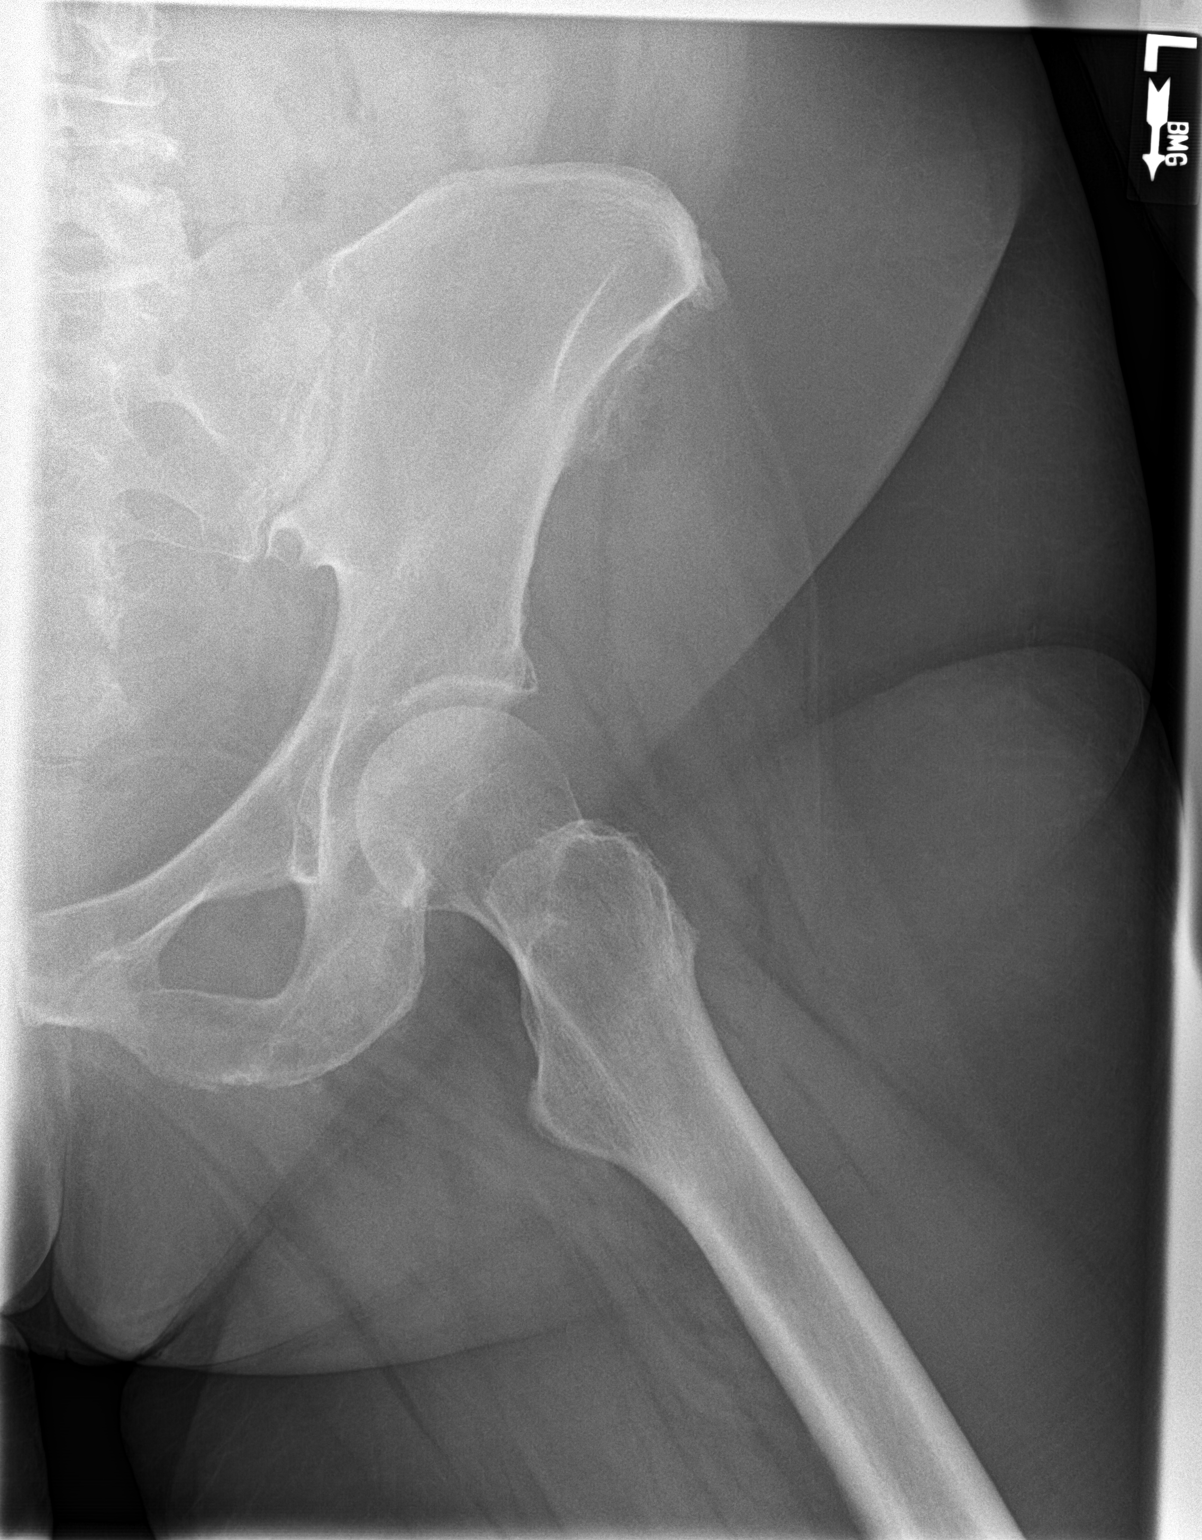

[3 of 3 positions shown; findings below may reference images not displayed]

FINDINGS: No degenerative hip narrowing or spurring. No evidence of fracture
or bone lesion. Symmetric enthesophytes. Negative soft tissues
IMPRESSION: No degenerative hip narrowing or spurring.

## 2021-07-15 ENCOUNTER — Ambulatory Visit (INDEPENDENT_AMBULATORY_CARE_PROVIDER_SITE_OTHER): Payer: Medicare Other | Admitting: Dermatology

## 2021-07-15 ENCOUNTER — Other Ambulatory Visit: Payer: Self-pay

## 2021-07-15 ENCOUNTER — Encounter: Payer: Self-pay | Admitting: Dermatology

## 2021-07-15 DIAGNOSIS — B078 Other viral warts: Secondary | ICD-10-CM

## 2021-07-15 DIAGNOSIS — Z872 Personal history of diseases of the skin and subcutaneous tissue: Secondary | ICD-10-CM

## 2021-07-15 NOTE — Patient Instructions (Addendum)
Cryotherapy Aftercare  Wash gently with soap and water everyday.   Apply Vaseline and Band-Aid daily until healed.   Prior to procedure, discussed risks of blister formation, small wound, skin dyspigmentation, or rare scar following cryotherapy. Recommend Vaseline ointment to treated areas while healing.  Recommend daily broad spectrum sunscreen SPF 30+ to sun-exposed areas, reapply every 2 hours as needed. Call for new or changing lesions.  Staying in the shade or wearing long sleeves, sun glasses (UVA+UVB protection) and wide brim hats (4-inch brim around the entire circumference of the hat) are also recommended for sun protection.   If you have any questions or concerns for your doctor, please call our main line at [phone] and press option 4 to reach your doctor's medical assistant. If no one answers, please leave a voicemail as directed and we will return your call as soon as possible. Messages left after 4 pm will be answered the following business day.   You may also send us a message via MyChart. We typically respond to MyChart messages within 1-2 business days.  For prescription refills, please ask your pharmacy to contact our office. Our fax number is [phone].  If you have an urgent issue when the clinic is closed that cannot wait until the next business day, you can page your doctor at the number below.    Please note that while we do our best to be available for urgent issues outside of office hours, we are not available 24/7.   If you have an urgent issue and are unable to reach us, you may choose to seek medical care at your doctor's office, retail clinic, urgent care center, or emergency room.  If you have a medical emergency, please immediately call 911 or go to the emergency department.  Pager Numbers  - Dr. Kowalski: [phone]  - Dr. Moye: [phone]  - Dr. Stewart: [phone]  In the event of inclement weather, please call our main line at  [phone] for an update on the status of any delays or closures.  Dermatology Medication Tips: Please keep the boxes that topical medications come in in order to help keep track of the instructions about where and how to use these. Pharmacies typically print the medication instructions only on the boxes and not directly on the medication tubes.   If your medication is too expensive, please contact our office at [phone] option 4 or send us a message through MyChart.   We are unable to tell what your co-pay for medications will be in advance as this is different depending on your insurance coverage. However, we may be able to find a substitute medication at lower cost or fill out paperwork to get insurance to cover a needed medication.   If a prior authorization is required to get your medication covered by your insurance company, please allow us 1-2 business days to complete this process.  Drug prices often vary depending on where the prescription is filled and some pharmacies may offer cheaper prices.  The website www.goodrx.com contains coupons for medications through different pharmacies. The prices here do not account for what the cost may be with help from insurance (it may be cheaper with your insurance), but the website can give you the price if you did not use any insurance.  - You can print the associated coupon and take it with your prescription to the pharmacy.  - You may also stop by our office during regular business hours and pick up a GoodRx coupon card.  -   If you need your prescription sent electronically to a different pharmacy, notify our office through [facility] MyChart or by phone at [phone] option 4.  

## 2021-07-15 NOTE — Progress Notes (Signed)
   Follow-Up Visit   Subjective  Angela Bentley is a 80 y.o. female who presents for the following: Follow-up (Patient here today for 2 month wart and AK follow up. Wart at right chin treated with LN2 and imiquimod. AK's at right chin and left preauricular treated with LN2.).  No new or changing spots per patient.   The following portions of the chart were reviewed this encounter and updated as appropriate:   Tobacco  Allergies  Meds  Problems  Med Hx  Surg Hx  Fam Hx      Review of Systems:  No other skin or systemic complaints except as noted in HPI or Assessment and Plan.  Objective  Well appearing patient in no apparent distress; mood and affect are within normal limits.  A focused examination was performed including face. Relevant physical exam findings are noted in the Assessment and Plan.  Right Chin Verrucous papule   Assessment & Plan  Other viral warts Right Chin  Discussed viral etiology and risk of spread.  Discussed multiple treatments may be required to clear warts.  Discussed possible post-treatment dyspigmentation and risk of recurrence.  Continue imiquimod increasing to twice daily (she currently has no irritation, itch or pinkness)  Prior to procedure, discussed risks of blister formation, small wound, skin dyspigmentation, or rare scar following cryotherapy. Recommend Vaseline ointment to treated areas while healing.  Patient defers squaric acid, cantharone plus, or 5FU/salicylic acid at this time  Destruction of lesion - Right Chin  Destruction method: cryotherapy   Informed consent: discussed and consent obtained   Lesion destroyed using liquid nitrogen: Yes   Cryotherapy cycles:  2 Outcome: patient tolerated procedure well with no complications   Post-procedure details: wound care instructions given    Related Medications imiquimod (ALDARA) 5 % cream Apply topically at bedtime.  History of PreCancerous Actinic Keratosis  - site(s) of  PreCancerous Actinic Keratosis clear today. - these may recur and new lesions may form requiring treatment to prevent transformation into skin cancer - observe for new or changing spots and contact [place] for appointment if occur - photoprotection with sun protective clothing; sunglasses and broad spectrum sunscreen with SPF of at least 30 + and frequent self skin exams recommended - yearly exams by a dermatologist recommended for persons with history of PreCancerous Actinic Keratoses  Return in about 4 weeks (around 08/12/2021) for Wart.  Graciella Belton, RMA, am acting as scribe for Forest Gleason, MD .  Documentation: I have reviewed the above documentation for accuracy and completeness, and I agree with the above.  Forest Gleason, MD

## 2021-07-22 ENCOUNTER — Ambulatory Visit (INDEPENDENT_AMBULATORY_CARE_PROVIDER_SITE_OTHER): Payer: Medicare Other | Admitting: Internal Medicine

## 2021-07-22 ENCOUNTER — Other Ambulatory Visit: Payer: Self-pay

## 2021-07-22 ENCOUNTER — Encounter: Payer: Self-pay | Admitting: Internal Medicine

## 2021-07-22 DIAGNOSIS — D329 Benign neoplasm of meninges, unspecified: Secondary | ICD-10-CM | POA: Diagnosis not present

## 2021-07-22 DIAGNOSIS — H5319 Other subjective visual disturbances: Secondary | ICD-10-CM | POA: Insufficient documentation

## 2021-07-22 NOTE — Assessment & Plan Note (Signed)
Not amaurosis and doesn't sound like a TIA Brief---and some headache associated with it--but she doesn't really remember it Could be aura with migraine (never had before) Unclear if the gamma knife could have caused that (in addition to mild cognitive changes) Don't think MRI or carotid checks indicated Should have eyes checked by her ophthalmologist If recurs, check sugar (but doubt hypoglycemia)

## 2021-07-22 NOTE — Progress Notes (Signed)
Subjective:    Patient ID: Angela Bentley, female    DOB: 08/20/1941, 80 y.o.   MRN: [id number]  HPI Here with husband due to visual symptoms This visit occurred during the SARS-CoV-2 public health emergency.  Safety protocols were in place, including screening questions prior to the visit, additional usage of staff PPE, and extensive cleaning of exam room while observing appropriate contact time as indicated for disinfecting solutions.   Awoke to check the thermometer 2AM today--walked to the thermostat and suddenly couldn't see right Doesn't use the right eye much---out of good left eye she saw light on top and dark on the bottom No change in limited right eye vision (mostly peripheral) Husband remembers her telling him she had a bad headache She was frightened and doesn't really remember much Went back to bed and the vision came back to normal----?15 minutes  No palpitations No chest pain No SOB  Current Outpatient Medications on File Prior to Visit  Medication Sig Dispense Refill   diclofenac Sodium (VOLTAREN) 1 % GEL Apply topically 4 (four) times daily as needed.     fluticasone (VERAMYST) 27.5 MCG/SPRAY nasal spray Place 2 sprays into the nose daily as needed for allergies.      glimepiride (AMARYL) 2 MG tablet Take 1 tablet (2 mg total) by mouth daily before breakfast. 90 tablet 3   HYDROcodone bit-homatropine (HYCODAN) 5-1.5 MG/5ML syrup Take 5 mLs by mouth at bedtime as needed for cough.     imiquimod (ALDARA) 5 % cream Apply topically at bedtime. 12 each 0   losartan-hydrochlorothiazide (HYZAAR) 100-12.5 MG tablet Take 1 tablet by mouth daily. 90 tablet 3   metoprolol succinate (TOPROL-XL) 25 MG 24 hr tablet Take 1 tablet (25 mg total) by mouth daily. 90 tablet 3   Multiple Vitamin (MULTIVITAMIN) tablet Take 1 tablet by mouth daily.     mupirocin ointment (BACTROBAN) 2 % Apply 1 application topically daily. With dressing changes 22 g 0   pioglitazone (ACTOS) 45 MG tablet  Take 1 tablet (45 mg total) by mouth daily. 90 tablet 3   rivaroxaban (XARELTO) 20 MG TABS tablet TAKE 1 TABLET (20 MG TOTAL) BY MOUTH DAILY WITH SUPPER. 90 tablet 3   No current facility-administered medications on file prior to visit.    Allergies  Allergen Reactions   Penicillins     Has patient had a PCN reaction causing immediate rash, facial/tongue/throat swelling, SOB or lightheadedness with hypotension: Yes Has patient had a PCN reaction causing severe rash involving mucus membranes or skin necrosis: Yes Has patient had a PCN reaction that required hospitalization No Has patient had a PCN reaction occurring within the last 10 years: No If all of the above answers are "NO", then may proceed with Cephalosporin use.     Sulfa Antibiotics     Rash/itching   Azithromycin Palpitations    Past Medical History:  Diagnosis Date   Actinic keratosis 06/25/2020   L pretibia distal - bx proven    Balance problems    BCC (basal cell carcinoma of skin) 12/17/2020   right temple   Brain tumor ([place])    a. Left intraventricular tumor ->stable by 06/2017 MRI. Followed @ [facility].   Dysplastic nevus 06/25/2020   R flank - moderate   Gait disturbance    a. Unsteady on feet/balance difficulty.   Generalized osteoarthritis of multiple sites    GERD (gastroesophageal reflux disease)    History of DVT (deep vein thrombosis) 2016   estrogen and  airflights. Rx with xarelto for 3 months   History of stress test    a. 11/2016 ETT: No ST/T changes.  Performed to assess PAC/PVC burden with activity ->No ectopy noted.   Hypertension    Hyperthyroidism    treated briefly in college   Insomnia    Obesity    PAF (paroxysmal atrial fibrillation) ([place])    a. 12/2016 Event Monitor: brief run of PAF-->CHA2DS2VASc = 5--> Xarelto.   Plantar fasciitis    Retinal tear of left eye    Rosacea    Squamous cell carcinoma of skin 09/07/2017   R dorsum of hand    Type 2 diabetes, controlled, with peripheral  neuropathy ([place])    a. 11/2016 A1c 7.4.   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   RETINAL TEAR REPAIR CRYOTHERAPY     10/09/13, then again 3/15    Family History  Problem Relation Age of Onset   Diabetes Father    Pancreatic cancer Father    Diabetes Other        grandmother    Social History   Socioeconomic History   Marital status: Married    Spouse name: Not on file   Number of children: 3   Years of education: Not on file   Highest education level: Not on file  Occupational History   Occupation: Non profit management    Comment: [employer] and others  Tobacco Use   Smoking status: Former    Types: Cigarettes    Quit date: 09/26/1993    Years since quitting: 27.8   Smokeless tobacco: Never   Tobacco comments:    quit in 1996  Vaping Use   Vaping Use: Never used  Substance and Sexual Activity   Alcohol use: Yes    Alcohol/week: 0.0 - 1.0 standard drinks   Drug use: No   Sexual activity: Not Currently    Partners: Male  Other Topics Concern   Not on file  Social History Narrative   Has living will   Husband is health care POA-- or daughter Izora Gala   Would accept resuscitation attempts   Would not want tube feeds if cognitively unaware   Social Determinants of Health   Financial Resource Strain: Low Risk    Difficulty of Paying Living Expenses: Not very hard  Food Insecurity: Not on file  Transportation Needs: Not on file  Physical Activity: Not on file  Stress: Not on file  Social Connections: Not on file  Intimate Partner Violence: Not on file   Review of Systems No prior migraines No N/V Didn't check sugar---no prior hypoglycemia at night Husband notes memory issues since gamma knife therapy 1 year ago    Objective:   Physical Exam Constitutional:       Appearance: Normal appearance.  Eyes:     Extraocular Movements: Extraocular movements intact.     Pupils: Pupils are equal, round, and reactive to light.     Comments: Disc on left seems sharp--hard to see on left  Cardiovascular:     Rate and Rhythm: Normal rate and regular rhythm.     Heart sounds: No murmur heard.   No gallop.  Pulmonary:     Effort: Pulmonary effort is normal.     Breath sounds: Normal breath sounds.  No wheezing or rales.  Musculoskeletal:     Cervical back: Neck supple.     Right lower leg: No edema.     Left lower leg: No edema.  Lymphadenopathy:     Cervical: No cervical adenopathy.  Neurological:     Mental Status: She is alert and oriented to person, place, and time.     Cranial Nerves: Cranial nerves 2-12 are intact.     Motor: No weakness, tremor or abnormal muscle tone.     Coordination: Romberg sign negative. Finger-Nose-Finger Test normal.     Gait: Gait is intact.           Assessment & Plan:

## 2021-07-22 NOTE — Assessment & Plan Note (Signed)
Had the gamma knife done 11/21 Follow up MRI did show reduced size in meningioma and some edema It is possible that this could be related

## 2021-07-23 DIAGNOSIS — R519 Headache, unspecified: Secondary | ICD-10-CM

## 2021-07-23 DIAGNOSIS — D329 Benign neoplasm of meninges, unspecified: Secondary | ICD-10-CM

## 2021-07-27 DIAGNOSIS — H31002 Unspecified chorioretinal scars, left eye: Secondary | ICD-10-CM | POA: Diagnosis not present

## 2021-07-27 DIAGNOSIS — R519 Headache, unspecified: Secondary | ICD-10-CM | POA: Diagnosis not present

## 2021-07-28 DIAGNOSIS — D329 Benign neoplasm of meninges, unspecified: Secondary | ICD-10-CM | POA: Diagnosis not present

## 2021-07-28 DIAGNOSIS — Z9889 Other specified postprocedural states: Secondary | ICD-10-CM | POA: Diagnosis not present

## 2021-07-28 DIAGNOSIS — H5712 Ocular pain, left eye: Secondary | ICD-10-CM | POA: Diagnosis not present

## 2021-07-30 NOTE — Telephone Encounter (Signed)
Has been canceled

## 2021-08-03 DIAGNOSIS — M1711 Unilateral primary osteoarthritis, right knee: Secondary | ICD-10-CM | POA: Diagnosis not present

## 2021-08-03 DIAGNOSIS — I82409 Acute embolism and thrombosis of unspecified deep veins of unspecified lower extremity: Secondary | ICD-10-CM | POA: Diagnosis not present

## 2021-08-04 ENCOUNTER — Other Ambulatory Visit: Payer: Self-pay | Admitting: Internal Medicine

## 2021-08-09 DIAGNOSIS — M1711 Unilateral primary osteoarthritis, right knee: Secondary | ICD-10-CM | POA: Diagnosis not present

## 2021-08-12 ENCOUNTER — Ambulatory Visit (INDEPENDENT_AMBULATORY_CARE_PROVIDER_SITE_OTHER): Payer: Medicare Other | Admitting: Dermatology

## 2021-08-12 ENCOUNTER — Other Ambulatory Visit: Payer: Self-pay

## 2021-08-12 DIAGNOSIS — B078 Other viral warts: Secondary | ICD-10-CM | POA: Diagnosis not present

## 2021-08-12 DIAGNOSIS — L821 Other seborrheic keratosis: Secondary | ICD-10-CM | POA: Diagnosis not present

## 2021-08-12 DIAGNOSIS — C4492 Squamous cell carcinoma of skin, unspecified: Secondary | ICD-10-CM

## 2021-08-12 DIAGNOSIS — C44529 Squamous cell carcinoma of skin of other part of trunk: Secondary | ICD-10-CM

## 2021-08-12 DIAGNOSIS — L578 Other skin changes due to chronic exposure to nonionizing radiation: Secondary | ICD-10-CM | POA: Diagnosis not present

## 2021-08-12 DIAGNOSIS — D485 Neoplasm of uncertain behavior of skin: Secondary | ICD-10-CM

## 2021-08-12 HISTORY — DX: Squamous cell carcinoma of skin, unspecified: C44.92

## 2021-08-12 NOTE — Progress Notes (Signed)
Follow-Up Visit   Subjective  Angela Bentley is a 80 y.o. female who presents for the following: Follow-up (Patient here today for 4 week wart follow up at right chin. Wart treated prior with LN2. Patient advises the wart had gotten smaller but has since gotten larger. She is using imiquimod twice daily at home. ) and Skin Problem (Patient has a spot at left chest that she thinks was a pimple. ).   The following portions of the chart were reviewed this encounter and updated as appropriate:   Tobacco  Allergies  Meds  Problems  Med Hx  Surg Hx  Fam Hx      Review of Systems:  No other skin or systemic complaints except as noted in HPI or Assessment and Plan.  Objective  Well appearing patient in no apparent distress; mood and affect are within normal limits.  A focused examination was performed including face and chest. Relevant physical exam findings are noted in the Assessment and Plan.  Left chest 0.5 cm crusted exophytic papule R/o SCC vs strangulated acrochordon vs verruca     right chin 0.5 cm verrucous papule with central punctate vessels      Assessment & Plan  Neoplasm of uncertain behavior of skin Left chest  Epidermal / dermal shaving  Lesion diameter (cm):  0.5 Informed consent: discussed and consent obtained   Timeout: patient name, date of birth, surgical site, and procedure verified   Patient was prepped and draped in usual sterile fashion: area prepped with isopropyl alcohol. Anesthesia: the lesion was anesthetized in a standard fashion   Anesthetic:  1% lidocaine w/ epinephrine 1-100,000 buffered w/ 8.4% NaHCO3 Instrument used: flexible razor blade   Hemostasis achieved with: aluminum chloride   Outcome: patient tolerated procedure well   Post-procedure details: wound care instructions given   Additional details:  Mupirocin and a bandage applied  Specimen 1 - Surgical pathology Differential Diagnosis: R/o SCC vs strangulated acrochordon vs  verruca  Check Margins: No 0.5 cm crusted exophytic papule   Other viral warts right chin  Discussed viral etiology and risk of spread.  Discussed multiple treatments may be required to clear warts.  Discussed possible post-treatment dyspigmentation and risk of recurrence.  Squaric Acid 3% applied to warts today. Prior to application reviewed risk of inflammation and irritation.  Squaric Acid 3% applied to left upper inner arm and covered with band aid. Patient has topical steroid available to treat topically if rash occurs.   Cantharidin Plus is a blistering agent that comes from a beetle.  It needs to be washed off in about 4 hours after application.  Although it is painless when applied in office, it may cause symptoms of mild pain and burning several hours later.  Treated areas will swell and turn red, and blisters may form.  Vaseline and a bandaid may be applied until wound has healed.  Once healed, the skin may remain temporarily discolored.  It can take weeks to months for pigmentation to return to normal.  Advised to wash off with soap and water in 2 hours or sooner if it becomes tender before then.  Consider biopsy if not improved on follow up.   Destruction of lesion - right chin  Destruction method: cryotherapy   Informed consent: discussed and consent obtained   Lesion destroyed using liquid nitrogen: Yes   Cryotherapy cycles:  2 Outcome: patient tolerated procedure well with no complications   Post-procedure details: wound care instructions given    Destruction  of lesion - right chin  Destruction method: chemical removal   Informed consent: discussed and consent obtained   Timeout:  patient name, date of birth, surgical site, and procedure verified Chemical destruction method: cantharidin   Chemical destruction method comment:  Cantharidin plus Application time:  2 hours Procedure instructions: patient instructed to wash and dry area   Outcome: patient tolerated  procedure well with no complications   Post-procedure details: wound care instructions given    Related Medications imiquimod (ALDARA) 5 % cream Apply topically at bedtime.  Seborrheic Keratoses - Stuck-on, waxy, tan-brown papules and/or plaques  - Benign-appearing - Discussed benign etiology and prognosis. - Observe - Call for any changes  Actinic Damage - chronic, secondary to cumulative UV radiation exposure/sun exposure over time - diffuse scaly erythematous macules with underlying dyspigmentation - Recommend daily broad spectrum sunscreen SPF 30+ to sun-exposed areas, reapply every 2 hours as needed.  - Recommend staying in the shade or wearing long sleeves, sun glasses (UVA+UVB protection) and wide brim hats (4-inch brim around the entire circumference of the hat). - Call for new or changing lesions.  Return for Wart, as scheduled.  Graciella Belton, RMA, am acting as scribe for Forest Gleason, MD .  Documentation: I have reviewed the above documentation for accuracy and completeness, and I agree with the above.  Forest Gleason, MD

## 2021-08-12 NOTE — Patient Instructions (Addendum)
Wound Care Instructions  Cleanse wound gently with soap and water once a day then pat dry with clean gauze. Apply a thing coat of Petrolatum (petroleum jelly, "Vaseline") over the wound (unless you have an allergy to this). We recommend that you use a new, sterile tube of Vaseline. Do not pick or remove scabs. Do not remove the yellow or white "healing tissue" from the base of the wound.  Cover the wound with fresh, clean, nonstick gauze and secure with paper tape. You may use Band-Aids in place of gauze and tape if the would is small enough, but would recommend trimming much of the tape off as there is often too much. Sometimes Band-Aids can irritate the skin.  You should call the office for your biopsy report after 1 week if you have not already been contacted.  If you experience any problems, such as abnormal amounts of bleeding, swelling, significant bruising, significant pain, or evidence of infection, please call the office immediately.  FOR ADULT SURGERY PATIENTS: If you need something for pain relief you may take 1 extra strength Tylenol (acetaminophen) AND 2 Ibuprofen (200mg  each) together every 4 hours as needed for pain. (do not take these if you are allergic to them or if you have a reason you should not take them.) Typically, you may only need pain medication for 1 to 3 days.   Instructions for After In-Office Application of Cantharidin  1. This is a strong medicine; please follow ALL instructions.  2. Gently wash off with soap and water in two hours or sooner as directed by your physician.  3. **WARNING** this medicine can cause severe blistering, blood blisters, infection, and/or scarring if it is not washed off as directed.  4. Your progress will be rechecked in 1-2 months; call sooner if there are any questions or problems.  Cantharidin Plus is a blistering agent that comes from a beetle.  It needs to be washed off in about 2 hours after application.  Although it is painless  when applied in office, it may cause symptoms of mild pain and burning several hours later.  Treated areas will swell and turn red, and blisters may form.  Vaseline and a bandaid may be applied until wound has healed.  Once healed, the skin may remain temporarily discolored.  It can take weeks to months for pigmentation to return to normal.  Advised to wash off with soap and water in 4 hours or sooner if it becomes tender before then.  If You Need Anything After Your Visit  If you have any questions or concerns for your doctor, please call our main line at [phone] and press option 4 to reach your doctor's medical assistant. If no one answers, please leave a voicemail as directed and we will return your call as soon as possible. Messages left after 4 pm will be answered the following business day.   You may also send [place] a message via [place]. We typically respond to MyChart messages within 1-2 business days.  For prescription refills, please ask your pharmacy to contact our office. Our fax number is [phone].  If you have an urgent issue when the clinic is closed that cannot wait until the next business day, you can page your doctor at the number below.    Please note that while we do our best to be available for urgent issues outside of office hours, we are not available 24/7.   If you have an urgent issue and are unable to  reach [place], you may choose to seek medical care at your doctor's office, retail clinic, urgent care center, or emergency room.  If you have a medical emergency, please immediately call 911 or go to the emergency department.  Pager Numbers  - Dr. Nehemiah Massed: [phone]  - Dr. Laurence Ferrari: [phone]  - Dr. Nicole Kindred: [phone]  In the event of inclement weather, please call our main line at [phone] for an update on the status of any delays or closures.  Dermatology Medication Tips: Please keep the boxes that topical medications come in in order to help keep  track of the instructions about where and how to use these. Pharmacies typically print the medication instructions only on the boxes and not directly on the medication tubes.   If your medication is too expensive, please contact our office at [phone] option 4 or send [place] a message through [place].   We are unable to tell what your co-pay for medications will be in advance as this is different depending on your insurance coverage. However, we may be able to find a substitute medication at lower cost or fill out paperwork to get insurance to cover a needed medication.   If a prior authorization is required to get your medication covered by your insurance company, please allow [place] 1-2 business days to complete this process.  Drug prices often vary depending on where the prescription is filled and some pharmacies may offer cheaper prices.  The website www.goodrx.com contains coupons for medications through different pharmacies. The prices here do not account for what the cost may be with help from insurance (it may be cheaper with your insurance), but the website can give you the price if you did not use any insurance.  - You can print the associated coupon and take it with your prescription to the pharmacy.  - You may also stop by our office during regular business hours and pick up a GoodRx coupon card.  - If you need your prescription sent electronically to a different pharmacy, notify our office through [facility] or by phone at [phone] option 4.     Si Usted Necesita Algo Despus de Su Visita  Tambin puede enviarnos un mensaje a travs de Pharmacist, community. Por lo general respondemos a los mensajes de MyChart en el transcurso de 1 a 2 das hbiles.  Para renovar recetas, por favor pida a su farmacia que se ponga en contacto con nuestra oficina. [place] de fax es [place] [phone].  Si tiene un asunto urgente cuando la clnica est cerrada y que no puede esperar hasta el  siguiente da hbil, puede llamar/localizar a su doctor(a) al nmero que aparece a continuacin.   Por favor, tenga en cuenta que aunque hacemos todo lo posible para estar disponibles para asuntos urgentes fuera del horario de [place], no estamos disponibles las 24 horas del da, los 7 das de la [place].   Si tiene un problema urgente y no puede comunicarse con nosotros, puede optar por buscar atencin mdica  en el consultorio de su doctor(a), en una clnica privada, en un centro de atencin urgente o en una sala de emergencias.  Si tiene Engineering geologist, por favor llame inmediatamente al 911 o vaya a la sala de emergencias.  Nmeros de bper  - Dr. Nehemiah Massed: [phone]  - Dra. Moye: [phone]  - Dra. Nicole Kindred: [phone]  En caso de inclemencias del [place], por favor llame a Johnsie Kindred principal al [phone] para una actualizacin sobre el [place] de cualquier  retraso o cierre.  Consejos para la medicacin en dermatologa: Por favor, guarde las cajas en las que vienen los medicamentos de uso tpico para ayudarle a seguir las instrucciones sobre dnde y cmo usarlos. Las farmacias generalmente imprimen las instrucciones del medicamento slo en las cajas y no directamente en los tubos del [place].   Si su medicamento es muy caro, por favor, pngase en contacto con Zigmund Daniel llamando al [phone] y presione la opcin 4 o envenos un mensaje a travs de Pharmacist, community.   No podemos decirle cul ser su copago por los medicamentos por adelantado ya que esto es diferente dependiendo de la cobertura de su seguro. Sin embargo, es posible que podamos encontrar un medicamento sustituto a Electrical engineer un formulario para que el seguro cubra el medicamento que se considera necesario.   Si se requiere una autorizacin previa para que su compaa de seguros [place] su medicamento, por favor permtanos de 1 a 2 das hbiles para completar este proceso.  Los precios de los  medicamentos varan con frecuencia dependiendo del Environmental consultant de dnde se surte la receta y alguna farmacias pueden ofrecer precios ms baratos.  El sitio web www.goodrx.com tiene cupones para medicamentos de Airline pilot. Los precios aqu no tienen en cuenta lo que podra costar con la ayuda del seguro (puede ser ms barato con su seguro), pero el sitio web puede darle el precio si no utiliz Research scientist (physical sciences).  - Puede imprimir el cupn correspondiente y llevarlo con su receta a la farmacia.  - Tambin puede pasar por nuestra oficina durante el horario de atencin regular y Charity fundraiser una tarjeta de cupones de GoodRx.  - Si necesita que su receta se enve electrnicamente a una farmacia diferente, informe a nuestra oficina a travs de MyChart de [place] o por telfono llamando al [phone] y presione la opcin 4.

## 2021-08-23 ENCOUNTER — Other Ambulatory Visit: Payer: Self-pay | Admitting: Internal Medicine

## 2021-08-24 ENCOUNTER — Encounter: Payer: Self-pay | Admitting: Dermatology

## 2021-08-31 ENCOUNTER — Telehealth: Payer: Self-pay

## 2021-08-31 NOTE — Telephone Encounter (Signed)
Patient advised bx results showed SCC, scheduled for [facility] 09/09/21 at 10AM./js

## 2021-09-09 ENCOUNTER — Ambulatory Visit: Payer: Medicare Other | Admitting: Dermatology

## 2021-09-10 ENCOUNTER — Telehealth: Payer: Self-pay

## 2021-09-10 NOTE — Telephone Encounter (Signed)
I left a message on patient's voicemail to return my call. Dr. Moye will be out of the office next week and we need to reschedule her appointment.  °

## 2021-09-14 ENCOUNTER — Ambulatory Visit: Payer: Medicare Other | Admitting: Dermatology

## 2021-09-14 DIAGNOSIS — M1711 Unilateral primary osteoarthritis, right knee: Secondary | ICD-10-CM | POA: Diagnosis not present

## 2021-09-28 ENCOUNTER — Ambulatory Visit: Payer: Medicare Other | Admitting: Dermatology

## 2021-10-08 DIAGNOSIS — M21061 Valgus deformity, not elsewhere classified, right knee: Secondary | ICD-10-CM | POA: Diagnosis not present

## 2021-10-08 DIAGNOSIS — E1142 Type 2 diabetes mellitus with diabetic polyneuropathy: Secondary | ICD-10-CM | POA: Diagnosis not present

## 2021-10-08 DIAGNOSIS — M1711 Unilateral primary osteoarthritis, right knee: Secondary | ICD-10-CM | POA: Diagnosis not present

## 2021-10-08 DIAGNOSIS — I82409 Acute embolism and thrombosis of unspecified deep veins of unspecified lower extremity: Secondary | ICD-10-CM | POA: Diagnosis not present

## 2021-10-25 NOTE — Progress Notes (Deleted)
Subjective:   Angela Bentley is a 81 y.o. female who presents for Medicare Annual (Subsequent) preventive examination.  I connected with Angela Bentley today by telephone and verified that I am speaking with the correct person using two identifiers. Location patient: home Location provider: work Persons participating in the virtual visit: patient, Marine scientist.    I discussed the limitations, risks, security and privacy concerns of performing an evaluation and management service by telephone and the availability of in person appointments. I also discussed with the patient that there may be a patient responsible charge related to this service. The patient expressed understanding and verbally consented to this telephonic visit.    Interactive audio and video telecommunications were attempted between this provider and patient, however failed, due to patient having technical difficulties OR patient did not have access to video capability.  We continued and completed visit with audio only.  Some vital signs may be absent or patient reported.   Time Spent with patient on telephone encounter: *** minutes  Review of Systems           Objective:    There were no vitals filed for this visit. There is no height or weight on file to calculate BMI.  Advanced Directives 08/24/2020 12/23/2019 03/24/2019 03/19/2019 10/20/2018 08/14/2018 06/14/2017  Does Patient Have a Medical Advance Directive? Yes Yes No;Yes Yes Yes Yes Yes  Type of Paramedic of [place];Living will [place];Living will [place];Living will [place];Living will [place];Living will [place];Living will [place];Living will  Does patient want to make changes to medical advance directive? No - Patient declined No - Patient declined No - Patient declined No - Patient declined - No - Patient declined No -  Patient declined  Copy of [place] in Chart? No - copy requested No - copy requested No - copy requested No - copy requested - No - copy requested No - copy requested  Would patient like information on creating a medical advance directive? - - No - Patient declined - - - -    Current Medications (verified) Outpatient Encounter Medications as of 10/27/2021  Medication Sig   diclofenac Sodium (VOLTAREN) 1 % GEL Apply topically 4 (four) times daily as needed.   fluticasone (VERAMYST) 27.5 MCG/SPRAY nasal spray Place 2 sprays into the nose daily as needed for allergies.    glimepiride (AMARYL) 2 MG tablet Take 1 tablet (2 mg total) by mouth daily before breakfast.   HYDROcodone bit-homatropine (HYCODAN) 5-1.5 MG/5ML syrup Take 5 mLs by mouth at bedtime as needed for cough.   imiquimod (ALDARA) 5 % cream Apply topically at bedtime.   losartan-hydrochlorothiazide (HYZAAR) 100-12.5 MG tablet TAKE 1 TABLET EVERY DAY   metoprolol succinate (TOPROL-XL) 25 MG 24 hr tablet TAKE 1 TABLET (25 MG TOTAL) BY MOUTH DAILY.   Multiple Vitamin (MULTIVITAMIN) tablet Take 1 tablet by mouth daily.   mupirocin ointment (BACTROBAN) 2 % Apply 1 application topically daily. With dressing changes   pioglitazone (ACTOS) 45 MG tablet TAKE 1 TABLET EVERY DAY   XARELTO 20 MG TABS tablet TAKE 1 TABLET (20 MG TOTAL) BY MOUTH DAILY WITH SUPPER.   No facility-administered encounter medications on file as of 10/27/2021.    Allergies (verified) Penicillins, Sulfa antibiotics, and Azithromycin   History: Past Medical History:  Diagnosis Date   Actinic keratosis 06/25/2020   L pretibia distal - bx proven  Balance problems    BCC (basal cell carcinoma of skin) 12/17/2020   right temple, Moh's 01/19/2021   Brain tumor ([place])    a. Left intraventricular tumor ->stable by 06/2017 MRI. Followed @ [facility].   Dysplastic nevus 06/25/2020   R flank - moderate   Gait disturbance    a. Unsteady on feet/balance  difficulty.   Generalized osteoarthritis of multiple sites    GERD (gastroesophageal reflux disease)    History of DVT (deep vein thrombosis) 2016   estrogen and airflights. Rx with xarelto for 3 months   History of stress test    a. 11/2016 ETT: No ST/T changes.  Performed to assess PAC/PVC burden with activity ->No ectopy noted.   Hypertension    Hyperthyroidism    treated briefly in college   Insomnia    Obesity    PAF (paroxysmal atrial fibrillation) ([place])    a. 12/2016 Event Monitor: brief run of PAF-->CHA2DS2VASc = 5--> Xarelto.   Plantar fasciitis    Retinal tear of left eye    Rosacea    SCC (squamous cell carcinoma) 08/12/2021   left chest   Squamous cell carcinoma of skin 09/07/2017   R dorsum of hand    Type 2 diabetes, controlled, with peripheral neuropathy ([place])    a. 11/2016 A1c 7.4.   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   RETINAL TEAR REPAIR CRYOTHERAPY     10/09/13, then again 3/15   Family History  Problem Relation Age of Onset   Diabetes Father    Pancreatic cancer Father    Diabetes Other        grandmother   Social History   Socioeconomic History   Marital status: Married    Spouse name: Not on file   Number of children: 3   Years of education: Not on file   Highest education level: Not on file  Occupational History   Occupation: Non profit management    Comment: [employer] and others  Tobacco Use   Smoking status: Former    Types: Cigarettes    Quit date: 09/26/1993    Years since quitting: 28.0   Smokeless tobacco: Never   Tobacco comments:    quit in 1996  Vaping Use   Vaping Use: Never used  Substance and Sexual Activity   Alcohol use: Yes    Alcohol/week: 0.0 - 1.0 standard drinks   Drug use: No   Sexual activity: Not Currently    Partners: Male   Other Topics Concern   Not on file  Social History Narrative   Has living will   Husband is health care POA-- or daughter Angela Bentley   Would accept resuscitation attempts   Would not want tube feeds if cognitively unaware   Social Determinants of Health   Financial Resource Strain: Low Risk    Difficulty of Paying Living Expenses: Not very hard  Food Insecurity: Not on file  Transportation Needs: Not on file  Physical Activity: Not on file  Stress: Not on file  Social Connections: Not on file    Tobacco Counseling Counseling given: Not Answered Tobacco comments: quit in 1996   Clinical Intake:                 Diabetes:  Is the patient diabetic?  Yes  If diabetic, was a CBG obtained today?  No  Did the patient bring in their glucometer from home?  No  How often do you monitor your CBG's? ***.   Financial Strains and Diabetes Management:  Are you having any financial strains with the device, your supplies or your medication? {YES/NO:21197}.  Does the patient want to be seen by Chronic Care Management for management of their diabetes?  {YES/NO:21197} Would the patient like to be referred to a Nutritionist or for Diabetic Management?  {YES/NO:21197}  Diabetic Exams:  Diabetic Eye Exam: Completed 02/24/21.  Diabetic Foot Exam: Completed 04/27/21.          Activities of Daily Living No flowsheet data found.  Patient Care Team: [name], MD as PCP - General (Internal Medicine) Minna Merritts, MD as PCP - Cardiology (Cardiology) [name], [facility] as Pharmacist (Pharmacist)  Indicate any recent Medical Services you may have received from other than [facility] providers in the past year (date may be approximate).     Assessment:   This is a routine wellness examination for Angela Bentley.  Hearing/Vision screen No results found.  Dietary issues and exercise activities discussed:     Goals Addressed   None    Depression Screen PHQ 2/9 Scores  10/14/2020 10/09/2019 10/09/2019 10/03/2018 09/21/2017 06/02/2016  PHQ - 2 Score 0 0 0 0 0 0    Fall Risk Fall Risk  10/14/2020 10/09/2019 10/09/2019 10/03/2018 09/21/2017  Falls in the past year? 0 0 0 0 No    FALL RISK PREVENTION PERTAINING TO THE HOME:  Any stairs in or around the home? {YES/NO:21197} If so, are there any without handrails? {YES/NO:21197} Home free of loose throw rugs in walkways, pet beds, electrical cords, etc? {YES/NO:21197} Adequate lighting in your home to reduce risk of falls? {YES/NO:21197}  ASSISTIVE DEVICES UTILIZED TO PREVENT FALLS:  Life alert? {YES/NO:21197} Use of a cane, walker or w/c? {YES/NO:21197} Grab bars in the bathroom? {YES/NO:21197} Shower chair or bench in shower? {YES/NO:21197} Elevated toilet seat or a handicapped toilet? {YES/NO:21197}  TIMED UP AND GO:  Was the test performed? No .    Cognitive Function:        Immunizations Immunization History  Administered Date(s) Administered   Influenza, High Dose Seasonal PF 07/09/2014, 07/23/2015, 06/02/2016, 07/02/2019   Influenza,inj,Quad PF,6+ Mos 06/23/2017, 07/17/2018   Influenza-Unspecified 07/23/2015, 06/22/2020, 07/08/2021   Moderna Sars-Covid-2 Vaccination 10/08/2019, 11/05/2019, 07/24/2020, 02/28/2021   Pfizer Covid-19 Vaccine Bivalent Booster 46yrs & up 06/17/2021   Pneumococcal Conjugate-13 01/30/2014   Pneumococcal Polysaccharide-23 09/13/2004, 11/03/2016   Tdap 10/23/2012   Zoster Recombinat (Shingrix) 11/19/2018, 04/11/2019   Zoster, Live 10/19/2006    TDAP status: Up to date  Flu Vaccine status: Up to date  Pneumococcal vaccine status: Up to date  Covid-19 vaccine status: Completed vaccines  Qualifies for Shingles Vaccine? Yes   Zostavax completed Yes   Shingrix Completed?: Yes  Screening Tests Health Maintenance  Topic Date Due   MAMMOGRAM  11/19/2017   HEMOGLOBIN A1C  10/28/2021   OPHTHALMOLOGY EXAM  02/24/2022   FOOT EXAM  04/27/2022   TETANUS/TDAP   10/23/2022   Pneumonia Vaccine 24+ Years old  Completed   INFLUENZA VACCINE  Completed   DEXA SCAN  Completed   COVID-19 Vaccine  Completed   Zoster Vaccines- Shingrix  Completed   HPV VACCINES  Aged Out    Health Maintenance  Health Maintenance Due  Topic Date Due   MAMMOGRAM  11/19/2017    Colorectal  cancer screening: No longer required.   {Mammogram status:21018020}  {Bone Density status:21018021}  Lung Cancer Screening: (Low Dose CT Chest recommended if Age 32-80 years, 30 pack-year currently smoking OR have quit w/in 15years.) does not qualify.     Additional Screening:  Hepatitis C Screening: does not qualify  Vision Screening: Recommended annual ophthalmology exams for early detection of glaucoma and other disorders of the eye. Is the patient up to date with their annual eye exam?  {YES/NO:21197} Who is the provider or what is the name of the office in which the patient attends annual eye exams? *** If pt is not established with a provider, would they like to be referred to a provider to establish care? {YES/NO:21197}.   Dental Screening: Recommended annual dental exams for proper oral hygiene  Community Resource Referral / Chronic Care Management: CRR required this visit?  {YES/NO:21197}  CCM required this visit?  {YES/NO:21197}     Plan:     I have personally reviewed and noted the following in the patients chart:   Medical and social history Use of alcohol, tobacco or illicit drugs  Current medications and supplements including opioid prescriptions.  Functional ability and status Nutritional status Physical activity Advanced directives List of other physicians Hospitalizations, surgeries, and ER visits in previous 12 months Vitals Screenings to include cognitive, depression, and falls Referrals and appointments  In addition, I have reviewed and discussed with patient certain preventive protocols, quality metrics, and best practice  recommendations. A written personalized care plan for preventive services as well as general preventive health recommendations were provided to patient.   Due to this being a telephonic visit, the after visit summary with patients personalized plan was offered to patient via mail or my-chart. ***Patient declined at this time./ Patient would like to access on my-chart/ per request, patient was mailed a copy of AVS./ Patient preferred to pick up at office at next visit.   Loma Messing, LPN   6/33/3545   Nurse Health Advisor  Nurse Notes: none

## 2021-10-27 ENCOUNTER — Ambulatory Visit: Payer: Medicare Other

## 2021-10-27 ENCOUNTER — Telehealth: Payer: Self-pay

## 2021-10-27 NOTE — Telephone Encounter (Signed)
Made several attempts to contact patient in regards to annual wellness telephone visit today @ 10:30am. Left vm messaging advising patient of appointment and to call office to reschedule appointment when available. TM

## 2021-10-29 ENCOUNTER — Other Ambulatory Visit: Payer: Self-pay

## 2021-10-29 ENCOUNTER — Ambulatory Visit (INDEPENDENT_AMBULATORY_CARE_PROVIDER_SITE_OTHER): Payer: Medicare Other | Admitting: Internal Medicine

## 2021-10-29 ENCOUNTER — Encounter: Payer: Self-pay | Admitting: Internal Medicine

## 2021-10-29 VITALS — BP 104/76 | HR 78 | Temp 97.3°F | Ht 65.25 in | Wt 202.0 lb

## 2021-10-29 DIAGNOSIS — R413 Other amnesia: Secondary | ICD-10-CM

## 2021-10-29 DIAGNOSIS — Z0000 Encounter for general adult medical examination without abnormal findings: Secondary | ICD-10-CM | POA: Diagnosis not present

## 2021-10-29 DIAGNOSIS — M1711 Unilateral primary osteoarthritis, right knee: Secondary | ICD-10-CM | POA: Diagnosis not present

## 2021-10-29 DIAGNOSIS — E1142 Type 2 diabetes mellitus with diabetic polyneuropathy: Secondary | ICD-10-CM

## 2021-10-29 DIAGNOSIS — I10 Essential (primary) hypertension: Secondary | ICD-10-CM | POA: Diagnosis not present

## 2021-10-29 DIAGNOSIS — I480 Paroxysmal atrial fibrillation: Secondary | ICD-10-CM | POA: Diagnosis not present

## 2021-10-29 DIAGNOSIS — D329 Benign neoplasm of meninges, unspecified: Secondary | ICD-10-CM | POA: Diagnosis not present

## 2021-10-29 LAB — HM DIABETES FOOT EXAM

## 2021-10-29 LAB — LIPID PANEL
Cholesterol: 171 mg/dL (ref 0–200)
HDL: 44.5 mg/dL (ref >39.00)
NonHDL: 126.95
Total CHOL/HDL Ratio: 4
Triglycerides: 210 mg/dL — ABNORMAL HIGH (ref 0.0–149.0)
VLDL: 42 mg/dL — ABNORMAL HIGH (ref 0.0–40.0)

## 2021-10-29 LAB — COMPREHENSIVE METABOLIC PANEL
ALT: 11 U/L (ref 0–35)
AST: 12 U/L (ref 0–37)
Albumin: 4.3 g/dL (ref 3.5–5.2)
Alkaline Phosphatase: 64 U/L (ref 39–117)
BUN: 18 mg/dL (ref 6–23)
CO2: 35 mEq/L — ABNORMAL HIGH (ref 19–32)
Calcium: 9.2 mg/dL (ref 8.4–10.5)
Chloride: 100 mEq/L (ref 96–112)
Creatinine, Ser: 0.87 mg/dL (ref 0.40–1.20)
GFR: 62.13 mL/min (ref >60.00)
Glucose, Bld: 198 mg/dL — ABNORMAL HIGH (ref 70–99)
Potassium: 3.8 mEq/L (ref 3.5–5.1)
Sodium: 138 mEq/L (ref 135–145)
Total Bilirubin: 0.4 mg/dL (ref 0.2–1.2)
Total Protein: 6.7 g/dL (ref 6.0–8.3)

## 2021-10-29 LAB — VITAMIN B12: Vitamin B-12: 203 pg/mL — ABNORMAL LOW (ref 211–911)

## 2021-10-29 LAB — CBC
HCT: 36.4 % (ref 36.0–46.0)
Hemoglobin: 12.2 g/dL (ref 12.0–15.0)
MCHC: 33.7 g/dL (ref 30.0–36.0)
MCV: 87 fl (ref 78.0–100.0)
Platelets: 218 10*3/uL (ref 150.0–400.0)
RBC: 4.18 Mil/uL (ref 3.87–5.11)
RDW: 13.9 % (ref 11.5–15.5)
WBC: 6.8 10*3/uL (ref 4.0–10.5)

## 2021-10-29 LAB — TSH: TSH: 2.25 u[IU]/mL (ref 0.35–5.50)

## 2021-10-29 LAB — MICROALBUMIN / CREATININE URINE RATIO
Creatinine,U: 139.1 mg/dL
Microalb Creat Ratio: 0.5 mg/g (ref 0.0–30.0)
Microalb, Ur: 0.8 mg/dL (ref 0.0–1.9)

## 2021-10-29 LAB — T4, FREE: Free T4: 0.9 ng/dL (ref 0.60–1.60)

## 2021-10-29 LAB — HEMOGLOBIN A1C: Hgb A1c MFr Bld: 8.1 % — ABNORMAL HIGH (ref 4.6–6.5)

## 2021-10-29 LAB — LDL CHOLESTEROL, DIRECT: Direct LDL: 104 mg/dL

## 2021-10-29 NOTE — Assessment & Plan Note (Signed)
Not checking much--but hopefully still okay Mild neuropathy Continues on actos 45, glimepiride 2mg 

## 2021-10-29 NOTE — Assessment & Plan Note (Signed)
I have personally reviewed the Medicare Annual Wellness questionnaire and have noted 1. The patient's medical and social history 2. Their use of alcohol, tobacco or illicit drugs 3. Their current medications and supplements 4. The patient's functional ability including ADL's, fall risks, home safety risks and hearing or visual             impairment. 5. Diet and physical activities 6. Evidence for depression or mood disorders  The patients weight, height, BMI and visual acuity have been recorded in the chart I have made referrals, counseling and provided education to the patient based review of the above and I have provided the pt with a written personalized care plan for preventive services.  I have provided you with a copy of your personalized plan for preventive services. Please take the time to review along with your updated medication list.  Done with cancer screening UTD on imms----flu vaccine in the fall Needs to work on fitness

## 2021-10-29 NOTE — Assessment & Plan Note (Signed)
Had the gamma knife procedure Trouble with memory since--should probably follow up Will check labs

## 2021-10-29 NOTE — Assessment & Plan Note (Signed)
BP Readings from Last 3 Encounters:  10/29/21 104/76  07/22/21 122/76  04/27/21 138/76   Good control on metoprolol and losartan HCTZ 100/12.5

## 2021-10-29 NOTE — Assessment & Plan Note (Signed)
Heading for TKR Really needs to work on quad strengthening Okay for surgery at this point--medically

## 2021-10-29 NOTE — Progress Notes (Signed)
Hearing Screening - Comments:: Passed whisper test Vision Screening - Comments:: Multiple exams in the last year.

## 2021-10-29 NOTE — Progress Notes (Signed)
Subjective:    Patient ID: Angela Bentley, female    DOB: [birth year], 81 y.o.   MRN: [id number]  HPI Here for Medicare wellness visit and follow up of chronic health conditions With husband Reviewed advanced directives Reviewed other doctors--Dr [facility], Dr Ardeen Gar[facility], Dr Eugenie Birks ---dentist No hospitalizations or surgery in past year Vision is fine Hearing is good Not exercising with her knee Rare alcohol--mostly wine No tobacco No falls No depression or anhedonia--disturbed by memory issues She and husband do shopping/instrumental ADLs  Walking with cane now--right knee worse Heading to TKR---Dr Hooten  Has noted some decline in memory Wonders if it is due to gamma knife Rx Has had follow up MRI for meningioma---was okay Husband does note some concerns  Sugars are okay--but not checking regularly Mild foot numbness --has worsened. No sig pain (some in left arch) ?slight swelling  No chest pain or SOB No palpitations recently--did have some years ago No dizziness or syncope No headaches  Current Outpatient Medications on File Prior to Visit  Medication Sig Dispense Refill   diclofenac Sodium (VOLTAREN) 1 % GEL Apply topically 4 (four) times daily as needed.     fluticasone (VERAMYST) 27.5 MCG/SPRAY nasal spray Place 2 sprays into the nose daily as needed for allergies.      glimepiride (AMARYL) 2 MG tablet Take 1 tablet (2 mg total) by mouth daily before breakfast. 90 tablet 3   losartan-hydrochlorothiazide (HYZAAR) 100-12.5 MG tablet TAKE 1 TABLET EVERY DAY 90 tablet 3   metoprolol succinate (TOPROL-XL) 25 MG 24 hr tablet TAKE 1 TABLET (25 MG TOTAL) BY MOUTH DAILY. 90 tablet 3   Multiple Vitamin (MULTIVITAMIN) tablet Take 1 tablet by mouth daily.     mupirocin ointment (BACTROBAN) 2 % Apply 1 application topically daily. With dressing changes 22 g 0   pioglitazone (ACTOS) 45 MG tablet TAKE 1 TABLET EVERY DAY 90 tablet 3   XARELTO 20  MG TABS tablet TAKE 1 TABLET (20 MG TOTAL) BY MOUTH DAILY WITH SUPPER. 90 tablet 3   No current facility-administered medications on file prior to visit.    Allergies  Allergen Reactions   Penicillins     Has patient had a PCN reaction causing immediate rash, facial/tongue/throat swelling, SOB or lightheadedness with hypotension: Yes Has patient had a PCN reaction causing severe rash involving mucus membranes or skin necrosis: Yes Has patient had a PCN reaction that required hospitalization No Has patient had a PCN reaction occurring within the last 10 years: No If all of the above answers are "NO", then may proceed with Cephalosporin use.     Sulfa Antibiotics     Rash/itching   Azithromycin Palpitations    Past Medical History:  Diagnosis Date   Actinic keratosis 06/25/2020   L pretibia distal - bx proven    Balance problems    BCC (basal cell carcinoma of skin) 12/17/2020   right temple, Moh's 01/19/2021   Brain tumor ([place])    a. Left intraventricular tumor ->stable by 06/2017 MRI. Followed @ [facility].   Dysplastic nevus 06/25/2020   R flank - moderate   Gait disturbance    a. Unsteady on feet/balance difficulty.   Generalized osteoarthritis of multiple sites    GERD (gastroesophageal reflux disease)    History of DVT (deep vein thrombosis) 2016   estrogen and airflights. Rx with xarelto for 3 months   History of stress test    a. 11/2016 ETT: No ST/T changes.  Performed to assess PAC/PVC  burden with activity ->No ectopy noted.   Hypertension    Hyperthyroidism    treated briefly in college   Insomnia    Obesity    PAF (paroxysmal atrial fibrillation) ([place])    a. 12/2016 Event Monitor: brief run of PAF-->CHA2DS2VASc = 5--> Xarelto.   Plantar fasciitis    Retinal tear of left eye    Rosacea    SCC (squamous cell carcinoma) 08/12/2021   left chest   Squamous cell carcinoma of skin 09/07/2017   R dorsum of hand    Type 2 diabetes, controlled, with peripheral neuropathy  ([place])    a. 11/2016 A1c 7.4.   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   RETINAL TEAR REPAIR CRYOTHERAPY     10/09/13, then again 3/15    Family History  Problem Relation Age of Onset   Diabetes Father    Pancreatic cancer Father    Diabetes Other        grandmother    Social History   Socioeconomic History   Marital status: Married    Spouse name: Not on file   Number of children: 3   Years of education: Not on file   Highest education level: Not on file  Occupational History   Occupation: Non profit management    Comment: [employer] and others  Tobacco Use   Smoking status: Former    Types: Cigarettes    Quit date: 09/26/1993    Years since quitting: 28.1   Smokeless tobacco: Never   Tobacco comments:    quit in 1996  Vaping Use   Vaping Use: Never used  Substance and Sexual Activity   Alcohol use: Yes    Alcohol/week: 0.0 - 1.0 standard drinks   Drug use: No   Sexual activity: Not Currently    Partners: Male  Other Topics Concern   Not on file  Social History Narrative   Has living will   Husband is health care POA-- or daughter Izora Gala   Would accept resuscitation attempts   Would not want tube feeds if cognitively unaware   Social Determinants of Health   Financial Resource Strain: Low Risk    Difficulty of Paying Living Expenses: Not very hard  Food Insecurity: Not on file  Transportation Needs: Not on file  Physical Activity: Not on file  Stress: Not on file  Social Connections: Not on file  Intimate Partner Violence: Not on file   Review of Systems Appetite is okay Weight is stable Sleeps fairly well--up several times and occ bad night Teeth okay--keeps up with dentist Wears seat belt No heartburn or dysphagia Bowels are fine--no blood Voids fine---bothered by  nocturia. No incontinence Some right shoulder pain---temporary. Mostly just the right knee No suspicious skin lesions    Objective:   Physical Exam Constitutional:      Appearance: Normal appearance.  HENT:     Mouth/Throat:     Comments: No oral lesions Eyes:     Conjunctiva/sclera: Conjunctivae normal.     Pupils: Pupils are equal, round, and reactive to light.  Cardiovascular:     Rate and Rhythm: Normal rate and regular rhythm.     Heart sounds: No murmur heard.   No gallop.     Comments: Faint pedal pulses Pulmonary:  Effort: Pulmonary effort is normal.     Breath sounds: Normal breath sounds. No wheezing or rales.  Abdominal:     Palpations: Abdomen is soft.     Tenderness: There is no abdominal tenderness.  Musculoskeletal:     Cervical back: Neck supple.     Right lower leg: No edema.     Left lower leg: No edema.  Lymphadenopathy:     Cervical: No cervical adenopathy.  Skin:    Findings: No lesion or rash.     Comments: No foot lesions  Neurological:     Mental Status: She is alert and oriented to person, place, and time.     Comments: Mini Cog okay---clock fine, memory 2/3  Mild decreased sensation laterally in feet  Psychiatric:        Mood and Affect: Mood normal.        Behavior: Behavior normal.           Assessment & Plan:

## 2021-10-29 NOTE — Assessment & Plan Note (Signed)
No symptoms on metoprolol 25 daily xarelto 20 daily

## 2021-11-15 ENCOUNTER — Telehealth: Payer: Self-pay | Admitting: Internal Medicine

## 2021-11-15 NOTE — Telephone Encounter (Signed)
Spoke to pt. After some investigating, I figured out it was her lab results from her recent [place]. I told her how to get on them in [place]. I also readu them to her, again.

## 2021-11-15 NOTE — Telephone Encounter (Signed)
Pt called she said that there was some papers given to her at her last appt about what she needs to be doing and following. She wasn't sure of the papers but wanted another copy sent

## 2021-11-17 ENCOUNTER — Encounter: Payer: Self-pay | Admitting: Internal Medicine

## 2021-11-18 DIAGNOSIS — M1711 Unilateral primary osteoarthritis, right knee: Secondary | ICD-10-CM | POA: Diagnosis not present

## 2021-11-21 DIAGNOSIS — I10 Essential (primary) hypertension: Secondary | ICD-10-CM | POA: Insufficient documentation

## 2021-11-21 DIAGNOSIS — E1142 Type 2 diabetes mellitus with diabetic polyneuropathy: Secondary | ICD-10-CM | POA: Insufficient documentation

## 2021-11-29 ENCOUNTER — Telehealth: Payer: Self-pay | Admitting: Dermatology

## 2021-11-29 DIAGNOSIS — M1711 Unilateral primary osteoarthritis, right knee: Secondary | ICD-10-CM | POA: Diagnosis not present

## 2021-11-29 DIAGNOSIS — M6281 Muscle weakness (generalized): Secondary | ICD-10-CM | POA: Diagnosis not present

## 2021-11-29 DIAGNOSIS — M25561 Pain in right knee: Secondary | ICD-10-CM | POA: Diagnosis not present

## 2021-11-29 NOTE — Telephone Encounter (Signed)
Patient canceled appointment for treatment of SCC at her chest. Please call patient and advise her we strongly recommend treating this as there is a risk of SCC spreading other places in the body and becoming life threatening. ? ?If she declines treatment, we must document that we had this conversation with her. If she cannot be reached, please send a certified letter with this information. ? ?Thank you! ? ? ? ?Skin , left chest ?WELL DIFFERENTIATED SQUAMOUS CELL CARCINOMA, BASE INVOLVED --> excision vs ED&C. This is a smaller spot I think would likely do well with ED&C.  ?  ?ED&C cures the skin cancer approximately 85% of the time and leaves a round wound that heals with good wound care over a few weeks time, leaving a round scar at the area. Excision cures the skin cancer about 92% of the time. With an excision, the skin cancer along with an an area of normal skin around it is cut out, and then stitches are placed to help with healing. You have to avoid heavy lifting or vigorous exercise for 2 weeks after excision. Excision leaves a line scar once it heals.  ?

## 2021-12-02 DIAGNOSIS — M1711 Unilateral primary osteoarthritis, right knee: Secondary | ICD-10-CM | POA: Diagnosis not present

## 2021-12-02 DIAGNOSIS — M25561 Pain in right knee: Secondary | ICD-10-CM | POA: Diagnosis not present

## 2021-12-02 DIAGNOSIS — M6281 Muscle weakness (generalized): Secondary | ICD-10-CM | POA: Diagnosis not present

## 2021-12-05 ENCOUNTER — Other Ambulatory Visit: Payer: Self-pay | Admitting: Internal Medicine

## 2021-12-06 DIAGNOSIS — M6281 Muscle weakness (generalized): Secondary | ICD-10-CM | POA: Diagnosis not present

## 2021-12-06 DIAGNOSIS — M25561 Pain in right knee: Secondary | ICD-10-CM | POA: Diagnosis not present

## 2021-12-06 DIAGNOSIS — M1711 Unilateral primary osteoarthritis, right knee: Secondary | ICD-10-CM | POA: Diagnosis not present

## 2021-12-06 NOTE — Telephone Encounter (Signed)
Left message on voicemail to return my call.  

## 2021-12-08 DIAGNOSIS — M25561 Pain in right knee: Secondary | ICD-10-CM | POA: Diagnosis not present

## 2021-12-08 DIAGNOSIS — M6281 Muscle weakness (generalized): Secondary | ICD-10-CM | POA: Diagnosis not present

## 2021-12-08 DIAGNOSIS — M1711 Unilateral primary osteoarthritis, right knee: Secondary | ICD-10-CM | POA: Diagnosis not present

## 2022-01-28 ENCOUNTER — Ambulatory Visit: Payer: Medicare Other | Admitting: Internal Medicine

## 2022-02-01 ENCOUNTER — Encounter: Payer: Self-pay | Admitting: Internal Medicine

## 2022-02-01 ENCOUNTER — Ambulatory Visit (INDEPENDENT_AMBULATORY_CARE_PROVIDER_SITE_OTHER): Payer: Medicare Other | Admitting: Internal Medicine

## 2022-02-01 VITALS — BP 136/80 | HR 80 | Temp 97.1°F | Ht 65.25 in | Wt 198.0 lb

## 2022-02-01 DIAGNOSIS — E1142 Type 2 diabetes mellitus with diabetic polyneuropathy: Secondary | ICD-10-CM | POA: Diagnosis not present

## 2022-02-01 DIAGNOSIS — E538 Deficiency of other specified B group vitamins: Secondary | ICD-10-CM

## 2022-02-01 LAB — VITAMIN B12: Vitamin B-12: 592 pg/mL (ref 211–911)

## 2022-02-01 LAB — POCT GLYCOSYLATED HEMOGLOBIN (HGB A1C): Hemoglobin A1C: 7.3 % — AB (ref 4.0–5.6)

## 2022-02-01 MED ORDER — GLIMEPIRIDE 4 MG PO TABS: 4.0000 mg | ORAL_TABLET | Freq: Every day | ORAL | 3 refills | Status: DC

## 2022-02-01 NOTE — Patient Instructions (Signed)
Please make sure you are taking the B12 every day. ? ?You need to work on your leg strength---especially your quadriceps muscles (the front of your thighs). ?

## 2022-02-01 NOTE — Progress Notes (Signed)
? ?Subjective:  ? ? Patient ID: Angela Bentley, female    DOB: 23-Apr-1941, 81 y.o.   MRN: [id number] ? ?HPI ?Here with husband for follow up of diabetes ?Daughter Izora Gala via phone in [place] ? ?Has been checking sugars---136-212 fasting ?Is on the higher dose of glimepiride ? ?Thought she hadn't started the vitamin B12 ?But daughter notes that she did get B12 and start it ? ?Current Outpatient Medications on File Prior to Visit  ?Medication Sig Dispense Refill  ? diclofenac Sodium (VOLTAREN) 1 % GEL Apply topically 4 (four) times daily as needed.    ? fluticasone (VERAMYST) 27.5 MCG/SPRAY nasal spray Place 2 sprays into the nose daily as needed for allergies.     ? glimepiride (AMARYL) 2 MG tablet TAKE 1 TABLET EVERY DAY BEFORE BREAKFAST (Patient taking differently: 4 mg.) 90 tablet 3  ? losartan-hydrochlorothiazide (HYZAAR) 100-12.5 MG tablet TAKE 1 TABLET EVERY DAY 90 tablet 3  ? metoprolol succinate (TOPROL-XL) 25 MG 24 hr tablet TAKE 1 TABLET (25 MG TOTAL) BY MOUTH DAILY. 90 tablet 3  ? Multiple Vitamin (MULTIVITAMIN) tablet Take 1 tablet by mouth daily.    ? mupirocin ointment (BACTROBAN) 2 % Apply 1 application topically daily. With dressing changes 22 g 0  ? pioglitazone (ACTOS) 45 MG tablet TAKE 1 TABLET EVERY DAY 90 tablet 3  ? XARELTO 20 MG TABS tablet TAKE 1 TABLET (20 MG TOTAL) BY MOUTH DAILY WITH SUPPER. 90 tablet 3  ? ?No current facility-administered medications on file prior to visit.  ? ? ?Allergies  ?Allergen Reactions  ? Penicillins   ?  Has patient had a PCN reaction causing immediate rash, facial/tongue/throat swelling, SOB or lightheadedness with hypotension: Yes ?Has patient had a PCN reaction causing severe rash involving mucus membranes or skin necrosis: Yes ?Has patient had a PCN reaction that required hospitalization No ?Has patient had a PCN reaction occurring within the last 10 years: No ?If all of the above answers are "NO", then may proceed with Cephalosporin use. ? ?  ? Sulfa  Antibiotics   ?  Rash/itching  ? Azithromycin Palpitations  ? ? ?Past Medical History:  ?Diagnosis Date  ? Actinic keratosis 06/25/2020  ? L pretibia distal - bx proven   ? Balance problems   ? BCC (basal cell carcinoma of skin) 12/17/2020  ? right temple, Moh's 01/19/2021  ? Brain tumor ([place])   ? a. Left intraventricular tumor ->stable by 06/2017 MRI. Followed @ [facility].  ? Dysplastic nevus 06/25/2020  ? R flank - moderate  ? Gait disturbance   ? a. Unsteady on feet/balance difficulty.  ? Generalized osteoarthritis of multiple sites   ? GERD (gastroesophageal reflux disease)   ? History of DVT (deep vein thrombosis) 2016  ? estrogen and airflights. Rx with xarelto for 3 months  ? History of stress test   ? a. 11/2016 ETT: No ST/T changes.  Performed to assess PAC/PVC burden with activity ->No ectopy noted.  ? Hypertension   ? Hyperthyroidism   ? treated briefly in college  ? Insomnia   ? Obesity   ? PAF (paroxysmal atrial fibrillation) ([place])   ? a. 12/2016 Event Monitor: brief run of PAF-->CHA2DS2VASc = 5--> Xarelto.  ? Plantar fasciitis   ? Retinal tear of left eye   ? Rosacea   ? SCC (squamous cell carcinoma) 08/12/2021  ? left chest  ? Squamous cell carcinoma of skin 09/07/2017  ? R dorsum of hand   ? Type 2 diabetes, controlled, with  peripheral neuropathy ([place])   ? a. 11/2016 A1c 7.4.  ? Uterine fibroid   ? ? ?Past Surgical History:  ?Procedure Laterality Date  ? CARPAL TUNNEL RELEASE Right   ? Dr Burney Gauze  ? CATARACT EXTRACTION    ? Gamma knife  07/2020  ? 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 RETINAL TEAR REPAIR CRYOTHERAPY    ? 10/09/13, then again 3/15  ? ? ?Family History  ?Problem Relation Age of Onset  ? Diabetes Father   ? Pancreatic cancer Father   ? Diabetes Other   ?     grandmother  ? ? ?Social History  ? ?Socioeconomic History  ? Marital status: Married  ?  Spouse name: Not on file  ? Number of children: 3  ? Years of education: Not on file  ? Highest education level: Not on  file  ?Occupational History  ? Occupation: Non Patent attorney  ?  Comment: [employer] and others  ?Tobacco Use  ? Smoking status: Former  ?  Types: Cigarettes  ?  Quit date: 09/26/1993  ?  Years since quitting: 28.3  ? Smokeless tobacco: Never  ? Tobacco comments:  ?  quit in 1996  ?Vaping Use  ? Vaping Use: Never used  ?Substance and Sexual Activity  ? Alcohol use: Yes  ?  Alcohol/week: 0.0 - 1.0 standard drinks  ? Drug use: No  ? Sexual activity: Not Currently  ?  Partners: Male  ?Other Topics Concern  ? Not on file  ?Social History Narrative  ? Has living will  ? Husband is health care POA-- or daughter Izora Gala  ? Would accept resuscitation attempts  ? Would not want tube feeds if cognitively unaware  ? ?Social Determinants of Health  ? ?Financial Resource Strain: Low Risk   ? Difficulty of Paying Living Expenses: Not very hard  ?Food Insecurity: Not on file  ?Transportation Needs: Not on file  ?Physical Activity: Not on file  ?Stress: Not on file  ?Social Connections: Not on file  ?Intimate Partner Violence: Not on file  ? ?Review of Systems ?Has lost a few pounds ?Now having second thoughts about TKR---pain not that bad but not overly painful ? ?   ?Objective:  ? Physical Exam ?Constitutional:   ?   Appearance: Normal appearance.  ?Cardiovascular:  ?   Rate and Rhythm: Normal rate and regular rhythm.  ?   Pulses: Normal pulses.  ?   Heart sounds: No murmur heard. ?  No gallop.  ?Pulmonary:  ?   Effort: Pulmonary effort is normal.  ?   Breath sounds: Normal breath sounds. No wheezing or rales.  ?Musculoskeletal:  ?   Cervical back: Neck supple.  ?   Right lower leg: No edema.  ?   Left lower leg: No edema.  ?Lymphadenopathy:  ?   Cervical: No cervical adenopathy.  ?Skin: ?   Comments: No foot lesions  ?Neurological:  ?   Mental Status: She is alert.  ?Psychiatric:     ?   Mood and Affect: Mood normal.     ?   Behavior: Behavior normal.  ?  ? ? ? ? ?   ?Assessment & Plan:  ? ?

## 2022-02-01 NOTE — Assessment & Plan Note (Signed)
Doing better with the glimepiride '4mg'$  and still actos '45mg'$  ?Lab Results  ?Component Value Date  ? HGBA1C 7.3 (A) 02/01/2022  ? ?Will continue current medications now ?

## 2022-02-01 NOTE — Assessment & Plan Note (Signed)
She was wondering about if she was taking this--but probably is ?Will recheck the level ?

## 2022-02-22 DIAGNOSIS — Z23 Encounter for immunization: Secondary | ICD-10-CM | POA: Diagnosis not present

## 2022-03-01 DIAGNOSIS — H52203 Unspecified astigmatism, bilateral: Secondary | ICD-10-CM | POA: Diagnosis not present

## 2022-03-01 DIAGNOSIS — E119 Type 2 diabetes mellitus without complications: Secondary | ICD-10-CM | POA: Diagnosis not present

## 2022-03-01 DIAGNOSIS — H353112 Nonexudative age-related macular degeneration, right eye, intermediate dry stage: Secondary | ICD-10-CM | POA: Diagnosis not present

## 2022-03-01 DIAGNOSIS — H18593 Other hereditary corneal dystrophies, bilateral: Secondary | ICD-10-CM | POA: Diagnosis not present

## 2022-03-01 LAB — HM DIABETES EYE EXAM

## 2022-03-03 ENCOUNTER — Telehealth: Payer: Self-pay | Admitting: Cardiovascular Disease

## 2022-03-03 NOTE — Telephone Encounter (Signed)
Did not need this encounter °

## 2022-03-08 ENCOUNTER — Ambulatory Visit: Payer: Medicare Other | Admitting: Cardiovascular Disease

## 2022-04-06 ENCOUNTER — Encounter: Payer: Self-pay | Admitting: Dermatology

## 2022-04-06 ENCOUNTER — Ambulatory Visit (INDEPENDENT_AMBULATORY_CARE_PROVIDER_SITE_OTHER): Payer: Medicare Other | Admitting: Dermatology

## 2022-04-06 DIAGNOSIS — C44529 Squamous cell carcinoma of skin of other part of trunk: Secondary | ICD-10-CM

## 2022-04-06 DIAGNOSIS — L821 Other seborrheic keratosis: Secondary | ICD-10-CM | POA: Diagnosis not present

## 2022-04-06 DIAGNOSIS — L814 Other melanin hyperpigmentation: Secondary | ICD-10-CM | POA: Diagnosis not present

## 2022-04-06 DIAGNOSIS — D229 Melanocytic nevi, unspecified: Secondary | ICD-10-CM | POA: Diagnosis not present

## 2022-04-06 DIAGNOSIS — C4492 Squamous cell carcinoma of skin, unspecified: Secondary | ICD-10-CM

## 2022-04-06 DIAGNOSIS — L578 Other skin changes due to chronic exposure to nonionizing radiation: Secondary | ICD-10-CM | POA: Diagnosis not present

## 2022-04-06 DIAGNOSIS — L570 Actinic keratosis: Secondary | ICD-10-CM | POA: Diagnosis not present

## 2022-04-06 DIAGNOSIS — Z1283 Encounter for screening for malignant neoplasm of skin: Secondary | ICD-10-CM

## 2022-04-06 DIAGNOSIS — D1800 Hemangioma unspecified site: Secondary | ICD-10-CM

## 2022-04-06 NOTE — Progress Notes (Signed)
Follow-Up Visit   Subjective  Angela Bentley is a 81 y.o. female who presents for the following: Skin Problem (Spots at chest she would like checked. She notices new spots and growing spots at the chest. She has an untreated SCC biopsied 08/13/21 at the chest.   The patient presents for Total-Body Skin Exam (TBSE) for skin cancer screening and mole check.  The patient has spots, moles and lesions to be evaluated, some may be new or changing and the patient has concerns that these could be cancer.  The following portions of the chart were reviewed this encounter and updated as appropriate:  Tobacco  Allergies  Meds  Problems  Med Hx  Surg Hx  Fam Hx      Review of Systems: No other skin or systemic complaints except as noted in HPI or Assessment and Plan.   Objective  Well appearing patient in no apparent distress; mood and affect are within normal limits.  A full examination was performed including scalp, head, eyes, ears, nose, lips, neck, chest, axillae, abdomen, back, buttocks, bilateral upper extremities, bilateral lower extremities, hands, feet, fingers, toes, fingernails, and toenails. All findings within normal limits unless otherwise noted below.  left chest 0.9 cm scaly pink papule   Left Upper Back x 1, right jaw x1 (2) Erythematous thin papules/macules with gritty scale.    Assessment & Plan  Squamous cell carcinoma of skin left chest  Destruction of lesion  Destruction method: electrodesiccation and curettage   Informed consent: discussed and consent obtained   Timeout:  patient name, date of birth, surgical site, and procedure verified Patient was prepped and draped in usual sterile fashion: area prepped with isopropyl alcohol. Anesthesia: the lesion was anesthetized in a standard fashion   Anesthetic:  1% lidocaine w/ epinephrine 1-100,000 buffered w/ 8.4% NaHCO3 Curettage performed in three different directions: Yes   Electrodesiccation performed over  the curetted area: Yes   Curettage cycles:  3 Final wound size (cm):  1.2 Hemostasis achieved with:  electrodesiccation Outcome: patient tolerated procedure well with no complications   Post-procedure details: wound care instructions given   Additional details:  Mupirocin and a pressure dressing applied  Bx proven scc    Discussed pathology results and treatment options  Patient prefers ED&C treatment today   Actinic keratosis (2) Left Upper Back x 1, right jaw x1  Actinic keratoses are precancerous spots that appear secondary to cumulative UV radiation exposure/sun exposure over time. They are chronic with expected duration over 1 year. A portion of actinic keratoses will progress to squamous cell carcinoma of the skin. It is not possible to reliably predict which spots will progress to skin cancer and so treatment is recommended to prevent development of skin cancer.  Recommend daily broad spectrum sunscreen SPF 30+ to sun-exposed areas, reapply every 2 hours as needed.  Recommend staying in the shade or wearing long sleeves, sun glasses (UVA+UVB protection) and wide brim hats (4-inch brim around the entire circumference of the hat). Call for new or changing lesions.  Destruction of lesion - Left Upper Back x 1, right jaw x1  Destruction method: cryotherapy   Informed consent: discussed and consent obtained   Lesion destroyed using liquid nitrogen: Yes   Cryotherapy cycles:  2 Outcome: patient tolerated procedure well with no complications   Post-procedure details: wound care instructions given   Additional details:  Prior to procedure, discussed risks of blister formation, small wound, skin dyspigmentation, or rare scar following cryotherapy. Recommend Vaseline  ointment to treated areas while healing.   Lentigines - Scattered tan macules - Due to sun exposure - Benign-appearing, observe - Recommend daily broad spectrum sunscreen SPF 30+ to sun-exposed areas, reapply every 2  hours as needed. - Call for any changes  Seborrheic Keratoses - Stuck-on, waxy, tan-brown papules and/or plaques  - Benign-appearing - Discussed benign etiology and prognosis. - Observe - Call for any changes  Melanocytic Nevi - Tan-brown and/or pink-flesh-colored symmetric macules and papules - Benign appearing on exam today - Observation - Call clinic for new or changing moles - Recommend daily use of broad spectrum spf 30+ sunscreen to sun-exposed areas.   Hemangiomas - Red papules - Discussed benign nature - Observe - Call for any changes  Actinic Damage - Chronic condition, secondary to cumulative UV/sun exposure - diffuse scaly erythematous macules with underlying dyspigmentation - Recommend daily broad spectrum sunscreen SPF 30+ to sun-exposed areas, reapply every 2 hours as needed.  - Staying in the shade or wearing long sleeves, sun glasses (UVA+UVB protection) and wide brim hats (4-inch brim around the entire circumference of the hat) are also recommended for sun protection.  - Call for new or changing lesions.  Skin cancer screening performed today.  Return for 3 month ak and recheck EDC , 6 month tbse . I, Ruthell Rummage, CMA, am acting as scribe for Forest Gleason, MD.  Documentation: I have reviewed the above documentation for accuracy and completeness, and I agree with the above.  Forest Gleason, MD

## 2022-04-06 NOTE — Patient Instructions (Addendum)
Recommend starting moisturizer with exfoliant (Urea, Salicylic acid, or Lactic acid) one to two times daily to help smooth rough and bumpy skin.  OTC options include Cetaphil Rough and Bumpy lotion (Urea), Eucerin Roughness Relief lotion or spot treatment cream (Urea), CeraVe SA lotion/cream for Rough and Bumpy skin (Sal Acid), Gold Bond Rough and Bumpy cream (Sal Acid), and AmLactin 12% lotion/cream (Lactic Acid).  If applying in morning, also apply sunscreen to sun-exposed areas, since these exfoliating moisturizers can increase sensitivity to sun.   Electrodesiccation and Curettage ("Scrape and Burn") Wound Care Instructions  Leave the original bandage on for 24 hours if possible.  If the bandage becomes soaked or soiled before that time, it is OK to remove it and examine the wound.  A small amount of post-operative bleeding is normal.  If excessive bleeding occurs, remove the bandage, place gauze over the site and apply continuous pressure (no peeking) over the area for 30 minutes. If this does not work, please call our clinic as soon as possible or page your doctor if it is after hours.   Once a day, cleanse the wound with soap and water. It is fine to shower. If a thick crust develops you may use a Q-tip dipped into dilute hydrogen peroxide (mix 1:1 with water) to dissolve it.  Hydrogen peroxide can slow the healing process, so use it only as needed.    After washing, apply petroleum jelly (Vaseline) or an antibiotic ointment if your doctor prescribed one for you, followed by a bandage.    For best healing, the wound should be covered with a layer of ointment at all times. If you are not able to keep the area covered with a bandage to hold the ointment in place, this may mean re-applying the ointment several times a day.  Continue this wound care until the wound has healed and is no longer open. It may take several weeks for the wound to heal and close.  Itching and mild discomfort is normal  during the healing process.  If you have any discomfort, you can take Tylenol (acetaminophen) or ibuprofen as directed on the bottle. (Please do not take these if you have an allergy to them or cannot take them for another reason).  Some redness, tenderness and white or yellow material in the wound is normal healing.  If the area becomes very sore and red, or develops a thick yellow-green material (pus), it may be infected; please notify us.    Wound healing continues for up to one year following surgery. It is not unusual to experience pain in the scar from time to time during the interval.  If the pain becomes severe or the scar thickens, you should notify the office.    A slight amount of redness in a scar is expected for the first six months.  After six months, the redness will fade and the scar will soften and fade.  The color difference becomes less noticeable with time.  If there are any problems, return for a post-op surgery check at your earliest convenience.  To improve the appearance of the scar, you can use silicone scar gel, cream, or sheets (such as Mederma or Serica) every night for up to one year. These are available over the counter (without a prescription).  Please call our office at [phone] for any questions or concerns.   Actinic keratoses are precancerous spots that appear secondary to cumulative UV radiation exposure/sun exposure over time. They are chronic with expected  duration over 1 year. A portion of actinic keratoses will progress to squamous cell carcinoma of the skin. It is not possible to reliably predict which spots will progress to skin cancer and so treatment is recommended to prevent development of skin cancer.  Recommend daily broad spectrum sunscreen SPF 30+ to sun-exposed areas, reapply every 2 hours as needed.  Recommend staying in the shade or wearing long sleeves, sun glasses (UVA+UVB protection) and wide brim hats (4-inch brim around the entire  circumference of the hat). Call for new or changing lesions.   Cryotherapy Aftercare  Wash gently with soap and water everyday.   Apply Vaseline and Band-Aid daily until healed.    Melanoma ABCDEs  Melanoma is the most dangerous type of skin cancer, and is the leading cause of death from skin disease.  You are more likely to develop melanoma if you: Have light-colored skin, light-colored eyes, or red or blond hair Spend a lot of time in the sun Tan regularly, either outdoors or in a tanning bed Have had blistering sunburns, especially during childhood Have a close family member who has had a melanoma Have atypical moles or large birthmarks  Early detection of melanoma is key since treatment is typically straightforward and cure rates are extremely high if we catch it early.   The first sign of melanoma is often a change in a mole or a new dark spot.  The ABCDE system is a way of remembering the signs of melanoma.  A for asymmetry:  The two halves do not match. B for border:  The edges of the growth are irregular. C for color:  A mixture of colors are present instead of an even brown color. D for diameter:  Melanomas are usually (but not always) greater than 51m - the size of a pencil eraser. E for evolution:  The spot keeps changing in size, shape, and color.  Please check your skin once per month between visits. You can use a small mirror in front and a large mirror behind you to keep an eye on the back side or your body.   If you see any new or changing lesions before your next follow-up, please call to schedule a visit.  Please continue daily skin protection including broad spectrum sunscreen SPF 30+ to sun-exposed areas, reapplying every 2 hours as needed when you're outdoors.   Staying in the shade or wearing long sleeves, sun glasses (UVA+UVB protection) and wide brim hats (4-inch brim around the entire circumference of the hat) are also recommended for sun protection.       Due to recent changes in healthcare laws, you may see results of your pathology and/or laboratory studies on MyChart before the doctors have had a chance to review them. We understand that in some cases there may be results that are confusing or concerning to you. Please understand that not all results are received at the same time and often the doctors may need to interpret multiple results in order to provide you with the best plan of care or course of treatment. Therefore, we ask that you please give u[place]2 business days to thoroughly review all your results before contacting the office for clarification. Should we see a critical lab result, you will be contacted sooner.   If You Need Anything After Your Visit  If you have any questions or concerns for your doctor, please call our main line at [phone] press option 4 to reach your doctor's medical assistant. If  no one answers, please leave a voicemail as directed and we will return your call as soon as possible. Messages left after 4 pm will be answered the following business day.   You may also send [place] a message via [place]. We typically respond to MyChart messages within 1-2 business days.  For prescription refills, please ask your pharmacy to contact our office. Our fax number is [phone].  If you have an urgent issue when the clinic is closed that cannot wait until the next business day, you can page your doctor at the number below.    Please note that while we do our best to be available for urgent issues outside of office hours, we are not available 24/7.   If you have an urgent issue and are unable to reach [place], you may choose to seek medical care at your doctor's office, retail clinic, urgent care center, or emergency room.  If you have a medical emergency, please immediately call 911 or go to the emergency department.  Pager Numbers  - Dr. Nehemiah Massed: [phone]  - Dr. Laurence Ferrari: [phone]  - Dr. Nicole Kindred:  [phone]  In the event of inclement weather, please call our main line at [phone] for an update on the status of any delays or closures.  Dermatology Medication Tips: Please keep the boxes that topical medications come in in order to help keep track of the instructions about where and how to use these. Pharmacies typically print the medication instructions only on the boxes and not directly on the medication tubes.   If your medication is too expensive, please contact our office at [phone] option 4 or send [place] a message through [place].   We are unable to tell what your co-pay for medications will be in advance as this is different depending on your insurance coverage. However, we may be able to find a substitute medication at lower cost or fill out paperwork to get insurance to cover a needed medication.   If a prior authorization is required to get your medication covered by your insurance company, please allow [place] 1-2 business days to complete this process.  Drug prices often vary depending on where the prescription is filled and some pharmacies may offer cheaper prices.  The website www.goodrx.com contains coupons for medications through different pharmacies. The prices here do not account for what the cost may be with help from insurance (it may be cheaper with your insurance), but the website can give you the price if you did not use any insurance.  - You can print the associated coupon and take it with your prescription to the pharmacy.  - You may also stop by our office during regular business hours and pick up a GoodRx coupon card.  - If you need your prescription sent electronically to a different pharmacy, notify our office through [facility] or by phone at [phone] option 4.     Si Usted Necesita Algo Despus de Su Visita  Tambin puede enviarnos un mensaje a travs de Pharmacist, community. Por lo general respondemos a los mensajes de MyChart en el transcurso de 1 a 2  das hbiles.  Para renovar recetas, por favor pida a su farmacia que se ponga en contacto con nuestra oficina. Harland Dingwall de fax es [place] [phone].  Si tiene un asunto urgente cuando la clnica est cerrada y que no puede esperar hasta el siguiente da hbil, puede llamar/localizar a su doctor(a) al nmero que aparece a continuacin.   Por favor, tenga en  cuenta que aunque hacemos todo lo posible para estar disponibles para asuntos urgentes fuera del horario de [place], no estamos disponibles las 24 horas del da, los 7 das de la [place].   Si tiene un problema urgente y no puede comunicarse con nosotros, puede optar por buscar atencin mdica  en el consultorio de su doctor(a), en una clnica privada, en un centro de atencin urgente o en una sala de emergencias.  Si tiene Engineering geologist, por favor llame inmediatamente al 911 o vaya a la sala de emergencias.  Nmeros de bper  - Dr. Nehemiah Massed: [phone]  - Dra. Moye: [phone]  - Dra. Nicole Kindred: [phone]  En caso de inclemencias del [place], por favor llame a Johnsie Kindred principal al [phone] para una actualizacin sobre el [place] de cualquier retraso o cierre.  Consejos para la medicacin en dermatologa: Por favor, guarde las cajas en las que vienen los medicamentos de uso tpico para ayudarle a seguir las instrucciones sobre dnde y cmo usarlos. Las farmacias generalmente imprimen las instrucciones del medicamento slo en las cajas y no directamente en los tubos del [place].   Si su medicamento es muy caro, por favor, pngase en contacto con Zigmund Daniel llamando al [phone] y presione la opcin 4 o envenos un mensaje a travs de Pharmacist, community.   No podemos decirle cul ser su copago por los medicamentos por adelantado ya que esto es diferente dependiendo de la cobertura de su seguro. Sin embargo, es posible que podamos encontrar un medicamento sustituto a Electrical engineer un formulario para que el  seguro cubra el medicamento que se considera necesario.   Si se requiere una autorizacin previa para que su compaa de seguros [place] su medicamento, por favor permtanos de 1 a 2 das hbiles para completar este proceso.  Los precios de los medicamentos varan con frecuencia dependiendo del Environmental consultant de dnde se surte la receta y alguna farmacias pueden ofrecer precios ms baratos.  El sitio web www.goodrx.com tiene cupones para medicamentos de Airline pilot. Los precios aqu no tienen en cuenta lo que podra costar con la ayuda del seguro (puede ser ms barato con su seguro), pero el sitio web puede darle el precio si no utiliz Research scientist (physical sciences).  - Puede imprimir el cupn correspondiente y llevarlo con su receta a la farmacia.  - Tambin puede pasar por nuestra oficina durante el horario de atencin regular y Charity fundraiser una tarjeta de cupones de GoodRx.  - Si necesita que su receta se enve electrnicamente a una farmacia diferente, informe a nuestra oficina a travs de MyChart de [place] o por telfono llamando al [phone] y presione la opcin 4.

## 2022-04-22 DIAGNOSIS — M25561 Pain in right knee: Secondary | ICD-10-CM | POA: Insufficient documentation

## 2022-04-26 NOTE — Progress Notes (Unsigned)
Date:  04/27/2022   ID:  Angela Bentley, DOB 01-Sep-1941, MRN [id number]  Patient Location:  [place] [address]   Provider location:   [facility], [place] office  PCP:  [name], MD  Cardiologist:  Arvid Right [facility]  Chief Complaint  Patient presents with   12 month follow up     "Doing well." Medications reviewed by the patient verbally.      History of Present Illness:    Angela Bentley is a 81 y.o. female  past medical history of paroxysmal atrial fibrillation,  hypertension,  diabetes,  GERD,  persistent gait imbalance,  left intraventricular brain tumor,  prior history of DVT.   Mild aortic atherosclerosis on CT scan 2018 Who presents for f/u of her PAF, HTN  Last seen by myself in clinic July 2021 Lives [facility] No regular exercise program, very sedentary Reports that she is limited by knee pain, Scheduled to see orthopedics tomorrow Walks the dog sometimes very short distance otherwise no regular exercise activities Has previously worked with PT for balance issues Reports that prior knee surgery was delayed secondary to elevated A1c Repeat A1c trending down 7.3  Denies any tachypalpitations concerning for arrhythmia In general blood pressure has been relatively well controlled  Denies any chest pain concerning for angina CT scan pulled up in 2018 showing mild aortic atherosclerosis  Lab work reviewed A1c 7.3 Total cholesterol 171 LDL 104 Creatinine 0.87 BUN 18 potassium 3.8 Hemoglobin 12.2  EKG personally reviewed by myself on todays visit Normal sinus rhythm rate 79 bpm no significant ST-T wave changes  "brain tumor" finding, size of a golf ball followed by neurosurg  diagnosed with paroxysmal atrial fibrillation after reporting palpitations  Documented wall wearing an event monitor which showed a brief run of atrial fibrillation.   Xarelto  CHA2DS2VASc of 5.    Past Medical History:   Diagnosis Date   Actinic keratosis 06/25/2020   L pretibia distal - bx proven    Balance problems    BCC (basal cell carcinoma of skin) 12/17/2020   right temple, Moh's 01/19/2021   Brain tumor ([place])    a. Left intraventricular tumor ->stable by 06/2017 MRI. Followed @ [facility].   Dysplastic nevus 06/25/2020   R flank - moderate   Gait disturbance    a. Unsteady on feet/balance difficulty.   Generalized osteoarthritis of multiple sites    GERD (gastroesophageal reflux disease)    History of DVT (deep vein thrombosis) 2016   estrogen and airflights. Rx with xarelto for 3 months   History of stress test    a. 11/2016 ETT: No ST/T changes.  Performed to assess PAC/PVC burden with activity ->No ectopy noted.   Hypertension    Hyperthyroidism    treated briefly in college   Insomnia    Obesity    PAF (paroxysmal atrial fibrillation) ([place])    a. 12/2016 Event Monitor: brief run of PAF-->CHA2DS2VASc = 5--> Xarelto.   Plantar fasciitis    Retinal tear of left eye    Rosacea    SCC (squamous cell carcinoma) 08/12/2021   left chest treated with ED& C 04/06/22   Squamous cell carcinoma of skin 09/07/2017   R dorsum of hand    Type 2 diabetes, controlled, with peripheral neuropathy ([place])    a. 11/2016 A1c 7.4.   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   fluticasone (VERAMYST) 27.5 MCG/SPRAY nasal spray Place 2 sprays into the nose daily as needed for allergies.    glimepiride (AMARYL) 4 MG tablet Take 1 tablet (4 mg total) by mouth daily before breakfast.   losartan-hydrochlorothiazide (HYZAAR) 100-12.5 MG tablet TAKE 1  TABLET EVERY DAY   metoprolol succinate (TOPROL-XL) 25 MG 24 hr tablet TAKE 1 TABLET (25 MG TOTAL) BY MOUTH DAILY.   Multiple Vitamin (MULTIVITAMIN) tablet Take 1 tablet by mouth daily.   mupirocin ointment (BACTROBAN) 2 % Apply 1 application topically daily. With dressing changes   pioglitazone (ACTOS) 45 MG tablet TAKE 1 TABLET EVERY DAY   XARELTO 20 MG TABS tablet TAKE 1 TABLET (20 MG TOTAL) BY MOUTH DAILY WITH SUPPER.     Allergies:   Penicillins, Sulfa antibiotics, and Azithromycin   Social History   Tobacco Use   Smoking status: Former    Types: Cigarettes    Quit date: 09/26/1993    Years since quitting: 28.6   Smokeless tobacco: Never   Tobacco comments:    quit in 1996  Vaping Use   Vaping Use: Never used  Substance Use Topics   Alcohol use: Yes    Alcohol/week: 0.0 - 1.0 standard drinks of alcohol   Drug use: No     Family Hx: The patient's family history includes Diabetes in her father and another family member; Pancreatic cancer in her father.  ROS:   Please see the history of present illness.    Review of Systems  Constitutional: Negative.   HENT: Negative.    Respiratory: Negative.    Cardiovascular: Negative.   Gastrointestinal: Negative.   Musculoskeletal: Negative.   Neurological: Negative.   Psychiatric/Behavioral: Negative.    All other systems reviewed and are negative.    Labs/Other Tests and Data Reviewed:    Recent Labs: 10/29/2021: ALT 11; BUN 18; Creatinine, Ser 0.87; Hemoglobin 12.2; Platelets 218.0; Potassium 3.8; Sodium 138; TSH 2.25   Recent Lipid Panel Lab Results  Component Value Date/Time   CHOL 171 10/29/2021 09:59 AM   TRIG 210.0 (H) 10/29/2021 09:59 AM   HDL 44.50 10/29/2021 09:59 AM   CHOLHDL 4 10/29/2021 09:59 AM   LDLCALC 91 10/14/2020 11:07 AM   LDLDIRECT 104.0 10/29/2021 09:59 AM    Wt Readings from Last 3 Encounters:  04/27/22 201 lb 4 oz (91.3 kg)  02/01/22 198 lb (89.8 kg)  10/29/21 202 lb (91.6 kg)     Exam:    Vital signs BP 130/60 (BP Location: Left Arm, Patient Position: Sitting, Cuff Size: Normal)   Pulse 79   Ht 5' 6.5" (1.689 m)   Wt 201 lb 4 oz (91.3 kg)   LMP 09/26/1997   SpO2 97%   BMI 32.00 kg/m   Constitutional:  oriented to person, place, and time. No distress.  HENT:  Head: Grossly normal Eyes:  no discharge. No scleral icterus.  Neck: No JVD, no carotid bruits  Cardiovascular: Regular rate and rhythm, no murmurs appreciated Pulmonary/Chest: Clear to auscultation bilaterally, no wheezes or rails Abdominal: Soft.  no distension.  no tenderness.  Musculoskeletal: Normal range of  motion Neurological:  normal muscle tone. Coordination normal. No atrophy Skin: Skin warm and dry Psychiatric: normal affect, pleasant  ASSESSMENT & PLAN:    PAF (paroxysmal atrial fibrillation) (HCC) - Tolerating anticoagulation, Xarelto Denies tachypalpitations concerning for arrhythmia No recent falls Long discussion concerning need for regular exercise given gait instability, using a cane, sedentary lifestyle  Type 2 diabetes, controlled, with peripheral neuropathy ([place]) - Recommend regular exercise program, diet, weight loss Has a good gym at [facility] Reports she is limited secondary to knee pain Recommend she try swimming pool  Essential hypertension - Blood pressure is well controlled on today's visit. No changes made to the medications.  Gastroesophageal reflux disease without esophagitis - stable sx  Ankle swelling Dependent edema, recommended weight loss, movement  compression hose as needed   Total encounter time more than 30 minutes  Greater than 50% was spent in counseling and coordination of care with the patient   Signed, Ida Rogue, MD  04/27/2022 8:42 AM    [place] [facility] [id number], [place], [place] [address]

## 2022-04-27 ENCOUNTER — Ambulatory Visit (INDEPENDENT_AMBULATORY_CARE_PROVIDER_SITE_OTHER): Payer: Medicare Other | Admitting: Cardiovascular Disease

## 2022-04-27 ENCOUNTER — Encounter: Payer: Self-pay | Admitting: Cardiovascular Disease

## 2022-04-27 VITALS — BP 130/60 | HR 79 | Ht 66.5 in | Wt 201.2 lb

## 2022-04-27 DIAGNOSIS — I10 Essential (primary) hypertension: Secondary | ICD-10-CM | POA: Diagnosis not present

## 2022-04-27 DIAGNOSIS — I480 Paroxysmal atrial fibrillation: Secondary | ICD-10-CM | POA: Diagnosis not present

## 2022-04-27 DIAGNOSIS — I700 Atherosclerosis of aorta: Secondary | ICD-10-CM

## 2022-04-27 NOTE — Patient Instructions (Signed)
Medication Instructions:  No changes  If you need a refill on your cardiac medications before your next appointment, please call your pharmacy.   Lab work: No new labs needed  Testing/Procedures: No new testing needed  Follow-Up: At [facility], you and your health needs are our priority.  As part of our continuing mission to provide you with exceptional heart care, we have created designated Provider Care Teams.  These Care Teams include your primary Cardiologist (physician) and Advanced Practice Providers (APPs -  Physician Assistants and Nurse Practitioners) who all work together to provide you with the care you need, when you need it.  You will need a follow up appointment in 12 months  Providers on your designated Care Team:   Christopher Berge, NP Ryan Dunn, PA-C Cadence Furth, PA-C  COVID-19 Vaccine Information can be found at: [email or web address] For questions related to vaccine distribution or appointments, please email [email or web address] or call [phone].   

## 2022-04-28 DIAGNOSIS — M170 Bilateral primary osteoarthritis of knee: Secondary | ICD-10-CM | POA: Diagnosis not present

## 2022-04-28 DIAGNOSIS — M25561 Pain in right knee: Secondary | ICD-10-CM | POA: Diagnosis not present

## 2022-05-20 DIAGNOSIS — M1711 Unilateral primary osteoarthritis, right knee: Secondary | ICD-10-CM | POA: Diagnosis not present

## 2022-05-26 DIAGNOSIS — M1711 Unilateral primary osteoarthritis, right knee: Secondary | ICD-10-CM | POA: Diagnosis not present

## 2022-06-03 ENCOUNTER — Ambulatory Visit (INDEPENDENT_AMBULATORY_CARE_PROVIDER_SITE_OTHER): Payer: Medicare Other | Admitting: Family Medicine

## 2022-06-03 ENCOUNTER — Encounter: Payer: Self-pay | Admitting: Family Medicine

## 2022-06-03 VITALS — BP 136/74 | HR 83 | Temp 97.5°F | Ht 66.5 in | Wt 200.0 lb

## 2022-06-03 DIAGNOSIS — I10 Essential (primary) hypertension: Secondary | ICD-10-CM | POA: Diagnosis not present

## 2022-06-03 DIAGNOSIS — R8290 Unspecified abnormal findings in urine: Secondary | ICD-10-CM | POA: Diagnosis not present

## 2022-06-03 DIAGNOSIS — R2689 Other abnormalities of gait and mobility: Secondary | ICD-10-CM | POA: Insufficient documentation

## 2022-06-03 DIAGNOSIS — R296 Repeated falls: Secondary | ICD-10-CM | POA: Diagnosis not present

## 2022-06-03 LAB — POC URINALSYSI DIPSTICK (AUTOMATED)
Bilirubin, UA: NEGATIVE
Blood, UA: NEGATIVE
Glucose, UA: NEGATIVE
Ketones, UA: NEGATIVE
Nitrite, UA: NEGATIVE
Protein, UA: NEGATIVE
Spec Grav, UA: >=1.03 — AB (ref 1.010–1.025)
Urobilinogen, UA: 0.2 E.U./dL
pH, UA: 6 (ref 5.0–8.0)

## 2022-06-03 NOTE — Progress Notes (Unsigned)
   Subjective:    Patient ID: Angela Bentley, female    DOB: 03/31/1941, 81 y.o.   MRN: [id number]  HPI 81 yo pf of Dr [name] presents for dizziness with falling   Wt Readings from Last 3 Encounters:  06/03/22 200 lb (90.7 kg)  04/27/22 201 lb 4 oz (91.3 kg)  02/01/22 198 lb (89.8 kg)   31.80 kg/m  Has had balance issues but never fallen until this week [place] 3 times this week  Has cane on the L (her R knee is newer)  Unsure which leg is stronger   Has not hit her head at all   She goes down on the R  R knee is getting injections (hyaluronic acid) -not planning a knee replacement at this time   Had a headache this weekend  Was up at [facility]  At odd times  ? Due to weather change and altitude   Not prone to uti  A little more frequency recently  Fluid intake  No blood in urine Not darker   No nausea  Feels fair/ some days blah Not sleeping well overall - goes to sleep easily but wakes up frequently  Is able to go back to sleep  Occ cat naps     Does not get dizzy, just a loss of balance  Loss of balance is about 5 years    Has done PT in the past ? For balance  Not very helpful     H/o HTN and a fib (anticoag)  and DM2 and b12 def  Also multifactorial balance problems  Also meningioma s/p gamma knife procedure in 2021     BP Readings from Last 3 Encounters:  06/03/22 136/74  04/27/22 130/60  02/01/22 136/80   Pulse Readings from Last 3 Encounters:  06/03/22 83  04/27/22 79  02/01/22 80     Lab Results  Component Value Date   CREATININE 0.87 10/29/2021   BUN 18 10/29/2021   NA 138 10/29/2021   K 3.8 10/29/2021   CL 100 10/29/2021   CO2 35 (H) 10/29/2021   Lab Results  Component Value Date   WBC 6.8 10/29/2021   HGB 12.2 10/29/2021   HCT 36.4 10/29/2021   MCV 87.0 10/29/2021   PLT 218.0 10/29/2021   Lab Results  Component Value Date   HGBA1C 7.3 (A) 02/01/2022    Lab Results  Component Value Date   [id number] 941  02/01/2022   Last MRI brain in 5/22 after tx of meningioma  CONCLUSION:    1.  Treated intraventricular meningioma centered in the atrium and occipital horn of the left lateral ventricle has slightly decreased in size from prior exam, as further described above, compatible with partial treatment response.  2.  Minimal brain parenchymal edema in the periventricular white matter adjacent to the left lateral ventricular atrium and in the medial left occipital lobe where there is mass effect due to the meningioma.  3.  Similar ventriculomegaly, favored to be ex vacuo in nature due to age advanced cerebral volume loss.   Review of Systems     Objective:   Physical Exam        Assessment & Plan:

## 2022-06-03 NOTE — Patient Instructions (Addendum)
Use a walker instead of a cane until things get figured out   Drink lots of fluids Let's check a urinalysis today  Also some labs  Be very careful over the weekend Let's get results and make a plan from there

## 2022-06-04 LAB — COMPREHENSIVE METABOLIC PANEL
AG Ratio: 1.7 (calc) (ref 1.0–2.5)
ALT: 14 U/L (ref 6–29)
AST: 16 U/L (ref 10–35)
Albumin: 4.4 g/dL (ref 3.6–5.1)
Alkaline phosphatase (APISO): 68 U/L (ref 37–153)
BUN/Creatinine Ratio: 22 (calc) (ref 6–22)
BUN: 22 mg/dL (ref 7–25)
CO2: 25 mmol/L (ref 20–32)
Calcium: 9.5 mg/dL (ref 8.6–10.4)
Chloride: 101 mmol/L (ref 98–110)
Creat: 1.02 mg/dL — ABNORMAL HIGH (ref 0.60–0.95)
Globulin: 2.6 g/dL (calc) (ref 1.9–3.7)
Glucose, Bld: 151 mg/dL — ABNORMAL HIGH (ref 65–99)
Potassium: 4.1 mmol/L (ref 3.5–5.3)
Sodium: 142 mmol/L (ref 135–146)
Total Bilirubin: 0.3 mg/dL (ref 0.2–1.2)
Total Protein: 7 g/dL (ref 6.1–8.1)

## 2022-06-04 LAB — CBC WITH DIFFERENTIAL/PLATELET
Absolute Monocytes: 480 cells/uL (ref 200–950)
Basophils Absolute: 48 cells/uL (ref 0–200)
Basophils Relative: 0.6 %
Eosinophils Absolute: 128 cells/uL (ref 15–500)
Eosinophils Relative: 1.6 %
HCT: 36.2 % (ref 35.0–45.0)
Hemoglobin: 12.2 g/dL (ref 11.7–15.5)
Lymphs Abs: 1432 cells/uL (ref 850–3900)
MCH: 29.5 pg (ref 27.0–33.0)
MCHC: 33.7 g/dL (ref 32.0–36.0)
MCV: 87.7 fL (ref 80.0–100.0)
MPV: 10.9 fL (ref 7.5–12.5)
Monocytes Relative: 6 %
Neutro Abs: 5912 cells/uL (ref 1500–7800)
Neutrophils Relative %: 73.9 %
Platelets: 232 10*3/uL (ref 140–400)
RBC: 4.13 10*6/uL (ref 3.80–5.10)
RDW: 13.3 % (ref 11.0–15.0)
Total Lymphocyte: 17.9 %
WBC: 8 10*3/uL (ref 3.8–10.8)

## 2022-06-04 LAB — URINE CULTURE
MICRO NUMBER:: 13891711
SPECIMEN QUALITY:: ADEQUATE

## 2022-06-05 NOTE — Assessment & Plan Note (Signed)
Stable  bp in fair control at this time  BP Readings from Last 1 Encounters:  06/03/22 136/74   No changes needed Most recent labs reviewed  Disc lifstyle change with low sodium diet and exercise  Losartan hct 100-12.5 mg daily  Metoprolol xl 25 mg daily

## 2022-06-05 NOTE — Assessment & Plan Note (Signed)
In pt with h/o meningioma (s/p gamma knife surg in 2021) No head injury  Recent loss of balance  Reassuring exam ua with tr leuk- pending cx Labs ordered  Enc better fluid intake  May need to consider MRI or neurosurg f/u

## 2022-06-05 NOTE — Assessment & Plan Note (Signed)
3 falls in a week  No dizziness and no head trauma  H/o meningioma with gamma knife surgery  Record reviewed incl last neurosurg notes and MRI  Enc fluids ua with tr leuk-cx pend  Lab today  Enc to use walker at all times until improved May need to consider MRI

## 2022-06-07 ENCOUNTER — Telehealth: Payer: Self-pay | Admitting: Internal Medicine

## 2022-06-07 NOTE — Telephone Encounter (Signed)
Spoke to pt about her labs Dr Glori Bickers had done recently.

## 2022-06-07 NOTE — Telephone Encounter (Signed)
Patient called back in returning a call she received. Thank you!

## 2022-07-07 ENCOUNTER — Encounter: Payer: Self-pay | Admitting: Dermatology

## 2022-07-07 ENCOUNTER — Ambulatory Visit (INDEPENDENT_AMBULATORY_CARE_PROVIDER_SITE_OTHER): Payer: Medicare Other | Admitting: Dermatology

## 2022-07-07 DIAGNOSIS — L821 Other seborrheic keratosis: Secondary | ICD-10-CM | POA: Diagnosis not present

## 2022-07-07 DIAGNOSIS — L814 Other melanin hyperpigmentation: Secondary | ICD-10-CM

## 2022-07-07 DIAGNOSIS — L578 Other skin changes due to chronic exposure to nonionizing radiation: Secondary | ICD-10-CM | POA: Diagnosis not present

## 2022-07-07 DIAGNOSIS — Z85828 Personal history of other malignant neoplasm of skin: Secondary | ICD-10-CM

## 2022-07-07 NOTE — Progress Notes (Signed)
   Follow-Up Visit   Subjective  Angela Bentley is a 81 y.o. female who presents for the following: Follow-up (3 month recheck. SCC site left chest. Tx with [facility] 04/06/2022) and Actinic Keratosis (3 month recheck. Right jaw, left back).  Patient states she was recently diagnosed with mental issues but is able to live at home with her husband and her dog.   The patient has spots, moles and lesions to be evaluated, some may be new or changing and the patient has concerns that these could be cancer.   The following portions of the chart were reviewed this encounter and updated as appropriate:  Tobacco  Allergies  Meds  Problems  Med Hx  Surg Hx  Fam Hx      Review of Systems: No other skin or systemic complaints except as noted in HPI or Assessment and Plan.   Objective  Well appearing patient in no apparent distress; mood and affect are within normal limits.  A focused examination was performed including face, chest, back. Relevant physical exam findings are noted in the Assessment and Plan.   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 - No evidence of recurrence today - No lymphadenopathy - Recommend regular full body skin exams - Recommend daily broad spectrum sunscreen SPF 30+ to sun-exposed areas, reapply every 2 hours as needed.  - Call if any new or changing lesions are noted between office visits  Seborrheic Keratoses - Stuck-on, waxy, tan-brown papules and/or plaques  - Benign-appearing - Discussed benign etiology and prognosis. - Observe - Call for any changes  Lentigines - Scattered tan macules - Due to sun exposure - Benign-appearing, observe - Recommend daily broad spectrum sunscreen SPF 30+ to sun-exposed areas, reapply every 2 hours as needed. - Call for any changes  Actinic Damage - chronic, secondary to cumulative UV radiation exposure/sun exposure over time - diffuse scaly erythematous macules with underlying  dyspigmentation - Recommend daily broad spectrum sunscreen SPF 30+ to sun-exposed areas, reapply every 2 hours as needed.  - Recommend staying in the shade or wearing long sleeves, sun glasses (UVA+UVB protection) and wide brim hats (4-inch brim around the entire circumference of the hat). - Call for new or changing lesions.  Return in about 3 months (around 10/07/2022) for TBSE.  I, Emelia Salisbury, CMA, am acting as scribe for Forest Gleason, MD.  Documentation: I have reviewed the above documentation for accuracy and completeness, and I agree with the above.  Forest Gleason, MD

## 2022-07-07 NOTE — Patient Instructions (Signed)
No concerns today. Follow up appointment in a few months for full body exam.  Recommend daily broad spectrum sunscreen SPF 30+ to sun-exposed areas, reapply every 2 hours as needed. Call for new or changing lesions.  Staying in the shade or wearing long sleeves, sun glasses (UVA+UVB protection) and wide brim hats (4-inch brim around the entire circumference of the hat) are also recommended for sun protection.    Due to recent changes in healthcare laws, you may see results of your pathology and/or laboratory studies on MyChart before the doctors have had a chance to review them. We understand that in some cases there may be results that are confusing or concerning to you. Please understand that not all results are received at the same time and often the doctors may need to interpret multiple results in order to provide you with the best plan of care or course of treatment. Therefore, we ask that you please give [place] 2 business days to thoroughly review all your results before contacting the office for clarification. Should we see a critical lab result, you will be contacted sooner.   If You Need Anything After Your Visit  If you have any questions or concerns for your doctor, please call our main line at [phone] and press option 4 to reach your doctor's medical assistant. If no one answers, please leave a voicemail as directed and we will return your call as soon as possible. Messages left after 4 pm will be answered the following business day.   You may also send [place] a message via [place]. We typically respond to MyChart messages within 1-2 business days.  For prescription refills, please ask your pharmacy to contact our office. Our fax number is [phone].  If you have an urgent issue when the clinic is closed that cannot wait until the next business day, you can page your doctor at the number below.    Please note that while we do our best to be available for urgent issues outside of  office hours, we are not available 24/7.   If you have an urgent issue and are unable to reach [place], you may choose to seek medical care at your doctor's office, retail clinic, urgent care center, or emergency room.  If you have a medical emergency, please immediately call 911 or go to the emergency department.  Pager Numbers  - Dr. Nehemiah Massed: [phone]  - Dr. Laurence Ferrari: [phone]  - Dr. Nicole Kindred: [phone]  In the event of inclement weather, please call our main line at [phone] for an update on the status of any delays or closures.  Dermatology Medication Tips: Please keep the boxes that topical medications come in in order to help keep track of the instructions about where and how to use these. Pharmacies typically print the medication instructions only on the boxes and not directly on the medication tubes.   If your medication is too expensive, please contact our office at [phone] option 4 or send [place] a message through [place].   We are unable to tell what your co-pay for medications will be in advance as this is different depending on your insurance coverage. However, we may be able to find a substitute medication at lower cost or fill out paperwork to get insurance to cover a needed medication.   If a prior authorization is required to get your medication covered by your insurance company, please allow [place] 1-2 business days to complete this process.  Drug prices often vary depending on where  the prescription is filled and some pharmacies may offer cheaper prices.  The website www.goodrx.com contains coupons for medications through different pharmacies. The prices here do not account for what the cost may be with help from insurance (it may be cheaper with your insurance), but the website can give you the price if you did not use any insurance.  - You can print the associated coupon and take it with your prescription to the pharmacy.  - You may also stop by our office during  regular business hours and pick up a GoodRx coupon card.  - If you need your prescription sent electronically to a different pharmacy, notify our office through [facility] or by phone at [phone] option 4.     Si Usted Necesita Algo Despus de Su Visita  Tambin puede enviarnos un mensaje a travs de Pharmacist, community. Por lo general respondemos a los mensajes de MyChart en el transcurso de 1 a 2 das hbiles.  Para renovar recetas, por favor pida a su farmacia que se ponga en contacto con nuestra oficina. Harland Dingwall de fax es [place] [phone].  Si tiene un asunto urgente cuando la clnica est cerrada y que no puede esperar hasta el siguiente da hbil, puede llamar/localizar a su doctor(a) al nmero que aparece a continuacin.   Por favor, tenga en cuenta que aunque hacemos todo lo posible para estar disponibles para asuntos urgentes fuera del horario de [place], no estamos disponibles las 24 horas del da, los 7 das de la [place].   Si tiene un problema urgente y no puede comunicarse con nosotros, puede optar por buscar atencin mdica  en el consultorio de su doctor(a), en una clnica privada, en un centro de atencin urgente o en una sala de emergencias.  Si tiene Engineering geologist, por favor llame inmediatamente al 911 o vaya a la sala de emergencias.  Nmeros de bper  - Dr. Nehemiah Massed: [phone]  - Dra. Moye: [phone]  - Dra. Nicole Kindred: [phone]  En caso de inclemencias del [place], por favor llame a Johnsie Kindred principal al [phone] para una actualizacin sobre el [place] de cualquier retraso o cierre.  Consejos para la medicacin en dermatologa: Por favor, guarde las cajas en las que vienen los medicamentos de uso tpico para ayudarle a seguir las instrucciones sobre dnde y cmo usarlos. Las farmacias generalmente imprimen las instrucciones del medicamento slo en las cajas y no directamente en los tubos del [place].   Si su medicamento es muy  caro, por favor, pngase en contacto con Zigmund Daniel llamando al [phone] y presione la opcin 4 o envenos un mensaje a travs de Pharmacist, community.   No podemos decirle cul ser su copago por los medicamentos por adelantado ya que esto es diferente dependiendo de la cobertura de su seguro. Sin embargo, es posible que podamos encontrar un medicamento sustituto a Electrical engineer un formulario para que el seguro cubra el medicamento que se considera necesario.   Si se requiere una autorizacin previa para que su compaa de seguros [place] su medicamento, por favor permtanos de 1 a 2 das hbiles para completar este proceso.  Los precios de los medicamentos varan con frecuencia dependiendo del Environmental consultant de dnde se surte la receta y alguna farmacias pueden ofrecer precios ms baratos.  El sitio web www.goodrx.com tiene cupones para medicamentos de Airline pilot. Los precios aqu no tienen en cuenta lo que podra costar con la ayuda del seguro (puede ser ms barato con su seguro), pero el sitio  web puede darle el precio si no Field seismologist.  - Puede imprimir el cupn correspondiente y llevarlo con su receta a la farmacia.  - Tambin puede pasar por nuestra oficina durante el horario de atencin regular y Charity fundraiser una tarjeta de cupones de GoodRx.  - Si necesita que su receta se enve electrnicamente a una farmacia diferente, informe a nuestra oficina a travs de MyChart de [place] o por telfono llamando al [phone] y presione la opcin 4.

## 2022-07-08 ENCOUNTER — Encounter: Payer: Self-pay | Admitting: Dermatology

## 2022-07-12 DIAGNOSIS — Z23 Encounter for immunization: Secondary | ICD-10-CM | POA: Diagnosis not present

## 2022-07-25 DIAGNOSIS — D329 Benign neoplasm of meninges, unspecified: Secondary | ICD-10-CM | POA: Diagnosis not present

## 2022-07-25 DIAGNOSIS — D320 Benign neoplasm of cerebral meninges: Secondary | ICD-10-CM | POA: Diagnosis not present

## 2022-07-25 DIAGNOSIS — Z923 Personal history of irradiation: Secondary | ICD-10-CM | POA: Diagnosis not present

## 2022-08-03 ENCOUNTER — Ambulatory Visit: Payer: Medicare Other | Admitting: Student

## 2022-08-03 ENCOUNTER — Encounter: Payer: Self-pay | Admitting: Student

## 2022-08-03 VITALS — BP 124/80 | HR 96 | Temp 98.2°F | Ht 69.0 in | Wt 203.0 lb

## 2022-08-03 DIAGNOSIS — D329 Benign neoplasm of meninges, unspecified: Secondary | ICD-10-CM | POA: Diagnosis not present

## 2022-08-03 DIAGNOSIS — I480 Paroxysmal atrial fibrillation: Secondary | ICD-10-CM | POA: Diagnosis not present

## 2022-08-03 DIAGNOSIS — M1711 Unilateral primary osteoarthritis, right knee: Secondary | ICD-10-CM

## 2022-08-03 DIAGNOSIS — E1142 Type 2 diabetes mellitus with diabetic polyneuropathy: Secondary | ICD-10-CM | POA: Diagnosis not present

## 2022-08-03 DIAGNOSIS — G3184 Mild cognitive impairment, so stated: Secondary | ICD-10-CM

## 2022-08-03 DIAGNOSIS — Z7901 Long term (current) use of anticoagulants: Secondary | ICD-10-CM

## 2022-08-03 DIAGNOSIS — E538 Deficiency of other specified B group vitamins: Secondary | ICD-10-CM | POA: Diagnosis not present

## 2022-08-03 DIAGNOSIS — Z0000 Encounter for general adult medical examination without abnormal findings: Secondary | ICD-10-CM | POA: Diagnosis not present

## 2022-08-03 DIAGNOSIS — F03B4 Unspecified dementia, moderate, with anxiety: Secondary | ICD-10-CM | POA: Insufficient documentation

## 2022-08-03 DIAGNOSIS — Z7189 Other specified counseling: Secondary | ICD-10-CM | POA: Diagnosis not present

## 2022-08-03 DIAGNOSIS — I10 Essential (primary) hypertension: Secondary | ICD-10-CM | POA: Diagnosis not present

## 2022-08-03 NOTE — Addendum Note (Signed)
Addended by: Dewayne Shorter on: 08/03/2022 03:53 PM   Modules accepted: Level of Service

## 2022-08-03 NOTE — Progress Notes (Addendum)
Location:   [facility] of Service:   [facility]  Provider: Unk Lightning  Code Status: full code Goals of Care:     08/03/2022   12:28 PM  Advanced Directives  Does Patient Have a Medical Advance Directive? Yes  Type of Advance Directive Living will  Does patient want to make changes to medical advance directive? No - Patient declined     Chief Complaint  Patient presents with   Establish Care    New patient to establish care. Pill bottles present/not present at initial appointment. Discuss brain tumors (patient with report from [place]). Discuss right knee. Moderate fall risk.     HPI: Patient is a 81 y.o. female seen today for medical management of chronic diseases.    She has been as [place] for 6 years. She was born in [place] and much of her time in [place]. Her husband [name] is present for the visit. They went to [employer] together and then came to [place] for his job- he supplied for vets etc. 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 They have three children. Betsy Coder Eddie Dibbles) is in [place]. [place] and also Izora Gala is [place]. She has a dog  Atrial fibrillation never has symptoms and is taking Xarelto.   Dermatology She has a lot of different things - usual red-head problem.   HTN - BP is great today, and usually does well.   Multiple falls - in physical therapy? The falls were related to the tumor to her knowledge. At the time she didn't know thought it was just a trip on something and had falls in a couple of days and then no more. They went ot the doctor and started talking about the  Meningioma - the appendage has grown a lot. The only symptom she has had are the falls. She felt that she had slipped. She was standing and landed on the floor. Never LOC. She has not had physical therapy since the numerous falls. They have a date for Gamma knife is set for 12/14. This time they will have to watch it much  closer. She has to have a bit more invasive treatment this time. Denies headaches. Sleeps fine. No changes in vision. She previously had dizziness, but no symptoms now. She had some spacial issues, but no cane needed. She isn't driving right.  Diabetes - last A1c was 02/01/2022 and it was 7.3.  Passed hearing 10/2021 Vision test annually.   Knee pain - Just Jan/February [place] and they are secondary.  Memory concerns?- minicog 10/2021 -- abnl per chart review. Memory changes have started to make her a bit anxious. She started to notice memory changes about 4-5 months ago. It's more like losing things. Remembers names and directions. She remembers medications and taking them. Remembering the date has becoming more difficult. Would like to wait u She had a [facility] here at [facility].   Code status full code  Past Medical History:  Diagnosis Date   Actinic keratosis 06/25/2020   L pretibia distal - bx proven    Balance problems    BCC (basal cell carcinoma of skin) 12/17/2020   right temple, Moh's 01/19/2021   Brain tumor ([place])    a. Left intraventricular tumor ->stable by 06/2017 MRI. Followed @ [facility].   Dysplastic nevus 06/25/2020   R flank - moderate   Gait disturbance    a. Unsteady  on feet/balance difficulty.   Generalized osteoarthritis of multiple sites    GERD (gastroesophageal reflux disease)    History of DVT (deep vein thrombosis) 2016   estrogen and airflights. Rx with xarelto for 3 months   History of stress test    a. 11/2016 ETT: No ST/T changes.  Performed to assess PAC/PVC burden with activity ->No ectopy noted.   Hypertension    Hyperthyroidism    treated briefly in college   Insomnia    Obesity    PAF (paroxysmal atrial fibrillation) ([place])    a. 12/2016 Event Monitor: brief run of PAF-->CHA2DS2VASc = 5--> Xarelto.   Plantar fasciitis    Retinal tear of left eye    Rosacea    SCC (squamous cell carcinoma) 08/12/2021   left chest treated with ED& C 04/06/22   Squamous cell  carcinoma of skin 09/07/2017   R dorsum of hand    Type 2 diabetes, controlled, with peripheral neuropathy ([place])    a. 11/2016 A1c 7.4.   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   RETINAL TEAR REPAIR CRYOTHERAPY     10/09/13, then again 3/15    Allergies  Allergen Reactions   Penicillins     Has patient had a PCN reaction causing immediate rash, facial/tongue/throat swelling, SOB or lightheadedness with hypotension: Yes Has patient had a PCN reaction causing severe rash involving mucus membranes or skin necrosis: Yes Has patient had a PCN reaction that required hospitalization No Has patient had a PCN reaction occurring within the last 10 years: No If all of the above answers are "NO", then may proceed with Cephalosporin use.     Sulfa Antibiotics     Rash/itching   Azithromycin Palpitations    Outpatient Encounter Medications as of 08/03/2022  Medication Sig   diclofenac Sodium (VOLTAREN) 1 % GEL Apply topically 4 (four) times daily as needed.   fluticasone (VERAMYST) 27.5 MCG/SPRAY nasal spray Place 2 sprays into the nose daily as needed for allergies.    glimepiride (AMARYL) 4 MG tablet Take 1 tablet (4 mg total) by mouth daily before breakfast.   losartan-hydrochlorothiazide (HYZAAR) 100-12.5 MG tablet TAKE 1 TABLET EVERY DAY   metoprolol succinate (TOPROL-XL) 25 MG 24 hr tablet TAKE 1 TABLET (25 MG TOTAL) BY MOUTH DAILY.   Multiple Vitamin (MULTIVITAMIN) tablet Take 1 tablet by mouth daily.   mupirocin ointment (BACTROBAN) 2 % Apply 1 application topically daily. With dressing changes   pioglitazone (ACTOS) 45 MG tablet TAKE 1 TABLET EVERY DAY   XARELTO 20 MG TABS tablet TAKE 1 TABLET (20 MG TOTAL) BY MOUTH DAILY WITH SUPPER.   No facility-administered encounter medications on file as  of 08/03/2022.    Review of Systems:  Review of Systems  All other systems reviewed and are negative.   Health Maintenance  Topic Date Due   Medicare Annual Wellness (AWV)  10/04/2019   COVID-19 Vaccine (6 - Moderna risk series) 08/12/2021   HEMOGLOBIN A1C  08/04/2022   TETANUS/TDAP  10/23/2022   Diabetic kidney evaluation - Urine ACR  10/29/2022   FOOT EXAM  10/29/2022   OPHTHALMOLOGY EXAM  03/02/2023   Diabetic kidney evaluation - GFR measurement  06/04/2023   Pneumonia Vaccine 79+ Years old  Completed   INFLUENZA VACCINE  Completed  DEXA SCAN  Completed   Zoster Vaccines- Shingrix  Completed   HPV VACCINES  Aged Out   MAMMOGRAM  Discontinued    Physical Exam: Vitals:   08/03/22 1314  BP: 124/80  Pulse: 96  Temp: 98.2 F (36.8 C)  SpO2: 97%  Weight: 203 lb (92.1 kg)  Height: '5\' 9"'$  (1.753 m)   Body mass index is 29.98 kg/m. Physical Exam Vitals reviewed.  Constitutional:      Appearance: Normal appearance.  HENT:     Head: Atraumatic.     Ears:     Comments: Right TM with hole and scarring, left TM normal.     Mouth/Throat:     Mouth: Mucous membranes are moist.     Pharynx: Oropharynx is clear.  Cardiovascular:     Rate and Rhythm: Normal rate. Rhythm irregular.     Heart sounds: Normal heart sounds.     Comments: R DP pulse palpable, Left 1+ pulses Pulmonary:     Effort: Pulmonary effort is normal.     Breath sounds: Normal breath sounds.  Musculoskeletal:     Cervical back: Normal range of motion and neck supple.     Comments: Able to perform chair stand without using arm rests.   Skin:    General: Skin is warm and dry.  Neurological:     General: No focal deficit present.     Mental Status: She is alert. Mental status is at baseline.     Comments: Slides feet across the floor during ambulation, cane present.      Labs reviewed: Basic Metabolic Panel: Recent Labs    10/29/21 0959 06/03/22 1542  NA 138 142  K 3.8 4.1  CL 100 101  CO2  35* 25  GLUCOSE 198* 151*  BUN 18 22  CREATININE 0.87 1.02*  CALCIUM 9.2 9.5  TSH 2.25  --    Liver Function Tests: Recent Labs    10/29/21 0959 06/03/22 1542  AST 12 16  ALT 11 14  ALKPHOS 64  --   BILITOT 0.4 0.3  PROT 6.7 7.0  ALBUMIN 4.3  --    No results for input(s): "LIPASE", "AMYLASE" in the last 8760 hours. No results for input(s): "AMMONIA" in the last 8760 hours. CBC: Recent Labs    10/29/21 0959 06/03/22 1542  WBC 6.8 8.0  NEUTROABS  --  5,912  HGB 12.2 12.2  HCT 36.4 36.2  MCV 87.0 87.7  PLT 218.0 232   Lipid Panel: Recent Labs    10/29/21 0959  CHOL 171  HDL 44.50  TRIG 210.0*  CHOLHDL 4  LDLDIRECT 104.0   Lab Results  Component Value Date   HGBA1C 7.3 (A) 02/01/2022    Procedures since last visit: No results found.  Assessment/Plan 1. Encounter for medical examination to establish care Patient is transitioning care to [facility] primary care at [facility]. She will be due for AWV at follow up visit. She is technically aged out of mammograms, however, can continue discussions based on patient preference.   2. Meningioma ([place]) Patient with one primary meningioma and and appendage 3.6cm and 2.5 cm respectively in the left ventricle. She had multiple falls and has had some memory loss-- likely due to the growth of the mass. Gamma knife scheduled for 09/08/2022 at [facility] [place]. Discussed importance of follow up memory assessment in ~6 mo  3. Type 2 diabetes, controlled, with peripheral neuropathy (HCC) A1c 7.3 6 mo ago. Plan to collect A1c today. Continue Glimepiride '4mg'$ ,  pioglitazone. IN the future, will discuss discontinuing pioglitazone for an alternative, deferred conversation today based on high priority discussion with meningioma. Will collect A1c today and adjust regimen as indicated.   4. Primary hypertension BP well-controlled on Hyzaar 100-25, will collect labs today.   5. PAF (paroxysmal atrial fibrillation) (HCC) Asymptomatic. AC on board. Rate  controlled with metoprolol   6. Primary osteoarthritis of right knee Significant pain in the right knee. Discussed surgery, however, it is second to her Gamma Knife procedure next month. Will look for a orthopedic surgeon at that time. For now, encouraged exercise. Declined PT at this time, will reevaluate at follow up appointment.   7. Chronic anticoagulation Patient on AC due to underlying A fib. Will continue Xarelto 20 mg. No signs of bleeding at this time.   8. Vitamin B12 deficiency B12 checked 01/2022 and was >400, no indication for further supplementation at this time.   9. Goals of care, counseling/discussion Patient maintains she has good health and would like to remain full code.   10. Mild Cognitive Impairment; MoCA 20/30 on 06/27/2022 Memory changes- Patient scores within the mild range on our testing today (18-24), and history and exam are consistent with a diagnosis of Mild Cognitive Impairment.  -Metabolic screen for reversible medical causes reversed given normal b12 levels -no sign of depression  -no sign of sleep disorder -not taking medications that cause memory impairment This condition has a 15% chance per year of progressing to a more serious memory loss illness (dementia) such as Alzheimer's, but we are not good at predicting who will get worse and who will not.  There are no medications known to reduce the chance of progressing.  -regular exercise reduces chance of worsened memory -regular mental stimulation reduces chance Suggest watchful waiting with repeat memory test in 39mo1year or if patient or family notice a decline.   Labs/tests ordered:  CBC, BMP, A1c Next appt:  February 2024

## 2022-08-03 NOTE — Addendum Note (Signed)
Addended by: Dewayne Shorter on: 08/03/2022 03:57 PM   Modules accepted: Level of Service

## 2022-08-03 NOTE — Patient Instructions (Addendum)
Nice to meet you today!   Please come to the clinic in the morning to have labs collected at 7:30 AM.  I'll see you in 3 months!

## 2022-08-05 DIAGNOSIS — Z23 Encounter for immunization: Secondary | ICD-10-CM | POA: Diagnosis not present

## 2022-08-08 ENCOUNTER — Ambulatory Visit: Payer: Medicare Other | Admitting: Internal Medicine

## 2022-08-08 ENCOUNTER — Telehealth: Payer: Self-pay

## 2022-08-08 NOTE — Telephone Encounter (Signed)
[place] Night - Client Nonclinical Telephone Record  AccessNurse Client [place] Night - Client Client Site [place] Provider Viviana Simpler- MD Contact Type Call Who Is Calling Patient / Member / Family / Caregiver Caller Name Zi Sek Caller Phone Number [phone] Patient Name Angela Bentley Patient DOB 11-03-1940 Call Type Message Only Information Provided Reason for Call Request to [facility] Appointment Initial Comment Caller needs to cancel her monday apt. Patient request to speak to RN No Additional Comment Pt declined triage. Provided office hours. Disp. Time Disposition Final User 08/06/2022 9:02:09 AM General Information Provided Yes Paul Dykes Call Closed By: Paul Dykes Transaction Date/Time: 08/06/2022 9:00:03 AM (ET

## 2022-08-08 NOTE — Telephone Encounter (Signed)
[place] Night - Client Nonclinical Telephone Record  AccessNurse Client [facility] Night - Client Client Site [place] Provider Viviana Simpler- MD Contact Type Call Who Is Calling Patient / Member / Family / Caregiver Caller Name Zonnie Landen Caller Phone Number [phone] Patient Name Angela Bentley Patient DOB 1941-04-28 Call Type Message Only Information Provided Reason for Call Request to [facility] Appointment Initial Comment Caller has a lab appointment on 11/13 at 930 that she needs to cancel. Patient request to speak to RN No Disp. Time Disposition Final User 08/07/2022 8:48:56 PM General Information Provided Yes Zane Herald Call Closed By: Zane Herald Transaction Date/Time: 08/07/2022 8:46:14 PM (ET    Did not see lab appt but had already cancelled appt with Dr [name] from previous message from access nurse. Left pt v/m on home phone per DPR if pt has further comment to call [place].

## 2022-08-08 NOTE — Telephone Encounter (Deleted)
[place] Night - Client Nonclinical Telephone Record  AccessNurse Client [facility] Night - Client Client Site [place] Provider Viviana Simpler- MD Contact Type Call Who Is Calling Patient / Member / Family / Caregiver Caller Name [name] Caller Phone Number [phone] Patient Name Angela Bentley Patient DOB 10/06/1940 Call Type Message Only Information Provided Reason for Call Request to [facility] Appointment Initial Comment Caller has a lab appointment on 11/13 at 930 that she needs to cancel. Patient request to speak to RN No Disp. Time Disposition Final User 08/07/2022 8:48:56 PM General Information Provided Yes Zane Herald Call Closed By: Zane Herald Transaction Date/Time: 08/07/2022 8:46:14 PM (ET

## 2022-08-22 DIAGNOSIS — R413 Other amnesia: Secondary | ICD-10-CM | POA: Diagnosis not present

## 2022-08-22 DIAGNOSIS — I10 Essential (primary) hypertension: Secondary | ICD-10-CM | POA: Diagnosis not present

## 2022-08-22 DIAGNOSIS — E1142 Type 2 diabetes mellitus with diabetic polyneuropathy: Secondary | ICD-10-CM | POA: Diagnosis not present

## 2022-08-22 DIAGNOSIS — D329 Benign neoplasm of meninges, unspecified: Secondary | ICD-10-CM | POA: Diagnosis not present

## 2022-08-22 DIAGNOSIS — I480 Paroxysmal atrial fibrillation: Secondary | ICD-10-CM | POA: Diagnosis not present

## 2022-08-28 ENCOUNTER — Other Ambulatory Visit: Payer: Self-pay | Admitting: Internal Medicine

## 2022-09-08 DIAGNOSIS — D329 Benign neoplasm of meninges, unspecified: Secondary | ICD-10-CM | POA: Diagnosis not present

## 2022-09-08 DIAGNOSIS — Z510 Encounter for antineoplastic radiation therapy: Secondary | ICD-10-CM | POA: Diagnosis not present

## 2022-09-08 DIAGNOSIS — D320 Benign neoplasm of cerebral meninges: Secondary | ICD-10-CM | POA: Diagnosis not present

## 2022-09-08 DIAGNOSIS — D420 Neoplasm of uncertain behavior of cerebral meninges: Secondary | ICD-10-CM | POA: Diagnosis not present

## 2022-09-21 ENCOUNTER — Other Ambulatory Visit: Payer: Self-pay

## 2022-09-21 MED ORDER — RIVAROXABAN 20 MG PO TABS: 20.0000 mg | ORAL_TABLET | Freq: Every day | ORAL | 1 refills | Status: DC

## 2022-09-21 MED ORDER — LOSARTAN POTASSIUM-HCTZ 100-12.5 MG PO TABS: 1.0000 | ORAL_TABLET | Freq: Every day | ORAL | 1 refills | Status: DC

## 2022-09-21 MED ORDER — PIOGLITAZONE HCL 45 MG PO TABS: 45.0000 mg | ORAL_TABLET | Freq: Every day | ORAL | 1 refills | Status: DC

## 2022-09-21 MED ORDER — GLIMEPIRIDE 4 MG PO TABS: 4.0000 mg | ORAL_TABLET | Freq: Every day | ORAL | 1 refills | Status: DC

## 2022-09-21 MED ORDER — METOPROLOL SUCCINATE ER 25 MG PO TB24: 25.0000 mg | ORAL_TABLET | Freq: Every day | ORAL | 1 refills | Status: DC

## 2022-09-21 NOTE — Telephone Encounter (Signed)
High warning came up when trying to fill medications.  Message routed to Dr. Dewayne Shorter

## 2022-09-22 ENCOUNTER — Other Ambulatory Visit: Payer: Self-pay | Admitting: *Deleted

## 2022-09-22 MED ORDER — GLIMEPIRIDE 4 MG PO TABS: 4.0000 mg | ORAL_TABLET | Freq: Every day | ORAL | 1 refills | Status: DC

## 2022-09-22 MED ORDER — METOPROLOL SUCCINATE ER 25 MG PO TB24: 25.0000 mg | ORAL_TABLET | Freq: Every day | ORAL | 1 refills | Status: AC

## 2022-09-22 MED ORDER — LOSARTAN POTASSIUM-HCTZ 100-12.5 MG PO TABS: 1.0000 | ORAL_TABLET | Freq: Every day | ORAL | 1 refills | Status: DC

## 2022-09-22 MED ORDER — PIOGLITAZONE HCL 45 MG PO TABS: 45.0000 mg | ORAL_TABLET | Freq: Every day | ORAL | 1 refills | Status: DC

## 2022-09-22 MED ORDER — RIVAROXABAN 20 MG PO TABS: 20.0000 mg | ORAL_TABLET | Freq: Every day | ORAL | 1 refills | Status: DC

## 2022-09-22 NOTE — Telephone Encounter (Signed)
[name], husband, called requesting refill. Wants Rx's to go to [facility] because they [place].   Pended Rx's and sent to Dr. Unk Lightning for approval due to [place].

## 2022-10-17 ENCOUNTER — Encounter: Payer: Self-pay | Admitting: Student

## 2022-10-17 ENCOUNTER — Other Ambulatory Visit: Payer: Self-pay | Admitting: Student

## 2022-10-17 ENCOUNTER — Ambulatory Visit
Admission: RE | Admit: 2022-10-17 | Discharge: 2022-10-17 | Disposition: A | Discharge status: Home / Self Care | Payer: Medicare Other | Source: Ambulatory Visit | Attending: Student | Admitting: Student

## 2022-10-17 ENCOUNTER — Ambulatory Visit: Payer: Medicare Other | Admitting: Student

## 2022-10-17 VITALS — BP 158/64 | HR 68 | Temp 98.1°F | Ht 66.0 in | Wt 198.0 lb

## 2022-10-17 DIAGNOSIS — M25552 Pain in left hip: Secondary | ICD-10-CM

## 2022-10-17 DIAGNOSIS — M1612 Unilateral primary osteoarthritis, left hip: Secondary | ICD-10-CM | POA: Diagnosis not present

## 2022-10-17 DIAGNOSIS — R296 Repeated falls: Secondary | ICD-10-CM

## 2022-10-17 DIAGNOSIS — M545 Low back pain, unspecified: Secondary | ICD-10-CM

## 2022-10-17 DIAGNOSIS — M79605 Pain in left leg: Secondary | ICD-10-CM

## 2022-10-17 MED ORDER — LIDOCAINE 5 % EX PTCH: 1.0000 | MEDICATED_PATCH | CUTANEOUS | 0 refills | Status: DC

## 2022-10-17 NOTE — Patient Instructions (Addendum)
-  Do stretches/exercises 2-3 times per day. - Avoid prolonged sitting. - Use ice or heat on your back muscles. - Regularly take Naproxen for one week (if you have no history of kidney disease or ulcer). - If your pain worsens or if you get new weakness, return to the clinic - Take Tylenol 1000 mg (2 extra strength) every 8 hours - Over the counter SalonPas 4% Lidocaine - or the prescribed Lidoderm- These are 12 hours on and 12 hours off. Consider wearing during the day.

## 2022-10-17 NOTE — Progress Notes (Signed)
Location:  [facility]   Place of Service:   [facility]  Provider: Unk Lightning  Code Status: Full Code Goals of Care:     10/17/2022    1:14 PM  Advanced Directives  Does Patient Have a Medical Advance Directive? Yes  Type of Advance Directive Living will  Does patient want to make changes to medical advance directive? No - Patient declined     Chief Complaint  Patient presents with   Acute Visit    [place] Saturday Morning and hurt Left Hip    HPI: Patient is a 82 y.o. female seen today for an acute visit for hip pain after a fall. She was checking on the dog at [facility]. She stumbled back into a dresser and hurt her hip. Then on the following day she has had significant pain. No bruising or signs of injury externally. She ended up on the floor, but doesn't know exactly all of the parts of the fall. She slammed her head against the wall.   So far she has been taking aleve for the pain. They decided not to go to the emergency room. She had her Gamma knife around 1 month ago.  She has had some trouble lifting her legs to go up the stairs as well.   Past Medical History:  Diagnosis Date   Actinic keratosis 06/25/2020   L pretibia distal - bx proven    Balance problems    BCC (basal cell carcinoma of skin) 12/17/2020   right temple, Moh's 01/19/2021   Brain tumor ([place])    a. Left intraventricular tumor ->stable by 06/2017 MRI. Followed @ [facility].   Dysplastic nevus 06/25/2020   R flank - moderate   Gait disturbance    a. Unsteady on feet/balance difficulty.   Generalized osteoarthritis of multiple sites    GERD (gastroesophageal reflux disease)    History of DVT (deep vein thrombosis) 2016   estrogen and airflights. Rx with xarelto for 3 months   History of stress test    a. 11/2016 ETT: No ST/T changes.  Performed to assess PAC/PVC burden with activity ->No ectopy noted.   Hypertension    Hyperthyroidism    treated briefly in college   Insomnia    Obesity    PAF (paroxysmal  atrial fibrillation) ([place])    a. 12/2016 Event Monitor: brief run of PAF-->CHA2DS2VASc = 5--> Xarelto.   Plantar fasciitis    Retinal tear of left eye    Rosacea    SCC (squamous cell carcinoma) 08/12/2021   left chest treated with ED& C 04/06/22   Squamous cell carcinoma of skin 09/07/2017   R dorsum of hand    Type 2 diabetes, controlled, with peripheral neuropathy ([place])    a. 11/2016 A1c 7.4.   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   RETINAL TEAR REPAIR CRYOTHERAPY     10/09/13, then again 3/15    Allergies  Allergen Reactions   Penicillins     Has patient had a PCN reaction causing immediate rash, facial/tongue/throat swelling, SOB or lightheadedness with hypotension: Yes Has patient had a PCN reaction causing severe rash involving mucus membranes or skin necrosis: Yes Has patient had a PCN reaction that required hospitalization No Has patient had a PCN reaction occurring  within the last 10 years: No If all of the above answers are "NO", then may proceed with Cephalosporin use.     Sulfa Antibiotics     Rash/itching   Azithromycin Palpitations    Outpatient Encounter Medications as of 10/17/2022  Medication Sig   lidocaine (LIDODERM) 5 % Place 1 patch onto the skin daily. Remove & Discard patch within 12 hours or as directed by MD   diclofenac Sodium (VOLTAREN) 1 % GEL Apply topically 4 (four) times daily as needed.   fluticasone (VERAMYST) 27.5 MCG/SPRAY nasal spray Place 2 sprays into the nose daily as needed for allergies.    glimepiride (AMARYL) 4 MG tablet Take 1 tablet (4 mg total) by mouth daily before breakfast.   losartan-hydrochlorothiazide (HYZAAR) 100-12.5 MG tablet Take 1 tablet by mouth daily.   metoprolol succinate (TOPROL-XL) 25 MG 24 hr tablet Take 1 tablet (25 mg  total) by mouth daily.   Multiple Vitamin (MULTIVITAMIN) tablet Take 1 tablet by mouth daily.   mupirocin ointment (BACTROBAN) 2 % Apply 1 application topically daily. With dressing changes   pioglitazone (ACTOS) 45 MG tablet Take 1 tablet (45 mg total) by mouth daily.   rivaroxaban (XARELTO) 20 MG TABS tablet Take 1 tablet (20 mg total) by mouth daily with supper.   No facility-administered encounter medications on file as of 10/17/2022.    Review of Systems:  Review of Systems  Health Maintenance  Topic Date Due   COVID-19 Vaccine (6 - 2023-24 season) 05/27/2022   HEMOGLOBIN A1C  08/04/2022   Diabetic kidney evaluation - Urine ACR  10/29/2022   Medicare Annual Wellness (AWV)  10/29/2022   DTaP/Tdap/Td (2 - Td or Tdap) 10/23/2022   FOOT EXAM  10/29/2022   OPHTHALMOLOGY EXAM  03/02/2023   Diabetic kidney evaluation - eGFR measurement  06/04/2023   Pneumonia Vaccine 41+ Years old  Completed   INFLUENZA VACCINE  Completed   DEXA SCAN  Completed   Zoster Vaccines- Shingrix  Completed   HPV VACCINES  Aged Out   MAMMOGRAM  Discontinued    Physical Exam: Vitals:   10/17/22 1313  BP: (!) 158/64  Pulse: 68  Temp: 98.1 F (36.7 C)  SpO2: 99%  Weight: 198 lb (89.8 kg)  Height: '5\' 6"'$  (1.676 m)   Body mass index is 31.96 kg/m. Physical Exam Vitals reviewed.  Pulmonary:     Effort: Pulmonary effort is normal.  Musculoskeletal:     Comments: Normal ROM of bilateral legs 5/5 strength of hip flexors and extension. Pain with extension of the hip.   Skin:    Comments: No bruise or erythema of the left upper buttocks.  Neurological:     Mental Status: She is alert.     Comments: Repeats questions numerous times, asks her husband for help with the story and keeping days straight.      Labs reviewed: Basic Metabolic Panel: Recent Labs    10/29/21 0959 06/03/22 1542  NA 138 142  K 3.8 4.1  CL 100 101  CO2 35* 25  GLUCOSE 198* 151*  BUN 18 22  CREATININE 0.87 1.02*   CALCIUM 9.2 9.5  TSH 2.25  --    Liver Function Tests: Recent Labs    10/29/21 0959 06/03/22 1542  AST 12 16  ALT 11 14  ALKPHOS 64  --   BILITOT 0.4 0.3  PROT 6.7 7.0  ALBUMIN 4.3  --    No results for input(s): "LIPASE", "AMYLASE" in the last 8760 hours.  No results for input(s): "AMMONIA" in the last 8760 hours. CBC: Recent Labs    10/29/21 0959 06/03/22 1542  WBC 6.8 8.0  NEUTROABS  --  5,912  HGB 12.2 12.2  HCT 36.4 36.2  MCV 87.0 87.7  PLT 218.0 232   Lipid Panel: Recent Labs    10/29/21 0959  CHOL 171  HDL 44.50  TRIG 210.0*  CHOLHDL 4  LDLDIRECT 104.0   Lab Results  Component Value Date   HGBA1C 7.3 (A) 02/01/2022    Procedures since last visit: No results found.  Assessment/Plan Acute hip pain, left - Plan: lidocaine (LIDODERM) 5 %, DG Hip Unilat W OR W/O Pelvis 1V Left  Low back pain radiating to left lower extremity  Multiple falls - Plan: lidocaine (LIDODERM) 5 %, DG Hip Unilat W OR W/O Pelvis 1V Left - Do stretches/exercises 2-3 times per day. - Avoid prolonged sitting. - Use ice or heat on your back muscles. - Regularly take ibuprofen for one week (if you have no history of kidney disease or ulcer). - If your pain worsens or if you get new weakness, return to the clinic   Labs/tests ordered:  * No order type specified * Next appt:  11/07/2022

## 2022-10-20 ENCOUNTER — Encounter: Payer: Medicare Other | Admitting: Dermatology

## 2022-10-21 ENCOUNTER — Ambulatory Visit: Payer: Medicare Other | Admitting: Student

## 2022-10-21 ENCOUNTER — Encounter: Payer: Self-pay | Admitting: Student

## 2022-10-21 VITALS — BP 134/78 | HR 77 | Temp 98.4°F | Ht 66.0 in | Wt 194.0 lb

## 2022-10-21 DIAGNOSIS — G3184 Mild cognitive impairment, so stated: Secondary | ICD-10-CM

## 2022-10-21 DIAGNOSIS — R296 Repeated falls: Secondary | ICD-10-CM | POA: Diagnosis not present

## 2022-10-21 DIAGNOSIS — M79605 Pain in left leg: Secondary | ICD-10-CM | POA: Diagnosis not present

## 2022-10-21 DIAGNOSIS — M545 Low back pain, unspecified: Secondary | ICD-10-CM | POA: Diagnosis not present

## 2022-10-21 DIAGNOSIS — D329 Benign neoplasm of meninges, unspecified: Secondary | ICD-10-CM

## 2022-10-21 DIAGNOSIS — T148XXA Other injury of unspecified body region, initial encounter: Secondary | ICD-10-CM | POA: Diagnosis not present

## 2022-10-21 NOTE — Patient Instructions (Addendum)
Consider purchasing Tiger Balm, Icy Hot to use when you are not using the Lidoderm patches.   Senakot 8.6 mg - take 2 tablets at night before going to bed.

## 2022-10-21 NOTE — Progress Notes (Unsigned)
Location:  [facility]   Place of Service:   [facility]  Provider: Unk Lightning  Code Status: Full Code Goals of Care:     10/17/2022    1:14 PM  Advanced Directives  Does Patient Have a Medical Advance Directive? Yes  Type of Advance Directive Living will  Does patient want to make changes to medical advance directive? No - Patient declined     Chief Complaint  Patient presents with   Acute Visit    Back Pain No better.     HPI: Patient is a 82 y.o. female seen today for an acute visit for persistent back pain after fall last weekend. Patient and spouse state they have continued to trial naproxen, tylenol, lidoderm patches and continues to have pain. She now has a bruise on the opposide side of her back. Spouse says he noticed it Monday night. Ms Mcanelly continues to say repeatedly, Perhaps the bruise occurred before the fall. Perhaps It was from a different fall. Spouse confirmed, he didn't see it before the appointment.     Past Medical History:  Diagnosis Date   Actinic keratosis 06/25/2020   L pretibia distal - bx proven    Balance problems    BCC (basal cell carcinoma of skin) 12/17/2020   right temple, Moh's 01/19/2021   Brain tumor ([place])    a. Left intraventricular tumor ->stable by 06/2017 MRI. Followed @ [facility].   Dysplastic nevus 06/25/2020   R flank - moderate   Gait disturbance    a. Unsteady on feet/balance difficulty.   Generalized osteoarthritis of multiple sites    GERD (gastroesophageal reflux disease)    History of DVT (deep vein thrombosis) 2016   estrogen and airflights. Rx with xarelto for 3 months   History of stress test    a. 11/2016 ETT: No ST/T changes.  Performed to assess PAC/PVC burden with activity ->No ectopy noted.   Hypertension    Hyperthyroidism    treated briefly in college   Insomnia    Obesity    PAF (paroxysmal atrial fibrillation) ([place])    a. 12/2016 Event Monitor: brief run of PAF-->CHA2DS2VASc = 5--> Xarelto.   Plantar  fasciitis    Retinal tear of left eye    Rosacea    SCC (squamous cell carcinoma) 08/12/2021   left chest treated with ED& C 04/06/22   Squamous cell carcinoma of skin 09/07/2017   R dorsum of hand    Type 2 diabetes, controlled, with peripheral neuropathy ([place])    a. 11/2016 A1c 7.4.   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   RETINAL TEAR REPAIR CRYOTHERAPY     10/09/13, then again 3/15    Allergies  Allergen Reactions   Penicillins     Has patient had a PCN reaction causing immediate rash, facial/tongue/throat swelling, SOB or lightheadedness with hypotension: Yes Has patient had a PCN reaction causing severe rash involving mucus membranes or skin necrosis: Yes Has patient had a PCN reaction that required hospitalization No Has patient had a PCN reaction occurring within the last 10 years: No If all of the above answers are "NO", then may proceed with Cephalosporin use.     Sulfa Antibiotics     Rash/itching   Azithromycin Palpitations  Outpatient Encounter Medications as of 10/21/2022  Medication Sig   diclofenac Sodium (VOLTAREN) 1 % GEL Apply topically 4 (four) times daily as needed.   fluticasone (VERAMYST) 27.5 MCG/SPRAY nasal spray Place 2 sprays into the nose daily as needed for allergies.    glimepiride (AMARYL) 4 MG tablet Take 1 tablet (4 mg total) by mouth daily before breakfast.   lidocaine (LIDODERM) 5 % Place 1 patch onto the skin daily. Remove & Discard patch within 12 hours or as directed by MD   losartan-hydrochlorothiazide (HYZAAR) 100-12.5 MG tablet Take 1 tablet by mouth daily.   metoprolol succinate (TOPROL-XL) 25 MG 24 hr tablet Take 1 tablet (25 mg total) by mouth daily.   Multiple Vitamin (MULTIVITAMIN) tablet Take 1 tablet by mouth daily.   mupirocin ointment  (BACTROBAN) 2 % Apply 1 application topically daily. With dressing changes   pioglitazone (ACTOS) 45 MG tablet Take 1 tablet (45 mg total) by mouth daily.   rivaroxaban (XARELTO) 20 MG TABS tablet Take 1 tablet (20 mg total) by mouth daily with supper.   No facility-administered encounter medications on file as of 10/21/2022.    Review of Systems:  Review of Systems  Health Maintenance  Topic Date Due   COVID-19 Vaccine (6 - 2023-24 season) 05/27/2022   HEMOGLOBIN A1C  08/04/2022   Diabetic kidney evaluation - Urine ACR  10/29/2022   Medicare Annual Wellness (AWV)  10/29/2022   DTaP/Tdap/Td (2 - Td or Tdap) 10/23/2022   FOOT EXAM  10/29/2022   OPHTHALMOLOGY EXAM  03/02/2023   Diabetic kidney evaluation - eGFR measurement  06/04/2023   Pneumonia Vaccine 1+ Years old  Completed   INFLUENZA VACCINE  Completed   DEXA SCAN  Completed   Zoster Vaccines- Shingrix  Completed   HPV VACCINES  Aged Out   MAMMOGRAM  Discontinued    Physical Exam: Vitals:   10/21/22 1511  BP: 134/78  Pulse: 77  Temp: 98.4 F (36.9 C)  SpO2: 96%  Weight: 194 lb (88 kg)  Height: '5\' 6"'$  (1.676 m)   Body mass index is 31.31 kg/m. Physical Exam Musculoskeletal:     Comments: Tenderness to palpation 1.5 cm lateral to midline on the left side between ~T10-L2.   Skin:    Comments: Bruise image below  Neurological:     Comments: Patient repeats aspect of history numerous times. Asks, do you think I fell more than once? Do you think this is from the same or a different fall.      Labs reviewed: Basic Metabolic Panel: Recent Labs    10/29/21 0959 06/03/22 1542  NA 138 142  K 3.8 4.1  CL 100 101  CO2 35* 25  GLUCOSE 198* 151*  BUN 18 22  CREATININE 0.87 1.02*  CALCIUM 9.2 9.5  TSH 2.25  --    Liver Function Tests: Recent Labs    10/29/21 0959 06/03/22 1542  AST 12 16  ALT 11 14  ALKPHOS 64  --   BILITOT 0.4 0.3  PROT 6.7 7.0  ALBUMIN 4.3  --    No results for input(s): "LIPASE",  "AMYLASE" in the last 8760 hours. No results for input(s): "AMMONIA" in the last 8760 hours. CBC: Recent Labs    10/29/21 0959 06/03/22 1542  WBC 6.8 8.0  NEUTROABS  --  5,912  HGB 12.2 12.2  HCT 36.4 36.2  MCV 87.0 87.7  PLT 218.0 232   Lipid Panel: Recent Labs    10/29/21 0959  CHOL 171  HDL 44.50  TRIG 210.0*  CHOLHDL 4  LDLDIRECT 104.0   Lab Results  Component Value Date   HGBA1C 7.3 (A) 02/01/2022    Procedures since last visit: DG HIP UNILAT WITH PELVIS 2-3 VIEWS LEFT  Result Date: 10/19/2022 CLINICAL DATA:  Fall with persistent hip pain. Fall 3 days ago. EXAM: DG HIP (WITH OR WITHOUT PELVIS) 2-3V LEFT COMPARISON:  Hip radiograph 07/10/2020 FINDINGS: No acute fracture or dislocation of the pelvis or left hip. There is minor bilateral hip osteoarthritis with peripheral spurring that is new from prior. The pubic rami and pelvis are intact. Pubic symphysis and sacroiliac joints are congruent with mild degenerative change. No visible sacral fracture no focal soft tissue abnormalities are seen. IMPRESSION: No fracture or dislocation of the pelvis or left hip. Minor bilateral hip osteoarthritis. Electronically Signed   By: Keith Rake M.D.   On: 10/19/2022 16:15    Assessment/Plan Low back pain radiating to left lower extremity  Multiple falls  Meningioma (HCC)  Mild cognitive impairment  Bruise Patient with continued pain despite conservaitve management. Hip x-ray negative. Some concern for paraspinal muscle strain based on tenderness lateral to the spine. New bruise noted on exam. Patient and spouse unable to determine where this is from. Plan to continue conservative management with multimodal pain control. Tiger balm. Rest. Heating pads. Advised pain can continue for 4-6 weeks.    Labs/tests ordered:  * No order type specified * Next appt:  11/07/2022

## 2022-11-01 ENCOUNTER — Telehealth: Payer: Self-pay | Admitting: *Deleted

## 2022-11-01 NOTE — Telephone Encounter (Signed)
Received Prior Authorization from CoverMyMeds for patient's Lidocaine Patches.   Initiated Prior Authorization through [place] request.   [id number] PACase: [id number] 09/26/2022-09/26/2023

## 2022-11-02 NOTE — Telephone Encounter (Signed)
Incoming fax received form patients pharmacy stating PA for Lidocaine 5 % patch approved through 09/26/2023. Outgoing call placed to patients pharmacy to inform them.

## 2022-11-07 ENCOUNTER — Ambulatory Visit: Payer: Medicare Other | Admitting: Student

## 2022-11-07 ENCOUNTER — Encounter: Payer: Self-pay | Admitting: Student

## 2022-11-07 VITALS — BP 140/70 | Temp 98.2°F | Ht 66.0 in

## 2022-11-07 DIAGNOSIS — F03B0 Unspecified dementia, moderate, without behavioral disturbance, psychotic disturbance, mood disturbance, and anxiety: Secondary | ICD-10-CM

## 2022-11-07 NOTE — Progress Notes (Signed)
Location:  [facility]  Provider: Reuel Lamadrid  Code Status: Full Code Goals of Care:     10/17/2022    1:14 PM  Advanced Directives  Does Patient Have a Medical Advance Directive? Yes  Type of Advance Directive Living will  Does patient want to make changes to medical advance directive? No - Patient declined     Chief Complaint  Patient presents with   Acute Visit    Follow up visit. Pain has improved. Patient's pain patch was not approved by insurance until 2 days ago. Patient has been using OTC 4% patch.    HPI: Patient is a 82 y.o. female seen today for medical management of chronic diseases.  Her back is feeling much better. She has short term memory issues and she spends more time sitting around and watching television than anything else.    Past Medical History:  Diagnosis Date   Actinic keratosis 06/25/2020   L pretibia distal - bx proven    Balance problems    BCC (basal cell carcinoma of skin) 12/17/2020   right temple, Moh's 01/19/2021   Brain tumor ([place])    a. Left intraventricular tumor ->stable by 06/2017 MRI. Followed @ [facility].   Dysplastic nevus 06/25/2020   R flank - moderate   Gait disturbance    a. Unsteady on feet/balance difficulty.   Generalized osteoarthritis of multiple sites    GERD (gastroesophageal reflux disease)    History of DVT (deep vein thrombosis) 2016   estrogen and airflights. Rx with xarelto for 3 months   History of stress test    a. 11/2016 ETT: No ST/T changes.  Performed to assess PAC/PVC burden with activity ->No ectopy noted.   Hypertension    Hyperthyroidism    treated briefly in college   Insomnia    Obesity    PAF (paroxysmal atrial fibrillation) ([place])    a. 12/2016 Event Monitor: brief run of PAF-->CHA2DS2VASc = 5--> Xarelto.   Plantar fasciitis    Retinal tear of left eye    Rosacea    SCC (squamous cell carcinoma) 08/12/2021   left chest treated with ED& C 04/06/22   Squamous cell carcinoma of skin 09/07/2017   R dorsum  of hand    Type 2 diabetes, controlled, with peripheral neuropathy ([place])    a. 11/2016 A1c 7.4.   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   RETINAL TEAR REPAIR CRYOTHERAPY     10/09/13, then again 3/15    Allergies  Allergen Reactions   Penicillins     Has patient had a PCN reaction causing immediate rash, facial/tongue/throat swelling, SOB or lightheadedness with hypotension: Yes Has patient had a PCN reaction causing severe rash involving mucus membranes or skin necrosis: Yes Has patient had a PCN reaction that required hospitalization No Has patient had a PCN reaction occurring within the last 10 years: No If all of the above answers are "NO", then may proceed with Cephalosporin use.     Sulfa Antibiotics     Rash/itching   Azithromycin Palpitations    Outpatient Encounter Medications as of 11/07/2022  Medication Sig   diclofenac Sodium (VOLTAREN) 1 % GEL Apply topically 4 (four) times daily as needed.   fluticasone (VERAMYST) 27.5 MCG/SPRAY nasal spray Place 2 sprays into  the nose daily as needed for allergies.    glimepiride (AMARYL) 4 MG tablet Take 1 tablet (4 mg total) by mouth daily before breakfast.   losartan-hydrochlorothiazide (HYZAAR) 100-12.5 MG tablet Take 1 tablet by mouth daily.   metoprolol succinate (TOPROL-XL) 25 MG 24 hr tablet Take 1 tablet (25 mg total) by mouth daily.   Multiple Vitamin (MULTIVITAMIN) tablet Take 1 tablet by mouth daily.   mupirocin ointment (BACTROBAN) 2 % Apply 1 application topically daily. With dressing changes   pioglitazone (ACTOS) 45 MG tablet Take 1 tablet (45 mg total) by mouth daily.   rivaroxaban (XARELTO) 20 MG TABS tablet Take 1 tablet (20 mg total) by mouth daily with supper.   lidocaine (LIDODERM) 5 % Place 1 patch onto the skin daily.  Remove & Discard patch within 12 hours or as directed by MD (Patient not taking: Reported on 11/07/2022)   No facility-administered encounter medications on file as of 11/07/2022.    Review of Systems:  Review of Systems  All other systems reviewed and are negative.   Health Maintenance  Topic Date Due   COVID-19 Vaccine (6 - 2023-24 season) 05/27/2022   HEMOGLOBIN A1C  08/04/2022   DTaP/Tdap/Td (2 - Td or Tdap) 10/23/2022   Diabetic kidney evaluation - Urine ACR  10/29/2022   Medicare Annual Wellness (AWV)  10/29/2022   FOOT EXAM  10/29/2022   OPHTHALMOLOGY EXAM  03/02/2023   Diabetic kidney evaluation - eGFR measurement  06/04/2023   Pneumonia Vaccine 23+ Years old  Completed   INFLUENZA VACCINE  Completed   DEXA SCAN  Completed   Zoster Vaccines- Shingrix  Completed   HPV VACCINES  Aged Out   MAMMOGRAM  Discontinued    Physical Exam: Vitals:   11/07/22 1310  BP: (!) 140/70  Temp: 98.2 F (36.8 C)  Height: 5' 6"$  (1.676 m)   Body mass index is 31.31 kg/m. Physical Exam Vitals reviewed.  Cardiovascular:     Rate and Rhythm: Normal rate.     Pulses: Normal pulses.  Pulmonary:     Effort: Pulmonary effort is normal.  Skin:    General: Skin is warm and dry.  Neurological:     Mental Status: She is alert.     Comments: Patient's [facility]UMS today 13/30     Labs reviewed: Basic Metabolic Panel: Recent Labs    06/03/22 1542  NA 142  K 4.1  CL 101  CO2 25  GLUCOSE 151*  BUN 22  CREATININE 1.02*  CALCIUM 9.5   Liver Function Tests: Recent Labs    06/03/22 1542  AST 16  ALT 14  BILITOT 0.3  PROT 7.0   No results for input(s): "LIPASE", "AMYLASE" in the last 8760 hours. No results for input(s): "AMMONIA" in the last 8760 hours. CBC: Recent Labs    06/03/22 1542  WBC 8.0  NEUTROABS 5,912  HGB 12.2  HCT 36.2  MCV 87.7  PLT 232   Lipid Panel: No results for input(s): "CHOL", "HDL", "LDLCALC", "TRIG", "CHOLHDL", "LDLDIRECT" in the last 8760  hours. Lab Results  Component Value Date   HGBA1C 7.3 (A) 02/01/2022    Procedures since last visit: DG HIP UNILAT WITH PELVIS 2-3 VIEWS LEFT  Result Date: 10/19/2022 CLINICAL DATA:  Fall with persistent hip pain. Fall 3 days ago. EXAM: DG HIP (WITH OR WITHOUT PELVIS) 2-3V LEFT COMPARISON:  Hip radiograph 07/10/2020 FINDINGS: No acute fracture or dislocation of the pelvis or left hip. There is minor bilateral hip osteoarthritis  with peripheral spurring that is new from prior. The pubic rami and pelvis are intact. Pubic symphysis and sacroiliac joints are congruent with mild degenerative change. No visible sacral fracture no focal soft tissue abnormalities are seen. IMPRESSION: No fracture or dislocation of the pelvis or left hip. Minor bilateral hip osteoarthritis. Electronically Signed   By: Keith Rake M.D.   On: 10/19/2022 16:15    Assessment/Plan Moderate dementia without behavioral disturbance, psychotic disturbance, mood disturbance, or anxiety, unspecified dementia type ([place])  SLUMS today concerning for progression of memory changes - most notably difference in ability to write a clock. Discussed concern that memory has progressed. They are planning a second tour of memory care in the near future. Contacted LCSW on campus of concern for memory changes. F/u in 3 mo.  Labs/tests ordered:  * No order type specified * Next appt:  Visit date not found

## 2022-11-07 NOTE — Patient Instructions (Addendum)
You should hear back from the physical therapist in the next 1-2 weeks to schedule a time.   Great seeing you both!  IF you can attend the open house for [facility], I think it would be great!

## 2022-11-08 ENCOUNTER — Telehealth: Payer: Self-pay | Admitting: *Deleted

## 2022-11-08 NOTE — Telephone Encounter (Signed)
Angela Bentley, Husband called and stated that they were NOT sure why you ordered PT. Stated that her back pain is much better and the pain is not there anymore.   Stated that if you want her to do PT she does not want to go through [facility] but rather to [place]  Please Advise.

## 2022-11-09 NOTE — Telephone Encounter (Signed)
Dewayne Shorter, MD  You14 hours ago (8:41 PM)   VB I did not order Physical Therapy. This message was written in the AVS before she declined it. Based on our conversation, I did not order it for them. Will see them in 3 months for follow up.      Tried calling patient. LMOM to return call.

## 2022-11-11 NOTE — Telephone Encounter (Signed)
Tried Calling patient, LMOM to return call.

## 2023-01-12 DIAGNOSIS — Z23 Encounter for immunization: Secondary | ICD-10-CM | POA: Diagnosis not present

## 2023-01-20 ENCOUNTER — Ambulatory Visit: Payer: Medicare Other | Admitting: Student

## 2023-01-20 ENCOUNTER — Encounter: Payer: Self-pay | Admitting: Student

## 2023-01-20 VITALS — BP 126/82 | HR 80 | Temp 98.4°F | Ht 66.0 in | Wt 207.0 lb

## 2023-01-20 DIAGNOSIS — E1142 Type 2 diabetes mellitus with diabetic polyneuropathy: Secondary | ICD-10-CM

## 2023-01-20 DIAGNOSIS — I700 Atherosclerosis of aorta: Secondary | ICD-10-CM | POA: Insufficient documentation

## 2023-01-20 DIAGNOSIS — H00022 Hordeolum internum right lower eyelid: Secondary | ICD-10-CM | POA: Diagnosis not present

## 2023-01-20 DIAGNOSIS — I480 Paroxysmal atrial fibrillation: Secondary | ICD-10-CM

## 2023-01-20 NOTE — Progress Notes (Signed)
Location:  [facility]   Place of Service:   [facility]  Provider: Kensington Rios  Code Status: Full Goals of Care:     01/20/2023   10:12 AM  Advanced Directives  Does Patient Have a Medical Advance Directive? Yes  Type of Advance Directive Living will  Does patient want to make changes to medical advance directive? No - Patient declined     Chief Complaint  Patient presents with   Acute Visit    Stye on right eye x 2-3 weeks     HPI: Patient is a 82 y.o. female seen today for an acute visit for stye on the eye. Initially was doing warm compresses, however, stopped. She hasn't noticed it in the last few days, but daughter came to visit and has concerned. She says it's been about the same size the entire. No pain really. No drainage. No fevers, chills, etc.   Her daughter is here and Devona Konig.     Past Medical History:  Diagnosis Date   Actinic keratosis 06/25/2020   L pretibia distal - bx proven    Balance problems    BCC (basal cell carcinoma of skin) 12/17/2020   right temple, Moh's 01/19/2021   Brain tumor ([facility])    a. Left intraventricular tumor ->stable by 06/2017 MRI. Followed @ [facility].   Dysplastic nevus 06/25/2020   R flank - moderate   Gait disturbance    a. Unsteady on feet/balance difficulty.   Generalized osteoarthritis of multiple sites    GERD (gastroesophageal reflux disease)    History of DVT (deep vein thrombosis) 2016   estrogen and airflights. Rx with xarelto for 3 months   History of stress test    a. 11/2016 ETT: No ST/T changes.  Performed to assess PAC/PVC burden with activity ->No ectopy noted.   Hypertension    Hyperthyroidism    treated briefly in college   Insomnia    Obesity    PAF (paroxysmal atrial fibrillation) ([facility])    a. 12/2016 Event Monitor: brief run of PAF-->CHA2DS2VASc = 5--> Xarelto.   Plantar fasciitis    Retinal tear of left eye    Rosacea    SCC (squamous cell carcinoma) 08/12/2021   left chest treated with ED& C  04/06/22   Squamous cell carcinoma of skin 09/07/2017   R dorsum of hand    Type 2 diabetes, controlled, with peripheral neuropathy ([facility])    a. 11/2016 A1c 7.4.   Uterine fibroid     Past Surgical History:  Procedure Laterality Date   CARPAL TUNNEL RELEASE Right    Dr Mina Marble   CATARACT EXTRACTION     Gamma knife  07/2020   intraventricular mass---meningioma or choroid plexus papilloma   RETINAL TEAR REPAIR CRYOTHERAPY     10/09/13, then again 3/15    Allergies  Allergen Reactions   Penicillins     Has patient had a PCN reaction causing immediate rash, facial/tongue/throat swelling, SOB or lightheadedness with hypotension: Yes Has patient had a PCN reaction causing severe rash involving mucus membranes or skin necrosis: Yes Has patient had a PCN reaction that required hospitalization No Has patient had a PCN reaction occurring within the last 10 years: No If all of the above answers are "NO", then may proceed with Cephalosporin use.     Sulfa Antibiotics     Rash/itching   Azithromycin Palpitations    Outpatient Encounter Medications as of 01/20/2023  Medication Sig   diclofenac Sodium (VOLTAREN) 1 %  GEL Apply topically 4 (four) times daily as needed.   fluticasone (VERAMYST) 27.5 MCG/SPRAY nasal spray Place 2 sprays into the nose daily as needed for allergies.    glimepiride (AMARYL) 4 MG tablet Take 1 tablet (4 mg total) by mouth daily before breakfast.   lidocaine (LIDODERM) 5 % Place 1 patch onto the skin daily. Remove & Discard patch within 12 hours or as directed by MD   losartan-hydrochlorothiazide (HYZAAR) 100-12.5 MG tablet Take 1 tablet by mouth daily.   metoprolol succinate (TOPROL-XL) 25 MG 24 hr tablet Take 1 tablet (25 mg total) by mouth daily.   Multiple Vitamin (MULTIVITAMIN) tablet Take 1 tablet by mouth daily.   pioglitazone (ACTOS) 45 MG tablet Take 1 tablet (45 mg total) by mouth daily.   rivaroxaban (XARELTO) 20 MG TABS tablet Take 1 tablet (20 mg total)  by mouth daily with supper.   [DISCONTINUED] mupirocin ointment (BACTROBAN) 2 % Apply 1 application topically daily. With dressing changes   No facility-administered encounter medications on file as of 01/20/2023.    Review of Systems:  Review of Systems  Health Maintenance  Topic Date Due   COVID-19 Vaccine (6 - 2023-24 season) 05/27/2022   HEMOGLOBIN A1C  08/04/2022   DTaP/Tdap/Td (2 - Td or Tdap) 10/23/2022   Diabetic kidney evaluation - Urine ACR  10/29/2022   FOOT EXAM  10/29/2022   Medicare Annual Wellness (AWV)  10/29/2022   OPHTHALMOLOGY EXAM  03/02/2023   INFLUENZA VACCINE  04/27/2023   Diabetic kidney evaluation - eGFR measurement  06/04/2023   Pneumonia Vaccine 8+ Years old  Completed   DEXA SCAN  Completed   Zoster Vaccines- Shingrix  Completed   HPV VACCINES  Aged Out   MAMMOGRAM  Discontinued    Physical Exam: Vitals:   01/20/23 1010  BP: 126/82  Pulse: 80  Temp: 98.4 F (36.9 C)  TempSrc: Temporal  SpO2: 99%  Weight: 207 lb (93.9 kg)  Height: 5\' 6"  (1.676 m)   Body mass index is 33.41 kg/m. Physical Exam Constitutional:      Appearance: Normal appearance.  Eyes:     Comments: Right lower lid with erythematous papule.   Neurological:     Mental Status: She is alert.     Labs reviewed: Basic Metabolic Panel: Recent Labs    06/03/22 1542  NA 142  K 4.1  CL 101  CO2 25  GLUCOSE 151*  BUN 22  CREATININE 1.02*  CALCIUM 9.5   Liver Function Tests: Recent Labs    06/03/22 1542  AST 16  ALT 14  BILITOT 0.3  PROT 7.0   No results for input(s): "LIPASE", "AMYLASE" in the last 8760 hours. No results for input(s): "AMMONIA" in the last 8760 hours. CBC: Recent Labs    06/03/22 1542  WBC 8.0  NEUTROABS 5,912  HGB 12.2  HCT 36.2  MCV 87.7  PLT 232   Lipid Panel: No results for input(s): "CHOL", "HDL", "LDLCALC", "TRIG", "CHOLHDL", "LDLDIRECT" in the last 8760 hours. Lab Results  Component Value Date   HGBA1C 7.3 (A) 02/01/2022     Procedures since last visit: No results found.  Assessment/Plan Hordeolum internum of right lower eyelid Recommend conservative management with warm compresses 4x per day until resolution. Encouraged family to make an appointment with ophthalmology in case there is no resolution given it has been 2 weeks. Discussed poor resolution with antibiotics and deferred at this time.    Labs/tests ordered:  * No order type specified *  Next appt:  02/06/2023

## 2023-01-20 NOTE — Patient Instructions (Addendum)
Please contact your eye doctor to discuss recurrent stye of the eye: [phone] Care instructions:  Take over-the-counter and prescription medicines only as told by your health care provider. This includes eye drops or ointments. Apply a warm, wet cloth (warm compress) to your eye for 5-10 minutes, 4 to 6 times a day. Clean the affected eyelid as directed by your health care provider. Do not wear contact lenses or eye makeup until your stye has healed and your health care provider says that it is safe. Do not try to pop or drain the stye.

## 2023-02-01 DIAGNOSIS — H0012 Chalazion right lower eyelid: Secondary | ICD-10-CM | POA: Diagnosis not present

## 2023-02-06 ENCOUNTER — Ambulatory Visit: Payer: Medicare Other | Admitting: Student

## 2023-02-06 ENCOUNTER — Encounter: Payer: Self-pay | Admitting: Student

## 2023-02-06 VITALS — BP 150/86 | HR 82 | Temp 97.3°F | Ht 66.0 in | Wt 206.0 lb

## 2023-02-06 DIAGNOSIS — I480 Paroxysmal atrial fibrillation: Secondary | ICD-10-CM | POA: Diagnosis not present

## 2023-02-06 DIAGNOSIS — D329 Benign neoplasm of meninges, unspecified: Secondary | ICD-10-CM | POA: Diagnosis not present

## 2023-02-06 DIAGNOSIS — I10 Essential (primary) hypertension: Secondary | ICD-10-CM | POA: Diagnosis not present

## 2023-02-06 DIAGNOSIS — E1142 Type 2 diabetes mellitus with diabetic polyneuropathy: Secondary | ICD-10-CM

## 2023-02-06 DIAGNOSIS — E538 Deficiency of other specified B group vitamins: Secondary | ICD-10-CM | POA: Diagnosis not present

## 2023-02-06 DIAGNOSIS — G3184 Mild cognitive impairment, so stated: Secondary | ICD-10-CM | POA: Diagnosis not present

## 2023-02-06 DIAGNOSIS — E0590 Thyrotoxicosis, unspecified without thyrotoxic crisis or storm: Secondary | ICD-10-CM

## 2023-02-06 DIAGNOSIS — R296 Repeated falls: Secondary | ICD-10-CM | POA: Diagnosis not present

## 2023-02-06 NOTE — Progress Notes (Signed)
Location:  [facility].   Provider: Dr. [name]  Code Status: Full Code.  Goals of Care:     02/06/2023    1:06 PM  Advanced Directives  Does Patient Have a Medical Advance Directive? Yes  Type of Advance Directive Living will  Does patient want to make changes to medical advance directive? No - Patient declined     Chief Complaint  Patient presents with   Medical Management of Chronic Issues    Medical Management of Chronic Issues.    Quality Metric Gaps    To discuss need for Hgb A1c, Tdap,Diabetic Kidney Evaluation, Foot Exam and AWV or postpone if patient refuses.     HPI: Patient is a 82 y.o. female seen today for medical management of chronic diseases.    Hordeolum is still present- smaller, but still red. She is rubbing the lower lid with an antimicrobial QID but they do it 1-2 times per day. No warm or cold compresses at this time.   Meningioma - they have an MRI on 6/13 and they will have a read and evaluation for what has happened since the last gamma knife.   Diabetes - Last A1c was 01/2022.   Recurrent falls- no recent falls. Graduated physical therapy.   Hypertension - they don't remember if she had her medications this morning.   Xarelto - no signs of bleeding.   Needs a derm exam  Eye exam is in June.  Past Medical History:  Diagnosis Date   Actinic keratosis 06/25/2020   L pretibia distal - bx proven    Balance problems    BCC (basal cell carcinoma of skin) 12/17/2020   right temple, Moh's 01/19/2021   Brain tumor (HCC)    a. Left intraventricular tumor ->stable by 06/2017 MRI. Followed @ [facility].   Dysplastic nevus 06/25/2020   R flank - moderate   Gait disturbance    a. Unsteady on feet/balance difficulty.   Generalized osteoarthritis of multiple sites    GERD (gastroesophageal reflux disease)    History of DVT (deep vein thrombosis) 2016   estrogen and airflights. Rx with xarelto for 3 months   History of stress test    a.  11/2016 ETT: No ST/T changes.  Performed to assess PAC/PVC burden with activity ->No ectopy noted.   Hypertension    Hyperthyroidism    treated briefly in college   Insomnia    Obesity    PAF (paroxysmal atrial fibrillation) (HCC)    a. 12/2016 Event Monitor: brief run of PAF-->CHA2DS2VASc = 5--> Xarelto.   Plantar fasciitis    Retinal tear of left eye    Rosacea    SCC (squamous cell carcinoma) 08/12/2021   left chest treated with ED& C 04/06/22   Squamous cell carcinoma of skin 09/07/2017   R dorsum of hand    Type 2 diabetes, controlled, with peripheral neuropathy (HCC)    a. 11/2016 A1c 7.4.   Uterine fibroid     Past Surgical History:  Procedure Laterality Date   CARPAL TUNNEL RELEASE Right    Dr Mina Marble   CATARACT EXTRACTION     Gamma knife  07/2020   intraventricular mass---meningioma or choroid plexus papilloma   RETINAL TEAR REPAIR CRYOTHERAPY     10/09/13, then again 3/15    Allergies  Allergen Reactions   Penicillins     Has patient had a PCN reaction causing immediate rash, facial/tongue/throat swelling, SOB or lightheadedness with hypotension: Yes Has patient had  a PCN reaction causing severe rash involving mucus membranes or skin necrosis: Yes Has patient had a PCN reaction that required hospitalization No Has patient had a PCN reaction occurring within the last 10 years: No If all of the above answers are "NO", then may proceed with Cephalosporin use.     Sulfa Antibiotics     Rash/itching   Azithromycin Palpitations    Outpatient Encounter Medications as of 02/06/2023  Medication Sig   diclofenac Sodium (VOLTAREN) 1 % GEL Apply topically 4 (four) times daily as needed.   fluticasone (VERAMYST) 27.5 MCG/SPRAY nasal spray Place 2 sprays into the nose daily as needed for allergies.    glimepiride (AMARYL) 4 MG tablet Take 1 tablet (4 mg total) by mouth daily before breakfast.   lidocaine (LIDODERM) 5 % Place 1 patch onto the skin daily. Remove & Discard  patch within 12 hours or as directed by MD   losartan-hydrochlorothiazide (HYZAAR) 100-12.5 MG tablet Take 1 tablet by mouth daily.   metoprolol succinate (TOPROL-XL) 25 MG 24 hr tablet Take 1 tablet (25 mg total) by mouth daily.   Multiple Vitamin (MULTIVITAMIN) tablet Take 1 tablet by mouth daily.   pioglitazone (ACTOS) 45 MG tablet Take 1 tablet (45 mg total) by mouth daily.   rivaroxaban (XARELTO) 20 MG TABS tablet Take 1 tablet (20 mg total) by mouth daily with supper.   No facility-administered encounter medications on file as of 02/06/2023.    Review of Systems:  Review of Systems  All other systems reviewed and are negative.   Health Maintenance  Topic Date Due   HEMOGLOBIN A1C  08/04/2022   DTaP/Tdap/Td (2 - Td or Tdap) 10/23/2022   Diabetic kidney evaluation - Urine ACR  10/29/2022   Medicare Annual Wellness (AWV)  10/29/2022   OPHTHALMOLOGY EXAM  03/02/2023   COVID-19 Vaccine (9 - 2023-24 season) 03/09/2023   INFLUENZA VACCINE  04/27/2023   Diabetic kidney evaluation - eGFR measurement  06/04/2023   FOOT EXAM  02/06/2024   Pneumonia Vaccine 49+ Years old  Completed   DEXA SCAN  Completed   Zoster Vaccines- Shingrix  Completed   HPV VACCINES  Aged Out   MAMMOGRAM  Discontinued    Physical Exam: Vitals:   02/06/23 1302 02/06/23 1307  BP: (!) 158/88 (!) 150/86  Pulse: 82   Temp: (!) 97.3 F (36.3 C)   SpO2: 96%   Weight: 206 lb (93.4 kg)   Height: 5\' 6"  (1.676 m)    Body mass index is 33.25 kg/m. Physical Exam Constitutional:      Appearance: Normal appearance.  Cardiovascular:     Rate and Rhythm: Normal rate and regular rhythm.     Pulses: Normal pulses.          Dorsalis pedis pulses are 2+ on the right side and 2+ on the left side.       Posterior tibial pulses are 2+ on the right side and 2+ on the left side.     Heart sounds: Normal heart sounds.  Pulmonary:     Effort: Pulmonary effort is normal.     Breath sounds: Normal breath sounds.   Abdominal:     General: Abdomen is flat. Bowel sounds are normal.     Palpations: Abdomen is soft.  Musculoskeletal:     Right foot: No deformity, bunion or Charcot foot.     Left foot: No deformity, bunion or Charcot foot.  Feet:     Right foot:  Protective Sensation: 5 sites tested.  5 sites sensed.     Skin integrity: Skin integrity normal.     Toenail Condition: Right toenails are normal.     Left foot:     Protective Sensation: 5 sites tested.  5 sites sensed.     Skin integrity: Skin integrity normal.     Toenail Condition: Left toenails are normal.  Skin:    General: Skin is warm and dry.  Neurological:     General: No focal deficit present.     Mental Status: She is alert. Mental status is at baseline.     Labs reviewed: Basic Metabolic Panel: Recent Labs    06/03/22 1542  NA 142  K 4.1  CL 101  CO2 25  GLUCOSE 151*  BUN 22  CREATININE 1.02*  CALCIUM 9.5   Liver Function Tests: Recent Labs    06/03/22 1542  AST 16  ALT 14  BILITOT 0.3  PROT 7.0   No results for input(s): "LIPASE", "AMYLASE" in the last 8760 hours. No results for input(s): "AMMONIA" in the last 8760 hours. CBC: Recent Labs    06/03/22 1542  WBC 8.0  NEUTROABS 5,912  HGB 12.2  HCT 36.2  MCV 87.7  PLT 232   Lipid Panel: No results for input(s): "CHOL", "HDL", "LDLCALC", "TRIG", "CHOLHDL", "LDLDIRECT" in the last 8760 hours. Lab Results  Component Value Date   HGBA1C 7.3 (A) 02/01/2022    Procedures since last visit: No results found.  Assessment/Plan Type 2 diabetes, controlled, with peripheral neuropathy (HCC) - Plan: Hemoglobin A1c, Basic Metabolic Panel with eGFR, CBC With Differential/Platelet, Lipid Panel, Microalbumin/Creatinine Ratio, Urine  Primary hypertension  PAF (paroxysmal atrial fibrillation) (HCC)  Multiple falls  Mild cognitive impairment  Meningioma (HCC)  Hyperthyroidism - Plan: TSH  Vitamin B12 deficiency - Plan: Vitamin B12, CANCELED:  Vitamin B1 Patient is doing well -- no recent falls. She has follow up MRI in 1 month to evaluate meningioma. Balance training complete, however continues to be undsteady. Discussed the importance of BP medication adherence, it seems as though patient and spouse are having trouble remembering events of the day - partly because of visitors in the home partly due to memory deficits. She is followed by social work who often offers supportive measures on campus. Asymptomatic from PAF, will continue xarelto. DM labs as outlined above, will adjust medication regimen as indicated. Continue Actos and Amaryl. Continue Hyzaar and toprol - rate controlled.   Labs/tests ordered:  * No order type specified * Next appt:  Visit date not found   I spent greater than 35 minutes for the care of this patient in face to face time, chart review, clinical documentation, patient education.

## 2023-02-06 NOTE — Patient Instructions (Addendum)
It was great seeing you today!  Please come to the clinic at 7:30 AM on Monday, May 20 to get blood collected.   Please call and schedule an annual skin exam with dermatology as soon as possible.   Please get your tetanus shot at your local pharmacy.

## 2023-02-13 ENCOUNTER — Telehealth: Payer: Self-pay | Admitting: Student

## 2023-02-13 ENCOUNTER — Encounter: Payer: Self-pay | Admitting: Student

## 2023-02-13 ENCOUNTER — Other Ambulatory Visit: Payer: Self-pay | Admitting: Student

## 2023-02-13 DIAGNOSIS — E1142 Type 2 diabetes mellitus with diabetic polyneuropathy: Secondary | ICD-10-CM | POA: Diagnosis not present

## 2023-02-13 DIAGNOSIS — E538 Deficiency of other specified B group vitamins: Secondary | ICD-10-CM | POA: Diagnosis not present

## 2023-02-13 DIAGNOSIS — R7989 Other specified abnormal findings of blood chemistry: Secondary | ICD-10-CM

## 2023-02-13 DIAGNOSIS — E0590 Thyrotoxicosis, unspecified without thyrotoxic crisis or storm: Secondary | ICD-10-CM | POA: Diagnosis not present

## 2023-02-13 LAB — CBC WITH DIFFERENTIAL/PLATELET
Basophils Relative: 0.9 %
HCT: 36 % (ref 35.0–45.0)
MPV: 10.8 fL (ref 7.5–12.5)
Neutro Abs: 4894 cells/uL (ref 1500–7800)
RBC: 4.09 10*6/uL (ref 3.80–5.10)
RDW: 12.9 % (ref 11.0–15.0)
Total Lymphocyte: 25 %

## 2023-02-13 LAB — VITAMIN B12: Vitamin B-12: 505 pg/mL (ref 200–1100)

## 2023-02-13 LAB — LIPID PANEL
HDL: 43 mg/dL — ABNORMAL LOW (ref >=50)
Non-HDL Cholesterol (Calc): 124 mg/dL (calc) (ref <130)

## 2023-02-13 LAB — BASIC METABOLIC PANEL WITH GFR
BUN: 13 mg/dL (ref 7–25)
CO2: 29 mmol/L (ref 20–32)

## 2023-02-13 LAB — TSH: TSH: 5.08 mIU/L — ABNORMAL HIGH (ref 0.40–4.50)

## 2023-02-13 NOTE — Telephone Encounter (Signed)
Called patient to discuss most recent lab results, left voicemail. Non urgent. Will send letter.

## 2023-02-13 NOTE — Progress Notes (Signed)
Lab order for repeat labs on March 27, 2023.

## 2023-02-14 LAB — CBC WITH DIFFERENTIAL/PLATELET
Absolute Monocytes: 509 cells/uL (ref 200–950)
Basophils Absolute: 68 cells/uL (ref 0–200)
Eosinophils Absolute: 228 cells/uL (ref 15–500)
Eosinophils Relative: 3 %
Hemoglobin: 11.9 g/dL (ref 11.7–15.5)
Lymphs Abs: 1900 cells/uL (ref 850–3900)
MCH: 29.1 pg (ref 27.0–33.0)
MCHC: 33.1 g/dL (ref 32.0–36.0)
MCV: 88 fL (ref 80.0–100.0)
Monocytes Relative: 6.7 %
Neutrophils Relative %: 64.4 %
Platelets: 229 10*3/uL (ref 140–400)
WBC: 7.6 10*3/uL (ref 3.8–10.8)

## 2023-02-14 LAB — LIPID PANEL
Cholesterol: 167 mg/dL (ref <200)
LDL Cholesterol (Calc): 88 mg/dL (calc)
Total CHOL/HDL Ratio: 3.9 (calc) (ref <5.0)
Triglycerides: 302 mg/dL — ABNORMAL HIGH (ref <150)

## 2023-02-14 LAB — MICROALBUMIN / CREATININE URINE RATIO
Creatinine, Urine: 100 mg/dL (ref 20–275)
Microalb Creat Ratio: 5 mg/g creat (ref <30)
Microalb, Ur: 0.5 mg/dL

## 2023-02-14 LAB — BASIC METABOLIC PANEL WITH GFR
Calcium: 9.3 mg/dL (ref 8.6–10.4)
Chloride: 101 mmol/L (ref 98–110)
Creat: 0.84 mg/dL (ref 0.60–0.95)
Glucose, Bld: 139 mg/dL — ABNORMAL HIGH (ref 65–99)
Potassium: 4.1 mmol/L (ref 3.5–5.3)
Sodium: 139 mmol/L (ref 135–146)
eGFR: 69 mL/min/{1.73_m2} (ref >=60)

## 2023-02-14 LAB — HEMOGLOBIN A1C
Hgb A1c MFr Bld: 8.1 % of total Hgb — ABNORMAL HIGH (ref <5.7)
Mean Plasma Glucose: 186 mg/dL
eAG (mmol/L): 10.3 mmol/L

## 2023-02-14 NOTE — Progress Notes (Signed)
Lab order Printed and placed in Tray at [facility] for [employer].

## 2023-02-28 ENCOUNTER — Encounter: Payer: Self-pay | Admitting: Nurse Practitioner

## 2023-02-28 ENCOUNTER — Ambulatory Visit (INDEPENDENT_AMBULATORY_CARE_PROVIDER_SITE_OTHER): Payer: Medicare Other | Admitting: Nurse Practitioner

## 2023-02-28 VITALS — BP 154/90 | HR 83 | Temp 97.3°F | Ht 66.0 in | Wt 206.0 lb

## 2023-02-28 DIAGNOSIS — Z0000 Encounter for general adult medical examination without abnormal findings: Secondary | ICD-10-CM | POA: Diagnosis not present

## 2023-02-28 DIAGNOSIS — E2839 Other primary ovarian failure: Secondary | ICD-10-CM

## 2023-02-28 NOTE — Progress Notes (Signed)
Subjective:   Nel Havner is a 82 y.o. female who presents for Medicare Annual (Subsequent) preventive examination.  Review of Systems     Cardiac Risk Factors include: smoking/ tobacco exposure;advanced age (>47men, >8 women);hypertension;diabetes mellitus;sedentary lifestyle;obesity (BMI >30kg/m2)     Objective:    Today's Vitals   02/28/23 0943 02/28/23 0954  BP: (!) 162/96 (!) 154/90  Pulse: 83   Temp: (!) 97.3 F (36.3 C)   SpO2: 95%   Weight: 206 lb (93.4 kg)   Height: 5\' 6"  (1.676 m)    Body mass index is 33.25 kg/m.     02/28/2023    9:45 AM 02/06/2023    1:06 PM 01/20/2023   10:12 AM 10/17/2022    1:14 PM 08/03/2022   12:28 PM 08/24/2020   11:20 AM 12/23/2019   11:21 AM  Advanced Directives  Does Patient Have a Medical Advance Directive? Yes Yes Yes Yes Yes Yes Yes  Type of Advance Directive Living will Living will Living will Living will Living will Healthcare Power of Attorney;Living will Healthcare Power of [place];Living will  Does patient want to make changes to medical advance directive? No - Patient declined No - Patient declined No - Patient declined No - Patient declined No - Patient declined No - Patient declined No - Patient declined  Copy of Healthcare Power of Attorney in Chart?      No - copy requested No - copy requested    Current Medications (verified) Outpatient Encounter Medications as of 02/28/2023  Medication Sig   glimepiride (AMARYL) 4 MG tablet Take 1 tablet (4 mg total) by mouth daily before breakfast.   losartan-hydrochlorothiazide (HYZAAR) 100-12.5 MG tablet Take 1 tablet by mouth daily.   metoprolol succinate (TOPROL-XL) 25 MG 24 hr tablet Take 1 tablet (25 mg total) by mouth daily.   Multiple Vitamin (MULTIVITAMIN) tablet Take 1 tablet by mouth daily.   pioglitazone (ACTOS) 45 MG tablet Take 1 tablet (45 mg total) by mouth daily.   rivaroxaban (XARELTO) 20 MG TABS tablet Take 1 tablet (20 mg total) by mouth daily with supper.    [DISCONTINUED] diclofenac Sodium (VOLTAREN) 1 % GEL Apply topically 4 (four) times daily as needed.   [DISCONTINUED] fluticasone (VERAMYST) 27.5 MCG/SPRAY nasal spray Place 2 sprays into the nose daily as needed for allergies.    [DISCONTINUED] lidocaine (LIDODERM) 5 % Place 1 patch onto the skin daily. Remove & Discard patch within 12 hours or as directed by MD   No facility-administered encounter medications on file as of 02/28/2023.    Allergies (verified) Penicillins, Sulfa antibiotics, and Azithromycin   History: Past Medical History:  Diagnosis Date   Actinic keratosis 06/25/2020   L pretibia distal - bx proven    Balance problems    BCC (basal cell carcinoma of skin) 12/17/2020   right temple, Moh's 01/19/2021   Brain tumor (HCC)    a. Left intraventricular tumor ->stable by 06/2017 MRI. Followed @ [facility].   Dysplastic nevus 06/25/2020   R flank - moderate   Gait disturbance    a. Unsteady on feet/balance difficulty.   Generalized osteoarthritis of multiple sites    GERD (gastroesophageal reflux disease)    History of DVT (deep vein thrombosis) 2016   estrogen and airflights. Rx with xarelto for 3 months   History of stress test    a. 11/2016 ETT: No ST/T changes.  Performed to assess PAC/PVC burden with activity ->No ectopy noted.   Hypertension    Hyperthyroidism  treated briefly in college   Insomnia    Obesity    PAF (paroxysmal atrial fibrillation) (HCC)    a. 12/2016 Event Monitor: brief run of PAF-->CHA2DS2VASc = 5--> Xarelto.   Plantar fasciitis    Retinal tear of left eye    Rosacea    SCC (squamous cell carcinoma) 08/12/2021   left chest treated with ED& C 04/06/22   Squamous cell carcinoma of skin 09/07/2017   R dorsum of hand    Type 2 diabetes, controlled, with peripheral neuropathy (HCC)    a. 11/2016 A1c 7.4.   Uterine fibroid    Past Surgical History:  Procedure Laterality Date   CARPAL TUNNEL RELEASE Right    Dr Mina Marble   CATARACT EXTRACTION      Gamma knife  07/2020   intraventricular mass---meningioma or choroid plexus papilloma   RETINAL TEAR REPAIR CRYOTHERAPY     10/09/13, then again 3/15   Family History  Problem Relation Age of Onset   Diabetes Father    Pancreatic cancer Father    Diabetes Other        grandmother   Social History   Socioeconomic History   Marital status: Married    Spouse name: Not on file   Number of children: 3   Years of education: Not on file   Highest education level: Not on file  Occupational History   Occupation: Non profit management    Comment: [employer] and others  Tobacco Use   Smoking status: Former    Types: Cigarettes    Quit date: 09/26/1993    Years since quitting: 29.4   Smokeless tobacco: Never   Tobacco comments:    quit in 1996  Vaping Use   Vaping Use: Never used  Substance and Sexual Activity   Alcohol use: Yes    Alcohol/week: 1.0 standard drink of alcohol    Types: 1 Glasses of wine per week   Drug use: No   Sexual activity: Not Currently    Partners: Male  Other Topics Concern   Not on file  Social History Narrative   Has living will   Husband is health care POA-- or daughter Harriett Sine   Would accept resuscitation attempts   Would not want tube feeds if cognitively unaware   Social Determinants of Health   Financial Resource Strain: Low Risk  (03/02/2021)   Overall Financial Resource Strain (CARDIA)    Difficulty of Paying Living Expenses: Not very hard  Food Insecurity: Not on file  Transportation Needs: Not on file  Physical Activity: Not on file  Stress: Not on file  Social Connections: Not on file    Tobacco Counseling Counseling given: Not Answered Tobacco comments: quit in 1996   Clinical Intake:  Pre-visit preparation completed: Yes  Pain : No/denies pain     BMI - recorded: 33 Nutritional Status: BMI > 30  Obese Diabetes: Yes  How often do you need to have someone help you when you read instructions, pamphlets, or other written materials  from your doctor or pharmacy?: 1 - Never  Diabetic?yes         Activities of Daily Living    02/28/2023   10:01 AM  In your present state of health, do you have any difficulty performing the following activities:  Hearing? 0  Vision? 0  Difficulty concentrating or making decisions? 1  Walking or climbing stairs? 0  Dressing or bathing? 0  Doing errands, shopping? 1  Comment does not drive  Preparing Food  and eating ? N  Comment husband prepares food  Using the Toilet? N  In the past six months, have you accidently leaked urine? N  Do you have problems with loss of bowel control? N  Managing your Medications? Y  Comment husband helps  Managing your Finances? Y  Comment husband helps  Housekeeping or managing your Housekeeping? Y  Comment has help    Patient Care Team: [name], MD as PCP - General (Family Medicine) [name] [name], MD as PCP - Cardiology (Cardiology) [name], [facility] as Pharmacist (Pharmacist)  Indicate any recent Medical Services you may have received from other than [facility] providers in the past year (date may be approximate).     Assessment:   This is a routine wellness examination for [place].  Hearing/Vision screen Vision Screening - Comments:: Daughter in law going to check on next appointment. (Dr. In [place])  Dietary issues and exercise activities discussed: Current Exercise Habits: The patient has a physically strenuous job, but has no regular exercise apart from work.   Goals Addressed             This Visit's Progress    Patient Stated       To maintain level of health       Depression Screen    02/28/2023    9:50 AM 08/03/2022    1:10 PM 10/29/2021    9:42 AM 10/14/2020   10:47 AM 10/09/2019    8:51 AM 10/09/2019    8:03 AM 10/03/2018    9:10 AM  PHQ 2/9 Scores  PHQ - 2 Score 0 0 0 0 0 0 0    Fall Risk    02/28/2023    9:50 AM 08/03/2022    1:10 PM 10/29/2021    9:41 AM 10/14/2020   10:47 AM 10/09/2019    8:51  AM  Fall Risk   Falls in the past year? 0 1 0 0 0  Number falls in past yr: 0 1     Injury with Fall? 0 0     Risk for fall due to :  History of fall(s)     Follow up  Falls evaluation completed       FALL RISK PREVENTION PERTAINING TO THE HOME:  Any stairs in or around the home? Yes  If so, are there any without handrails? No  Home free of loose throw rugs in walkways, pet beds, electrical cords, etc? Yes  Adequate lighting in your home to reduce risk of falls? Yes   ASSISTIVE DEVICES UTILIZED TO PREVENT FALLS:  Life alert? No  Use of a cane, walker or w/c? Yes  Grab bars in the bathroom? Yes  Shower chair or bench in shower? No  Elevated toilet seat or a handicapped toilet? No   TIMED UP AND GO:  Was the test performed? No .    Cognitive Function:        02/28/2023    9:48 AM  6CIT Screen  What Year? 4 points  What month? 0 points  What time? 0 points  Count back from 20 2 points  Months in reverse 4 points  Repeat phrase 10 points  Total Score 20 points    Immunizations Immunization History  Administered Date(s) Administered   Covid-19, Mrna,Vaccine(Spikevax)17yrs and older 01/12/2023   Influenza, High Dose Seasonal PF 07/09/2014, 07/23/2015, 06/02/2016, 07/02/2019, 07/13/2022   Influenza,inj,Quad PF,6+ Mos 06/23/2017, 07/17/2018   Influenza-Unspecified 07/23/2015, 06/22/2020, 07/08/2021   Moderna Covid-19 Vaccine Bivalent Booster  70yrs & up 02/22/2022   Moderna Sars-Covid-2 Vaccination 10/08/2019, 11/05/2019, 07/24/2020, 02/28/2021, 08/05/2022   Pfizer Covid-19 Vaccine Bivalent Booster 63yrs & up 06/17/2021   Pneumococcal Conjugate-13 01/30/2014   Pneumococcal Polysaccharide-23 09/13/2004, 11/03/2016   Tdap 10/23/2012, 02/08/2023   Zoster Recombinat (Shingrix) 11/19/2018, 04/11/2019   Zoster, Live 10/19/2006    TDAP status: Up to date  Flu Vaccine status: Up to date  Pneumococcal vaccine status: Up to date  Covid-19 vaccine status: Information  provided on how to obtain vaccines.   Qualifies for Shingles Vaccine? Yes   Zostavax completed Yes   Shingrix Completed?: Yes  Screening Tests Health Maintenance  Topic Date Due   OPHTHALMOLOGY EXAM  03/02/2023   COVID-19 Vaccine (9 - 2023-24 season) 03/09/2023   INFLUENZA VACCINE  04/27/2023   HEMOGLOBIN A1C  08/16/2023   FOOT EXAM  02/06/2024   Diabetic kidney evaluation - eGFR measurement  02/13/2024   Diabetic kidney evaluation - Urine ACR  02/13/2024   Medicare Annual Wellness (AWV)  02/28/2024   DTaP/Tdap/Td (3 - Td or Tdap) 02/07/2033   Pneumonia Vaccine 72+ Years old  Completed   DEXA SCAN  Completed   Zoster Vaccines- Shingrix  Completed   HPV VACCINES  Aged Out   MAMMOGRAM  Discontinued    Health Maintenance  There are no preventive care reminders to display for this patient.   Colorectal cancer screening: No longer required.   Mammogram status: No longer required due to age.  Declines bone density at this time.  Lung Cancer Screening: (Low Dose CT Chest recommended if Age 65-80 years, 30 pack-year currently smoking OR have quit w/in 15years.) does not qualify.   Lung Cancer Screening Referral: na  Additional Screening:  Hepatitis C Screening: does not qualify  Vision Screening: Recommended annual ophthalmology exams for early detection of glaucoma and other disorders of the eye. Is the patient up to date with their annual eye exam?  Yes  Who is the provider or what is the name of the office in which the patient attends annual eye exams? [facility] doctor If pt is not established with a provider, would they like to be referred to a provider to establish care? No .   Dental Screening: Recommended annual dental exams for proper oral hygiene  Community Resource Referral / Chronic Care Management: CRR required this visit?  No   CCM required this visit?  No      Plan:     I have personally reviewed and noted the following in the patient's chart:    Medical and social history Use of alcohol, tobacco or illicit drugs  Current medications and supplements including opioid prescriptions. Patient is not currently taking opioid prescriptions. Functional ability and status Nutritional status Physical activity Advanced directives List of other physicians Hospitalizations, surgeries, and ER visits in previous 12 months Vitals Screenings to include cognitive, depression, and falls Referrals and appointments  In addition, I have reviewed and discussed with patient certain preventive protocols, quality metrics, and best practice recommendations. A written personalized care plan for preventive services as well as general preventive health recommendations were provided to patient.     Sharon Seller, NP   02/28/2023   Place of service: [facility]

## 2023-03-03 LAB — HM DIABETES EYE EXAM

## 2023-03-07 DIAGNOSIS — H52203 Unspecified astigmatism, bilateral: Secondary | ICD-10-CM | POA: Diagnosis not present

## 2023-03-07 DIAGNOSIS — H43813 Vitreous degeneration, bilateral: Secondary | ICD-10-CM | POA: Diagnosis not present

## 2023-03-07 DIAGNOSIS — H353112 Nonexudative age-related macular degeneration, right eye, intermediate dry stage: Secondary | ICD-10-CM | POA: Diagnosis not present

## 2023-03-07 DIAGNOSIS — H18593 Other hereditary corneal dystrophies, bilateral: Secondary | ICD-10-CM | POA: Diagnosis not present

## 2023-03-07 DIAGNOSIS — H26492 Other secondary cataract, left eye: Secondary | ICD-10-CM | POA: Diagnosis not present

## 2023-03-07 DIAGNOSIS — H18513 Endothelial corneal dystrophy, bilateral: Secondary | ICD-10-CM | POA: Diagnosis not present

## 2023-03-07 DIAGNOSIS — H524 Presbyopia: Secondary | ICD-10-CM | POA: Diagnosis not present

## 2023-03-07 DIAGNOSIS — E119 Type 2 diabetes mellitus without complications: Secondary | ICD-10-CM | POA: Diagnosis not present

## 2023-03-07 LAB — HM DIABETES EYE EXAM

## 2023-03-08 ENCOUNTER — Telehealth: Payer: Self-pay | Admitting: *Deleted

## 2023-03-08 NOTE — Telephone Encounter (Signed)
-----   Message from [name], MD sent at 02/28/2023 10:42 AM EDT ----- Regarding: RE: when to follow up Previously discussed three month follow up can follow up September ----- Message ----- From: Sharon Seller, NP Sent: 02/28/2023  10:18 AM EDT To: Beckey Downing Armanii Pressnell, CMA; [name], MD Subject: when to follow up                              Daughter in law was in with pt today for her AWV and was wondering when you wanted to see her back- there was no follow up scheduled when she was here last.  Please call fred (or patient) to schedule follow up.

## 2023-03-08 NOTE — Telephone Encounter (Signed)
Appointment scheduled and confirmed for 06/16/2023 with Dr. Sydnee Cabal.

## 2023-03-13 ENCOUNTER — Other Ambulatory Visit: Payer: Self-pay | Admitting: Nurse Practitioner

## 2023-03-13 DIAGNOSIS — D329 Benign neoplasm of meninges, unspecified: Secondary | ICD-10-CM

## 2023-03-25 ENCOUNTER — Ambulatory Visit
Admission: RE | Admit: 2023-03-25 | Discharge: 2023-03-25 | Disposition: A | Discharge status: Home / Self Care | Payer: Medicare Other | Source: Ambulatory Visit | Attending: Nurse Practitioner | Admitting: Nurse Practitioner

## 2023-03-25 DIAGNOSIS — R9089 Other abnormal findings on diagnostic imaging of central nervous system: Secondary | ICD-10-CM | POA: Diagnosis not present

## 2023-03-25 DIAGNOSIS — D329 Benign neoplasm of meninges, unspecified: Secondary | ICD-10-CM | POA: Diagnosis not present

## 2023-03-25 DIAGNOSIS — G9389 Other specified disorders of brain: Secondary | ICD-10-CM | POA: Diagnosis not present

## 2023-03-25 DIAGNOSIS — I6782 Cerebral ischemia: Secondary | ICD-10-CM | POA: Diagnosis not present

## 2023-03-25 DIAGNOSIS — D320 Benign neoplasm of cerebral meninges: Secondary | ICD-10-CM | POA: Diagnosis not present

## 2023-03-25 MED ORDER — GADOBUTROL 1 MMOL/ML IV SOLN
9.0000 mL | Freq: Once | INTRAVENOUS | Status: AC | PRN
  Administered 2023-03-25: 9 mL via INTRAVENOUS

## 2023-03-28 ENCOUNTER — Telehealth: Payer: Self-pay | Admitting: *Deleted

## 2023-03-28 NOTE — Telephone Encounter (Signed)
Patient no showed for lab appointment on 03/27/2023.  Called and Left message to confirm appointment Reschedule for Labs for 04/03/2023 at 7:30am at [facility].   Elijah with [facility] stated that he has confirmed lab appointment with Husband.   Lab order date changed and placed in tray at the [facility] facility.

## 2023-04-04 DIAGNOSIS — R7989 Other specified abnormal findings of blood chemistry: Secondary | ICD-10-CM | POA: Diagnosis not present

## 2023-04-04 DIAGNOSIS — E039 Hypothyroidism, unspecified: Secondary | ICD-10-CM | POA: Diagnosis not present

## 2023-04-06 DIAGNOSIS — R413 Other amnesia: Secondary | ICD-10-CM | POA: Diagnosis not present

## 2023-04-06 DIAGNOSIS — D329 Benign neoplasm of meninges, unspecified: Secondary | ICD-10-CM | POA: Diagnosis not present

## 2023-04-06 LAB — T4 AND TSH
T4, Total: 8.5 ug/dL (ref 4.5–12.0)
TSH: 2.93 u[IU]/mL (ref 0.450–4.500)

## 2023-04-07 ENCOUNTER — Telehealth: Payer: BLUE CROSS/BLUE SHIELD | Admitting: Student

## 2023-04-07 NOTE — Telephone Encounter (Signed)
Called patient to relay most recent lab results. LVM. Most recent Thyroid labs were normal.

## 2023-04-11 NOTE — Telephone Encounter (Signed)
 LMOM to return call.

## 2023-04-11 NOTE — Telephone Encounter (Signed)
Patient notified and agreed.  

## 2023-05-10 ENCOUNTER — Ambulatory Visit: Payer: Medicare Other | Admitting: Dermatology

## 2023-06-16 ENCOUNTER — Encounter: Payer: Self-pay | Admitting: Student

## 2023-06-16 ENCOUNTER — Ambulatory Visit: Payer: Medicare Other | Admitting: Student

## 2023-06-16 VITALS — BP 138/78 | HR 80 | Temp 97.9°F | Ht 66.0 in | Wt 208.0 lb

## 2023-06-16 DIAGNOSIS — I480 Paroxysmal atrial fibrillation: Secondary | ICD-10-CM | POA: Diagnosis not present

## 2023-06-16 DIAGNOSIS — E1142 Type 2 diabetes mellitus with diabetic polyneuropathy: Secondary | ICD-10-CM | POA: Diagnosis not present

## 2023-06-16 DIAGNOSIS — D329 Benign neoplasm of meninges, unspecified: Secondary | ICD-10-CM | POA: Diagnosis not present

## 2023-06-16 DIAGNOSIS — I700 Atherosclerosis of aorta: Secondary | ICD-10-CM

## 2023-06-16 DIAGNOSIS — F03B0 Unspecified dementia, moderate, without behavioral disturbance, psychotic disturbance, mood disturbance, and anxiety: Secondary | ICD-10-CM | POA: Diagnosis not present

## 2023-06-16 NOTE — Patient Instructions (Signed)
Nice to see you today!   Please continue healthy diet and exercise.

## 2023-06-16 NOTE — Progress Notes (Unsigned)
Location:  [facility] clinic [facility].   Provider: Dr. [name]  Code Status: Full Code  Goals of Care:     06/16/2023    1:21 PM  Advanced Directives  Does Patient Have a Medical Advance Directive? Yes  Type of Advance Directive Living will  Does patient want to make changes to medical advance directive? No - Patient declined     Chief Complaint  Patient presents with   Medical Management of Chronic Issues    Medical Management of Chronic Issues. 3 Month Follow up   Quality Metric Gaps    To Discuss need for Flu and Covid.     HPI: Patient is a 82 y.o. female seen today for medical management of chronic diseases.  Her husband is present and aids with the history.   No recent falls. Doesn't need help with getting dressed, using the bathroom, showering.   They had to put their dog down a month ago.   The second round of gamma knife was the last round of neuroradiation she can have.   She has neuropsych evaluation in October 24.  Past Medical History:  Diagnosis Date   Actinic keratosis 06/25/2020   L pretibia distal - bx proven    Balance problems    BCC (basal cell carcinoma of skin) 12/17/2020   right temple, Moh's 01/19/2021   Brain tumor (HCC)    a. Left intraventricular tumor ->stable by 06/2017 MRI. Followed @ [facility].   Dysplastic nevus 06/25/2020   R flank - moderate   Gait disturbance    a. Unsteady on feet/balance difficulty.   Generalized osteoarthritis of multiple sites    GERD (gastroesophageal reflux disease)    History of DVT (deep vein thrombosis) 2016   estrogen and airflights. Rx with xarelto for 3 months   History of stress test    a. 11/2016 ETT: No ST/T changes.  Performed to assess PAC/PVC burden with activity ->No ectopy noted.   Hypertension    Hyperthyroidism    treated briefly in college   Insomnia    Obesity    PAF (paroxysmal atrial fibrillation) (HCC)    a. 12/2016 Event Monitor: brief run of PAF-->CHA2DS2VASc = 5--> Xarelto.    Plantar fasciitis    Retinal tear of left eye    Rosacea    SCC (squamous cell carcinoma) 08/12/2021   left chest treated with ED& C 04/06/22   Squamous cell carcinoma of skin 09/07/2017   R dorsum of hand    Type 2 diabetes, controlled, with peripheral neuropathy (HCC)    a. 11/2016 A1c 7.4.   Uterine fibroid     Past Surgical History:  Procedure Laterality Date   CARPAL TUNNEL RELEASE Right    Dr Mina Marble   CATARACT EXTRACTION     Gamma knife  07/2020   intraventricular mass---meningioma or choroid plexus papilloma   RETINAL TEAR REPAIR CRYOTHERAPY     10/09/13, then again 3/15    Allergies  Allergen Reactions   Penicillins     Has patient had a PCN reaction causing immediate rash, facial/tongue/throat swelling, SOB or lightheadedness with hypotension: Yes Has patient had a PCN reaction causing severe rash involving mucus membranes or skin necrosis: Yes Has patient had a PCN reaction that required hospitalization No Has patient had a PCN reaction occurring within the last 10 years: No If all of the above answers are "NO", then may proceed with Cephalosporin use.     Sulfa Antibiotics     Rash/itching  Azithromycin Palpitations    Outpatient Encounter Medications as of 06/16/2023  Medication Sig   glimepiride (AMARYL) 4 MG tablet Take 1 tablet (4 mg total) by mouth daily before breakfast.   losartan-hydrochlorothiazide (HYZAAR) 100-12.5 MG tablet Take 1 tablet by mouth daily.   metoprolol succinate (TOPROL-XL) 25 MG 24 hr tablet Take 1 tablet (25 mg total) by mouth daily.   Multiple Vitamin (MULTIVITAMIN) tablet Take 1 tablet by mouth daily.   pioglitazone (ACTOS) 45 MG tablet Take 1 tablet (45 mg total) by mouth daily.   rivaroxaban (XARELTO) 20 MG TABS tablet Take 1 tablet (20 mg total) by mouth daily with supper.   No facility-administered encounter medications on file as of 06/16/2023.    Review of Systems:  Review of Systems  Health Maintenance  Topic Date Due    INFLUENZA VACCINE  04/27/2023   COVID-19 Vaccine (9 - 2023-24 season) 05/28/2023   HEMOGLOBIN A1C  08/16/2023   FOOT EXAM  02/06/2024   Diabetic kidney evaluation - eGFR measurement  02/13/2024   Diabetic kidney evaluation - Urine ACR  02/13/2024   Medicare Annual Wellness (AWV)  02/28/2024   OPHTHALMOLOGY EXAM  03/02/2024   DTaP/Tdap/Td (3 - Td or Tdap) 02/07/2033   Pneumonia Vaccine 49+ Years old  Completed   DEXA SCAN  Completed   Zoster Vaccines- Shingrix  Completed   HPV VACCINES  Aged Out   MAMMOGRAM  Discontinued    Physical Exam: Vitals:   06/16/23 1318  BP: 138/78  Pulse: 80  Temp: 97.9 F (36.6 C)  SpO2: 94%  Weight: 208 lb (94.3 kg)  Height: 5\' 6"  (1.676 m)   Body mass index is 33.57 kg/m. Physical Exam Constitutional:      Appearance: Normal appearance.  Cardiovascular:     Rate and Rhythm: Normal rate and regular rhythm.  Pulmonary:     Effort: Pulmonary effort is normal.     Breath sounds: Normal breath sounds.  Abdominal:     General: Abdomen is flat.     Palpations: Abdomen is soft.  Neurological:     General: No focal deficit present.     Mental Status: She is alert. Mental status is at baseline.     Comments: Using a cane for ambulation, slow and dyskinetic     Labs reviewed: Basic Metabolic Panel: Recent Labs    02/13/23 0000 02/13/23 0730 04/04/23 0000  NA 139  --   --   K 4.1  --   --   CL 101  --   --   CO2 29  --   --   GLUCOSE 139*  --   --   BUN 13  --   --   CREATININE 0.84  --   --   CALCIUM 9.3  --   --   TSH  --  5.08* 2.930   Liver Function Tests: No results for input(s): "AST", "ALT", "ALKPHOS", "BILITOT", "PROT", "ALBUMIN" in the last 8760 hours. No results for input(s): "LIPASE", "AMYLASE" in the last 8760 hours. No results for input(s): "AMMONIA" in the last 8760 hours. CBC: Recent Labs    02/13/23 0000  WBC 7.6  NEUTROABS 4,894  HGB 11.9  HCT 36.0  MCV 88.0  PLT 229   Lipid Panel: Recent Labs     02/13/23 0000  CHOL 167  HDL 43*  LDLCALC 88  TRIG 956*  CHOLHDL 3.9   Lab Results  Component Value Date   HGBA1C 8.1 (H) 02/13/2023  Procedures since last visit: No results found.  Assessment/Plan Meningioma (HCC)  PAF (paroxysmal atrial fibrillation) (HCC)  Type 2 diabetes, controlled, with peripheral neuropathy (HCC)  Atherosclerosis of aorta (HCC)  Moderate dementia without behavioral disturbance, psychotic disturbance, mood disturbance, or anxiety, unspecified dementia type [facility]) Patient and spouse do not remember the last f/u with NSG, encouraged to call for more information as his note says f/u in 1 year. Patient with significant memory loss and lack of awareness with the change. Spouse with concern or redundency in every day life. Discussed possible definitive diagnostics with neurosych evaluation, plan to seek results from neurocognitive decline  whether this is due to underlying tumor or other etiology and potential need for therapies. Discussed ST and OT.  DM moderately controlled. Denies symptoms.Continue glimiperide 4 mg and actos.  BP well-controlled, continue hyzar. Labs at f/u appointment. Encourage diet and exercise. Rate controlled continue metoprolol and xarelto. No signs of bleeding.   Labs/tests ordered:  * No order type specified * Next appt:  Visit date not found I spent greater than 30 minutes for the care of this patient in face to face time, chart review, clinical documentation, patient education.    Coralyn Helling, MD, [facility] [phone]

## 2023-06-19 ENCOUNTER — Encounter: Payer: Self-pay | Admitting: Student

## 2023-06-23 DIAGNOSIS — Z23 Encounter for immunization: Secondary | ICD-10-CM | POA: Diagnosis not present

## 2023-07-20 DIAGNOSIS — F0390 Unspecified dementia without behavioral disturbance: Secondary | ICD-10-CM | POA: Diagnosis not present

## 2023-07-20 DIAGNOSIS — Z923 Personal history of irradiation: Secondary | ICD-10-CM | POA: Diagnosis not present

## 2023-07-20 DIAGNOSIS — Z86011 Personal history of benign neoplasm of the brain: Secondary | ICD-10-CM | POA: Diagnosis not present

## 2023-07-20 DIAGNOSIS — Z23 Encounter for immunization: Secondary | ICD-10-CM | POA: Diagnosis not present

## 2023-07-25 ENCOUNTER — Encounter: Payer: Self-pay | Admitting: Dermatology

## 2023-07-25 ENCOUNTER — Ambulatory Visit (INDEPENDENT_AMBULATORY_CARE_PROVIDER_SITE_OTHER): Payer: Medicare Other | Admitting: Dermatology

## 2023-07-25 DIAGNOSIS — L821 Other seborrheic keratosis: Secondary | ICD-10-CM

## 2023-07-25 NOTE — Progress Notes (Signed)
   Follow-Up Visit   Subjective  Angela Bentley is a 82 y.o. female who presents for the following: Spot  The patient has spots, moles and lesions to be evaluated, some may be new or changing and the patient may have concern these could be cancer.  Complains of scaly spot on left temple and chest. Husband says it is growing in size. Pt denies pain.    The following portions of the chart were reviewed this encounter and updated as appropriate: medications, allergies, medical history  Review of Systems:  No other skin or systemic complaints except as noted in HPI or Assessment and Plan.  Objective  Well appearing patient in no apparent distress; mood and affect are within normal limits.  A focused examination was performed of the following areas: Forehead  Relevant physical exam findings are noted in the Assessment and Plan.    Assessment & Plan   SEBORRHEIC KERATOSIS - Stuck-on, waxy, tan-brown papules and/or plaques on left temple and trunk - Benign-appearing - Discussed benign etiology and prognosis. - Observe - Call for any changes    No follow-ups on file.  I, Germaine Pomfret, CMA, am acting as scribe for Elie Goody, MD.   Documentation: I have reviewed the above documentation for accuracy and completeness, and I agree with the above.  Elie Goody, MD

## 2023-08-15 ENCOUNTER — Encounter: Payer: Self-pay | Admitting: Podiatry

## 2023-08-15 ENCOUNTER — Ambulatory Visit (INDEPENDENT_AMBULATORY_CARE_PROVIDER_SITE_OTHER): Payer: Medicare Other | Admitting: Podiatry

## 2023-08-15 DIAGNOSIS — L853 Xerosis cutis: Secondary | ICD-10-CM | POA: Diagnosis not present

## 2023-08-15 NOTE — Progress Notes (Signed)
Chief Complaint  Patient presents with   Diabetes    "I have a problem with dry skin." N - dry skin, swelling in ankles L - bilateral D - years O - gradually worse C - dry, Diabetic, swelling A - winter months T - put lotion on them sometime    HPI: 82 y.o. female presenting today as a new patient for evaluation of dry skin to the bilateral lower extremities.  She says she has no pain in her feet or legs.  She has not really done anything for treatment.  Past Medical History:  Diagnosis Date   Actinic keratosis 06/25/2020   L pretibia distal - bx proven    Balance problems    BCC (basal cell carcinoma of skin) 12/17/2020   right temple, Moh's 01/19/2021   Brain tumor (HCC)    a. Left intraventricular tumor ->stable by 06/2017 MRI. Followed @ [facility].   Dysplastic nevus 06/25/2020   R flank - moderate   Gait disturbance    a. Unsteady on feet/balance difficulty.   Generalized osteoarthritis of multiple sites    GERD (gastroesophageal reflux disease)    History of DVT (deep vein thrombosis) 2016   estrogen and airflights. Rx with xarelto for 3 months   History of stress test    a. 11/2016 ETT: No ST/T changes.  Performed to assess PAC/PVC burden with activity ->No ectopy noted.   Hypertension    Hyperthyroidism    treated briefly in college   Insomnia    Obesity    PAF (paroxysmal atrial fibrillation) (HCC)    a. 12/2016 Event Monitor: brief run of PAF-->CHA2DS2VASc = 5--> Xarelto.   Plantar fasciitis    Retinal tear of left eye    Rosacea    SCC (squamous cell carcinoma) 08/12/2021   left chest treated with ED& C 04/06/22   Squamous cell carcinoma of skin 09/07/2017   R dorsum of hand    Type 2 diabetes, controlled, with peripheral neuropathy (HCC)    a. 11/2016 A1c 7.4.   Uterine fibroid     Past Surgical History:  Procedure Laterality Date   CARPAL TUNNEL RELEASE Right    Dr Mina Marble   CATARACT EXTRACTION     Gamma knife  07/2020   intraventricular  mass---meningioma or choroid plexus papilloma   RETINAL TEAR REPAIR CRYOTHERAPY     10/09/13, then again 3/15    Allergies  Allergen Reactions   Penicillins     Has patient had a PCN reaction causing immediate rash, facial/tongue/throat swelling, SOB or lightheadedness with hypotension: Yes Has patient had a PCN reaction causing severe rash involving mucus membranes or skin necrosis: Yes Has patient had a PCN reaction that required hospitalization No Has patient had a PCN reaction occurring within the last 10 years: No If all of the above answers are "NO", then may proceed with Cephalosporin use.     Sulfa Antibiotics     Rash/itching   Azithromycin Palpitations     Physical Exam: General: The patient is alert and oriented x3 in no acute distress.  Dermatology: Skin is warm, dry and supple bilateral lower extremities.  Dry skin noted bilateral feet and legs with seborrheic keratosis  Vascular: Palpable pedal pulses bilaterally. Capillary refill within normal limits.  No appreciable edema.  No erythema.  Neurological: Grossly intact via light touch  Musculoskeletal Exam: No pedal deformities noted.  Patient ambulatory  Assessment/Plan of Care: 1.  Dry skin/seborrheic keratosis bilateral lower extremities 2.  Diabetes mellitus;  last A1c on 02/13/2023 was 8.1  -Patient evaluated -Patient admits to ambulating barefoot the majority of the day throughout the house.  Advised against this -Recommend good supportive shoes and sneakers with socks -Recommend lotion daily especially in the wintertime when there is dry -Return to clinic as needed       Felecia Shelling, DPM [facility]  Dr. Felecia Shelling, DPM    [address] [place], [place] [address]                Office [phone]  Fax [phone]

## 2023-09-06 ENCOUNTER — Encounter: Payer: Self-pay | Admitting: Student

## 2023-09-06 ENCOUNTER — Ambulatory Visit: Payer: Medicare Other | Admitting: Student

## 2023-09-06 VITALS — BP 154/86 | HR 81 | Temp 97.4°F | Ht 66.0 in | Wt 203.0 lb

## 2023-09-06 DIAGNOSIS — E1142 Type 2 diabetes mellitus with diabetic polyneuropathy: Secondary | ICD-10-CM | POA: Diagnosis not present

## 2023-09-06 DIAGNOSIS — F03B0 Unspecified dementia, moderate, without behavioral disturbance, psychotic disturbance, mood disturbance, and anxiety: Secondary | ICD-10-CM

## 2023-09-06 DIAGNOSIS — M1711 Unilateral primary osteoarthritis, right knee: Secondary | ICD-10-CM

## 2023-09-06 DIAGNOSIS — I480 Paroxysmal atrial fibrillation: Secondary | ICD-10-CM | POA: Diagnosis not present

## 2023-09-06 DIAGNOSIS — I10 Essential (primary) hypertension: Secondary | ICD-10-CM | POA: Diagnosis not present

## 2023-09-06 NOTE — Patient Instructions (Addendum)
Please Come to the clinic at 7:30AM 12/12 to have your labs collected.  Please check your blood pressure 1 hour after taking your medication each day for the next 2 weeks.   Apply Voltaren gel to the knee 3x daily as needed for knee pain.

## 2023-09-06 NOTE — Progress Notes (Signed)
Location:  [facility].   Provider: Dr. [name]  Code Status: Full Code Goals of Care:     09/06/2023    1:50 PM  Advanced Directives  Does Patient Have a Medical Advance Directive? Yes  Type of Advance Directive Living will  Does patient want to make changes to medical advance directive? No - Patient declined     Chief Complaint  Patient presents with   Medical Management of Chronic Issues    Medical Management of Chronic Issues. 3 Month Follow up   Quality Metric Gaps    To discuss need For Covid and Hemoglobin A1C    HPI: Patient is a 82 y.o. female seen today for medical management of chronic diseases. She states "everything is good." She ahd the memory evaluation at [facility]. Severe memory deficits MoCA    He has been doing the cooking. She is less social. Significant knee pain. She forgets the day, upcoming events, forgetting where the bathroom is. [place] have lived [place] campus [place] years [place].   Usinating and stooling independently. She is taking her medications most days, and they miss it sometimes. She continues to pick out her clothes and dressing herself. Today for the first time they had to check to see if she had her pants reversed. He helped with preparing for the rain today. She is in her pajamas often. She watches.    Past Medical History:  Diagnosis Date   Actinic keratosis 06/25/2020   L pretibia distal - bx proven    Balance problems    BCC (basal cell carcinoma of skin) 12/17/2020   right temple, Moh's 01/19/2021   Brain tumor (HCC)    a. Left intraventricular tumor ->stable by 06/2017 MRI. Followed @ [facility].   Dysplastic nevus 06/25/2020   R flank - moderate   Gait disturbance    a. Unsteady on feet/balance difficulty.   Generalized osteoarthritis of multiple sites    GERD (gastroesophageal reflux disease)    History of DVT (deep vein thrombosis) 2016   estrogen and airflights. Rx with xarelto for 3 months   History of stress  test    a. 11/2016 ETT: No ST/T changes.  Performed to assess PAC/PVC burden with activity ->No ectopy noted.   Hypertension    Hyperthyroidism    treated briefly in college   Insomnia    Obesity    PAF (paroxysmal atrial fibrillation) (HCC)    a. 12/2016 Event Monitor: brief run of PAF-->CHA2DS2VASc = 5--> Xarelto.   Plantar fasciitis    Retinal tear of left eye    Rosacea    SCC (squamous cell carcinoma) 08/12/2021   left chest treated with ED& C 04/06/22   Squamous cell carcinoma of skin 09/07/2017   R dorsum of hand    Type 2 diabetes, controlled, with peripheral neuropathy (HCC)    a. 11/2016 A1c 7.4.   Uterine fibroid     Past Surgical History:  Procedure Laterality Date   CARPAL TUNNEL RELEASE Right    Dr Mina Marble   CATARACT EXTRACTION     Gamma knife  07/2020   intraventricular mass---meningioma or choroid plexus papilloma   RETINAL TEAR REPAIR CRYOTHERAPY     10/09/13, then again 3/15    Allergies  Allergen Reactions   Penicillins     Has patient had a PCN reaction causing immediate rash, facial/tongue/throat swelling, SOB or lightheadedness with hypotension: Yes Has patient had a PCN reaction causing severe rash involving mucus membranes  or skin necrosis: Yes Has patient had a PCN reaction that required hospitalization No Has patient had a PCN reaction occurring within the last 10 years: No If all of the above answers are "NO", then may proceed with Cephalosporin use.     Sulfa Antibiotics     Rash/itching   Azithromycin Palpitations    Outpatient Encounter Medications as of 09/06/2023  Medication Sig   glimepiride (AMARYL) 4 MG tablet Take 1 tablet (4 mg total) by mouth daily before breakfast.   losartan-hydrochlorothiazide (HYZAAR) 100-12.5 MG tablet Take 1 tablet by mouth daily.   metoprolol succinate (TOPROL-XL) 25 MG 24 hr tablet Take 1 tablet (25 mg total) by mouth daily.   Multiple Vitamin (MULTIVITAMIN) tablet Take 1 tablet by mouth daily.    pioglitazone (ACTOS) 45 MG tablet Take 1 tablet (45 mg total) by mouth daily.   rivaroxaban (XARELTO) 20 MG TABS tablet Take 1 tablet (20 mg total) by mouth daily with supper.   No facility-administered encounter medications on file as of 09/06/2023.    Review of Systems:  Review of Systems  Health Maintenance  Topic Date Due   COVID-19 Vaccine (10 - 2023-24 season) 08/18/2023   HEMOGLOBIN A1C  08/16/2023   FOOT EXAM  02/06/2024   Diabetic kidney evaluation - eGFR measurement  02/13/2024   Diabetic kidney evaluation - Urine ACR  02/13/2024   Medicare Annual Wellness (AWV)  02/28/2024   OPHTHALMOLOGY EXAM  03/06/2024   DTaP/Tdap/Td (3 - Td or Tdap) 02/07/2033   Pneumonia Vaccine 34+ Years old  Completed   INFLUENZA VACCINE  Completed   DEXA SCAN  Completed   Zoster Vaccines- Shingrix  Completed   HPV VACCINES  Aged Out   MAMMOGRAM  Discontinued    Physical Exam: Vitals:   09/06/23 1347 09/06/23 1352  BP: (!) 162/92 (!) 154/86  Pulse: 81   Temp: (!) 97.4 F (36.3 C)   SpO2: 96%   Weight: 203 lb (92.1 kg)   Height: 5\' 6"  (1.676 m)    Body mass index is 32.77 kg/m. Physical Exam Constitutional:      Appearance: Normal appearance.  Cardiovascular:     Rate and Rhythm: Normal rate and regular rhythm.     Pulses: Normal pulses.     Heart sounds: Normal heart sounds.  Pulmonary:     Effort: Pulmonary effort is normal.  Abdominal:     General: Abdomen is flat. Bowel sounds are normal.     Palpations: Abdomen is soft.  Musculoskeletal:        General: No swelling or tenderness.  Skin:    General: Skin is warm and dry.     Capillary Refill: Capillary refill takes 2 to 3 seconds.  Neurological:     Mental Status: She is alert. Mental status is at baseline. She is disoriented.     Gait: Gait abnormal.     Comments: Using a cane for ambulation  Psychiatric:        Mood and Affect: Mood normal.     Labs reviewed: Basic Metabolic Panel: Recent Labs     02/13/23 0000 02/13/23 0730 04/04/23 0000  NA 139  --   --   K 4.1  --   --   CL 101  --   --   CO2 29  --   --   GLUCOSE 139*  --   --   BUN 13  --   --   CREATININE 0.84  --   --  CALCIUM 9.3  --   --   TSH  --  5.08* 2.930   Liver Function Tests: No results for input(s): "AST", "ALT", "ALKPHOS", "BILITOT", "PROT", "ALBUMIN" in the last 8760 hours. No results for input(s): "LIPASE", "AMYLASE" in the last 8760 hours. No results for input(s): "AMMONIA" in the last 8760 hours. CBC: Recent Labs    02/13/23 0000  WBC 7.6  NEUTROABS 4,894  HGB 11.9  HCT 36.0  MCV 88.0  PLT 229   Lipid Panel: Recent Labs    02/13/23 0000  CHOL 167  HDL 43*  LDLCALC 88  TRIG 130*  CHOLHDL 3.9   Lab Results  Component Value Date   HGBA1C 8.1 (H) 02/13/2023    Procedures since last visit: No results found.  Assessment/Plan Type 2 diabetes, controlled, with peripheral neuropathy (HCC) - Plan: Hemoglobin A1c  Moderate dementia without behavioral disturbance, psychotic disturbance, mood disturbance, or anxiety, unspecified dementia type (HCC)  Primary hypertension - Plan: Complete Metabolic Panel with eGFR, CBC With Differential/Platelet  Primary osteoarthritis of right knee  PAF (paroxysmal atrial fibrillation) (HCC) Patient had x-ray for routine visit.  Notable continued decline of cognition.  Patient is at a fast stage V as she is unable to adequately prepare for inclement weather without support.  Discussed memory care as well as adult day center as options given her continued decline.  Patient has started to withdraw from interactions with other people and stays in epidermis majority of the day better also signs of continued decline given previous active roles in the community.  Plan for labs as outlined above.  Requested family check blood pressure throughout the week at home to determine need for increasing medication as spouse states she has "whitecoat syndrome.  Encouraged  Tylenol for pain in the knee.  Family declined PT.  Continue Xarelto and metoprolol for A-fib no signs of bleeding at this time.  Will collect A1c to determine need for adjustments in diabetes management.  Contacted social work to aid in the transition to memory care and use of the day center.  Labs/tests ordered:  * No order type specified * Next appt:  Visit date not found

## 2023-09-07 DIAGNOSIS — E1142 Type 2 diabetes mellitus with diabetic polyneuropathy: Secondary | ICD-10-CM | POA: Diagnosis not present

## 2023-09-07 DIAGNOSIS — I10 Essential (primary) hypertension: Secondary | ICD-10-CM | POA: Diagnosis not present

## 2023-09-08 ENCOUNTER — Encounter: Payer: Self-pay | Admitting: Student

## 2023-09-08 LAB — COMPLETE METABOLIC PANEL WITH GFR
AG Ratio: 1.7 (calc) (ref 1.0–2.5)
ALT: 11 U/L (ref 6–29)
AST: 11 U/L (ref 10–35)
Albumin: 3.9 g/dL (ref 3.6–5.1)
Alkaline phosphatase (APISO): 51 U/L (ref 37–153)
BUN: 17 mg/dL (ref 7–25)
CO2: 30 mmol/L (ref 20–32)
Calcium: 9.1 mg/dL (ref 8.6–10.4)
Chloride: 104 mmol/L (ref 98–110)
Creat: 0.8 mg/dL (ref 0.60–0.95)
Globulin: 2.3 g/dL (ref 1.9–3.7)
Glucose, Bld: 159 mg/dL — ABNORMAL HIGH (ref 65–99)
Potassium: 3.9 mmol/L (ref 3.5–5.3)
Sodium: 142 mmol/L (ref 135–146)
Total Bilirubin: 0.5 mg/dL (ref 0.2–1.2)
Total Protein: 6.2 g/dL (ref 6.1–8.1)
eGFR: 73 mL/min/{1.73_m2} (ref >=60)

## 2023-09-08 LAB — CBC WITH DIFFERENTIAL/PLATELET
Absolute Lymphocytes: 1263 {cells}/uL (ref 850–3900)
Absolute Monocytes: 476 {cells}/uL (ref 200–950)
Basophils Absolute: 41 {cells}/uL (ref 0–200)
Basophils Relative: 0.6 %
Eosinophils Absolute: 138 {cells}/uL (ref 15–500)
Eosinophils Relative: 2 %
HCT: 35.2 % (ref 35.0–45.0)
Hemoglobin: 11.5 g/dL — ABNORMAL LOW (ref 11.7–15.5)
MCH: 29.1 pg (ref 27.0–33.0)
MCHC: 32.7 g/dL (ref 32.0–36.0)
MCV: 89.1 fL (ref 80.0–100.0)
MPV: 10.9 fL (ref 7.5–12.5)
Monocytes Relative: 6.9 %
Neutro Abs: 4982 {cells}/uL (ref 1500–7800)
Neutrophils Relative %: 72.2 %
Platelets: 203 10*3/uL (ref 140–400)
RBC: 3.95 10*6/uL (ref 3.80–5.10)
RDW: 13.2 % (ref 11.0–15.0)
Total Lymphocyte: 18.3 %
WBC: 6.9 10*3/uL (ref 3.8–10.8)

## 2023-09-08 LAB — HEMOGLOBIN A1C
Hgb A1c MFr Bld: 8.2 %{Hb} — ABNORMAL HIGH (ref <5.7)
Mean Plasma Glucose: 189 mg/dL
eAG (mmol/L): 10.4 mmol/L

## 2023-09-09 ENCOUNTER — Other Ambulatory Visit: Payer: Self-pay

## 2023-09-09 ENCOUNTER — Emergency Department
Admission: EM | Admit: 2023-09-09 | Discharge: 2023-09-09 | Disposition: A | Discharge status: Home / Self Care | Payer: Medicare Other | Attending: Emergency Medicine | Admitting: Emergency Medicine

## 2023-09-09 ENCOUNTER — Emergency Department: Payer: Medicare Other

## 2023-09-09 ENCOUNTER — Telehealth: Payer: Self-pay | Admitting: Student

## 2023-09-09 DIAGNOSIS — M25561 Pain in right knee: Secondary | ICD-10-CM | POA: Insufficient documentation

## 2023-09-09 DIAGNOSIS — W19XXXA Unspecified fall, initial encounter: Secondary | ICD-10-CM | POA: Diagnosis not present

## 2023-09-09 DIAGNOSIS — E119 Type 2 diabetes mellitus without complications: Secondary | ICD-10-CM | POA: Diagnosis not present

## 2023-09-09 DIAGNOSIS — S0990XA Unspecified injury of head, initial encounter: Secondary | ICD-10-CM | POA: Insufficient documentation

## 2023-09-09 DIAGNOSIS — S199XXA Unspecified injury of neck, initial encounter: Secondary | ICD-10-CM | POA: Diagnosis not present

## 2023-09-09 DIAGNOSIS — I10 Essential (primary) hypertension: Secondary | ICD-10-CM | POA: Insufficient documentation

## 2023-09-09 DIAGNOSIS — M549 Dorsalgia, unspecified: Secondary | ICD-10-CM | POA: Diagnosis not present

## 2023-09-09 DIAGNOSIS — F0390 Unspecified dementia without behavioral disturbance: Secondary | ICD-10-CM | POA: Diagnosis not present

## 2023-09-09 DIAGNOSIS — D496 Neoplasm of unspecified behavior of brain: Secondary | ICD-10-CM | POA: Diagnosis not present

## 2023-09-09 LAB — URINALYSIS, ROUTINE W REFLEX MICROSCOPIC
Bacteria, UA: NONE SEEN
Bilirubin Urine: NEGATIVE
Glucose, UA: >=500 mg/dL — AB
Hgb urine dipstick: NEGATIVE
Ketones, ur: NEGATIVE mg/dL
Nitrite: NEGATIVE
Protein, ur: NEGATIVE mg/dL
Specific Gravity, Urine: 1.023 (ref 1.005–1.030)
pH: 5 (ref 5.0–8.0)

## 2023-09-09 LAB — CBC WITH DIFFERENTIAL/PLATELET
Abs Immature Granulocytes: 0.03 10*3/uL (ref 0.00–0.07)
Basophils Absolute: 0 10*3/uL (ref 0.0–0.1)
Basophils Relative: 0 %
Eosinophils Absolute: 0.1 10*3/uL (ref 0.0–0.5)
Eosinophils Relative: 1 %
HCT: 36.6 % (ref 36.0–46.0)
Hemoglobin: 12.3 g/dL (ref 12.0–15.0)
Immature Granulocytes: 0 %
Lymphocytes Relative: 16 %
Lymphs Abs: 1.4 10*3/uL (ref 0.7–4.0)
MCH: 29.6 pg (ref 26.0–34.0)
MCHC: 33.6 g/dL (ref 30.0–36.0)
MCV: 88.2 fL (ref 80.0–100.0)
Monocytes Absolute: 0.6 10*3/uL (ref 0.1–1.0)
Monocytes Relative: 7 %
Neutro Abs: 7 10*3/uL (ref 1.7–7.7)
Neutrophils Relative %: 76 %
Platelets: 247 10*3/uL (ref 150–400)
RBC: 4.15 MIL/uL (ref 3.87–5.11)
RDW: 13.6 % (ref 11.5–15.5)
WBC: 9.1 10*3/uL (ref 4.0–10.5)
nRBC: 0 % (ref 0.0–0.2)

## 2023-09-09 LAB — COMPREHENSIVE METABOLIC PANEL
ALT: 24 U/L (ref 0–44)
AST: 41 U/L (ref 15–41)
Albumin: 4.2 g/dL (ref 3.5–5.0)
Alkaline Phosphatase: 63 U/L (ref 38–126)
Anion gap: 11 (ref 5–15)
BUN: 14 mg/dL (ref 8–23)
CO2: 27 mmol/L (ref 22–32)
Calcium: 9 mg/dL (ref 8.9–10.3)
Chloride: 97 mmol/L — ABNORMAL LOW (ref 98–111)
Creatinine, Ser: 0.91 mg/dL (ref 0.44–1.00)
GFR, Estimated: >60 mL/min (ref >60)
Glucose, Bld: 323 mg/dL — ABNORMAL HIGH (ref 70–99)
Potassium: 3.5 mmol/L (ref 3.5–5.1)
Sodium: 135 mmol/L (ref 135–145)
Total Bilirubin: 0.4 mg/dL (ref <1.2)
Total Protein: 7.4 g/dL (ref 6.5–8.1)

## 2023-09-09 LAB — TROPONIN I (HIGH SENSITIVITY): Troponin I (High Sensitivity): 10 ng/L (ref <18)

## 2023-09-09 NOTE — ED Provider Triage Note (Signed)
Emergency Medicine Provider Triage Evaluation Note  Angela Bentley , a 82 y.o. female  was evaluated in triage.  Pt complains of multiple falls in the last week.  Has been states she fell 4 times yesterday.  Lives at [facility].  States she is on Xarelto.  Denies chest pain/shortness of breath, however husband states she has very short-term memory loss.  Review of Systems  Positive:  Negative:   Physical Exam  BP 125/71 (BP Location: Right Arm)   Pulse 89   Temp 97.7 F (36.5 C) (Oral)   Resp 18   LMP 09/26/1997   SpO2 97%  Gen:   Awake, no distress   Resp:  Normal effort  MSK:   Moves extremities without difficulty  Other:    Medical Decision Making  Medically screening exam initiated at 3:22 PM.  Appropriate orders placed.  Angela Bentley was informed that the remainder of the evaluation will be completed by another provider, this initial triage assessment does not replace that evaluation, and the importance of remaining in the ED until their evaluation is complete.     Faythe Ghee, PA-C 09/09/23 1523

## 2023-09-09 NOTE — ED Triage Notes (Addendum)
First nurse note: Arrived by EMS from [facility] for falls. Has been at [facility] the past week for respite care. Reports 4 falls in last week. C/o right knee pain and back pain. History brain tumor and dementia.   Staff reported every time patient goes to stand her legs give out.    EMS vitals: 85HR 119/78b/p

## 2023-09-09 NOTE — Discharge Instructions (Signed)
Angela Bentley was seen in the ER today for evaluation after falls.  We fortunately did not find any serious injuries.  Please follow-up with your primary care doctor for further evaluation.  Please make sure she is using any assist devices as directed.  Return to the ER for any new or worsening symptoms.

## 2023-09-09 NOTE — Telephone Encounter (Signed)
Received call from short stay in assisted living memory care facility. Of note, patient was transferred there last evening due to 3 falls in the same day. Patient with significant memory decline has been increasingly disoriented. Urinary frequency during through the night and continued frequency today. There has likely been head trauma, but patient cannot recall the story for her falls. Nursing called family who has agreed to take her to the hospital at this time for further evaluation for falls and presumable UTI. Initially planned to order labs in the facility, however, given her significant decline in the last 48 hours, expeditious work up is warranted in the ED.

## 2023-09-09 NOTE — ED Notes (Addendum)
Assisted pt with bedpan at this time.

## 2023-09-09 NOTE — ED Triage Notes (Signed)
Pt to ed from home ([facility]) for multiple falls in the last week or so. Pt is caox4, and in no acute distress. See first nurse note for other.

## 2023-09-09 NOTE — ED Provider Notes (Signed)
[facility] Provider Note    Event Date/Time   First MD Initiated Contact with Patient 09/09/23 1749     (approximate)   History   Fall   HPI  Angela Bentley is a 82 year old female with history of T2DM, HTN, dementia, brain tumor presenting to the emerged department for evaluation after a fall.  Patient is in memory care at [facility].  Over the last week, she has had multiple falls.  Patient and husband at bedside do report that she has not been using her cane which she is instructed to use.  Unknown head strike.  On my evaluation, patient reports of left knee pain.  Documented with back pain in triage, but denies this on my evaluation.     Physical Exam   Triage Vital Signs: ED Triage Vitals  Encounter Vitals Group     BP 09/09/23 1515 125/71     Systolic BP Percentile --      Diastolic BP Percentile --      Pulse Rate 09/09/23 1515 89     Resp 09/09/23 1515 18     Temp 09/09/23 1515 97.7 F (36.5 C)     Temp Source 09/09/23 1515 Oral     SpO2 09/09/23 1515 97 %     Weight --      Height --      Head Circumference --      Peak Flow --      Pain Score 09/09/23 1535 4     Pain Loc --      Pain Education --      Exclude from Growth Chart --     Most recent vital signs: Vitals:   09/09/23 1800 09/09/23 1900  BP: (!) 174/88 133/74  Pulse: 92 83  Resp:  18  Temp:    SpO2: 98% 99%    Nursing notes and vital signs reviewed.  General: Adult female, laying in bed, awake, no active Head: Atraumatic Back: No midline tenderness or overlying skin changes Chest: Symmetric chest rise, no tenderness to palpation.  Cardiac: Regular rhythm and rate.  Respiratory: Lungs clear to auscultation Abdomen: Soft, nondistended. No tenderness to palpation.  Pelvis: Stable in AP and lateral compression. No tenderness to palpation. MSK: No deformity to bilateral upper and lower extremity. Full range of motion to bilateral upper lower extremity.  Pain  reported with range of motion of right knee, but husband reports that this is not new for the patient. Neuro: Alert, somewhat confused, but some orientation to situation.  Normal sensation to light touch in bilateral upper and lower extremity. Skin: No evidence of burns or lacerations.   ED Results / Procedures / Treatments   Labs (all labs ordered are listed, but only abnormal results are displayed) Labs Reviewed  COMPREHENSIVE METABOLIC PANEL - Abnormal; Notable for the following components:      Result Value   Chloride 97 (*)    Glucose, Bld 323 (*)    All other components within normal limits  URINALYSIS, ROUTINE W REFLEX MICROSCOPIC - Abnormal; Notable for the following components:   Color, Urine YELLOW (*)    APPearance HAZY (*)    Glucose, UA >=500 (*)    Leukocytes,Ua TRACE (*)    All other components within normal limits  URINE CULTURE  CBC WITH DIFFERENTIAL/PLATELET  TROPONIN I (HIGH SENSITIVITY)     EKG EKG independently reviewed interpreted by myself (ER attending) demonstrates:  EKG demonstrates normal sinus rhythm at a rate  of 94, PR 154, QRS 80, QTc 480, ST changes, no STEMI  RADIOLOGY Imaging independently reviewed and interpreted by myself demonstrates:  CT head without acute bleed CT C-spine without acute fracture Knee x-Roxi Hlavaty without acute fracture  PROCEDURES:  Critical Care performed: No  Procedures   MEDICATIONS ORDERED IN ED: Medications - No data to display   IMPRESSION / MDM / ASSESSMENT AND PLAN / ED COURSE  I reviewed the triage vital signs and the nursing notes.  Differential diagnosis includes, but is not limited to, fall secondary to imbalance when not using assist devices, intracranial bleed, neck injury, no evidence of thoracoabdominal trauma, extremity fracture, dislocation, soft tissue injury  Patient's presentation is most consistent with acute presentation with potential threat to life or bodily function.  82 year old female  presenting to the emergency department after multiple falls.  Overall well-appearing on exam here.  Imaging fortunately without significant injury.  Labs sent from triage without critical derangements.  Urine overall not suggestive of infection, culture sent.  Patient without complaints currently.  Both patient and husband are comfortable with her being discharged back to [facility].  Do think this is reasonable.  Strict return precautions provided.  Patient discharged stable condition.      FINAL CLINICAL IMPRESSION(S) / ED DIAGNOSES   Final diagnoses:  Injury of head, initial encounter  Fall, initial encounter  Right knee pain, unspecified chronicity     Rx / DC Orders   ED Discharge Orders     None        Note:  This document was prepared using Dragon voice recognition software and may include unintentional dictation errors.   Trinna Post, MD 09/09/23 [phone]

## 2023-09-11 ENCOUNTER — Non-Acute Institutional Stay (SKILLED_NURSING_FACILITY): Payer: Self-pay | Admitting: Student

## 2023-09-11 DIAGNOSIS — I10 Essential (primary) hypertension: Secondary | ICD-10-CM

## 2023-09-11 DIAGNOSIS — F03B18 Unspecified dementia, moderate, with other behavioral disturbance: Secondary | ICD-10-CM | POA: Diagnosis not present

## 2023-09-11 DIAGNOSIS — M2041 Other hammer toe(s) (acquired), right foot: Secondary | ICD-10-CM | POA: Diagnosis not present

## 2023-09-11 DIAGNOSIS — I480 Paroxysmal atrial fibrillation: Secondary | ICD-10-CM

## 2023-09-11 DIAGNOSIS — R634 Abnormal weight loss: Secondary | ICD-10-CM | POA: Diagnosis not present

## 2023-09-11 DIAGNOSIS — Z9181 History of falling: Secondary | ICD-10-CM | POA: Diagnosis not present

## 2023-09-11 DIAGNOSIS — D329 Benign neoplasm of meninges, unspecified: Secondary | ICD-10-CM | POA: Diagnosis not present

## 2023-09-11 DIAGNOSIS — F5105 Insomnia due to other mental disorder: Secondary | ICD-10-CM | POA: Diagnosis not present

## 2023-09-11 DIAGNOSIS — R41841 Cognitive communication deficit: Secondary | ICD-10-CM | POA: Diagnosis not present

## 2023-09-11 DIAGNOSIS — Z7901 Long term (current) use of anticoagulants: Secondary | ICD-10-CM | POA: Diagnosis not present

## 2023-09-11 DIAGNOSIS — M6281 Muscle weakness (generalized): Secondary | ICD-10-CM | POA: Diagnosis not present

## 2023-09-11 DIAGNOSIS — F03B0 Unspecified dementia, moderate, without behavioral disturbance, psychotic disturbance, mood disturbance, and anxiety: Secondary | ICD-10-CM

## 2023-09-11 DIAGNOSIS — E1122 Type 2 diabetes mellitus with diabetic chronic kidney disease: Secondary | ICD-10-CM | POA: Diagnosis not present

## 2023-09-11 DIAGNOSIS — J069 Acute upper respiratory infection, unspecified: Secondary | ICD-10-CM | POA: Diagnosis not present

## 2023-09-11 DIAGNOSIS — E1142 Type 2 diabetes mellitus with diabetic polyneuropathy: Secondary | ICD-10-CM

## 2023-09-11 DIAGNOSIS — Z09 Encounter for follow-up examination after completed treatment for conditions other than malignant neoplasm: Secondary | ICD-10-CM | POA: Diagnosis not present

## 2023-09-11 DIAGNOSIS — R270 Ataxia, unspecified: Secondary | ICD-10-CM | POA: Diagnosis not present

## 2023-09-11 DIAGNOSIS — M1711 Unilateral primary osteoarthritis, right knee: Secondary | ICD-10-CM

## 2023-09-11 DIAGNOSIS — B351 Tinea unguium: Secondary | ICD-10-CM | POA: Diagnosis not present

## 2023-09-11 DIAGNOSIS — F03B11 Unspecified dementia, moderate, with agitation: Secondary | ICD-10-CM | POA: Diagnosis not present

## 2023-09-11 DIAGNOSIS — E1151 Type 2 diabetes mellitus with diabetic peripheral angiopathy without gangrene: Secondary | ICD-10-CM | POA: Diagnosis not present

## 2023-09-11 DIAGNOSIS — F03B3 Unspecified dementia, moderate, with mood disturbance: Secondary | ICD-10-CM | POA: Diagnosis not present

## 2023-09-11 DIAGNOSIS — F03B4 Unspecified dementia, moderate, with anxiety: Secondary | ICD-10-CM | POA: Diagnosis not present

## 2023-09-11 DIAGNOSIS — E0590 Thyrotoxicosis, unspecified without thyrotoxic crisis or storm: Secondary | ICD-10-CM | POA: Diagnosis not present

## 2023-09-11 DIAGNOSIS — M2042 Other hammer toe(s) (acquired), left foot: Secondary | ICD-10-CM | POA: Diagnosis not present

## 2023-09-11 LAB — BASIC METABOLIC PANEL
BUN: 19 (ref 4–21)
CO2: 30 — AB (ref 13–22)
Chloride: 98 — AB (ref 99–108)
Creatinine: 0.9 (ref 0.5–1.1)
Glucose: 209
Potassium: 3.8 meq/L (ref 3.5–5.1)
Sodium: 135 — AB (ref 137–147)

## 2023-09-11 LAB — URINE CULTURE

## 2023-09-11 LAB — COMPREHENSIVE METABOLIC PANEL
Calcium: 9.1 (ref 8.7–10.7)
eGFR: 61

## 2023-09-11 LAB — CBC AND DIFFERENTIAL
HCT: 36 (ref 36–46)
Hemoglobin: 11.8 — AB (ref 12.0–16.0)
Neutrophils Absolute: 8299
Platelets: 211 10*3/uL (ref 150–400)
WBC: 10.4

## 2023-09-11 LAB — CBC: RBC: 4.01 (ref 3.87–5.11)

## 2023-09-11 NOTE — Progress Notes (Signed)
Location:  Other Nursing Home Room Number: [place] [address] A Place of Service:  SNF (31) Provider:  Ander Gaster, Benetta Spar, MD  Patient Care Team: [name], MD as PCP - General (Family Medicine) [name] [name], MD as PCP - Cardiology (Cardiology) [name], [facility] (Inactive) as Pharmacist (Pharmacist) [name], MD as Consulting Physician (Ophthalmology)  Extended Emergency Contact Information Primary Emergency Contact: Adorno,Fred Address: [phone]          [place] [address] [place] Home Phone: [phone] Relation: Spouse Secondary Emergency Contact: Jones,Nancy Mobile Phone: [phone] Relation: Daughter Preferred language: English  Code Status:  Full Code Goals of care: Advanced Directive information    09/06/2023    1:50 PM  Advanced Directives  Does Patient Have a Medical Advance Directive? Yes  Type of Advance Directive Living will  Does patient want to make changes to medical advance directive? No - Patient declined     Chief Complaint  Patient presents with   Admission for LTC to Nursing Home    HPI:  Pt is a 82 y.o. female seen today for an acute visit for follow-up after ED visit as well as admission to [facility] for long-term care.  This weekend patient was admitted to [facility] for short stay after experiencing 4 falls at home.  Initially on Friday patient declined ED evaluation.  Patient fell 2 times in [facility] and was sent to the emergency department for evaluation of head trauma as she was found on her back.  Patient unable to give history of fall due to dementia.  CT head and neck unremarkable.  Labs unremarkable.  Urinalysis concerning for potential infection however multi species present and recommend recollection.  Today patient is fine without pain and in good spirits.  Since spent 20 minutes discussing goals of care for patient regarding adequate and appropriate level of care with  her spouse.  In the last 2 years patient has had significant decline of memory.  She has had 40 pound weight which likely contributes to poor diabetes management.  She lacks safety awareness.  She no longer cooks or cleans in the home.  She cannot use a microwave.  She is supposed to have knee surgery 2 years ago however due to poorly controlled diabetes was not a candidate at that time.  Discussed with patient's spouse 7 stages of dementia function and concerned that she is not a good fit for memory care at this time given lack of ambulation as it memory care is for patient's who are assisted living level of care.  At home he has not required significant support with cleaning services 2 times a month this is only outside resource.  There were discussions about her participating in the adult day center, however given transition to nursing facility unlikely at this time.  Discussed concern that she is progressed beyond memory care and spouse endorsed understanding   Past Medical History:  Diagnosis Date   Actinic keratosis 06/25/2020   L pretibia distal - bx proven    Balance problems    BCC (basal cell carcinoma of skin) 12/17/2020   right temple, Moh's 01/19/2021   Brain tumor (HCC)    a. Left intraventricular tumor ->stable by 06/2017 MRI. Followed @ [facility].   Dysplastic nevus 06/25/2020   R flank - moderate   Gait disturbance    a. Unsteady on feet/balance difficulty.   Generalized osteoarthritis of multiple sites    GERD (gastroesophageal reflux disease)  History of DVT (deep vein thrombosis) 2016   estrogen and airflights. Rx with xarelto for 3 months   History of stress test    a. 11/2016 ETT: No ST/T changes.  Performed to assess PAC/PVC burden with activity ->No ectopy noted.   Hypertension    Hyperthyroidism    treated briefly in college   Insomnia    Obesity    PAF (paroxysmal atrial fibrillation) (HCC)    a. 12/2016 Event Monitor: brief run of PAF-->CHA2DS2VASc = 5--> Xarelto.    Plantar fasciitis    Retinal tear of left eye    Rosacea    SCC (squamous cell carcinoma) 08/12/2021   left chest treated with ED& C 04/06/22   Squamous cell carcinoma of skin 09/07/2017   R dorsum of hand    Type 2 diabetes, controlled, with peripheral neuropathy (HCC)    a. 11/2016 A1c 7.4.   Uterine fibroid    Past Surgical History:  Procedure Laterality Date   CARPAL TUNNEL RELEASE Right    Dr Mina Marble   CATARACT EXTRACTION     Gamma knife  07/2020   intraventricular mass---meningioma or choroid plexus papilloma   RETINAL TEAR REPAIR CRYOTHERAPY     10/09/13, then again 3/15    Allergies  Allergen Reactions   Penicillins     Has patient had a PCN reaction causing immediate rash, facial/tongue/throat swelling, SOB or lightheadedness with hypotension: Yes Has patient had a PCN reaction causing severe rash involving mucus membranes or skin necrosis: Yes Has patient had a PCN reaction that required hospitalization No Has patient had a PCN reaction occurring within the last 10 years: No If all of the above answers are "NO", then may proceed with Cephalosporin use.     Sulfa Antibiotics     Rash/itching   Azithromycin Palpitations    Outpatient Encounter Medications as of 09/11/2023  Medication Sig   empagliflozin (JARDIANCE) 25 MG TABS tablet Take 1 tablet (25 mg total) by mouth daily before breakfast.   traZODone (DESYREL) 50 MG tablet Take 25 mg by mouth 3 (three) times daily as needed.   glimepiride (AMARYL) 4 MG tablet Take 1 tablet (4 mg total) by mouth daily before breakfast.   losartan-hydrochlorothiazide (HYZAAR) 100-12.5 MG tablet Take 1 tablet by mouth daily.   metoprolol succinate (TOPROL-XL) 25 MG 24 hr tablet Take 1 tablet (25 mg total) by mouth daily.   Multiple Vitamin (MULTIVITAMIN) tablet Take 1 tablet by mouth daily.   rivaroxaban (XARELTO) 20 MG TABS tablet Take 1 tablet (20 mg total) by mouth daily with supper.   [DISCONTINUED] pioglitazone (ACTOS) 45 MG  tablet Take 1 tablet (45 mg total) by mouth daily.   No facility-administered encounter medications on file as of 09/11/2023.    Review of Systems  Immunization History  Administered Date(s) Administered   Influenza, High Dose Seasonal PF 07/09/2014, 07/23/2015, 06/02/2016, 07/02/2019, 07/13/2022   Influenza,inj,Quad PF,6+ Mos 06/23/2017, 07/17/2018   Influenza-Unspecified 07/23/2015, 06/22/2020, 07/08/2021, 07/20/2023   Moderna Covid-19 Fall Seasonal Vaccine 68yrs & older 01/12/2023   Moderna Covid-19 Vaccine Bivalent Booster 8yrs & up 02/22/2022   Moderna Sars-Covid-2 Vaccination 10/08/2019, 11/05/2019, 07/24/2020, 02/28/2021, 08/05/2022   Pfizer Covid-19 Vaccine Bivalent Booster 66yrs & up 06/17/2021   Pneumococcal Conjugate-13 01/30/2014   Pneumococcal Polysaccharide-23 09/13/2004, 11/03/2016   Tdap 10/23/2012, 02/08/2023   Unspecified SARS-COV-2 Vaccination 06/23/2023   Zoster Recombinant(Shingrix) 11/19/2018, 04/11/2019   Zoster, Live 10/19/2006   Pertinent  Health Maintenance Due  Topic Date Due   FOOT EXAM  02/06/2024   OPHTHALMOLOGY EXAM  03/06/2024   HEMOGLOBIN A1C  03/07/2024   INFLUENZA VACCINE  Completed   DEXA SCAN  Completed   MAMMOGRAM  Discontinued      10/09/2019    8:51 AM 10/14/2020   10:47 AM 10/29/2021    9:41 AM 08/03/2022    1:10 PM 02/28/2023    9:50 AM  Fall Risk  Falls in the past year? 0 0 0 1 0  Was there an injury with Fall?    0 0  Fall Risk Category Calculator    2 0  Fall Risk Category (Retired)    Moderate   (RETIRED) Patient Fall Risk Level    Moderate fall risk   Patient at Risk for Falls Due to    History of fall(s)   Fall risk Follow up    Falls evaluation completed    Functional Status Survey:    Vitals:   09/12/23 0707  BP: 126/70  Pulse: (!) 102  Resp: 18  Temp: 97.7 F (36.5 C)  SpO2: 97%  Weight: 209 lb 6.4 oz (95 kg)   Body mass index is 33.8 kg/m. Physical Exam Constitutional:      Appearance: Normal appearance.   Cardiovascular:     Rate and Rhythm: Normal rate.     Pulses: Normal pulses.  Pulmonary:     Effort: Pulmonary effort is normal.  Abdominal:     General: Abdomen is flat.     Palpations: Abdomen is soft.  Skin:    General: Skin is warm and dry.  Neurological:     Mental Status: She is alert. Mental status is at baseline. She is disoriented.     Labs reviewed: Recent Labs    02/13/23 0000 09/07/23 0800 09/09/23 1522  NA 139 142 135  K 4.1 3.9 3.5  CL 101 104 97*  CO2 29 30 27   GLUCOSE 139* 159* 323*  BUN 13 17 14   CREATININE 0.84 0.80 0.91  CALCIUM 9.3 9.1 9.0   Recent Labs    09/07/23 0800 09/09/23 1522  AST 11 41  ALT 11 24  ALKPHOS  --  63  BILITOT 0.5 0.4  PROT 6.2 7.4  ALBUMIN  --  4.2   Recent Labs    02/13/23 0000 09/07/23 0800 09/09/23 1522  WBC 7.6 6.9 9.1  NEUTROABS 4,894 4,982 7.0  HGB 11.9 11.5* 12.3  HCT 36.0 35.2 36.6  MCV 88.0 89.1 88.2  PLT 229 203 247   Lab Results  Component Value Date   TSH 2.930 04/04/2023   Lab Results  Component Value Date   HGBA1C 8.2 (H) 09/07/2023   Lab Results  Component Value Date   CHOL 167 02/13/2023   HDL 43 (L) 02/13/2023   LDLCALC 88 02/13/2023   LDLDIRECT 104.0 10/29/2021   TRIG 302 (H) 02/13/2023   CHOLHDL 3.9 02/13/2023    Significant Diagnostic Results in last 30 days:  DG Knee Complete 4 Views Right Result Date: 09/09/2023 CLINICAL DATA:  Fall, right knee pain EXAM: RIGHT KNEE - COMPLETE 4+ VIEW COMPARISON:  10/07/2020 FINDINGS: No evidence of fracture, dislocation, or joint effusion. Mild tricompartmental osteoarthritis. Soft tissues are unremarkable. IMPRESSION: Negative. Electronically Signed   By: Duanne Guess D.O.   On: 09/09/2023 17:28   CT HEAD WO CONTRAST ( ) Result Date: 09/09/2023 CLINICAL DATA:  Head trauma, minor (Age >= 65y); Neck trauma (Age >= 65y). Recurrent falls. History of brain tumor. EXAM: CT HEAD WITHOUT CONTRAST CT CERVICAL SPINE  WITHOUT CONTRAST TECHNIQUE:  Multidetector CT imaging of the head and cervical spine was performed following the standard protocol without intravenous contrast. Multiplanar CT image reconstructions of the cervical spine were also generated. RADIATION DOSE REDUCTION: This exam was performed according to the departmental dose-optimization program which includes automated exposure control, adjustment of the mA and/or kV according to patient size and/or use of iterative reconstruction technique. COMPARISON:  MRI brain 03/25/2023. FINDINGS: CT HEAD FINDINGS Brain: No acute hemorrhage. Unchanged mixed density mass centered within the trigone of the left lateral ventricle with hyperdense component measuring up to 3.0 cm (axial image 17 series 2), consistent with reported diagnosis of meningioma. Unchanged moderate chronic small-vessel disease. No new loss of gray-white differentiation. Stable size and configuration of the ventricles. No extra-axial collection or midline shift. Vascular: No hyperdense vessel or unexpected calcification. Skull: No calvarial fracture or suspicious bone lesion. Skull base is unremarkable. Sinuses/Orbits: No acute finding. Other: None. CT CERVICAL SPINE FINDINGS Alignment: Normal. Skull base and vertebrae: No acute fracture. Normal craniocervical junction. No suspicious bone lesions. Soft tissues and spinal canal: No prevertebral fluid or swelling. No visible canal hematoma. Disc levels: Multilevel cervical spondylosis, worst at C5-6, where there is at least mild spinal canal stenosis. Upper chest: No acute findings. Other: None. IMPRESSION: 1. No acute intracranial abnormality. 2. No acute cervical spine fracture or traumatic listhesis. 3. Unchanged mixed density mass centered within the trigone of the left lateral ventricle, consistent with reported diagnosis of meningioma. Electronically Signed   By: Orvan Falconer M.D.   On: 09/09/2023 16:14   CT Cervical Spine Wo Contrast Result Date: 09/09/2023 CLINICAL DATA:   Head trauma, minor (Age >= 65y); Neck trauma (Age >= 65y). Recurrent falls. History of brain tumor. EXAM: CT HEAD WITHOUT CONTRAST CT CERVICAL SPINE WITHOUT CONTRAST TECHNIQUE: Multidetector CT imaging of the head and cervical spine was performed following the standard protocol without intravenous contrast. Multiplanar CT image reconstructions of the cervical spine were also generated. RADIATION DOSE REDUCTION: This exam was performed according to the departmental dose-optimization program which includes automated exposure control, adjustment of the mA and/or kV according to patient size and/or use of iterative reconstruction technique. COMPARISON:  MRI brain 03/25/2023. FINDINGS: CT HEAD FINDINGS Brain: No acute hemorrhage. Unchanged mixed density mass centered within the trigone of the left lateral ventricle with hyperdense component measuring up to 3.0 cm (axial image 17 series 2), consistent with reported diagnosis of meningioma. Unchanged moderate chronic small-vessel disease. No new loss of gray-white differentiation. Stable size and configuration of the ventricles. No extra-axial collection or midline shift. Vascular: No hyperdense vessel or unexpected calcification. Skull: No calvarial fracture or suspicious bone lesion. Skull base is unremarkable. Sinuses/Orbits: No acute finding. Other: None. CT CERVICAL SPINE FINDINGS Alignment: Normal. Skull base and vertebrae: No acute fracture. Normal craniocervical junction. No suspicious bone lesions. Soft tissues and spinal canal: No prevertebral fluid or swelling. No visible canal hematoma. Disc levels: Multilevel cervical spondylosis, worst at C5-6, where there is at least mild spinal canal stenosis. Upper chest: No acute findings. Other: None. IMPRESSION: 1. No acute intracranial abnormality. 2. No acute cervical spine fracture or traumatic listhesis. 3. Unchanged mixed density mass centered within the trigone of the left lateral ventricle, consistent with  reported diagnosis of meningioma. Electronically Signed   By: Orvan Falconer M.D.   On: 09/09/2023 16:14    Assessment/Plan Moderate dementia without behavioral disturbance, psychotic disturbance, mood disturbance, or anxiety, unspecified dementia type (HCC)  Type 2 diabetes, controlled, with  peripheral neuropathy (HCC) - Plan: empagliflozin (JARDIANCE) 25 MG TABS tablet  Meningioma (HCC)  PAF (paroxysmal atrial fibrillation) (HCC)  Primary osteoarthritis of right knee  Primary hypertension  Hyperthyroidism Patient was recently at a FAST stage V, however, she has had recent significant decline in the last 72 hours. She has been lift dependent for transfers and using a wheelchair. At this time requiring higher level of care for additional therapy and 24-hour surveillance.  Patient lacks safety awareness. She requires support for meals, medications, and daily reminders for self care (she wears her pajamas most of the time). History of meningioma s/p gamma knife x2. Unclear if this progressed memory decline expeditiously, however, NSG will not offer further attempts at shrinkage. Hx of DM, poorly controlled. Unclear of missed dosing at home. Will change to jardiance from pioglitazone. Daily glucose checks to determine need for additional medication vs initiation of insulin. Recollect Urinalysis/culture. Rate controled, no signes of bleeding continue xarelto. BP well-controlled at this time. Thyroid within goal range.   Family/ staff Communication: Spouse, nursing  Labs/tests ordered:  Labs pending  I spent greater than 45  minutes for the care of this patient in face to face time, chart review, clinical documentation, patient education. I spent an additional 16 minutes discussing goals of care and advanced care planning.

## 2023-09-12 ENCOUNTER — Encounter: Payer: Self-pay | Admitting: Student

## 2023-09-12 MED ORDER — EMPAGLIFLOZIN 25 MG PO TABS
25.0000 mg | ORAL_TABLET | Freq: Every day | ORAL | Status: AC
Start: 2023-09-12 — End: ?

## 2023-09-25 ENCOUNTER — Other Ambulatory Visit: Payer: Self-pay | Admitting: *Deleted

## 2023-09-25 NOTE — Patient Outreach (Signed)
Angela Bentley resides in [facility]. Angela Bentley utilized [facility] SNF waiver for admission to [facility].   Update from [facility], Nurse, adult. Angela Bentley will transition to LTC soon.  No identifiable care management needs.   [name], MSN, RN, BSN [place]  [facility], [facility] RN Post- Acute Care Manager Direct Dial: [phone]

## 2023-09-29 ENCOUNTER — Non-Acute Institutional Stay (SKILLED_NURSING_FACILITY): Payer: Self-pay | Admitting: Student

## 2023-09-29 ENCOUNTER — Encounter: Payer: Self-pay | Admitting: Student

## 2023-09-29 DIAGNOSIS — F03B4 Unspecified dementia, moderate, with anxiety: Secondary | ICD-10-CM

## 2023-09-29 NOTE — Progress Notes (Signed)
 Location:  [place].  Nursing Home Room Number: [facility] 204A Place of Service:  SNF [phone]) Provider:  Abdul Fine, MD  Patient Care Team: Abdul Fine, MD as PCP - General (Family Medicine) [name], MD as PCP - Cardiology (Cardiology) Myra Rosaline FALCON, [facility] (Inactive) as Pharmacist (Pharmacist) [name], MD as Consulting Physician (Ophthalmology)  Extended Emergency Contact Information Primary Emergency Contact: Crew,Fred Address: [address]           [address] [place] Home Phone: [phone] Relation: Spouse Secondary Emergency Contact: Jones,Nancy Mobile Phone: [phone] Relation: Daughter Preferred language: English  Code Status:  Full Code Goals of care: Advanced Directive information    09/29/2023   11:21 AM  Advanced Directives  Does Patient Have a Medical Advance Directive? Yes  Type of Advance Directive Living will  Does patient want to make changes to medical advance directive? No - Patient declined     Chief Complaint  Patient presents with   Acute Visit    Anxiety    HPI:  Pt is a 83 y.o. female seen today for an acute visit for Anxiety. Patient states she spoke to her husband this morning. She states she is frustrated that decisions are being made without her input. She says she doesn't know how she feels about how things are going. She then states, she hasn't spoken to her husband at all today. She cannot give the day, month, year, location.   Nursing states patient has had increased anxiety and has been difficult to redirect. She forgets what is said within moments of a conversation.    Past Medical History:  Diagnosis Date   Actinic keratosis 06/25/2020   L pretibia distal - bx proven    Balance problems    BCC (basal cell carcinoma of skin) 12/17/2020   right temple, Moh's 01/19/2021   Brain tumor (HCC)    a. Left intraventricular tumor ->stable by 06/2017 MRI. Followed @  [facility].   Dysplastic nevus 06/25/2020   R flank - moderate   Gait disturbance    a. Unsteady on feet/balance difficulty.   Generalized osteoarthritis of multiple sites    GERD (gastroesophageal reflux disease)    History of DVT (deep vein thrombosis) 2016   estrogen and airflights. Rx with xarelto  for 3 months   History of stress test    a. 11/2016 ETT: No ST/T changes.  Performed to assess PAC/PVC burden with activity ->No ectopy noted.   Hypertension    Hyperthyroidism    treated briefly in college   Insomnia    Obesity    PAF (paroxysmal atrial fibrillation) (HCC)    a. 12/2016 Event Monitor: brief run of PAF-->CHA2DS2VASc = 5--> Xarelto .   Plantar fasciitis    Retinal tear of left eye    Rosacea    SCC (squamous cell carcinoma) 08/12/2021   left chest treated with ED& C 04/06/22   Squamous cell carcinoma of skin 09/07/2017   R dorsum of hand    Type 2 diabetes, controlled, with peripheral neuropathy (HCC)    a. 11/2016 A1c 7.4.   Uterine fibroid    Past Surgical History:  Procedure Laterality Date   CARPAL TUNNEL RELEASE Right    Dr Sissy   CATARACT EXTRACTION     Gamma knife  07/2020   intraventricular mass---meningioma or choroid plexus papilloma   RETINAL TEAR REPAIR CRYOTHERAPY     10/09/13, then again 3/15    Allergies  Allergen Reactions   Penicillins  Has patient had a PCN reaction causing immediate rash, facial/tongue/throat swelling, SOB or lightheadedness with hypotension: Yes Has patient had a PCN reaction causing severe rash involving mucus membranes or skin necrosis: Yes Has patient had a PCN reaction that required hospitalization No Has patient had a PCN reaction occurring within the last 10 years: No If all of the above answers are NO, then may proceed with Cephalosporin use.     Sulfa Antibiotics     Rash/itching   Azithromycin Palpitations    Outpatient Encounter Medications as of 09/29/2023  Medication Sig   empagliflozin  (JARDIANCE ) 25 MG  TABS tablet Take 1 tablet (25 mg total) by mouth daily before breakfast.   glimepiride  (AMARYL ) 4 MG tablet Take 1 tablet (4 mg total) by mouth daily before breakfast.   losartan -hydrochlorothiazide  (HYZAAR) 100-12.5 MG tablet Take 1 tablet by mouth daily.   metoprolol  succinate (TOPROL -XL) 25 MG 24 hr tablet Take 1 tablet (25 mg total) by mouth daily.   Multiple Vitamin (MULTIVITAMIN) tablet Take 1 tablet by mouth daily.   rivaroxaban  (XARELTO ) 20 MG TABS tablet Take 1 tablet (20 mg total) by mouth daily with supper.   sertraline  (ZOLOFT ) 50 MG tablet Take 1 tablet (50 mg total) by mouth daily.   traZODone  (DESYREL ) 50 MG tablet Take 50 mg by mouth at bedtime.   [DISCONTINUED] traZODone  (DESYREL ) 50 MG tablet Take 25 mg by mouth 3 (three) times daily as needed.   No facility-administered encounter medications on file as of 09/29/2023.    Review of Systems  Immunization History  Administered Date(s) Administered   Influenza, High Dose Seasonal PF 07/09/2014, 07/23/2015, 06/02/2016, 07/02/2019, 07/13/2022   Influenza,inj,Quad PF,6+ Mos 06/23/2017, 07/17/2018   Influenza-Unspecified 07/23/2015, 06/22/2020, 07/08/2021, 07/20/2023   Moderna Covid-19 Fall Seasonal Vaccine 1yrs & older 01/12/2023   Moderna Covid-19 Vaccine  Bivalent Booster 72yrs & up 02/22/2022   Moderna Sars-Covid-2 Vaccination 10/08/2019, 11/05/2019, 07/24/2020, 02/28/2021, 08/05/2022   Pfizer Covid-19 Vaccine Bivalent Booster 78yrs & up 06/17/2021   Pneumococcal Conjugate-13 01/30/2014   Pneumococcal Polysaccharide-23 09/13/2004, 11/03/2016   RSV,unspecified 09/13/2023   Tdap 10/23/2012, 02/08/2023   Unspecified SARS-COV-2 Vaccination 06/23/2023   Zoster Recombinant(Shingrix) 11/19/2018, 04/11/2019   Zoster, Live 10/19/2006   Pertinent  Health Maintenance Due  Topic Date Due   FOOT EXAM  02/06/2024   OPHTHALMOLOGY EXAM  03/06/2024   HEMOGLOBIN A1C  03/07/2024   INFLUENZA VACCINE  Completed   DEXA SCAN  Completed    MAMMOGRAM  Discontinued      10/09/2019    8:51 AM 10/14/2020   10:47 AM 10/29/2021    9:41 AM 08/03/2022    1:10 PM 02/28/2023    9:50 AM  Fall Risk  Falls in the past year? 0 0 0 1 0  Was there an injury with Fall?    0 0  Fall Risk Category Calculator    2 0  Fall Risk Category (Retired)    Moderate   (RETIRED) Patient Fall Risk Level    Moderate fall risk   Patient at Risk for Falls Due to    History of fall(s)   Fall risk Follow up    Falls evaluation completed    Functional Status Survey:    Vitals:   09/29/23 1115  BP: 137/71  Pulse: 87  Resp: 18  Temp: (!) 97.4 F (36.3 C)  SpO2: 95%  Weight: 197 lb (89.4 kg)  Height: 5' 6 (1.676 m)   Body mass index is 31.8 kg/m. Physical Exam Constitutional:  Comments: Sitting in her wheelchair  Cardiovascular:     Rate and Rhythm: Normal rate.     Pulses: Normal pulses.  Pulmonary:     Effort: Pulmonary effort is normal.  Neurological:     Mental Status: She is alert. She is disoriented.     Labs reviewed: Recent Labs    02/13/23 0000 09/07/23 0800 09/09/23 1522 09/11/23 0000  NA 139 142 135 135*  K 4.1 3.9 3.5 3.8  CL 101 104 97* 98*  CO2 29 30 27  30*  GLUCOSE 139* 159* 323*  --   BUN 13 17 14 19   CREATININE 0.84 0.80 0.91 0.9  CALCIUM 9.3 9.1 9.0 9.1   Recent Labs    09/07/23 0800 09/09/23 1522  AST 11 41  ALT 11 24  ALKPHOS  --  63  BILITOT 0.5 0.4  PROT 6.2 7.4  ALBUMIN  --  4.2   Recent Labs    02/13/23 0000 09/07/23 0800 09/09/23 1522 09/11/23 0000  WBC 7.6 6.9 9.1 10.4  NEUTROABS 4,894 4,982 7.0 8,299.00  HGB 11.9 11.5* 12.3 11.8*  HCT 36.0 35.2 36.6 36  MCV 88.0 89.1 88.2  --   PLT 229 203 247 211   Lab Results  Component Value Date   TSH 2.930 04/04/2023   Lab Results  Component Value Date   HGBA1C 8.2 (H) 09/07/2023   Lab Results  Component Value Date   CHOL 167 02/13/2023   HDL 43 (L) 02/13/2023   LDLCALC 88 02/13/2023   LDLDIRECT 104.0 10/29/2021   TRIG 302 (H)  02/13/2023   CHOLHDL 3.9 02/13/2023    Significant Diagnostic Results in last 30 days:  DG Knee Complete 4 Views Right Result Date: 09/09/2023 CLINICAL DATA:  Fall, right knee pain EXAM: RIGHT KNEE - COMPLETE 4+ VIEW COMPARISON:  10/07/2020 FINDINGS: No evidence of fracture, dislocation, or joint effusion. Mild tricompartmental osteoarthritis. Soft tissues are unremarkable. IMPRESSION: Negative. Electronically Signed   By: Mabel Converse D.O.   On: 09/09/2023 17:28   CT HEAD WO CONTRAST ( ) Result Date: 09/09/2023 CLINICAL DATA:  Head trauma, minor (Age >= 65y); Neck trauma (Age >= 65y). Recurrent falls. History of brain tumor. EXAM: CT HEAD WITHOUT CONTRAST CT CERVICAL SPINE WITHOUT CONTRAST TECHNIQUE: Multidetector CT imaging of the head and cervical spine was performed following the standard protocol without intravenous contrast. Multiplanar CT image reconstructions of the cervical spine were also generated. RADIATION DOSE REDUCTION: This exam was performed according to the departmental dose-optimization program which includes automated exposure control, adjustment of the mA and/or kV according to patient size and/or use of iterative reconstruction technique. COMPARISON:  MRI brain 03/25/2023. FINDINGS: CT HEAD FINDINGS Brain: No acute hemorrhage. Unchanged mixed density mass centered within the trigone of the left lateral ventricle with hyperdense component measuring up to 3.0 cm (axial image 17 series 2), consistent with reported diagnosis of meningioma. Unchanged moderate chronic small-vessel disease. No new loss of gray-white differentiation. Stable size and configuration of the ventricles. No extra-axial collection or midline shift. Vascular: No hyperdense vessel or unexpected calcification. Skull: No calvarial fracture or suspicious bone lesion. Skull base is unremarkable. Sinuses/Orbits: No acute finding. Other: None. CT CERVICAL SPINE FINDINGS Alignment: Normal. Skull base and vertebrae: No  acute fracture. Normal craniocervical junction. No suspicious bone lesions. Soft tissues and spinal canal: No prevertebral fluid or swelling. No visible canal hematoma. Disc levels: Multilevel cervical spondylosis, worst at C5-6, where there is at least mild spinal canal stenosis. Upper chest: No acute  findings. Other: None. IMPRESSION: 1. No acute intracranial abnormality. 2. No acute cervical spine fracture or traumatic listhesis. 3. Unchanged mixed density mass centered within the trigone of the left lateral ventricle, consistent with reported diagnosis of meningioma. Electronically Signed   By: Ryan Chess M.D.   On: 09/09/2023 16:14   CT Cervical Spine Wo Contrast Result Date: 09/09/2023 CLINICAL DATA:  Head trauma, minor (Age >= 65y); Neck trauma (Age >= 65y). Recurrent falls. History of brain tumor. EXAM: CT HEAD WITHOUT CONTRAST CT CERVICAL SPINE WITHOUT CONTRAST TECHNIQUE: Multidetector CT imaging of the head and cervical spine was performed following the standard protocol without intravenous contrast. Multiplanar CT image reconstructions of the cervical spine were also generated. RADIATION DOSE REDUCTION: This exam was performed according to the departmental dose-optimization program which includes automated exposure control, adjustment of the mA and/or kV according to patient size and/or use of iterative reconstruction technique. COMPARISON:  MRI brain 03/25/2023. FINDINGS: CT HEAD FINDINGS Brain: No acute hemorrhage. Unchanged mixed density mass centered within the trigone of the left lateral ventricle with hyperdense component measuring up to 3.0 cm (axial image 17 series 2), consistent with reported diagnosis of meningioma. Unchanged moderate chronic small-vessel disease. No new loss of gray-white differentiation. Stable size and configuration of the ventricles. No extra-axial collection or midline shift. Vascular: No hyperdense vessel or unexpected calcification. Skull: No calvarial fracture  or suspicious bone lesion. Skull base is unremarkable. Sinuses/Orbits: No acute finding. Other: None. CT CERVICAL SPINE FINDINGS Alignment: Normal. Skull base and vertebrae: No acute fracture. Normal craniocervical junction. No suspicious bone lesions. Soft tissues and spinal canal: No prevertebral fluid or swelling. No visible canal hematoma. Disc levels: Multilevel cervical spondylosis, worst at C5-6, where there is at least mild spinal canal stenosis. Upper chest: No acute findings. Other: None. IMPRESSION: 1. No acute intracranial abnormality. 2. No acute cervical spine fracture or traumatic listhesis. 3. Unchanged mixed density mass centered within the trigone of the left lateral ventricle, consistent with reported diagnosis of meningioma. Electronically Signed   By: Ryan Chess M.D.   On: 09/09/2023 16:14    Assessment/Plan Moderate dementia with anxiety, unspecified dementia type (HCC) - Plan: traZODone  (DESYREL ) 50 MG tablet, CBC and differential, CBC, Basic metabolic panel, Comprehensive metabolic panel, sertraline  (ZOLOFT ) 50 MG tablet Patient with severe short term memory loss. Within 2-3 minutes, she does not recall a conversation. She has anxiety as a result. Will plan to start zoloft  25 mg daily for 3 days then increase to 50 mg x1 week. Finally increase to 100 mg daily and f/u in 1 month.   Family/ staff Communication: nursing  Labs/tests ordered:  none

## 2023-09-30 MED ORDER — SERTRALINE HCL 50 MG PO TABS
50.0000 mg | ORAL_TABLET | Freq: Every day | ORAL | Status: DC
Start: 2023-09-30 — End: 2023-10-20

## 2023-10-04 ENCOUNTER — Other Ambulatory Visit: Payer: Self-pay | Admitting: *Deleted

## 2023-10-04 DIAGNOSIS — Z9181 History of falling: Secondary | ICD-10-CM | POA: Diagnosis not present

## 2023-10-04 DIAGNOSIS — R41841 Cognitive communication deficit: Secondary | ICD-10-CM | POA: Diagnosis not present

## 2023-10-04 DIAGNOSIS — I10 Essential (primary) hypertension: Secondary | ICD-10-CM | POA: Diagnosis not present

## 2023-10-04 DIAGNOSIS — D329 Benign neoplasm of meninges, unspecified: Secondary | ICD-10-CM | POA: Diagnosis not present

## 2023-10-04 DIAGNOSIS — E1122 Type 2 diabetes mellitus with diabetic chronic kidney disease: Secondary | ICD-10-CM | POA: Diagnosis not present

## 2023-10-04 DIAGNOSIS — M1711 Unilateral primary osteoarthritis, right knee: Secondary | ICD-10-CM | POA: Diagnosis not present

## 2023-10-04 DIAGNOSIS — F03B18 Unspecified dementia, moderate, with other behavioral disturbance: Secondary | ICD-10-CM | POA: Diagnosis not present

## 2023-10-04 DIAGNOSIS — M6281 Muscle weakness (generalized): Secondary | ICD-10-CM | POA: Diagnosis not present

## 2023-10-04 NOTE — Patient Outreach (Signed)
 Post-Acute Care Manager follow up. Mrs. Ferrufino utilized [facility] SNF waiver for admission to [facility].   Secure communication sent to Alfonso, [employer], to verify transition date from skilled care.   Pablo Hurst, MSN, RN, BSN [place]  [facility] RN Post- Acute Care Manager Direct Dial: [phone]

## 2023-10-05 DIAGNOSIS — E1122 Type 2 diabetes mellitus with diabetic chronic kidney disease: Secondary | ICD-10-CM | POA: Diagnosis not present

## 2023-10-05 DIAGNOSIS — F03B18 Unspecified dementia, moderate, with other behavioral disturbance: Secondary | ICD-10-CM | POA: Diagnosis not present

## 2023-10-05 DIAGNOSIS — M1711 Unilateral primary osteoarthritis, right knee: Secondary | ICD-10-CM | POA: Diagnosis not present

## 2023-10-05 DIAGNOSIS — I10 Essential (primary) hypertension: Secondary | ICD-10-CM | POA: Diagnosis not present

## 2023-10-05 DIAGNOSIS — Z9181 History of falling: Secondary | ICD-10-CM | POA: Diagnosis not present

## 2023-10-05 DIAGNOSIS — D329 Benign neoplasm of meninges, unspecified: Secondary | ICD-10-CM | POA: Diagnosis not present

## 2023-10-06 ENCOUNTER — Ambulatory Visit: Payer: Medicare Other | Admitting: Student

## 2023-10-06 DIAGNOSIS — F03B18 Unspecified dementia, moderate, with other behavioral disturbance: Secondary | ICD-10-CM | POA: Diagnosis not present

## 2023-10-06 DIAGNOSIS — D329 Benign neoplasm of meninges, unspecified: Secondary | ICD-10-CM | POA: Diagnosis not present

## 2023-10-06 DIAGNOSIS — E1122 Type 2 diabetes mellitus with diabetic chronic kidney disease: Secondary | ICD-10-CM | POA: Diagnosis not present

## 2023-10-06 DIAGNOSIS — I10 Essential (primary) hypertension: Secondary | ICD-10-CM | POA: Diagnosis not present

## 2023-10-06 DIAGNOSIS — Z9181 History of falling: Secondary | ICD-10-CM | POA: Diagnosis not present

## 2023-10-06 DIAGNOSIS — M1711 Unilateral primary osteoarthritis, right knee: Secondary | ICD-10-CM | POA: Diagnosis not present

## 2023-10-10 ENCOUNTER — Other Ambulatory Visit: Payer: Self-pay | Admitting: *Deleted

## 2023-10-10 DIAGNOSIS — D329 Benign neoplasm of meninges, unspecified: Secondary | ICD-10-CM | POA: Diagnosis not present

## 2023-10-10 DIAGNOSIS — E1122 Type 2 diabetes mellitus with diabetic chronic kidney disease: Secondary | ICD-10-CM | POA: Diagnosis not present

## 2023-10-10 DIAGNOSIS — Z9181 History of falling: Secondary | ICD-10-CM | POA: Diagnosis not present

## 2023-10-10 DIAGNOSIS — M1711 Unilateral primary osteoarthritis, right knee: Secondary | ICD-10-CM | POA: Diagnosis not present

## 2023-10-10 DIAGNOSIS — I10 Essential (primary) hypertension: Secondary | ICD-10-CM | POA: Diagnosis not present

## 2023-10-10 DIAGNOSIS — F03B18 Unspecified dementia, moderate, with other behavioral disturbance: Secondary | ICD-10-CM | POA: Diagnosis not present

## 2023-10-10 NOTE — Patient Outreach (Addendum)
 Post- Acute Care Manager follow up.   Secure message sent to Alfonso, Admissions Director to confirm dc date from  [facility]. Mrs. Gortney utilized [facility] SN[facility] waiver for admission to SNF.   Will await for response.   Addendum: Update from Alfonso Lavella Eric Admissions Director. Mrs. Insco transitioned to LTC on 09/30/23.  No identifiable care management needs.   Pablo Hurst, MSN, RN, BSN [place]  [facility], [facility] RN Post- Acute Care Manager Direct Dial: [phone]

## 2023-10-12 DIAGNOSIS — F03B18 Unspecified dementia, moderate, with other behavioral disturbance: Secondary | ICD-10-CM | POA: Diagnosis not present

## 2023-10-12 DIAGNOSIS — Z9181 History of falling: Secondary | ICD-10-CM | POA: Diagnosis not present

## 2023-10-12 DIAGNOSIS — I10 Essential (primary) hypertension: Secondary | ICD-10-CM | POA: Diagnosis not present

## 2023-10-12 DIAGNOSIS — M1711 Unilateral primary osteoarthritis, right knee: Secondary | ICD-10-CM | POA: Diagnosis not present

## 2023-10-12 DIAGNOSIS — E1122 Type 2 diabetes mellitus with diabetic chronic kidney disease: Secondary | ICD-10-CM | POA: Diagnosis not present

## 2023-10-12 DIAGNOSIS — D329 Benign neoplasm of meninges, unspecified: Secondary | ICD-10-CM | POA: Diagnosis not present

## 2023-10-13 ENCOUNTER — Non-Acute Institutional Stay: Payer: Self-pay | Admitting: Student

## 2023-10-13 ENCOUNTER — Encounter: Payer: Self-pay | Admitting: Student

## 2023-10-13 DIAGNOSIS — Z91199 Patient's noncompliance with other medical treatment and regimen due to unspecified reason: Secondary | ICD-10-CM

## 2023-10-13 NOTE — Progress Notes (Unsigned)
Location:  [facility] [place].  Nursing Home Room Number: [facility] 204A Place of Service:  SNF [phone]) Provider:  [name], MD  Patient Care Team: [name], MD as PCP - General (Family Medicine) Antonieta Iba, MD as PCP - Cardiology (Cardiology) [name], [facility] (Inactive) as Pharmacist (Pharmacist) [name], MD as Consulting Physician (Ophthalmology)  Extended Emergency Contact Information Primary Emergency Contact: Gerwig,Fred Address: [phone]          [place] [address] [place] Home Phone: [phone] Relation: Spouse Secondary Emergency Contact: Jones,Nancy Mobile Phone: [phone] Relation: Daughter Preferred language: English  Code Status:  Full Code.  Goals of care: Advanced Directive information    10/13/2023   10:07 AM  Advanced Directives  Does Patient Have a Medical Advance Directive? Yes  Type of Advance Directive Living will  Does patient want to make changes to medical advance directive? No - Patient declined     Chief Complaint  Patient presents with   Medical Management of Chronic Issues    Medical Management of Chronic Issues.     HPI:  Pt is a 83 y.o. female seen today for medical management of chronic diseases.     Past Medical History:  Diagnosis Date   Actinic keratosis 06/25/2020   L pretibia distal - bx proven    Balance problems    BCC (basal cell carcinoma of skin) 12/17/2020   right temple, Moh's 01/19/2021   Brain tumor (HCC)    a. Left intraventricular tumor ->stable by 06/2017 MRI. Followed @ [facility].   Dysplastic nevus 06/25/2020   R flank - moderate   Gait disturbance    a. Unsteady on feet/balance difficulty.   Generalized osteoarthritis of multiple sites    GERD (gastroesophageal reflux disease)    History of DVT (deep vein thrombosis) 2016   estrogen and airflights. Rx with xarelto for 3 months   History of stress test    a. 11/2016 ETT: No ST/T changes.  Performed  to assess PAC/PVC burden with activity ->No ectopy noted.   Hypertension    Hyperthyroidism    treated briefly in college   Insomnia    Obesity    PAF (paroxysmal atrial fibrillation) (HCC)    a. 12/2016 Event Monitor: brief run of PAF-->CHA2DS2VASc = 5--> Xarelto.   Plantar fasciitis    Retinal tear of left eye    Rosacea    SCC (squamous cell carcinoma) 08/12/2021   left chest treated with ED& C 04/06/22   Squamous cell carcinoma of skin 09/07/2017   R dorsum of hand    Type 2 diabetes, controlled, with peripheral neuropathy (HCC)    a. 11/2016 A1c 7.4.   Uterine fibroid    Past Surgical History:  Procedure Laterality Date   CARPAL TUNNEL RELEASE Right    Dr Mina Marble   CATARACT EXTRACTION     Gamma knife  07/2020   intraventricular mass---meningioma or choroid plexus papilloma   RETINAL TEAR REPAIR CRYOTHERAPY     10/09/13, then again 3/15    Allergies  Allergen Reactions   Penicillins     Has patient had a PCN reaction causing immediate rash, facial/tongue/throat swelling, SOB or lightheadedness with hypotension: Yes Has patient had a PCN reaction causing severe rash involving mucus membranes or skin necrosis: Yes Has patient had a PCN reaction that required hospitalization No Has patient had a PCN reaction occurring within the last 10 years: No If all of the above answers are "NO", then  may proceed with Cephalosporin use.     Sulfa Antibiotics     Rash/itching   Azithromycin Palpitations    Outpatient Encounter Medications as of 10/13/2023  Medication Sig   empagliflozin (JARDIANCE) 25 MG TABS tablet Take 1 tablet (25 mg total) by mouth daily before breakfast.   glimepiride (AMARYL) 4 MG tablet Take 1 tablet (4 mg total) by mouth daily before breakfast.   losartan-hydrochlorothiazide (HYZAAR) 100-12.5 MG tablet Take 1 tablet by mouth daily.   metoprolol succinate (TOPROL-XL) 25 MG 24 hr tablet Take 1 tablet (25 mg total) by mouth daily.   Multiple Vitamin  (MULTIVITAMIN) tablet Take 1 tablet by mouth daily.   rivaroxaban (XARELTO) 20 MG TABS tablet Take 1 tablet (20 mg total) by mouth daily with supper.   sertraline (ZOLOFT) 50 MG tablet Take 1 tablet (50 mg total) by mouth daily.   traZODone (DESYREL) 50 MG tablet Take 50 mg by mouth at bedtime.   No facility-administered encounter medications on file as of 10/13/2023.    Review of Systems  Immunization History  Administered Date(s) Administered   Influenza, High Dose Seasonal PF 07/09/2014, 07/23/2015, 06/02/2016, 07/02/2019, 07/13/2022   Influenza,inj,Quad PF,6+ Mos 06/23/2017, 07/17/2018   Influenza-Unspecified 07/23/2015, 06/22/2020, 07/08/2021, 07/20/2023   Moderna Covid-19 Fall Seasonal Vaccine 73yrs & older 01/12/2023   Moderna Covid-19 Vaccine Bivalent Booster 28yrs & up 02/22/2022   Moderna Sars-Covid-2 Vaccination 10/08/2019, 11/05/2019, 07/24/2020, 02/28/2021, 08/05/2022   Pfizer Covid-19 Vaccine Bivalent Booster 56yrs & up 06/17/2021   Pneumococcal Conjugate-13 01/30/2014   Pneumococcal Polysaccharide-23 09/13/2004, 11/03/2016   RSV,unspecified 09/13/2023   Tdap 10/23/2012, 02/08/2023   Unspecified SARS-COV-2 Vaccination 06/23/2023   Zoster Recombinant(Shingrix) 11/19/2018, 04/11/2019   Zoster, Live 10/19/2006   Pertinent  Health Maintenance Due  Topic Date Due   FOOT EXAM  02/06/2024   OPHTHALMOLOGY EXAM  03/06/2024   HEMOGLOBIN A1C  03/07/2024   INFLUENZA VACCINE  Completed   DEXA SCAN  Completed   MAMMOGRAM  Discontinued      10/09/2019    8:51 AM 10/14/2020   10:47 AM 10/29/2021    9:41 AM 08/03/2022    1:10 PM 02/28/2023    9:50 AM  Fall Risk  Falls in the past year? 0 0 0 1 0  Was there an injury with Fall?    0 0  Fall Risk Category Calculator    2 0  Fall Risk Category (Retired)    Moderate   (RETIRED) Patient Fall Risk Level    Moderate fall risk   Patient at Risk for Falls Due to    History of fall(s)   Fall risk Follow up    Falls evaluation completed     Functional Status Survey:    Vitals:   10/13/23 1003  BP: 137/71  Pulse: 87  Resp: 18  Temp: (!) 97.4 F (36.3 C)  SpO2: 95%  Weight: 197 lb (89.4 kg)  Height: 5\' 6"  (1.676 m)   Body mass index is 31.8 kg/m. Physical Exam  Labs reviewed: Recent Labs    02/13/23 0000 09/07/23 0800 09/09/23 1522 09/11/23 0000  NA 139 142 135 135*  K 4.1 3.9 3.5 3.8  CL 101 104 97* 98*  CO2 29 30 27  30*  GLUCOSE 139* 159* 323*  --   BUN 13 17 14 19   CREATININE 0.84 0.80 0.91 0.9  CALCIUM 9.3 9.1 9.0 9.1   Recent Labs    09/07/23 0800 09/09/23 1522  AST 11 41  ALT 11 24  ALKPHOS  --  63  BILITOT 0.5 0.4  PROT 6.2 7.4  ALBUMIN  --  4.2   Recent Labs    02/13/23 0000 09/07/23 0800 09/09/23 1522 09/11/23 0000  WBC 7.6 6.9 9.1 10.4  NEUTROABS 4,894 4,982 7.0 8,299.00  HGB 11.9 11.5* 12.3 11.8*  HCT 36.0 35.2 36.6 36  MCV 88.0 89.1 88.2  --   PLT 229 203 247 211   Lab Results  Component Value Date   TSH 2.930 04/04/2023   Lab Results  Component Value Date   HGBA1C 8.2 (H) 09/07/2023   Lab Results  Component Value Date   CHOL 167 02/13/2023   HDL 43 (L) 02/13/2023   LDLCALC 88 02/13/2023   LDLDIRECT 104.0 10/29/2021   TRIG 302 (H) 02/13/2023   CHOLHDL 3.9 02/13/2023    Significant Diagnostic Results in last 30 days:  No results found.  Assessment/Plan There are no diagnoses linked to this encounter.   Family/ staff Communication: ***  Labs/tests ordered:  ***

## 2023-10-18 DIAGNOSIS — F03B18 Unspecified dementia, moderate, with other behavioral disturbance: Secondary | ICD-10-CM | POA: Diagnosis not present

## 2023-10-18 DIAGNOSIS — I10 Essential (primary) hypertension: Secondary | ICD-10-CM | POA: Diagnosis not present

## 2023-10-18 DIAGNOSIS — M1711 Unilateral primary osteoarthritis, right knee: Secondary | ICD-10-CM | POA: Diagnosis not present

## 2023-10-18 DIAGNOSIS — D329 Benign neoplasm of meninges, unspecified: Secondary | ICD-10-CM | POA: Diagnosis not present

## 2023-10-18 DIAGNOSIS — Z9181 History of falling: Secondary | ICD-10-CM | POA: Diagnosis not present

## 2023-10-18 DIAGNOSIS — E1122 Type 2 diabetes mellitus with diabetic chronic kidney disease: Secondary | ICD-10-CM | POA: Diagnosis not present

## 2023-10-19 DIAGNOSIS — E1122 Type 2 diabetes mellitus with diabetic chronic kidney disease: Secondary | ICD-10-CM | POA: Diagnosis not present

## 2023-10-19 DIAGNOSIS — F03B18 Unspecified dementia, moderate, with other behavioral disturbance: Secondary | ICD-10-CM | POA: Diagnosis not present

## 2023-10-19 DIAGNOSIS — M1711 Unilateral primary osteoarthritis, right knee: Secondary | ICD-10-CM | POA: Diagnosis not present

## 2023-10-19 DIAGNOSIS — I10 Essential (primary) hypertension: Secondary | ICD-10-CM | POA: Diagnosis not present

## 2023-10-19 DIAGNOSIS — D329 Benign neoplasm of meninges, unspecified: Secondary | ICD-10-CM | POA: Diagnosis not present

## 2023-10-19 DIAGNOSIS — Z9181 History of falling: Secondary | ICD-10-CM | POA: Diagnosis not present

## 2023-10-20 ENCOUNTER — Encounter: Payer: Self-pay | Admitting: Student

## 2023-10-20 ENCOUNTER — Non-Acute Institutional Stay (SKILLED_NURSING_FACILITY): Payer: Self-pay | Admitting: Student

## 2023-10-20 DIAGNOSIS — D329 Benign neoplasm of meninges, unspecified: Secondary | ICD-10-CM | POA: Diagnosis not present

## 2023-10-20 DIAGNOSIS — R270 Ataxia, unspecified: Secondary | ICD-10-CM

## 2023-10-20 DIAGNOSIS — E1142 Type 2 diabetes mellitus with diabetic polyneuropathy: Secondary | ICD-10-CM | POA: Diagnosis not present

## 2023-10-20 DIAGNOSIS — I480 Paroxysmal atrial fibrillation: Secondary | ICD-10-CM | POA: Diagnosis not present

## 2023-10-20 DIAGNOSIS — F03B4 Unspecified dementia, moderate, with anxiety: Secondary | ICD-10-CM

## 2023-10-20 DIAGNOSIS — I10 Essential (primary) hypertension: Secondary | ICD-10-CM

## 2023-10-20 NOTE — Progress Notes (Unsigned)
Location:  Other [place].  Nursing Home Room Number: [facility] 204A Place of Service:  SNF [phone]) Provider:  [name], MD  Patient Care Team: [name], MD as PCP - General (Family Medicine) Antonieta Iba, MD as PCP - Cardiology (Cardiology) [name], [facility] (Inactive) as Pharmacist (Pharmacist) [name], MD as Consulting Physician (Ophthalmology)  Extended Emergency Contact Information Primary Emergency Contact: Altier,Fred Address: [phone]          [place] [address] [place] Home Phone: [phone] Relation: Spouse Secondary Emergency Contact: Jones,Nancy Mobile Phone: [phone] Relation: Daughter Preferred language: English  Code Status:  Full Code.  Goals of care: Advanced Directive information    10/20/2023    1:46 PM  Advanced Directives  Does Patient Have a Medical Advance Directive? Yes  Type of Advance Directive Living will  Does patient want to make changes to medical advance directive? No - Patient declined     Chief Complaint  Patient presents with   Medical Management of Chronic Issues    Medical Management of Chronic Issues.     HPI:  Pt is a 83 y.o. female seen today for medical management of chronic diseases.   Discussed the use of AI scribe software for clinical note transcription with the patient, who gave verbal consent to proceed.  History of Present Illness The patient, with a history of dementia and recent falls, was admitted to long-term care after numerous falls at home and the need for 24-hour surveillance. She has significant memory loss, likely worsening rapidly due to a meningioma in her ventricles that has been treated multiple times with gamma knife but will not receive further treatment. Recently, the patient suffered from gastroenteritis with vomiting and diarrhea, which has since resolved. However, this illness has led to poor food intake and low activity levels, resulting  in a weight loss of 10 pounds.  She says her tummy hurts today. She doesn't feel like doing anything, then she says the nurse gives good help and she would like to get up at this time.   Per Nursing: The patient's mood has been impacted by the absence of her husband's daily visits. The patient also has a history of diabetes with previous hyperglycemia due to poor diet adherence. However, she has experienced hypoglycemia twice in the past few days.   The patient is also on trazodone for sleep, Xarelto for atrial fibrillation, Januvia for diabetes, metoprolol for atrial fibrillation, and Hyzaar for hypertension. Her blood pressure is well controlled on the current regimen. However, she has severe ataxia and has had difficulty progressing with physical therapy, leading to discontinuation of therapy and likely continued wheelchair dependence. Past Medical History:  Diagnosis Date   Actinic keratosis 06/25/2020   L pretibia distal - bx proven    Balance problems    BCC (basal cell carcinoma of skin) 12/17/2020   right temple, Moh's 01/19/2021   Brain tumor (HCC)    a. Left intraventricular tumor ->stable by 06/2017 MRI. Followed @ [facility].   Dysplastic nevus 06/25/2020   R flank - moderate   Gait disturbance    a. Unsteady on feet/balance difficulty.   Generalized osteoarthritis of multiple sites    GERD (gastroesophageal reflux disease)    History of DVT (deep vein thrombosis) 2016   estrogen and airflights. Rx with xarelto for 3 months   History of stress test    a. 11/2016 ETT: No ST/T changes.  Performed to assess PAC/PVC burden with activity ->  No ectopy noted.   Hypertension    Hyperthyroidism    treated briefly in college   Insomnia    Obesity    PAF (paroxysmal atrial fibrillation) (HCC)    a. 12/2016 Event Monitor: brief run of PAF-->CHA2DS2VASc = 5--> Xarelto.   Plantar fasciitis    Retinal tear of left eye    Rosacea    SCC (squamous cell carcinoma) 08/12/2021   left chest treated  with ED& C 04/06/22   Squamous cell carcinoma of skin 09/07/2017   R dorsum of hand    Type 2 diabetes, controlled, with peripheral neuropathy (HCC)    a. 11/2016 A1c 7.4.   Uterine fibroid    Past Surgical History:  Procedure Laterality Date   CARPAL TUNNEL RELEASE Right    Dr Mina Marble   CATARACT EXTRACTION     Gamma knife  07/2020   intraventricular mass---meningioma or choroid plexus papilloma   RETINAL TEAR REPAIR CRYOTHERAPY     10/09/13, then again 3/15    Allergies  Allergen Reactions   Penicillins     Has patient had a PCN reaction causing immediate rash, facial/tongue/throat swelling, SOB or lightheadedness with hypotension: Yes Has patient had a PCN reaction causing severe rash involving mucus membranes or skin necrosis: Yes Has patient had a PCN reaction that required hospitalization No Has patient had a PCN reaction occurring within the last 10 years: No If all of the above answers are "NO", then may proceed with Cephalosporin use.     Sulfa Antibiotics     Rash/itching   Azithromycin Palpitations    Outpatient Encounter Medications as of 10/20/2023  Medication Sig   empagliflozin (JARDIANCE) 25 MG TABS tablet Take 1 tablet (25 mg total) by mouth daily before breakfast.   losartan-hydrochlorothiazide (HYZAAR) 100-12.5 MG tablet Take 1 tablet by mouth daily.   metoprolol succinate (TOPROL-XL) 25 MG 24 hr tablet Take 1 tablet (25 mg total) by mouth daily.   Multiple Vitamin (MULTIVITAMIN) tablet Take 1 tablet by mouth daily.   ondansetron (ZOFRAN) 4 MG tablet Take 4 mg by mouth every 8 (eight) hours as needed for nausea or vomiting.   rivaroxaban (XARELTO) 20 MG TABS tablet Take 1 tablet (20 mg total) by mouth daily with supper.   sertraline (ZOLOFT) 50 MG tablet Take 100 mg by mouth at bedtime.   sitaGLIPtin (JANUVIA) 100 MG tablet Take 50 mg by mouth daily.   traZODone (DESYREL) 50 MG tablet Take 50 mg by mouth at bedtime.   [DISCONTINUED] glimepiride (AMARYL) 4 MG  tablet Take 1 tablet (4 mg total) by mouth daily before breakfast. (Patient not taking: Reported on 10/20/2023)   [DISCONTINUED] sertraline (ZOLOFT) 50 MG tablet Take 1 tablet (50 mg total) by mouth daily. (Patient taking differently: Take 100 mg by mouth daily.)   No facility-administered encounter medications on file as of 10/20/2023.    Review of Systems  Immunization History  Administered Date(s) Administered   Influenza, High Dose Seasonal PF 07/09/2014, 07/23/2015, 06/02/2016, 07/02/2019, 07/13/2022   Influenza,inj,Quad PF,6+ Mos 06/23/2017, 07/17/2018   Influenza-Unspecified 07/23/2015, 06/22/2020, 07/08/2021, 07/20/2023   Moderna Covid-19 Fall Seasonal Vaccine 58yrs & older 01/12/2023   Moderna Covid-19 Vaccine Bivalent Booster 63yrs & up 02/22/2022   Moderna Sars-Covid-2 Vaccination 10/08/2019, 11/05/2019, 07/24/2020, 02/28/2021, 08/05/2022   Pfizer Covid-19 Vaccine Bivalent Booster 19yrs & up 06/17/2021   Pneumococcal Conjugate-13 01/30/2014   Pneumococcal Polysaccharide-23 09/13/2004, 11/03/2016   RSV,unspecified 09/13/2023   Tdap 10/23/2012, 02/08/2023   Unspecified SARS-COV-2 Vaccination 06/23/2023  Zoster Recombinant(Shingrix) 11/19/2018, 04/11/2019   Zoster, Live 10/19/2006   Pertinent  Health Maintenance Due  Topic Date Due   FOOT EXAM  02/06/2024   OPHTHALMOLOGY EXAM  03/06/2024   HEMOGLOBIN A1C  03/07/2024   INFLUENZA VACCINE  Completed   DEXA SCAN  Completed   MAMMOGRAM  Discontinued      10/09/2019    8:51 AM 10/14/2020   10:47 AM 10/29/2021    9:41 AM 08/03/2022    1:10 PM 02/28/2023    9:50 AM  Fall Risk  Falls in the past year? 0 0 0 1 0  Was there an injury with Fall?    0 0  Fall Risk Category Calculator    2 0  Fall Risk Category (Retired)    Moderate   (RETIRED) Patient Fall Risk Level    Moderate fall risk   Patient at Risk for Falls Due to    History of fall(s)   Fall risk Follow up    Falls evaluation completed    Functional Status Survey:     Vitals:   10/20/23 1312  BP: 137/71  Pulse: 87  Resp: 18  Temp: (!) 97.4 F (36.3 C)  SpO2: 95%  Weight: 186 lb (84.4 kg)  Height: 5\' 6"  (1.676 m)   Body mass index is 30.02 kg/m. Physical Exam Cardiovascular:     Rate and Rhythm: Normal rate. Rhythm irregular.  Pulmonary:     Effort: Pulmonary effort is normal.  Abdominal:     General: Abdomen is flat. Bowel sounds are normal.     Palpations: Abdomen is soft.  Skin:    General: Skin is warm and dry.  Neurological:     Mental Status: She is alert. She is disoriented.     Labs reviewed: Recent Labs    02/13/23 0000 09/07/23 0800 09/09/23 1522 09/11/23 0000  NA 139 142 135 135*  K 4.1 3.9 3.5 3.8  CL 101 104 97* 98*  CO2 29 30 27  30*  GLUCOSE 139* 159* 323*  --   BUN 13 17 14 19   CREATININE 0.84 0.80 0.91 0.9  CALCIUM 9.3 9.1 9.0 9.1   Recent Labs    09/07/23 0800 09/09/23 1522  AST 11 41  ALT 11 24  ALKPHOS  --  63  BILITOT 0.5 0.4  PROT 6.2 7.4  ALBUMIN  --  4.2   Recent Labs    02/13/23 0000 09/07/23 0800 09/09/23 1522 09/11/23 0000  WBC 7.6 6.9 9.1 10.4  NEUTROABS 4,894 4,982 7.0 8,299.00  HGB 11.9 11.5* 12.3 11.8*  HCT 36.0 35.2 36.6 36  MCV 88.0 89.1 88.2  --   PLT 229 203 247 211   Lab Results  Component Value Date   TSH 2.930 04/04/2023   Lab Results  Component Value Date   HGBA1C 8.2 (H) 09/07/2023   Lab Results  Component Value Date   CHOL 167 02/13/2023   HDL 43 (L) 02/13/2023   LDLCALC 88 02/13/2023   LDLDIRECT 104.0 10/29/2021   TRIG 302 (H) 02/13/2023   CHOLHDL 3.9 02/13/2023    Significant Diagnostic Results in last 30 days:  No results found.  Assessment/Plan Assessment and Plan Dementia Significant memory loss with short-term memory deficits, likely due to meningioma. Requires 24-hour surveillance due to numerous falls. Poor food intake and low activity levels since recent gastroenteritis. Reliant on husband's daily visits, impacting mood when absent. No  additional treatment planned for meningioma. - remain in [place] for LTC -  Continue sertraline with up-titration to 100 mg over the next month - Discuss with nursing that mood can worsen before it improves  Recent Falls Numerous falls at home, likely related to dementia and severe ataxia. Requires 24-hour surveillance. - Admit to [facility] for long-term care  Severe Ataxia Difficulty progressing in physical therapy. Likely to remain wheelchair dependent. Physical therapy management discontinued. - Discontinue physical therapy management  Gastroenteritis (resolved) Recent episode of gastroenteritis with vomiting and diarrhea due to norovirus, now resolved. Poor food intake and weight loss of 10 pounds noted. - Monitor nutritional intake and weight  Diabetes Mellitus Diabetes with previous hyperglycemia due to poor diet adherence. Recent hypoglycemia likely due to glimepiride. - Discontinue glimepiride - Continue Januvia - Consider addition of another medication in the future - Avoid metformin due to intolerance  Atrial Fibrillation managed with Xarelto and metoprolol. - Continue Xarelto - Continue metoprolol 25 mg daily  Hypertension well-controlled on current regimen. - Continue Hyzaar 100/12.5 mg  Follow-up - Continue current medications and monitor for any changes in condition.  Family/ staff Communication: nursing  Labs/tests ordered:  nobe

## 2023-10-22 ENCOUNTER — Encounter: Payer: Self-pay | Admitting: Student

## 2023-10-23 DIAGNOSIS — F03B18 Unspecified dementia, moderate, with other behavioral disturbance: Secondary | ICD-10-CM | POA: Diagnosis not present

## 2023-10-23 DIAGNOSIS — F03B11 Unspecified dementia, moderate, with agitation: Secondary | ICD-10-CM | POA: Diagnosis not present

## 2023-10-23 DIAGNOSIS — F5105 Insomnia due to other mental disorder: Secondary | ICD-10-CM | POA: Diagnosis not present

## 2023-10-23 DIAGNOSIS — F03B3 Unspecified dementia, moderate, with mood disturbance: Secondary | ICD-10-CM | POA: Diagnosis not present

## 2023-10-23 DIAGNOSIS — F03B4 Unspecified dementia, moderate, with anxiety: Secondary | ICD-10-CM | POA: Diagnosis not present

## 2023-10-27 ENCOUNTER — Telehealth: Payer: Self-pay | Admitting: Student

## 2023-10-27 DIAGNOSIS — F03B18 Unspecified dementia, moderate, with other behavioral disturbance: Secondary | ICD-10-CM | POA: Diagnosis not present

## 2023-10-27 DIAGNOSIS — Z9181 History of falling: Secondary | ICD-10-CM | POA: Diagnosis not present

## 2023-10-27 DIAGNOSIS — M1711 Unilateral primary osteoarthritis, right knee: Secondary | ICD-10-CM | POA: Diagnosis not present

## 2023-10-27 DIAGNOSIS — E1122 Type 2 diabetes mellitus with diabetic chronic kidney disease: Secondary | ICD-10-CM | POA: Diagnosis not present

## 2023-10-27 DIAGNOSIS — I10 Essential (primary) hypertension: Secondary | ICD-10-CM | POA: Diagnosis not present

## 2023-10-27 DIAGNOSIS — D329 Benign neoplasm of meninges, unspecified: Secondary | ICD-10-CM | POA: Diagnosis not present

## 2023-10-27 NOTE — Telephone Encounter (Signed)
Contacted patient's spouse due to concern for weight loss. Patient has lost 20 lbs since admission. Encourage family visits during meal time to encourage PO intake. Encouraging supplementation. Nursing to relay message to spouse.

## 2023-11-01 DIAGNOSIS — B351 Tinea unguium: Secondary | ICD-10-CM | POA: Diagnosis not present

## 2023-11-01 DIAGNOSIS — M2041 Other hammer toe(s) (acquired), right foot: Secondary | ICD-10-CM | POA: Diagnosis not present

## 2023-11-01 DIAGNOSIS — M2042 Other hammer toe(s) (acquired), left foot: Secondary | ICD-10-CM | POA: Diagnosis not present

## 2023-11-01 DIAGNOSIS — E1151 Type 2 diabetes mellitus with diabetic peripheral angiopathy without gangrene: Secondary | ICD-10-CM | POA: Diagnosis not present

## 2023-11-02 ENCOUNTER — Non-Acute Institutional Stay (SKILLED_NURSING_FACILITY): Payer: Medicare Other | Admitting: Nurse Practitioner

## 2023-11-02 ENCOUNTER — Encounter: Payer: Self-pay | Admitting: Nurse Practitioner

## 2023-11-02 DIAGNOSIS — E1142 Type 2 diabetes mellitus with diabetic polyneuropathy: Secondary | ICD-10-CM | POA: Diagnosis not present

## 2023-11-02 DIAGNOSIS — R634 Abnormal weight loss: Secondary | ICD-10-CM

## 2023-11-02 NOTE — Progress Notes (Signed)
 Location:  Other [facility] [place].  Nursing Home Room Number: [facility] [address] Place of Service:  SNF [phone]) [name], NP  PCP: Abdul Fine, MD  Patient Care Team: Abdul Fine, MD as PCP - General (Family Medicine) [name], MD as PCP - Cardiology (Cardiology) Myra Rosaline FALCON, [facility] (Inactive) as Pharmacist (Pharmacist) [name], MD as Consulting Physician (Ophthalmology)  Extended Emergency Contact Information Primary Emergency Contact: Ricciardi,Fred Address: [address]          [place] [address] [place] Home Phone: [phone] Relation: Spouse Secondary Emergency Contact: Jones,Nancy Mobile Phone: [phone] Relation: Daughter Preferred language: English  Goals of care: Advanced Directive information    11/02/2023    9:23 AM  Advanced Directives  Does Patient Have a Medical Advance Directive? Yes  Type of Advance Directive Living will  Does patient want to make changes to medical advance directive? No - Patient declined     Chief Complaint  Patient presents with   Acute Visit    Increased Blood Sugar.     HPI:  Pt is a 83 y.o. female seen today for an acute visit for elevated Blood Sugar.   Her blood sugars are currently ranging from 240 to 270 mg/dL. She was previously on glipizide but was taken off in December due to episodes of hypoglycemia. Since discontinuation, her blood sugars have trended upwards.  She was started on Januvia, initially at 50 mg, which was later increased to 100 mg. Despite this adjustment, her blood sugars remain elevated. She is also on Jardiance  25 mg daily. She is intolerant to metformin  She is on nutritional supplements due to significant weight loss She refuses to drink the no sugar added med pass supplements. She is on regular supplements which she will use.   She has experienced weight loss due to a decreased appetite and reports eating minimally. There have been no episodes of  hypoglycemia in the past two weeks. She was started on Remeron and an appetite stimulant yesterday to address her decreased appetite.   Past Medical History:  Diagnosis Date   Actinic keratosis 06/25/2020   L pretibia distal - bx proven    Balance problems    BCC (basal cell carcinoma of skin) 12/17/2020   right temple, Moh's 01/19/2021   Brain tumor (HCC)    a. Left intraventricular tumor ->stable by 06/2017 MRI. Followed @ [facility].   Dysplastic nevus 06/25/2020   R flank - moderate   Gait disturbance    a. Unsteady on feet/balance difficulty.   Generalized osteoarthritis of multiple sites    GERD (gastroesophageal reflux disease)    History of DVT (deep vein thrombosis) 2016   estrogen and airflights. Rx with xarelto  for 3 months   History of stress test    a. 11/2016 ETT: No ST/T changes.  Performed to assess PAC/PVC burden with activity ->No ectopy noted.   Hypertension    Hyperthyroidism    treated briefly in college   Insomnia    Obesity    PAF (paroxysmal atrial fibrillation) (HCC)    a. 12/2016 Event Monitor: brief run of PAF-->CHA2DS2VASc = 5--> Xarelto .   Plantar fasciitis    Retinal tear of left eye    Rosacea    SCC (squamous cell carcinoma) 08/12/2021   left chest treated with ED& C 04/06/22   Squamous cell carcinoma of skin 09/07/2017   R dorsum of hand    Type 2 diabetes, controlled, with peripheral neuropathy (HCC)  a. 11/2016 A1c 7.4.   Uterine fibroid    Past Surgical History:  Procedure Laterality Date   CARPAL TUNNEL RELEASE Right    Dr Sissy   CATARACT EXTRACTION     Gamma knife  07/2020   intraventricular mass---meningioma or choroid plexus papilloma   RETINAL TEAR REPAIR CRYOTHERAPY     10/09/13, then again 3/15    Allergies  Allergen Reactions   Penicillins     Has patient had a PCN reaction causing immediate rash, facial/tongue/throat swelling, SOB or lightheadedness with hypotension: Yes Has patient had a PCN reaction causing severe rash  involving mucus membranes or skin necrosis: Yes Has patient had a PCN reaction that required hospitalization No Has patient had a PCN reaction occurring within the last 10 years: No If all of the above answers are NO, then may proceed with Cephalosporin use.     Sulfa Antibiotics     Rash/itching   Azithromycin Palpitations    Outpatient Encounter Medications as of 11/02/2023  Medication Sig   empagliflozin  (JARDIANCE ) 25 MG TABS tablet Take 1 tablet (25 mg total) by mouth daily before breakfast.   losartan -hydrochlorothiazide  (HYZAAR) 100-12.5 MG tablet Take 1 tablet by mouth daily.   metoprolol  succinate (TOPROL -XL) 25 MG 24 hr tablet Take 1 tablet (25 mg total) by mouth daily.   mirtazapine (REMERON) 7.5 MG tablet Take 7.5 mg by mouth at bedtime.   Multiple Vitamin (MULTIVITAMIN) tablet Take 1 tablet by mouth daily.   polyethylene glycol (MIRALAX / GLYCOLAX) 17 g packet Take 17 g by mouth 2 (two) times daily as needed.   rivaroxaban  (XARELTO ) 20 MG TABS tablet Take 1 tablet (20 mg total) by mouth daily with supper.   sertraline  (ZOLOFT ) 50 MG tablet Take 50 mg by mouth at bedtime.   sitaGLIPtin (JANUVIA) 100 MG tablet Take 100 mg by mouth daily.   traZODone  (DESYREL ) 50 MG tablet Take 50 mg by mouth at bedtime.   ondansetron (ZOFRAN) 4 MG tablet Take 4 mg by mouth every 8 (eight) hours as needed for nausea or vomiting. (Patient not taking: Reported on 11/02/2023)   No facility-administered encounter medications on file as of 11/02/2023.    Review of Systems  Unable to perform ROS: Dementia    Immunization History  Administered Date(s) Administered   Influenza, High Dose Seasonal PF 07/09/2014, 07/23/2015, 06/02/2016, 07/02/2019, 07/13/2022   Influenza,inj,Quad PF,6+ Mos 06/23/2017, 07/17/2018   Influenza-Unspecified 07/23/2015, 06/22/2020, 07/08/2021, 07/20/2023   Moderna Covid-19 Fall Seasonal Vaccine 74yrs & older 01/12/2023   Moderna Covid-19 Vaccine  Bivalent Booster 105yrs &  up 02/22/2022   Moderna Sars-Covid-2 Vaccination 10/08/2019, 11/05/2019, 07/24/2020, 02/28/2021, 08/05/2022   Pfizer Covid-19 Vaccine Bivalent Booster 22yrs & up 06/17/2021   Pneumococcal Conjugate-13 01/30/2014   Pneumococcal Polysaccharide-23 09/13/2004, 11/03/2016   RSV,unspecified 09/13/2023   Tdap 10/23/2012, 02/08/2023   Unspecified SARS-COV-2 Vaccination 06/23/2023   Zoster Recombinant(Shingrix) 11/19/2018, 04/11/2019   Zoster, Live 10/19/2006   Pertinent  Health Maintenance Due  Topic Date Due   FOOT EXAM  02/06/2024   OPHTHALMOLOGY EXAM  03/06/2024   HEMOGLOBIN A1C  03/07/2024   INFLUENZA VACCINE  Completed   DEXA SCAN  Completed   MAMMOGRAM  Discontinued      10/09/2019    8:51 AM 10/14/2020   10:47 AM 10/29/2021    9:41 AM 08/03/2022    1:10 PM 02/28/2023    9:50 AM  Fall Risk  Falls in the past year? 0 0 0 1 0  Was there an injury with  Fall?    0 0  Fall Risk Category Calculator    2 0  Fall Risk Category (Retired)    Moderate   (RETIRED) Patient Fall Risk Level    Moderate fall risk   Patient at Risk for Falls Due to    History of fall(s)   Fall risk Follow up    Falls evaluation completed    Functional Status Survey:    Vitals:   11/02/23 0917  BP: 137/71  Pulse: 87  Resp: 18  Temp: (!) 97.4 F (36.3 C)  SpO2: 95%  Weight: 185 lb 9.6 oz (84.2 kg)  Height: 5' 6 (1.676 m)   Body mass index is 29.96 kg/m. Physical Exam Constitutional:      General: She is not in acute distress.    Appearance: She is well-developed. She is not diaphoretic.  HENT:     Head: Normocephalic and atraumatic.     Mouth/Throat:     Pharynx: No oropharyngeal exudate.  Eyes:     Conjunctiva/sclera: Conjunctivae normal.     Pupils: Pupils are equal, round, and reactive to light.  Cardiovascular:     Rate and Rhythm: Normal rate and regular rhythm.     Heart sounds: Normal heart sounds.  Pulmonary:     Effort: Pulmonary effort is normal.     Breath sounds: Normal breath  sounds.  Abdominal:     General: Bowel sounds are normal.     Palpations: Abdomen is soft.  Musculoskeletal:     Cervical back: Normal range of motion and neck supple.     Right lower leg: No edema.     Left lower leg: No edema.  Skin:    General: Skin is warm and dry.  Neurological:     Mental Status: She is alert. She is disoriented.  Psychiatric:        Mood and Affect: Mood normal.     Labs reviewed: Recent Labs    02/13/23 0000 09/07/23 0800 09/09/23 1522 09/11/23 0000  NA 139 142 135 135*  K 4.1 3.9 3.5 3.8  CL 101 104 97* 98*  CO2 29 30 27  30*  GLUCOSE 139* 159* 323*  --   BUN 13 17 14 19   CREATININE 0.84 0.80 0.91 0.9  CALCIUM 9.3 9.1 9.0 9.1   Recent Labs    09/07/23 0800 09/09/23 1522  AST 11 41  ALT 11 24  ALKPHOS  --  63  BILITOT 0.5 0.4  PROT 6.2 7.4  ALBUMIN  --  4.2   Recent Labs    02/13/23 0000 09/07/23 0800 09/09/23 1522 09/11/23 0000  WBC 7.6 6.9 9.1 10.4  NEUTROABS 4,894 4,982 7.0 8,299.00  HGB 11.9 11.5* 12.3 11.8*  HCT 36.0 35.2 36.6 36  MCV 88.0 89.1 88.2  --   PLT 229 203 247 211   Lab Results  Component Value Date   TSH 2.930 04/04/2023   Lab Results  Component Value Date   HGBA1C 8.2 (H) 09/07/2023   Lab Results  Component Value Date   CHOL 167 02/13/2023   HDL 43 (L) 02/13/2023   LDLCALC 88 02/13/2023   LDLDIRECT 104.0 10/29/2021   TRIG 302 (H) 02/13/2023   CHOLHDL 3.9 02/13/2023    Significant Diagnostic Results in last 30 days:  No results found.  Assessment/Plan Diabetes Mellitus Elevated blood sugars (240s-270s) despite Januvia 100mg  and Jardiance  25mg  daily. Glipizide 4 mg was previously discontinued due to hypoglycemia. Patient has lost weight and has decreased appetite.  Metformin intolerance noted.  -Add glipizide at reduce dose 1mg  daily. -Check blood sugars twice daily to monitor for hypoglycemia and notify provider as needed. -Encourage routine oral intake and continue supplements.  Weight  loss Recently started on Remeron, and continues on supplements Will continue to monitor weights   Ople Girgis K. Caro BODILY [facility] [phone]

## 2023-11-09 DIAGNOSIS — F03B18 Unspecified dementia, moderate, with other behavioral disturbance: Secondary | ICD-10-CM | POA: Diagnosis not present

## 2023-11-09 DIAGNOSIS — D329 Benign neoplasm of meninges, unspecified: Secondary | ICD-10-CM | POA: Diagnosis not present

## 2023-11-09 DIAGNOSIS — R41841 Cognitive communication deficit: Secondary | ICD-10-CM | POA: Diagnosis not present

## 2023-11-21 ENCOUNTER — Non-Acute Institutional Stay (SKILLED_NURSING_FACILITY): Payer: Self-pay | Admitting: Nurse Practitioner

## 2023-11-21 ENCOUNTER — Encounter: Payer: Self-pay | Admitting: Nurse Practitioner

## 2023-11-21 DIAGNOSIS — Z7901 Long term (current) use of anticoagulants: Secondary | ICD-10-CM

## 2023-11-21 DIAGNOSIS — R634 Abnormal weight loss: Secondary | ICD-10-CM

## 2023-11-21 DIAGNOSIS — E1142 Type 2 diabetes mellitus with diabetic polyneuropathy: Secondary | ICD-10-CM

## 2023-11-21 DIAGNOSIS — F03B0 Unspecified dementia, moderate, without behavioral disturbance, psychotic disturbance, mood disturbance, and anxiety: Secondary | ICD-10-CM

## 2023-11-21 DIAGNOSIS — I10 Essential (primary) hypertension: Secondary | ICD-10-CM | POA: Diagnosis not present

## 2023-11-21 DIAGNOSIS — I480 Paroxysmal atrial fibrillation: Secondary | ICD-10-CM

## 2023-11-21 NOTE — Progress Notes (Signed)
 Location:  Other [facility].  Nursing Home Room Number: [facility] [address] Place of Service:  SNF [phone]) [name], NP  PCP: [name], MD  Patient Care Team: [name], MD as PCP - General (Family Medicine) [name] [name], MD as PCP - Cardiology (Cardiology) [name], [facility] (Inactive) as Pharmacist (Pharmacist) [name], MD as Consulting Physician (Ophthalmology)  Extended Emergency Contact Information Primary Emergency Contact: Angela,Bentley Address: [phone]          [place] [address] [place] of [place] Home Phone: [phone] Relation: Spouse Secondary Emergency Contact: Bentley,Angela Mobile Phone: [phone] Relation: Daughter Preferred language: English  Goals of care: Advanced Directive information    11/02/2023    9:23 AM  Advanced Directives  Does Patient Have a Medical Advance Directive? Yes  Type of Advance Directive Living will  Does patient want to make changes to medical advance directive? No - Patient declined     Chief Complaint  Patient presents with   Medical Management of Chronic Issues    Medical Management of Chronic Issues.     HPI:  Pt is a 83 y.o. female seen today for medical management of chronic disease. She was recently seen for an acute visit due to hyperglycemia. Her CBGs from 11/02/23 - 11/21/23 range from 158-350. Her last A1C was 8.2 in December 2024, and prior to that result it was 8.1 in May 2024.    Patient engaged in pleasant conversation today after lunch. She mentions she is from [place], [place]. She worked in Biomedical engineer. She has 3 children, two of which live in [place].  She mentions she cannot fully recall but that she recently came here to the rehab facility from independent living due to a fall.  She states she feels fine, no acute complaints. She mentions she has had diabetes for a long time and has never really felt her sugar level to be low.   She mentions she was  9-44 years of age when she became diabetic. She likes sugary foods and dessert.   She is in a wheelchair to get around and  remembers that she fell. She understands that she is not to walk on her own without assistance.   She denies any pain. Bowel movements are at baseline, she mentions she always had constipation.   Bladder function at baseline, wears a brief, and recently has had some nocturnal incontinence.   Denies any issues with chewing, or appetite changes.    Past Medical History:  Diagnosis Date   Actinic keratosis 06/25/2020   L pretibia distal - bx proven    Balance problems    BCC (basal cell carcinoma of skin) 12/17/2020   right temple, Moh's 01/19/2021   Brain tumor (HCC)    a. Left intraventricular tumor ->stable by 06/2017 MRI. Followed @ [facility].   Dysplastic nevus 06/25/2020   R flank - moderate   Gait disturbance    a. Unsteady on feet/balance difficulty.   Generalized osteoarthritis of multiple sites    GERD (gastroesophageal reflux disease)    History of DVT (deep vein thrombosis) 2016   estrogen and airflights. Rx with xarelto for 3 months   History of stress test    a. 11/2016 ETT: No ST/T changes.  Performed to assess PAC/PVC burden with activity ->No ectopy noted.   Hypertension    Hyperthyroidism    treated briefly in college   Insomnia    Obesity    PAF (paroxysmal atrial  fibrillation) (HCC)    a. 12/2016 Event Monitor: brief run of PAF-->CHA2DS2VASc = 5--> Xarelto.   Plantar fasciitis    Retinal tear of left eye    Rosacea    SCC (squamous cell carcinoma) 08/12/2021   left chest treated with ED& C 04/06/22   Squamous cell carcinoma of skin 09/07/2017   R dorsum of hand    Type 2 diabetes, controlled, with peripheral neuropathy (HCC)    a. 11/2016 A1c 7.4.   Uterine fibroid    Past Surgical History:  Procedure Laterality Date   CARPAL TUNNEL RELEASE Right    Dr Mina Marble   CATARACT EXTRACTION     Gamma knife  07/2020   intraventricular  mass---meningioma or choroid plexus papilloma   RETINAL TEAR REPAIR CRYOTHERAPY     10/09/13, then again 3/15    Allergies  Allergen Reactions   Penicillins     Has patient had a PCN reaction causing immediate rash, facial/tongue/throat swelling, SOB or lightheadedness with hypotension: Yes Has patient had a PCN reaction causing severe rash involving mucus membranes or skin necrosis: Yes Has patient had a PCN reaction that required hospitalization No Has patient had a PCN reaction occurring within the last 10 years: No If all of the above answers are "NO", then may proceed with Cephalosporin use.     Sulfa Antibiotics     Rash/itching   Azithromycin Palpitations    Outpatient Encounter Medications as of 11/21/2023  Medication Sig   empagliflozin (JARDIANCE) 25 MG TABS tablet Take 1 tablet (25 mg total) by mouth daily before breakfast.   glimepiride (AMARYL) 1 MG tablet Take 1 mg by mouth daily with breakfast.   hydrocortisone (ANUSOL-HC) 2.5 % rectal cream Place 1 Application rectally every 6 (six) hours as needed for hemorrhoids or anal itching.   losartan-hydrochlorothiazide (HYZAAR) 100-12.5 MG tablet Take 1 tablet by mouth daily.   metoprolol succinate (TOPROL-XL) 25 MG 24 hr tablet Take 1 tablet (25 mg total) by mouth daily.   mirtazapine (REMERON) 7.5 MG tablet Take 7.5 mg by mouth at bedtime.   Multiple Vitamin (MULTIVITAMIN) tablet Take 1 tablet by mouth daily.   polyethylene glycol (MIRALAX / GLYCOLAX) 17 g packet Take 17 g by mouth 2 (two) times daily as needed.   rivaroxaban (XARELTO) 20 MG TABS tablet Take 1 tablet (20 mg total) by mouth daily with supper.   sitaGLIPtin (JANUVIA) 100 MG tablet Take 100 mg by mouth daily.   traZODone (DESYREL) 50 MG tablet Take 50 mg by mouth at bedtime.   ondansetron (ZOFRAN) 4 MG tablet Take 4 mg by mouth every 8 (eight) hours as needed for nausea or vomiting. (Patient not taking: Reported on 11/21/2023)   sertraline (ZOLOFT) 50 MG tablet  Take 50 mg by mouth at bedtime. (Patient not taking: Reported on 11/21/2023)   No facility-administered encounter medications on file as of 11/21/2023.    Review of Systems  Unable to perform ROS: Dementia     Immunization History  Administered Date(s) Administered   Influenza, High Dose Seasonal PF 07/09/2014, 07/23/2015, 06/02/2016, 07/02/2019, 07/13/2022   Influenza,inj,Quad PF,6+ Mos 06/23/2017, 07/17/2018   Influenza-Unspecified 07/23/2015, 06/22/2020, 07/08/2021, 07/20/2023   Moderna Covid-19 Fall Seasonal Vaccine 34yrs & older 01/12/2023   Moderna Covid-19 Vaccine Bivalent Booster 51yrs & up 02/22/2022   Moderna Sars-Covid-2 Vaccination 10/08/2019, 11/05/2019, 07/24/2020, 02/28/2021, 08/05/2022   Pfizer Covid-19 Vaccine Bivalent Booster 29yrs & up 06/17/2021   Pneumococcal Conjugate-13 01/30/2014   Pneumococcal Polysaccharide-23 09/13/2004, 11/03/2016   RSV,unspecified 09/13/2023  Tdap 10/23/2012, 02/08/2023   Unspecified SARS-COV-2 Vaccination 06/23/2023   Zoster Recombinant(Shingrix) 11/19/2018, 04/11/2019   Zoster, Live 10/19/2006   Pertinent  Health Maintenance Due  Topic Date Due   FOOT EXAM  02/06/2024   OPHTHALMOLOGY EXAM  03/06/2024   HEMOGLOBIN A1C  03/07/2024   INFLUENZA VACCINE  Completed   DEXA SCAN  Completed   MAMMOGRAM  Discontinued      10/09/2019    8:51 AM 10/14/2020   10:47 AM 10/29/2021    9:41 AM 08/03/2022    1:10 PM 02/28/2023    9:50 AM  Fall Risk  Falls in the past year? 0 0 0 1 0  Was there an injury with Fall?    0 0  Fall Risk Category Calculator    2 0  Fall Risk Category (Retired)    Moderate   (RETIRED) Patient Fall Risk Level    Moderate fall risk   Patient at Risk for Falls Due to    History of fall(s)   Fall risk Follow up    Falls evaluation completed    Functional Status Survey:    Vitals:   11/21/23 1119  BP: 118/64  Pulse: 76  Resp: 18  Temp: 97.6 F (36.4 C)  SpO2: 93%  Weight: 188 lb 6.4 oz (85.5 kg)  Height: 5\' 6"   (1.676 m)   Body mass index is 30.41 kg/m. Physical Exam Vitals reviewed.  Constitutional:      Appearance: Normal appearance.  HENT:     Head: Normocephalic and atraumatic.     Right Ear: External ear normal.     Left Ear: External ear normal.     Nose: Nose normal.     Mouth/Throat:     Mouth: Mucous membranes are moist.     Pharynx: Oropharynx is clear.  Eyes:     Conjunctiva/sclera: Conjunctivae normal.  Cardiovascular:     Rate and Rhythm: Normal rate and regular rhythm.     Pulses: Normal pulses.     Heart sounds: Normal heart sounds.  Pulmonary:     Effort: Pulmonary effort is normal.     Breath sounds: Normal breath sounds.  Abdominal:     General: Bowel sounds are normal.     Palpations: Abdomen is soft.  Musculoskeletal:        General: Normal range of motion.     Cervical back: Neck supple.  Skin:    General: Skin is warm and dry.     Capillary Refill: Capillary refill takes less than 2 seconds.     Comments: Generalized dry skin that is peeling.  Neurological:     General: No focal deficit present.     Mental Status: She is alert. Mental status is at baseline.  Psychiatric:        Mood and Affect: Mood normal.        Behavior: Behavior normal.     Labs reviewed: Recent Labs    02/13/23 0000 09/07/23 0800 09/09/23 1522 09/11/23 0000  NA 139 142 135 135*  K 4.1 3.9 3.5 3.8  CL 101 104 97* 98*  CO2 29 30 27  30*  GLUCOSE 139* 159* 323*  --   BUN 13 17 14 19   CREATININE 0.84 0.80 0.91 0.9  CALCIUM 9.3 9.1 9.0 9.1   Recent Labs    09/07/23 0800 09/09/23 1522  AST 11 41  ALT 11 24  ALKPHOS  --  63  BILITOT 0.5 0.4  PROT 6.2 7.4  ALBUMIN  --  4.2   Recent Labs    02/13/23 0000 09/07/23 0800 09/09/23 1522 09/11/23 0000  WBC 7.6 6.9 9.1 10.4  NEUTROABS 4,894 4,982 7.0 8,299.00  HGB 11.9 11.5* 12.3 11.8*  HCT 36.0 35.2 36.6 36  MCV 88.0 89.1 88.2  --   PLT 229 203 247 211   Lab Results  Component Value Date   TSH 2.930 04/04/2023    Lab Results  Component Value Date   HGBA1C 8.2 (H) 09/07/2023   Lab Results  Component Value Date   CHOL 167 02/13/2023   HDL 43 (L) 02/13/2023   LDLCALC 88 02/13/2023   LDLDIRECT 104.0 10/29/2021   TRIG 302 (H) 02/13/2023   CHOLHDL 3.9 02/13/2023    Significant Diagnostic Results in last 30 days:  No results found.  Assessment/Plan  1. Type 2 diabetes, controlled, with peripheral neuropathy (HCC) (Primary) - Her blood glucose levels are still not at goal, ranging from 158-350. - Last A1C 8.2 - No low blood sugar levels noted per nursing staff - She is eating well for most of her meals - Increase glimepiride to 2 mg tablet by mouth daily with breakfast - Continue Jardiance 25 mg tablet by mouth daily before breakfast - Continue Januvia 100 mg tablet by mouth daily -continue routine cbgs check  2. PAF (paroxysmal atrial fibrillation) (HCC) - Stable, continue Toprol-XL 25 mg tablet by mouth daily - EKG from 09/11/23 showed normal sinus rhythm  - Continue Xarelto 20 mg tablet by mouth daily with supper for chronic anticoagulation  3. Moderate dementia without behavioral disturbance, psychotic disturbance, mood disturbance, or anxiety, unspecified dementia type (HCC) - At baseline, no behavioral changes noted by nursing staff - Patient continues to be active and involved in activities at facility  4. Primary hypertension - BP 118/64 today - Stable, continue Toprol-XL 25 mg tablet by mouth daily - Continue Hyzaar 100-12.5 mg tablet, 1 tablet by mouth daily  - CMP from 09/11/23 showed NA 135,   5. Chronic anticoagulation - Continue Xarelto 20 mg tablet by mouth daily with supper for chronic anticoagulation - CBC from 09/11/23 showed Hgb 11.8   6. Weight loss - Stable, weight today 188 lb. - Per nursing staff, she is eating well for most meals - No hypoglycemia noted - Continue Remeron 7.5 mg tablet at bedtime   Lenord Fellers, DNP - AGPCNP Student [facility] -I  personally was present during the history, physical exam and medical decision-making activities of this service and have verified that the service and findings are accurately documented in the student's note Anusha Claus K. [facility] [phone]

## 2023-11-21 NOTE — Progress Notes (Deleted)
 Location:  Other [facility].  Nursing Home Room Number: [facility] [address] Place of Service:  SNF [phone]) Angela Chatters, NP  PCP: [name], MD  Patient Care Team: [name], MD as PCP - General (Family Medicine) [name] [name], MD as PCP - Cardiology (Cardiology) [name], [facility] (Inactive) as Pharmacist (Pharmacist) [name], MD as Consulting Physician (Ophthalmology)  Extended Emergency Contact Information Primary Emergency Contact: Kimberlin,Fred Address: [phone]          [place] [address] [place] of [place] Home Phone: [phone] Relation: Spouse Secondary Emergency Contact: Jones,Nancy Mobile Phone: [phone] Relation: Daughter Preferred language: English  Goals of care: Advanced Directive information    11/02/2023    9:23 AM  Advanced Directives  Does Patient Have a Medical Advance Directive? Yes  Type of Advance Directive Living will  Does patient want to make changes to medical advance directive? No - Patient declined     Chief Complaint  Patient presents with   Medical Management of Chronic Issues    Medical Management of Chronic Issues.     HPI:  Pt is a 83 y.o. female seen today for medical management of chronic disease.    Past Medical History:  Diagnosis Date   Actinic keratosis 06/25/2020   L pretibia distal - bx proven    Balance problems    BCC (basal cell carcinoma of skin) 12/17/2020   right temple, Moh's 01/19/2021   Brain tumor (HCC)    a. Left intraventricular tumor ->stable by 06/2017 MRI. Followed @ [facility].   Dysplastic nevus 06/25/2020   R flank - moderate   Gait disturbance    a. Unsteady on feet/balance difficulty.   Generalized osteoarthritis of multiple sites    GERD (gastroesophageal reflux disease)    History of DVT (deep vein thrombosis) 2016   estrogen and airflights. Rx with xarelto for 3 months   History of stress test    a. 11/2016 ETT: No ST/T changes.  Performed to assess  PAC/PVC burden with activity ->No ectopy noted.   Hypertension    Hyperthyroidism    treated briefly in college   Insomnia    Obesity    PAF (paroxysmal atrial fibrillation) (HCC)    a. 12/2016 Event Monitor: brief run of PAF-->CHA2DS2VASc = 5--> Xarelto.   Plantar fasciitis    Retinal tear of left eye    Rosacea    SCC (squamous cell carcinoma) 08/12/2021   left chest treated with ED& C 04/06/22   Squamous cell carcinoma of skin 09/07/2017   R dorsum of hand    Type 2 diabetes, controlled, with peripheral neuropathy (HCC)    a. 11/2016 A1c 7.4.   Uterine fibroid    Past Surgical History:  Procedure Laterality Date   CARPAL TUNNEL RELEASE Right    Dr Mina Marble   CATARACT EXTRACTION     Gamma knife  07/2020   intraventricular mass---meningioma or choroid plexus papilloma   RETINAL TEAR REPAIR CRYOTHERAPY     10/09/13, then again 3/15    Allergies  Allergen Reactions   Penicillins     Has patient had a PCN reaction causing immediate rash, facial/tongue/throat swelling, SOB or lightheadedness with hypotension: Yes Has patient had a PCN reaction causing severe rash involving mucus membranes or skin necrosis: Yes Has patient had a PCN reaction that required hospitalization No Has patient had a PCN reaction occurring within the last 10 years: No If all of the above answers are "NO", then may proceed with  Cephalosporin use.     Sulfa Antibiotics     Rash/itching   Azithromycin Palpitations    Outpatient Encounter Medications as of 11/21/2023  Medication Sig   empagliflozin (JARDIANCE) 25 MG TABS tablet Take 1 tablet (25 mg total) by mouth daily before breakfast.   glimepiride (AMARYL) 1 MG tablet Take 1 mg by mouth daily with breakfast.   hydrocortisone (ANUSOL-HC) 2.5 % rectal cream Place 1 Application rectally every 6 (six) hours as needed for hemorrhoids or anal itching.   losartan-hydrochlorothiazide (HYZAAR) 100-12.5 MG tablet Take 1 tablet by mouth daily.   metoprolol  succinate (TOPROL-XL) 25 MG 24 hr tablet Take 1 tablet (25 mg total) by mouth daily.   mirtazapine (REMERON) 7.5 MG tablet Take 7.5 mg by mouth at bedtime.   Multiple Vitamin (MULTIVITAMIN) tablet Take 1 tablet by mouth daily.   polyethylene glycol (MIRALAX / GLYCOLAX) 17 g packet Take 17 g by mouth 2 (two) times daily as needed.   rivaroxaban (XARELTO) 20 MG TABS tablet Take 1 tablet (20 mg total) by mouth daily with supper.   sitaGLIPtin (JANUVIA) 100 MG tablet Take 100 mg by mouth daily.   traZODone (DESYREL) 50 MG tablet Take 50 mg by mouth at bedtime.   ondansetron (ZOFRAN) 4 MG tablet Take 4 mg by mouth every 8 (eight) hours as needed for nausea or vomiting. (Patient not taking: Reported on 11/21/2023)   sertraline (ZOLOFT) 50 MG tablet Take 50 mg by mouth at bedtime. (Patient not taking: Reported on 11/21/2023)   No facility-administered encounter medications on file as of 11/21/2023.    Review of Systems ***  Immunization History  Administered Date(s) Administered   Influenza, High Dose Seasonal PF 07/09/2014, 07/23/2015, 06/02/2016, 07/02/2019, 07/13/2022   Influenza,inj,Quad PF,6+ Mos 06/23/2017, 07/17/2018   Influenza-Unspecified 07/23/2015, 06/22/2020, 07/08/2021, 07/20/2023   Moderna Covid-19 Fall Seasonal Vaccine 50yrs & older 01/12/2023   Moderna Covid-19 Vaccine Bivalent Booster 90yrs & up 02/22/2022   Moderna Sars-Covid-2 Vaccination 10/08/2019, 11/05/2019, 07/24/2020, 02/28/2021, 08/05/2022   Pfizer Covid-19 Vaccine Bivalent Booster 25yrs & up 06/17/2021   Pneumococcal Conjugate-13 01/30/2014   Pneumococcal Polysaccharide-23 09/13/2004, 11/03/2016   RSV,unspecified 09/13/2023   Tdap 10/23/2012, 02/08/2023   Unspecified SARS-COV-2 Vaccination 06/23/2023   Zoster Recombinant(Shingrix) 11/19/2018, 04/11/2019   Zoster, Live 10/19/2006   Pertinent  Health Maintenance Due  Topic Date Due   FOOT EXAM  02/06/2024   OPHTHALMOLOGY EXAM  03/06/2024   HEMOGLOBIN A1C  03/07/2024    INFLUENZA VACCINE  Completed   DEXA SCAN  Completed   MAMMOGRAM  Discontinued      10/09/2019    8:51 AM 10/14/2020   10:47 AM 10/29/2021    9:41 AM 08/03/2022    1:10 PM 02/28/2023    9:50 AM  Fall Risk  Falls in the past year? 0 0 0 1 0  Was there an injury with Fall?    0 0  Fall Risk Category Calculator    2 0  Fall Risk Category (Retired)    Moderate   (RETIRED) Patient Fall Risk Level    Moderate fall risk   Patient at Risk for Falls Due to    History of fall(s)   Fall risk Follow up    Falls evaluation completed    Functional Status Survey:    Vitals:   11/21/23 1119  BP: 118/64  Pulse: 76  Resp: 18  Temp: 97.6 F (36.4 C)  SpO2: 93%  Weight: 188 lb 6.4 oz (85.5 kg)  Height: 5'  6" (1.676 m)   Body mass index is 30.41 kg/m. Physical Exam***  Labs reviewed: Recent Labs    02/13/23 0000 09/07/23 0800 09/09/23 1522 09/11/23 0000  NA 139 142 135 135*  K 4.1 3.9 3.5 3.8  CL 101 104 97* 98*  CO2 29 30 27  30*  GLUCOSE 139* 159* 323*  --   BUN 13 17 14 19   CREATININE 0.84 0.80 0.91 0.9  CALCIUM 9.3 9.1 9.0 9.1   Recent Labs    09/07/23 0800 09/09/23 1522  AST 11 41  ALT 11 24  ALKPHOS  --  63  BILITOT 0.5 0.4  PROT 6.2 7.4  ALBUMIN  --  4.2   Recent Labs    02/13/23 0000 09/07/23 0800 09/09/23 1522 09/11/23 0000  WBC 7.6 6.9 9.1 10.4  NEUTROABS 4,894 4,982 7.0 8,299.00  HGB 11.9 11.5* 12.3 11.8*  HCT 36.0 35.2 36.6 36  MCV 88.0 89.1 88.2  --   PLT 229 203 247 211   Lab Results  Component Value Date   TSH 2.930 04/04/2023   Lab Results  Component Value Date   HGBA1C 8.2 (H) 09/07/2023   Lab Results  Component Value Date   CHOL 167 02/13/2023   HDL 43 (L) 02/13/2023   LDLCALC 88 02/13/2023   LDLDIRECT 104.0 10/29/2021   TRIG 302 (H) 02/13/2023   CHOLHDL 3.9 02/13/2023    Significant Diagnostic Results in last 30 days:  No results found.  Assessment/Plan No problem-specific Assessment & Plan notes found for this  encounter.     Janene Harvey. [facility] [phone]

## 2023-11-22 DIAGNOSIS — F03B4 Unspecified dementia, moderate, with anxiety: Secondary | ICD-10-CM | POA: Diagnosis not present

## 2023-11-22 DIAGNOSIS — F03B18 Unspecified dementia, moderate, with other behavioral disturbance: Secondary | ICD-10-CM | POA: Diagnosis not present

## 2023-11-22 DIAGNOSIS — F03B3 Unspecified dementia, moderate, with mood disturbance: Secondary | ICD-10-CM | POA: Diagnosis not present

## 2023-11-22 DIAGNOSIS — J069 Acute upper respiratory infection, unspecified: Secondary | ICD-10-CM | POA: Diagnosis not present

## 2023-11-22 DIAGNOSIS — F5105 Insomnia due to other mental disorder: Secondary | ICD-10-CM | POA: Diagnosis not present

## 2023-11-22 DIAGNOSIS — F03B11 Unspecified dementia, moderate, with agitation: Secondary | ICD-10-CM | POA: Diagnosis not present

## 2023-12-13 ENCOUNTER — Non-Acute Institutional Stay (SKILLED_NURSING_FACILITY): Payer: Self-pay | Admitting: Student

## 2023-12-13 ENCOUNTER — Encounter: Payer: Self-pay | Admitting: Student

## 2023-12-13 DIAGNOSIS — E0590 Thyrotoxicosis, unspecified without thyrotoxic crisis or storm: Secondary | ICD-10-CM

## 2023-12-13 DIAGNOSIS — I480 Paroxysmal atrial fibrillation: Secondary | ICD-10-CM | POA: Diagnosis not present

## 2023-12-13 DIAGNOSIS — I10 Essential (primary) hypertension: Secondary | ICD-10-CM

## 2023-12-13 DIAGNOSIS — E1142 Type 2 diabetes mellitus with diabetic polyneuropathy: Secondary | ICD-10-CM

## 2023-12-13 DIAGNOSIS — F03B0 Unspecified dementia, moderate, without behavioral disturbance, psychotic disturbance, mood disturbance, and anxiety: Secondary | ICD-10-CM

## 2023-12-13 NOTE — Progress Notes (Unsigned)
 Location:  Other [facility].  Nursing Home Room Number: [facility] [address] Place of Service:  SNF [phone]) Provider:  [name], MD  Patient Care Team: [name], MD as PCP - General (Family Medicine) Antonieta Iba, MD as PCP - Cardiology (Cardiology) [name], [facility] (Inactive) as Pharmacist (Pharmacist) [name], MD as Consulting Physician (Ophthalmology)  Extended Emergency Contact Information Primary Emergency Contact: Moncada,Fred Address: [phone]          [place] [address] [place] Home Phone: [phone] Relation: Spouse Secondary Emergency Contact: Jones,Nancy Mobile Phone: [phone] Relation: Daughter Preferred language: English  Code Status:  Full Code Goals of care: Advanced Directive information    11/02/2023    9:23 AM  Advanced Directives  Does Patient Have a Medical Advance Directive? Yes  Type of Advance Directive Living will  Does patient want to make changes to medical advance directive? No - Patient declined     Chief Complaint  Patient presents with   Medical Management of Chronic Issues    Medical Management of Chronic Issues.     HPI:  Pt is a 83 y.o. female seen today for medical management of chronic diseases.    Patient has had ~30 lb weight loss in the last 3 months. She has had poor food intake. Contacted spouse and HCPOA to discuss given concern for her ability to fight infection. He was surprised she has had that much weight loss. Medically, she has had additional protein supplementation which sh eoften refuses as well as depression medication which has orexigenic effects which has not improved her food intake. Encouraged family to be present during meals to help. Spouse states he thought he should step back because she wants to leave with him. Encouraged his continued visitation and support as she will likely have continued decline. He states if she is pulseless he would want her to remain  comfortable. Additionally, based on previous hospitalizations, he does not want her to spend time going back and forth to the hospital if she is sick. Would prefer to make sure she remains comfortable in that case.   Patient states she wishes she could leave. She is unable to give date or location, but she does mention the art in her room was produced by her husbands first wife who is deceased.    Past Medical History:  Diagnosis Date   Actinic keratosis 06/25/2020   L pretibia distal - bx proven    Balance problems    BCC (basal cell carcinoma of skin) 12/17/2020   right temple, Moh's 01/19/2021   Brain tumor (HCC)    a. Left intraventricular tumor ->stable by 06/2017 MRI. Followed @ [facility].   Dysplastic nevus 06/25/2020   R flank - moderate   Gait disturbance    a. Unsteady on feet/balance difficulty.   Generalized osteoarthritis of multiple sites    GERD (gastroesophageal reflux disease)    History of DVT (deep vein thrombosis) 2016   estrogen and airflights. Rx with xarelto for 3 months   History of stress test    a. 11/2016 ETT: No ST/T changes.  Performed to assess PAC/PVC burden with activity ->No ectopy noted.   Hypertension    Hyperthyroidism    treated briefly in college   Insomnia    Obesity    PAF (paroxysmal atrial fibrillation) (HCC)    a. 12/2016 Event Monitor: brief run of PAF-->CHA2DS2VASc = 5--> Xarelto.   Plantar fasciitis    Retinal tear of left  eye    Rosacea    SCC (squamous cell carcinoma) 08/12/2021   left chest treated with ED& C 04/06/22   Squamous cell carcinoma of skin 09/07/2017   R dorsum of hand    Type 2 diabetes, controlled, with peripheral neuropathy (HCC)    a. 11/2016 A1c 7.4.   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     Sulfa Antibiotics     Rash/itching   Azithromycin Palpitations    Outpatient Encounter Medications as of 12/13/2023  Medication Sig   empagliflozin (JARDIANCE) 25 MG TABS tablet Take 1 tablet (25 mg total) by mouth daily before breakfast.   glimepiride (AMARYL) 1 MG tablet Take 2 mg by mouth daily with breakfast.   hydrocortisone (ANUSOL-HC) 2.5 % rectal cream Place 1 Application rectally every 6 (six) hours as needed for hemorrhoids or anal itching.   losartan-hydrochlorothiazide (HYZAAR) 100-12.5 MG tablet Take 1 tablet by mouth daily.   metoprolol succinate (TOPROL-XL) 25 MG 24 hr tablet Take 1 tablet (25 mg total) by mouth daily.   mirtazapine (REMERON) 7.5 MG tablet Take 7.5 mg by mouth at bedtime.   Multiple Vitamin (MULTIVITAMIN) tablet Take 1 tablet by mouth daily.   polyethylene glycol (MIRALAX / GLYCOLAX) 17 g packet Take 17 g by mouth daily.   rivaroxaban (XARELTO) 20 MG TABS tablet Take 1 tablet (20 mg total) by mouth daily with supper.   sitaGLIPtin (JANUVIA) 100 MG tablet Take 100 mg by mouth daily.   traZODone (DESYREL) 50 MG tablet Take 50 mg by mouth at bedtime.   ondansetron (ZOFRAN) 4 MG tablet Take 4 mg by mouth every 8 (eight) hours as needed for nausea or vomiting. (Patient not taking: Reported on 11/02/2023)   sertraline (ZOLOFT) 50 MG tablet Take 50 mg by mouth at bedtime. (Patient not taking: Reported on 12/13/2023)   No facility-administered  encounter medications on file as of 12/13/2023.    Review of Systems  Immunization History  Administered Date(s) Administered   Influenza, High Dose Seasonal PF 07/09/2014, 07/23/2015, 06/02/2016, 07/02/2019, 07/13/2022   Influenza,inj,Quad PF,6+ Mos 06/23/2017, 07/17/2018   Influenza-Unspecified 07/23/2015, 06/22/2020, 07/08/2021, 07/20/2023   Moderna Covid-19 Fall Seasonal Vaccine 33yrs & older 01/12/2023   Moderna Covid-19 Vaccine Bivalent Booster 34yrs & up 02/22/2022   Moderna Sars-Covid-2 Vaccination 10/08/2019, 11/05/2019, 07/24/2020, 02/28/2021, 08/05/2022   Pfizer Covid-19 Vaccine Bivalent Booster 34yrs & up 06/17/2021   Pneumococcal Conjugate-13 01/30/2014   Pneumococcal Polysaccharide-23 09/13/2004, 11/03/2016   RSV,unspecified 09/13/2023   Tdap 10/23/2012, 02/08/2023   Unspecified SARS-COV-2 Vaccination 06/23/2023   Zoster Recombinant(Shingrix) 11/19/2018, 04/11/2019   Zoster, Live 10/19/2006   Pertinent  Health Maintenance Due  Topic Date Due   FOOT EXAM  02/06/2024  OPHTHALMOLOGY EXAM  03/06/2024   HEMOGLOBIN A1C  03/07/2024   INFLUENZA VACCINE  Completed   DEXA SCAN  Completed   MAMMOGRAM  Discontinued      10/09/2019    8:51 AM 10/14/2020   10:47 AM 10/29/2021    9:41 AM 08/03/2022    1:10 PM 02/28/2023    9:50 AM  Fall Risk  Falls in the past year? 0 0 0 1 0  Was there an injury with Fall?    0 0  Fall Risk Category Calculator    2 0  Fall Risk Category (Retired)    Moderate   (RETIRED) Patient Fall Risk Level    Moderate fall risk   Patient at Risk for Falls Due to    History of fall(s)   Fall risk Follow up    Falls evaluation completed    Functional Status Survey:    Vitals:   12/13/23 1113  BP: 130/73  Pulse: 65  Resp: 18  Temp: 98.3 F (36.8 C)  SpO2: 95%  Weight: 181 lb (82.1 kg)  Height: 5\' 6"  (1.676 m)   Body mass index is 29.21 kg/m. Physical Exam Constitutional:      Appearance: Normal appearance.  Cardiovascular:     Rate and  Rhythm: Normal rate and regular rhythm.     Pulses: Normal pulses.  Pulmonary:     Effort: Pulmonary effort is normal.  Abdominal:     General: Abdomen is flat.     Palpations: Abdomen is soft.  Skin:    General: Skin is warm.  Neurological:     Mental Status: She is alert. Mental status is at baseline. She is disoriented.     Labs reviewed: Recent Labs    02/13/23 0000 09/07/23 0800 09/09/23 1522 09/11/23 0000  NA 139 142 135 135*  K 4.1 3.9 3.5 3.8  CL 101 104 97* 98*  CO2 29 30 27  30*  GLUCOSE 139* 159* 323*  --   BUN 13 17 14 19   CREATININE 0.84 0.80 0.91 0.9  CALCIUM 9.3 9.1 9.0 9.1   Recent Labs    09/07/23 0800 09/09/23 1522  AST 11 41  ALT 11 24  ALKPHOS  --  63  BILITOT 0.5 0.4  PROT 6.2 7.4  ALBUMIN  --  4.2   Recent Labs    02/13/23 0000 09/07/23 0800 09/09/23 1522 09/11/23 0000  WBC 7.6 6.9 9.1 10.4  NEUTROABS 4,894 4,982 7.0 8,299.00  HGB 11.9 11.5* 12.3 11.8*  HCT 36.0 35.2 36.6 36  MCV 88.0 89.1 88.2  --   PLT 229 203 247 211   Lab Results  Component Value Date   TSH 2.930 04/04/2023   Lab Results  Component Value Date   HGBA1C 8.2 (H) 09/07/2023   Lab Results  Component Value Date   CHOL 167 02/13/2023   HDL 43 (L) 02/13/2023   LDLCALC 88 02/13/2023   LDLDIRECT 104.0 10/29/2021   TRIG 302 (H) 02/13/2023   CHOLHDL 3.9 02/13/2023    Significant Diagnostic Results in last 30 days:  No results found.  Assessment/Plan PAF (paroxysmal atrial fibrillation) (HCC) - Plan: Do not attempt resuscitation (DNR)  Type 2 diabetes, controlled, with peripheral neuropathy (HCC) - Plan: Do not attempt resuscitation (DNR)  Moderate dementia without behavioral disturbance, psychotic disturbance, mood disturbance, or anxiety, unspecified dementia type (HCC) - Plan: Do not attempt resuscitation (DNR)  Primary hypertension - Plan: Do not attempt resuscitation (DNR)  Hyperthyroidism - Plan: Do not attempt  resuscitation (DNR) Patient with a  history of PAF. Rate controlled. No signs of bleeding at this time. Will collect labs for DM at this time. Hx of dementia which has progressed significantly in the last 6 months. She is dependent in all ADLS and has transitioned to long term care. At this time will initiate DNR. Will prevent future hospitalization at this time, unless she has an illness that must be addressed in the hospital such as a serious fracture or hemorrhage. BP well-controlled. Continue levothyroxine. Annual TSH.   Family/ staff Communication: nursing, spouse  Labs/tests ordered:  CBC, CMP, A1c  I spent greater than 30  minutes for the care of this patient in face to face time, chart review, clinical documentation, patient education. I spent an additional 16 minutes discussing goals of care and advanced care planning.

## 2023-12-14 ENCOUNTER — Encounter: Payer: Self-pay | Admitting: Student

## 2023-12-14 DIAGNOSIS — H524 Presbyopia: Secondary | ICD-10-CM | POA: Diagnosis not present

## 2023-12-14 DIAGNOSIS — R4182 Altered mental status, unspecified: Secondary | ICD-10-CM | POA: Diagnosis not present

## 2023-12-14 DIAGNOSIS — Z961 Presence of intraocular lens: Secondary | ICD-10-CM | POA: Diagnosis not present

## 2023-12-14 DIAGNOSIS — E113291 Type 2 diabetes mellitus with mild nonproliferative diabetic retinopathy without macular edema, right eye: Secondary | ICD-10-CM | POA: Diagnosis not present

## 2023-12-14 DIAGNOSIS — E119 Type 2 diabetes mellitus without complications: Secondary | ICD-10-CM | POA: Diagnosis not present

## 2023-12-14 DIAGNOSIS — E1165 Type 2 diabetes mellitus with hyperglycemia: Secondary | ICD-10-CM | POA: Diagnosis not present

## 2023-12-14 LAB — HEPATIC FUNCTION PANEL
ALT: 37 U/L — AB (ref 7–35)
AST: 22 (ref 13–35)
Alkaline Phosphatase: 76 (ref 25–125)
Bilirubin, Total: 0.3

## 2023-12-14 LAB — COMPREHENSIVE METABOLIC PANEL WITH GFR
Albumin: 3.4 — AB (ref 3.5–5.0)
Calcium: 8.9 (ref 8.7–10.7)
Globulin: 2.3
eGFR: 52

## 2023-12-14 LAB — BASIC METABOLIC PANEL WITH GFR
BUN: 18 (ref 4–21)
CO2: 32 — AB (ref 13–22)
Chloride: 101 (ref 99–108)
Creatinine: 1.1 (ref 0.5–1.1)
Glucose: 116
Potassium: 3.8 meq/L (ref 3.5–5.1)
Sodium: 142 (ref 137–147)

## 2023-12-14 LAB — CBC AND DIFFERENTIAL
HCT: 33 — AB (ref 36–46)
Hemoglobin: 11.2 — AB (ref 12.0–16.0)
Neutrophils Absolute: 5213
Platelets: 231 10*3/uL (ref 150–400)
WBC: 7.7

## 2023-12-14 LAB — HEMOGLOBIN A1C: Hemoglobin A1C: 8.6

## 2023-12-14 LAB — CBC: RBC: 3.82 — AB (ref 3.87–5.11)

## 2023-12-20 DIAGNOSIS — F03B4 Unspecified dementia, moderate, with anxiety: Secondary | ICD-10-CM | POA: Diagnosis not present

## 2023-12-20 DIAGNOSIS — F5105 Insomnia due to other mental disorder: Secondary | ICD-10-CM | POA: Diagnosis not present

## 2023-12-20 DIAGNOSIS — F03B18 Unspecified dementia, moderate, with other behavioral disturbance: Secondary | ICD-10-CM | POA: Diagnosis not present

## 2023-12-20 DIAGNOSIS — F03B11 Unspecified dementia, moderate, with agitation: Secondary | ICD-10-CM | POA: Diagnosis not present

## 2023-12-20 DIAGNOSIS — F03B3 Unspecified dementia, moderate, with mood disturbance: Secondary | ICD-10-CM | POA: Diagnosis not present

## 2023-12-29 DIAGNOSIS — M6281 Muscle weakness (generalized): Secondary | ICD-10-CM | POA: Diagnosis not present

## 2024-01-01 DIAGNOSIS — B351 Tinea unguium: Secondary | ICD-10-CM | POA: Diagnosis not present

## 2024-01-01 DIAGNOSIS — E1159 Type 2 diabetes mellitus with other circulatory complications: Secondary | ICD-10-CM | POA: Diagnosis not present

## 2024-01-03 DIAGNOSIS — M6281 Muscle weakness (generalized): Secondary | ICD-10-CM | POA: Diagnosis not present

## 2024-01-05 DIAGNOSIS — Z23 Encounter for immunization: Secondary | ICD-10-CM | POA: Diagnosis not present

## 2024-01-10 DIAGNOSIS — M6281 Muscle weakness (generalized): Secondary | ICD-10-CM | POA: Diagnosis not present

## 2024-01-17 DIAGNOSIS — F03B11 Unspecified dementia, moderate, with agitation: Secondary | ICD-10-CM | POA: Diagnosis not present

## 2024-01-17 DIAGNOSIS — F5105 Insomnia due to other mental disorder: Secondary | ICD-10-CM | POA: Diagnosis not present

## 2024-01-17 DIAGNOSIS — F03B4 Unspecified dementia, moderate, with anxiety: Secondary | ICD-10-CM | POA: Diagnosis not present

## 2024-01-17 DIAGNOSIS — F03B3 Unspecified dementia, moderate, with mood disturbance: Secondary | ICD-10-CM | POA: Diagnosis not present

## 2024-01-17 DIAGNOSIS — F03B18 Unspecified dementia, moderate, with other behavioral disturbance: Secondary | ICD-10-CM | POA: Diagnosis not present

## 2024-01-26 DIAGNOSIS — M6281 Muscle weakness (generalized): Secondary | ICD-10-CM | POA: Diagnosis not present

## 2024-01-29 DIAGNOSIS — I110 Hypertensive heart disease with heart failure: Secondary | ICD-10-CM | POA: Diagnosis not present

## 2024-01-29 DIAGNOSIS — E538 Deficiency of other specified B group vitamins: Secondary | ICD-10-CM | POA: Diagnosis not present

## 2024-01-29 DIAGNOSIS — E785 Hyperlipidemia, unspecified: Secondary | ICD-10-CM | POA: Diagnosis not present

## 2024-01-29 LAB — CBC WITH DIFFERENTIAL/PLATELET
Absolute Lymphocytes: 1157 {cells}/uL (ref 850–3900)
Absolute Monocytes: 525 {cells}/uL (ref 200–950)
Basophils Absolute: 27 {cells}/uL (ref 0–200)
Basophils Relative: 0.3 %
Eosinophils Absolute: 89 {cells}/uL (ref 15–500)
Eosinophils Relative: 1 %
HCT: 33.5 % — ABNORMAL LOW (ref 35.0–45.0)
Hemoglobin: 11.1 g/dL — ABNORMAL LOW (ref 11.7–15.5)
MCH: 28.8 pg (ref 27.0–33.0)
MCHC: 33.1 g/dL (ref 32.0–36.0)
MCV: 86.8 fL (ref 80.0–100.0)
MPV: 11 fL (ref 7.5–12.5)
Monocytes Relative: 5.9 %
Neutro Abs: 7102 {cells}/uL (ref 1500–7800)
Neutrophils Relative %: 79.8 %
Platelets: 222 10*3/uL (ref 140–400)
RBC: 3.86 10*6/uL (ref 3.80–5.10)
RDW: 13.8 % (ref 11.0–15.0)
Total Lymphocyte: 13 %
WBC: 8.9 10*3/uL (ref 3.8–10.8)

## 2024-01-29 LAB — BASIC METABOLIC PANEL WITH GFR
BUN: 12 mg/dL (ref 7–25)
CO2: 29 mmol/L (ref 20–32)
Calcium: 8.8 mg/dL (ref 8.6–10.4)
Chloride: 105 mmol/L (ref 98–110)
Creat: 0.79 mg/dL (ref 0.60–0.95)
Glucose, Bld: 113 mg/dL — ABNORMAL HIGH (ref 65–99)
Potassium: 3.8 mmol/L (ref 3.5–5.3)
Sodium: 143 mmol/L (ref 135–146)
eGFR: 74 mL/min/{1.73_m2} (ref >=60)

## 2024-02-08 ENCOUNTER — Non-Acute Institutional Stay (SKILLED_NURSING_FACILITY): Payer: Self-pay | Admitting: Nurse Practitioner

## 2024-02-08 ENCOUNTER — Encounter: Payer: Self-pay | Admitting: Nurse Practitioner

## 2024-02-08 DIAGNOSIS — E1142 Type 2 diabetes mellitus with diabetic polyneuropathy: Secondary | ICD-10-CM

## 2024-02-08 DIAGNOSIS — R634 Abnormal weight loss: Secondary | ICD-10-CM | POA: Insufficient documentation

## 2024-02-08 DIAGNOSIS — I10 Essential (primary) hypertension: Secondary | ICD-10-CM | POA: Diagnosis not present

## 2024-02-08 DIAGNOSIS — I480 Paroxysmal atrial fibrillation: Secondary | ICD-10-CM

## 2024-02-08 DIAGNOSIS — F03B4 Unspecified dementia, moderate, with anxiety: Secondary | ICD-10-CM

## 2024-02-08 DIAGNOSIS — E440 Moderate protein-calorie malnutrition: Secondary | ICD-10-CM | POA: Diagnosis not present

## 2024-02-08 NOTE — Assessment & Plan Note (Signed)
 Continues to have weight loss despite medication for appetite and supplements. Continue to provide support with meals.

## 2024-02-08 NOTE — Assessment & Plan Note (Signed)
 Blood sugars are better controlled at this time, will continue current regimen. No hypoglycemia noted.

## 2024-02-08 NOTE — Progress Notes (Signed)
 Location:  Other [facility].  Nursing Home Room Number: [facility] [address] Place of Service:  SNF [phone]) Gilbert Lab, NP  PCP: Valrie Gehrig, MD  Patient Care Team: Valrie Gehrig, MD as PCP - General (Family Medicine) [name] [name], MD as PCP - Cardiology (Cardiology) Alta Ast, [facility] (Inactive) as Pharmacist (Pharmacist) Rudine Cos, MD as Consulting Physician (Ophthalmology)  Extended Emergency Contact Information Primary Emergency Contact: Bentley,Angela Address: [address]          [place] [address] [place] Home Phone: [phone] Relation: Spouse Secondary Emergency Contact: Bentley,Angela Mobile Phone: [phone] Relation: Daughter Preferred language: English  Goals of care: Advanced Directive information    02/08/2024   10:54 AM  Advanced Directives  Does Patient Have a Medical Advance Directive? Yes  Type of Advance Directive Living will;Out of facility DNR (pink MOST or yellow form)  Does patient want to make changes to medical advance directive? No - Patient declined     Chief Complaint  Patient presents with   Medical Management of Chronic Issues    Medical Management of Chronic Issues.     HPI:  Pt is a 83 y.o. female seen today for medical management of chronic disease. Pt with hx of dementia with progressive decline. Has had ongoing weight loss noted.  She has increase in paranoia in the evening but sleepy during the day so Depakote was decreased to daily.  No increase in behaviors noted.  Pt denies pain.     Past Medical History:  Diagnosis Date   Actinic keratosis 06/25/2020   L pretibia distal - bx proven    Balance problems    BCC (basal cell carcinoma of skin) 12/17/2020   right temple, Moh's 01/19/2021   Brain tumor (HCC)    a. Left intraventricular tumor ->stable by 06/2017 MRI. Followed @ [facility].   Dysplastic nevus 06/25/2020   R flank - moderate   Gait disturbance    a. Unsteady on feet/balance  difficulty.   Generalized osteoarthritis of multiple sites    GERD (gastroesophageal reflux disease)    History of DVT (deep vein thrombosis) 2016   estrogen and airflights. Rx with xarelto  for 3 months   History of stress test    a. 11/2016 ETT: No ST/T changes.  Performed to assess PAC/PVC burden with activity ->No ectopy noted.   Hypertension    Hyperthyroidism    treated briefly in college   Insomnia    Obesity    PAF (paroxysmal atrial fibrillation) (HCC)    a. 12/2016 Event Monitor: brief run of PAF-->CHA2DS2VASc = 5--> Xarelto .   Plantar fasciitis    Retinal tear of left eye    Rosacea    SCC (squamous cell carcinoma) 08/12/2021   left chest treated with ED& C 04/06/22   Squamous cell carcinoma of skin 09/07/2017   R dorsum of hand    Type 2 diabetes, controlled, with peripheral neuropathy (HCC)    a. 11/2016 A1c 7.4.   Uterine fibroid    Past Surgical History:  Procedure Laterality Date   CARPAL TUNNEL RELEASE Right    Dr Donzella Galley   CATARACT EXTRACTION     Gamma knife  07/2020   intraventricular mass---meningioma or choroid plexus papilloma   RETINAL TEAR REPAIR CRYOTHERAPY     10/09/13, then again 3/15    Allergies  Allergen Reactions   Penicillins     Has patient had a PCN reaction causing immediate rash, facial/tongue/throat swelling, SOB or lightheadedness with hypotension:  Yes Has patient had a PCN reaction causing severe rash involving mucus membranes or skin necrosis: Yes Has patient had a PCN reaction that required hospitalization No Has patient had a PCN reaction occurring within the last 10 years: No If all of the above answers are "NO", then may proceed with Cephalosporin use.     Sulfa Antibiotics     Rash/itching   Azithromycin Palpitations    Outpatient Encounter Medications as of 02/08/2024  Medication Sig   apixaban (ELIQUIS) 5 MG TABS tablet Take 5 mg by mouth 2 (two) times daily.   divalproex (DEPAKOTE) 125 MG DR tablet Take 125 mg by mouth  daily.   empagliflozin  (JARDIANCE ) 25 MG TABS tablet Take 1 tablet (25 mg total) by mouth daily before breakfast.   glimepiride  (AMARYL ) 1 MG tablet Take 2 mg by mouth daily with breakfast.   hydrocortisone (ANUSOL-HC) 2.5 % rectal cream Place 1 Application rectally every 6 (six) hours as needed for hemorrhoids or anal itching.   losartan  (COZAAR ) 100 MG tablet Take 100 mg by mouth daily.   metoprolol  succinate (TOPROL -XL) 25 MG 24 hr tablet Take 1 tablet (25 mg total) by mouth daily.   mirtazapine (REMERON) 15 MG tablet Take 15 mg by mouth at bedtime.   Multiple Vitamin (MULTIVITAMIN) tablet Take 1 tablet by mouth daily.   polyethylene glycol (MIRALAX / GLYCOLAX) 17 g packet Take 17 g by mouth daily.   sitaGLIPtin (JANUVIA) 100 MG tablet Take 100 mg by mouth daily.   traZODone  (DESYREL ) 50 MG tablet Take 50 mg by mouth at bedtime.   [DISCONTINUED] losartan -hydrochlorothiazide  (HYZAAR) 100-12.5 MG tablet Take 1 tablet by mouth daily. (Patient not taking: Reported on 02/08/2024)   [DISCONTINUED] mirtazapine (REMERON) 7.5 MG tablet Take 7.5 mg by mouth at bedtime.   [DISCONTINUED] rivaroxaban  (XARELTO ) 20 MG TABS tablet Take 1 tablet (20 mg total) by mouth daily with supper. (Patient not taking: Reported on 02/08/2024)   No facility-administered encounter medications on file as of 02/08/2024.    Review of Systems  Unable to perform ROS: Dementia     Immunization History  Administered Date(s) Administered   Influenza, High Dose Seasonal PF 07/09/2014, 07/23/2015, 06/02/2016, 07/02/2019, 07/13/2022   Influenza,inj,Quad PF,6+ Mos 06/23/2017, 07/17/2018   Influenza-Unspecified 07/23/2015, 06/22/2020, 07/08/2021, 07/20/2023   Moderna Covid-19 Fall Seasonal Vaccine 58yrs & older 01/12/2023   Moderna Covid-19 Vaccine  Bivalent Booster 14yrs & up 02/22/2022   Moderna Sars-Covid-2 Vaccination 10/08/2019, 11/05/2019, 07/24/2020, 02/28/2021, 08/05/2022   Pfizer Covid-19 Vaccine Bivalent Booster 73yrs &  up 06/17/2021   Pneumococcal Conjugate-13 01/30/2014   Pneumococcal Polysaccharide-23 09/13/2004, 11/03/2016   RSV,unspecified 09/13/2023   Tdap 10/23/2012, 02/08/2023   Unspecified SARS-COV-2 Vaccination 06/23/2023   Zoster Recombinant(Shingrix) 11/19/2018, 04/11/2019   Zoster, Live 10/19/2006   Pertinent  Health Maintenance Due  Topic Date Due   FOOT EXAM  02/06/2024   OPHTHALMOLOGY EXAM  03/06/2024   INFLUENZA VACCINE  04/26/2024   HEMOGLOBIN A1C  06/15/2024   DEXA SCAN  Completed   MAMMOGRAM  Discontinued      10/09/2019    8:51 AM 10/14/2020   10:47 AM 10/29/2021    9:41 AM 08/03/2022    1:10 PM 02/28/2023    9:50 AM  Fall Risk  Falls in the past year? 0 0 0 1 0  Was there an injury with Fall?    0 0  Fall Risk Category Calculator    2 0  Fall Risk Category (Retired)    Moderate   (RETIRED)  Patient Fall Risk Level    Moderate fall risk   Patient at Risk for Falls Due to    History of fall(s)   Fall risk Follow up    Falls evaluation completed    Functional Status Survey:    Vitals:   02/08/24 1027  BP: 128/66  Pulse: 87  Resp: 20  Temp: (!) 95.4 F (35.2 C)  SpO2: 94%  Weight: 178 lb 9.6 oz (81 kg)  Height: 5\' 6"  (1.676 m)   Body mass index is 28.83 kg/m. Physical Exam Constitutional:      General: She is not in acute distress.    Appearance: She is well-developed. She is not diaphoretic.  HENT:     Head: Normocephalic and atraumatic.     Mouth/Throat:     Pharynx: No oropharyngeal exudate.  Eyes:     Conjunctiva/sclera: Conjunctivae normal.     Pupils: Pupils are equal, round, and reactive to light.  Cardiovascular:     Rate and Rhythm: Normal rate and regular rhythm.     Heart sounds: Normal heart sounds.  Pulmonary:     Effort: Pulmonary effort is normal.     Breath sounds: Normal breath sounds.  Abdominal:     General: Bowel sounds are normal.     Palpations: Abdomen is soft.  Musculoskeletal:     Cervical back: Normal range of motion and neck  supple.     Right lower leg: No edema.     Left lower leg: No edema.  Skin:    General: Skin is warm and dry.  Neurological:     Mental Status: She is alert.  Psychiatric:        Mood and Affect: Mood normal.     Labs reviewed: Recent Labs    09/07/23 0800 09/09/23 1522 09/11/23 0000 12/14/23 0000 01/29/24 0635  NA 142 135 135* 142 143  K 3.9 3.5 3.8 3.8 3.8  CL 104 97* 98* 101 105  CO2 30 27 30* 32* 29  GLUCOSE 159* 323*  --   --  113*  BUN 17 14 19 18 12   CREATININE 0.80 0.91 0.9 1.1 0.79  CALCIUM 9.1 9.0 9.1 8.9 8.8   Recent Labs    09/07/23 0800 09/09/23 1522 12/14/23 0000  AST 11 41 22  ALT 11 24 37*  ALKPHOS  --  63 76  BILITOT 0.5 0.4  --   PROT 6.2 7.4  --   ALBUMIN  --  4.2 3.4*   Recent Labs    09/07/23 0800 09/09/23 1522 09/11/23 0000 12/14/23 0000 01/29/24 0635  WBC 6.9 9.1 10.4 7.7 8.9  NEUTROABS 4,982 7.0 8,299.00 5,213.00 7,102  HGB 11.5* 12.3 11.8* 11.2* 11.1*  HCT 35.2 36.6 36 33* 33.5*  MCV 89.1 88.2  --   --  86.8  PLT 203 247 211 231 222   Lab Results  Component Value Date   TSH 2.930 04/04/2023   Lab Results  Component Value Date   HGBA1C 8.6 12/14/2023   Lab Results  Component Value Date   CHOL 167 02/13/2023   HDL 43 (L) 02/13/2023   LDLCALC 88 02/13/2023   LDLDIRECT 104.0 10/29/2021   TRIG 302 (H) 02/13/2023   CHOLHDL 3.9 02/13/2023    Significant Diagnostic Results in last 30 days:  No results found.  Assessment/Plan Hypertension Blood pressure well controlled, goal bp <140/90 Continue current medications and dietary modifications follow metabolic panel  Moderate dementia with anxiety (HCC) Progressive decline, continues with supportive  from staff and family Recent decrease in Depakote due to increase in fatigue and more sleeping.   Moderate protein-calorie malnutrition (HCC) Continues to have weight loss despite medication for appetite and supplements. Continue to provide support with meals. Will continue  to follow.   PAF (paroxysmal atrial fibrillation) (HCC) Rate controlled  Type 2 diabetes, controlled, with peripheral neuropathy (HCC) Blood sugars are better controlled at this time, will continue current regimen. No hypoglycemia noted.      Angela Bentley Fisherman [facility] [phone]

## 2024-02-08 NOTE — Assessment & Plan Note (Signed)
 Rate controlled

## 2024-02-08 NOTE — Assessment & Plan Note (Signed)
 Blood pressure well controlled, goal bp <140/90 Continue current medications and dietary modifications follow metabolic panel

## 2024-02-08 NOTE — Assessment & Plan Note (Addendum)
 Progressive decline, continues with supportive from staff and family Recent decrease in Depakote due to increase in fatigue and more sleeping.

## 2024-02-14 DIAGNOSIS — F419 Anxiety disorder, unspecified: Secondary | ICD-10-CM | POA: Diagnosis not present

## 2024-02-14 DIAGNOSIS — F03B18 Unspecified dementia, moderate, with other behavioral disturbance: Secondary | ICD-10-CM | POA: Diagnosis not present

## 2024-02-14 DIAGNOSIS — F03B11 Unspecified dementia, moderate, with agitation: Secondary | ICD-10-CM | POA: Diagnosis not present

## 2024-02-14 DIAGNOSIS — G4700 Insomnia, unspecified: Secondary | ICD-10-CM | POA: Diagnosis not present

## 2024-02-14 DIAGNOSIS — F03B3 Unspecified dementia, moderate, with mood disturbance: Secondary | ICD-10-CM | POA: Diagnosis not present

## 2024-02-15 DIAGNOSIS — Z79899 Other long term (current) drug therapy: Secondary | ICD-10-CM | POA: Diagnosis not present

## 2024-02-15 LAB — BASIC METABOLIC PANEL WITH GFR
BUN: 12 (ref 4–21)
CO2: 28 — AB (ref 13–22)
Chloride: 105 (ref 99–108)
Creatinine: 0.7 (ref 0.5–1.1)
Glucose: 97
Potassium: 3.6 meq/L (ref 3.5–5.1)
Sodium: 142 (ref 137–147)

## 2024-02-15 LAB — CBC AND DIFFERENTIAL
HCT: 34 — AB (ref 36–46)
Hemoglobin: 11.1 — AB (ref 12.0–16.0)
Neutrophils Absolute: 6207
Platelets: 206 10*3/uL (ref 150–400)
WBC: 8.2

## 2024-02-15 LAB — HEPATIC FUNCTION PANEL
ALT: 20 U/L (ref 7–35)
AST: 17 (ref 13–35)
Alkaline Phosphatase: 79 (ref 25–125)
Bilirubin, Total: 0.4

## 2024-02-15 LAB — COMPREHENSIVE METABOLIC PANEL WITH GFR
Albumin: 3.6 (ref 3.5–5.0)
Calcium: 8.7 (ref 8.7–10.7)
Globulin: 2.2
eGFR: 86

## 2024-02-15 LAB — CBC: RBC: 3.84 — AB (ref 3.87–5.11)

## 2024-03-05 ENCOUNTER — Encounter: Payer: BLUE CROSS/BLUE SHIELD | Admitting: Nurse Practitioner

## 2024-03-13 DIAGNOSIS — B351 Tinea unguium: Secondary | ICD-10-CM | POA: Diagnosis not present

## 2024-03-13 DIAGNOSIS — E1159 Type 2 diabetes mellitus with other circulatory complications: Secondary | ICD-10-CM | POA: Diagnosis not present

## 2024-03-14 DIAGNOSIS — G4700 Insomnia, unspecified: Secondary | ICD-10-CM | POA: Diagnosis not present

## 2024-03-14 DIAGNOSIS — F419 Anxiety disorder, unspecified: Secondary | ICD-10-CM | POA: Diagnosis not present

## 2024-03-14 DIAGNOSIS — F03C3 Unspecified dementia, severe, with mood disturbance: Secondary | ICD-10-CM | POA: Diagnosis not present

## 2024-03-14 DIAGNOSIS — F03C4 Unspecified dementia, severe, with anxiety: Secondary | ICD-10-CM | POA: Diagnosis not present

## 2024-03-18 DIAGNOSIS — D649 Anemia, unspecified: Secondary | ICD-10-CM | POA: Diagnosis not present

## 2024-03-19 ENCOUNTER — Encounter: Payer: Self-pay | Admitting: Nurse Practitioner

## 2024-03-19 ENCOUNTER — Non-Acute Institutional Stay (SKILLED_NURSING_FACILITY): Payer: Self-pay | Admitting: Nurse Practitioner

## 2024-03-19 DIAGNOSIS — Z0000 Encounter for general adult medical examination without abnormal findings: Secondary | ICD-10-CM

## 2024-03-19 LAB — CBC AND DIFFERENTIAL
HCT: 35 — AB (ref 36–46)
Hemoglobin: 11.3 — AB (ref 12.0–16.0)
Neutrophils Absolute: 7285
Platelets: 226 10*3/uL (ref 150–400)
WBC: 9.4

## 2024-03-19 LAB — CBC: RBC: 4 (ref 3.87–5.11)

## 2024-03-19 LAB — IRON,TIBC AND FERRITIN PANEL: Iron: 47

## 2024-03-19 NOTE — Progress Notes (Signed)
 Subjective:   Angela Bentley is a 83 y.o. female who presents for Medicare Annual (Subsequent) preventive examination.  Visit Complete: In person [facility]          Objective:    Today's Vitals   03/19/24 1444  BP: 132/79  Pulse: 81  Resp: 20  Temp: (!) 95.4 F (35.2 C)  SpO2: 94%  Weight: 174 lb (78.9 kg)  Height: 5' 6 (1.676 m)   Body mass index is 28.08 kg/m.     03/19/2024    2:49 PM 02/08/2024   10:54 AM 11/02/2023    9:23 AM 10/20/2023    1:46 PM 10/13/2023   10:07 AM 09/29/2023   11:21 AM 09/06/2023    1:50 PM  Advanced Directives  Does Patient Have a Medical Advance Directive? Yes Yes Yes Yes Yes Yes Yes  Type of Advance Directive Living will;Out of facility DNR (pink MOST or yellow form) Living will;Out of facility DNR (pink MOST or yellow form) Living will Living will Living will Living will Living will  Does patient want to make changes to medical advance directive? No - Patient declined No - Patient declined No - Patient declined No - Patient declined No - Patient declined No - Patient declined No - Patient declined    Current Medications (verified) Outpatient Encounter Medications as of 03/19/2024  Medication Sig   apixaban (ELIQUIS) 5 MG TABS tablet Take 5 mg by mouth 2 (two) times daily.   divalproex (DEPAKOTE) 125 MG DR tablet Take 125 mg by mouth daily.   empagliflozin  (JARDIANCE ) 25 MG TABS tablet Take 1 tablet (25 mg total) by mouth daily before breakfast.   ferrous sulfate 325 (65 FE) MG tablet Take 325 mg by mouth every Monday, Wednesday, and Friday.   glimepiride  (AMARYL ) 2 MG tablet Take 2 mg by mouth daily.   hydrocortisone (ANUSOL-HC) 2.5 % rectal cream Place 1 Application rectally every 6 (six) hours as needed for hemorrhoids or anal itching.   losartan  (COZAAR ) 100 MG tablet Take 100 mg by mouth daily.   metoprolol  succinate (TOPROL -XL) 25 MG 24 hr tablet Take 1 tablet (25 mg total) by mouth daily.   mirtazapine (REMERON) 15 MG tablet Take 15 mg  by mouth at bedtime.   Multiple Vitamin (MULTIVITAMIN) tablet Take 1 tablet by mouth daily.   polyethylene glycol (MIRALAX / GLYCOLAX) 17 g packet Take 17 g by mouth daily.   sitaGLIPtin (JANUVIA) 100 MG tablet Take 100 mg by mouth daily.   traZODone  (DESYREL ) 50 MG tablet Take 50 mg by mouth at bedtime.   glimepiride  (AMARYL ) 1 MG tablet Take 2 mg by mouth daily with breakfast. (Patient not taking: Reported on 03/19/2024)   No facility-administered encounter medications on file as of 03/19/2024.    Allergies (verified) Penicillins, Sulfa antibiotics, and Azithromycin   History: Past Medical History:  Diagnosis Date   Actinic keratosis 06/25/2020   L pretibia distal - bx proven    Balance problems    BCC (basal cell carcinoma of skin) 12/17/2020   right temple, Moh's 01/19/2021   Brain tumor (HCC)    a. Left intraventricular tumor ->stable by 06/2017 MRI. Followed @ [facility].   Dysplastic nevus 06/25/2020   R flank - moderate   Gait disturbance    a. Unsteady on feet/balance difficulty.   Generalized osteoarthritis of multiple sites    GERD (gastroesophageal reflux disease)    History of DVT (deep vein thrombosis) 2016   estrogen and airflights. Rx with xarelto   for 3 months   History of stress test    a. 11/2016 ETT: No ST/T changes.  Performed to assess PAC/PVC burden with activity ->No ectopy noted.   Hypertension    Hyperthyroidism    treated briefly in college   Insomnia    Obesity    PAF (paroxysmal atrial fibrillation) (HCC)    a. 12/2016 Event Monitor: brief run of PAF-->CHA2DS2VASc = 5--> Xarelto .   Plantar fasciitis    Retinal tear of left eye    Rosacea    SCC (squamous cell carcinoma) 08/12/2021   left chest treated with ED& C 04/06/22   Squamous cell carcinoma of skin 09/07/2017   R dorsum of hand    Type 2 diabetes, controlled, with peripheral neuropathy (HCC)    a. 11/2016 A1c 7.4.   Uterine fibroid    Past Surgical History:  Procedure Laterality Date   CARPAL  TUNNEL RELEASE Right    Dr Sissy   CATARACT EXTRACTION     Gamma knife  07/2020   intraventricular mass---meningioma or choroid plexus papilloma   RETINAL TEAR REPAIR CRYOTHERAPY     10/09/13, then again 3/15   Family History  Problem Relation Age of Onset   Diabetes Father    Pancreatic cancer Father    Diabetes Other        grandmother   Social History   Socioeconomic History   Marital status: Married    Spouse name: Not on file   Number of children: 3   Years of education: Not on file   Highest education level: Not on file  Occupational History   Occupation: Non profit management    Comment: [employer] and others  Tobacco Use   Smoking status: Former    Current packs/day: 0.00    Types: Cigarettes    Quit date: 09/26/1993    Years since quitting: 30.4   Smokeless tobacco: Never   Tobacco comments:    quit in 1996  Vaping Use   Vaping status: Never Used  Substance and Sexual Activity   Alcohol use: Yes    Alcohol/week: 1.0 standard drink of alcohol    Types: 1 Glasses of wine per week    Comment: occasionally   Drug use: No   Sexual activity: Not Currently    Partners: Male  Other Topics Concern   Not on file  Social History Narrative   Has living will   Husband is health care POA-- or daughter Inocente   Would accept resuscitation attempts   Would not want tube feeds if cognitively unaware   Social Drivers of Health   Financial Resource Strain: Low Risk  (03/02/2021)   Overall Financial Resource Strain (CARDIA)    Difficulty of Paying Living Expenses: Not very hard  Food Insecurity: Not on file  Transportation Needs: Not on file  Physical Activity: Not on file  Stress: Not on file  Social Connections: Unknown (02/04/2022)   [facility]   Social Network    Social Network: Not on file    Tobacco Counseling Counseling given: Not Answered Tobacco comments: quit in 1996   Clinical Intake:                        Activities of  Daily Living     No data to display          Patient Care Team: Abdul Fine, MD as PCP - General (Family Medicine) [name], MD as PCP -  Cardiology (Cardiology) Myra Rosaline FALCON, [facility] (Inactive) as Pharmacist (Pharmacist) [name], MD as Consulting Physician (Ophthalmology)  Indicate any recent Medical Services you may have received from other than [facility] providers in the past year (date may be approximate).     Assessment:   This is a routine wellness examination for Dynasti.  Hearing/Vision screen No results found.   Goals Addressed   None    Depression Screen    02/28/2023    9:50 AM 08/03/2022    1:10 PM 10/29/2021    9:42 AM 10/14/2020   10:47 AM 10/09/2019    8:51 AM 10/09/2019    8:03 AM 10/03/2018    9:10 AM  PHQ 2/9 Scores  PHQ - 2 Score 0 0 0 0 0 0 0    Fall Risk    02/28/2023    9:50 AM 08/03/2022    1:10 PM 10/29/2021    9:41 AM 10/14/2020   10:47 AM 10/09/2019    8:51 AM  Fall Risk   Falls in the past year? 0 1 0 0 0   Number falls in past yr: 0 1     Injury with Fall? 0 0     Risk for fall due to :  History of fall(s)     Follow up  Falls evaluation completed         Data saved with a previous flowsheet row definition    MEDICARE RISK AT HOME:    TIMED UP AND GO:  Was the test performed?  No    Cognitive Function:        02/28/2023    9:48 AM  6CIT Screen  What Year? 4 points  What month? 0 points  What time? 0 points  Count back from 20 2 points  Months in reverse 4 points  Repeat phrase 10 points  Total Score 20 points    Immunizations Immunization History  Administered Date(s) Administered   Influenza, High Dose Seasonal PF 07/09/2014, 07/23/2015, 06/02/2016, 07/02/2019, 07/13/2022   Influenza,inj,Quad PF,6+ Mos 06/23/2017, 07/17/2018   Influenza-Unspecified 07/23/2015, 06/22/2020, 07/08/2021, 07/20/2023   Moderna Covid-19 Fall Seasonal Vaccine 70yrs & older 01/12/2023, 01/05/2024   Moderna Covid-19 Vaccine   Bivalent Booster 26yrs & up 02/22/2022   Moderna Sars-Covid-2 Vaccination 10/08/2019, 11/05/2019, 07/24/2020, 02/28/2021, 08/05/2022   Pfizer Covid-19 Vaccine Bivalent Booster 22yrs & up 06/17/2021   Pneumococcal Conjugate-13 01/30/2014   Pneumococcal Polysaccharide-23 09/13/2004, 11/03/2016   RSV,unspecified 09/13/2023   Tdap 10/23/2012, 02/08/2023   Unspecified SARS-COV-2 Vaccination 06/23/2023   Zoster Recombinant(Shingrix) 11/19/2018, 04/11/2019   Zoster, Live 10/19/2006    TDAP status: Up to date  Flu Vaccine status: Up to date  Pneumococcal vaccine status: Up to date  Covid-19 vaccine status: Information provided on how to obtain vaccines.   Qualifies for Shingles Vaccine? Yes   Zostavax completed No   Shingrix Completed?: Yes  Screening Tests Health Maintenance  Topic Date Due   FOOT EXAM  02/06/2024   Diabetic kidney evaluation - Urine ACR  02/13/2024   Medicare Annual Wellness (AWV)  02/28/2024   COVID-19 Vaccine (11 - 2024-25 season) 03/01/2024   OPHTHALMOLOGY EXAM  03/06/2024   INFLUENZA VACCINE  04/26/2024   HEMOGLOBIN A1C  06/15/2024   Diabetic kidney evaluation - eGFR measurement  02/14/2025   DTaP/Tdap/Td (3 - Td or Tdap) 02/07/2033   Pneumococcal Vaccine: 50+ Years  Completed   DEXA SCAN  Completed   Zoster Vaccines- Shingrix  Completed   Hepatitis B Vaccines  Aged Out  HPV VACCINES  Aged Out   Meningococcal B Vaccine  Aged Out   MAMMOGRAM  Discontinued    Health Maintenance  Health Maintenance Due  Topic Date Due   FOOT EXAM  02/06/2024   Diabetic kidney evaluation - Urine ACR  02/13/2024   Medicare Annual Wellness (AWV)  02/28/2024   COVID-19 Vaccine (11 - 2024-25 season) 03/01/2024   OPHTHALMOLOGY EXAM  03/06/2024    Colorectal cancer screening: No longer required.   Mammogram status: No longer required due to age.   Lung Cancer Screening: (Low Dose CT Chest recommended if Age 85-80 years, 20 pack-year currently smoking OR have quit  w/in 15years.) does not qualify.   Additional Screening:  Hepatitis C Screening: does not qualify  Vision Screening: Recommended annual ophthalmology exams for early detection of glaucoma and other disorders of the eye. Is the patient up to date with their annual eye exam?  Yes  Who is the provider or what is the name of the office in which the patient attends annual eye exams? Onsite eye care If pt is not established with a provider, would they like to be referred to a provider to establish care? No .   Dental Screening: Recommended annual dental exams for proper oral hygiene  Diabetic Foot Exam: Diabetic Foot Exam: Completed 03/13/2024  Community Resource Referral / Chronic Care Management: CRR required this visit?  No   CCM required this visit?  No     Plan:     I have personally reviewed and noted the following in the patient's chart:   Medical and social history Use of alcohol, tobacco or illicit drugs  Current medications and supplements including opioid prescriptions. Patient is not currently taking opioid prescriptions. Functional ability and status Nutritional status Physical activity Advanced directives List of other physicians Hospitalizations, surgeries, and ER visits in previous 12 months Vitals Screenings to include cognitive, depression, and falls Referrals and appointments  In addition, I have reviewed and discussed with patient certain preventive protocols, quality metrics, and best practice recommendations. A written personalized care plan for preventive services as well as general preventive health recommendations were provided to patient.     Harlene MARLA An, NP   03/19/2024

## 2024-03-19 NOTE — Patient Instructions (Signed)
  Angela Bentley , Thank you for taking time to come for your Medicare Wellness Visit. I appreciate your ongoing commitment to your health goals. Please review the following plan we discussed and let me know if I can assist you in the future.      This is a list of the screening recommended for you and due dates:  Health Maintenance  Topic Date Due   Complete foot exam   02/06/2024   Yearly kidney health urinalysis for diabetes  02/13/2024   COVID-19 Vaccine (11 - 2024-25 season) 03/01/2024   Eye exam for diabetics  03/06/2024   Flu Shot  04/26/2024   Hemoglobin A1C  06/15/2024   Yearly kidney function blood test for diabetes  02/14/2025   Medicare Annual Wellness Visit  03/19/2025   DTaP/Tdap/Td vaccine (3 - Td or Tdap) 02/07/2033   Pneumococcal Vaccine for age over 32  Completed   DEXA scan (bone density measurement)  Completed   Zoster (Shingles) Vaccine  Completed   Hepatitis B Vaccine  Aged Out   HPV Vaccine  Aged Out   Meningitis B Vaccine  Aged Out   Mammogram  Discontinued

## 2024-04-22 ENCOUNTER — Non-Acute Institutional Stay (SKILLED_NURSING_FACILITY): Payer: Self-pay | Admitting: Student

## 2024-04-22 ENCOUNTER — Encounter: Payer: Self-pay | Admitting: Student

## 2024-04-22 DIAGNOSIS — I10 Essential (primary) hypertension: Secondary | ICD-10-CM

## 2024-04-22 DIAGNOSIS — E1142 Type 2 diabetes mellitus with diabetic polyneuropathy: Secondary | ICD-10-CM

## 2024-04-22 DIAGNOSIS — E0590 Thyrotoxicosis, unspecified without thyrotoxic crisis or storm: Secondary | ICD-10-CM | POA: Diagnosis not present

## 2024-04-22 DIAGNOSIS — F03B4 Unspecified dementia, moderate, with anxiety: Secondary | ICD-10-CM | POA: Diagnosis not present

## 2024-04-22 DIAGNOSIS — I700 Atherosclerosis of aorta: Secondary | ICD-10-CM

## 2024-04-22 DIAGNOSIS — I480 Paroxysmal atrial fibrillation: Secondary | ICD-10-CM | POA: Diagnosis not present

## 2024-04-22 NOTE — Progress Notes (Signed)
 Location:      Place of Service:    Provider:  Abdul Abdul Fine, MD  Patient Care Team: Abdul Fine, MD as PCP - General (Family Medicine) [name], MD as PCP - Cardiology (Cardiology) Myra Rosaline FALCON, [facility] (Inactive) as Pharmacist (Pharmacist) [name], MD as Consulting Physician (Ophthalmology)  Extended Emergency Contact Information Primary Emergency Contact: Drummer,Fred Address: [address]          [place] [address] [place] Home Phone: [phone] Relation: Spouse Secondary Emergency Contact: Jones,Nancy Mobile Phone: [phone] Relation: Daughter Preferred language: English  Code Status:  DNR Goals of care: Advanced Directive information    03/19/2024    2:49 PM  Advanced Directives  Does Patient Have a Medical Advance Directive? Yes  Type of Advance Directive Living will;Out of facility DNR (pink MOST or yellow form)  Does patient want to make changes to medical advance directive? No - Patient declined     No chief complaint on file.   HPI:  Pt is a 83 y.o. female seen today for a routine Discussed the use of AI scribe software for clinical note transcription with the patient, who gave verbal consent to proceed.  History of Present Illness    History of Present Illness The patient presents for a follow-up regarding her diabetes management and weight loss.  She has experienced weight loss since moving to the retirement facility, decreasing from 181 pounds to 174 pounds. She is currently on Amaryl  for diabetes management. Her last A1c in March was 8.6, and she is due for a repeat test.  She has a history of atrial fibrillation and is currently on Eliquis without signs of bleeding.  She has a history of dementia with behavioral disturbances and is on Depakote 125 mg. Additionally, she takes trazodone  50 mg and mirtazapine 15 mg as an appetite stimulant due to poor oral intake.  Her sleep is reported  as fine, and she does not experience any sleep problems.  Past Medical History - Type 2 diabetes mellitus with history of obesity - Chronic slight anemia - Atrial fibrillation - Dementia with behavioral disturbances    Medications - Eliquis - Depakote 125 mg - Trazodone  50 mg - Mirtazapine 15 mg - Amaryl   Social History - Partner Status: Married - Living Situation: Lives in a retirement community in [place], [place] , with her husband. 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    Past Medical History:  Diagnosis Date   Actinic keratosis 06/25/2020   L pretibia distal - bx proven    Balance problems    BCC (basal cell carcinoma of skin) 12/17/2020   right temple, Moh's 01/19/2021   Brain tumor (HCC)    a. Left intraventricular tumor ->stable by 06/2017 MRI. Followed @ [facility].   Dysplastic nevus 06/25/2020   R flank - moderate   Gait disturbance    a. Unsteady on feet/balance difficulty.   Generalized osteoarthritis of multiple sites    GERD (gastroesophageal reflux disease)    History of DVT (deep vein thrombosis) 2016   estrogen and airflights. Rx with xarelto  for 3 months   History of stress test    a. 11/2016 ETT: No ST/T changes.  Performed to assess PAC/PVC burden with activity ->No ectopy noted.   Hypertension    Hyperthyroidism    treated briefly in college   Insomnia    Obesity    PAF (paroxysmal atrial fibrillation) (HCC)    a. 12/2016 Event Monitor: brief run  of PAF-->CHA2DS2VASc = 5--> Xarelto .   Plantar fasciitis    Retinal tear of left eye    Rosacea    SCC (squamous cell carcinoma) 08/12/2021   left chest treated with ED& C 04/06/22   Squamous cell carcinoma of skin 09/07/2017   R dorsum of hand    Type 2 diabetes, controlled, with peripheral neuropathy (HCC)    a. 11/2016 A1c 7.4.   Uterine fibroid    Past Surgical History:  Procedure Laterality Date   CARPAL TUNNEL RELEASE Right    Dr Sissy   CATARACT EXTRACTION     Gamma  knife  07/2020   intraventricular mass---meningioma or choroid plexus papilloma   RETINAL TEAR REPAIR CRYOTHERAPY     10/09/13, then again 3/15    Allergies  Allergen Reactions   Penicillins     Has patient had a PCN reaction causing immediate rash, facial/tongue/throat swelling, SOB or lightheadedness with hypotension: Yes Has patient had a PCN reaction causing severe rash involving mucus membranes or skin necrosis: Yes Has patient had a PCN reaction that required hospitalization No Has patient had a PCN reaction occurring within the last 10 years: No If all of the above answers are NO, then may proceed with Cephalosporin use.     Sulfa Antibiotics     Rash/itching   Azithromycin Palpitations    Outpatient Encounter Medications as of 04/22/2024  Medication Sig   apixaban (ELIQUIS) 5 MG TABS tablet Take 5 mg by mouth 2 (two) times daily.   divalproex (DEPAKOTE) 125 MG DR tablet Take 125 mg by mouth daily.   empagliflozin  (JARDIANCE ) 25 MG TABS tablet Take 1 tablet (25 mg total) by mouth daily before breakfast.   ferrous sulfate 325 (65 FE) MG tablet Take 325 mg by mouth every Monday, Wednesday, and Friday.   glimepiride  (AMARYL ) 1 MG tablet Take 2 mg by mouth daily with breakfast. (Patient not taking: Reported on 03/19/2024)   glimepiride  (AMARYL ) 2 MG tablet Take 2 mg by mouth daily.   hydrocortisone (ANUSOL-HC) 2.5 % rectal cream Place 1 Application rectally every 6 (six) hours as needed for hemorrhoids or anal itching.   losartan  (COZAAR ) 100 MG tablet Take 100 mg by mouth daily.   metoprolol  succinate (TOPROL -XL) 25 MG 24 hr tablet Take 1 tablet (25 mg total) by mouth daily.   mirtazapine (REMERON) 15 MG tablet Take 15 mg by mouth at bedtime.   Multiple Vitamin (MULTIVITAMIN) tablet Take 1 tablet by mouth daily.   polyethylene glycol (MIRALAX / GLYCOLAX) 17 g packet Take 17 g by mouth daily.   sitaGLIPtin (JANUVIA) 100 MG tablet Take 100 mg by mouth daily.   traZODone  (DESYREL )  50 MG tablet Take 50 mg by mouth at bedtime.   No facility-administered encounter medications on file as of 04/22/2024.    Review of Systems  Immunization History  Administered Date(s) Administered   Influenza, High Dose Seasonal PF 07/09/2014, 07/23/2015, 06/02/2016, 07/02/2019, 07/13/2022   Influenza,inj,Quad PF,6+ Mos 06/23/2017, 07/17/2018   Influenza-Unspecified 07/23/2015, 06/22/2020, 07/08/2021, 07/20/2023   Moderna Covid-19 Fall Seasonal Vaccine 79yrs & older 01/12/2023, 01/05/2024   Moderna Covid-19 Vaccine  Bivalent Booster 51yrs & up 02/22/2022   Moderna Sars-Covid-2 Vaccination 10/08/2019, 11/05/2019, 07/24/2020, 02/28/2021, 08/05/2022   Pfizer Covid-19 Vaccine Bivalent Booster 63yrs & up 06/17/2021   Pneumococcal Conjugate-13 01/30/2014   Pneumococcal Polysaccharide-23 09/13/2004, 11/03/2016   RSV,unspecified 09/13/2023   Tdap 10/23/2012, 02/08/2023   Unspecified SARS-COV-2 Vaccination 06/23/2023   Zoster Recombinant(Shingrix) 11/19/2018, 04/11/2019   Zoster, Live 10/19/2006  Pertinent  Health Maintenance Due  Topic Date Due   OPHTHALMOLOGY EXAM  03/06/2024   INFLUENZA VACCINE  04/26/2024   HEMOGLOBIN A1C  06/15/2024   FOOT EXAM  03/19/2025   DEXA SCAN  Completed   MAMMOGRAM  Discontinued      10/14/2020   10:47 AM 10/29/2021    9:41 AM 08/03/2022    1:10 PM 02/28/2023    9:50 AM 03/19/2024    3:36 PM  Fall Risk  Falls in the past year? 0 0 1 0 1  Was there an injury with Fall?   0 0 0  Fall Risk Category Calculator   2 0 2  Fall Risk Category (Retired)   Moderate     (RETIRED) Patient Fall Risk Level   Moderate fall risk     Patient at Risk for Falls Due to   History of fall(s)  History of fall(s);Impaired balance/gait;Impaired mobility  Fall risk Follow up   Falls evaluation completed   Falls evaluation completed     Data saved with a previous flowsheet row definition   Functional Status Survey:    There were no vitals filed for this visit. There is no  height or weight on file to calculate BMI. Physical Exam Constitutional:      Appearance: Normal appearance.  Cardiovascular:     Rate and Rhythm: Normal rate and regular rhythm.     Pulses: Normal pulses.     Heart sounds: Normal heart sounds.  Pulmonary:     Effort: Pulmonary effort is normal.  Abdominal:     General: Abdomen is flat. Bowel sounds are normal.     Palpations: Abdomen is soft.  Musculoskeletal:        General: No swelling or tenderness.  Skin:    General: Skin is warm and dry.  Neurological:     Mental Status: She is alert. Mental status is at baseline. She is disoriented.     Gait: Gait abnormal.  Psychiatric:        Mood and Affect: Mood normal.     Labs reviewed: Recent Labs    09/07/23 0800 09/09/23 1522 09/11/23 0000 12/14/23 0000 01/29/24 0635 02/15/24 0000  NA 142 135   < > 142 143 142  K 3.9 3.5   < > 3.8 3.8 3.6  CL 104 97*   < > 101 105 105  CO2 30 27   < > 32* 29 28*  GLUCOSE 159* 323*  --   --  113*  --   BUN 17 14   < > 18 12 12   CREATININE 0.80 0.91   < > 1.1 0.79 0.7  CALCIUM 9.1 9.0   < > 8.9 8.8 8.7   < > = values in this interval not displayed.   Recent Labs    09/07/23 0800 09/09/23 1522 12/14/23 0000 02/15/24 0000  AST 11 41 22 17  ALT 11 24 37* 20  ALKPHOS  --  63 76 79  BILITOT 0.5 0.4  --   --   PROT 6.2 7.4  --   --   ALBUMIN  --  4.2 3.4* 3.6   Recent Labs    09/07/23 0800 09/09/23 1522 09/11/23 0000 01/29/24 0635 02/15/24 0000 03/19/24 0000  WBC 6.9 9.1   < > 8.9 8.2 9.4  NEUTROABS 4,982 7.0   < > 7,102 6,207.00 7,285.00  HGB 11.5* 12.3   < > 11.1* 11.1* 11.3*  HCT 35.2 36.6   < >  33.5* 34* 35*  MCV 89.1 88.2  --  86.8  --   --   PLT 203 247   < > 222 206 226   < > = values in this interval not displayed.   Lab Results  Component Value Date   TSH 2.930 04/04/2023   Lab Results  Component Value Date   HGBA1C 8.6 12/14/2023   Lab Results  Component Value Date   CHOL 167 02/13/2023   HDL 43 (L)  02/13/2023   LDLCALC 88 02/13/2023   LDLDIRECT 104.0 10/29/2021   TRIG 302 (H) 02/13/2023   CHOLHDL 3.9 02/13/2023    Results A1c: 8.6 (11/2023)   Creatinine: 0.68 (01/2024)   GFR: 86 (01/2024)   Potassium: 3.6 (01/2024)   Sodium: 142 (01/2024)   Hemoglobin: 11.1 (01/2024)   Total Iron: 47 (01/2024)    Assessment and Plan Dementia with behavioral disturbances Managed with Depakote 125 mg, trazodone  50 mg, and mirtazapine 15 mg for appetite stimulation due to poor oral intake. - Check Depakote level to ensure therapeutic range.  Type 2 diabetes mellitus with obesity Recent weight loss from 181 lbs to 174 lbs, which is desirable. Last A1c in March was 8.6. Potential for reduced medication needs due to weight loss. - Order A1c test to assess current diabetes control. - Consider discontinuing Amaryl  if A1c indicates improved control.  Chronic anemia Slight anemia with hemoglobin at 11.1. Iron studies suggest non-iron-related anemia. Possible absorption issue or medication-related. - Check B12 and folic acid levels to assess for potential deficiencies.  Atrial fibrillation Managed with Eliquis. No signs of bleeding reported.   Family/ staff Communication: nursing  Labs/tests ordered:  CBC, Bmp A1c

## 2024-04-25 DIAGNOSIS — Z79899 Other long term (current) drug therapy: Secondary | ICD-10-CM | POA: Diagnosis not present

## 2024-04-25 DIAGNOSIS — E119 Type 2 diabetes mellitus without complications: Secondary | ICD-10-CM | POA: Diagnosis not present

## 2024-04-25 DIAGNOSIS — D649 Anemia, unspecified: Secondary | ICD-10-CM | POA: Diagnosis not present

## 2024-04-25 LAB — VITAMIN B12: Vitamin B-12: 546

## 2024-04-25 LAB — HEMOGLOBIN A1C: Hemoglobin A1C: 7.3

## 2024-05-08 ENCOUNTER — Non-Acute Institutional Stay (SKILLED_NURSING_FACILITY): Payer: Self-pay | Admitting: Student

## 2024-05-08 ENCOUNTER — Encounter: Payer: Self-pay | Admitting: Student

## 2024-05-08 DIAGNOSIS — F03B4 Unspecified dementia, moderate, with anxiety: Secondary | ICD-10-CM

## 2024-05-08 DIAGNOSIS — D329 Benign neoplasm of meninges, unspecified: Secondary | ICD-10-CM | POA: Diagnosis not present

## 2024-05-08 DIAGNOSIS — E440 Moderate protein-calorie malnutrition: Secondary | ICD-10-CM

## 2024-05-08 NOTE — Progress Notes (Signed)
 Location:  Other [place] [facility].  Nursing Home Room Number: [facility] [id number] Place of Service:  SNF [phone]) Provider:  Abdul Fine, MD  Patient Care Team: Abdul Fine, MD as PCP - General (Family Medicine) [name], MD as PCP - Cardiology (Cardiology) Myra Rosaline FALCON, [facility] (Inactive) as Pharmacist (Pharmacist) [name], MD as Consulting Physician (Ophthalmology)  Extended Emergency Contact Information Primary Emergency Contact: Kalka,Fred Address: [address]          [place] [address] [place] Home Phone: [phone] Relation: Spouse Secondary Emergency Contact: Jones,Nancy Mobile Phone: [phone] Relation: Daughter Preferred language: English  Code Status:  DNR Goals of care: Advanced Directive information    03/19/2024    2:49 PM  Advanced Directives  Does Patient Have a Medical Advance Directive? Yes  Type of Advance Directive Living will;Out of facility DNR (pink MOST or yellow form)  Does patient want to make changes to medical advance directive? No - Patient declined     Chief Complaint  Patient presents with   Behaviors    Behaviors.     HPI:  Pt is a 83 y.o. female seen today for an acute visit for Behaviors.  History of Present Illness The patient, with a history of brain tumors, presents with confusion and leaning to the right.  She has been experiencing confusion and a sensation of falling, often stating 'I feel like I'm falling.' She has been observed with her head down in her food and leaning towards the right side in her chair. These symptoms have been present over the past few days, with a history of similar issues noted by her family in July.  She has a history of brain tumors, which are no longer being actively treated. She does not report consistent headaches but does experience neck pain and occasional headaches.  Over the past six months, she has experienced a notable decline, with symptoms of  disorientation and agitation. She sometimes appears to be asleep but is actually unaware of her surroundings, and at other times she consciously puts her head down due to feeling unwell.  Earlier in the year, she experienced weight loss, and there were attempts to encourage her to eat more by having her husband join her for meals, but this was not consistently followed through.   Past Medical History:  Diagnosis Date   Actinic keratosis 06/25/2020   L pretibia distal - bx proven    Balance problems    BCC (basal cell carcinoma of skin) 12/17/2020   right temple, Moh's 01/19/2021   Brain tumor (HCC)    a. Left intraventricular tumor ->stable by 06/2017 MRI. Followed @ [facility].   Dysplastic nevus 06/25/2020   R flank - moderate   Gait disturbance    a. Unsteady on feet/balance difficulty.   Generalized osteoarthritis of multiple sites    GERD (gastroesophageal reflux disease)    History of DVT (deep vein thrombosis) 2016   estrogen and airflights. Rx with xarelto  for 3 months   History of stress test    a. 11/2016 ETT: No ST/T changes.  Performed to assess PAC/PVC burden with activity ->No ectopy noted.   Hypertension    Hyperthyroidism    treated briefly in college   Insomnia    Obesity    PAF (paroxysmal atrial fibrillation) (HCC)    a. 12/2016 Event Monitor: brief run of PAF-->CHA2DS2VASc = 5--> Xarelto .   Plantar fasciitis    Retinal tear of left eye    Rosacea  SCC (squamous cell carcinoma) 08/12/2021   left chest treated with ED& C 04/06/22   Squamous cell carcinoma of skin 09/07/2017   R dorsum of hand    Type 2 diabetes, controlled, with peripheral neuropathy (HCC)    a. 11/2016 A1c 7.4.   Uterine fibroid    Past Surgical History:  Procedure Laterality Date   CARPAL TUNNEL RELEASE Right    Dr Sissy   CATARACT EXTRACTION     Gamma knife  07/2020   intraventricular mass---meningioma or choroid plexus papilloma   RETINAL TEAR REPAIR CRYOTHERAPY     10/09/13, then  again 3/15    Allergies  Allergen Reactions   Penicillins     Has patient had a PCN reaction causing immediate rash, facial/tongue/throat swelling, SOB or lightheadedness with hypotension: Yes Has patient had a PCN reaction causing severe rash involving mucus membranes or skin necrosis: Yes Has patient had a PCN reaction that required hospitalization No Has patient had a PCN reaction occurring within the last 10 years: No If all of the above answers are NO, then may proceed with Cephalosporin use.     Sulfa Antibiotics     Rash/itching   Azithromycin Palpitations    Outpatient Encounter Medications as of 05/08/2024  Medication Sig   apixaban (ELIQUIS) 5 MG TABS tablet Take 5 mg by mouth 2 (two) times daily.   divalproex (DEPAKOTE) 125 MG DR tablet Take 125 mg by mouth daily.   empagliflozin  (JARDIANCE ) 25 MG TABS tablet Take 1 tablet (25 mg total) by mouth daily before breakfast.   glimepiride  (AMARYL ) 2 MG tablet Take 2 mg by mouth daily.   hydrocortisone (ANUSOL-HC) 2.5 % rectal cream Place 1 Application rectally every 6 (six) hours as needed for hemorrhoids or anal itching.   losartan  (COZAAR ) 100 MG tablet Take 100 mg by mouth daily.   metoprolol  succinate (TOPROL -XL) 25 MG 24 hr tablet Take 1 tablet (25 mg total) by mouth daily.   mirtazapine (REMERON) 15 MG tablet Take 15 mg by mouth at bedtime.   Multiple Vitamin (MULTIVITAMIN) tablet Take 1 tablet by mouth daily.   polyethylene glycol (MIRALAX / GLYCOLAX) 17 g packet Take 17 g by mouth daily.   sitaGLIPtin (JANUVIA) 100 MG tablet Take 100 mg by mouth daily.   traZODone  (DESYREL ) 50 MG tablet Take 50 mg by mouth at bedtime.   ferrous sulfate 325 (65 FE) MG tablet Take 325 mg by mouth every Monday, Wednesday, and Friday. (Patient not taking: Reported on 05/08/2024)   glimepiride  (AMARYL ) 1 MG tablet Take 2 mg by mouth daily with breakfast. (Patient not taking: Reported on 05/08/2024)   No facility-administered encounter  medications on file as of 05/08/2024.    Review of Systems  Immunization History  Administered Date(s) Administered   Influenza, High Dose Seasonal PF 07/09/2014, 07/23/2015, 06/02/2016, 07/02/2019, 07/13/2022   Influenza,inj,Quad PF,6+ Mos 06/23/2017, 07/17/2018   Influenza-Unspecified 07/23/2015, 06/22/2020, 07/08/2021, 07/20/2023   Moderna Covid-19 Fall Seasonal Vaccine 48yrs & older 01/12/2023, 01/05/2024   Moderna Covid-19 Vaccine  Bivalent Booster 63yrs & up 02/22/2022   Moderna Sars-Covid-2 Vaccination 10/08/2019, 11/05/2019, 07/24/2020, 02/28/2021, 08/05/2022   Pfizer Covid-19 Vaccine Bivalent Booster 89yrs & up 06/17/2021   Pneumococcal Conjugate-13 01/30/2014   Pneumococcal Polysaccharide-23 09/13/2004, 11/03/2016   RSV,unspecified 09/13/2023   Tdap 10/23/2012, 02/08/2023   Unspecified SARS-COV-2 Vaccination 06/23/2023   Zoster Recombinant(Shingrix) 11/19/2018, 04/11/2019   Zoster, Live 10/19/2006   Pertinent  Health Maintenance Due  Topic Date Due   OPHTHALMOLOGY EXAM  03/06/2024  INFLUENZA VACCINE  04/26/2024   HEMOGLOBIN A1C  10/26/2024   FOOT EXAM  03/19/2025   DEXA SCAN  Completed   MAMMOGRAM  Discontinued      10/14/2020   10:47 AM 10/29/2021    9:41 AM 08/03/2022    1:10 PM 02/28/2023    9:50 AM 03/19/2024    3:36 PM  Fall Risk  Falls in the past year? 0 0 1 0 1  Was there an injury with Fall?   0 0 0  Fall Risk Category Calculator   2 0 2  Fall Risk Category (Retired)   Moderate     (RETIRED) Patient Fall Risk Level   Moderate fall risk     Patient at Risk for Falls Due to   History of fall(s)  History of fall(s);Impaired balance/gait;Impaired mobility  Fall risk Follow up   Falls evaluation completed   Falls evaluation completed     Data saved with a previous flowsheet row definition   Functional Status Survey:    Vitals:   05/08/24 1216  BP: 112/64  Pulse: 87  Resp: 20  Temp: 97.7 F (36.5 C)  SpO2: 94%  Weight: 171 lb 9.6 oz (77.8 kg)  Height:  5' 6 (1.676 m)   Body mass index is 27.7 kg/m. Physical Exam Pulmonary:     Effort: Pulmonary effort is normal.  Neurological:     General: No focal deficit present.     Mental Status: She is alert. Mental status is at baseline. She is disoriented.     Labs reviewed: Recent Labs    09/07/23 0800 09/09/23 1522 09/11/23 0000 12/14/23 0000 01/29/24 0635 02/15/24 0000  NA 142 135   < > 142 143 142  K 3.9 3.5   < > 3.8 3.8 3.6  CL 104 97*   < > 101 105 105  CO2 30 27   < > 32* 29 28*  GLUCOSE 159* 323*  --   --  113*  --   BUN 17 14   < > 18 12 12   CREATININE 0.80 0.91   < > 1.1 0.79 0.7  CALCIUM 9.1 9.0   < > 8.9 8.8 8.7   < > = values in this interval not displayed.   Recent Labs    09/07/23 0800 09/09/23 1522 12/14/23 0000 02/15/24 0000  AST 11 41 22 17  ALT 11 24 37* 20  ALKPHOS  --  63 76 79  BILITOT 0.5 0.4  --   --   PROT 6.2 7.4  --   --   ALBUMIN  --  4.2 3.4* 3.6   Recent Labs    09/07/23 0800 09/09/23 1522 09/11/23 0000 01/29/24 0635 02/15/24 0000 03/19/24 0000  WBC 6.9 9.1   < > 8.9 8.2 9.4  NEUTROABS 4,982 7.0   < > 7,102 6,207.00 7,285.00  HGB 11.5* 12.3   < > 11.1* 11.1* 11.3*  HCT 35.2 36.6   < > 33.5* 34* 35*  MCV 89.1 88.2  --  86.8  --   --   PLT 203 247   < > 222 206 226   < > = values in this interval not displayed.   Lab Results  Component Value Date   TSH 2.930 04/04/2023   Lab Results  Component Value Date   HGBA1C 7.3 04/25/2024   Lab Results  Component Value Date   CHOL 167 02/13/2023   HDL 43 (L) 02/13/2023   LDLCALC 88 02/13/2023  LDLDIRECT 104.0 10/29/2021   TRIG 302 (H) 02/13/2023   CHOLHDL 3.9 02/13/2023    Significant Diagnostic Results in last 30 days:  No results found.  Assessment/Plan Meningioma Tenisha has untreated brain tumors, potentially causing increased intracranial pressure due to obstructed cerebrospinal fluid flow, contributing to her symptoms. Imaging is not pursued due to her health status  and lack of treatment options. Daughter is considering further discussions with children and father. Dementia with functional decline Christiona has experienced a notable decline over the last six months, with symptoms including confusion, agitation, and disorientation. Her cognitive decline impairs her ability to articulate symptoms such as dizziness.  Right-sided leaning and head drop (possible neurologic decline) Judah exhibits right-sided leaning and head drop, suggesting possible neurologic decline. Differential diagnoses include stroke, increased intracranial pressure, or tumor growth. Given her current health status and lack of treatment options, further imaging is not pursued.  Intermittent headache and neck pain Intermittent headaches and neck pain are likely due to positioning and lack of support while sitting or sleeping. Neck pain may be exacerbated by head positioning and lack of pillow use during sleep. - Schedule Tylenol for neck pain management.  Protein Calorie Deficit Caci experienced unintentional weight loss earlier in the year. Efforts to encourage her husband to assist with mealtime support were not successful. Family/ staff Communication: nursing  Labs/tests ordered: None

## 2024-05-09 ENCOUNTER — Encounter: Payer: Self-pay | Admitting: Student

## 2024-05-15 DIAGNOSIS — F03C11 Unspecified dementia, severe, with agitation: Secondary | ICD-10-CM | POA: Diagnosis not present

## 2024-05-15 DIAGNOSIS — F03C3 Unspecified dementia, severe, with mood disturbance: Secondary | ICD-10-CM | POA: Diagnosis not present

## 2024-05-15 DIAGNOSIS — F419 Anxiety disorder, unspecified: Secondary | ICD-10-CM | POA: Diagnosis not present

## 2024-05-15 DIAGNOSIS — F03C4 Unspecified dementia, severe, with anxiety: Secondary | ICD-10-CM | POA: Diagnosis not present

## 2024-05-15 DIAGNOSIS — F5104 Psychophysiologic insomnia: Secondary | ICD-10-CM | POA: Diagnosis not present

## 2024-05-17 DIAGNOSIS — B351 Tinea unguium: Secondary | ICD-10-CM | POA: Diagnosis not present

## 2024-05-17 DIAGNOSIS — E1159 Type 2 diabetes mellitus with other circulatory complications: Secondary | ICD-10-CM | POA: Diagnosis not present

## 2024-05-21 ENCOUNTER — Ambulatory Visit: Payer: Medicare Other | Admitting: Dermatology

## 2024-05-22 DIAGNOSIS — F03B4 Unspecified dementia, moderate, with anxiety: Secondary | ICD-10-CM | POA: Diagnosis not present

## 2024-05-30 DIAGNOSIS — F03B18 Unspecified dementia, moderate, with other behavioral disturbance: Secondary | ICD-10-CM | POA: Diagnosis not present

## 2024-05-30 DIAGNOSIS — E1142 Type 2 diabetes mellitus with diabetic polyneuropathy: Secondary | ICD-10-CM | POA: Diagnosis not present

## 2024-05-30 DIAGNOSIS — M6281 Muscle weakness (generalized): Secondary | ICD-10-CM | POA: Diagnosis not present

## 2024-05-30 DIAGNOSIS — M1711 Unilateral primary osteoarthritis, right knee: Secondary | ICD-10-CM | POA: Diagnosis not present

## 2024-05-30 DIAGNOSIS — I10 Essential (primary) hypertension: Secondary | ICD-10-CM | POA: Diagnosis not present

## 2024-05-30 DIAGNOSIS — F03B4 Unspecified dementia, moderate, with anxiety: Secondary | ICD-10-CM | POA: Diagnosis not present

## 2024-05-30 DIAGNOSIS — M179 Osteoarthritis of knee, unspecified: Secondary | ICD-10-CM | POA: Diagnosis not present

## 2024-05-30 DIAGNOSIS — R2689 Other abnormalities of gait and mobility: Secondary | ICD-10-CM | POA: Diagnosis not present

## 2024-05-30 DIAGNOSIS — D329 Benign neoplasm of meninges, unspecified: Secondary | ICD-10-CM | POA: Diagnosis not present

## 2024-06-06 ENCOUNTER — Encounter: Payer: Self-pay | Admitting: Nurse Practitioner

## 2024-06-06 ENCOUNTER — Non-Acute Institutional Stay (SKILLED_NURSING_FACILITY): Payer: Self-pay | Admitting: Nurse Practitioner

## 2024-06-06 DIAGNOSIS — E440 Moderate protein-calorie malnutrition: Secondary | ICD-10-CM | POA: Diagnosis not present

## 2024-06-06 DIAGNOSIS — U071 COVID-19: Secondary | ICD-10-CM

## 2024-06-06 DIAGNOSIS — Z7901 Long term (current) use of anticoagulants: Secondary | ICD-10-CM

## 2024-06-06 DIAGNOSIS — F03B4 Unspecified dementia, moderate, with anxiety: Secondary | ICD-10-CM

## 2024-06-06 DIAGNOSIS — I480 Paroxysmal atrial fibrillation: Secondary | ICD-10-CM

## 2024-06-06 DIAGNOSIS — E1142 Type 2 diabetes mellitus with diabetic polyneuropathy: Secondary | ICD-10-CM

## 2024-06-06 DIAGNOSIS — K5900 Constipation, unspecified: Secondary | ICD-10-CM

## 2024-06-06 DIAGNOSIS — F4321 Adjustment disorder with depressed mood: Secondary | ICD-10-CM | POA: Diagnosis not present

## 2024-06-06 NOTE — Assessment & Plan Note (Signed)
 Progressive decline, continues with supportive from staff and family No increase in behaviors.

## 2024-06-06 NOTE — Progress Notes (Signed)
 Location:  Other [facility].  Nursing Home Room Number: [facility] [id number] Place of Service:  SNF [phone]) [name], NP  PCP: Abdul Fine, MD  Patient Care Team: Abdul Fine, MD as PCP - General (Family Medicine) [name], MD as PCP - Cardiology (Cardiology) [name], MD as Consulting Physician (Ophthalmology)  Extended Emergency Contact Information Primary Emergency Contact: Fehrenbach,Fred Address: [address]          [place] [address] [place] Home Phone: [phone] Relation: Spouse Secondary Emergency Contact: Jones,Nancy Mobile Phone: [phone] Relation: Daughter Preferred language: English  Goals of care: Advanced Directive information    06/06/2024   12:16 PM  Advanced Directives  Does Patient Have a Medical Advance Directive? Yes  Type of Advance Directive Living will;Out of facility DNR (pink MOST or yellow form)  Does patient want to make changes to medical advance directive? No - Patient declined     Chief Complaint  Patient presents with   Medical Management of Chronic Issues    Medical Management of Chronic Issues.     HPI:  Pt is a 83 y.o. female seen today for medical management of chronic disease.  She has hx of dementia, DM, gait disturbance, hx of brain tumor, OA, HTN, insomnia. She is currently has COVID but doing well with minimal symptoms. Reports occasional cough but no congestion, shortness of breath.  Denies increase in fatigue. No fever or chills.  Reports she has good appetite. Has had positive weight gain over the last month.  Denies pain at this time No constipation or diarrhea.  Staff has no concerns at this time.    Past Medical History:  Diagnosis Date   Actinic keratosis 06/25/2020   L pretibia distal - bx proven    Balance problems    BCC (basal cell carcinoma of skin) 12/17/2020   right temple, Moh's 01/19/2021   Brain tumor (HCC)    a. Left intraventricular tumor  ->stable by 06/2017 MRI. Followed @ [facility].   Dysplastic nevus 06/25/2020   R flank - moderate   Gait disturbance    a. Unsteady on feet/balance difficulty.   Generalized osteoarthritis of multiple sites    GERD (gastroesophageal reflux disease)    History of DVT (deep vein thrombosis) 2016   estrogen and airflights. Rx with xarelto  for 3 months   History of stress test    a. 11/2016 ETT: No ST/T changes.  Performed to assess PAC/PVC burden with activity ->No ectopy noted.   Hypertension    Hyperthyroidism    treated briefly in college   Insomnia    Obesity    PAF (paroxysmal atrial fibrillation) (HCC)    a. 12/2016 Event Monitor: brief run of PAF-->CHA2DS2VASc = 5--> Xarelto .   Plantar fasciitis    Retinal tear of left eye    Rosacea    SCC (squamous cell carcinoma) 08/12/2021   left chest treated with ED& C 04/06/22   Squamous cell carcinoma of skin 09/07/2017   R dorsum of hand    Type 2 diabetes, controlled, with peripheral neuropathy (HCC)    a. 11/2016 A1c 7.4.   Uterine fibroid    Past Surgical History:  Procedure Laterality Date   CARPAL TUNNEL RELEASE Right    Dr Sissy   CATARACT EXTRACTION     Gamma knife  07/2020   intraventricular mass---meningioma or choroid plexus papilloma   RETINAL TEAR REPAIR CRYOTHERAPY     10/09/13, then again 3/15    Allergies  Allergen Reactions   Penicillins     Has patient had a PCN reaction causing immediate rash, facial/tongue/throat swelling, SOB or lightheadedness with hypotension: Yes Has patient had a PCN reaction causing severe rash involving mucus membranes or skin necrosis: Yes Has patient had a PCN reaction that required hospitalization No Has patient had a PCN reaction occurring within the last 10 years: No If all of the above answers are NO, then may proceed with Cephalosporin use.     Sulfa Antibiotics     Rash/itching   Azithromycin Palpitations    Outpatient Encounter Medications as of 06/06/2024  Medication  Sig   acetaminophen (TYLENOL) 325 MG tablet Take 650 mg by mouth 3 (three) times daily.   apixaban (ELIQUIS) 5 MG TABS tablet Take 5 mg by mouth 2 (two) times daily.   divalproex (DEPAKOTE) 125 MG DR tablet Take 125 mg by mouth daily.   empagliflozin  (JARDIANCE ) 25 MG TABS tablet Take 1 tablet (25 mg total) by mouth daily before breakfast.   glimepiride  (AMARYL ) 2 MG tablet Take 2 mg by mouth daily.   hydrocortisone (ANUSOL-HC) 2.5 % rectal cream Place 1 Application rectally every 6 (six) hours as needed for hemorrhoids or anal itching.   losartan  (COZAAR ) 100 MG tablet Take 100 mg by mouth daily.   metoprolol  succinate (TOPROL -XL) 25 MG 24 hr tablet Take 1 tablet (25 mg total) by mouth daily.   mirtazapine (REMERON) 15 MG tablet Take 15 mg by mouth at bedtime.   Multiple Vitamin (MULTIVITAMIN) tablet Take 1 tablet by mouth daily.   polyethylene glycol (MIRALAX / GLYCOLAX) 17 g packet Take 17 g by mouth daily.   sitaGLIPtin (JANUVIA) 100 MG tablet Take 100 mg by mouth daily.   traZODone  (DESYREL ) 50 MG tablet Take 50 mg by mouth at bedtime.   ferrous sulfate 325 (65 FE) MG tablet Take 325 mg by mouth every Monday, Wednesday, and Friday. (Patient not taking: Reported on 06/06/2024)   glimepiride  (AMARYL ) 1 MG tablet Take 2 mg by mouth daily with breakfast. (Patient not taking: Reported on 06/06/2024)   No facility-administered encounter medications on file as of 06/06/2024.    Review of Systems  Constitutional:  Negative for activity change, appetite change, fatigue and unexpected weight change.  HENT:  Negative for congestion and hearing loss.   Eyes: Negative.   Respiratory:  Negative for shortness of breath. Cough: occasional.  Cardiovascular:  Negative for chest pain, palpitations and leg swelling.  Gastrointestinal:  Negative for abdominal pain, constipation and diarrhea.  Genitourinary:  Negative for difficulty urinating and dysuria.  Musculoskeletal:  Negative for arthralgias and  myalgias.  Skin:  Negative for color change and wound.  Neurological:  Negative for dizziness and weakness.  Psychiatric/Behavioral:  Positive for confusion. Negative for agitation and behavioral problems.      Immunization History  Administered Date(s) Administered   INFLUENZA, HIGH DOSE SEASONAL PF 07/09/2014, 07/23/2015, 06/02/2016, 07/02/2019, 07/13/2022   Influenza,inj,Quad PF,6+ Mos 06/23/2017, 07/17/2018   Influenza-Unspecified 07/23/2015, 06/22/2020, 07/08/2021, 07/20/2023   Moderna Covid-19 Fall Seasonal Vaccine 34yrs & older 01/12/2023, 01/05/2024   Moderna Covid-19 Vaccine  Bivalent Booster 81yrs & up 02/22/2022   Moderna Sars-Covid-2 Vaccination 10/08/2019, 11/05/2019, 07/24/2020, 02/28/2021, 08/05/2022   Pfizer Covid-19 Vaccine Bivalent Booster 53yrs & up 06/17/2021   Pneumococcal Conjugate-13 01/30/2014   Pneumococcal Polysaccharide-23 09/13/2004, 11/03/2016   RSV,unspecified 09/13/2023   Tdap 10/23/2012, 02/08/2023   Unspecified SARS-COV-2 Vaccination 06/23/2023   Zoster Recombinant(Shingrix) 11/19/2018, 04/11/2019   Zoster, Live 10/19/2006   Pertinent  Health Maintenance Due  Topic Date Due   OPHTHALMOLOGY EXAM  03/06/2024   Influenza Vaccine  04/26/2024   HEMOGLOBIN A1C  10/26/2024   FOOT EXAM  05/17/2025   DEXA SCAN  Completed   Mammogram  Discontinued      10/14/2020   10:47 AM 10/29/2021    9:41 AM 08/03/2022    1:10 PM 02/28/2023    9:50 AM 03/19/2024    3:36 PM  Fall Risk  Falls in the past year? 0 0 1 0 1  Was there an injury with Fall?   0 0 0  Fall Risk Category Calculator   2 0 2  Fall Risk Category (Retired)   Moderate     (RETIRED) Patient Fall Risk Level   Moderate fall risk     Patient at Risk for Falls Due to   History of fall(s)  History of fall(s);Impaired balance/gait;Impaired mobility  Fall risk Follow up   Falls evaluation completed   Falls evaluation completed     Data saved with a previous flowsheet row definition   Functional Status  Survey:    Vitals:   06/06/24 1210  BP: 137/76  Pulse: 74  Resp: 20  Temp: 97.7 F (36.5 C)  SpO2: 94%  Weight: 174 lb (78.9 kg)  Height: 5' 6 (1.676 m)   Body mass index is 28.08 kg/m. Wt Readings from Last 3 Encounters:  06/06/24 174 lb (78.9 kg)  05/08/24 171 lb 9.6 oz (77.8 kg)  04/22/24 174 lb 3.2 oz (79 kg)    Physical Exam Constitutional:      General: She is not in acute distress.    Appearance: She is well-developed. She is not diaphoretic.  HENT:     Head: Normocephalic and atraumatic.     Mouth/Throat:     Pharynx: No oropharyngeal exudate.  Eyes:     Conjunctiva/sclera: Conjunctivae normal.     Pupils: Pupils are equal, round, and reactive to light.  Cardiovascular:     Rate and Rhythm: Normal rate and regular rhythm.     Heart sounds: Normal heart sounds.  Pulmonary:     Effort: Pulmonary effort is normal.     Breath sounds: Normal breath sounds.  Abdominal:     General: Bowel sounds are normal.     Palpations: Abdomen is soft.  Musculoskeletal:     Cervical back: Normal range of motion and neck supple.     Right lower leg: No edema.     Left lower leg: No edema.  Skin:    General: Skin is warm and dry.  Neurological:     Mental Status: She is alert.     Motor: Weakness present.     Gait: Gait abnormal.  Psychiatric:        Mood and Affect: Mood normal.     Labs reviewed: Recent Labs    09/07/23 0800 09/09/23 1522 09/11/23 0000 12/14/23 0000 01/29/24 0635 02/15/24 0000  NA 142 135   < > 142 143 142  K 3.9 3.5   < > 3.8 3.8 3.6  CL 104 97*   < > 101 105 105  CO2 30 27   < > 32* 29 28*  GLUCOSE 159* 323*  --   --  113*  --   BUN 17 14   < > 18 12 12   CREATININE 0.80 0.91   < > 1.1 0.79 0.7  CALCIUM 9.1 9.0   < > 8.9 8.8 8.7   < > =  values in this interval not displayed.   Recent Labs    09/07/23 0800 09/09/23 1522 12/14/23 0000 02/15/24 0000  AST 11 41 22 17  ALT 11 24 37* 20  ALKPHOS  --  63 76 79  BILITOT 0.5 0.4  --    --   PROT 6.2 7.4  --   --   ALBUMIN  --  4.2 3.4* 3.6   Recent Labs    09/07/23 0800 09/09/23 1522 09/11/23 0000 01/29/24 0635 02/15/24 0000 03/19/24 0000  WBC 6.9 9.1   < > 8.9 8.2 9.4  NEUTROABS 4,982 7.0   < > 7,102 6,207.00 7,285.00  HGB 11.5* 12.3   < > 11.1* 11.1* 11.3*  HCT 35.2 36.6   < > 33.5* 34* 35*  MCV 89.1 88.2  --  86.8  --   --   PLT 203 247   < > 222 206 226   < > = values in this interval not displayed.   Lab Results  Component Value Date   TSH 2.930 04/04/2023   Lab Results  Component Value Date   HGBA1C 7.3 04/25/2024   Lab Results  Component Value Date   CHOL 167 02/13/2023   HDL 43 (L) 02/13/2023   LDLCALC 88 02/13/2023   LDLDIRECT 104.0 10/29/2021   TRIG 302 (H) 02/13/2023   CHOLHDL 3.9 02/13/2023    Significant Diagnostic Results in last 30 days:  No results found.  Assessment/Plan Chronic anticoagulation Due to a fib, continues on eliquis   Moderate dementia with anxiety (HCC) Progressive decline, continues with supportive from staff and family No increase in behaviors.   Moderate protein-calorie malnutrition (HCC) Stable at this time, continues suppplement and remeron for appetite.   PAF (paroxysmal atrial fibrillation) (HCC) Rate controlled on metoprolol .   Type 2 diabetes, controlled, with peripheral neuropathy (HCC) Blood sugars are better controlled at this time, will continue current regimen. No hypoglycemia noted. A1c improvement on current regimen   COVID Noted to be COVID positive  Minimal symptoms Continue symptom management    Justis Closser K. Caro BODILY [facility] [phone]

## 2024-06-06 NOTE — Assessment & Plan Note (Signed)
 Due to a fib, continues on eliquis.

## 2024-06-06 NOTE — Assessment & Plan Note (Signed)
 Stable at this time, continues suppplement and remeron for appetite.

## 2024-06-06 NOTE — Assessment & Plan Note (Signed)
 Blood sugars are better controlled at this time, will continue current regimen. No hypoglycemia noted. A1c improvement on current regimen

## 2024-06-06 NOTE — Assessment & Plan Note (Signed)
Rate controlled on metoprolol.  °

## 2024-06-11 DIAGNOSIS — M6281 Muscle weakness (generalized): Secondary | ICD-10-CM | POA: Diagnosis not present

## 2024-06-11 DIAGNOSIS — M179 Osteoarthritis of knee, unspecified: Secondary | ICD-10-CM | POA: Diagnosis not present

## 2024-06-11 DIAGNOSIS — F03B18 Unspecified dementia, moderate, with other behavioral disturbance: Secondary | ICD-10-CM | POA: Diagnosis not present

## 2024-06-11 DIAGNOSIS — M1711 Unilateral primary osteoarthritis, right knee: Secondary | ICD-10-CM | POA: Diagnosis not present

## 2024-06-11 DIAGNOSIS — F03B4 Unspecified dementia, moderate, with anxiety: Secondary | ICD-10-CM | POA: Diagnosis not present

## 2024-06-11 DIAGNOSIS — I10 Essential (primary) hypertension: Secondary | ICD-10-CM | POA: Diagnosis not present

## 2024-06-13 DIAGNOSIS — M179 Osteoarthritis of knee, unspecified: Secondary | ICD-10-CM | POA: Diagnosis not present

## 2024-06-13 DIAGNOSIS — F03B4 Unspecified dementia, moderate, with anxiety: Secondary | ICD-10-CM | POA: Diagnosis not present

## 2024-06-13 DIAGNOSIS — F03B18 Unspecified dementia, moderate, with other behavioral disturbance: Secondary | ICD-10-CM | POA: Diagnosis not present

## 2024-06-13 DIAGNOSIS — I10 Essential (primary) hypertension: Secondary | ICD-10-CM | POA: Diagnosis not present

## 2024-06-13 DIAGNOSIS — M1711 Unilateral primary osteoarthritis, right knee: Secondary | ICD-10-CM | POA: Diagnosis not present

## 2024-06-13 DIAGNOSIS — M6281 Muscle weakness (generalized): Secondary | ICD-10-CM | POA: Diagnosis not present

## 2024-06-13 DIAGNOSIS — F4321 Adjustment disorder with depressed mood: Secondary | ICD-10-CM | POA: Diagnosis not present

## 2024-06-20 DIAGNOSIS — I10 Essential (primary) hypertension: Secondary | ICD-10-CM | POA: Diagnosis not present

## 2024-06-20 DIAGNOSIS — F03B4 Unspecified dementia, moderate, with anxiety: Secondary | ICD-10-CM | POA: Diagnosis not present

## 2024-06-20 DIAGNOSIS — F03B18 Unspecified dementia, moderate, with other behavioral disturbance: Secondary | ICD-10-CM | POA: Diagnosis not present

## 2024-06-20 DIAGNOSIS — M179 Osteoarthritis of knee, unspecified: Secondary | ICD-10-CM | POA: Diagnosis not present

## 2024-06-20 DIAGNOSIS — M1711 Unilateral primary osteoarthritis, right knee: Secondary | ICD-10-CM | POA: Diagnosis not present

## 2024-06-20 DIAGNOSIS — M6281 Muscle weakness (generalized): Secondary | ICD-10-CM | POA: Diagnosis not present

## 2024-06-22 DIAGNOSIS — F03B18 Unspecified dementia, moderate, with other behavioral disturbance: Secondary | ICD-10-CM | POA: Diagnosis not present

## 2024-06-22 DIAGNOSIS — M179 Osteoarthritis of knee, unspecified: Secondary | ICD-10-CM | POA: Diagnosis not present

## 2024-06-22 DIAGNOSIS — M6281 Muscle weakness (generalized): Secondary | ICD-10-CM | POA: Diagnosis not present

## 2024-06-22 DIAGNOSIS — F03B4 Unspecified dementia, moderate, with anxiety: Secondary | ICD-10-CM | POA: Diagnosis not present

## 2024-06-22 DIAGNOSIS — M1711 Unilateral primary osteoarthritis, right knee: Secondary | ICD-10-CM | POA: Diagnosis not present

## 2024-06-22 DIAGNOSIS — I10 Essential (primary) hypertension: Secondary | ICD-10-CM | POA: Diagnosis not present

## 2024-06-25 DIAGNOSIS — F4321 Adjustment disorder with depressed mood: Secondary | ICD-10-CM | POA: Diagnosis not present

## 2024-07-10 ENCOUNTER — Inpatient Hospital Stay (HOSPITAL_COMMUNITY)

## 2024-07-10 ENCOUNTER — Encounter: Payer: Self-pay | Admitting: Nurse Practitioner

## 2024-07-10 ENCOUNTER — Emergency Department (HOSPITAL_COMMUNITY): Admission: EM | Admit: 2024-07-10 | Discharge: 2024-07-10 | Disposition: A | Discharge status: Home / Self Care

## 2024-07-10 ENCOUNTER — Non-Acute Institutional Stay (SKILLED_NURSING_FACILITY): Admitting: Nurse Practitioner

## 2024-07-10 ENCOUNTER — Emergency Department (HOSPITAL_COMMUNITY)

## 2024-07-10 DIAGNOSIS — R29818 Other symptoms and signs involving the nervous system: Secondary | ICD-10-CM | POA: Diagnosis not present

## 2024-07-10 DIAGNOSIS — R4182 Altered mental status, unspecified: Secondary | ICD-10-CM | POA: Diagnosis not present

## 2024-07-10 DIAGNOSIS — Z7984 Long term (current) use of oral hypoglycemic drugs: Secondary | ICD-10-CM | POA: Diagnosis not present

## 2024-07-10 DIAGNOSIS — E1140 Type 2 diabetes mellitus with diabetic neuropathy, unspecified: Secondary | ICD-10-CM | POA: Insufficient documentation

## 2024-07-10 DIAGNOSIS — G9389 Other specified disorders of brain: Secondary | ICD-10-CM | POA: Diagnosis not present

## 2024-07-10 DIAGNOSIS — R1110 Vomiting, unspecified: Secondary | ICD-10-CM | POA: Diagnosis not present

## 2024-07-10 DIAGNOSIS — I619 Nontraumatic intracerebral hemorrhage, unspecified: Secondary | ICD-10-CM | POA: Diagnosis present

## 2024-07-10 DIAGNOSIS — Z7901 Long term (current) use of anticoagulants: Secondary | ICD-10-CM | POA: Diagnosis not present

## 2024-07-10 DIAGNOSIS — I10 Essential (primary) hypertension: Secondary | ICD-10-CM | POA: Insufficient documentation

## 2024-07-10 DIAGNOSIS — D72829 Elevated white blood cell count, unspecified: Secondary | ICD-10-CM | POA: Insufficient documentation

## 2024-07-10 DIAGNOSIS — D329 Benign neoplasm of meninges, unspecified: Secondary | ICD-10-CM | POA: Diagnosis not present

## 2024-07-10 DIAGNOSIS — R569 Unspecified convulsions: Secondary | ICD-10-CM | POA: Diagnosis not present

## 2024-07-10 DIAGNOSIS — F03B4 Unspecified dementia, moderate, with anxiety: Secondary | ICD-10-CM

## 2024-07-10 DIAGNOSIS — G40909 Epilepsy, unspecified, not intractable, without status epilepticus: Secondary | ICD-10-CM | POA: Diagnosis not present

## 2024-07-10 DIAGNOSIS — Z87891 Personal history of nicotine dependence: Secondary | ICD-10-CM | POA: Insufficient documentation

## 2024-07-10 DIAGNOSIS — F0390 Unspecified dementia without behavioral disturbance: Secondary | ICD-10-CM | POA: Diagnosis not present

## 2024-07-10 DIAGNOSIS — R531 Weakness: Principal | ICD-10-CM

## 2024-07-10 DIAGNOSIS — I480 Paroxysmal atrial fibrillation: Secondary | ICD-10-CM | POA: Diagnosis not present

## 2024-07-10 DIAGNOSIS — R2990 Unspecified symptoms and signs involving the nervous system: Secondary | ICD-10-CM

## 2024-07-10 DIAGNOSIS — Z79899 Other long term (current) drug therapy: Secondary | ICD-10-CM | POA: Insufficient documentation

## 2024-07-10 DIAGNOSIS — G8384 Todd's paralysis (postepileptic): Secondary | ICD-10-CM

## 2024-07-10 DIAGNOSIS — R29810 Facial weakness: Secondary | ICD-10-CM | POA: Diagnosis not present

## 2024-07-10 DIAGNOSIS — R4781 Slurred speech: Secondary | ICD-10-CM | POA: Diagnosis not present

## 2024-07-10 DIAGNOSIS — I4891 Unspecified atrial fibrillation: Secondary | ICD-10-CM | POA: Insufficient documentation

## 2024-07-10 DIAGNOSIS — Z743 Need for continuous supervision: Secondary | ICD-10-CM | POA: Diagnosis not present

## 2024-07-10 DIAGNOSIS — R29898 Other symptoms and signs involving the musculoskeletal system: Secondary | ICD-10-CM | POA: Diagnosis not present

## 2024-07-10 DIAGNOSIS — R9082 White matter disease, unspecified: Secondary | ICD-10-CM | POA: Diagnosis not present

## 2024-07-10 DIAGNOSIS — R410 Disorientation, unspecified: Secondary | ICD-10-CM | POA: Diagnosis not present

## 2024-07-10 DIAGNOSIS — R220 Localized swelling, mass and lump, head: Secondary | ICD-10-CM | POA: Diagnosis not present

## 2024-07-10 DIAGNOSIS — R1310 Dysphagia, unspecified: Secondary | ICD-10-CM | POA: Diagnosis not present

## 2024-07-10 HISTORY — DX: Nontraumatic intracerebral hemorrhage, unspecified: I61.9

## 2024-07-10 LAB — CBC
HCT: 39.7 % (ref 36.0–46.0)
Hemoglobin: 13.1 g/dL (ref 12.0–15.0)
MCH: 29 pg (ref 26.0–34.0)
MCHC: 33 g/dL (ref 30.0–36.0)
MCV: 87.8 fL (ref 80.0–100.0)
Platelets: 219 K/uL (ref 150–400)
RBC: 4.52 MIL/uL (ref 3.87–5.11)
RDW: 14.6 % (ref 11.5–15.5)
WBC: 11.2 K/uL — ABNORMAL HIGH (ref 4.0–10.5)
nRBC: 0 % (ref 0.0–0.2)

## 2024-07-10 LAB — ETHANOL: Alcohol, Ethyl (B): <15 mg/dL (ref <15)

## 2024-07-10 LAB — COMPREHENSIVE METABOLIC PANEL WITH GFR
ALT: 45 U/L — ABNORMAL HIGH (ref 0–44)
AST: 28 U/L (ref 15–41)
Albumin: 3.6 g/dL (ref 3.5–5.0)
Alkaline Phosphatase: 85 U/L (ref 38–126)
Anion gap: 13 (ref 5–15)
BUN: 12 mg/dL (ref 8–23)
CO2: 25 mmol/L (ref 22–32)
Calcium: 8.9 mg/dL (ref 8.9–10.3)
Chloride: 103 mmol/L (ref 98–111)
Creatinine, Ser: 0.67 mg/dL (ref 0.44–1.00)
GFR, Estimated: >60 mL/min (ref >60)
Glucose, Bld: 149 mg/dL — ABNORMAL HIGH (ref 70–99)
Potassium: 4 mmol/L (ref 3.5–5.1)
Sodium: 141 mmol/L (ref 135–145)
Total Bilirubin: 0.8 mg/dL (ref 0.0–1.2)
Total Protein: 6.9 g/dL (ref 6.5–8.1)

## 2024-07-10 LAB — PROTIME-INR
INR: 1.1 (ref 0.8–1.2)
Prothrombin Time: 14.5 s (ref 11.4–15.2)

## 2024-07-10 LAB — I-STAT CHEM 8, ED
BUN: 15 mg/dL (ref 8–23)
Calcium, Ion: 1.14 mmol/L — ABNORMAL LOW (ref 1.15–1.40)
Chloride: 103 mmol/L (ref 98–111)
Creatinine, Ser: 0.7 mg/dL (ref 0.44–1.00)
Glucose, Bld: 144 mg/dL — ABNORMAL HIGH (ref 70–99)
HCT: 39 % (ref 36.0–46.0)
Hemoglobin: 13.3 g/dL (ref 12.0–15.0)
Potassium: 4.2 mmol/L (ref 3.5–5.1)
Sodium: 142 mmol/L (ref 135–145)
TCO2: 28 mmol/L (ref 22–32)

## 2024-07-10 LAB — APTT: aPTT: 31 s (ref 24–36)

## 2024-07-10 LAB — DIFFERENTIAL
Abs Immature Granulocytes: 0.07 K/uL (ref 0.00–0.07)
Basophils Absolute: 0 K/uL (ref 0.0–0.1)
Basophils Relative: 0 %
Eosinophils Absolute: 0.1 K/uL (ref 0.0–0.5)
Eosinophils Relative: 1 %
Immature Granulocytes: 1 %
Lymphocytes Relative: 7 %
Lymphs Abs: 0.8 K/uL (ref 0.7–4.0)
Monocytes Absolute: 0.6 K/uL (ref 0.1–1.0)
Monocytes Relative: 5 %
Neutro Abs: 9.7 K/uL — ABNORMAL HIGH (ref 1.7–7.7)
Neutrophils Relative %: 86 %

## 2024-07-10 LAB — CBG MONITORING, ED: Glucose-Capillary: 140 mg/dL — ABNORMAL HIGH (ref 70–99)

## 2024-07-10 MED ORDER — SODIUM CHLORIDE 0.9% FLUSH: 3.0000 mL | Freq: Once | INTRAVENOUS | Status: DC

## 2024-07-10 MED ORDER — VALPROATE SODIUM 100 MG/ML IV SOLN
2250.0000 mg | Freq: Once | INTRAVENOUS | Status: AC
  Administered 2024-07-10: 2250 mg via INTRAVENOUS
  Filled 2024-07-10: qty 22.5

## 2024-07-10 MED ORDER — EMPTY CONTAINERS FLEXIBLE MISC
900.0000 mg | Freq: Once | Status: DC
  Filled 2024-07-10: qty 90

## 2024-07-10 MED ORDER — SENNOSIDES-DOCUSATE SODIUM 8.6-50 MG PO TABS: 1.0000 | ORAL_TABLET | Freq: Two times a day (BID) | ORAL | Status: DC

## 2024-07-10 MED ORDER — LABETALOL HCL 5 MG/ML IV SOLN: 20.0000 mg | Freq: Once | INTRAVENOUS | Status: DC

## 2024-07-10 MED ORDER — STROKE: EARLY STAGES OF RECOVERY BOOK: Freq: Once | Status: DC

## 2024-07-10 MED ORDER — DIVALPROEX SODIUM 125 MG PO DR TAB: 375.0000 mg | DELAYED_RELEASE_TABLET | Freq: Two times a day (BID) | ORAL | 0 refills | Status: DC

## 2024-07-10 MED ORDER — PANTOPRAZOLE SODIUM 40 MG IV SOLR: 40.0000 mg | Freq: Every day | INTRAVENOUS | Status: DC

## 2024-07-10 MED ORDER — ACETAMINOPHEN 325 MG PO TABS: 650.0000 mg | ORAL_TABLET | ORAL | Status: DC | PRN

## 2024-07-10 MED ORDER — IOHEXOL 350 MG/ML SOLN
75.0000 mL | Freq: Once | INTRAVENOUS | Status: AC | PRN
  Administered 2024-07-10: 75 mL via INTRAVENOUS

## 2024-07-10 MED ORDER — ACETAMINOPHEN 650 MG RE SUPP: 650.0000 mg | RECTAL | Status: DC | PRN

## 2024-07-10 MED ORDER — CLEVIDIPINE BUTYRATE 0.5 MG/ML IV EMUL: 0.0000 mg/h | INTRAVENOUS | Status: DC

## 2024-07-10 MED ORDER — ACETAMINOPHEN 160 MG/5ML PO SOLN: 650.0000 mg | ORAL | Status: DC | PRN

## 2024-07-10 MED ORDER — LABETALOL HCL 5 MG/ML IV SOLN
INTRAVENOUS | Status: AC
  Filled 2024-07-10: qty 4

## 2024-07-10 NOTE — Consult Note (Signed)
 NEUROLOGY CONSULT NOTE   Date of service: July 10, 2024 Patient Name: Angela Bentley MRN:  [id number] DOB:  [birth year] Chief Complaint: left-sided weakness, left facial droop Requesting Provider: Sallyann Normie HERO, MD  History of Present Illness  Angela Bentley is a 83 y.o. female with hx of DVT 2016 s/p Xarelto  therapy, HTN, GERD, PAF on Eliquis, DM2 with peripheral neuropathy, left intraventricular meningioma s/p gamma knife treatment, B12 deficiency, advanced dementia with functional decline, anxiety, multiple falls (reported over 4 falls within a 6 hour time window last December, per family thinks she had a stroke at this time) who presented to the ED for evaluation of acute onset of left-sided weakness and slurred speech.  Patient reportedly got up this morning and had breakfast in her usual state of health and took all of her morning medications without incident.  Staff is working with the patient when she had an episode of bile emesis and then had left-sided facial droop and slurred speech with left-sided weakness prompting emergency services activation.  EMS noted significant improvement in symptoms from initial point of contact to ER arrival.  At baseline, patient lives at a skilled nursing facility and is bedbound or wheelchair-bound unable to transfer herself.  Patient is lifted out of the bed using Hoyer lift with left > right lower extremity weakness as well as some reported weakness of bilateral arms.  She is unable to bathe herself and at times unable to feed herself.  Patient's husband does note that over the last 6 to 8 months that she tends to slump towards the left and assumes that she had a stroke last December due to falling 4 times within 6 hours and intermittent left-sided weakness/slumping since.  LKW: 09:30 Modified rankin score: 4-Needs assistance to walk and tend to bodily needs IV Thrombolysis: No, patient on Eliquis at baseline with last dose this AM with breakfast EVT: No,  no LVO  NIHSS on arrival 10:33 NIHSS components Score: Comment  1a Level of Conscious 0[x]  1[]  2[]  3[]      1b LOC Questions 0[]  1[]  2[x]     Baseline advanced dementia  1c LOC Commands 0[x]  1[]  2[]       2 Best Gaze 0[x]  1[]  2[]       3 Visual 0[x]  1[]  2[]  3[]      4 Facial Palsy 0[]  1[x]  2[]  3[]      5a Motor Arm - left 0[x]  1[]  2[]  3[]  4[]  UN[]    5b Motor Arm - Right 0[x]  1[]  2[]  3[]  4[]  UN[]    6a Motor Leg - Left 0[]  1[]  2[x]  3[]  4[]  UN[]  baseline  6b Motor Leg - Right 0[]  1[]  2[x]  3[]  4[]  UN[]  baseline  7 Limb Ataxia 0[x]  1[]  2[]  UN[]      8 Sensory 0[x]  1[]  2[]  UN[]      9 Best Language 0[x]  1[]  2[]  3[]      10 Dysarthria 0[x]  1[]  2[]  UN[]      11 Extinct. and Inattention 0[x]  1[]  2[]       TOTAL: 7     Repeat NIHSS 11:00 NIHSS components Score: Comment  1a Level of Conscious 0[x]  1[]  2[]  3[]      1b LOC Questions 0[]  1[]  2[x]     Baseline advanced dementia  1c LOC Commands 0[]  1[]  2[x]       2 Best Gaze 0[]  1[x]  2[]       3 Visual 0[]  1[]  2[x]  3[]      4 Facial Palsy 0[]  1[]  2[x]  3[]      5a Motor Arm - left 0[]   1[]  2[]  3[x]  4[]  UN[]    5b Motor Arm - Right 0[x]  1[]  2[]  3[]  4[]  UN[]    6a Motor Leg - Left 0[]  1[]  2[]  3[x]  4[]  UN[]    6b Motor Leg - Right 0[]  1[]  2[x]  3[]  4[]  UN[]    7 Limb Ataxia 0[x]  1[]  2[]  UN[]      8 Sensory 0[]  1[x]  2[]  UN[]      9 Best Language 0[]  1[x]  2[]  3[]      10 Dysarthria 0[]  1[x]  2[]  UN[]      11 Extinct. and Inattention 0[]  1[x]  2[]       TOTAL: 21     ROS  Comprehensive ROS performed and pertinent positives documented in HPI  Past History   Past Medical History:  Diagnosis Date   Actinic keratosis 06/25/2020   L pretibia distal - bx proven    Balance problems    BCC (basal cell carcinoma of skin) 12/17/2020   right temple, Moh's 01/19/2021   Brain tumor (HCC)    a. Left intraventricular tumor ->stable by 06/2017 MRI. Followed @ [facility].   Dysplastic nevus 06/25/2020   R flank - moderate   Gait disturbance    a. Unsteady on feet/balance difficulty.    Generalized osteoarthritis of multiple sites    GERD (gastroesophageal reflux disease)    History of DVT (deep vein thrombosis) 2016   estrogen and airflights. Rx with xarelto  for 3 months   History of stress test    a. 11/2016 ETT: No ST/T changes.  Performed to assess PAC/PVC burden with activity ->No ectopy noted.   Hypertension    Hyperthyroidism    treated briefly in college   Insomnia    Obesity    PAF (paroxysmal atrial fibrillation) (HCC)    a. 12/2016 Event Monitor: brief run of PAF-->CHA2DS2VASc = 5--> Xarelto .   Plantar fasciitis    Retinal tear of left eye    Rosacea    SCC (squamous cell carcinoma) 08/12/2021   left chest treated with ED& C 04/06/22   Squamous cell carcinoma of skin 09/07/2017   R dorsum of hand    Type 2 diabetes, controlled, with peripheral neuropathy (HCC)    a. 11/2016 A1c 7.4.   Uterine fibroid    Past Surgical History:  Procedure Laterality Date   CARPAL TUNNEL RELEASE Right    Dr Sissy   CATARACT EXTRACTION     Gamma knife  07/2020   intraventricular mass---meningioma or choroid plexus papilloma   RETINAL TEAR REPAIR CRYOTHERAPY     10/09/13, then again 3/15   Family History: Family History  Problem Relation Age of Onset   Diabetes Father    Pancreatic cancer Father    Diabetes Other        grandmother   Social History  reports that she quit smoking about 30 years ago. Her smoking use included cigarettes. She has never used smokeless tobacco. She reports current alcohol use of about 1.0 standard drink of alcohol per week. She reports that she does not use drugs.  Allergies  Allergen Reactions   Penicillins Other (See Comments)    Unknown reaction   Sulfa Antibiotics Itching and Rash   Zithromax [Azithromycin] Palpitations   Medications   Current Facility-Administered Medications:    [START ON 07/11/2024]  stroke: early stages of recovery book, , Does not apply, Once, Darly Massi M, MD   acetaminophen (TYLENOL) tablet 650 mg, 650  mg, Oral, Q4H PRN **OR** acetaminophen (TYLENOL) 160 MG/5ML solution 650 mg, 650 mg, Per Tube, Q4H PRN **  OR** acetaminophen (TYLENOL) suppository 650 mg, 650 mg, Rectal, Q4H PRN, Gale Klar M, MD   labetalol (NORMODYNE) injection 20 mg, 20 mg, Intravenous, Once **AND** clevidipine (CLEVIPREX) infusion 0.5 mg/mL, 0-21 mg/hr, Intravenous, Continuous, Daanish Copes M, MD   pantoprazole (PROTONIX) injection 40 mg, 40 mg, Intravenous, QHS, Glendine Swetz M, MD   senna-docusate (Senokot-S) tablet 1 tablet, 1 tablet, Oral, BID, Deke Tilghman M, MD   sodium chloride flush (NS) 0.9 % injection 3 mL, 3 mL, Intravenous, Once, Haviland, Julie, MD  Current Outpatient Medications:    acetaminophen (TYLENOL) 325 MG tablet, Take 650 mg by mouth 3 (three) times daily. max of 3000mg  of acetaminophen per 24 hours, from all sources, Disp: , Rfl:    apixaban (ELIQUIS) 5 MG TABS tablet, Take 5 mg by mouth 2 (two) times daily., Disp: , Rfl:    divalproex (DEPAKOTE) 125 MG DR tablet, Take 125 mg by mouth 2 (two) times daily., Disp: , Rfl:    empagliflozin  (JARDIANCE ) 25 MG TABS tablet, Take 1 tablet (25 mg total) by mouth daily before breakfast. (Patient taking differently: Take 25 mg by mouth daily.), Disp: , Rfl:    glimepiride  (AMARYL ) 2 MG tablet, Take 2 mg by mouth daily., Disp: , Rfl:    hydrocortisone (ANUSOL-HC) 2.5 % rectal cream, Place 1 Application rectally See admin instructions. Apply to hemorrhoids topically every 6 hours as needed for hemorrhoids after bowel movement., Disp: , Rfl:    losartan  (COZAAR ) 100 MG tablet, Take 100 mg by mouth daily., Disp: , Rfl:    metoprolol  succinate (TOPROL -XL) 25 MG 24 hr tablet, Take 1 tablet (25 mg total) by mouth daily., Disp: 90 tablet, Rfl: 1   mirtazapine (REMERON) 15 MG tablet, Take 15 mg by mouth at bedtime., Disp: , Rfl:    Multiple Vitamins-Minerals (MULTIVITAMIN WITH MINERALS) tablet, Take 1 tablet by mouth daily., Disp: , Rfl:    Nutritional Supplements (NUTRITIONAL DRINK) LIQD,  Take 1 Container by mouth 3 (three) times daily. Med[employer], Disp: , Rfl:    polyethylene glycol (MIRALAX / GLYCOLAX) 17 g packet, Take 17 g by mouth at bedtime., Disp: , Rfl:    sitaGLIPtin (JANUVIA) 100 MG tablet, Take 100 mg by mouth daily., Disp: , Rfl:    traZODone  (DESYREL ) 50 MG tablet, Take 25 mg by mouth at bedtime., Disp: , Rfl:   Vitals   Vitals:   07/10/24 1000 07/10/24 1115  BP:  (!) 146/64  Pulse:  95  Resp:  17  SpO2:  100%  Weight: 76.4 kg     Body mass index is 27.19 kg/m.  Physical Exam   Constitutional: Appears well-developed and well-nourished.  In no acute distress. Psych: Affect appropriate to situation.  Pleasantly confused, cooperative with exam. Eyes: No scleral injection.  Wears eyeglasses. HENT: No OP obstruction.  Head: Normocephalic.  Cardiovascular: Normal rate and regular rhythm.  Respiratory: Effort normal, non-labored breathing on room air GI: Soft.  No distension. There is no tenderness.  Skin: WDI.   Neurologic Examination   Mental Status: Patient is awake, alert, oriented to self.  She is able to identify her husband at the bedside.  She is disoriented to place, month, year as per baseline advanced dementia. Patient is unable to give a clear and coherent history. Initially, patient has no signs of aphasia or dysarthria.  On reexamination, patient is significantly dysarthric with some aphasia as well in addition to perseveration in response to orientation questions.  She is able to follow some simple  commands with repeat instruction and coaching.  She is unable to perform multistep commands.  Cranial Nerves: II: Visual Fields are full. Pupils are equal, round, and reactive to light.  With fluctuating (worsening) neurologic status, she does not blink to threat on the left and is inconsistent on the right.  III,IV, VI: EOMI without ptosis initially, with fluctuating mental status, the patient has a right gaze preference without ability to cross  midline towards the left V: Facial sensation is intact and symmetric to light touch VII: Initially, patient has mild left facial droop with more significant facial droop during neurologic fluctuation. VIII: Hearing is intact to voice X: Phonation is intact XI: Shoulder shrug is symmetric. XII: Tongue protrudes midline Motor: Tone is normal. Bulk is normal.  Initially patient is able to elevate bilateral upper extremities antigravity without vertical drift with some antigravity movement of bilateral lower extremities per baseline.  With worsening fluctuation, patient has significant left upper and lower extremity weakness and is unable to move antigravity though the left upper extremity maintains some tone throughout.  Sensory: Intact and symmetric bilaterally initially.  With neurologic fluctuation, patient appears to attend to the right with tactile stimulation greater than the left. Cerebellar: Patient does not perform as she is unable to follow multi-step commands Gait: Not assessed, she is wheelchair/bed bound at baseline  Labs/Imaging/Neurodiagnostic studies  CBC:  Recent Labs  Lab 07/10/24 1039 07/10/24 1041  WBC 11.2*  --   NEUTROABS 9.7*  --   HGB 13.1 13.3  HCT 39.7 39.0  MCV 87.8  --   PLT 219  --    Basic Metabolic Panel:  Lab Results  Component Value Date   NA 142 07/10/2024   K 4.2 07/10/2024   CO2 28 (A) 02/15/2024   GLUCOSE 144 (H) 07/10/2024   BUN 15 07/10/2024   CREATININE 0.70 07/10/2024   CALCIUM 8.7 02/15/2024   GFRNONAA >60 09/09/2023   GFRAA >60 03/24/2019   Lipid Panel:  Lab Results  Component Value Date   LDLCALC 88 02/13/2023   HgbA1c:  Lab Results  Component Value Date   HGBA1C 7.3 04/25/2024   Urine Drug Screen: No results found for: LABOPIA, COCAINSCRNUR, LABBENZ, AMPHETMU, THCU, LABBARB  Alcohol Level No results found for: [facility] INR  Lab Results  Component Value Date   INR 1.1 07/10/2024   APTT  Lab Results   Component Value Date   APTT 31 07/10/2024   CT Head without contrast(Personally reviewed): 1. New mass in the left parietal lobe measuring approximately 2.6 x 2.6 x 2.1cm. Correlation with an MRI of the brain without and with gadolinium contrast is recommended. 2. Stable ovoid calcified lesion in the trigone of the left lateral ventricle measuring approximately 2.8 x 2.8 x 3.4 cm. 3. Chronic encephalomalacia changes within the left temporal lobe. 4. Moderate periventricular white matter disease. 5. ASPECT score: 10.  CT angio Head and Neck with contrast(Personally reviewed): 1. No large vessel occlusion, hemodynamically significant stenosis, or aneurysm in the head or neck. 2. Mass within the posteromedial aspect of the left parietal lobe and calcified mass in the trigone of the left lateral ventricle, as seen on prior CT head dated 09/09/23. 3. Findings communicated to Dr. Sallyann at 11:26 am on 07/10/24.  Neurodiagnostics rEEG ordered, pending   ASSESSMENT   Angela Bentley is a 83 y.o. female with a PMHx of DVT s/p Xarelto , PAF on Eliquis, DM2 with peripheral neuropathy, GERD, HTN, left intraventricular meningioma s/p gamma knife treatment, B12 deficiency,  advanced dementia with functional decline, anxiety, frequent falls presenting from her SNF today as a Code Stroke with witnessed onset of left-sided weakness, left facial droop, and slurred speech.   - Exam on arrival revealed patient with some mild residual left mouth droop with improvement in symptoms en route per EMS.  - CTH obtained showing a new mass in the left parietal lobe along with a stable calcified lesion of the left lateral ventricle.  - Due to neurologic worsening, patient was returned to CT for CTA imaging. CTA obtained without evidence of LVO, hemodynamically significant stenosis, or aneurysm in the head or neck.  - Patient's presentation again improved with provider at bedside with concern that symptoms may be blood  pressure related with symptoms returning with SBP at 130 mmHg. IVF bolus given with improvement in neurologic status though no high-grade stenosis appreciated on CTA imaging.  - Presentation may be related to stuttering stroke symptoms versus possible seizure with Todd's paralysis.  Patient does have some left-sided shivering that is not rhythmic and does not appear concerning for seizure activity when evaluated at bedside though the fluctuating nature of her symptoms is possibly concerning for seizure.  RECOMMENDATIONS  - EEG adult - Avoid hypotension, prefer SBP > 140 mmHg with permissive hypertension, treat SBP >210 mmHg ______________________________________________________________________  Signed,  Stevi W Toberman, NP [facility]  NEUROHOSPITALIST ADDENDUM Performed a face to face diagnostic evaluation.   I have reviewed the contents of history and physical exam as documented by PA/ARNP/Resident and agree with above documentation.   Upon re-examination, patient did not have recurrence of symptoms when sat up and with drop in blood pressure as before. She was noted to have increased tone in LUE & LLE with R gaze preference, consistent with seizure. The left sided weakness noted at [facility] possibly Todd's paralysis. Husband reported about 2 month history of patient slumping to her left side and falling asleep. Possibly post-ictal episodes. Discussed with patient's POA daughter Nat over phone and with husband and son at bedside decision to start Depakote. Loaded with 30mg /kg and started on maintenance of 10mg /kg/day divided BID. Decided against Keppra due to agitation.  Kawika Bischoff, MD [facility]

## 2024-07-10 NOTE — ED Notes (Signed)
 [facility] made aware patient needs a ride to her facility. Pt placed on [facility] list

## 2024-07-10 NOTE — ED Notes (Signed)
 Attempted to contact [facility] but was unsuccessful, gave report to [facility].

## 2024-07-10 NOTE — Code Documentation (Signed)
 Stroke Response Nurse Documentation Code Documentation  Angela Bentley is a 83 y.o. female arriving to [facility]  via [facility] on 10/15 with past medical hx of htn, dementia, dm, meningioma, afib on Eliquis (apixaban) daily. Code stroke was activated by EMS.   Patient from SNF where she was LKW at 0930 at which time staff noticed her to vomit and then have L facial droop, L sided weakness, and slurred speech.   Stroke team at the bedside on patient arrival. Labs drawn and patient cleared for CT by Dr. Dean. Patient to CT with team. NIHSS 5, see documentation for details and code stroke times. Patient with disoriented, left facial droop, and right leg weakness on exam. The following imaging was completed:  CT Head. Patient taken back to the room and as she became hypotensive, she had a neuro change, developing worsening L facial droop, R gaze preference, L sided weakness, and aphasia. Taken back to CT for CTA. Symptoms resolving as blood pressure improves. Patient is not a candidate for IV Thrombolytic due to Eliquis. Patient is not a candidate for IR due to no LVO.   Care Plan: q2 NIHSS and vitals. Permissive hypertension-NS bolus and maintenance to support this. Swallow screen prior to PO meds.    Bedside handoff with ED RN Cat.    Angela Bentley  Stroke Response RN

## 2024-07-10 NOTE — Progress Notes (Deleted)
 Location:  Other [place].  Nursing Home Room Number: [facility] [id number] Place of Service:  SNF [phone]) Angela An, NP  PCP: Abdul Fine, MD  Patient Care Team: Abdul Fine, MD as PCP - General (Family Medicine) [name], MD as PCP - Cardiology (Cardiology) [name], MD as Consulting Physician (Ophthalmology)  Extended Emergency Contact Information Primary Emergency Contact: Angela Bentley Address: [address]          [place] [address] [place] Home Phone: [phone] Relation: Spouse Secondary Emergency Contact: Angela Bentley Mobile Phone: [phone] Relation: Daughter Preferred language: English  Goals of care: Advanced Directive information    06/06/2024   12:16 PM  Advanced Directives  Does Patient Have a Medical Advance Directive? Yes  Type of Advance Directive Living will;Out of facility DNR (pink MOST or yellow form)  Does patient want to make changes to medical advance directive? No - Patient declined     Chief Complaint  Patient presents with   Stroke Symptoms    Stroke Symptoms.     HPI:  Pt is a 83 y.o. female seen today for Bentley acute visit for Stroke Symptoms.    Past Medical History:  Diagnosis Date   Actinic keratosis 06/25/2020   L pretibia distal - bx proven    Balance problems    BCC (basal cell carcinoma of skin) 12/17/2020   right temple, Moh's 01/19/2021   Brain tumor (HCC)    a. Left intraventricular tumor ->stable by 06/2017 MRI. Followed @ [facility].   Dysplastic nevus 06/25/2020   R flank - moderate   Gait disturbance    a. Unsteady on feet/balance difficulty.   Generalized osteoarthritis of multiple sites    GERD (gastroesophageal reflux disease)    History of DVT (deep vein thrombosis) 2016   estrogen and airflights. Rx with xarelto  for 3 months   History of stress test    a. 11/2016 ETT: No ST/T changes.  Performed to assess PAC/PVC burden with activity ->No ectopy noted.    Hypertension    Hyperthyroidism    treated briefly in college   Insomnia    Obesity    PAF (paroxysmal atrial fibrillation) (HCC)    a. 12/2016 Event Monitor: brief run of PAF-->CHA2DS2VASc = 5--> Xarelto .   Plantar fasciitis    Retinal tear of left eye    Rosacea    SCC (squamous cell carcinoma) 08/12/2021   left chest treated with ED& C 04/06/22   Squamous cell carcinoma of skin 09/07/2017   R dorsum of hand    Type 2 diabetes, controlled, with peripheral neuropathy (HCC)    a. 11/2016 A1c 7.4.   Uterine fibroid    Past Surgical History:  Procedure Laterality Date   CARPAL TUNNEL RELEASE Right    Dr Sissy   CATARACT EXTRACTION     Gamma knife  07/2020   intraventricular mass---meningioma or choroid plexus papilloma   RETINAL TEAR REPAIR CRYOTHERAPY     10/09/13, then again 3/15    Allergies  Allergen Reactions   Penicillins     Has patient had a PCN reaction causing immediate rash, facial/tongue/throat swelling, SOB or lightheadedness with hypotension: Yes Has patient had a PCN reaction causing severe rash involving mucus membranes or skin necrosis: Yes Has patient had a PCN reaction that required hospitalization No Has patient had a PCN reaction occurring within the last 10 years: No If all of the above answers are NO, then may proceed with Cephalosporin use.  Sulfa Antibiotics     Rash/itching   Azithromycin Palpitations    Outpatient Encounter Medications as of 07/10/2024  Medication Sig   acetaminophen (TYLENOL) 325 MG tablet Take 650 mg by mouth 3 (three) times daily.   apixaban (ELIQUIS) 5 MG TABS tablet Take 5 mg by mouth 2 (two) times daily.   divalproex (DEPAKOTE) 125 MG DR tablet Take 125 mg by mouth 2 (two) times daily.   empagliflozin  (JARDIANCE ) 25 MG TABS tablet Take 1 tablet (25 mg total) by mouth daily before breakfast.   glimepiride  (AMARYL ) 2 MG tablet Take 2 mg by mouth daily.   hydrocortisone (ANUSOL-HC) 2.5 % rectal cream Place 1  Application rectally every 6 (six) hours as needed for hemorrhoids or anal itching.   losartan  (COZAAR ) 100 MG tablet Take 100 mg by mouth daily.   metoprolol  succinate (TOPROL -XL) 25 MG 24 hr tablet Take 1 tablet (25 mg total) by mouth daily.   mirtazapine (REMERON) 15 MG tablet Take 15 mg by mouth at bedtime.   Multiple Vitamin (MULTIVITAMIN) tablet Take 1 tablet by mouth daily.   polyethylene glycol (MIRALAX / GLYCOLAX) 17 g packet Take 17 g by mouth daily.   sitaGLIPtin (JANUVIA) 100 MG tablet Take 100 mg by mouth daily.   traZODone  (DESYREL ) 50 MG tablet Take 50 mg by mouth at bedtime.   divalproex (DEPAKOTE) 125 MG DR tablet Take 125 mg by mouth daily. (Patient not taking: Reported on 07/10/2024)   No facility-administered encounter medications on file as of 07/10/2024.    Review of Systems***  Immunization History  Administered Date(s) Administered   INFLUENZA, HIGH DOSE SEASONAL PF 07/09/2014, 07/23/2015, 06/02/2016, 07/02/2019, 07/13/2022   Influenza,inj,Quad PF,6+ Mos 06/23/2017, 07/17/2018   Influenza-Unspecified 07/23/2015, 06/22/2020, 07/08/2021, 07/20/2023, 07/09/2024   Moderna Covid-19 Fall Seasonal Vaccine 74yrs & older 01/12/2023, 01/05/2024   Moderna Covid-19 Vaccine  Bivalent Booster 98yrs & up 02/22/2022   Moderna Sars-Covid-2 Vaccination 10/08/2019, 11/05/2019, 07/24/2020, 02/28/2021, 08/05/2022   Pfizer Covid-19 Vaccine Bivalent Booster 28yrs & up 06/17/2021   Pneumococcal Conjugate-13 01/30/2014   Pneumococcal Polysaccharide-23 09/13/2004, 11/03/2016   RSV,unspecified 09/13/2023   Tdap 10/23/2012, 02/08/2023   Unspecified SARS-COV-2 Vaccination 06/23/2023   Zoster Recombinant(Shingrix) 11/19/2018, 04/11/2019   Zoster, Live 10/19/2006   Pertinent  Health Maintenance Due  Topic Date Due   HEMOGLOBIN A1C  10/26/2024   OPHTHALMOLOGY EXAM  12/13/2024   FOOT EXAM  05/17/2025   Influenza Vaccine  Completed   DEXA SCAN  Completed   Mammogram  Discontinued       10/14/2020   10:47 AM 10/29/2021    9:41 AM 08/03/2022    1:10 PM 02/28/2023    9:50 AM 03/19/2024    3:36 PM  Fall Risk  Falls in the past year? 0 0 1 0 1  Was there Bentley injury with Fall?   0 0 0  Fall Risk Category Calculator   2 0 2  Fall Risk Category (Retired)   Moderate     (RETIRED) Patient Fall Risk Level   Moderate fall risk     Patient at Risk for Falls Due to   History of fall(s)  History of fall(s);Impaired balance/gait;Impaired mobility  Fall risk Follow up   Falls evaluation completed   Falls evaluation completed     Data saved with a previous flowsheet row definition   Functional Status Survey:    Vitals:   07/10/24 0950  BP: 121/78  Pulse: 77  Resp: 18  Temp: (!) 97.3 F (36.3 C)  SpO2: 94%  Weight: 166 lb 12.8 oz (75.7 kg)  Height: 5' 6 (1.676 m)   Body mass index is 26.92 kg/m. Physical Exam***  Labs reviewed: Recent Labs    09/07/23 0800 09/09/23 1522 09/11/23 0000 12/14/23 0000 01/29/24 0635 02/15/24 0000  NA 142 135   < > 142 143 142  K 3.9 3.5   < > 3.8 3.8 3.6  CL 104 97*   < > 101 105 105  CO2 30 27   < > 32* 29 28*  GLUCOSE 159* 323*  --   --  113*  --   BUN 17 14   < > 18 12 12   CREATININE 0.80 0.91   < > 1.1 0.79 0.7  CALCIUM 9.1 9.0   < > 8.9 8.8 8.7   < > = values in this interval not displayed.   Recent Labs    09/07/23 0800 09/09/23 1522 12/14/23 0000 02/15/24 0000  AST 11 41 22 17  ALT 11 24 37* 20  ALKPHOS  --  63 76 79  BILITOT 0.5 0.4  --   --   PROT 6.2 7.4  --   --   ALBUMIN  --  4.2 3.4* 3.6   Recent Labs    09/07/23 0800 09/09/23 1522 09/11/23 0000 01/29/24 0635 02/15/24 0000 03/19/24 0000  WBC 6.9 9.1   < > 8.9 8.2 9.4  NEUTROABS 4,982 7.0   < > 7,102 6,207.00 7,285.00  HGB 11.5* 12.3   < > 11.1* 11.1* 11.3*  HCT 35.2 36.6   < > 33.5* 34* 35*  MCV 89.1 88.2  --  86.8  --   --   PLT 203 247   < > 222 206 226   < > = values in this interval not displayed.   Lab Results  Component Value Date   TSH 2.930  04/04/2023   Lab Results  Component Value Date   HGBA1C 7.3 04/25/2024   Lab Results  Component Value Date   CHOL 167 02/13/2023   HDL 43 (L) 02/13/2023   LDLCALC 88 02/13/2023   LDLDIRECT 104.0 10/29/2021   TRIG 302 (H) 02/13/2023   CHOLHDL 3.9 02/13/2023    Significant Diagnostic Results in last 30 days:  No results found.  Assessment/Plan No problem-specific Assessment & Plan notes found for this encounter.    Angela Bentley K. Caro BODILY [facility] [phone]

## 2024-07-10 NOTE — Progress Notes (Signed)
 Location:  Other Nursing Home Room Number: [facility] [id number] Place of Service:  SNF (31)  Abdul Fine, MD  Patient Care Team: Abdul Fine, MD as PCP - General (Family Medicine) [name], MD as PCP - Cardiology (Cardiology) [name], MD as Consulting Physician (Ophthalmology)  Extended Emergency Contact Information Primary Emergency Contact: Shreeve,Fred Address: [address]          [place] [address] [place] Home Phone: [phone] Relation: Spouse Secondary Emergency Contact: Jones,Nancy Mobile Phone: [phone] Relation: Daughter Preferred language: English  Goals of care: Advanced Directive information    06/06/2024   12:16 PM  Advanced Directives  Does Patient Have a Medical Advance Directive? Yes  Type of Advance Directive Living will;Out of facility DNR (pink MOST or yellow form)  Does patient want to make changes to medical advance directive? No - Patient declined     Chief Complaint  Patient presents with   Stroke Symptoms    Stroke Symptoms.     HPI:  Pt is a 83 y.o. female seen today for an acute visit for stoke like symptoms Pt with hx of DM, a fib, hx of meningioma with gamma knife treatment. She has advanced dementia with functional decline. Over the past few months she has had issues with leaning to the right side wand head droop, thought to possibly have been from a CVA or the increase in tumor growth. Due to lack of treatment options due to age and comorbidities imaging was no pursued. Today staff was working with her and she vomiting bile like substance and then noticed left sided facial droop, slurred speech along with left sided weakness.  Daughter called and would like her to be sent to ED for further work up.     Past Medical History:  Diagnosis Date   Actinic keratosis 06/25/2020   L pretibia distal - bx proven    Balance problems    BCC (basal cell carcinoma of skin) 12/17/2020   right  temple, Moh's 01/19/2021   Brain tumor (HCC)    a. Left intraventricular tumor ->stable by 06/2017 MRI. Followed @ [facility].   Dysplastic nevus 06/25/2020   R flank - moderate   Gait disturbance    a. Unsteady on feet/balance difficulty.   Generalized osteoarthritis of multiple sites    GERD (gastroesophageal reflux disease)    History of DVT (deep vein thrombosis) 2016   estrogen and airflights. Rx with xarelto  for 3 months   History of stress test    a. 11/2016 ETT: No ST/T changes.  Performed to assess PAC/PVC burden with activity ->No ectopy noted.   Hypertension    Hyperthyroidism    treated briefly in college   Insomnia    Obesity    PAF (paroxysmal atrial fibrillation) (HCC)    a. 12/2016 Event Monitor: brief run of PAF-->CHA2DS2VASc = 5--> Xarelto .   Plantar fasciitis    Retinal tear of left eye    Rosacea    SCC (squamous cell carcinoma) 08/12/2021   left chest treated with ED& C 04/06/22   Squamous cell carcinoma of skin 09/07/2017   R dorsum of hand    Type 2 diabetes, controlled, with peripheral neuropathy (HCC)    a. 11/2016 A1c 7.4.   Uterine fibroid    Past Surgical History:  Procedure Laterality Date   CARPAL TUNNEL RELEASE Right    Dr Sissy   CATARACT EXTRACTION     Gamma knife  07/2020   intraventricular mass---meningioma or  choroid plexus papilloma   RETINAL TEAR REPAIR CRYOTHERAPY     10/09/13, then again 3/15    Allergies  Allergen Reactions   Penicillins     Has patient had a PCN reaction causing immediate rash, facial/tongue/throat swelling, SOB or lightheadedness with hypotension: Yes Has patient had a PCN reaction causing severe rash involving mucus membranes or skin necrosis: Yes Has patient had a PCN reaction that required hospitalization No Has patient had a PCN reaction occurring within the last 10 years: No If all of the above answers are NO, then may proceed with Cephalosporin use.     Sulfa Antibiotics     Rash/itching   Azithromycin  Palpitations    Outpatient Encounter Medications as of 07/10/2024  Medication Sig   acetaminophen (TYLENOL) 325 MG tablet Take 650 mg by mouth 3 (three) times daily.   apixaban (ELIQUIS) 5 MG TABS tablet Take 5 mg by mouth 2 (two) times daily.   divalproex (DEPAKOTE) 125 MG DR tablet Take 125 mg by mouth 2 (two) times daily.   empagliflozin  (JARDIANCE ) 25 MG TABS tablet Take 1 tablet (25 mg total) by mouth daily before breakfast.   glimepiride  (AMARYL ) 2 MG tablet Take 2 mg by mouth daily.   hydrocortisone (ANUSOL-HC) 2.5 % rectal cream Place 1 Application rectally every 6 (six) hours as needed for hemorrhoids or anal itching.   losartan  (COZAAR ) 100 MG tablet Take 100 mg by mouth daily.   metoprolol  succinate (TOPROL -XL) 25 MG 24 hr tablet Take 1 tablet (25 mg total) by mouth daily.   mirtazapine (REMERON) 15 MG tablet Take 15 mg by mouth at bedtime.   Multiple Vitamin (MULTIVITAMIN) tablet Take 1 tablet by mouth daily.   polyethylene glycol (MIRALAX / GLYCOLAX) 17 g packet Take 17 g by mouth daily.   sitaGLIPtin (JANUVIA) 100 MG tablet Take 100 mg by mouth daily.   traZODone  (DESYREL ) 50 MG tablet Take 50 mg by mouth at bedtime.   divalproex (DEPAKOTE) 125 MG DR tablet Take 125 mg by mouth daily. (Patient not taking: Reported on 07/10/2024)   No facility-administered encounter medications on file as of 07/10/2024.    Review of Systems  Unable to perform ROS: Dementia    Immunization History  Administered Date(s) Administered   INFLUENZA, HIGH DOSE SEASONAL PF 07/09/2014, 07/23/2015, 06/02/2016, 07/02/2019, 07/13/2022   Influenza,inj,Quad PF,6+ Mos 06/23/2017, 07/17/2018   Influenza-Unspecified 07/23/2015, 06/22/2020, 07/08/2021, 07/20/2023, 07/09/2024   Moderna Covid-19 Fall Seasonal Vaccine 5yrs & older 01/12/2023, 01/05/2024   Moderna Covid-19 Vaccine  Bivalent Booster 34yrs & up 02/22/2022   Moderna Sars-Covid-2 Vaccination 10/08/2019, 11/05/2019, 07/24/2020, 02/28/2021,  08/05/2022   Pfizer Covid-19 Vaccine Bivalent Booster 2yrs & up 06/17/2021   Pneumococcal Conjugate-13 01/30/2014   Pneumococcal Polysaccharide-23 09/13/2004, 11/03/2016   RSV,unspecified 09/13/2023   Tdap 10/23/2012, 02/08/2023   Unspecified SARS-COV-2 Vaccination 06/23/2023   Zoster Recombinant(Shingrix) 11/19/2018, 04/11/2019   Zoster, Live 10/19/2006   Pertinent  Health Maintenance Due  Topic Date Due   HEMOGLOBIN A1C  10/26/2024   OPHTHALMOLOGY EXAM  12/13/2024   FOOT EXAM  05/17/2025   Influenza Vaccine  Completed   DEXA SCAN  Completed   Mammogram  Discontinued      10/14/2020   10:47 AM 10/29/2021    9:41 AM 08/03/2022    1:10 PM 02/28/2023    9:50 AM 03/19/2024    3:36 PM  Fall Risk  Falls in the past year? 0 0 1 0 1  Was there an injury with Fall?   0  0 0  Fall Risk Category Calculator   2 0 2  Fall Risk Category (Retired)   Moderate     (RETIRED) Patient Fall Risk Level   Moderate fall risk     Patient at Risk for Falls Due to   History of fall(s)  History of fall(s);Impaired balance/gait;Impaired mobility  Fall risk Follow up   Falls evaluation completed   Falls evaluation completed     Data saved with a previous flowsheet row definition   Functional Status Survey:    Vitals:   07/10/24 0950  BP: 121/78  Pulse: 77  Resp: 18  Temp: (!) 97.3 F (36.3 C)  SpO2: 94%  Weight: 166 lb 12.8 oz (75.7 kg)  Height: 5' 6 (1.676 m)   Body mass index is 26.92 kg/m. Physical Exam Constitutional:      General: She is not in acute distress.    Appearance: She is well-developed. She is not diaphoretic.  HENT:     Mouth/Throat:     Pharynx: No oropharyngeal exudate.  Eyes:     Conjunctiva/sclera: Conjunctivae normal.     Pupils: Pupils are equal, round, and reactive to light.  Cardiovascular:     Rate and Rhythm: Normal rate and regular rhythm.     Heart sounds: Normal heart sounds.  Pulmonary:     Effort: Pulmonary effort is normal.     Breath sounds: Normal  breath sounds.  Abdominal:     General: Bowel sounds are normal.     Palpations: Abdomen is soft.  Musculoskeletal:     Cervical back: Normal range of motion and neck supple.     Right lower leg: No edema.     Left lower leg: No edema.  Skin:    General: Skin is warm and dry.  Neurological:     Mental Status: She is alert. She is disoriented.     Motor: Weakness present.     Comments: Left sided facial drop Left sided weakness -squeezes hand very lightly   Psychiatric:        Mood and Affect: Mood normal.     Labs reviewed: Recent Labs    09/07/23 0800 09/09/23 1522 09/11/23 0000 12/14/23 0000 01/29/24 0635 02/15/24 0000  NA 142 135   < > 142 143 142  K 3.9 3.5   < > 3.8 3.8 3.6  CL 104 97*   < > 101 105 105  CO2 30 27   < > 32* 29 28*  GLUCOSE 159* 323*  --   --  113*  --   BUN 17 14   < > 18 12 12   CREATININE 0.80 0.91   < > 1.1 0.79 0.7  CALCIUM 9.1 9.0   < > 8.9 8.8 8.7   < > = values in this interval not displayed.   Recent Labs    09/07/23 0800 09/09/23 1522 12/14/23 0000 02/15/24 0000  AST 11 41 22 17  ALT 11 24 37* 20  ALKPHOS  --  63 76 79  BILITOT 0.5 0.4  --   --   PROT 6.2 7.4  --   --   ALBUMIN  --  4.2 3.4* 3.6   Recent Labs    09/07/23 0800 09/09/23 1522 09/11/23 0000 01/29/24 0635 02/15/24 0000 03/19/24 0000  WBC 6.9 9.1   < > 8.9 8.2 9.4  NEUTROABS 4,982 7.0   < > 7,102 6,207.00 7,285.00  HGB 11.5* 12.3   < > 11.1* 11.1* 11.3*  HCT 35.2 36.6   < > 33.5* 34* 35*  MCV 89.1 88.2  --  86.8  --   --   PLT 203 247   < > 222 206 226   < > = values in this interval not displayed.   Lab Results  Component Value Date   TSH 2.930 04/04/2023   Lab Results  Component Value Date   HGBA1C 7.3 04/25/2024   Lab Results  Component Value Date   CHOL 167 02/13/2023   HDL 43 (L) 02/13/2023   LDLCALC 88 02/13/2023   LDLDIRECT 104.0 10/29/2021   TRIG 302 (H) 02/13/2023   CHOLHDL 3.9 02/13/2023    Significant Diagnostic Results in last 30  days:  No results found.  Assessment/Plan 1. Stroke-like symptoms (Primary) New onset facial droop and left sided weakness with slurred speech witness by staff.  Daughter would like her to be sent to ED for further evaluation at this time  2. Meningioma (HCC) Hx of meningioma with untreated tumors, could have increase in ICP or increased in size.   3. Moderate dementia with anxiety, unspecified dementia type (HCC) Progressive dementia, relies on staff for ADLs  4. PAF (paroxysmal atrial fibrillation) (HCC) Rate controlled on metoprolol  and continues on eliquis BID. No missed or held doses      Shyana Kulakowski K. Caro BODILY [facility] [phone]

## 2024-07-10 NOTE — Procedures (Addendum)
 Patient Name: Angela Bentley  MRN: [id number]  Epilepsy Attending: Arlin MALVA Krebs  Referring Physician/Provider: Sallyann Normie HERO, MD  Date: 07/10/2024 Duration: 28.10 mins  Patient history: 83 year old female with left-sided weakness and slurred speech.  EEG to evaluate for seizure.  Level of alertness: Awake  AEDs during EEG study: None  Technical aspects: This EEG study was done with scalp electrodes positioned according to the 10-20 International system of electrode placement. Electrical activity was reviewed with band pass filter of 1-70Hz , sensitivity of 7 uV/mm, display speed of 36mm/sec with a 60Hz  notched filter applied as appropriate. EEG data were recorded continuously and digitally stored.  Video monitoring was available and reviewed as appropriate.  Description: No clear posterior dominant rhythm was seen.  EEG showed continuous generalized predominantly 5 to 7 Hz theta slowing admixed with intermittent 2 to 3 Hz delta slowing.  Hyperventilation and photic stimulation were not performed.     Of note, EEG was technically difficult due to significant myogenic and movement artifact.  ABNORMALITY - Continuous slow, generalized  IMPRESSION: This technically difficult study is suggestive of generalized cerebral dysfunction (encephalopathy). No seizures or epileptiform discharges were seen throughout the recording.  Kila Godina O Audie Stayer

## 2024-07-10 NOTE — ED Provider Notes (Addendum)
 [facility] AT [facility] Provider Note   CSN: [id number] Arrival date & time: 07/10/24  8966  An emergency department physician performed an initial assessment on this suspected stroke patient at 1035.  Patient presents with: Code Stroke   Angela Bentley is a 83 y.o. female.   Pt is a 83 yo female with pmhx significant for htn, dementia, dm, htn, oa, meningioma s/p gamma knife tx, afib (on Eliquis).  Today, she woke up and ate breakfast like normal.  Staff then saw her have a left sided facial droop, left sided weakness, and slurred speech with vomiting at 0930.  EMS was called and sx have improved en route.  Code stroke called by EMS.  The stroke team and myself met pt at the EMS bridge.       Prior to Admission medications   Medication Sig Start Date End Date Taking? Authorizing Provider  acetaminophen (TYLENOL) 325 MG tablet Take 650 mg by mouth 3 (three) times daily. max of 3000mg  of acetaminophen per 24 hours, from all sources   Yes [provider]  apixaban (ELIQUIS) 5 MG TABS tablet Take 5 mg by mouth 2 (two) times daily.   Yes [provider]  divalproex (DEPAKOTE) 125 MG DR tablet Take 3 tablets (375 mg total) by mouth 2 (two) times daily. 07/10/24  Yes Dean Clarity, MD  empagliflozin  (JARDIANCE ) 25 MG TABS tablet Take 1 tablet (25 mg total) by mouth daily before breakfast. Patient taking differently: Take 25 mg by mouth daily. 09/12/23  Yes Beamer, Richerd, MD  glimepiride  (AMARYL ) 2 MG tablet Take 2 mg by mouth daily.   Yes [provider]  hydrocortisone (ANUSOL-HC) 2.5 % rectal cream Place 1 Application rectally See admin instructions. Apply to hemorrhoids topically every 6 hours as needed for hemorrhoids after bowel movement.   Yes [provider]  losartan  (COZAAR ) 100 MG tablet Take 100 mg by mouth daily.   Yes [provider]  metoprolol  succinate (TOPROL -XL) 25 MG 24 hr tablet Take 1  tablet (25 mg total) by mouth daily. 09/22/22  Yes Abdul Richerd, MD  mirtazapine (REMERON) 15 MG tablet Take 15 mg by mouth at bedtime.   Yes [provider]  Multiple Vitamins-Minerals (MULTIVITAMIN WITH MINERALS) tablet Take 1 tablet by mouth daily.   Yes [provider]  Nutritional Supplements (NUTRITIONAL DRINK) LIQD Take 1 Container by mouth 3 (three) times daily. [employer]   Yes [provider]  polyethylene glycol (MIRALAX / GLYCOLAX) 17 g packet Take 17 g by mouth at bedtime.   Yes [provider]  sitaGLIPtin (JANUVIA) 100 MG tablet Take 100 mg by mouth daily.   Yes [provider]  traZODone  (DESYREL ) 50 MG tablet Take 25 mg by mouth at bedtime.   Yes [provider]    Allergies: Penicillins, Sulfa antibiotics, and Zithromax [azithromycin]    Review of Systems  Neurological:  Positive for facial asymmetry, speech difficulty and weakness.  All other systems reviewed and are negative.   Updated Vital Signs BP (!) 160/88   Pulse 94   Resp 14   Wt 76.4 kg   LMP 09/26/1997   SpO2 98%   BMI 27.19 kg/m   Physical Exam Vitals and nursing note reviewed.  Constitutional:      Appearance: Normal appearance.  HENT:     Head: Normocephalic and atraumatic.     Right Ear: External ear normal.     Left Ear: External ear normal.  Nose: Nose normal.     Mouth/Throat:     Mouth: Mucous membranes are moist.     Pharynx: Oropharynx is clear.  Eyes:     Extraocular Movements: Extraocular movements intact.     Conjunctiva/sclera: Conjunctivae normal.     Pupils: Pupils are equal, round, and reactive to light.  Cardiovascular:     Rate and Rhythm: Normal rate and regular rhythm.     Pulses: Normal pulses.     Heart sounds: Normal heart sounds.  Pulmonary:     Effort: Pulmonary effort is normal.     Breath sounds: Normal breath sounds.  Abdominal:     General: Abdomen is flat. Bowel sounds are normal.     Palpations:  Abdomen is soft.  Musculoskeletal:        General: Normal range of motion.     Cervical back: Normal range of motion and neck supple.  Skin:    General: Skin is warm.     Capillary Refill: Capillary refill takes less than 2 seconds.  Neurological:     Mental Status: She is alert. She is disoriented.     Comments: Mild residual left arm weakness  Psychiatric:        Mood and Affect: Mood normal.     (all labs ordered are listed, but only abnormal results are displayed) Labs Reviewed  CBC - Abnormal; Notable for the following components:      Result Value   WBC 11.2 (*)    All other components within normal limits  DIFFERENTIAL - Abnormal; Notable for the following components:   Neutro Abs 9.7 (*)    All other components within normal limits  COMPREHENSIVE METABOLIC PANEL WITH GFR - Abnormal; Notable for the following components:   Glucose, Bld 149 (*)    ALT 45 (*)    All other components within normal limits  I-STAT CHEM 8, ED - Abnormal; Notable for the following components:   Glucose, Bld 144 (*)    Calcium, Ion 1.14 (*)    All other components within normal limits  CBG MONITORING, ED - Abnormal; Notable for the following components:   Glucose-Capillary 140 (*)    All other components within normal limits  PROTIME-INR  APTT  ETHANOL    EKG: None  Radiology: EEG adult Result Date: 07/10/2024 Shelton Arlin KIDD, MD     07/10/2024  1:18 PM Patient Name: Angela Bentley MRN: [id number] Epilepsy Attending: Arlin KIDD Shelton Referring Physician/Provider: Sallyann Normie HERO, MD Date: 07/10/2024 Duration: 28.10 mins Patient history: 83 year old female with left-sided weakness and slurred speech.  EEG to evaluate for seizure. Level of alertness: Awake AEDs during EEG study: None Technical aspects: This EEG study was done with scalp electrodes positioned according to the 10-20 International system of electrode placement. Electrical activity was reviewed with band pass filter of 1-70Hz ,  sensitivity of 7 uV/mm, display speed of 41mm/sec with a 60Hz  notched filter applied as appropriate. EEG data were recorded continuously and digitally stored.  Video monitoring was available and reviewed as appropriate. Description: No clear posterior dominant rhythm was seen.  EEG showed continuous generalized predominantly 5 to 7 Hz theta slowing admixed with intermittent 2 to 3 Hz delta slowing.  Hyperventilation and photic stimulation were not performed.   Of note, EEG was technically difficult due to significant myogenic and movement artifact. ABNORMALITY - Continuous slow, generalized IMPRESSION: This technically difficult study is suggestive of generalized cerebral dysfunction (encephalopathy). No seizures or epileptiform discharges were seen throughout the recording.  Angela Bentley   CT ANGIO HEAD NECK W WO CM (CODE STROKE) Result Date: 07/10/2024 EXAM: CTA HEAD AND NECK WITH AND WITHOUT 07/10/2024 11:13:00 AM TECHNIQUE: CTA of the head and neck was performed with and without the administration of 75 mL of iohexol (OMNIPAQUE) 350 MG/ML injection. Multiplanar 2D and/or 3D reformatted images are provided for review. Automated exposure control, iterative reconstruction, and/or weight based adjustment of the mA/kV was utilized to reduce the radiation dose to as low as reasonably achievable. Stenosis of the internal carotid arteries measured using NASCET criteria. COMPARISON: CT of the head dated 07/10/2024 and 09/09/2023. CLINICAL HISTORY: Neuro deficit, acute, stroke suspected. 75ml omni 350 IV. Code stroke. Dr. Sallyann [phone] Transient left sided weakness, left facial droop, vomiting. FINDINGS: CTA NECK: AORTIC ARCH AND ARCH VESSELS: No dissection or arterial injury. No significant stenosis of the brachiocephalic or subclavian arteries. CERVICAL CAROTID ARTERIES: No dissection, arterial injury, or hemodynamically significant stenosis by NASCET criteria. CERVICAL VERTEBRAL ARTERIES: No dissection,  arterial injury, or significant stenosis. LUNGS AND MEDIASTINUM: Unremarkable. SOFT TISSUES: No acute abnormality. BONES: No acute abnormality. CTA HEAD: ANTERIOR CIRCULATION: No significant stenosis of the internal carotid arteries. No significant stenosis of the anterior cerebral arteries. No significant stenosis of the middle cerebral arteries. No aneurysm. POSTERIOR CIRCULATION: No significant stenosis of the posterior cerebral arteries. No significant stenosis of the basilar artery. No significant stenosis of the vertebral arteries. No aneurysm. OTHER: Findings remain concerning her mass within the posteromedial aspect of the left parietal lobe. A calcified masses again noted in the trigone of the left lateral ventricle. No dural venous sinus thrombosis on this non-dedicated study. The above findings were communicated to Dr. Sallyann at 11:26 am 07/10/2024. IMPRESSION: 1. No large vessel occlusion, hemodynamically significant stenosis, or aneurysm in the head or neck. 2. Mass within the posteromedial aspect of the left parietal lobe and calcified mass in the trigone of the left lateral ventricle, as seen on prior CT head dated 09/09/23. 3. Findings communicated to Dr. Sallyann at 11:26 am on 07/10/24. Electronically signed by: Evalene Coho MD 07/10/2024 11:31 AM EDT RP Workstation: [id number]   CT HEAD CODE STROKE WO CONTRAST Result Date: 07/10/2024 EXAM: CT HEAD WITHOUT CONTRAST 07/10/2024 10:42:04 AM TECHNIQUE: CT of the head was performed without the administration of intravenous contrast. Automated exposure control, iterative reconstruction, and/or weight based adjustment of the mA/kV was utilized to reduce the radiation dose to as low as reasonably achievable. COMPARISON: CT of the head dated 09/09/2023. CLINICAL HISTORY: Neuro deficit, acute, stroke suspected. Code stroke. Dr. Sallyann [phone]; Transient left sided weakness, left facial droop, vomiting. FINDINGS: BRAIN AND VENTRICLES: No acute hemorrhage.  New hypodense attenuation mass present immediately within the left parietal lobe on image 20 of series 2, measuring approximately 2.6 x 2.6 x 2.1 cm. Ovoid calcified lesion demonstrated within the trigone of the left lateral ventricle, measuring approximately 2.8 x 2.8 x 3.4 cm, which appears to be stable in the interim. There are chronic encephalomalacia changes within the left temporal lobe. There is moderate periventricular white matter disease. No hydrocephalus. No extra-axial collection. No mass effect or midline shift. [place] stroke program early CT (ASPECT) score: Ganglionic (caudate, IC, lentiform nucleus, insula, M1-M3): 7 Supraganglionic (M4-M6): 3 Total: 10 ORBITS: No acute abnormality. SINUSES: No acute abnormality. SOFT TISSUES AND SKULL: No acute soft tissue abnormality. No skull fracture. The above findings were communicated to Dr. Sallyann at 10:49 AM on 07/10/2024. IMPRESSION: 1. New mass in the left parietal lobe measuring  approximately 2.6 x 2.6 x 2.1 cm. Correlation with an MRI of the brain without and with gadolinium contrast is recommended. 2. Stable ovoid calcified lesion in the trigone of the left lateral ventricle measuring approximately 2.8 x 2.8 x 3.4 cm. 3. Chronic encephalomalacia changes within the left temporal lobe. 4. Moderate periventricular white matter disease. 5. ASPECT score: 10. Electronically signed by: Evalene Coho MD 07/10/2024 10:56 AM EDT RP Workstation: [id number]     Procedures   Medications Ordered in the ED  sodium chloride flush (NS) 0.9 % injection 3 mL (3 mLs Intravenous Not Given 07/10/24 1245)  valproate (DEPACON) 2,250 mg in dextrose 5 % 50 mL IVPB (has no administration in time range)  iohexol (OMNIPAQUE) 350 MG/ML injection 75 mL (75 mLs Intravenous Contrast Given 07/10/24 1115)                                    Medical Decision Making Amount and/or Complexity of Data Reviewed Labs: ordered. Radiology: ordered.  Risk Decision regarding  hospitalization.   This patient presents to the ED for concern of cva, this involves an extensive number of treatment options, and is a complaint that carries with it a high risk of complications and morbidity.  The differential diagnosis includes cva, tumor, infection, tia   Co morbidities that complicate the patient evaluation  htn, dementia, dm, htn, oa, meningioma s/p gamma knife tx, afib (on Eliquis)   Additional history obtained:  Additional history obtained from epic chart review External records from outside source obtained and reviewed including EMS report   Lab Tests:  I Ordered, and personally interpreted labs.  The pertinent results include:  cbc with wbc sl elevated at 11.2, inr nl   Imaging Studies ordered:  I ordered imaging studies including ct head, cta head/neck  I independently visualized and interpreted imaging which showed  CT head:  New mass in the left parietal lobe measuring approximately 2.6 x 2.6 x 2.1  cm. Correlation with an MRI of the brain without and with gadolinium contrast  is recommended.  2. Stable ovoid calcified lesion in the trigone of the left lateral ventricle  measuring approximately 2.8 x 2.8 x 3.4 cm.  3. Chronic encephalomalacia changes within the left temporal lobe.  4. Moderate periventricular white matter disease.  5. ASPECT score: 10.  CTA head/neck: . No large vessel occlusion, hemodynamically significant stenosis, or aneurysm  in the head or neck.  2. Mass within the posteromedial aspect of the left parietal lobe and calcified  mass in the trigone of the left lateral ventricle, as seen on prior CT head  dated 09/09/23.  3. Findings communicated to Dr. Sallyann at 11:26 am on 07/10/24.   I agree with the radiologist interpretation   Cardiac Monitoring:  The patient was maintained on a cardiac monitor.  I personally viewed and interpreted the cardiac monitored which showed an underlying rhythm of: nsr   Medicines ordered  and prescription drug management:   I have reviewed the patients home medicines and have made adjustments as needed   Test Considered:  ct   Critical Interventions:  Code stroke   Consultations Obtained:  I requested consultation with the neurologist (Dr. Sallyann),  and discussed lab and imaging findings as well as pertinent plan -   Problem List / ED Course:  AMS:  initially, we thought calcified area in head was blood, but it looks like it's her  tumor.  No tnk due to eliquis.  No IR due to no LVO.  Pt has been having waxing and waning sx, so Dr. Sallyann is concerned it may be a seizure causing sx.  She has ordered EEG.  Pt has not had any seizures, but pt could not tolerate the EEG machine very well.  Dr. Darliss did order depakote for her and has talked to the family about goals of care.  Family does not want MRI and pt will be able to be d/c with depakote (10 mgkg/day divided bid)  Reevaluation:  After the interventions noted above, I reevaluated the patient and found that they have :stayed the same   Social Determinants of Health:  Lives in memory care   Dispostion:  After consideration of the diagnostic results and the patients response to treatment, I feel that the patent would benefit from discharge with outpatient f/u  CRITICAL CARE Performed by: Mliss Boyers   Total critical care time: 30 minutes  Critical care time was exclusive of separately billable procedures and treating other patients.  Critical care was necessary to treat or prevent imminent or life-threatening deterioration.  Critical care was time spent personally by me on the following activities: development of treatment plan with patient and/or surrogate as well as nursing, discussions with consultants, evaluation of patient's response to treatment, examination of patient, obtaining history from patient or surrogate, ordering and performing treatments and interventions, ordering and review of laboratory  studies, ordering and review of radiographic studies, pulse oximetry and re-evaluation of patient's condition.        Final diagnoses:  Left-sided weakness  Seizure [facility])    ED Discharge Orders          Ordered    divalproex (DEPAKOTE) 125 MG DR tablet  2 times daily        07/10/24 1534               Boyers Mliss, MD 07/10/24 1311    Boyers Mliss, MD 07/10/24 1535

## 2024-07-10 NOTE — ED Triage Notes (Signed)
 Pt bibems from [facility]. Pt was up eating breakfast and getting meds in her chair around 0800 today. LKW 0930. L sided weakness, L facial droop, and slurred speech reported.  18G in RAC BP 170/90 HR 90 CBG 130

## 2024-07-10 NOTE — Progress Notes (Signed)
 EEG complete - results pending

## 2024-07-10 NOTE — ED Notes (Signed)
 Check tube station and depacon IV not on unit

## 2024-07-11 ENCOUNTER — Encounter: Payer: Self-pay | Admitting: Nurse Practitioner

## 2024-07-11 ENCOUNTER — Non-Acute Institutional Stay (SKILLED_NURSING_FACILITY): Payer: Self-pay | Admitting: Nurse Practitioner

## 2024-07-11 DIAGNOSIS — E440 Moderate protein-calorie malnutrition: Secondary | ICD-10-CM

## 2024-07-11 DIAGNOSIS — I480 Paroxysmal atrial fibrillation: Secondary | ICD-10-CM

## 2024-07-11 DIAGNOSIS — R569 Unspecified convulsions: Secondary | ICD-10-CM | POA: Diagnosis not present

## 2024-07-11 DIAGNOSIS — I10 Essential (primary) hypertension: Secondary | ICD-10-CM

## 2024-07-11 DIAGNOSIS — F03B4 Unspecified dementia, moderate, with anxiety: Secondary | ICD-10-CM | POA: Diagnosis not present

## 2024-07-11 DIAGNOSIS — D329 Benign neoplasm of meninges, unspecified: Secondary | ICD-10-CM | POA: Diagnosis not present

## 2024-07-11 NOTE — Progress Notes (Signed)
 Location:  Other [facility].  Nursing Home Room Number: [facility] [id number] Place of Service:  SNF [phone]) [name], NP  Abdul Fine, MD  Patient Care Team: Abdul Fine, MD as PCP - General (Family Medicine) [name], MD as PCP - Cardiology (Cardiology) [name], MD as Consulting Physician (Ophthalmology)  Extended Emergency Contact Information Primary Emergency Contact: Bentley,Angela Address: [address]          [place] [address] [place] Home Phone: [phone] Relation: Spouse Secondary Emergency Contact: Bentley,Angela Mobile Phone: [phone] Relation: Daughter Preferred language: English  Goals of care: Advanced Directive information    06/06/2024   12:16 PM  Advanced Directives  Does Patient Have a Medical Advance Directive? Yes  Type of Advance Directive Living will;Out of facility DNR (pink MOST or yellow form)  Does patient want to make changes to medical advance directive? No - Patient declined     Chief Complaint  Patient presents with   Hospitalization Follow-up    ER Follow up    HPI:  Pt is a 83 y.o. female seen today for ER Follow up. Pt went to the ED yesterday with a code stroke. pmhx significant for htn, dementia, dm, htn, oa, meningioma s/p gamma knife tx, afib (on Eliquis). Staff then saw her have a left sided facial droop, left sided weakness, and slurred speech with vomiting. EMS called code stroke. CT of the head completed and revealed new mass. Symptoms were on and off during ED visit and thought could be related to seizures. She could not tolerate EEG due to dementia. goals of care were discussed with family in ER and family agreed for medication for seizures without further imaging to work up- as pt was having a hard time tolerating and has hx of advanced dementia. She was sent back to [facility].  She is doing well today- back to baseline at this time.  Daughter called during visit  today. Nursing has no concerns.    Past Medical History:  Diagnosis Date   Actinic keratosis 06/25/2020   L pretibia distal - bx proven    Balance problems    BCC (basal cell carcinoma of skin) 12/17/2020   right temple, Moh's 01/19/2021   Brain tumor (HCC)    a. Left intraventricular tumor ->stable by 06/2017 MRI. Followed @ [facility].   Dysplastic nevus 06/25/2020   R flank - moderate   Gait disturbance    a. Unsteady on feet/balance difficulty.   Generalized osteoarthritis of multiple sites    GERD (gastroesophageal reflux disease)    History of DVT (deep vein thrombosis) 2016   estrogen and airflights. Rx with xarelto  for 3 months   History of stress test    a. 11/2016 ETT: No ST/T changes.  Performed to assess PAC/PVC burden with activity ->No ectopy noted.   Hypertension    Hyperthyroidism    treated briefly in college   Insomnia    Obesity    PAF (paroxysmal atrial fibrillation) (HCC)    a. 12/2016 Event Monitor: brief run of PAF-->CHA2DS2VASc = 5--> Xarelto .   Plantar fasciitis    Retinal tear of left eye    Rosacea    SCC (squamous cell carcinoma) 08/12/2021   left chest treated with ED& C 04/06/22   Squamous cell carcinoma of skin 09/07/2017   R dorsum of hand    Type 2 diabetes, controlled, with peripheral neuropathy (HCC)    a. 11/2016 A1c 7.4.   Uterine fibroid  Past Surgical History:  Procedure Laterality Date   CARPAL TUNNEL RELEASE Right    Dr Sissy   CATARACT EXTRACTION     Gamma knife  07/2020   intraventricular mass---meningioma or choroid plexus papilloma   RETINAL TEAR REPAIR CRYOTHERAPY     10/09/13, then again 3/15    Allergies  Allergen Reactions   Penicillins Other (See Comments)    Unknown reaction   Sulfa Antibiotics Itching and Rash   Zithromax [Azithromycin] Palpitations    Outpatient Encounter Medications as of 07/11/2024  Medication Sig   acetaminophen (TYLENOL) 325 MG tablet Take 650 mg by mouth 3 (three) times daily. max of  3000mg  of acetaminophen per 24 hours, from all sources   apixaban (ELIQUIS) 5 MG TABS tablet Take 5 mg by mouth 2 (two) times daily.   divalproex (DEPAKOTE) 125 MG DR tablet Take 125 mg by mouth 2 (two) times daily.   empagliflozin  (JARDIANCE ) 25 MG TABS tablet Take 1 tablet (25 mg total) by mouth daily before breakfast.   glimepiride  (AMARYL ) 2 MG tablet Take 2 mg by mouth daily.   hydrocortisone (ANUSOL-HC) 2.5 % rectal cream Place 1 Application rectally See admin instructions. Apply to hemorrhoids topically every 6 hours as needed for hemorrhoids after bowel movement.   losartan  (COZAAR ) 100 MG tablet Take 100 mg by mouth daily.   metoprolol  succinate (TOPROL -XL) 25 MG 24 hr tablet Take 1 tablet (25 mg total) by mouth daily.   mirtazapine (REMERON) 15 MG tablet Take 15 mg by mouth at bedtime.   Multiple Vitamins-Minerals (MULTIVITAMIN WITH MINERALS) tablet Take 1 tablet by mouth daily.   Nutritional Supplements (NUTRITIONAL DRINK) LIQD Take 1 Container by mouth 3 (three) times daily. Med[employer]   polyethylene glycol (MIRALAX / GLYCOLAX) 17 g packet Take 17 g by mouth at bedtime.   sitaGLIPtin (JANUVIA) 100 MG tablet Take 100 mg by mouth daily.   traZODone  (DESYREL ) 50 MG tablet Take 25 mg by mouth at bedtime.   divalproex (DEPAKOTE) 125 MG DR tablet Take 3 tablets (375 mg total) by mouth 2 (two) times daily. (Patient not taking: Reported on 07/11/2024)   No facility-administered encounter medications on file as of 07/11/2024.    Review of Systems  Unable to perform ROS: Dementia     Immunization History  Administered Date(s) Administered   INFLUENZA, HIGH DOSE SEASONAL PF 07/09/2014, 07/23/2015, 06/02/2016, 07/02/2019, 07/13/2022   Influenza,inj,Quad PF,6+ Mos 06/23/2017, 07/17/2018   Influenza-Unspecified 07/23/2015, 06/22/2020, 07/08/2021, 07/20/2023, 07/09/2024   Moderna Covid-19 Fall Seasonal Vaccine 48yrs & older 01/12/2023, 01/05/2024   Moderna Covid-19 Vaccine  Bivalent Booster  59yrs & up 02/22/2022   Moderna Sars-Covid-2 Vaccination 10/08/2019, 11/05/2019, 07/24/2020, 02/28/2021, 08/05/2022   Pfizer Covid-19 Vaccine Bivalent Booster 73yrs & up 06/17/2021   Pneumococcal Conjugate-13 01/30/2014   Pneumococcal Polysaccharide-23 09/13/2004, 11/03/2016   RSV,unspecified 09/13/2023   Tdap 10/23/2012, 02/08/2023   Unspecified SARS-COV-2 Vaccination 06/23/2023   Zoster Recombinant(Shingrix) 11/19/2018, 04/11/2019   Zoster, Live 10/19/2006   Pertinent  Health Maintenance Due  Topic Date Due   HEMOGLOBIN A1C  10/26/2024   OPHTHALMOLOGY EXAM  12/13/2024   FOOT EXAM  05/17/2025   Influenza Vaccine  Completed   DEXA SCAN  Completed   Mammogram  Discontinued      10/14/2020   10:47 AM 10/29/2021    9:41 AM 08/03/2022    1:10 PM 02/28/2023    9:50 AM 03/19/2024    3:36 PM  Fall Risk  Falls in the past year? 0 0 1 0 1  Was there an injury with Fall?   0 0 0  Fall Risk Category Calculator   2 0 2  Fall Risk Category (Retired)   Moderate     (RETIRED) Patient Fall Risk Level   Moderate fall risk     Patient at Risk for Falls Due to   History of fall(s)  History of fall(s);Impaired balance/gait;Impaired mobility  Fall risk Follow up   Falls evaluation completed   Falls evaluation completed     Data saved with a previous flowsheet row definition   Functional Status Survey:    Vitals:   07/11/24 1054  BP: 121/78  Pulse: 77  Resp: 18  Temp: (!) 97.4 F (36.3 C)  SpO2: 94%  Weight: 166 lb 12.8 oz (75.7 kg)  Height: 5' 6 (1.676 m)   Body mass index is 26.92 kg/m.  Wt Readings from Last 3 Encounters:  07/11/24 166 lb 12.8 oz (75.7 kg)  07/10/24 168 lb 6.9 oz (76.4 kg)  07/10/24 166 lb 12.8 oz (75.7 kg)    Physical Exam Constitutional:      General: She is not in acute distress.    Appearance: She is well-developed. She is not diaphoretic.  HENT:     Head: Normocephalic and atraumatic.     Mouth/Throat:     Pharynx: No oropharyngeal exudate.  Eyes:      Conjunctiva/sclera: Conjunctivae normal.     Pupils: Pupils are equal, round, and reactive to light.  Cardiovascular:     Rate and Rhythm: Normal rate and regular rhythm.     Heart sounds: Normal heart sounds.  Pulmonary:     Effort: Pulmonary effort is normal.     Breath sounds: Normal breath sounds.  Abdominal:     General: Bowel sounds are normal.     Palpations: Abdomen is soft.  Musculoskeletal:     Cervical back: Normal range of motion and neck supple.     Right lower leg: No edema.     Left lower leg: No edema.  Skin:    General: Skin is warm and dry.  Neurological:     Mental Status: She is alert. She is disoriented.     Motor: Weakness present.     Gait: Gait abnormal.  Psychiatric:        Mood and Affect: Mood normal.     Labs reviewed: Recent Labs    01/29/24 0635 02/15/24 0000 07/10/24 1039 07/10/24 1041  NA 143 142 141 142  K 3.8 3.6 4.0 4.2  CL 105 105 103 103  CO2 29 28* 25  --   GLUCOSE 113*  --  149* 144*  BUN 12 12 12 15   CREATININE 0.79 0.7 0.67 0.70  CALCIUM 8.8 8.7 8.9  --    Recent Labs    09/07/23 0800 09/07/23 0800 09/09/23 1522 12/14/23 0000 02/15/24 0000 07/10/24 1039  AST 11  --  41 22 17 28   ALT 11  --  24 37* 20 45*  ALKPHOS  --    < > 63 76 79 85  BILITOT 0.5  --  0.4  --   --  0.8  PROT 6.2  --  7.4  --   --  6.9  ALBUMIN  --    < > 4.2 3.4* 3.6 3.6   < > = values in this interval not displayed.   Recent Labs    09/09/23 1522 09/11/23 0000 01/29/24 0635 02/15/24 0000 03/19/24 0000 07/10/24 1039 07/10/24 1041  WBC  9.1   < > 8.9 8.2 9.4 11.2*  --   NEUTROABS 7.0   < > 7,102 6,207.00 7,285.00 9.7*  --   HGB 12.3   < > 11.1* 11.1* 11.3* 13.1 13.3  HCT 36.6   < > 33.5* 34* 35* 39.7 39.0  MCV 88.2  --  86.8  --   --  87.8  --   PLT 247   < > 222 206 226 219  --    < > = values in this interval not displayed.   Lab Results  Component Value Date   TSH 2.930 04/04/2023   Lab Results  Component Value Date   HGBA1C  7.3 04/25/2024   Lab Results  Component Value Date   CHOL 167 02/13/2023   HDL 43 (L) 02/13/2023   LDLCALC 88 02/13/2023   LDLDIRECT 104.0 10/29/2021   TRIG 302 (H) 02/13/2023   CHOLHDL 3.9 02/13/2023    Significant Diagnostic Results in last 30 days:  EEG adult Result Date: 07/10/2024 Shelton Arlin KIDD, MD     07/10/2024  1:18 PM Patient Name: Angela Bentley MRN: [id number] Epilepsy Attending: Arlin KIDD Shelton Referring Physician/Provider: Sallyann Normie HERO, MD Date: 07/10/2024 Duration: 28.10 mins Patient history: 83 year old female with left-sided weakness and slurred speech.  EEG to evaluate for seizure. Level of alertness: Awake AEDs during EEG study: None Technical aspects: This EEG study was done with scalp electrodes positioned according to the 10-20 International system of electrode placement. Electrical activity was reviewed with band pass filter of 1-70Hz , sensitivity of 7 uV/mm, display speed of 59mm/sec with a 60Hz  notched filter applied as appropriate. EEG data were recorded continuously and digitally stored.  Video monitoring was available and reviewed as appropriate. Description: No clear posterior dominant rhythm was seen.  EEG showed continuous generalized predominantly 5 to 7 Hz theta slowing admixed with intermittent 2 to 3 Hz delta slowing.  Hyperventilation and photic stimulation were not performed.   Of note, EEG was technically difficult due to significant myogenic and movement artifact. ABNORMALITY - Continuous slow, generalized IMPRESSION: This technically difficult study is suggestive of generalized cerebral dysfunction (encephalopathy). No seizures or epileptiform discharges were seen throughout the recording. Priyanka KIDD Shelton   CT ANGIO HEAD NECK W WO CM (CODE STROKE) Result Date: 07/10/2024 EXAM: CTA HEAD AND NECK WITH AND WITHOUT 07/10/2024 11:13:00 AM TECHNIQUE: CTA of the head and neck was performed with and without the administration of 75 mL of iohexol (OMNIPAQUE) 350  MG/ML injection. Multiplanar 2D and/or 3D reformatted images are provided for review. Automated exposure control, iterative reconstruction, and/or weight based adjustment of the mA/kV was utilized to reduce the radiation dose to as low as reasonably achievable. Stenosis of the internal carotid arteries measured using NASCET criteria. COMPARISON: CT of the head dated 07/10/2024 and 09/09/2023. CLINICAL HISTORY: Neuro deficit, acute, stroke suspected. 75ml omni 350 IV. Code stroke. Dr. Sallyann [phone] Transient left sided weakness, left facial droop, vomiting. FINDINGS: CTA NECK: AORTIC ARCH AND ARCH VESSELS: No dissection or arterial injury. No significant stenosis of the brachiocephalic or subclavian arteries. CERVICAL CAROTID ARTERIES: No dissection, arterial injury, or hemodynamically significant stenosis by NASCET criteria. CERVICAL VERTEBRAL ARTERIES: No dissection, arterial injury, or significant stenosis. LUNGS AND MEDIASTINUM: Unremarkable. SOFT TISSUES: No acute abnormality. BONES: No acute abnormality. CTA HEAD: ANTERIOR CIRCULATION: No significant stenosis of the internal carotid arteries. No significant stenosis of the anterior cerebral arteries. No significant stenosis of the middle cerebral arteries. No aneurysm. POSTERIOR CIRCULATION: No significant stenosis  of the posterior cerebral arteries. No significant stenosis of the basilar artery. No significant stenosis of the vertebral arteries. No aneurysm. OTHER: Findings remain concerning her mass within the posteromedial aspect of the left parietal lobe. A calcified masses again noted in the trigone of the left lateral ventricle. No dural venous sinus thrombosis on this non-dedicated study. The above findings were communicated to Dr. Sallyann at 11:26 am 07/10/2024. IMPRESSION: 1. No large vessel occlusion, hemodynamically significant stenosis, or aneurysm in the head or neck. 2. Mass within the posteromedial aspect of the left parietal lobe and calcified  mass in the trigone of the left lateral ventricle, as seen on prior CT head dated 09/09/23. 3. Findings communicated to Dr. Sallyann at 11:26 am on 07/10/24. Electronically signed by: Evalene Coho MD 07/10/2024 11:31 AM EDT RP Workstation: [id number]   CT HEAD CODE STROKE WO CONTRAST Result Date: 07/10/2024 EXAM: CT HEAD WITHOUT CONTRAST 07/10/2024 10:42:04 AM TECHNIQUE: CT of the head was performed without the administration of intravenous contrast. Automated exposure control, iterative reconstruction, and/or weight based adjustment of the mA/kV was utilized to reduce the radiation dose to as low as reasonably achievable. COMPARISON: CT of the head dated 09/09/2023. CLINICAL HISTORY: Neuro deficit, acute, stroke suspected. Code stroke. Dr. Sallyann [phone]; Transient left sided weakness, left facial droop, vomiting. FINDINGS: BRAIN AND VENTRICLES: No acute hemorrhage. New hypodense attenuation mass present immediately within the left parietal lobe on image 20 of series 2, measuring approximately 2.6 x 2.6 x 2.1 cm. Ovoid calcified lesion demonstrated within the trigone of the left lateral ventricle, measuring approximately 2.8 x 2.8 x 3.4 cm, which appears to be stable in the interim. There are chronic encephalomalacia changes within the left temporal lobe. There is moderate periventricular white matter disease. No hydrocephalus. No extra-axial collection. No mass effect or midline shift. [place] stroke program early CT (ASPECT) score: Ganglionic (caudate, IC, lentiform nucleus, insula, M1-M3): 7 Supraganglionic (M4-M6): 3 Total: 10 ORBITS: No acute abnormality. SINUSES: No acute abnormality. SOFT TISSUES AND SKULL: No acute soft tissue abnormality. No skull fracture. The above findings were communicated to Dr. Sallyann at 10:49 AM on 07/10/2024. IMPRESSION: 1. New mass in the left parietal lobe measuring approximately 2.6 x 2.6 x 2.1 cm. Correlation with an MRI of the brain without and with gadolinium contrast is  recommended. 2. Stable ovoid calcified lesion in the trigone of the left lateral ventricle measuring approximately 2.8 x 2.8 x 3.4 cm. 3. Chronic encephalomalacia changes within the left temporal lobe. 4. Moderate periventricular white matter disease. 5. ASPECT score: 10. Electronically signed by: Evalene Coho MD 07/10/2024 10:56 AM EDT RP Workstation: [id number]    Assessment/Plan Seizure (HCC) Back to baseline at this time, depakote increased to 375 mg BID for seizure prevention.   PAF (paroxysmal atrial fibrillation) (HCC) Rate controlled on metoprolol  Continues on eliquis for anticoagulation.    Moderate protein-calorie malnutrition Weight loss noted since August. Continues on supplement however expect further weight loss due to progression of disease.   Moderate dementia with anxiety (HCC) Progressive symptoms noted.continues with supportive care with staff. Familys goal is comfort.  Prefer her to stay in facility as she does not understand reasons and work up requiring hospitalization Only would like her to go to the hospital if pain or discomfort was not able to be treated in facility. No hospitalization order written.   Meningioma [facility]) New lesion noted on imaging however daughter questions if this was not actually already there as she has had 2 noted lesions  in the past. Either way she does not want any additional treatments or workup  Hypertension Blood pressure well controlled, goal bp <140/90 Continue current medications follow metabolic panel     Vernie Vinciguerra K. Caro BODILY [facility] [phone]

## 2024-07-16 DIAGNOSIS — R569 Unspecified convulsions: Secondary | ICD-10-CM | POA: Insufficient documentation

## 2024-07-16 NOTE — Assessment & Plan Note (Signed)
 New lesion noted on imaging however daughter questions if this was not actually already there as she has had 2 noted lesions in the past. Either way she does not want any additional treatments or workup

## 2024-07-16 NOTE — Assessment & Plan Note (Signed)
 Rate controlled on metoprolol  Continues on eliquis for anticoagulation

## 2024-07-16 NOTE — Assessment & Plan Note (Signed)
 Back to baseline at this time, depakote increased to 375 mg BID for seizure prevention.

## 2024-07-16 NOTE — Assessment & Plan Note (Signed)
 Weight loss noted since August. Continues on supplement however expect further weight loss due to progression of disease.

## 2024-07-16 NOTE — Assessment & Plan Note (Signed)
 Blood pressure well controlled, goal bp <140/90 Continue current medications follow metabolic panel

## 2024-07-16 NOTE — Assessment & Plan Note (Signed)
 Progressive symptoms noted.continues with supportive care with staff. Familys goal is comfort.  Prefer her to stay in facility as she does not understand reasons and work up requiring hospitalization Only would like her to go to the hospital if pain or discomfort was not able to be treated in facility. No hospitalization order written.

## 2024-07-17 DIAGNOSIS — E1159 Type 2 diabetes mellitus with other circulatory complications: Secondary | ICD-10-CM | POA: Diagnosis not present

## 2024-07-17 DIAGNOSIS — I10 Essential (primary) hypertension: Secondary | ICD-10-CM | POA: Diagnosis not present

## 2024-07-17 DIAGNOSIS — R2689 Other abnormalities of gait and mobility: Secondary | ICD-10-CM | POA: Diagnosis not present

## 2024-07-17 DIAGNOSIS — B351 Tinea unguium: Secondary | ICD-10-CM | POA: Diagnosis not present

## 2024-07-17 DIAGNOSIS — F03B18 Unspecified dementia, moderate, with other behavioral disturbance: Secondary | ICD-10-CM | POA: Diagnosis not present

## 2024-07-17 DIAGNOSIS — M6281 Muscle weakness (generalized): Secondary | ICD-10-CM | POA: Diagnosis not present

## 2024-07-17 DIAGNOSIS — M179 Osteoarthritis of knee, unspecified: Secondary | ICD-10-CM | POA: Diagnosis not present

## 2024-07-17 DIAGNOSIS — Z741 Need for assistance with personal care: Secondary | ICD-10-CM | POA: Diagnosis not present

## 2024-07-17 DIAGNOSIS — F03B4 Unspecified dementia, moderate, with anxiety: Secondary | ICD-10-CM | POA: Diagnosis not present

## 2024-07-17 DIAGNOSIS — D329 Benign neoplasm of meninges, unspecified: Secondary | ICD-10-CM | POA: Diagnosis not present

## 2024-07-17 DIAGNOSIS — E1142 Type 2 diabetes mellitus with diabetic polyneuropathy: Secondary | ICD-10-CM | POA: Diagnosis not present

## 2024-07-19 DIAGNOSIS — Z23 Encounter for immunization: Secondary | ICD-10-CM | POA: Diagnosis not present

## 2024-07-29 ENCOUNTER — Non-Acute Institutional Stay: Payer: Self-pay | Admitting: Orthopedic Surgery

## 2024-07-29 ENCOUNTER — Encounter: Payer: Self-pay | Admitting: Orthopedic Surgery

## 2024-07-29 DIAGNOSIS — F01B4 Vascular dementia, moderate, with anxiety: Secondary | ICD-10-CM

## 2024-07-29 DIAGNOSIS — Z7984 Long term (current) use of oral hypoglycemic drugs: Secondary | ICD-10-CM | POA: Diagnosis not present

## 2024-07-29 DIAGNOSIS — I10 Essential (primary) hypertension: Secondary | ICD-10-CM

## 2024-07-29 DIAGNOSIS — E1142 Type 2 diabetes mellitus with diabetic polyneuropathy: Secondary | ICD-10-CM | POA: Diagnosis not present

## 2024-07-29 DIAGNOSIS — R634 Abnormal weight loss: Secondary | ICD-10-CM

## 2024-07-29 DIAGNOSIS — E0590 Thyrotoxicosis, unspecified without thyrotoxic crisis or storm: Secondary | ICD-10-CM

## 2024-07-29 DIAGNOSIS — F5105 Insomnia due to other mental disorder: Secondary | ICD-10-CM | POA: Diagnosis not present

## 2024-07-29 DIAGNOSIS — I480 Paroxysmal atrial fibrillation: Secondary | ICD-10-CM | POA: Diagnosis not present

## 2024-07-29 DIAGNOSIS — F99 Mental disorder, not otherwise specified: Secondary | ICD-10-CM | POA: Diagnosis not present

## 2024-07-29 DIAGNOSIS — D329 Benign neoplasm of meninges, unspecified: Secondary | ICD-10-CM | POA: Diagnosis not present

## 2024-07-29 DIAGNOSIS — R569 Unspecified convulsions: Secondary | ICD-10-CM

## 2024-07-29 DIAGNOSIS — E1122 Type 2 diabetes mellitus with diabetic chronic kidney disease: Secondary | ICD-10-CM | POA: Diagnosis not present

## 2024-07-29 LAB — COMPREHENSIVE METABOLIC PANEL WITH GFR
Albumin: 3.5 (ref 3.5–5.0)
Calcium: 8.7 (ref 8.7–10.7)
Globulin: 2.4

## 2024-07-29 LAB — HEMOGLOBIN A1C: Hemoglobin A1C: 6.8

## 2024-07-29 LAB — CBC AND DIFFERENTIAL
HCT: 37 (ref 36–46)
Hemoglobin: 12.1 (ref 12.0–16.0)
Neutrophils Absolute: 5873
Platelets: 204 K/uL (ref 150–400)
WBC: 7.8

## 2024-07-29 LAB — BASIC METABOLIC PANEL WITH GFR
BUN: 18 (ref 4–21)
CO2: 30 — AB (ref 13–22)
Chloride: 105 (ref 99–108)
Creatinine: 0.6 (ref 0.5–1.1)
Glucose: 86
Potassium: 3.7 meq/L (ref 3.5–5.1)
Sodium: 145 (ref 137–147)

## 2024-07-29 LAB — HEPATIC FUNCTION PANEL
ALT: 35 U/L (ref 7–35)
AST: 22 (ref 13–35)
Alkaline Phosphatase: 58 (ref 25–125)
Bilirubin, Total: 0.4

## 2024-07-29 LAB — CBC: RBC: 4.1 (ref 3.87–5.11)

## 2024-07-29 NOTE — Progress Notes (Unsigned)
 Location:  Other [place].  Nursing Home Room Number: [facility] [id number] Place of Service:  SNF [phone]) Provider:  Greig Cluster, NP  Laurence Locus, DO  Patient Care Team: Laurence Locus, DO as PCP - General (Internal Medicine) [name], MD as PCP - Cardiology (Cardiology) [name], MD as Consulting Physician (Ophthalmology)  Extended Emergency Contact Information Primary Emergency Contact: Vert,Fred Address: [address]          [place] [address] [place] Home Phone: [phone] Relation: Spouse Secondary Emergency Contact: Jones,Nancy Mobile Phone: [phone] Relation: Daughter Preferred language: English  Code Status:  DNR Goals of care: Advanced Directive information    06/06/2024   12:16 PM  Advanced Directives  Does Patient Have a Medical Advance Directive? Yes  Type of Advance Directive Living will;Out of facility DNR (pink MOST or yellow form)  Does patient want to make changes to medical advance directive? No - Patient declined     Chief Complaint  Patient presents with   Medical Management of Chronic Issues    Medical Management of Chronic Issues.     HPI:  Pt is a 83 y.o. female seen today for medical management of chronic diseases.    She currently resides on the skilled nursing unit at [facility]. PMH: atherosclerosis, HTN, PAF, T2DM, hyperthyroidism, meningioma, dementia, OA, malnutrition, retinal detachment, seizure and B12 deficiency.   T2DM- A1c 7.3 04/25/2024, sugars 100-200's, eye exam 06/12/2024, remains on Jardiance , Januvia and glimepiride  Meningioma/Seizure- 10/15 noted with left facial droop/slurred speech/weakness> ED evaluation> code stroke> CT head noted tumor, neurology concerned about seizure and recommended starting Depakote, EEG not performed> family declined aggressive workup, no recent seizures  PAF- TSH 2.93007/05/2023, rate controlled with metoprolol , remains on Eliquis for clot prevention Dementia-  SLUMS 13/30 11/07/2022, 06/2024 CT head noted moderate periventricular white matter disease, dependent with ADLs except feeding, no behaviors  HTN- BUN/creat 15/0.70 07/10/2024, remains on losartan  Hyperthyroidism- TSH 2.93007/05/2023, not on medication Weight loss- see trends below, remains on mirtazapine   Recent weights:  10/29- 166.4 lbs  10/01- 164.4 lbs  09/03- 174.6 lbs  Recent blood pressures:  10/29- 116/71, 125/77  10/22- 130/75, 125/76     Past Medical History:  Diagnosis Date   Actinic keratosis 06/25/2020   L pretibia distal - bx proven    Balance problems    BCC (basal cell carcinoma of skin) 12/17/2020   right temple, Moh's 01/19/2021   Brain tumor (HCC)    a. Left intraventricular tumor ->stable by 06/2017 MRI. Followed @ [facility].   Dysplastic nevus 06/25/2020   R flank - moderate   Gait disturbance    a. Unsteady on feet/balance difficulty.   Generalized osteoarthritis of multiple sites    GERD (gastroesophageal reflux disease)    History of DVT (deep vein thrombosis) 2016   estrogen and airflights. Rx with xarelto  for 3 months   History of stress test    a. 11/2016 ETT: No ST/T changes.  Performed to assess PAC/PVC burden with activity ->No ectopy noted.   Hypertension    Hyperthyroidism    treated briefly in college   Insomnia    Obesity    PAF (paroxysmal atrial fibrillation) (HCC)    a. 12/2016 Event Monitor: brief run of PAF-->CHA2DS2VASc = 5--> Xarelto .   Plantar fasciitis    Retinal tear of left eye    Rosacea    SCC (squamous cell carcinoma) 08/12/2021   left chest treated with ED& C 04/06/22   Squamous  cell carcinoma of skin 09/07/2017   R dorsum of hand    Type 2 diabetes, controlled, with peripheral neuropathy (HCC)    a. 11/2016 A1c 7.4.   Uterine fibroid    Past Surgical History:  Procedure Laterality Date   CARPAL TUNNEL RELEASE Right    Dr Sissy   CATARACT EXTRACTION     Gamma knife  07/2020   intraventricular mass---meningioma or  choroid plexus papilloma   RETINAL TEAR REPAIR CRYOTHERAPY     10/09/13, then again 3/15    Allergies  Allergen Reactions   Penicillins Other (See Comments)    Unknown reaction   Sulfa Antibiotics Itching and Rash   Zithromax [Azithromycin] Palpitations    Outpatient Encounter Medications as of 07/29/2024  Medication Sig   acetaminophen (TYLENOL) 325 MG tablet Take 650 mg by mouth 3 (three) times daily. max of 3000mg  of acetaminophen per 24 hours, from all sources   apixaban (ELIQUIS) 5 MG TABS tablet Take 5 mg by mouth 2 (two) times daily.   divalproex (DEPAKOTE) 125 MG DR tablet Take 3 tablets (375 mg total) by mouth 2 (two) times daily.   empagliflozin  (JARDIANCE ) 25 MG TABS tablet Take 1 tablet (25 mg total) by mouth daily before breakfast.   glimepiride  (AMARYL ) 2 MG tablet Take 2 mg by mouth daily.   hydrocortisone (ANUSOL-HC) 2.5 % rectal cream Place 1 Application rectally See admin instructions. Apply to hemorrhoids topically every 6 hours as needed for hemorrhoids after bowel movement.   losartan  (COZAAR ) 100 MG tablet Take 100 mg by mouth daily.   metoprolol  succinate (TOPROL -XL) 25 MG 24 hr tablet Take 1 tablet (25 mg total) by mouth daily.   mirtazapine (REMERON) 15 MG tablet Take 15 mg by mouth at bedtime.   Multiple Vitamins-Minerals (MULTIVITAMIN WITH MINERALS) tablet Take 1 tablet by mouth daily.   Nutritional Supplements (NUTRITIONAL DRINK) LIQD Take 1 Container by mouth 3 (three) times daily. Med[employer]   polyethylene glycol (MIRALAX / GLYCOLAX) 17 g packet Take 17 g by mouth at bedtime.   sitaGLIPtin (JANUVIA) 100 MG tablet Take 100 mg by mouth daily.   traZODone  (DESYREL ) 50 MG tablet Take 25 mg by mouth at bedtime.   divalproex (DEPAKOTE) 125 MG DR tablet Take 125 mg by mouth 2 (two) times daily. (Patient not taking: Reported on 07/29/2024)   No facility-administered encounter medications on file as of 07/29/2024.    Review of Systems  Unable to perform ROS: Dementia     Immunization History  Administered Date(s) Administered   INFLUENZA, HIGH DOSE SEASONAL PF 07/09/2014, 07/23/2015, 06/02/2016, 07/02/2019, 07/13/2022   Influenza,inj,Quad PF,6+ Mos 06/23/2017, 07/17/2018   Influenza-Unspecified 07/23/2015, 06/22/2020, 07/08/2021, 07/20/2023, 07/09/2024   Moderna Covid-19 Fall Seasonal Vaccine 77yrs & older 01/12/2023, 01/05/2024   Moderna Covid-19 Vaccine  Bivalent Booster 32yrs & up 02/22/2022   Moderna Sars-Covid-2 Vaccination 10/08/2019, 11/05/2019, 07/24/2020, 02/28/2021, 08/05/2022   Pfizer Covid-19 Vaccine Bivalent Booster 40yrs & up 06/17/2021   Pneumococcal Conjugate-13 01/30/2014   Pneumococcal Polysaccharide-23 09/13/2004, 11/03/2016   RSV,unspecified 09/13/2023   Tdap 10/23/2012, 02/08/2023   Unspecified SARS-COV-2 Vaccination 06/23/2023, 07/19/2024   Zoster Recombinant(Shingrix) 11/19/2018, 04/11/2019   Zoster, Live 10/19/2006   Pertinent  Health Maintenance Due  Topic Date Due   HEMOGLOBIN A1C  10/26/2024   OPHTHALMOLOGY EXAM  12/13/2024   FOOT EXAM  05/17/2025   Influenza Vaccine  Completed   DEXA SCAN  Completed   Mammogram  Discontinued      10/14/2020   10:47 AM 10/29/2021  9:41 AM 08/03/2022    1:10 PM 02/28/2023    9:50 AM 03/19/2024    3:36 PM  Fall Risk  Falls in the past year? 0 0 1 0 1  Was there an injury with Fall?   0 0 0  Fall Risk Category Calculator   2 0 2  Fall Risk Category (Retired)   Moderate     (RETIRED) Patient Fall Risk Level   Moderate fall risk     Patient at Risk for Falls Due to   History of fall(s)  History of fall(s);Impaired balance/gait;Impaired mobility  Fall risk Follow up   Falls evaluation completed   Falls evaluation completed     Data saved with a previous flowsheet row definition   Functional Status Survey:    Vitals:   07/29/24 1036  BP: 125/77  Pulse: 74  Resp: 18  Temp: 98.5 F (36.9 C)  SpO2: 94%  Weight: 166 lb 6.4 oz (75.5 kg)  Height: 5' 6 (1.676 m)   Body mass  index is 26.86 kg/m. Physical Exam Vitals reviewed.  Constitutional:      General: She is not in acute distress. HENT:     Head: Normocephalic.     Right Ear: There is no impacted cerumen.     Left Ear: There is no impacted cerumen.     Nose: Nose normal.     Mouth/Throat:     Mouth: Mucous membranes are moist.  Eyes:     General:        Right eye: No discharge.        Left eye: No discharge.  Cardiovascular:     Rate and Rhythm: Normal rate and regular rhythm.     Pulses: Normal pulses.     Heart sounds: Normal heart sounds.  Pulmonary:     Effort: Pulmonary effort is normal. No respiratory distress.     Breath sounds: Normal breath sounds. No wheezing or rales.  Abdominal:     General: Bowel sounds are normal. There is no distension.     Palpations: Abdomen is soft.     Tenderness: There is no abdominal tenderness.  Musculoskeletal:     Cervical back: Neck supple.     Right lower leg: No edema.     Left lower leg: No edema.  Skin:    General: Skin is warm.     Capillary Refill: Capillary refill takes less than 2 seconds.  Neurological:     General: No focal deficit present.     Mental Status: She is alert. Mental status is at baseline.     Gait: Gait abnormal.  Psychiatric:        Mood and Affect: Mood normal.     Comments: Very pleasant, alert to self/familiar face, follows some commands     Labs reviewed: Recent Labs    01/29/24 0635 02/15/24 0000 07/10/24 1039 07/10/24 1041  NA 143 142 141 142  K 3.8 3.6 4.0 4.2  CL 105 105 103 103  CO2 29 28* 25  --   GLUCOSE 113*  --  149* 144*  BUN 12 12 12 15   CREATININE 0.79 0.7 0.67 0.70  CALCIUM 8.8 8.7 8.9  --    Recent Labs    09/07/23 0800 09/07/23 0800 09/09/23 1522 12/14/23 0000 02/15/24 0000 07/10/24 1039  AST 11  --  41 22 17 28   ALT 11  --  24 37* 20 45*  ALKPHOS  --    < > 63  76 79 85  BILITOT 0.5  --  0.4  --   --  0.8  PROT 6.2  --  7.4  --   --  6.9  ALBUMIN  --    < > 4.2 3.4* 3.6 3.6    < > = values in this interval not displayed.   Recent Labs    09/09/23 1522 09/11/23 0000 01/29/24 0635 02/15/24 0000 03/19/24 0000 07/10/24 1039 07/10/24 1041  WBC 9.1   < > 8.9 8.2 9.4 11.2*  --   NEUTROABS 7.0   < > 7,102 6,207.00 7,285.00 9.7*  --   HGB 12.3   < > 11.1* 11.1* 11.3* 13.1 13.3  HCT 36.6   < > 33.5* 34* 35* 39.7 39.0  MCV 88.2  --  86.8  --   --  87.8  --   PLT 247   < > 222 206 226 219  --    < > = values in this interval not displayed.   Lab Results  Component Value Date   TSH 2.930 04/04/2023   Lab Results  Component Value Date   HGBA1C 7.3 04/25/2024   Lab Results  Component Value Date   CHOL 167 02/13/2023   HDL 43 (L) 02/13/2023   LDLCALC 88 02/13/2023   LDLDIRECT 104.0 10/29/2021   TRIG 302 (H) 02/13/2023   CHOLHDL 3.9 02/13/2023    Significant Diagnostic Results in last 30 days:  EEG adult Result Date: 07/10/2024 Shelton Arlin KIDD, MD     07/10/2024  1:18 PM Patient Name: Ewa Hipp MRN: [id number] Epilepsy Attending: Arlin KIDD Shelton Referring Physician/Provider: Sallyann Normie HERO, MD Date: 07/10/2024 Duration: 28.10 mins Patient history: 83 year old female with left-sided weakness and slurred speech.  EEG to evaluate for seizure. Level of alertness: Awake AEDs during EEG study: None Technical aspects: This EEG study was done with scalp electrodes positioned according to the 10-20 International system of electrode placement. Electrical activity was reviewed with band pass filter of 1-70Hz , sensitivity of 7 uV/mm, display speed of 51mm/sec with a 60Hz  notched filter applied as appropriate. EEG data were recorded continuously and digitally stored.  Video monitoring was available and reviewed as appropriate. Description: No clear posterior dominant rhythm was seen.  EEG showed continuous generalized predominantly 5 to 7 Hz theta slowing admixed with intermittent 2 to 3 Hz delta slowing.  Hyperventilation and photic stimulation were not performed.   Of  note, EEG was technically difficult due to significant myogenic and movement artifact. ABNORMALITY - Continuous slow, generalized IMPRESSION: This technically difficult study is suggestive of generalized cerebral dysfunction (encephalopathy). No seizures or epileptiform discharges were seen throughout the recording. Priyanka KIDD Shelton   CT ANGIO HEAD NECK W WO CM (CODE STROKE) Result Date: 07/10/2024 EXAM: CTA HEAD AND NECK WITH AND WITHOUT 07/10/2024 11:13:00 AM TECHNIQUE: CTA of the head and neck was performed with and without the administration of 75 mL of iohexol (OMNIPAQUE) 350 MG/ML injection. Multiplanar 2D and/or 3D reformatted images are provided for review. Automated exposure control, iterative reconstruction, and/or weight based adjustment of the mA/kV was utilized to reduce the radiation dose to as low as reasonably achievable. Stenosis of the internal carotid arteries measured using NASCET criteria. COMPARISON: CT of the head dated 07/10/2024 and 09/09/2023. CLINICAL HISTORY: Neuro deficit, acute, stroke suspected. 75ml omni 350 IV. Code stroke. Dr. Sallyann [phone] Transient left sided weakness, left facial droop, vomiting. FINDINGS: CTA NECK: AORTIC ARCH AND ARCH VESSELS: No dissection or arterial injury. No significant stenosis  of the brachiocephalic or subclavian arteries. CERVICAL CAROTID ARTERIES: No dissection, arterial injury, or hemodynamically significant stenosis by NASCET criteria. CERVICAL VERTEBRAL ARTERIES: No dissection, arterial injury, or significant stenosis. LUNGS AND MEDIASTINUM: Unremarkable. SOFT TISSUES: No acute abnormality. BONES: No acute abnormality. CTA HEAD: ANTERIOR CIRCULATION: No significant stenosis of the internal carotid arteries. No significant stenosis of the anterior cerebral arteries. No significant stenosis of the middle cerebral arteries. No aneurysm. POSTERIOR CIRCULATION: No significant stenosis of the posterior cerebral arteries. No significant stenosis of  the basilar artery. No significant stenosis of the vertebral arteries. No aneurysm. OTHER: Findings remain concerning her mass within the posteromedial aspect of the left parietal lobe. A calcified masses again noted in the trigone of the left lateral ventricle. No dural venous sinus thrombosis on this non-dedicated study. The above findings were communicated to Dr. Sallyann at 11:26 am 07/10/2024. IMPRESSION: 1. No large vessel occlusion, hemodynamically significant stenosis, or aneurysm in the head or neck. 2. Mass within the posteromedial aspect of the left parietal lobe and calcified mass in the trigone of the left lateral ventricle, as seen on prior CT head dated 09/09/23. 3. Findings communicated to Dr. Sallyann at 11:26 am on 07/10/24. Electronically signed by: Evalene Coho MD 07/10/2024 11:31 AM EDT RP Workstation: [id number]   CT HEAD CODE STROKE WO CONTRAST Result Date: 07/10/2024 EXAM: CT HEAD WITHOUT CONTRAST 07/10/2024 10:42:04 AM TECHNIQUE: CT of the head was performed without the administration of intravenous contrast. Automated exposure control, iterative reconstruction, and/or weight based adjustment of the mA/kV was utilized to reduce the radiation dose to as low as reasonably achievable. COMPARISON: CT of the head dated 09/09/2023. CLINICAL HISTORY: Neuro deficit, acute, stroke suspected. Code stroke. Dr. Sallyann [phone]; Transient left sided weakness, left facial droop, vomiting. FINDINGS: BRAIN AND VENTRICLES: No acute hemorrhage. New hypodense attenuation mass present immediately within the left parietal lobe on image 20 of series 2, measuring approximately 2.6 x 2.6 x 2.1 cm. Ovoid calcified lesion demonstrated within the trigone of the left lateral ventricle, measuring approximately 2.8 x 2.8 x 3.4 cm, which appears to be stable in the interim. There are chronic encephalomalacia changes within the left temporal lobe. There is moderate periventricular white matter disease. No hydrocephalus. No  extra-axial collection. No mass effect or midline shift. [place] stroke program early CT (ASPECT) score: Ganglionic (caudate, IC, lentiform nucleus, insula, M1-M3): 7 Supraganglionic (M4-M6): 3 Total: 10 ORBITS: No acute abnormality. SINUSES: No acute abnormality. SOFT TISSUES AND SKULL: No acute soft tissue abnormality. No skull fracture. The above findings were communicated to Dr. Sallyann at 10:49 AM on 07/10/2024. IMPRESSION: 1. New mass in the left parietal lobe measuring approximately 2.6 x 2.6 x 2.1 cm. Correlation with an MRI of the brain without and with gadolinium contrast is recommended. 2. Stable ovoid calcified lesion in the trigone of the left lateral ventricle measuring approximately 2.8 x 2.8 x 3.4 cm. 3. Chronic encephalomalacia changes within the left temporal lobe. 4. Moderate periventricular white matter disease. 5. ASPECT score: 10. Electronically signed by: Evalene Coho MD 07/10/2024 10:56 AM EDT RP Workstation: [id number]    Assessment/Plan 1. Type 2 diabetes, controlled, with peripheral neuropathy (HCC) (Primary) - A1c 7.3 - sugars 100-200's - eye exam 05/2024 - needs urine microalbumin  - cont Jardiance , Januvia and glimepiride   2. Meningioma (HCC) - h/o left intraventricular meningioma s/p gamma knife treatment  - 10/15 ED evaluation> code stroke> CT head negative for stroke> new tumor> family declined aggressive treatment> neuro started Depakote for seizure  concern   3. Seizure (HCC) - see above  - cont Depakote> recently increased  4. PAF (paroxysmal atrial fibrillation) (HCC) - HR< 100 with metoprolol  - cont Eliquis for clot prevention  5. Moderate vascular dementia with anxiety (HCC) - no behaviors - dependent with ADLs except feeding - weight stable - cont skilled nursing   6. Primary hypertension - controlled with losartan   7. Hyperthyroidism - not on medication  8. Weight loss - stable  - cont mirtazapine   9. Insomnia due to other mental  disorder - cont Trazodone   - failed GDR due to in past      Family/ staff Communication: plan discussed with patient and nurse  Labs/tests ordered:  routine labs due 07/2025> add urine microalbumin

## 2024-08-08 DIAGNOSIS — F4321 Adjustment disorder with depressed mood: Secondary | ICD-10-CM | POA: Diagnosis not present

## 2024-08-08 DIAGNOSIS — F03C3 Unspecified dementia, severe, with mood disturbance: Secondary | ICD-10-CM | POA: Diagnosis not present

## 2024-08-08 DIAGNOSIS — F03C4 Unspecified dementia, severe, with anxiety: Secondary | ICD-10-CM | POA: Diagnosis not present

## 2024-08-08 DIAGNOSIS — F5104 Psychophysiologic insomnia: Secondary | ICD-10-CM | POA: Diagnosis not present

## 2024-08-08 DIAGNOSIS — F03C11 Unspecified dementia, severe, with agitation: Secondary | ICD-10-CM | POA: Diagnosis not present

## 2024-08-20 ENCOUNTER — Encounter: Payer: Self-pay | Admitting: Nurse Practitioner

## 2024-08-20 ENCOUNTER — Non-Acute Institutional Stay (SKILLED_NURSING_FACILITY): Payer: Self-pay | Admitting: Nurse Practitioner

## 2024-08-20 DIAGNOSIS — R569 Unspecified convulsions: Secondary | ICD-10-CM

## 2024-08-20 DIAGNOSIS — L89152 Pressure ulcer of sacral region, stage 2: Secondary | ICD-10-CM | POA: Insufficient documentation

## 2024-08-20 DIAGNOSIS — D329 Benign neoplasm of meninges, unspecified: Secondary | ICD-10-CM

## 2024-08-20 DIAGNOSIS — L89153 Pressure ulcer of sacral region, stage 3: Secondary | ICD-10-CM | POA: Insufficient documentation

## 2024-08-20 DIAGNOSIS — F01B4 Vascular dementia, moderate, with anxiety: Secondary | ICD-10-CM | POA: Diagnosis not present

## 2024-08-20 NOTE — Assessment & Plan Note (Signed)
 depakote  increased to 375 mg BID for seizure prevention during hospitalization- family questions if this is causing decrease in her overall function vs progression dementia. Will decrease depakote  to 250 mg BID

## 2024-08-20 NOTE — Assessment & Plan Note (Signed)
 Pressure reduction Continue dressing changes  Continue nutritional support

## 2024-08-20 NOTE — Assessment & Plan Note (Signed)
 New lesion noted on imaging, no additional work up per family.

## 2024-08-20 NOTE — Assessment & Plan Note (Signed)
 Progressive decline noted, question if depakote  contributing decrease in function.  Comfort is key per daughter.  Continue supportive care.

## 2024-08-20 NOTE — Progress Notes (Signed)
 Location:  Other [place].  Nursing Home Room Number: [facility] [id number] Place of Service:  SNF [phone]) [name], NP  PCP: Laurence Locus, DO  Patient Care Team: Laurence Locus, DO as PCP - General (Internal Medicine) [name], MD as PCP - Cardiology (Cardiology) [name], MD as Consulting Physician (Ophthalmology)  Extended Emergency Contact Information Primary Emergency Contact: Hyndman,Fred Address: [address]          [place] [address] [place] Home Phone: [phone] Relation: Spouse Secondary Emergency Contact: Jones,Nancy Mobile Phone: [phone] Relation: Daughter Preferred language: English  Goals of care: Advanced Directive information    06/06/2024   12:16 PM  Advanced Directives  Does Patient Have a Medical Advance Directive? Yes  Type of Advance Directive Living will;Out of facility DNR (pink MOST or yellow form)  Does patient want to make changes to medical advance directive? No - Patient declined     Chief Complaint  Patient presents with   Decline    Decline     HPI:  Pt is a 82 y.o. female seen today for an acute visit for overall decline Staff has noticed more leaning.  Staff put hers in the bed in the afternoon due to leaning as she is tired.  She sits and stares at food or plays with her food Does not talk appropriately  sometimes garbled or incoherent.  These are not acute changes- gradual decline noted Did not start after ED visit but was ongoing.  Noted to have involuntary movement of left arm- noted more over the last few weeks.  Staff also notes more assistance with ADLs not consistent- some days she is able to do with little help and other days needs max assist  Noted to have a stage 2 on sacrum - nursing treating with triple paste with proximal  Weight has been stable. Staff is feeding her at this time.  On medpass twice daily    Past Medical History:  Diagnosis Date   Actinic keratosis  06/25/2020   L pretibia distal - bx proven    Balance problems    BCC (basal cell carcinoma of skin) 12/17/2020   right temple, Moh's 01/19/2021   Brain tumor (HCC)    a. Left intraventricular tumor ->stable by 06/2017 MRI. Followed @ [facility].   Dysplastic nevus 06/25/2020   R flank - moderate   Gait disturbance    a. Unsteady on feet/balance difficulty.   Generalized osteoarthritis of multiple sites    GERD (gastroesophageal reflux disease)    History of DVT (deep vein thrombosis) 2016   estrogen and airflights. Rx with xarelto  for 3 months   History of stress test    a. 11/2016 ETT: No ST/T changes.  Performed to assess PAC/PVC burden with activity ->No ectopy noted.   Hypertension    Hyperthyroidism    treated briefly in college   Insomnia    Obesity    PAF (paroxysmal atrial fibrillation) (HCC)    a. 12/2016 Event Monitor: brief run of PAF-->CHA2DS2VASc = 5--> Xarelto .   Plantar fasciitis    Retinal tear of left eye    Rosacea    SCC (squamous cell carcinoma) 08/12/2021   left chest treated with ED& C 04/06/22   Squamous cell carcinoma of skin 09/07/2017   R dorsum of hand    Type 2 diabetes, controlled, with peripheral neuropathy (HCC)    a. 11/2016 A1c 7.4.   Uterine fibroid    Past Surgical History:  Procedure  Laterality Date   CARPAL TUNNEL RELEASE Right    Dr Sissy   CATARACT EXTRACTION     Gamma knife  07/2020   intraventricular mass---meningioma or choroid plexus papilloma   RETINAL TEAR REPAIR CRYOTHERAPY     10/09/13, then again 3/15    Allergies  Allergen Reactions   Penicillins Other (See Comments)    Unknown reaction   Sulfa Antibiotics Itching and Rash   Zithromax [Azithromycin] Palpitations    Outpatient Encounter Medications as of 08/20/2024  Medication Sig   acetaminophen  (TYLENOL ) 325 MG tablet Take 650 mg by mouth 3 (three) times daily. max of 3000mg  of acetaminophen  per 24 hours, from all sources   apixaban (ELIQUIS) 5 MG TABS tablet Take 5 mg  by mouth 2 (two) times daily.   divalproex  (DEPAKOTE ) 125 MG DR tablet Take 3 tablets (375 mg total) by mouth 2 (two) times daily.   empagliflozin  (JARDIANCE ) 25 MG TABS tablet Take 1 tablet (25 mg total) by mouth daily before breakfast.   glimepiride  (AMARYL ) 2 MG tablet Take 2 mg by mouth daily.   hydrocortisone (ANUSOL-HC) 2.5 % rectal cream Place 1 Application rectally See admin instructions. Apply to hemorrhoids topically every 6 hours as needed for hemorrhoids after bowel movement.   losartan  (COZAAR ) 100 MG tablet Take 100 mg by mouth daily.   metoprolol  succinate (TOPROL -XL) 25 MG 24 hr tablet Take 1 tablet (25 mg total) by mouth daily.   mirtazapine (REMERON) 15 MG tablet Take 15 mg by mouth at bedtime.   Multiple Vitamins-Minerals (MULTIVITAMIN WITH MINERALS) tablet Take 1 tablet by mouth daily.   Nutritional Supplements (NUTRITIONAL DRINK) LIQD Take 1 Container by mouth 3 (three) times daily. MedPass   polyethylene glycol (MIRALAX / GLYCOLAX) 17 g packet Take 17 g by mouth at bedtime.   sitaGLIPtin (JANUVIA) 100 MG tablet Take 100 mg by mouth daily.   traZODone  (DESYREL ) 50 MG tablet Take 25 mg by mouth at bedtime.   Zinc Oxide (TRIPLE PASTE) 12.8 % ointment Apply 1 Application topically daily.   No facility-administered encounter medications on file as of 08/20/2024.    Review of Systems  Unable to perform ROS: Dementia    Immunization History  Administered Date(s) Administered   INFLUENZA, HIGH DOSE SEASONAL PF 07/09/2014, 07/23/2015, 06/02/2016, 07/02/2019, 07/13/2022   Influenza,inj,Quad PF,6+ Mos 06/23/2017, 07/17/2018   Influenza-Unspecified 07/23/2015, 06/22/2020, 07/08/2021, 07/20/2023, 07/09/2024   Moderna Covid-19 Fall Seasonal Vaccine 70yrs & older 01/12/2023, 01/05/2024   Moderna Covid-19 Vaccine  Bivalent Booster 83yrs & up 02/22/2022   Moderna Sars-Covid-2 Vaccination 10/08/2019, 11/05/2019, 07/24/2020, 02/28/2021, 08/05/2022   Pfizer Covid-19 Vaccine Bivalent  Booster 16yrs & up 06/17/2021   Pneumococcal Conjugate-13 01/30/2014   Pneumococcal Polysaccharide-23 09/13/2004, 11/03/2016   RSV,unspecified 09/13/2023   Tdap 10/23/2012, 02/08/2023   Unspecified SARS-COV-2 Vaccination 06/23/2023, 07/19/2024   Zoster Recombinant(Shingrix) 11/19/2018, 04/11/2019   Zoster, Live 10/19/2006   Pertinent  Health Maintenance Due  Topic Date Due   OPHTHALMOLOGY EXAM  12/13/2024   HEMOGLOBIN A1C  01/26/2025   FOOT EXAM  05/17/2025   Influenza Vaccine  Completed   Bone Density Scan  Completed   Mammogram  Discontinued      10/29/2021    9:41 AM 08/03/2022    1:10 PM 02/28/2023    9:50 AM 03/19/2024    3:36 PM 07/30/2024   12:03 PM  Fall Risk  Falls in the past year? 0 1 0 1 1  Was there an injury with Fall?  0 0 0 0  Fall Risk Category Calculator  2 0 2 1  Fall Risk Category (Retired)  Moderate      (RETIRED) Patient Fall Risk Level  Moderate fall risk      Patient at Risk for Falls Due to  History of fall(s)  History of fall(s);Impaired balance/gait;Impaired mobility History of fall(s);Impaired balance/gait  Fall risk Follow up  Falls evaluation completed   Falls evaluation completed Falls evaluation completed     Data saved with a previous flowsheet row definition   Functional Status Survey:    Vitals:   08/20/24 1031  BP: (!) 122/59  Pulse: 86  Resp: 18  Temp: (!) 97.2 F (36.2 C)  SpO2: 98%  Weight: 162 lb (73.5 kg)  Height: 5' 6 (1.676 m)   Body mass index is 26.15 kg/m. Physical Exam Constitutional:      General: She is not in acute distress.    Appearance: She is well-developed. She is not diaphoretic.  HENT:     Head: Normocephalic and atraumatic.     Mouth/Throat:     Pharynx: No oropharyngeal exudate.  Eyes:     Conjunctiva/sclera: Conjunctivae normal.     Pupils: Pupils are equal, round, and reactive to light.  Cardiovascular:     Rate and Rhythm: Normal rate and regular rhythm.     Heart sounds: Normal heart sounds.   Pulmonary:     Effort: Pulmonary effort is normal.     Breath sounds: Normal breath sounds.  Abdominal:     General: Bowel sounds are normal.     Palpations: Abdomen is soft.  Musculoskeletal:     Cervical back: Normal range of motion and neck supple.     Right lower leg: No edema.     Left lower leg: No edema.  Skin:    General: Skin is warm and dry.  Neurological:     Mental Status: She is alert.     Motor: Weakness present.     Gait: Gait abnormal.  Psychiatric:        Mood and Affect: Mood normal.     Labs reviewed: Recent Labs    01/29/24 0635 02/15/24 0000 07/10/24 1039 07/10/24 1041 07/29/24 0000  NA 143 142 141 142 145  K 3.8 3.6 4.0 4.2 3.7  CL 105 105 103 103 105  CO2 29 28* 25  --  30*  GLUCOSE 113*  --  149* 144*  --   BUN 12 12 12 15 18   CREATININE 0.79 0.7 0.67 0.70 0.6  CALCIUM 8.8 8.7 8.9  --  8.7   Recent Labs    09/07/23 0800 09/09/23 1522 12/14/23 0000 02/15/24 0000 07/10/24 1039 07/29/24 0000  AST 11 41   < > 17 28 22   ALT 11 24   < > 20 45* 35  ALKPHOS  --  63   < > 79 85 58  BILITOT 0.5 0.4  --   --  0.8  --   PROT 6.2 7.4  --   --  6.9  --   ALBUMIN  --  4.2   < > 3.6 3.6 3.5   < > = values in this interval not displayed.   Recent Labs    09/09/23 1522 09/11/23 0000 01/29/24 0635 02/15/24 0000 03/19/24 0000 07/10/24 1039 07/10/24 1041 07/29/24 0000  WBC 9.1   < > 8.9   < > 9.4 11.2*  --  7.8  NEUTROABS 7.0   < > 7,102   < > 7,285.00  9.7*  --  5,873.00  HGB 12.3   < > 11.1*   < > 11.3* 13.1 13.3 12.1  HCT 36.6   < > 33.5*   < > 35* 39.7 39.0 37  MCV 88.2  --  86.8  --   --  87.8  --   --   PLT 247   < > 222   < > 226 219  --  204   < > = values in this interval not displayed.   Lab Results  Component Value Date   TSH 2.930 04/04/2023   Lab Results  Component Value Date   HGBA1C 6.8 07/29/2024   Lab Results  Component Value Date   CHOL 167 02/13/2023   HDL 43 (L) 02/13/2023   LDLCALC 88 02/13/2023   LDLDIRECT  104.0 10/29/2021   TRIG 302 (H) 02/13/2023   CHOLHDL 3.9 02/13/2023    Significant Diagnostic Results in last 30 days:  No results found.  Assessment/Plan Seizure (HCC) depakote  increased to 375 mg BID for seizure prevention during hospitalization- family questions if this is causing decrease in her overall function vs progression dementia. Will decrease depakote  to 250 mg BID   Pressure injury of sacral region, stage 2 (HCC) Pressure reduction Continue dressing changes  Continue nutritional support  Moderate dementia with anxiety (HCC) Progressive decline noted, question if depakote  contributing decrease in function.  Comfort is key per daughter.  Continue supportive care.   Meningioma [facility]) New lesion noted on imaging, no additional work up per family.     Javonne Dorko K. Caro BODILY [facility] [phone]

## 2024-08-28 ENCOUNTER — Non-Acute Institutional Stay (SKILLED_NURSING_FACILITY): Admitting: Orthopedic Surgery

## 2024-08-28 ENCOUNTER — Encounter: Payer: Self-pay | Admitting: Orthopedic Surgery

## 2024-08-28 DIAGNOSIS — E1142 Type 2 diabetes mellitus with diabetic polyneuropathy: Secondary | ICD-10-CM

## 2024-08-28 DIAGNOSIS — R634 Abnormal weight loss: Secondary | ICD-10-CM

## 2024-08-28 DIAGNOSIS — I10 Essential (primary) hypertension: Secondary | ICD-10-CM

## 2024-08-28 DIAGNOSIS — R569 Unspecified convulsions: Secondary | ICD-10-CM | POA: Diagnosis not present

## 2024-08-28 DIAGNOSIS — F01B4 Vascular dementia, moderate, with anxiety: Secondary | ICD-10-CM

## 2024-08-28 DIAGNOSIS — I480 Paroxysmal atrial fibrillation: Secondary | ICD-10-CM | POA: Diagnosis not present

## 2024-08-28 DIAGNOSIS — D329 Benign neoplasm of meninges, unspecified: Secondary | ICD-10-CM | POA: Diagnosis not present

## 2024-08-28 DIAGNOSIS — E0590 Thyrotoxicosis, unspecified without thyrotoxic crisis or storm: Secondary | ICD-10-CM | POA: Diagnosis not present

## 2024-08-28 MED ORDER — DIVALPROEX SODIUM 125 MG PO DR TAB: 250.0000 mg | DELAYED_RELEASE_TABLET | Freq: Two times a day (BID) | ORAL | Status: AC

## 2024-08-28 NOTE — Progress Notes (Signed)
 Location:  Other [facility].  Nursing Home Room Number: [facility] [id number] Place of Service:  SNF [phone]) Provider:  Greig Cluster, NP  Laurence Locus, DO  Patient Care Team: Laurence Locus, DO as PCP - General (Internal Medicine) [name], MD as PCP - Cardiology (Cardiology) [name], MD as Consulting Physician (Ophthalmology)  Extended Emergency Contact Information Primary Emergency Contact: Magid,Fred Address: [address]          [place] [address] [place] Home Phone: [phone] Relation: Spouse Secondary Emergency Contact: Jones,Nancy Mobile Phone: [phone] Relation: Daughter Preferred language: English  Code Status:  DNR Goals of care: Advanced Directive information    08/28/2024    9:13 AM  Advanced Directives  Does Patient Have a Medical Advance Directive? Yes  Type of Advance Directive Living will;Out of facility DNR (pink MOST or yellow form)  Does patient want to make changes to medical advance directive? No - Patient declined     Chief Complaint  Patient presents with   Medical Management of Chronic Issues    Medical Management of Chronic Issues.     HPI:  Pt is a 83 y.o. female seen today for medical management of chronic diseases.    She currently resides on the skilled nursing unit at [facility]. PMH: atherosclerosis, HTN, PAF, T2DM, hyperthyroidism, meningioma, dementia, OA, malnutrition, retinal detachment, seizure and B12 deficiency.    T2DM- A1c 6.8 07/29/2024, eye exam 06/12/2024, remains on Jardiance , Januvia and glimepiride  Meningioma/Seizure- new lesion noted on last imaging study, Depakote  was started for seizure prevention> recently reduced due to decline in overall function, ALT/AST 35/22 07/29/2024 PAF- TSH 2.930 04/04/2023, rate controlled with metoprolol , remains on Eliquis for clot prevention Dementia- SLUMS 13/30 11/07/2022, 06/2024 CT head noted moderate periventricular white matter disease, gradual  decline, feeding assist, weights trending down, no behaviors, goals of care comfort based per daughter, remains on Trazodone  HTN- BUN/creat 18/0.6 07/29/2024, remains on losartan  Hyperthyroidism- TSH 2.930 04/04/2023, not on medication Weight loss- see trends below, remains on mirtazapine and Ensure 3x/week  Recent weights:  12/03- 161.2 lbs  11/05- 163.9 lbs  10/01- 164.4 lbs  01/01- 194.8 lbs   Blood pressures:  12/03- 115/62  11/26- 136/63, 117/63  11/19- 122/59, 148/84   Past Medical History:  Diagnosis Date   Actinic keratosis 06/25/2020   L pretibia distal - bx proven    Balance problems    BCC (basal cell carcinoma of skin) 12/17/2020   right temple, Moh's 01/19/2021   Brain tumor (HCC)    a. Left intraventricular tumor ->stable by 06/2017 MRI. Followed @ [facility].   Dysplastic nevus 06/25/2020   R flank - moderate   Gait disturbance    a. Unsteady on feet/balance difficulty.   Generalized osteoarthritis of multiple sites    GERD (gastroesophageal reflux disease)    History of DVT (deep vein thrombosis) 2016   estrogen and airflights. Rx with xarelto  for 3 months   History of stress test    a. 11/2016 ETT: No ST/T changes.  Performed to assess PAC/PVC burden with activity ->No ectopy noted.   Hypertension    Hyperthyroidism    treated briefly in college   Insomnia    Obesity    PAF (paroxysmal atrial fibrillation) (HCC)    a. 12/2016 Event Monitor: brief run of PAF-->CHA2DS2VASc = 5--> Xarelto .   Plantar fasciitis    Retinal tear of left eye    Rosacea    SCC (squamous cell carcinoma) 08/12/2021  left chest treated with ED& C 04/06/22   Squamous cell carcinoma of skin 09/07/2017   R dorsum of hand    Type 2 diabetes, controlled, with peripheral neuropathy (HCC)    a. 11/2016 A1c 7.4.   Uterine fibroid    Past Surgical History:  Procedure Laterality Date   CARPAL TUNNEL RELEASE Right    Dr Sissy   CATARACT EXTRACTION     Gamma knife  07/2020    intraventricular mass---meningioma or choroid plexus papilloma   RETINAL TEAR REPAIR CRYOTHERAPY     10/09/13, then again 3/15    Allergies  Allergen Reactions   Penicillins Other (See Comments)    Unknown reaction   Sulfa Antibiotics Itching and Rash   Zithromax [Azithromycin] Palpitations    Outpatient Encounter Medications as of 08/28/2024  Medication Sig   acetaminophen  (TYLENOL ) 325 MG tablet Take 650 mg by mouth 3 (three) times daily. max of 3000mg  of acetaminophen  per 24 hours, from all sources   apixaban (ELIQUIS) 5 MG TABS tablet Take 5 mg by mouth 2 (two) times daily.   divalproex  (DEPAKOTE ) 125 MG DR tablet Take 3 tablets (375 mg total) by mouth 2 (two) times daily. (Patient taking differently: Take 250 mg by mouth 2 (two) times daily.)   empagliflozin  (JARDIANCE ) 25 MG TABS tablet Take 1 tablet (25 mg total) by mouth daily before breakfast.   glimepiride  (AMARYL ) 2 MG tablet Take 2 mg by mouth daily.   hydrocortisone (ANUSOL-HC) 2.5 % rectal cream Place 1 Application rectally See admin instructions. Apply to hemorrhoids topically every 6 hours as needed for hemorrhoids after bowel movement.   losartan  (COZAAR ) 100 MG tablet Take 100 mg by mouth daily.   metoprolol  succinate (TOPROL -XL) 25 MG 24 hr tablet Take 1 tablet (25 mg total) by mouth daily.   mirtazapine (REMERON) 15 MG tablet Take 15 mg by mouth at bedtime.   Multiple Vitamins-Minerals (MULTIVITAMIN WITH MINERALS) tablet Take 1 tablet by mouth daily.   Nutritional Supplements (NUTRITIONAL DRINK) LIQD Take 1 Container by mouth 3 (three) times daily. MedPass   polyethylene glycol (MIRALAX / GLYCOLAX) 17 g packet Take 17 g by mouth at bedtime.   sitaGLIPtin (JANUVIA) 100 MG tablet Take 100 mg by mouth daily.   traZODone  (DESYREL ) 50 MG tablet Take 25 mg by mouth at bedtime.   Zinc Oxide (TRIPLE PASTE) 12.8 % ointment Apply 1 Application topically daily.   No facility-administered encounter medications on file as of  08/28/2024.    Review of Systems  Unable to perform ROS: Dementia    Immunization History  Administered Date(s) Administered   INFLUENZA, HIGH DOSE SEASONAL PF 07/09/2014, 07/23/2015, 06/02/2016, 07/02/2019, 07/13/2022   Influenza,inj,Quad PF,6+ Mos 06/23/2017, 07/17/2018   Influenza-Unspecified 07/23/2015, 06/22/2020, 07/08/2021, 07/20/2023, 07/09/2024   Moderna Covid-19 Fall Seasonal Vaccine 13yrs & older 01/12/2023, 01/05/2024   Moderna Covid-19 Vaccine  Bivalent Booster 93yrs & up 02/22/2022   Moderna Sars-Covid-2 Vaccination 10/08/2019, 11/05/2019, 07/24/2020, 02/28/2021, 08/05/2022   Pfizer Covid-19 Vaccine Bivalent Booster 18yrs & up 06/17/2021   Pneumococcal Conjugate-13 01/30/2014   Pneumococcal Polysaccharide-23 09/13/2004, 11/03/2016   RSV,unspecified 09/13/2023   Tdap 10/23/2012, 02/08/2023   Unspecified SARS-COV-2 Vaccination 06/23/2023, 07/19/2024   Zoster Recombinant(Shingrix) 11/19/2018, 04/11/2019   Zoster, Live 10/19/2006   Pertinent  Health Maintenance Due  Topic Date Due   OPHTHALMOLOGY EXAM  12/13/2024   HEMOGLOBIN A1C  01/26/2025   FOOT EXAM  05/17/2025   Influenza Vaccine  Completed   Bone Density Scan  Completed   Mammogram  Discontinued      10/29/2021    9:41 AM 08/03/2022    1:10 PM 02/28/2023    9:50 AM 03/19/2024    3:36 PM 07/30/2024   12:03 PM  Fall Risk  Falls in the past year? 0 1 0 1 1  Was there an injury with Fall?  0  0  0  0   Fall Risk Category Calculator  2 0 2 1  Fall Risk Category (Retired)  Moderate      (RETIRED) Patient Fall Risk Level  Moderate fall risk      Patient at Risk for Falls Due to  History of fall(s)  History of fall(s);Impaired balance/gait;Impaired mobility History of fall(s);Impaired balance/gait  Fall risk Follow up  Falls evaluation completed   Falls evaluation completed Falls evaluation completed     Data saved with a previous flowsheet row definition   Functional Status Survey:    Vitals:   08/28/24 0906   BP: 136/63  Pulse: 85  Resp: 17  Temp: 99.4 F (37.4 C)  SpO2: 98%  Weight: 159 lb 9.6 oz (72.4 kg)  Height: 5' 6 (1.676 m)   Body mass index is 25.76 kg/m. Physical Exam Vitals reviewed.  Constitutional:      General: She is not in acute distress. HENT:     Head: Normocephalic.     Right Ear: There is no impacted cerumen.     Left Ear: There is no impacted cerumen.     Nose: Nose normal.     Mouth/Throat:     Mouth: Mucous membranes are moist.  Eyes:     General:        Right eye: No discharge.        Left eye: No discharge.  Cardiovascular:     Rate and Rhythm: Normal rate and regular rhythm.     Pulses: Normal pulses.     Heart sounds: Normal heart sounds.  Pulmonary:     Effort: Pulmonary effort is normal. No respiratory distress.     Breath sounds: Normal breath sounds. No wheezing or rales.  Abdominal:     General: Bowel sounds are normal. There is no distension.     Palpations: Abdomen is soft.     Tenderness: There is no abdominal tenderness.  Musculoskeletal:     Cervical back: Neck supple.     Right lower leg: No edema.     Left lower leg: No edema.  Skin:    General: Skin is warm.     Capillary Refill: Capillary refill takes less than 2 seconds.  Neurological:     General: No focal deficit present.     Mental Status: She is alert. Mental status is at baseline.     Gait: Gait abnormal.  Psychiatric:        Mood and Affect: Mood normal.     Comments: Garbled speech, very pleasant, followed some commands, alert to self/familiar face     Labs reviewed: Recent Labs    01/29/24 0635 02/15/24 0000 07/10/24 1039 07/10/24 1041 07/29/24 0000  NA 143 142 141 142 145  K 3.8 3.6 4.0 4.2 3.7  CL 105 105 103 103 105  CO2 29 28* 25  --  30*  GLUCOSE 113*  --  149* 144*  --   BUN 12 12 12 15 18   CREATININE 0.79 0.7 0.67 0.70 0.6  CALCIUM 8.8 8.7 8.9  --  8.7   Recent Labs    09/07/23 0800 09/09/23 1522  12/14/23 0000 02/15/24 0000 07/10/24 1039  07/29/24 0000  AST 11 41   < > 17 28 22   ALT 11 24   < > 20 45* 35  ALKPHOS  --  63   < > 79 85 58  BILITOT 0.5 0.4  --   --  0.8  --   PROT 6.2 7.4  --   --  6.9  --   ALBUMIN  --  4.2   < > 3.6 3.6 3.5   < > = values in this interval not displayed.   Recent Labs    09/09/23 1522 09/11/23 0000 01/29/24 0635 02/15/24 0000 03/19/24 0000 07/10/24 1039 07/10/24 1041 07/29/24 0000  WBC 9.1   < > 8.9   < > 9.4 11.2*  --  7.8  NEUTROABS 7.0   < > 7,102   < > 7,285.00 9.7*  --  5,873.00  HGB 12.3   < > 11.1*   < > 11.3* 13.1 13.3 12.1  HCT 36.6   < > 33.5*   < > 35* 39.7 39.0 37  MCV 88.2  --  86.8  --   --  87.8  --   --   PLT 247   < > 222   < > 226 219  --  204   < > = values in this interval not displayed.   Lab Results  Component Value Date   TSH 2.930 04/04/2023   Lab Results  Component Value Date   HGBA1C 6.8 07/29/2024   Lab Results  Component Value Date   CHOL 167 02/13/2023   HDL 43 (L) 02/13/2023   LDLCALC 88 02/13/2023   LDLDIRECT 104.0 10/29/2021   TRIG 302 (H) 02/13/2023   CHOLHDL 3.9 02/13/2023    Significant Diagnostic Results in last 30 days:  No results found.  Assessment/Plan 1. Type 2 diabetes, controlled, with peripheral neuropathy (HCC) (Primary) - recent A1c 6.8, goal < 7 - no hypoglycemia - eye exam 05/2024 - cont Jardiance , Januvia and glimepiride   2. Meningioma (HCC) - new lesion noted last CT head 10/15 - no additional workup per goals of care  3. Seizure (HCC) - no recent episodes - Depakote  decreased due to recent decline  - cont Depakote  250 mg BID  4. PAF (paroxysmal atrial fibrillation) (HCC) - HR < 100 with metoprolol  - cont Eliquis for clot prevention  5. Moderate vascular dementia with anxiety (HCC) - gradual decline with weight loss - dependent with ADLs including feeding  - no behaviors - cont Depakote  and Trazodone   6. Primary hypertension - controlled with losartan   7. Hyperthyroidism - TSH stable - not on  medication  8. Weight loss - weight loss almost 30 lbs since 09/2023 - followed by dietary - suspect due to progression of dementia  - cont mirtazapine    Family/ staff Communication: plan discussed with nurse  Labs/tests ordered:  none

## 2024-08-31 DIAGNOSIS — F4321 Adjustment disorder with depressed mood: Secondary | ICD-10-CM | POA: Diagnosis not present

## 2024-09-04 DIAGNOSIS — F4321 Adjustment disorder with depressed mood: Secondary | ICD-10-CM | POA: Diagnosis not present

## 2024-09-17 ENCOUNTER — Non-Acute Institutional Stay (SKILLED_NURSING_FACILITY): Payer: Self-pay | Admitting: Internal Medicine

## 2024-09-17 ENCOUNTER — Encounter: Payer: Self-pay | Admitting: Internal Medicine

## 2024-09-17 DIAGNOSIS — Z7901 Long term (current) use of anticoagulants: Secondary | ICD-10-CM

## 2024-09-17 DIAGNOSIS — L89153 Pressure ulcer of sacral region, stage 3: Secondary | ICD-10-CM | POA: Diagnosis not present

## 2024-09-17 DIAGNOSIS — E119 Type 2 diabetes mellitus without complications: Secondary | ICD-10-CM

## 2024-09-17 DIAGNOSIS — Z7984 Long term (current) use of oral hypoglycemic drugs: Secondary | ICD-10-CM

## 2024-09-17 DIAGNOSIS — I480 Paroxysmal atrial fibrillation: Secondary | ICD-10-CM | POA: Diagnosis not present

## 2024-09-17 DIAGNOSIS — R569 Unspecified convulsions: Secondary | ICD-10-CM | POA: Diagnosis not present

## 2024-09-17 DIAGNOSIS — F01B4 Vascular dementia, moderate, with anxiety: Secondary | ICD-10-CM | POA: Diagnosis not present

## 2024-09-17 DIAGNOSIS — I10 Essential (primary) hypertension: Secondary | ICD-10-CM

## 2024-09-17 DIAGNOSIS — M159 Polyosteoarthritis, unspecified: Secondary | ICD-10-CM

## 2024-09-17 NOTE — Assessment & Plan Note (Signed)
 Continue with Januvia 100 mg daily, Jardiance  25 mg daily.  Last hemoglobin A1c of 6.8% indicating good outpatient control of her diabetes. HgBA1c and mean plasma glucose: Lab Results  Component Value Date/Time   HGBA1C 6.8 07/29/2024 12:00 AM   MPG 189 09/07/2023 08:00 AM

## 2024-09-17 NOTE — Assessment & Plan Note (Signed)
 Continue on Eliquis 5 mg twice daily, Toprol -XL 25 mg daily

## 2024-09-17 NOTE — Assessment & Plan Note (Signed)
 Continue on Depakote  250 mg twice daily

## 2024-09-17 NOTE — Assessment & Plan Note (Signed)
 Wound care RN following at SNF. Wound care physician also consulted.  Picture from 09-10-2024

## 2024-09-17 NOTE — Assessment & Plan Note (Signed)
 Continue with Tylenol  650 mg 3 times daily

## 2024-09-17 NOTE — Assessment & Plan Note (Signed)
 Currently not on any medications for her dementia.  Her Depakote  is for seizures prevention.

## 2024-09-17 NOTE — Progress Notes (Signed)
 [facility] Routine Visit Progress Note    Location:  Other [place].  Nursing Home Room Number: [facility] [id number] Place of Service:  SNF (31)   Laurence Locus, DO   Patient Care Team: Laurence Locus, DO as PCP - General (Internal Medicine) [name], MD as PCP - Cardiology (Cardiology) [name], MD as Consulting Physician (Ophthalmology)   Extended Emergency Contact Information Primary Emergency Contact: Veltri,Fred Address: [address]          [place] [address] [place] Home Phone: [phone] Relation: Spouse Secondary Emergency Contact: Jones,Nancy Mobile Phone: [phone] Relation: Daughter Preferred language: English   Goals of care: Advanced Directive information    08/28/2024    9:13 AM  Advanced Directives  Does Patient Have a Medical Advance Directive? Yes  Type of Advance Directive Living will;Out of facility DNR (pink MOST or yellow form)  Does patient want to make changes to medical advance directive? No - Patient declined    CODE STATUS: Do Not Resuscitate (DNR)   Chief Complaint  Patient presents with   Medical Management of Chronic Issues    Medical Management of Chronic Issues.      HPI: Angela Bentley is a 83 y.o. female seen today for medical management of chronic disease.  Informed by social work that the patient is enrolling into hospice.  May need another progress note in order for her PASSAR number to be reviewed.  Patient seen laying in bed.  She has no complaints.  Patient has a chronic stage II deep tissue injury/wound on her sacrum that is being followed by wound care.  Patient is a 83 year old female with a prior history of essential hypertension, paroxysmal atrial fibrillation, chronic anticoagulation, history of meningioma, moderate dementia with anxiety, history of seizure disorder.  Patient unable to give any history or review systems.  She denies any pain.  She is laying supine in bed.  Past Medical  History:  Diagnosis Date   Actinic keratosis 06/25/2020   L pretibia distal - bx proven    Balance problems    BCC (basal cell carcinoma of skin) 12/17/2020   right temple, Moh's 01/19/2021   Brain tumor (HCC)    a. Left intraventricular tumor ->stable by 06/2017 MRI. Followed @ [facility].   Dysplastic nevus 06/25/2020   R flank - moderate   Gait disturbance    a. Unsteady on feet/balance difficulty.   Generalized osteoarthritis of multiple sites    GERD (gastroesophageal reflux disease)    History of DVT (deep vein thrombosis) 2016   estrogen and airflights. Rx with xarelto  for 3 months   History of stress test    a. 11/2016 ETT: No ST/T changes.  Performed to assess PAC/PVC burden with activity ->No ectopy noted.   Hypertension    Hyperthyroidism    treated briefly in college   ICH (intracerebral hemorrhage) (HCC) 07/10/2024   Insomnia    Obesity    PAF (paroxysmal atrial fibrillation) (HCC)    a. 12/2016 Event Monitor: brief run of PAF-->CHA2DS2VASc = 5--> Xarelto .   Plantar fasciitis    Retinal tear of left eye    Rosacea    SCC (squamous cell carcinoma) 08/12/2021   left chest treated with ED& C 04/06/22   Squamous cell carcinoma of skin 09/07/2017   R dorsum of hand    Type 2 diabetes, controlled, with peripheral neuropathy (HCC)    a. 11/2016 A1c 7.4.   Uterine fibroid    Past Surgical History:  Procedure Laterality Date   CARPAL TUNNEL RELEASE Right    Dr Sissy   CATARACT EXTRACTION     Gamma knife  07/2020   intraventricular mass---meningioma or choroid plexus papilloma   RETINAL TEAR REPAIR CRYOTHERAPY     10/09/13, then again 3/15     Allergies[1]   Outpatient Encounter Medications as of 09/17/2024  Medication Sig   acetaminophen  (TYLENOL ) 325 MG tablet Take 650 mg by mouth 3 (three) times daily. max of 3000mg  of acetaminophen  per 24 hours, from all sources   apixaban (ELIQUIS) 5 MG TABS tablet Take 5 mg by mouth 2 (two) times daily.   divalproex  (DEPAKOTE )  125 MG DR tablet Take 2 tablets (250 mg total) by mouth 2 (two) times daily.   empagliflozin  (JARDIANCE ) 25 MG TABS tablet Take 1 tablet (25 mg total) by mouth daily before breakfast.   glimepiride  (AMARYL ) 2 MG tablet Take 2 mg by mouth daily.   hydrocortisone (ANUSOL-HC) 2.5 % rectal cream Place 1 Application rectally See admin instructions. Apply to hemorrhoids topically every 6 hours as needed for hemorrhoids after bowel movement.   losartan  (COZAAR ) 100 MG tablet Take 100 mg by mouth daily.   metoprolol  succinate (TOPROL -XL) 25 MG 24 hr tablet Take 1 tablet (25 mg total) by mouth daily.   mirtazapine (REMERON) 15 MG tablet Take 15 mg by mouth at bedtime.   Multiple Vitamins-Minerals (MULTIVITAMIN WITH MINERALS) tablet Take 1 tablet by mouth daily.   Nutritional Supplements (NUTRITIONAL DRINK) LIQD Take 1 Container by mouth 3 (three) times daily. MedPass   polyethylene glycol (MIRALAX / GLYCOLAX) 17 g packet Take 17 g by mouth at bedtime.   Propylene Glycol-Glycerin (SOOTHE) 0.6-0.6 % SOLN Apply 1 drop to eye 3 (three) times daily.   sitaGLIPtin (JANUVIA) 100 MG tablet Take 100 mg by mouth daily.   traZODone  (DESYREL ) 50 MG tablet Take 25 mg by mouth at bedtime.   Zinc Oxide (TRIPLE PASTE) 12.8 % ointment Apply 1 Application topically daily.   No facility-administered encounter medications on file as of 09/17/2024.     Review of Systems  Unable to perform ROS: Dementia      Immunization History  Administered Date(s) Administered   INFLUENZA, HIGH DOSE SEASONAL PF 07/09/2014, 07/23/2015, 06/02/2016, 07/02/2019, 07/13/2022   Influenza,inj,Quad PF,6+ Mos 06/23/2017, 07/17/2018   Influenza-Unspecified 07/23/2015, 06/22/2020, 07/08/2021, 07/20/2023, 07/09/2024   Moderna Covid-19 Fall Seasonal Vaccine 42yrs & older 01/12/2023, 01/05/2024   Moderna Covid-19 Vaccine  Bivalent Booster 80yrs & up 02/22/2022   Moderna Sars-Covid-2 Vaccination 10/08/2019, 11/05/2019, 07/24/2020, 02/28/2021,  08/05/2022   Pfizer Covid-19 Vaccine Bivalent Booster 46yrs & up 06/17/2021   Pneumococcal Conjugate-13 01/30/2014   Pneumococcal Polysaccharide-23 09/13/2004, 11/03/2016   RSV,unspecified 09/13/2023   Tdap 10/23/2012, 02/08/2023   Unspecified SARS-COV-2 Vaccination 06/23/2023, 07/19/2024   Zoster Recombinant(Shingrix) 11/19/2018, 04/11/2019   Zoster, Live 10/19/2006   Pertinent  Health Maintenance Due  Topic Date Due   OPHTHALMOLOGY EXAM  12/13/2024   HEMOGLOBIN A1C  01/26/2025   FOOT EXAM  05/17/2025   Influenza Vaccine  Completed   Bone Density Scan  Completed   Mammogram  Discontinued      08/03/2022    1:10 PM 02/28/2023    9:50 AM 03/19/2024    3:36 PM 07/30/2024   12:03 PM 08/28/2024   11:57 AM  Fall Risk  Falls in the past year? 1 0 1 1 1   Was there an injury with Fall? 0  0  0  0  0  Fall Risk  Category Calculator 2 0 2 1 1   Fall Risk Category (Retired) Moderate       (RETIRED) Patient Fall Risk Level Moderate fall risk       Patient at Risk for Falls Due to History of fall(s)  History of fall(s);Impaired balance/gait;Impaired mobility History of fall(s);Impaired balance/gait History of fall(s);Impaired balance/gait  Fall risk Follow up Falls evaluation completed   Falls evaluation completed Falls evaluation completed Falls evaluation completed     Data saved with a previous flowsheet row definition   Functional Status Survey:     Vitals:   09/17/24 1218  BP: (!) 149/71  Pulse: 85  Resp: 18  Temp: (!) 94.7 F (34.8 C)  SpO2: 96%  Weight: 161 lb (73 kg)  Height: 5' 6 (1.676 m)   Body mass index is 25.99 kg/m. Physical Exam Vitals and nursing note reviewed.  Constitutional:      General: She is not in acute distress.    Comments: Appears chronically ill.  No distress.  HENT:     Head: Normocephalic and atraumatic.  Cardiovascular:     Rate and Rhythm: Normal rate and regular rhythm.  Pulmonary:     Effort: Pulmonary effort is normal.     Breath sounds:  Normal breath sounds.  Abdominal:     General: Bowel sounds are normal. There is no distension.     Palpations: Abdomen is soft.  Skin:    General: Skin is warm and dry.  Neurological:     Comments: Not oriented to place or time.  Responds to her name.     Labs reviewed: Recent Labs    01/29/24 0635 02/15/24 0000 07/10/24 1039 07/10/24 1041 07/29/24 0000  NA 143 142 141 142 145  K 3.8 3.6 4.0 4.2 3.7  CL 105 105 103 103 105  CO2 29 28* 25  --  30*  GLUCOSE 113*  --  149* 144*  --   BUN 12 12 12 15 18   CREATININE 0.79 0.7 0.67 0.70 0.6  CALCIUM 8.8 8.7 8.9  --  8.7   Recent Labs    02/15/24 0000 07/10/24 1039 07/29/24 0000  AST 17 28 22   ALT 20 45* 35  ALKPHOS 79 85 58  BILITOT  --  0.8  --   PROT  --  6.9  --   ALBUMIN 3.6 3.6 3.5   Recent Labs    01/29/24 0635 02/15/24 0000 03/19/24 0000 07/10/24 1039 07/10/24 1041 07/29/24 0000  WBC 8.9   < > 9.4 11.2*  --  7.8  NEUTROABS 7,102   < > 7,285.00 9.7*  --  5,873.00  HGB 11.1*   < > 11.3* 13.1 13.3 12.1  HCT 33.5*   < > 35* 39.7 39.0 37  MCV 86.8  --   --  87.8  --   --   PLT 222   < > 226 219  --  204   < > = values in this interval not displayed.   Lab Results  Component Value Date   TSH 2.930 04/04/2023   Lab Results  Component Value Date   HGBA1C 6.8 07/29/2024   Lab Results  Component Value Date   CHOL 167 02/13/2023   HDL 43 (L) 02/13/2023   LDLCALC 88 02/13/2023   LDLDIRECT 104.0 10/29/2021   TRIG 302 (H) 02/13/2023   CHOLHDL 3.9 02/13/2023     Assessment/Plan Pressure injury of sacral region, stage 3 (HCC) Wound care RN following at SNF. Wound care  physician also consulted.  Picture from 09-10-2024    PAF (paroxysmal atrial fibrillation) (HCC) Continue on Eliquis 5 mg twice daily, Toprol -XL 25 mg daily  Chronic anticoagulation On Eliquis 5 mg twice daily for CVA prophylaxis for A-fib  Essential hypertension Continue on Jardiance  25 mg daily, losartan  100 mg daily, Toprol -XL  25 mg daily  Moderate dementia with anxiety (HCC) Currently not on any medications for her dementia.  Her Depakote  is for seizures prevention.  Seizure (HCC) Continue on Depakote  250 mg twice daily  Type 2 diabetes mellitus treated without insulin (HCC) Continue with Januvia 100 mg daily, Jardiance  25 mg daily.  Last hemoglobin A1c of 6.8% indicating good outpatient control of her diabetes. HgBA1c and mean plasma glucose: Lab Results  Component Value Date/Time   HGBA1C 6.8 07/29/2024 12:00 AM   MPG 189 09/07/2023 08:00 AM     Generalized osteoarthritis of multiple sites Continue with Tylenol  650 mg 3 times daily    Camellia Door, DO  [facility] [phone]       [1]  Allergies Allergen Reactions   Penicillins Other (See Comments)    Unknown reaction   Sulfa Antibiotics Itching and Rash   Zithromax [Azithromycin] Palpitations

## 2024-09-17 NOTE — Assessment & Plan Note (Signed)
 On Eliquis 5 mg twice daily for CVA prophylaxis for A-fib

## 2024-09-17 NOTE — Assessment & Plan Note (Signed)
 Continue on Jardiance  25 mg daily, losartan  100 mg daily, Toprol -XL 25 mg daily

## 2024-10-14 ENCOUNTER — Non-Acute Institutional Stay (SKILLED_NURSING_FACILITY): Payer: Self-pay | Admitting: Orthopedic Surgery

## 2024-10-14 ENCOUNTER — Encounter: Payer: Self-pay | Admitting: Orthopedic Surgery

## 2024-10-14 DIAGNOSIS — I10 Essential (primary) hypertension: Secondary | ICD-10-CM | POA: Diagnosis not present

## 2024-10-14 DIAGNOSIS — I480 Paroxysmal atrial fibrillation: Secondary | ICD-10-CM | POA: Diagnosis not present

## 2024-10-14 DIAGNOSIS — F01B4 Vascular dementia, moderate, with anxiety: Secondary | ICD-10-CM

## 2024-10-14 DIAGNOSIS — L89152 Pressure ulcer of sacral region, stage 2: Secondary | ICD-10-CM | POA: Diagnosis not present

## 2024-10-14 DIAGNOSIS — E119 Type 2 diabetes mellitus without complications: Secondary | ICD-10-CM

## 2024-10-14 DIAGNOSIS — R634 Abnormal weight loss: Secondary | ICD-10-CM | POA: Diagnosis not present

## 2024-10-14 DIAGNOSIS — E0590 Thyrotoxicosis, unspecified without thyrotoxic crisis or storm: Secondary | ICD-10-CM | POA: Diagnosis not present

## 2024-10-14 DIAGNOSIS — R569 Unspecified convulsions: Secondary | ICD-10-CM

## 2024-10-14 DIAGNOSIS — Z7984 Long term (current) use of oral hypoglycemic drugs: Secondary | ICD-10-CM

## 2024-10-14 NOTE — Progress Notes (Signed)
 " Location:  Other [place].  Nursing Home Room Number: [facility] [id number] Place of Service:  SNF [phone]) Provider:  Greig Cluster, NP  PCP: Laurence Locus, DO  Patient Care Team: Laurence Locus, DO as PCP - General (Internal Medicine) [name], MD as PCP - Cardiology (Cardiology) [name], MD as Consulting Physician (Ophthalmology)  Extended Emergency Contact Information Primary Emergency Contact: Pickron,Fred Address: [address]          [place] [address] [place] Home Phone: [phone] Relation: Spouse Secondary Emergency Contact: Jones,Nancy Mobile Phone: [phone] Relation: Daughter Preferred language: English  Code Status:  DNR Goals of care: Advanced Directive information    08/28/2024    9:13 AM  Advanced Directives  Does Patient Have a Medical Advance Directive? Yes  Type of Advance Directive Living will;Out of facility DNR (pink MOST or yellow form)  Does patient want to make changes to medical advance directive? No - Patient declined     Chief Complaint  Patient presents with   Medical Management of Chronic Issues    Medical Management of Chronic Issues.     HPI:  Pt is a 84 y.o. female seen today for medical management of chronic diseases.    She currently resides on the skilled nursing unit at [facility]. PMH: atherosclerosis, HTN, PAF, T2DM, hyperthyroidism, meningioma, dementia, OA, malnutrition, retinal detachment, seizure and B12 deficiency.    T2DM- A1c 6.8 07/29/2024, eye exam 06/12/2024, sugars 170-300's, no hypoglycemia, remains on Jardiance , Januvia and glimepiride  Stage II sacrum- followed by wound care> 01/14 no change to treatment, remains on calcium alginate and proximel dressing, also has air mattress Meningioma/Seizure- new lesion noted on last imaging study, Depakote  was started for seizure prevention> recently reduced due to decline in overall function, ALT/AST 35/22 07/29/2024 PAF- TSH 2.930 04/04/2023,  rate controlled with metoprolol , remains on Eliquis for clot prevention Dementia- SLUMS 13/30 11/07/2022, 06/2024 CT head noted moderate periventricular white matter disease, gradual decline, feeding assist, weights trending down, no behaviors, goals of care comfort based per daughter, remains on Trazodone  HTN- BUN/creat 18/0.6 07/29/2024, remains on losartan  Hyperthyroidism- TSH 2.930 04/04/2023, not on medication Weight loss- see trends below, remains on mirtazapine and Ensure 3x  Recent weights:  01/07- 160 lbs  12/03- 161.2 lbs  11/05- 163.9 lbs  Recent blood pressures:  01/14- 107/58, 146/68  01/07- 156/65, 134/80   Past Medical History:  Diagnosis Date   Actinic keratosis 06/25/2020   L pretibia distal - bx proven    Balance problems    BCC (basal cell carcinoma of skin) 12/17/2020   right temple, Moh's 01/19/2021   Brain tumor (HCC)    a. Left intraventricular tumor ->stable by 06/2017 MRI. Followed @ [facility].   Dysplastic nevus 06/25/2020   R flank - moderate   Gait disturbance    a. Unsteady on feet/balance difficulty.   Generalized osteoarthritis of multiple sites    GERD (gastroesophageal reflux disease)    History of DVT (deep vein thrombosis) 2016   estrogen and airflights. Rx with xarelto  for 3 months   History of stress test    a. 11/2016 ETT: No ST/T changes.  Performed to assess PAC/PVC burden with activity ->No ectopy noted.   Hypertension    Hyperthyroidism    treated briefly in college   ICH (intracerebral hemorrhage) (HCC) 07/10/2024   Insomnia    Obesity    PAF (paroxysmal atrial fibrillation) (HCC)    a. 12/2016 Event Monitor: brief run of PAF-->CHA2DS2VASc =  5--> Xarelto .   Plantar fasciitis    Retinal tear of left eye    Rosacea    SCC (squamous cell carcinoma) 08/12/2021   left chest treated with ED& C 04/06/22   Squamous cell carcinoma of skin 09/07/2017   R dorsum of hand    Type 2 diabetes, controlled, with peripheral neuropathy (HCC)    a.  11/2016 A1c 7.4.   Uterine fibroid    Past Surgical History:  Procedure Laterality Date   CARPAL TUNNEL RELEASE Right    Dr Sissy   CATARACT EXTRACTION     Gamma knife  07/2020   intraventricular mass---meningioma or choroid plexus papilloma   RETINAL TEAR REPAIR CRYOTHERAPY     10/09/13, then again 3/15    Allergies[1]  Outpatient Encounter Medications as of 10/14/2024  Medication Sig   acetaminophen  (TYLENOL ) 325 MG tablet Take 650 mg by mouth 3 (three) times daily. max of 3000mg  of acetaminophen  per 24 hours, from all sources   apixaban (ELIQUIS) 5 MG TABS tablet Take 5 mg by mouth 2 (two) times daily.   divalproex  (DEPAKOTE ) 125 MG DR tablet Take 2 tablets (250 mg total) by mouth 2 (two) times daily.   empagliflozin  (JARDIANCE ) 25 MG TABS tablet Take 1 tablet (25 mg total) by mouth daily before breakfast.   glimepiride  (AMARYL ) 2 MG tablet Take 2 mg by mouth daily.   hydrocortisone (ANUSOL-HC) 2.5 % rectal cream Place 1 Application rectally See admin instructions. Apply to hemorrhoids topically every 6 hours as needed for hemorrhoids after bowel movement.   losartan  (COZAAR ) 100 MG tablet Take 100 mg by mouth daily.   metoprolol  succinate (TOPROL -XL) 25 MG 24 hr tablet Take 1 tablet (25 mg total) by mouth daily.   mirtazapine (REMERON) 15 MG tablet Take 15 mg by mouth at bedtime.   Multiple Vitamins-Minerals (MULTIVITAMIN WITH MINERALS) tablet Take 1 tablet by mouth daily.   Nutritional Supplements (NUTRITIONAL DRINK) LIQD Take 1 Container by mouth 3 (three) times daily. MedPass   polyethylene glycol (MIRALAX / GLYCOLAX) 17 g packet Take 17 g by mouth at bedtime.   Propylene Glycol-Glycerin (SOOTHE) 0.6-0.6 % SOLN Apply 1 drop to eye 3 (three) times daily.   sitaGLIPtin (JANUVIA) 100 MG tablet Take 100 mg by mouth daily.   traZODone  (DESYREL ) 50 MG tablet Take 25 mg by mouth at bedtime.   Zinc Oxide (TRIPLE PASTE) 12.8 % ointment Apply 1 Application topically daily.   No  facility-administered encounter medications on file as of 10/14/2024.    Review of Systems  Unable to perform ROS: Dementia    Immunization History  Administered Date(s) Administered   INFLUENZA, HIGH DOSE SEASONAL PF 07/09/2014, 07/23/2015, 06/02/2016, 07/02/2019, 07/13/2022   Influenza,inj,Quad PF,6+ Mos 06/23/2017, 07/17/2018   Influenza-Unspecified 07/23/2015, 06/22/2020, 07/08/2021, 07/20/2023, 07/09/2024   Moderna Covid-19 Fall Seasonal Vaccine 27yrs & older 01/12/2023, 01/05/2024   Moderna Covid-19 Vaccine  Bivalent Booster 89yrs & up 02/22/2022   Moderna Sars-Covid-2 Vaccination 10/08/2019, 11/05/2019, 07/24/2020, 02/28/2021, 08/05/2022   Pfizer Covid-19 Vaccine Bivalent Booster 15yrs & up 06/17/2021   Pneumococcal Conjugate-13 01/30/2014   Pneumococcal Polysaccharide-23 09/13/2004, 11/03/2016   RSV,unspecified 09/13/2023   Tdap 10/23/2012, 02/08/2023   Unspecified SARS-COV-2 Vaccination 06/23/2023, 07/19/2024   Zoster Recombinant(Shingrix) 11/19/2018, 04/11/2019   Zoster, Live 10/19/2006   Pertinent  Health Maintenance Due  Topic Date Due   OPHTHALMOLOGY EXAM  12/13/2024   HEMOGLOBIN A1C  01/26/2025   FOOT EXAM  05/17/2025   Influenza Vaccine  Completed   Bone Density Scan  Completed   Mammogram  Discontinued      08/03/2022    1:10 PM 02/28/2023    9:50 AM 03/19/2024    3:36 PM 07/30/2024   12:03 PM 08/28/2024   11:57 AM  Fall Risk  Falls in the past year? 1 0 1 1 1   Was there an injury with Fall? 0  0  0  0  0  Fall Risk Category Calculator 2 0 2 1 1   Fall Risk Category (Retired) Moderate       (RETIRED) Patient Fall Risk Level Moderate fall risk       Patient at Risk for Falls Due to History of fall(s)  History of fall(s);Impaired balance/gait;Impaired mobility History of fall(s);Impaired balance/gait History of fall(s);Impaired balance/gait  Fall risk Follow up Falls evaluation completed   Falls evaluation completed Falls evaluation completed Falls evaluation  completed     Data saved with a previous flowsheet row definition   Functional Status Survey:    Vitals:   10/14/24 1042  BP: (!) 107/58  Pulse: 82  Resp: 17  Temp: (!) 97.5 F (36.4 C)  SpO2: 95%  Weight: 157 lb 12.8 oz (71.6 kg)  Height: 5' 6 (1.676 m)   Body mass index is 25.47 kg/m. Physical Exam Vitals reviewed.  Constitutional:      General: She is not in acute distress.    Appearance: She is not ill-appearing.  HENT:     Head: Normocephalic.     Right Ear: There is no impacted cerumen.     Left Ear: There is no impacted cerumen.     Nose: Nose normal.     Mouth/Throat:     Mouth: Mucous membranes are moist.  Eyes:     General:        Right eye: No discharge.        Left eye: No discharge.  Cardiovascular:     Rate and Rhythm: Normal rate and regular rhythm.     Pulses: Normal pulses.     Heart sounds: Normal heart sounds.  Pulmonary:     Effort: Pulmonary effort is normal.     Breath sounds: Normal breath sounds.  Abdominal:     General: Bowel sounds are normal. There is no distension.     Palpations: Abdomen is soft.     Tenderness: There is no abdominal tenderness.  Musculoskeletal:     Cervical back: Neck supple.     Right lower leg: No edema.     Left lower leg: No edema.  Skin:    General: Skin is warm.     Capillary Refill: Capillary refill takes less than 2 seconds.  Neurological:     General: No focal deficit present.     Mental Status: She is alert. Mental status is at baseline.     Motor: Weakness present.     Gait: Gait abnormal.  Psychiatric:        Mood and Affect: Mood normal.     Labs reviewed: Recent Labs    01/29/24 0635 02/15/24 0000 07/10/24 1039 07/10/24 1041 07/29/24 0000  NA 143 142 141 142 145  K 3.8 3.6 4.0 4.2 3.7  CL 105 105 103 103 105  CO2 29 28* 25  --  30*  GLUCOSE 113*  --  149* 144*  --   BUN 12 12 12 15 18   CREATININE 0.79 0.7 0.67 0.70 0.6  CALCIUM 8.8 8.7 8.9  --  8.7   Recent Labs  02/15/24 0000 07/10/24 1039 07/29/24 0000  AST 17 28 22   ALT 20 45* 35  ALKPHOS 79 85 58  BILITOT  --  0.8  --   PROT  --  6.9  --   ALBUMIN 3.6 3.6 3.5   Recent Labs    01/29/24 0635 02/15/24 0000 03/19/24 0000 07/10/24 1039 07/10/24 1041 07/29/24 0000  WBC 8.9   < > 9.4 11.2*  --  7.8  NEUTROABS 7,102   < > 7,285.00 9.7*  --  5,873.00  HGB 11.1*   < > 11.3* 13.1 13.3 12.1  HCT 33.5*   < > 35* 39.7 39.0 37  MCV 86.8  --   --  87.8  --   --   PLT 222   < > 226 219  --  204   < > = values in this interval not displayed.   Lab Results  Component Value Date   TSH 2.930 04/04/2023   Lab Results  Component Value Date   HGBA1C 6.8 07/29/2024   Lab Results  Component Value Date   CHOL 167 02/13/2023   HDL 43 (L) 02/13/2023   LDLCALC 88 02/13/2023   LDLDIRECT 104.0 10/29/2021   TRIG 302 (H) 02/13/2023   CHOLHDL 3.9 02/13/2023    Significant Diagnostic Results in last 30 days:  No results found.  Assessment/Plan 1. Type 2 diabetes mellitus treated without insulin (HCC) (Primary) - recent A1c 6.8 - no hypoglycemia - eye exam UTD - cont Jardiance , Januvia and glimepiride   2. Pressure injury of sacral region, stage 2 (HCC) - improving - followed by wound> no changes 01/14 visit - cont air mattress, calcium alginate, proximel dressing   3. Seizure (HCC) - no recent episodes - cont Depakote   4. PAF (paroxysmal atrial fibrillation) (HCC) - HR< 100 with metoprolol  - cont Eliquis for clot prevention   5. Moderate vascular dementia with anxiety (HCC) - stable mood - no recent weight loss - dependent with ADLs  - hoyer transfer - cont Trazodone   6. Essential hypertension - controlled with metoprolol   7. Hyperthyroidism - TSH stable  8. Weight loss - improved with mirtazapine     Family/ staff Communication: plan discussed with patient and nurse  Labs/tests ordered:  none        [1]  Allergies Allergen Reactions   Penicillins Other (See  Comments)    Unknown reaction   Sulfa Antibiotics Itching and Rash   Zithromax [Azithromycin] Palpitations   "

## 2024-10-18 ENCOUNTER — Non-Acute Institutional Stay (SKILLED_NURSING_FACILITY): Payer: Self-pay | Admitting: Orthopedic Surgery

## 2024-10-18 ENCOUNTER — Encounter: Payer: Self-pay | Admitting: Orthopedic Surgery

## 2024-10-18 DIAGNOSIS — L89152 Pressure ulcer of sacral region, stage 2: Secondary | ICD-10-CM

## 2024-10-18 DIAGNOSIS — F03B4 Unspecified dementia, moderate, with anxiety: Secondary | ICD-10-CM | POA: Diagnosis not present

## 2024-10-18 NOTE — Progress Notes (Unsigned)
 " Location:  Other [place].  Nursing Home Room Number: [facility] [id number] Place of Service:  SNF [phone]) Provider:  Greig Cluster, NP  PCP: Laurence Locus, DO  Patient Care Team: Laurence Locus, DO as PCP - General (Internal Medicine) [name], MD as PCP - Cardiology (Cardiology) [name], MD as Consulting Physician (Ophthalmology)  Extended Emergency Contact Information Primary Emergency Contact: Coen,Fred Address: [address]          [place] [address] [place] Home Phone: [phone] Relation: Spouse Secondary Emergency Contact: Jones,Nancy Mobile Phone: [phone] Relation: Daughter Preferred language: English  Code Status:  DNR Goals of care: Advanced Directive information    08/28/2024    9:13 AM  Advanced Directives  Does Patient Have a Medical Advance Directive? Yes  Type of Advance Directive Living will;Out of facility DNR (pink MOST or yellow form)  Does patient want to make changes to medical advance directive? No - Patient declined     Chief Complaint  Patient presents with   Wound Check    Wound    HPI:  Pt is a 84 y.o. female seen today for acute visit due to sacral wound.   She currently resides on the skilled nursing unit at [facility]. PMH: atherosclerosis, HTN, PAF, T2DM, hyperthyroidism, meningioma, dementia, OA, malnutrition, retinal detachment, seizure and B12 deficiency.   Poor historian due to dementia. Nursing reports new onset slough to ongoing sacral wound. She is followed by wound. Remains on calcium alginate and proximel dressing, air mattress.    Past Medical History:  Diagnosis Date   Actinic keratosis 06/25/2020   L pretibia distal - bx proven    Balance problems    BCC (basal cell carcinoma of skin) 12/17/2020   right temple, Moh's 01/19/2021   Brain tumor (HCC)    a. Left intraventricular tumor ->stable by 06/2017 MRI. Followed @ [facility].   Dysplastic nevus 06/25/2020   R flank - moderate   Gait  disturbance    a. Unsteady on feet/balance difficulty.   Generalized osteoarthritis of multiple sites    GERD (gastroesophageal reflux disease)    History of DVT (deep vein thrombosis) 2016   estrogen and airflights. Rx with xarelto  for 3 months   History of stress test    a. 11/2016 ETT: No ST/T changes.  Performed to assess PAC/PVC burden with activity ->No ectopy noted.   Hypertension    Hyperthyroidism    treated briefly in college   ICH (intracerebral hemorrhage) (HCC) 07/10/2024   Insomnia    Obesity    PAF (paroxysmal atrial fibrillation) (HCC)    a. 12/2016 Event Monitor: brief run of PAF-->CHA2DS2VASc = 5--> Xarelto .   Plantar fasciitis    Retinal tear of left eye    Rosacea    SCC (squamous cell carcinoma) 08/12/2021   left chest treated with ED& C 04/06/22   Squamous cell carcinoma of skin 09/07/2017   R dorsum of hand    Type 2 diabetes, controlled, with peripheral neuropathy (HCC)    a. 11/2016 A1c 7.4.   Uterine fibroid    Past Surgical History:  Procedure Laterality Date   CARPAL TUNNEL RELEASE Right    Dr Sissy   CATARACT EXTRACTION     Gamma knife  07/2020   intraventricular mass---meningioma or choroid plexus papilloma   RETINAL TEAR REPAIR CRYOTHERAPY     10/09/13, then again 3/15    Allergies[1]  Outpatient Encounter Medications as of 10/18/2024  Medication Sig  acetaminophen  (TYLENOL ) 325 MG tablet Take 650 mg by mouth 3 (three) times daily. max of 3000mg  of acetaminophen  per 24 hours, from all sources   apixaban (ELIQUIS) 5 MG TABS tablet Take 5 mg by mouth 2 (two) times daily.   divalproex  (DEPAKOTE ) 125 MG DR tablet Take 2 tablets (250 mg total) by mouth 2 (two) times daily.   empagliflozin  (JARDIANCE ) 25 MG TABS tablet Take 1 tablet (25 mg total) by mouth daily before breakfast.   glimepiride  (AMARYL ) 2 MG tablet Take 2 mg by mouth daily.   hydrocortisone (ANUSOL-HC) 2.5 % rectal cream Place 1 Application rectally See admin instructions. Apply to  hemorrhoids topically every 6 hours as needed for hemorrhoids after bowel movement.   losartan  (COZAAR ) 100 MG tablet Take 100 mg by mouth daily.   metoprolol  succinate (TOPROL -XL) 25 MG 24 hr tablet Take 1 tablet (25 mg total) by mouth daily.   mirtazapine (REMERON) 15 MG tablet Take 15 mg by mouth at bedtime.   Multiple Vitamins-Minerals (MULTIVITAMIN WITH MINERALS) tablet Take 1 tablet by mouth daily.   Nutritional Supplements (NUTRITIONAL DRINK) LIQD Take 1 Container by mouth 3 (three) times daily. Med[employer]   polyethylene glycol (MIRALAX / GLYCOLAX) 17 g packet Take 17 g by mouth at bedtime.   Propylene Glycol-Glycerin (SOOTHE) 0.6-0.6 % SOLN Apply 1 drop to eye 3 (three) times daily.   sitaGLIPtin (JANUVIA) 100 MG tablet Take 100 mg by mouth daily.   traZODone  (DESYREL ) 50 MG tablet Take 25 mg by mouth at bedtime.   Zinc Oxide (TRIPLE PASTE) 12.8 % ointment Apply 1 Application topically daily.   No facility-administered encounter medications on file as of 10/18/2024.    Review of Systems  Unable to perform ROS: Dementia    Immunization History  Administered Date(s) Administered   INFLUENZA, HIGH DOSE SEASONAL PF 07/09/2014, 07/23/2015, 06/02/2016, 07/02/2019, 07/13/2022   Influenza,inj,Quad PF,6+ Mos 06/23/2017, 07/17/2018   Influenza-Unspecified 07/23/2015, 06/22/2020, 07/08/2021, 07/20/2023, 07/09/2024   Moderna Covid-19 Fall Seasonal Vaccine 42yrs & older 01/12/2023, 01/05/2024   Moderna Covid-19 Vaccine  Bivalent Booster 17yrs & up 02/22/2022   Moderna Sars-Covid-2 Vaccination 10/08/2019, 11/05/2019, 07/24/2020, 02/28/2021, 08/05/2022   Pfizer Covid-19 Vaccine Bivalent Booster 19yrs & up 06/17/2021   Pneumococcal Conjugate-13 01/30/2014   Pneumococcal Polysaccharide-23 09/13/2004, 11/03/2016   RSV,unspecified 09/13/2023   Tdap 10/23/2012, 02/08/2023   Unspecified SARS-COV-2 Vaccination 06/23/2023, 07/19/2024   Zoster Recombinant(Shingrix) 11/19/2018, 04/11/2019   Zoster, Live  10/19/2006   Pertinent  Health Maintenance Due  Topic Date Due   OPHTHALMOLOGY EXAM  12/13/2024   HEMOGLOBIN A1C  01/26/2025   FOOT EXAM  05/17/2025   Influenza Vaccine  Completed   Bone Density Scan  Completed   Mammogram  Discontinued      08/03/2022    1:10 PM 02/28/2023    9:50 AM 03/19/2024    3:36 PM 07/30/2024   12:03 PM 08/28/2024   11:57 AM  Fall Risk  Falls in the past year? 1 0 1 1 1   Was there an injury with Fall? 0  0  0  0  0  Fall Risk Category Calculator 2 0 2 1 1   Fall Risk Category (Retired) Moderate       (RETIRED) Patient Fall Risk Level Moderate fall risk       Patient at Risk for Falls Due to History of fall(s)  History of fall(s);Impaired balance/gait;Impaired mobility History of fall(s);Impaired balance/gait History of fall(s);Impaired balance/gait  Fall risk Follow up Falls evaluation completed   Falls evaluation  completed Falls evaluation completed Falls evaluation completed     Data saved with a previous flowsheet row definition   Functional Status Survey:    Vitals:   10/18/24 1135  BP: 136/75  Pulse: 90  Resp: 17  Temp: 98.1 F (36.7 C)  SpO2: 99%  Weight: 157 lb 12.8 oz (71.6 kg)  Height: 5' 6 (1.676 m)   Body mass index is 25.47 kg/m. Physical Exam Vitals reviewed.  Constitutional:      General: She is not in acute distress. HENT:     Head: Normocephalic.  Eyes:     General:        Right eye: No discharge.        Left eye: No discharge.  Cardiovascular:     Rate and Rhythm: Normal rate and regular rhythm.     Pulses: Normal pulses.     Heart sounds: Normal heart sounds.  Pulmonary:     Effort: Pulmonary effort is normal.     Breath sounds: Normal breath sounds.  Abdominal:     General: Bowel sounds are normal.     Palpations: Abdomen is soft.  Musculoskeletal:     Cervical back: Neck supple.     Right lower leg: No edema.     Left lower leg: No edema.  Skin:    Capillary Refill: Capillary refill takes less than 2 seconds.      Comments: Stage II PU to sacrum 1.5 x 1 x 0.2 cm, slough to wound bed, no odor, surrounding skin blanchable  Neurological:     Mental Status: She is alert.     Motor: Weakness present.     Gait: Gait abnormal.  Psychiatric:     Comments: Nonverbal, does not follow commands     Labs reviewed: Recent Labs    01/29/24 0635 02/15/24 0000 07/10/24 1039 07/10/24 1041 07/29/24 0000  NA 143 142 141 142 145  K 3.8 3.6 4.0 4.2 3.7  CL 105 105 103 103 105  CO2 29 28* 25  --  30*  GLUCOSE 113*  --  149* 144*  --   BUN 12 12 12 15 18   CREATININE 0.79 0.7 0.67 0.70 0.6  CALCIUM 8.8 8.7 8.9  --  8.7   Recent Labs    02/15/24 0000 07/10/24 1039 07/29/24 0000  AST 17 28 22   ALT 20 45* 35  ALKPHOS 79 85 58  BILITOT  --  0.8  --   PROT  --  6.9  --   ALBUMIN 3.6 3.6 3.5   Recent Labs    01/29/24 0635 02/15/24 0000 03/19/24 0000 07/10/24 1039 07/10/24 1041 07/29/24 0000  WBC 8.9   < > 9.4 11.2*  --  7.8  NEUTROABS 7,102   < > 7,285.00 9.7*  --  5,873.00  HGB 11.1*   < > 11.3* 13.1 13.3 12.1  HCT 33.5*   < > 35* 39.7 39.0 37  MCV 86.8  --   --  87.8  --   --   PLT 222   < > 226 219  --  204   < > = values in this interval not displayed.   Lab Results  Component Value Date   TSH 2.930 04/04/2023   Lab Results  Component Value Date   HGBA1C 6.8 07/29/2024   Lab Results  Component Value Date   CHOL 167 02/13/2023   HDL 43 (L) 02/13/2023   LDLCALC 88 02/13/2023   LDLDIRECT 104.0 10/29/2021  TRIG 302 (H) 02/13/2023   CHOLHDL 3.9 02/13/2023    Significant Diagnostic Results in last 30 days:  No results found.  Assessment/Plan 1. Pressure injury of sacral region, stage 2 (HCC) (Primary) - ongoing - followed by wound - new onset slough to wound bed> start Santly applications daily x 7 days - cont calcium alginate and proximel dressing - cont air mattress  2. Moderate dementia with anxiety, unspecified dementia type (HCC) - recent BIMS 0/15 - dependent  with ADLs  - followed by hospice  - weight down 3 lbs from last week  - cont skilled nursing     Family/ staff Communication: plan discussed with nurse  Labs/tests ordered:   none         [1]  Allergies Allergen Reactions   Penicillins Other (See Comments)    Unknown reaction   Sulfa Antibiotics Itching and Rash   Zithromax [Azithromycin] Palpitations   "

## 2024-10-22 ENCOUNTER — Telehealth: Payer: Self-pay | Admitting: *Deleted

## 2024-10-22 NOTE — Telephone Encounter (Signed)
 Copied from CRM [id number]. Topic: General - Deceased Patient >> 11/12/24 12:29 PM Graeme ORN wrote: Name of caller:  Ricka Pacini with [employer] [facility]   Date of death: November 12, 2024 at 1:30am -  [place]rem[place]   Name of funeral home: [facility]  Phone number of funeral home: [phone]  Provider that needs to sign form: Dr Laurence   Timeline for signing: In system ready to be signed as soon as possible

## 2024-10-27 DEATH — deceased
